# Patient Record
Sex: Male | Born: 1940 | Race: White | Hispanic: No | Smoking: Former smoker
Health system: Southern US, Community
[De-identification: ages and names within clinical notes are randomized; demographics above are authoritative.]

## PROBLEM LIST (undated history)

## (undated) DIAGNOSIS — G629 Polyneuropathy, unspecified: Secondary | ICD-10-CM

## (undated) DIAGNOSIS — E119 Type 2 diabetes mellitus without complications: Secondary | ICD-10-CM

## (undated) DIAGNOSIS — I739 Peripheral vascular disease, unspecified: Secondary | ICD-10-CM

## (undated) DIAGNOSIS — I441 Atrioventricular block, second degree: Secondary | ICD-10-CM

## (undated) DIAGNOSIS — E785 Hyperlipidemia, unspecified: Secondary | ICD-10-CM

## (undated) DIAGNOSIS — F329 Major depressive disorder, single episode, unspecified: Secondary | ICD-10-CM

## (undated) DIAGNOSIS — N400 Enlarged prostate without lower urinary tract symptoms: Secondary | ICD-10-CM

## (undated) DIAGNOSIS — N189 Chronic kidney disease, unspecified: Secondary | ICD-10-CM

## (undated) DIAGNOSIS — Z87440 Personal history of urinary (tract) infections: Secondary | ICD-10-CM

## (undated) DIAGNOSIS — R001 Bradycardia, unspecified: Secondary | ICD-10-CM

## (undated) DIAGNOSIS — M1990 Unspecified osteoarthritis, unspecified site: Secondary | ICD-10-CM

## (undated) DIAGNOSIS — N2581 Secondary hyperparathyroidism of renal origin: Secondary | ICD-10-CM

## (undated) DIAGNOSIS — E1121 Type 2 diabetes mellitus with diabetic nephropathy: Secondary | ICD-10-CM

## (undated) DIAGNOSIS — F32A Depression, unspecified: Secondary | ICD-10-CM

## (undated) DIAGNOSIS — M109 Gout, unspecified: Secondary | ICD-10-CM

## (undated) DIAGNOSIS — D631 Anemia in chronic kidney disease: Secondary | ICD-10-CM

## (undated) DIAGNOSIS — E079 Disorder of thyroid, unspecified: Secondary | ICD-10-CM

## (undated) DIAGNOSIS — E039 Hypothyroidism, unspecified: Secondary | ICD-10-CM

## (undated) HISTORY — DX: Atrioventricular block, second degree: I44.1

## (undated) HISTORY — DX: Unspecified osteoarthritis, unspecified site: M19.90

## (undated) HISTORY — DX: Secondary hyperparathyroidism of renal origin: N25.81

## (undated) HISTORY — DX: Chronic kidney disease, unspecified: N18.9

## (undated) HISTORY — PX: COLONOSCOPY: SHX174

## (undated) HISTORY — DX: Disorder of thyroid, unspecified: E07.9

## (undated) HISTORY — DX: Depression, unspecified: F32.A

## (undated) HISTORY — DX: Hyperlipidemia, unspecified: E78.5

## (undated) HISTORY — DX: Polyneuropathy, unspecified: G62.9

## (undated) HISTORY — DX: Bradycardia, unspecified: R00.1

## (undated) HISTORY — PX: EYE SURGERY: SHX253

## (undated) HISTORY — DX: Gout, unspecified: M10.9

## (undated) HISTORY — DX: Type 2 diabetes mellitus with diabetic nephropathy: E11.21

## (undated) HISTORY — PX: KNEE ARTHROSCOPY: SUR90

## (undated) HISTORY — DX: Type 2 diabetes mellitus without complications: E11.9

## (undated) HISTORY — DX: Hypothyroidism, unspecified: E03.9

## (undated) HISTORY — DX: Anemia in chronic kidney disease: D63.1

## (undated) HISTORY — DX: Peripheral vascular disease, unspecified: I73.9

## (undated) HISTORY — DX: Anemia in chronic kidney disease: N18.9

## (undated) HISTORY — DX: Personal history of urinary (tract) infections: Z87.440

---
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
## 2010-09-13 ENCOUNTER — Ambulatory Visit (HOSPITAL_COMMUNITY)
Admission: RE | Admit: 2010-09-13 | Discharge: 2010-09-13 | Payer: Self-pay | Source: Home / Self Care | Attending: Nephrology | Admitting: Nephrology

## 2011-02-09 ENCOUNTER — Ambulatory Visit: Payer: Self-pay | Admitting: Ophthalmology

## 2011-02-21 ENCOUNTER — Ambulatory Visit: Payer: Self-pay | Admitting: Ophthalmology

## 2012-05-15 DIAGNOSIS — N186 End stage renal disease: Secondary | ICD-10-CM | POA: Insufficient documentation

## 2012-05-15 DIAGNOSIS — N189 Chronic kidney disease, unspecified: Secondary | ICD-10-CM | POA: Insufficient documentation

## 2012-05-15 DIAGNOSIS — E039 Hypothyroidism, unspecified: Secondary | ICD-10-CM | POA: Insufficient documentation

## 2012-05-15 DIAGNOSIS — E875 Hyperkalemia: Secondary | ICD-10-CM | POA: Insufficient documentation

## 2012-05-15 DIAGNOSIS — H269 Unspecified cataract: Secondary | ICD-10-CM | POA: Insufficient documentation

## 2012-05-15 DIAGNOSIS — M109 Gout, unspecified: Secondary | ICD-10-CM | POA: Insufficient documentation

## 2012-05-15 DIAGNOSIS — G8929 Other chronic pain: Secondary | ICD-10-CM | POA: Insufficient documentation

## 2012-05-15 DIAGNOSIS — E119 Type 2 diabetes mellitus without complications: Secondary | ICD-10-CM | POA: Insufficient documentation

## 2012-05-15 DIAGNOSIS — E1122 Type 2 diabetes mellitus with diabetic chronic kidney disease: Secondary | ICD-10-CM | POA: Insufficient documentation

## 2012-05-15 HISTORY — DX: Unspecified cataract: H26.9

## 2012-07-11 ENCOUNTER — Other Ambulatory Visit: Payer: Self-pay | Admitting: Nephrology

## 2012-07-11 DIAGNOSIS — R748 Abnormal levels of other serum enzymes: Secondary | ICD-10-CM

## 2012-07-16 ENCOUNTER — Ambulatory Visit
Admission: RE | Admit: 2012-07-16 | Discharge: 2012-07-16 | Disposition: A | Discharge status: Home / Self Care | Payer: Medicare Other | Source: Ambulatory Visit | Attending: Nephrology | Admitting: Nephrology

## 2012-07-16 DIAGNOSIS — R748 Abnormal levels of other serum enzymes: Secondary | ICD-10-CM

## 2012-08-28 DIAGNOSIS — N529 Male erectile dysfunction, unspecified: Secondary | ICD-10-CM

## 2012-08-28 HISTORY — DX: Male erectile dysfunction, unspecified: N52.9

## 2012-10-17 DIAGNOSIS — E11319 Type 2 diabetes mellitus with unspecified diabetic retinopathy without macular edema: Secondary | ICD-10-CM | POA: Insufficient documentation

## 2012-10-29 DIAGNOSIS — M6208 Separation of muscle (nontraumatic), other site: Secondary | ICD-10-CM | POA: Insufficient documentation

## 2013-05-07 DIAGNOSIS — I10 Essential (primary) hypertension: Secondary | ICD-10-CM | POA: Insufficient documentation

## 2013-05-07 DIAGNOSIS — M1990 Unspecified osteoarthritis, unspecified site: Secondary | ICD-10-CM | POA: Insufficient documentation

## 2013-05-07 DIAGNOSIS — N186 End stage renal disease: Secondary | ICD-10-CM | POA: Insufficient documentation

## 2013-05-07 HISTORY — DX: Essential (primary) hypertension: I10

## 2013-09-17 DIAGNOSIS — F1190 Opioid use, unspecified, uncomplicated: Secondary | ICD-10-CM | POA: Insufficient documentation

## 2013-11-05 ENCOUNTER — Ambulatory Visit: Payer: Self-pay | Admitting: Podiatrist

## 2013-11-19 ENCOUNTER — Ambulatory Visit: Payer: Self-pay | Admitting: Podiatrist

## 2013-11-19 DIAGNOSIS — Z550 Illiteracy and low-level literacy: Secondary | ICD-10-CM | POA: Insufficient documentation

## 2013-11-19 HISTORY — DX: Illiteracy and low-level literacy: Z55.0

## 2013-11-26 ENCOUNTER — Ambulatory Visit (INDEPENDENT_AMBULATORY_CARE_PROVIDER_SITE_OTHER): Payer: Medicare Other | Admitting: Podiatrist

## 2013-11-26 ENCOUNTER — Encounter: Payer: Self-pay | Admitting: Podiatrist

## 2013-11-26 VITALS — BP 122/75 | HR 62 | Resp 16 | Ht 68.0 in | Wt 233.0 lb

## 2013-11-26 DIAGNOSIS — E1159 Type 2 diabetes mellitus with other circulatory complications: Secondary | ICD-10-CM

## 2013-11-26 DIAGNOSIS — M898X9 Other specified disorders of bone, unspecified site: Secondary | ICD-10-CM

## 2013-11-26 NOTE — Patient Instructions (Signed)
Diabetes and Foot Care Diabetes may cause you to have problems because of poor blood supply (circulation) to your feet and legs. This may cause the skin on your feet to become thinner, break easier, and heal more slowly. Your skin may become dry, and the skin may peel and crack. You may also have nerve damage in your legs and feet causing decreased feeling in them. You may not notice minor injuries to your feet that could lead to infections or more serious problems. Taking care of your feet is one of the most important things you can do for yourself.  HOME CARE INSTRUCTIONS  Wear shoes at all times, even in the house. Do not go barefoot. Bare feet are easily injured.  Check your feet daily for blisters, cuts, and redness. If you cannot see the bottom of your feet, use a mirror or ask someone for help.  Wash your feet with warm water (do not use hot water) and mild soap. Then pat your feet and the areas between your toes until they are completely dry. Do not soak your feet as this can dry your skin.  Apply a moisturizing lotion or petroleum jelly (that does not contain alcohol and is unscented) to the skin on your feet and to dry, brittle toenails. Do not apply lotion between your toes.  Trim your toenails straight across. Do not dig under them or around the cuticle. File the edges of your nails with an emery board or nail file.  Do not cut corns or calluses or try to remove them with medicine.  Wear clean socks or stockings every day. Make sure they are not too tight. Do not wear knee-high stockings since they may decrease blood flow to your legs.  Wear shoes that fit properly and have enough cushioning. To break in new shoes, wear them for just a few hours a day. This prevents you from injuring your feet. Always look in your shoes before you put them on to be sure there are no objects inside.  Do not cross your legs. This may decrease the blood flow to your feet.  If you find a minor scrape,  cut, or break in the skin on your feet, keep it and the skin around it clean and dry. These areas may be cleansed with mild soap and water. Do not cleanse the area with peroxide, alcohol, or iodine.  When you remove an adhesive bandage, be sure not to damage the skin around it.  If you have a wound, look at it several times a day to make sure it is healing.  Do not use heating pads or hot water bottles. They may burn your skin. If you have lost feeling in your feet or legs, you may not know it is happening until it is too late.  Make sure your health care provider performs a complete foot exam at least annually or more often if you have foot problems. Report any cuts, sores, or bruises to your health care provider immediately. SEEK MEDICAL CARE IF:   You have an injury that is not healing.  You have cuts or breaks in the skin.  You have an ingrown nail.  You notice redness on your legs or feet.  You feel burning or tingling in your legs or feet.  You have pain or cramps in your legs and feet.  Your legs or feet are numb.  Your feet always feel cold. SEEK IMMEDIATE MEDICAL CARE IF:   There is increasing redness,   swelling, or pain in or around a wound.  There is a red line that goes up your leg.  Pus is coming from a wound.  You develop a fever or as directed by your health care provider.  You notice a bad smell coming from an ulcer or wound. Document Released: 08/24/2000 Document Revised: 04/29/2013 Document Reviewed: 02/03/2013 Exit[employer] Patient Information 2014 [employer].  

## 2013-11-26 NOTE — Progress Notes (Signed)
   Subjective:    Patient ID: Frank [name], male    DOB: Dec 25, 1940, 73 y.o.   MRN: [id number]  HPI Comments: Diabetic foot care. Want to have the feet checked. My doctor suggested to get set up with a doctor.  Diabetic since 1990. Blood sugar this morning was 112.   Diabetes      Review of Systems  Endocrine:       Diabetic   Hematological: Bruises/bleeds easily.  All other systems reviewed and are negative.       Objective:   Physical Exam Vascular status intact at 2/4 pt bilateral, 1/4 dp bilateral.  Neurological sensation intact at 5/5 sites right and 4/5 sites left via SWMF 5.07 Hallux limitus noted bilateral.  Posterior calcaneal spurring is present bilateral with pump bumps/exostosis present bilateral.  Pre ulcerative callus submetatarsal 1 bilateral.  Left hallux nail has been permanently removed in the past.  All remaining  toenails are thick, discolored, dystrophic.  He trimmed them up last night and he has several areas where he got too close to the skin.  No signs of infection today but I do recommend them being trimmed in the future by a foot specialist.  Contracture of 2nd digit is noted bilateral Most recent ABI's by Dr. Lucky Cowboy 05/2012  1.36 left; 1.17 Right        Assessment & Plan:  Diabetes with early neuropathy, toenail deformity, hallux limitus, haglunds, pre ulcerative callus submet 1 left.  Recommended diabetic shoes with inserts to prevent ulceration.  Will return in 3 months for nail debridement and foot check

## 2015-02-10 DIAGNOSIS — M1A09X0 Idiopathic chronic gout, multiple sites, without tophus (tophi): Secondary | ICD-10-CM

## 2015-02-10 DIAGNOSIS — N5203 Combined arterial insufficiency and corporo-venous occlusive erectile dysfunction: Secondary | ICD-10-CM | POA: Insufficient documentation

## 2015-02-10 HISTORY — DX: Idiopathic chronic gout, multiple sites, without tophus (tophi): M1A.09X0

## 2015-08-01 DIAGNOSIS — E785 Hyperlipidemia, unspecified: Secondary | ICD-10-CM | POA: Insufficient documentation

## 2015-11-28 DIAGNOSIS — E1142 Type 2 diabetes mellitus with diabetic polyneuropathy: Secondary | ICD-10-CM | POA: Insufficient documentation

## 2015-11-28 DIAGNOSIS — Z01818 Encounter for other preprocedural examination: Secondary | ICD-10-CM | POA: Insufficient documentation

## 2016-07-04 DIAGNOSIS — M7989 Other specified soft tissue disorders: Secondary | ICD-10-CM | POA: Insufficient documentation

## 2016-08-13 DIAGNOSIS — M19071 Primary osteoarthritis, right ankle and foot: Secondary | ICD-10-CM | POA: Insufficient documentation

## 2016-10-18 DIAGNOSIS — L299 Pruritus, unspecified: Secondary | ICD-10-CM | POA: Insufficient documentation

## 2017-07-25 ENCOUNTER — Other Ambulatory Visit: Payer: Self-pay

## 2017-07-25 DIAGNOSIS — N185 Chronic kidney disease, stage 5: Secondary | ICD-10-CM

## 2017-07-25 DIAGNOSIS — Z01810 Encounter for preprocedural cardiovascular examination: Secondary | ICD-10-CM

## 2017-07-26 ENCOUNTER — Encounter: Payer: Self-pay | Admitting: Vascular Surgery

## 2017-09-11 ENCOUNTER — Other Ambulatory Visit (HOSPITAL_COMMUNITY): Payer: Medicare Other

## 2017-09-11 ENCOUNTER — Encounter: Payer: Medicare Other | Admitting: Vascular Surgery

## 2017-09-11 ENCOUNTER — Encounter (HOSPITAL_COMMUNITY): Payer: Medicare Other

## 2017-11-01 ENCOUNTER — Encounter: Payer: Self-pay | Admitting: Vascular Surgery

## 2017-11-01 ENCOUNTER — Other Ambulatory Visit: Payer: Self-pay | Admitting: *Deleted

## 2017-11-01 ENCOUNTER — Encounter: Payer: Self-pay | Admitting: *Deleted

## 2017-11-01 ENCOUNTER — Ambulatory Visit (HOSPITAL_COMMUNITY)
Admission: RE | Admit: 2017-11-01 | Discharge: 2017-11-01 | Disposition: A | Discharge status: Home / Self Care | Payer: Medicare Other | Source: Ambulatory Visit | Attending: Vascular Surgery | Admitting: Vascular Surgery

## 2017-11-01 ENCOUNTER — Ambulatory Visit (INDEPENDENT_AMBULATORY_CARE_PROVIDER_SITE_OTHER)
Admission: RE | Admit: 2017-11-01 | Discharge: 2017-11-01 | Disposition: A | Discharge status: Home / Self Care | Payer: Medicare Other | Source: Ambulatory Visit | Attending: Vascular Surgery | Admitting: Vascular Surgery

## 2017-11-01 ENCOUNTER — Ambulatory Visit (INDEPENDENT_AMBULATORY_CARE_PROVIDER_SITE_OTHER): Payer: Medicare Other | Admitting: Vascular Surgery

## 2017-11-01 VITALS — BP 145/71 | HR 74 | Resp 20 | Ht 68.0 in | Wt 192.4 lb

## 2017-11-01 DIAGNOSIS — I120 Hypertensive chronic kidney disease with stage 5 chronic kidney disease or end stage renal disease: Secondary | ICD-10-CM | POA: Insufficient documentation

## 2017-11-01 DIAGNOSIS — N185 Chronic kidney disease, stage 5: Secondary | ICD-10-CM | POA: Diagnosis not present

## 2017-11-01 DIAGNOSIS — Z01810 Encounter for preprocedural cardiovascular examination: Secondary | ICD-10-CM | POA: Diagnosis present

## 2017-11-01 DIAGNOSIS — N189 Chronic kidney disease, unspecified: Secondary | ICD-10-CM | POA: Diagnosis not present

## 2017-11-01 DIAGNOSIS — Z87891 Personal history of nicotine dependence: Secondary | ICD-10-CM | POA: Insufficient documentation

## 2017-11-01 NOTE — Progress Notes (Signed)
Patient ID: Frank [name], male   DOB: 1940/11/25, 77 y.o.   MRN: [id number]  Reason for Consult: New Patient (Initial Visit) (eval for AVF - ref by Dr. Justin Mend)   Referred by Glennie Hawk, MD  Subjective:     HPI:  Frank [name] is a 77 y.o. male presents for evaluation of dialysis access creation.  He is never been on dialysis before.  He does not have upper extremity surgery or pacemaker placement or central catheters.  He is right-handed.  He does not take blood thinners.  Past Medical History:  Diagnosis Date  . Anemia of chronic kidney failure   . Bradycardia   . Chronic kidney disease   . Diabetes mellitus without complication ([place])   . Diabetic nephropathy ([place])   . Gout   . Gout   . Hyperlipidemia   . Hypothyroidism   . Peripheral neuropathy   . Peripheral vascular disease ([place])   . Secondary hyperparathyroidism ([place])    Renal  . Thyroid disease    Family History  Problem Relation Age of Onset  . Diabetes Mother   . Kidney disease Mother   . Liver disease Mother   . Heart Problems Mother    Past Surgical History:  Procedure Laterality Date  . ARTHROPLASTY Left    Knee  . EYE SURGERY     cataract surgery    Short Social History:  Social History   Tobacco Use  . Smoking status: Former Smoker    Years: 30.00    Last attempt to quit: 1994    Years since quitting: 25.1  . Smokeless tobacco: Never Used  Substance Use Topics  . Alcohol use: No    Allergies  Allergen Reactions  . Other     Iv dyes     Current Outpatient Medications  Medication Sig Dispense Refill  . allopurinol (ZYLOPRIM) 100 MG tablet     . aspirin EC 81 MG tablet Take 81 mg daily by mouth.    Marland Kitchen atorvastatin (LIPITOR) 20 MG tablet Take 20 mg daily by mouth.    . cimetidine (TAGAMET) 200 MG tablet Take 200 mg 2 (two) times daily by mouth.    . furosemide (LASIX) 20 MG tablet     . gabapentin (NEURONTIN) 100 MG capsule Take 300 mg by mouth at bedtime as needed.     Marland Kitchen  HYDROcodone-acetaminophen (NORCO) 10-325 MG per tablet Take 0.5 tablets by mouth.     . levothyroxine (SYNTHROID, LEVOTHROID) 200 MCG tablet 224 mcg.     . multivitamin (RENA-VIT) TABS tablet Take 1 tablet daily by mouth.    . tamsulosin (FLOMAX) 0.4 MG CAPS capsule Take 0.4 mg by mouth.    Marland Kitchen atenolol (TENORMIN) 50 MG tablet     . colchicine 0.6 MG tablet Take 0.6 mg daily by mouth.    Marland Kitchen glimepiride (AMARYL) 4 MG tablet     . hydrOXYzine (ATARAX/VISTARIL) 25 MG tablet Take 25 mg 3 (three) times daily as needed by mouth.     No current facility-administered medications for this visit.     Review of Systems  Constitutional:  Constitutional negative. HENT: HENT negative.  Eyes: Eyes negative.  Respiratory: Respiratory negative.  Cardiovascular: Cardiovascular negative.  Skin: Skin negative.  Neurological: Neurological negative. Hematologic: Hematologic/lymphatic negative.  Psychiatric: Psychiatric negative.        Objective:  Objective   Vitals:   11/01/17 1004 11/01/17 1007  BP: (!) 150/79 (!) 145/71  Pulse: 74  Resp: 20   SpO2: 98%   Weight: 192 lb 6.4 oz (87.3 kg)   Height: 5\' 8"  (1.727 m)    Body mass index is 29.25 kg/m.  Physical Exam  Constitutional: He is oriented to person, place, and time. He appears well-developed.  HENT:  Head: Normocephalic.  Eyes: Pupils are equal, round, and reactive to light.  Neck: Normal range of motion. Neck supple.  Cardiovascular: Normal rate.  Pulses:      Carotid pulses are 2+ on the right side, and 2+ on the left side.      Radial pulses are 2+ on the right side, and 2+ on the left side.  Abdominal: Soft.  Musculoskeletal: Normal range of motion. He exhibits no edema.  Large appearing compressible cephalic vein in left forearm  Neurological: He is alert and oriented to person, place, and time.  Skin: Skin is warm and dry.  Psychiatric: He has a normal mood and affect. His behavior is normal. Thought content normal.     Data: I have interpreted his bilateral upper extremity arterial duplex which have triphasic waveforms bilaterally.  Brachial artery on the left is 0.5 cm and radial artery is 0.3 cm.  I have also interpreted his vein mapping which demonstrates suitable basilic vein on the left for fistula creation he would have a basilic or cephalic vein on the right.     Assessment/Plan:     77 year old male with chronic kidney disease here for evaluation placement dialysis access.  He does appear to have at least the basilic vein on the left which is his nondominant arm but also on physical exam appears to possibly have a forearm cephalic vein that could be used.  We will use ultrasound at the time of surgery to determine the best course of action but he does have very strong palpable brachial and radial pulses on the left arm.  He does demonstrate good understanding we will get him set up today.     [name] MD [facility]

## 2017-11-04 ENCOUNTER — Encounter: Payer: Self-pay | Admitting: Nephrology

## 2017-11-18 ENCOUNTER — Encounter (HOSPITAL_COMMUNITY): Payer: Self-pay | Admitting: *Deleted

## 2017-11-18 ENCOUNTER — Other Ambulatory Visit: Payer: Self-pay

## 2017-11-18 NOTE — Progress Notes (Signed)
Pre-op instructions give to patient, no further questions asked,  Instructed patient to check his sugar the morning of surgery and if less than 7- to treat with 1/2 cup of apple juice  Checks 1 daily at home fasting 100-115

## 2017-11-19 ENCOUNTER — Encounter (HOSPITAL_COMMUNITY)
Admission: RE | Disposition: A | Discharge status: Home / Self Care | Payer: Self-pay | Source: Ambulatory Visit | Attending: Vascular Surgery

## 2017-11-19 ENCOUNTER — Ambulatory Visit (HOSPITAL_COMMUNITY)
Admission: RE | Admit: 2017-11-19 | Discharge: 2017-11-19 | Disposition: A | Discharge status: Home / Self Care | Payer: Medicare Other | Source: Ambulatory Visit | Attending: Vascular Surgery | Admitting: Vascular Surgery

## 2017-11-19 ENCOUNTER — Other Ambulatory Visit: Payer: Self-pay

## 2017-11-19 ENCOUNTER — Ambulatory Visit (HOSPITAL_COMMUNITY): Payer: Medicare Other | Admitting: Anesthesiology

## 2017-11-19 ENCOUNTER — Encounter (HOSPITAL_COMMUNITY): Payer: Self-pay | Admitting: *Deleted

## 2017-11-19 DIAGNOSIS — I4510 Unspecified right bundle-branch block: Secondary | ICD-10-CM | POA: Diagnosis not present

## 2017-11-19 DIAGNOSIS — N185 Chronic kidney disease, stage 5: Secondary | ICD-10-CM

## 2017-11-19 DIAGNOSIS — E1122 Type 2 diabetes mellitus with diabetic chronic kidney disease: Secondary | ICD-10-CM | POA: Diagnosis not present

## 2017-11-19 DIAGNOSIS — Z7984 Long term (current) use of oral hypoglycemic drugs: Secondary | ICD-10-CM | POA: Diagnosis not present

## 2017-11-19 DIAGNOSIS — E785 Hyperlipidemia, unspecified: Secondary | ICD-10-CM | POA: Insufficient documentation

## 2017-11-19 DIAGNOSIS — E211 Secondary hyperparathyroidism, not elsewhere classified: Secondary | ICD-10-CM | POA: Insufficient documentation

## 2017-11-19 DIAGNOSIS — Z79899 Other long term (current) drug therapy: Secondary | ICD-10-CM | POA: Diagnosis not present

## 2017-11-19 DIAGNOSIS — M109 Gout, unspecified: Secondary | ICD-10-CM | POA: Diagnosis not present

## 2017-11-19 DIAGNOSIS — N189 Chronic kidney disease, unspecified: Secondary | ICD-10-CM | POA: Insufficient documentation

## 2017-11-19 DIAGNOSIS — E039 Hypothyroidism, unspecified: Secondary | ICD-10-CM | POA: Diagnosis not present

## 2017-11-19 DIAGNOSIS — E1140 Type 2 diabetes mellitus with diabetic neuropathy, unspecified: Secondary | ICD-10-CM | POA: Diagnosis not present

## 2017-11-19 DIAGNOSIS — E1151 Type 2 diabetes mellitus with diabetic peripheral angiopathy without gangrene: Secondary | ICD-10-CM | POA: Insufficient documentation

## 2017-11-19 DIAGNOSIS — I129 Hypertensive chronic kidney disease with stage 1 through stage 4 chronic kidney disease, or unspecified chronic kidney disease: Secondary | ICD-10-CM | POA: Diagnosis not present

## 2017-11-19 DIAGNOSIS — D631 Anemia in chronic kidney disease: Secondary | ICD-10-CM | POA: Diagnosis not present

## 2017-11-19 DIAGNOSIS — Z7982 Long term (current) use of aspirin: Secondary | ICD-10-CM | POA: Diagnosis not present

## 2017-11-19 DIAGNOSIS — Z87891 Personal history of nicotine dependence: Secondary | ICD-10-CM | POA: Insufficient documentation

## 2017-11-19 DIAGNOSIS — Z91041 Radiographic dye allergy status: Secondary | ICD-10-CM | POA: Insufficient documentation

## 2017-11-19 HISTORY — PX: AV FISTULA PLACEMENT: SHX1204

## 2017-11-19 LAB — POCT I-STAT 4, (NA,K, GLUC, HGB,HCT)
Glucose, Bld: 114 mg/dL — ABNORMAL HIGH (ref 65–99)
HCT: 32 % — ABNORMAL LOW (ref 39.0–52.0)
Hemoglobin: 10.9 g/dL — ABNORMAL LOW (ref 13.0–17.0)
Potassium: 3.5 mmol/L (ref 3.5–5.1)
Sodium: 142 mmol/L (ref 135–145)

## 2017-11-19 LAB — HEMOGLOBIN A1C
Hgb A1c MFr Bld: 6.1 % — ABNORMAL HIGH (ref 4.8–5.6)
Mean Plasma Glucose: 128.37 mg/dL

## 2017-11-19 SURGERY — ARTERIOVENOUS (AV) FISTULA CREATION
Anesthesia: Monitor Anesthesia Care | Site: Arm Lower | Laterality: Left

## 2017-11-19 MED ORDER — DEXAMETHASONE SODIUM PHOSPHATE 10 MG/ML IJ SOLN
INTRAMUSCULAR | Status: AC
  Filled 2017-11-19: qty 1

## 2017-11-19 MED ORDER — 0.9 % SODIUM CHLORIDE (POUR BTL) OPTIME
TOPICAL | Status: DC | PRN
  Administered 2017-11-19: 1000 mL

## 2017-11-19 MED ORDER — CHLORHEXIDINE GLUCONATE 4 % EX LIQD: 60.0000 mL | Freq: Once | CUTANEOUS | Status: DC

## 2017-11-19 MED ORDER — DEXAMETHASONE SODIUM PHOSPHATE 4 MG/ML IJ SOLN
INTRAMUSCULAR | Status: DC | PRN
  Administered 2017-11-19: 4 mg via INTRAVENOUS

## 2017-11-19 MED ORDER — TRAMADOL HCL 50 MG PO TABS: 50.0000 mg | ORAL_TABLET | Freq: Four times a day (QID) | ORAL | 0 refills | Status: DC | PRN

## 2017-11-19 MED ORDER — LIDOCAINE-EPINEPHRINE (PF) 1 %-1:200000 IJ SOLN
INTRAMUSCULAR | Status: DC | PRN
  Administered 2017-11-19: 30 mL

## 2017-11-19 MED ORDER — CEFAZOLIN SODIUM-DEXTROSE 2-4 GM/100ML-% IV SOLN
INTRAVENOUS | Status: AC
  Filled 2017-11-19: qty 100

## 2017-11-19 MED ORDER — CEFAZOLIN SODIUM-DEXTROSE 2-4 GM/100ML-% IV SOLN
2.0000 g | INTRAVENOUS | Status: AC
  Administered 2017-11-19: 2 g via INTRAVENOUS

## 2017-11-19 MED ORDER — PROPOFOL 10 MG/ML IV BOLUS
INTRAVENOUS | Status: AC
  Filled 2017-11-19: qty 20

## 2017-11-19 MED ORDER — PROPOFOL 500 MG/50ML IV EMUL
INTRAVENOUS | Status: DC | PRN
  Administered 2017-11-19: 75 ug/kg/min via INTRAVENOUS

## 2017-11-19 MED ORDER — ONDANSETRON HCL 4 MG/2ML IJ SOLN
INTRAMUSCULAR | Status: DC | PRN
  Administered 2017-11-19: 4 mg via INTRAVENOUS

## 2017-11-19 MED ORDER — ONDANSETRON HCL 4 MG/2ML IJ SOLN
INTRAMUSCULAR | Status: AC
  Filled 2017-11-19: qty 2

## 2017-11-19 MED ORDER — MIDAZOLAM HCL 5 MG/5ML IJ SOLN
INTRAMUSCULAR | Status: DC | PRN
  Administered 2017-11-19 (×2): 1 mg via INTRAVENOUS

## 2017-11-19 MED ORDER — MIDAZOLAM HCL 2 MG/2ML IJ SOLN
INTRAMUSCULAR | Status: AC
  Filled 2017-11-19: qty 2

## 2017-11-19 MED ORDER — FENTANYL CITRATE (PF) 250 MCG/5ML IJ SOLN
INTRAMUSCULAR | Status: AC
  Filled 2017-11-19: qty 5

## 2017-11-19 MED ORDER — FENTANYL CITRATE (PF) 100 MCG/2ML IJ SOLN
INTRAMUSCULAR | Status: DC | PRN
  Administered 2017-11-19: 50 ug via INTRAVENOUS

## 2017-11-19 MED ORDER — SODIUM CHLORIDE 0.9 % IV SOLN
INTRAVENOUS | Status: DC
  Administered 2017-11-19: 07:00:00 via INTRAVENOUS

## 2017-11-19 MED ORDER — HEPARIN SODIUM (PORCINE) 5000 UNIT/ML IJ SOLN
INTRAMUSCULAR | Status: DC | PRN
  Administered 2017-11-19: 07:00:00

## 2017-11-19 MED ORDER — LIDOCAINE-EPINEPHRINE (PF) 1 %-1:200000 IJ SOLN
INTRAMUSCULAR | Status: AC
  Filled 2017-11-19: qty 30

## 2017-11-19 SURGICAL SUPPLY — 29 items
ARMBAND PINK RESTRICT EXTREMIT (MISCELLANEOUS) ×2 IMPLANT
CANISTER SUCT 3000ML PPV (MISCELLANEOUS) ×2 IMPLANT
CLIP VESOCCLUDE MED 6/CT (CLIP) ×2 IMPLANT
CLIP VESOCCLUDE SM WIDE 6/CT (CLIP) ×2 IMPLANT
COVER PROBE W GEL 5X96 (DRAPES) ×2 IMPLANT
DERMABOND ADVANCED (GAUZE/BANDAGES/DRESSINGS) ×1
DERMABOND ADVANCED .7 DNX12 (GAUZE/BANDAGES/DRESSINGS) ×1 IMPLANT
ELECT REM PT RETURN 9FT ADLT (ELECTROSURGICAL) ×2
ELECTRODE REM PT RTRN 9FT ADLT (ELECTROSURGICAL) ×1 IMPLANT
GLOVE BIO SURGEON STRL SZ7 (GLOVE) ×4 IMPLANT
GLOVE BIO SURGEON STRL SZ7.5 (GLOVE) ×2 IMPLANT
GLOVE BIOGEL PI IND STRL 6.5 (GLOVE) ×1 IMPLANT
GLOVE BIOGEL PI INDICATOR 6.5 (GLOVE) ×1
GOWN STRL REUS W/ TWL LRG LVL3 (GOWN DISPOSABLE) IMPLANT
GOWN STRL REUS W/ TWL XL LVL3 (GOWN DISPOSABLE) ×4 IMPLANT
GOWN STRL REUS W/TWL LRG LVL3 (GOWN DISPOSABLE)
GOWN STRL REUS W/TWL XL LVL3 (GOWN DISPOSABLE) ×4
KIT BASIN OR (CUSTOM PROCEDURE TRAY) ×2 IMPLANT
KIT ROOM TURNOVER OR (KITS) ×2 IMPLANT
NS IRRIG 1000ML POUR BTL (IV SOLUTION) ×2 IMPLANT
PACK CV ACCESS (CUSTOM PROCEDURE TRAY) ×2 IMPLANT
PAD ARMBOARD 7.5X6 YLW CONV (MISCELLANEOUS) ×4 IMPLANT
SUT MNCRL AB 4-0 PS2 18 (SUTURE) ×2 IMPLANT
SUT PROLENE 6 0 BV (SUTURE) ×2 IMPLANT
SUT VIC AB 3-0 SH 27 (SUTURE) ×1
SUT VIC AB 3-0 SH 27X BRD (SUTURE) ×1 IMPLANT
TOWEL GREEN STERILE (TOWEL DISPOSABLE) ×2 IMPLANT
UNDERPAD 30X30 (UNDERPADS AND DIAPERS) ×2 IMPLANT
WATER STERILE IRR 1000ML POUR (IV SOLUTION) ×2 IMPLANT

## 2017-11-19 NOTE — Anesthesia Preprocedure Evaluation (Addendum)
Anesthesia Evaluation  Patient identified by MRN, date of birth, ID band Patient awake    Reviewed: Allergy & Precautions, NPO status   History of Anesthesia Complications Negative for: history of anesthetic complications  Airway Mallampati: II  TM Distance: >3 FB Neck ROM: Full    Dental  (+) Upper Dentures, Lower Dentures, Dental Advisory Given   Pulmonary former smoker,    breath sounds clear to auscultation       Cardiovascular + Peripheral Vascular Disease   Rhythm:Regular Rate:Normal     Neuro/Psych  Neuromuscular disease    GI/Hepatic negative GI ROS, Neg liver ROS,   Endo/Other  diabetesHypothyroidism   Renal/GU ESRFRenal disease     Musculoskeletal   Abdominal   Peds  Hematology   Anesthesia Other Findings   Reproductive/Obstetrics                           Anesthesia Physical Anesthesia Plan  ASA: III  Anesthesia Plan: MAC   Post-op Pain Management:    Induction:   PONV Risk Score and Plan: 2 and Treatment may vary due to age or medical condition, Dexamethasone and Ondansetron  Airway Management Planned: Simple Face Mask and Natural Airway  Additional Equipment:   Intra-op Plan:   Post-operative Plan:   Informed Consent: I have reviewed the patients History and Physical, chart, labs and discussed the procedure including the risks, benefits and alternatives for the proposed anesthesia with the patient or authorized representative who has indicated his/her understanding and acceptance.     Plan Discussed with: CRNA  Anesthesia Plan Comments:         Anesthesia Quick Evaluation

## 2017-11-19 NOTE — Anesthesia Procedure Notes (Signed)
Procedure Name: MAC Date/Time: 11/19/2017 7:40 AM Performed by: Jenne Campus, CRNA Pre-anesthesia Checklist: Patient identified, Emergency Drugs available, Suction available and Patient being monitored Oxygen Delivery Method: Simple face mask

## 2017-11-19 NOTE — Op Note (Signed)
    Patient name: Frank [name] MRN: [id number] DOB: 04/02/1941 Sex: male  11/19/2017 Pre-operative Diagnosis: chronic kidney disease Post-operative diagnosis:  Same Surgeon:  Eda Paschal. Donzetta Matters, MD Assistant: Arlee Muslim, PA Procedure Performed: Left radiocephalic avf  Indications: 77 year old male has chronic kidney disease now indicated for permanent access.  He has vein mapping demonstrating suitable vein on his entire right upper extremity and basilic vein on the left.  Physical exam demonstrates likely cephalic vein in the forearm for usable fistula creation.  Findings: The cephalic vein was 3 mm throughout the forearm although does dive deep into the antecubitum.  The radial artery was 3 mm external diameter.  Following fistula creation at the wrist there was a strong thrill palpable in the distal half of the forearm.   Procedure:  The patient was identified in the holding area and taken to the operating room where he was placed supine on the operating table and MAC anesthesia was induced.  He was sterilely prepped and draped in the left arm in the usual fashion given antibiotics and timeout was called.  We used ultrasound to evaluate his cephalic vein throughout his forearm which appeared adequate for fistula creation.  1% lidocaine with epinephrine was then instilled around the radial artery at the wrist as well as the cephalic vein and a longitudinal incision was made between both the vein and artery.  We first dissected out the vein and marked it for orientation.  We then turned our attention divided the deep fascia and placed a vessel loop around the artery.  The vein was then transected distally dilated up to 4 mm easily flushed with heparinized saline.  The artery was then clamped distally and proximally opened longitudinally and flushed with heparinized saline both directions.  The vein was then sewn end-to-side to the artery with 6-0 Prolene suture.  Prior to completing we allowed  flushing maneuvers in all directions.  Upon completion there was a thrill confirmed with Doppler throughout the vein we could traced up to the near the antecubitum with palpation and Doppler.  I mobilized the vein within the wound and did clip an extra branch.  The vein maintained strong thrill.  Wound was irrigated closed in 2 layers with Vicryl Monocryl.  Dermabond was placed level the skin.  Patient tolerated procedure well without immediate complication.  All counts were correct at completion.  Next  EBL 10 cc.   Ocean Schildt C. Donzetta Matters, MD [facility] [place] Office: [phone] Pager: [phone]

## 2017-11-19 NOTE — Transfer of Care (Signed)
Immediate Anesthesia Transfer of Care Note  Patient: Frank [name]  Procedure(s) Performed: ARTERIOVENOUS (AV) FISTULA CREATION (Left Arm Lower)  Patient Location: PACU  Anesthesia Type:MAC  Level of Consciousness: awake, oriented and patient cooperative  Airway & Oxygen Therapy: Patient Spontanous Breathing and Patient connected to nasal cannula oxygen  Post-op Assessment: Report given to RN and Post -op Vital signs reviewed and stable  Post vital signs: Reviewed  Last Vitals:  Vitals:   11/19/17 0626  BP: (!) 137/56  Pulse: 65  Resp: 20  Temp: 36.7 C  SpO2: 98%    Last Pain:  Vitals:   11/19/17 0626  TempSrc: Oral      Patients Stated Pain Goal: 2 (29/47/65 4650)  Complications: No apparent anesthesia complications

## 2017-11-19 NOTE — Anesthesia Postprocedure Evaluation (Signed)
Anesthesia Post Note  Patient: Frank [name]  Procedure(s) Performed: ARTERIOVENOUS (AV) FISTULA CREATION (Left Arm Lower)     Patient location during evaluation: PACU Anesthesia Type: MAC Level of consciousness: awake and alert Pain management: pain level controlled Vital Signs Assessment: post-procedure vital signs reviewed and stable Respiratory status: spontaneous breathing, nonlabored ventilation, respiratory function stable and patient connected to nasal cannula oxygen Cardiovascular status: stable and blood pressure returned to baseline Postop Assessment: no apparent nausea or vomiting Anesthetic complications: no    Last Vitals:  Vitals:   11/19/17 0915 11/19/17 0925  BP:  119/63  Pulse: (!) 59 61  Resp: 14 16  Temp:    SpO2: 97% 98%    Last Pain:  Vitals:   11/19/17 0925  TempSrc:   PainSc: 0-No pain                 Trystin Hargrove,JAMES TERRILL

## 2017-11-19 NOTE — Discharge Instructions (Signed)
° °  [facility] [place] ° °Discharge Instructions ° °AV Fistula or Graft Surgery for Dialysis Access ° °Please refer to the following instructions for your post-procedure care. Your surgeon or physician assistant will discuss any changes with you. ° °Activity ° °You may drive the day following your surgery, if you are comfortable and no longer taking prescription pain medication. Resume full activity as the soreness in your incision resolves. ° °Bathing/Showering ° °You may shower after you go home. Keep your incision dry for 48 hours. Do not soak in a bathtub, hot tub, or swim until the incision heals completely. You may not shower if you have a hemodialysis catheter. ° °Incision Care ° °Clean your incision with mild soap and water after 48 hours. Pat the area dry with a clean towel. You do not need a bandage unless otherwise instructed. Do not apply any ointments or creams to your incision. You may have skin glue on your incision. Do not peel it off. It will come off on its own in about one week. Your arm may swell a bit after surgery. To reduce swelling use pillows to elevate your arm so it is above your heart. Your doctor will tell you if you need to lightly wrap your arm with an ACE bandage. ° °Diet ° °Resume your normal diet. There are not special food restrictions following this procedure. In order to heal from your surgery, it is CRITICAL to get adequate nutrition. Your body requires vitamins, minerals, and protein. Vegetables are the best source of vitamins and minerals. Vegetables also provide the perfect balance of protein. Processed food has little nutritional value, so try to avoid this. ° °Medications ° °Resume taking all of your medications. If your incision is causing pain, you may take over-the counter pain relievers such as acetaminophen (Tylenol). If you were prescribed a stronger pain medication, please be aware these medications can cause nausea and constipation. Prevent  nausea by taking the medication with a snack or meal. Avoid constipation by drinking plenty of fluids and eating foods with high amount of fiber, such as fruits, vegetables, and grains. Do not take Tylenol if you are taking prescription pain medications. ° ° ° ° °Follow up °Your surgeon may want to see you in the office following your access surgery. If so, this will be arranged at the time of your surgery. ° °Please call us immediately for any of the following conditions: ° °Increased pain, redness, drainage (pus) from your incision site °Fever of 101 degrees or higher °Severe or worsening pain at your incision site °Hand pain or numbness. ° °Reduce your risk of vascular disease: ° °Stop smoking. If you would like help, call [facility] at 1-800-QUIT-NOW (1-800-784-8669) or [place] at 336-586-4000 ° °Manage your cholesterol °Maintain a desired weight °Control your diabetes °Keep your blood pressure down ° °Dialysis ° °It will take several weeks to several months for your new dialysis access to be ready for use. Your surgeon will determine when it is OK to use it. Your nephrologist will continue to direct your dialysis. You can continue to use your Permcath until your new access is ready for use. ° °If you have any questions, please call the office at [phone]. ° °

## 2017-11-19 NOTE — H&P (Signed)
   History and Physical Update  The patient was interviewed and re-examined.  The patient's previous History and Physical has been reviewed and is unchanged from recent office visit. Plan for left arm avf, will determine vein to be used with [place] in the OR.   Breon Diss C. Donzetta Matters, MD [facility] [place] Office: [phone] Pager: [phone]   11/19/2017, 7:26 AM

## 2017-11-20 ENCOUNTER — Telehealth: Payer: Self-pay | Admitting: Vascular Surgery

## 2017-11-20 ENCOUNTER — Encounter (HOSPITAL_COMMUNITY): Payer: Self-pay | Admitting: Vascular Surgery

## 2017-11-20 NOTE — Telephone Encounter (Signed)
Spoke to pt and confirmed appt. Mailed letter. °

## 2017-11-20 NOTE — Telephone Encounter (Signed)
-----   Message from Mena Goes, RN sent at 11/19/2017  8:44 AM EDT ----- Regarding: 4-6 weeks with duplex   ----- Message ----- From: Iline Oven Sent: 11/19/2017   8:30 AM To: [facility] Charge Pool  Can you schedule this pt for fistula duplex and to see Dr. Donzetta Matters in about 4-6 weeks.  PO L radiocephalic. Thanks, [facility]

## 2017-12-10 ENCOUNTER — Other Ambulatory Visit: Payer: Self-pay

## 2017-12-10 DIAGNOSIS — N185 Chronic kidney disease, stage 5: Secondary | ICD-10-CM

## 2017-12-18 ENCOUNTER — Ambulatory Visit (INDEPENDENT_AMBULATORY_CARE_PROVIDER_SITE_OTHER): Payer: Medicare Other | Admitting: Internal Medicine

## 2017-12-18 ENCOUNTER — Encounter: Payer: Self-pay | Admitting: Internal Medicine

## 2017-12-18 VITALS — BP 132/70 | HR 74 | Ht 68.0 in | Wt 193.0 lb

## 2017-12-18 DIAGNOSIS — R9431 Abnormal electrocardiogram [ECG] [EKG]: Secondary | ICD-10-CM | POA: Diagnosis not present

## 2017-12-18 DIAGNOSIS — I440 Atrioventricular block, first degree: Secondary | ICD-10-CM | POA: Diagnosis not present

## 2017-12-18 NOTE — Patient Instructions (Signed)

## 2017-12-18 NOTE — Progress Notes (Signed)
Electrophysiology Office Note   Date:  12/18/2017   ID:  Frank [name], DOB October 07, 1940, MRN [id number]  PCP:  Glennie Hawk, MD  Cardiologist:  none Primary Electrophysiologist: Thompson Grayer, MD    CC: AV block   History of Present Illness: Frank [name] is a 77 y.o. male who presents today for electrophysiology evaluation.   He is self referred for further evaluation of first degree AV block.  He has chronic renal disease and recently had fistula placed in anticipating of HD.  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 He is asymptomatic. Today, he denies symptoms of palpitations, chest pain, shortness of breath, orthopnea, PND, lower extremity edema, claudication, dizziness, presyncope, syncope, bleeding, or neurologic sequela. The patient is tolerating medications without difficulties and is otherwise without complaint today.    Past Medical History:  Diagnosis Date  . Anemia of chronic kidney failure   . Bradycardia   . Chronic kidney disease   . Diabetes mellitus without complication (HCC)    diet controlled   . Diabetic nephropathy ([place])   . Gout   . Gout   . Hyperlipidemia   . Hypothyroidism   . Peripheral neuropathy   . Peripheral vascular disease ([place])   . Secondary hyperparathyroidism ([place])    Renal  . Thyroid disease    Past Surgical History:  Procedure Laterality Date  . AV FISTULA PLACEMENT Left 11/19/2017   Procedure: ARTERIOVENOUS (AV) FISTULA CREATION;  Surgeon: [name], MD;  Location: [facility];  Service: Vascular;  Laterality: Left;  . COLONOSCOPY    . EYE SURGERY     cataract surgery  . KNEE ARTHROSCOPY Left    Knee     Current Outpatient Medications  Medication Sig Dispense Refill  . allopurinol (ZYLOPRIM) 100 MG tablet Take 100 mg by mouth daily.     Marland Kitchen aspirin EC 81 MG tablet Take 81 mg daily by mouth.    Marland Kitchen atorvastatin (LIPITOR) 20 MG tablet Take 20 mg daily by mouth.     . cimetidine (TAGAMET) 200 MG tablet Take 200 mg by mouth daily as needed (for pain).     . furosemide (LASIX) 20 MG tablet Take 20 mg by mouth daily.     Marland Kitchen gabapentin (NEURONTIN) 100 MG capsule Take 300 mg by mouth every evening.     Marland Kitchen HYDROcodone-acetaminophen (NORCO) 10-325 MG per tablet Take 0.5 tablets by mouth every 4 (four) hours as needed for moderate pain.     Marland Kitchen levothyroxine (SYNTHROID, LEVOTHROID) 112 MCG tablet Take 224 mcg by mouth daily before breakfast.     . multivitamin (RENA-VIT) TABS tablet Take 1 tablet daily by mouth.    . tamsulosin (FLOMAX) 0.4 MG CAPS capsule Take 0.4 mg by mouth daily.     . Zinc Oxide (BALMEX EX) Apply 1 application topically daily.     No current facility-administered medications for this visit.     Allergies:   Ace inhibitors; Angiotensin receptor blockers; Iodine-131; and Other   Social History:  The patient  reports that he quit smoking about 25 years ago. He quit after 30.00 years of use. He has never used smokeless tobacco. He reports that he does not drink alcohol or use drugs.   Family History:  The patient's  family history includes Diabetes in his mother; Heart Problems in his mother; Kidney disease in his mother; Liver disease in his mother.    ROS:  Please see the history of present illness.   All other systems are personally reviewed and negative.    PHYSICAL EXAM: VS:  BP 132/70   Pulse 74   Ht 5\' 8"  (1.727 m)   Wt 193 lb (87.5 kg)   BMI 29.35 kg/m  , BMI Body mass index is 29.35 kg/m. GEN: Well nourished, well developed, in no acute distress  HEENT: normal  Neck: no JVD, carotid bruits, or masses Cardiac: RRR; no murmurs, rubs, or gallops,no edema  Respiratory:  clear to auscultation bilaterally, normal work of breathing GI: soft, nontender, nondistended, + BS MS: no deformity or atrophy  Skin: warm and dry  Neuro:  Strength and sensation are intact Psych: euthymic mood, full affect  EKG:  EKG is ordered  today. The ekg ordered today is personally reviewed and shows sinus rhythm, PR 368 msec, septal infarct   Recent Labs: 11/19/2017: Hemoglobin 10.9; Potassium 3.5; Sodium 142  personally reviewed   Lipid Panel  No results found for: CHOL, TRIG, HDL, CHOLHDL, VLDL, LDLCALC, LDLDIRECT personally reviewed   Wt Readings from Last 3 Encounters:  12/18/17 193 lb (87.5 kg)  11/19/17 192 lb (87.1 kg)  11/01/17 192 lb 6.4 oz (87.3 kg)      Other studies personally reviewed: Additional studies/ records that were reviewed today include: ekgs from 2004- present in [place] are personally reviewed.  Echo, dobutamine echo 2017 are reviewed    ASSESSMENT AND PLAN:  1.  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 Asymptomatic No indication for pacing at this time. We discussed symptoms/ signs of AV block for which he should report immediately to the hospital (abrupt reduction in heart rate, dizziness, presyncope, syncope, etc). For now, watchful waiting is advised Avoid av nodal agents   Follow-up:  1 year  Current medicines are reviewed at length with the patient today.   The patient does not have concerns regarding his medicines.  The following changes were made today:  none   Signed, Thompson Grayer, MD  12/18/2017 9:59 AM     [facility] [address] [place] [address] [phone] (office) [phone] (fax)

## 2017-12-20 ENCOUNTER — Ambulatory Visit (INDEPENDENT_AMBULATORY_CARE_PROVIDER_SITE_OTHER): Payer: Medicare Other | Admitting: Vascular Surgery

## 2017-12-20 ENCOUNTER — Ambulatory Visit (HOSPITAL_COMMUNITY)
Admission: RE | Admit: 2017-12-20 | Discharge: 2017-12-20 | Disposition: A | Discharge status: Home / Self Care | Payer: Medicare Other | Source: Ambulatory Visit | Attending: Vascular Surgery | Admitting: Vascular Surgery

## 2017-12-20 ENCOUNTER — Other Ambulatory Visit: Payer: Self-pay

## 2017-12-20 ENCOUNTER — Encounter: Payer: Self-pay | Admitting: Vascular Surgery

## 2017-12-20 VITALS — BP 151/67 | HR 63 | Temp 97.0°F | Resp 16 | Ht 68.0 in | Wt 195.0 lb

## 2017-12-20 DIAGNOSIS — N185 Chronic kidney disease, stage 5: Secondary | ICD-10-CM | POA: Diagnosis present

## 2017-12-20 NOTE — Progress Notes (Signed)
Subjective:     Patient ID: Frank [name], male   DOB: 28-Jan-1941, 77 y.o.   MRN: [id number]  HPI 77 year old male follows up after left sided radiocephalic AV fistula creation.  He is doing very well aside from a little bit of numbness in his left thumb.  He has not used his hand much.  He has no wounds on his hand and his surgical wound has healed.   Review of Systems Numbness dorsum of left thumb    Objective:   Physical Exam Awake alert and oriented Left grip strength 5 out of 5 Strong thrill in AV fistula Palpable ulnar pulse on the left  I reviewed his dialysis access duplex today which demonstrates flow volumes greater than 600 the fistula is just at 0.5 cm throughout most the forearm.    Assessment/plan     77 year old male follows up status post left radiocephalic AV fistula creation.  At this time it is not mature enough to use yet but he is not on dialysis and hopefully with time he will continue to mature.  Flow volumes are very strong greater than 600 and suggest that this will be usable.  He can follow-up on a as needed basis.  Tykeria Wawrzyniak C. Donzetta Matters, MD [facility] [place] Office: [phone] Pager: [phone]

## 2018-01-10 ENCOUNTER — Ambulatory Visit: Payer: Medicare Other | Admitting: Vascular Surgery

## 2018-01-10 ENCOUNTER — Encounter (HOSPITAL_COMMUNITY): Payer: Medicare Other

## 2018-05-02 ENCOUNTER — Other Ambulatory Visit: Payer: Self-pay

## 2018-05-02 ENCOUNTER — Encounter: Payer: Self-pay | Admitting: Vascular Surgery

## 2018-05-02 ENCOUNTER — Ambulatory Visit (INDEPENDENT_AMBULATORY_CARE_PROVIDER_SITE_OTHER): Payer: Medicare Other | Admitting: Vascular Surgery

## 2018-05-02 ENCOUNTER — Other Ambulatory Visit: Payer: Self-pay | Admitting: Vascular Surgery

## 2018-05-02 VITALS — BP 139/69 | HR 76 | Resp 20 | Ht 68.0 in | Wt 192.6 lb

## 2018-05-02 DIAGNOSIS — N185 Chronic kidney disease, stage 5: Secondary | ICD-10-CM

## 2018-05-02 NOTE — Progress Notes (Signed)
Patient ID: Frank [name], male   DOB: [birth year], 77 y.o.   MRN: [id number]  Reason for Consult: Follow-up (eval weak thrill - Dr. Justin Mend - No HD)   Referred by Glennie Hawk, MD  Subjective:     HPI:  Frank [name] is a 77 y.o. male with chronic kidney disease whom I recently placed a left radiocephalic AV fistula in.  At last follow-up it had failed to fully mature but he was not requiring dialysis and we are letting further maturity occur.  He now sent back for evaluation as has a weak thrill.  Still has not started dialysis.  Does not take blood thinners.  Takes aspirin daily.  No pain in his hand or left upper extremity.  Past Medical History:  Diagnosis Date  . Anemia of chronic kidney failure   . Bradycardia   . Chronic kidney disease   . Diabetes mellitus without complication (HCC)    diet controlled   . Diabetic nephropathy ([place])   . Gout   . Gout   . Hyperlipidemia   . Hypothyroidism   . Peripheral neuropathy   . Peripheral vascular disease ([place])   . Secondary hyperparathyroidism ([place])    Renal  . Thyroid disease    Family History  Problem Relation Age of Onset  . Diabetes Mother   . Kidney disease Mother   . Liver disease Mother   . Heart Problems Mother    Past Surgical History:  Procedure Laterality Date  . AV FISTULA PLACEMENT Left 11/19/2017   Procedure: ARTERIOVENOUS (AV) FISTULA CREATION;  Surgeon: [name], MD;  Location: [facility];  Service: Vascular;  Laterality: Left;  . COLONOSCOPY    . EYE SURGERY     cataract surgery  . KNEE ARTHROSCOPY Left    Knee    Short Social History:  Social History   Tobacco Use  . Smoking status: Former Smoker    Years: 30.00    Last attempt to quit: 1994    Years since quitting: 25.6  . Smokeless tobacco: Never Used  Substance Use Topics  . Alcohol use: No    Allergies  Allergen Reactions  . Ace Inhibitors Other (See Comments)    Hyperkalemia on Ace  Other reaction(s): Other (See  Comments) Hyperkalemia on Ace Cr>1.9  . Angiotensin Receptor Blockers     Other reaction(s): Other (See Comments) Other reaction(s): Other (See Comments) Hyperkalemia Hyperkalemia   . Iodine-131 Other (See Comments)    Cant have due to renal failure  . Other Other (See Comments)    Iv dyes  Iv dyes     Current Outpatient Medications  Medication Sig Dispense Refill  . allopurinol (ZYLOPRIM) 100 MG tablet Take 100 mg by mouth daily.     Marland Kitchen aspirin EC 81 MG tablet Take 81 mg daily by mouth.    Marland Kitchen atorvastatin (LIPITOR) 20 MG tablet Take 20 mg daily by mouth.    . cimetidine (TAGAMET) 200 MG tablet Take 200 mg by mouth daily as needed (for pain).     . furosemide (LASIX) 20 MG tablet Take 20 mg by mouth daily.     Marland Kitchen gabapentin (NEURONTIN) 100 MG capsule Take 300 mg by mouth every evening.     Marland Kitchen HYDROcodone-acetaminophen (NORCO) 10-325 MG per tablet Take 0.5 tablets by mouth every 4 (four) hours as needed for moderate pain.     Marland Kitchen levothyroxine (SYNTHROID, LEVOTHROID) 112 MCG tablet Take 224 mcg by mouth daily before breakfast.     .  multivitamin (RENA-VIT) TABS tablet Take 1 tablet daily by mouth.    . tamsulosin (FLOMAX) 0.4 MG CAPS capsule Take 0.4 mg by mouth daily.     . Zinc Oxide (BALMEX EX) Apply 1 application topically daily.     No current facility-administered medications for this visit.     Review of Systems  Constitutional:  Constitutional negative. HENT: HENT negative.  Eyes: Eyes negative.  Respiratory: Respiratory negative.  Cardiovascular: Cardiovascular negative.  Skin: Skin negative.  Hematologic: Hematologic/lymphatic negative.        Objective:  Objective   Vitals:   05/02/18 1110  BP: 139/69  Pulse: 76  Resp: 20  SpO2: 98%  Weight: 192 lb 9.6 oz (87.4 kg)  Height: 5\' 8"  (1.727 m)   Body mass index is 29.28 kg/m.  Physical Exam  Constitutional: He is oriented to person, place, and time. He appears well-developed.  HENT:  Head: Normocephalic.    Eyes: Pupils are equal, round, and reactive to light.  Cardiovascular: Normal rate.  Pulses:      Radial pulses are 2+ on the right side, and 2+ on the left side.  Pulmonary/Chest: Effort normal.  Abdominal: Soft.  Musculoskeletal: Normal range of motion.  Left upper extremity with fistula that is pulsatile  Neurological: He is alert and oriented to person, place, and time.  Skin: Skin is warm and dry.  Psychiatric: He has a normal mood and affect. His behavior is normal. Judgment and thought content normal.    Data: I evaluated his left upper extremity with ultrasound.  The cephalic vein in the arm appears to become sclerotic towards the antecubitum.  Above the antecubitum there is a basilic vein that is quite large there is no identifiable cephalic vein.  The brachial artery appears to be free of disease.     Assessment/Plan:     77 year old male follows up after left radiocephalic AV fistula that has failed to mature.  By my ultrasound evaluation at bedside today.  It is quite sclerotic near the antecubitum.  I think his best option for permanent access will be to convert him to an upper arm basilic vein fistula which could either be done as a single or as 2 stages and I discussed with him moving forward and making a decision at the time of surgery whether or not it would be 1 or 2 surgeries moving forward.  He demonstrates good understanding we will get him set up today for at least a first stage basilic vein transposition fistula on the left.     [name] MD [facility]

## 2018-05-23 ENCOUNTER — Other Ambulatory Visit: Payer: Self-pay | Admitting: *Deleted

## 2018-06-03 ENCOUNTER — Encounter (HOSPITAL_COMMUNITY): Payer: Self-pay | Admitting: *Deleted

## 2018-06-03 NOTE — Progress Notes (Signed)
Mr Frank [name] denies chest pain or shortness of breath.  Patient has Type II diabetes, diet controlled. Mr Frank [name] reports that CBG runs 100-120.  I instructed patient to check CBG after awaking and every 2 hours until arrival  to the hospital.  I Instructed patient if CBG is less than 70 to drink  1/2 cup of a clear juice. Recheck CBG in 15 minutes then call pre- op desk at [phone] for further instructions.

## 2018-06-03 NOTE — Anesthesia Preprocedure Evaluation (Addendum)
Anesthesia Evaluation  Patient identified by MRN, date of birth, ID band Patient awake    Reviewed: Allergy & Precautions, NPO status , Patient's Chart, lab work & pertinent test results  History of Anesthesia Complications Negative for: history of anesthetic complications  Airway Mallampati: II  TM Distance: >3 FB Neck ROM: Full   Comment: Full beard Dental  (+) Edentulous Lower, Edentulous Upper, Dental Advisory Given   Pulmonary former smoker,    breath sounds clear to auscultation       Cardiovascular hypertension, + Peripheral Vascular Disease   Rhythm:Regular Rate:Normal     Neuro/Psych  Neuromuscular disease negative psych ROS   GI/Hepatic negative GI ROS, Neg liver ROS,   Endo/Other  diabetes, Type 2Hypothyroidism   Renal/GU CRFRenal disease  negative genitourinary   Musculoskeletal  (+) Arthritis ,  Gout    Abdominal   Peds  Hematology  (+) anemia ,   Anesthesia Other Findings   Reproductive/Obstetrics                           Anesthesia Physical Anesthesia Plan  ASA: IV  Anesthesia Plan: MAC   Post-op Pain Management:    Induction: Intravenous  PONV Risk Score and Plan: 1 and Propofol infusion and Treatment may vary due to age or medical condition  Airway Management Planned: Nasal Cannula and Natural Airway  Additional Equipment: None  Intra-op Plan:   Post-operative Plan:   Informed Consent: I have reviewed the patients History and Physical, chart, labs and discussed the procedure including the risks, benefits and alternatives for the proposed anesthesia with the patient or authorized representative who has indicated his/her understanding and acceptance.     Plan Discussed with: CRNA and Anesthesiologist  Anesthesia Plan Comments:        Anesthesia Quick Evaluation

## 2018-06-04 ENCOUNTER — Ambulatory Visit (HOSPITAL_COMMUNITY): Payer: Medicare Other | Admitting: Anesthesiology

## 2018-06-04 ENCOUNTER — Encounter (HOSPITAL_COMMUNITY): Payer: Self-pay

## 2018-06-04 ENCOUNTER — Telehealth: Payer: Self-pay | Admitting: Vascular Surgery

## 2018-06-04 ENCOUNTER — Encounter (HOSPITAL_COMMUNITY)
Admission: RE | Disposition: A | Discharge status: Home / Self Care | Payer: Self-pay | Source: Ambulatory Visit | Attending: Vascular Surgery

## 2018-06-04 ENCOUNTER — Ambulatory Visit (HOSPITAL_COMMUNITY)
Admission: RE | Admit: 2018-06-04 | Discharge: 2018-06-04 | Disposition: A | Discharge status: Home / Self Care | Payer: Medicare Other | Source: Ambulatory Visit | Attending: Vascular Surgery | Admitting: Vascular Surgery

## 2018-06-04 ENCOUNTER — Other Ambulatory Visit: Payer: Self-pay

## 2018-06-04 DIAGNOSIS — N189 Chronic kidney disease, unspecified: Secondary | ICD-10-CM | POA: Insufficient documentation

## 2018-06-04 DIAGNOSIS — E039 Hypothyroidism, unspecified: Secondary | ICD-10-CM | POA: Insufficient documentation

## 2018-06-04 DIAGNOSIS — M109 Gout, unspecified: Secondary | ICD-10-CM | POA: Diagnosis not present

## 2018-06-04 DIAGNOSIS — Z7982 Long term (current) use of aspirin: Secondary | ICD-10-CM | POA: Insufficient documentation

## 2018-06-04 DIAGNOSIS — M1990 Unspecified osteoarthritis, unspecified site: Secondary | ICD-10-CM | POA: Diagnosis not present

## 2018-06-04 DIAGNOSIS — N185 Chronic kidney disease, stage 5: Secondary | ICD-10-CM

## 2018-06-04 DIAGNOSIS — E1142 Type 2 diabetes mellitus with diabetic polyneuropathy: Secondary | ICD-10-CM | POA: Insufficient documentation

## 2018-06-04 DIAGNOSIS — E785 Hyperlipidemia, unspecified: Secondary | ICD-10-CM | POA: Diagnosis not present

## 2018-06-04 DIAGNOSIS — E11319 Type 2 diabetes mellitus with unspecified diabetic retinopathy without macular edema: Secondary | ICD-10-CM | POA: Diagnosis not present

## 2018-06-04 DIAGNOSIS — R600 Localized edema: Secondary | ICD-10-CM | POA: Insufficient documentation

## 2018-06-04 DIAGNOSIS — Z79899 Other long term (current) drug therapy: Secondary | ICD-10-CM | POA: Insufficient documentation

## 2018-06-04 DIAGNOSIS — N529 Male erectile dysfunction, unspecified: Secondary | ICD-10-CM | POA: Insufficient documentation

## 2018-06-04 DIAGNOSIS — I129 Hypertensive chronic kidney disease with stage 1 through stage 4 chronic kidney disease, or unspecified chronic kidney disease: Secondary | ICD-10-CM | POA: Insufficient documentation

## 2018-06-04 DIAGNOSIS — Z79891 Long term (current) use of opiate analgesic: Secondary | ICD-10-CM | POA: Diagnosis not present

## 2018-06-04 DIAGNOSIS — Z79890 Hormone replacement therapy (postmenopausal): Secondary | ICD-10-CM | POA: Insufficient documentation

## 2018-06-04 DIAGNOSIS — Z9889 Other specified postprocedural states: Secondary | ICD-10-CM | POA: Diagnosis not present

## 2018-06-04 HISTORY — DX: Polyneuropathy, unspecified: G62.9

## 2018-06-04 HISTORY — PX: BASCILIC VEIN TRANSPOSITION: SHX5742

## 2018-06-04 LAB — POCT I-STAT 4, (NA,K, GLUC, HGB,HCT)
Glucose, Bld: 94 mg/dL (ref 70–99)
HCT: 35 % — ABNORMAL LOW (ref 39.0–52.0)
Hemoglobin: 11.9 g/dL — ABNORMAL LOW (ref 13.0–17.0)
Potassium: 3.1 mmol/L — ABNORMAL LOW (ref 3.5–5.1)
Sodium: 138 mmol/L (ref 135–145)

## 2018-06-04 LAB — HEMOGLOBIN A1C
Hgb A1c MFr Bld: 5.9 % — ABNORMAL HIGH (ref 4.8–5.6)
Mean Plasma Glucose: 122.63 mg/dL

## 2018-06-04 LAB — GLUCOSE, CAPILLARY: Glucose-Capillary: 116 mg/dL — ABNORMAL HIGH (ref 70–99)

## 2018-06-04 SURGERY — TRANSPOSITION, VEIN, BASILIC
Anesthesia: Monitor Anesthesia Care | Site: Arm Upper | Laterality: Left

## 2018-06-04 MED ORDER — SODIUM CHLORIDE 0.9 % IV SOLN
INTRAVENOUS | Status: AC
  Filled 2018-06-04: qty 1.2

## 2018-06-04 MED ORDER — SODIUM CHLORIDE 0.9 % IV SOLN
INTRAVENOUS | Status: DC
  Administered 2018-06-04: 07:00:00 via INTRAVENOUS

## 2018-06-04 MED ORDER — SODIUM CHLORIDE 0.9 % IV SOLN
INTRAVENOUS | Status: DC | PRN
  Administered 2018-06-04: 07:00:00

## 2018-06-04 MED ORDER — HYDROCODONE-ACETAMINOPHEN 5-325 MG PO TABS: 1.0000 | ORAL_TABLET | Freq: Four times a day (QID) | ORAL | 0 refills | Status: DC | PRN

## 2018-06-04 MED ORDER — PROPOFOL 10 MG/ML IV BOLUS
INTRAVENOUS | Status: AC
  Filled 2018-06-04: qty 20

## 2018-06-04 MED ORDER — CHLORHEXIDINE GLUCONATE CLOTH 2 % EX PADS: 6.0000 | MEDICATED_PAD | Freq: Once | CUTANEOUS | Status: DC

## 2018-06-04 MED ORDER — LIDOCAINE 2% (20 MG/ML) 5 ML SYRINGE
INTRAMUSCULAR | Status: AC
  Filled 2018-06-04: qty 5

## 2018-06-04 MED ORDER — ONDANSETRON HCL 4 MG/2ML IJ SOLN
INTRAMUSCULAR | Status: AC
  Filled 2018-06-04: qty 2

## 2018-06-04 MED ORDER — SODIUM CHLORIDE 0.9 % IV SOLN: INTRAVENOUS | Status: DC

## 2018-06-04 MED ORDER — PHENYLEPHRINE 40 MCG/ML (10ML) SYRINGE FOR IV PUSH (FOR BLOOD PRESSURE SUPPORT)
PREFILLED_SYRINGE | INTRAVENOUS | Status: DC | PRN
  Administered 2018-06-04 (×4): 80 ug via INTRAVENOUS

## 2018-06-04 MED ORDER — CEFAZOLIN SODIUM-DEXTROSE 2-4 GM/100ML-% IV SOLN
2.0000 g | INTRAVENOUS | Status: AC
  Administered 2018-06-04: 2 g via INTRAVENOUS
  Filled 2018-06-04: qty 100

## 2018-06-04 MED ORDER — PROPOFOL 500 MG/50ML IV EMUL
INTRAVENOUS | Status: DC | PRN
  Administered 2018-06-04: 75 ug/kg/min via INTRAVENOUS

## 2018-06-04 MED ORDER — MIDAZOLAM HCL 5 MG/5ML IJ SOLN
INTRAMUSCULAR | Status: DC | PRN
  Administered 2018-06-04: 2 mg via INTRAVENOUS

## 2018-06-04 MED ORDER — LIDOCAINE-EPINEPHRINE (PF) 1 %-1:200000 IJ SOLN
INTRAMUSCULAR | Status: AC
  Filled 2018-06-04: qty 30

## 2018-06-04 MED ORDER — PHENYLEPHRINE 40 MCG/ML (10ML) SYRINGE FOR IV PUSH (FOR BLOOD PRESSURE SUPPORT)
PREFILLED_SYRINGE | INTRAVENOUS | Status: AC
  Filled 2018-06-04: qty 10

## 2018-06-04 MED ORDER — MIDAZOLAM HCL 2 MG/2ML IJ SOLN
INTRAMUSCULAR | Status: AC
  Filled 2018-06-04: qty 2

## 2018-06-04 MED ORDER — ONDANSETRON HCL 4 MG/2ML IJ SOLN
INTRAMUSCULAR | Status: DC | PRN
  Administered 2018-06-04: 4 mg via INTRAVENOUS

## 2018-06-04 MED ORDER — LIDOCAINE-EPINEPHRINE (PF) 1 %-1:200000 IJ SOLN
INTRAMUSCULAR | Status: DC | PRN
  Administered 2018-06-04 (×2): 30 mL

## 2018-06-04 MED ORDER — FENTANYL CITRATE (PF) 250 MCG/5ML IJ SOLN
INTRAMUSCULAR | Status: AC
  Filled 2018-06-04: qty 5

## 2018-06-04 MED ORDER — HEPARIN SODIUM (PORCINE) 1000 UNIT/ML IJ SOLN
INTRAMUSCULAR | Status: AC
  Filled 2018-06-04: qty 1

## 2018-06-04 MED ORDER — PROTAMINE SULFATE 10 MG/ML IV SOLN
INTRAVENOUS | Status: AC
  Filled 2018-06-04: qty 5

## 2018-06-04 MED ORDER — 0.9 % SODIUM CHLORIDE (POUR BTL) OPTIME
TOPICAL | Status: DC | PRN
  Administered 2018-06-04: 1000 mL

## 2018-06-04 MED ORDER — ARTIFICIAL TEARS OPHTHALMIC OINT
TOPICAL_OINTMENT | OPHTHALMIC | Status: AC
  Filled 2018-06-04: qty 3.5

## 2018-06-04 SURGICAL SUPPLY — 36 items
ARMBAND PINK RESTRICT EXTREMIT (MISCELLANEOUS) ×3 IMPLANT
CANISTER SUCT 3000ML PPV (MISCELLANEOUS) ×3 IMPLANT
CLIP VESOCCLUDE MED 24/CT (CLIP) ×3 IMPLANT
CLIP VESOCCLUDE SM WIDE 24/CT (CLIP) ×3 IMPLANT
COVER PROBE W GEL 5X96 (DRAPES) ×3 IMPLANT
DERMABOND ADVANCED (GAUZE/BANDAGES/DRESSINGS) ×2
DERMABOND ADVANCED .7 DNX12 (GAUZE/BANDAGES/DRESSINGS) ×1 IMPLANT
ELECT REM PT RETURN 9FT ADLT (ELECTROSURGICAL) ×3
ELECTRODE REM PT RTRN 9FT ADLT (ELECTROSURGICAL) ×1 IMPLANT
GLOVE BIO SURGEON STRL SZ7.5 (GLOVE) ×9 IMPLANT
GLOVE BIOGEL PI IND STRL 6.5 (GLOVE) ×1 IMPLANT
GLOVE BIOGEL PI IND STRL 7.0 (GLOVE) ×1 IMPLANT
GLOVE BIOGEL PI IND STRL 7.5 (GLOVE) ×1 IMPLANT
GLOVE BIOGEL PI IND STRL 8 (GLOVE) ×1 IMPLANT
GLOVE BIOGEL PI INDICATOR 6.5 (GLOVE) ×2
GLOVE BIOGEL PI INDICATOR 7.0 (GLOVE) ×2
GLOVE BIOGEL PI INDICATOR 7.5 (GLOVE) ×2
GLOVE BIOGEL PI INDICATOR 8 (GLOVE) ×2
GLOVE ECLIPSE 6.5 STRL STRAW (GLOVE) ×3 IMPLANT
GOWN STRL REUS W/ TWL LRG LVL3 (GOWN DISPOSABLE) ×2 IMPLANT
GOWN STRL REUS W/ TWL XL LVL3 (GOWN DISPOSABLE) ×2 IMPLANT
GOWN STRL REUS W/TWL LRG LVL3 (GOWN DISPOSABLE) ×4
GOWN STRL REUS W/TWL XL LVL3 (GOWN DISPOSABLE) ×4
KIT BASIN OR (CUSTOM PROCEDURE TRAY) ×3 IMPLANT
KIT TURNOVER KIT B (KITS) ×3 IMPLANT
NS IRRIG 1000ML POUR BTL (IV SOLUTION) ×3 IMPLANT
PACK CV ACCESS (CUSTOM PROCEDURE TRAY) ×3 IMPLANT
PAD ARMBOARD 7.5X6 YLW CONV (MISCELLANEOUS) ×6 IMPLANT
SUT MNCRL AB 4-0 PS2 18 (SUTURE) ×3 IMPLANT
SUT PROLENE 6 0 BV (SUTURE) ×3 IMPLANT
SUT SILK 2 0 SH (SUTURE) IMPLANT
SUT VIC AB 3-0 SH 27 (SUTURE) ×2
SUT VIC AB 3-0 SH 27X BRD (SUTURE) ×1 IMPLANT
TOWEL GREEN STERILE (TOWEL DISPOSABLE) ×3 IMPLANT
UNDERPAD 30X30 (UNDERPADS AND DIAPERS) ×3 IMPLANT
WATER STERILE IRR 1000ML POUR (IV SOLUTION) ×3 IMPLANT

## 2018-06-04 NOTE — Op Note (Signed)
    Patient name: Frank [name] MRN: [id number] DOB: 12/31/1940 Sex: male  06/04/2018 Pre-operative Diagnosis: ckd Post-operative diagnosis:  Same Surgeon:  Erlene Quan C. Donzetta Matters, MD Assistant: Arlee Muslim, PA Procedure Performed: Left single stage basilic vein transposed av fistula  Indications: 77 year old male with history of chronic kidney disease has had a failed left radiocephalic AV fistula.  He is now indicated for permanent access appears to have basilic vein for fistula creation on the left arm.  Findings: Basilic vein was large at least 5 mm in diameter and had somewhat matured.  At completion there was a strong thrill as well as a palpable radial pulse at the wrist.   Procedure:  The patient was identified in the holding area and taken to the operating room where is placed supine the operative table and MAC anesthesia was induced.  He was sterilely prepped and draped in the left upper extremity usual fashion he was given antibiotics and a timeout was called.  We began using ultrasound to identify the basilic vein.  I then anesthetized the entirety of the upper arm with 1% lidocaine with epinephrine.  A curvilinear incision was made just above the antecubitum which we dissected out the basilic vein divided branches between clips and ties.  One separate counterincision was made near the axilla.  We are able to mobilize the entire basilic vein throughout this course.  We then transected above the antecubitum tied off with 2-0 silk flushed with heparinized saline marked for orientation clamped.  I then dissected out the brachial artery placed a vessel loop around this.  The basilic vein was then tunneled maintaining orientation.  It flushed easily with heparinized saline.  The artery was clamped distally and proximally opened longitudinally flushed with heparinized saline both directions.  The vein was then sewn end-to-side with 6-0 Prolene suture.  Prior to completion anastomosis we allowed  flushing all directions.  Upon completion there was a palpable thrill in the runoff vein did have good orientation there is also palpable radial pulse the wrist.  Satisfied with this we obtained hemostasis irrigated all wounds closed in layers with Vicryl and Monocryl.  Dermabond was placed to level the skin.  He was allowed away from anesthesia having tolerated procedure well without immediate complication.  All counts were correct at completion.  EBL 50 cc.    Shawnie Nicole C. Donzetta Matters, MD [facility] [place] Office: [phone] Pager: [phone]

## 2018-06-04 NOTE — Anesthesia Postprocedure Evaluation (Signed)
Anesthesia Post Note  Patient: Frank [name]  Procedure(s) Performed: BASILIC VEIN TRANSPOSITION ARM (Left Arm Upper)     Patient location during evaluation: PACU Anesthesia Type: MAC Level of consciousness: awake and alert Pain management: pain level controlled Vital Signs Assessment: post-procedure vital signs reviewed and stable Respiratory status: spontaneous breathing, nonlabored ventilation and respiratory function stable Cardiovascular status: stable and blood pressure returned to baseline Anesthetic complications: no    Last Vitals:  Vitals:   06/04/18 1015 06/04/18 1024  BP: (!) 115/54 (!) 135/55  Pulse: 75 86  Resp: 16 18  Temp: (!) 36.4 C   SpO2: 99% 100%    Last Pain:  Vitals:   06/04/18 1024  TempSrc:   PainSc: 0-No pain                 Audry Pili

## 2018-06-04 NOTE — Telephone Encounter (Signed)
sch appt spk to pt daughter may have to resch due to transpo 07/18/18 12pm Dialysis duplex 1pm p/o PA

## 2018-06-04 NOTE — Transfer of Care (Signed)
Immediate Anesthesia Transfer of Care Note  Patient: Frank [name]  Procedure(s) Performed: BASILIC VEIN TRANSPOSITION ARM (Left Arm Upper)  Patient Location: PACU  Anesthesia Type:MAC  Level of Consciousness: awake, oriented and patient cooperative  Airway & Oxygen Therapy: Patient Spontanous Breathing and Patient connected to face mask oxygen  Post-op Assessment: Report given to RN and Post -op Vital signs reviewed and stable  Post vital signs: Reviewed  Last Vitals:  Vitals Value Taken Time  BP    Temp    Pulse    Resp    SpO2      Last Pain:  Vitals:   06/04/18 0631  TempSrc:   PainSc: 0-No pain      Patients Stated Pain Goal: 3 (49/96/92 4932)  Complications: No apparent anesthesia complications

## 2018-06-04 NOTE — Anesthesia Procedure Notes (Signed)
Procedure Name: MAC Date/Time: 06/04/2018 7:35 AM Performed by: Jenne Campus, CRNA Pre-anesthesia Checklist: Patient identified, Emergency Drugs available, Suction available and Patient being monitored Oxygen Delivery Method: Simple face mask

## 2018-06-04 NOTE — Discharge Instructions (Signed)
[facility]  Discharge Instructions  AV Fistula or Graft Surgery for Dialysis Access  Please refer to the following instructions for your post-procedure care. Your surgeon or physician assistant will discuss any changes with you.  Activity  You may drive the day following your surgery, if you are comfortable and no longer taking prescription pain medication. Resume full activity as the soreness in your incision resolves.  Bathing/Showering  You may shower after you go home. Keep your incision dry for 48 hours. Do not soak in a bathtub, hot tub, or swim until the incision heals completely. You may not shower if you have a hemodialysis catheter.  Incision Care  Clean your incision with mild soap and water after 48 hours. Pat the area dry with a clean towel. You do not need a bandage unless otherwise instructed. Do not apply any ointments or creams to your incision. You may have skin glue on your incision. Do not peel it off. It will come off on its own in about one week. Your arm may swell a bit after surgery. To reduce swelling use pillows to elevate your arm so it is above your heart. Your doctor will tell you if you need to lightly wrap your arm with an ACE bandage.  Diet  Resume your normal diet. There are not special food restrictions following this procedure. In order to heal from your surgery, it is CRITICAL to get adequate nutrition. Your body requires vitamins, minerals, and protein. Vegetables are the best source of vitamins and minerals. Vegetables also provide the perfect balance of protein. Processed food has little nutritional value, so try to avoid this.  Medications  Resume taking all of your medications. If your incision is causing pain, you may take over-the counter pain relievers such as acetaminophen (Tylenol). If you were prescribed a stronger pain medication, please be aware these medications can cause nausea and constipation. Prevent  nausea by taking the medication with a snack or meal. Avoid constipation by drinking plenty of fluids and eating foods with high amount of fiber, such as fruits, vegetables, and grains. Do not take Tylenol if you are taking prescription pain medications.     Follow up Your surgeon may want to see you in the office following your access surgery. If so, this will be arranged at the time of your surgery.  Please call us immediately for any of the following conditions:  Increased pain, redness, drainage (pus) from your incision site Fever of 101 degrees or higher Severe or worsening pain at your incision site Hand pain or numbness.  Reduce your risk of vascular disease:  Stop smoking. If you would like help, call [facility] at 1-800-QUIT-NOW 770-417-7138) or [place] at [place] your cholesterol Maintain a desired weight Control your diabetes Keep your blood pressure down  Dialysis  It will take several weeks to several months for your new dialysis access to be ready for use. Your surgeon will determine when it is OK to use it. Your nephrologist will continue to direct your dialysis. You can continue to use your Permcath until your new access is ready for use.  If you have any questions, please call the office at [phone].   Post Anesthesia Home Care Instructions  Activity: Get plenty of rest for the remainder of the day. A responsible individual must stay with you for 24 hours following the procedure.  For the next 24 hours, DO NOT: -Drive a car -Paediatric nurse -Drink alcoholic  beverages -Take any medication unless instructed by your physician -Make any legal decisions or sign important papers.  Meals: Start with liquid foods such as gelatin or soup. Progress to regular foods as tolerated. Avoid greasy, spicy, heavy foods. If nausea and/or vomiting occur, drink only clear liquids until the nausea and/or vomiting subsides. Call your physician if  vomiting continues.  Special Instructions/Symptoms: Your throat may feel dry or sore from the anesthesia or the breathing tube placed in your throat during surgery. If this causes discomfort, gargle with warm salt water. The discomfort should disappear within 24 hours.  If you had a scopolamine patch placed behind your ear for the management of post- operative nausea and/or vomiting:  1. The medication in the patch is effective for 72 hours, after which it should be removed.  Wrap patch in a tissue and discard in the trash. Wash hands thoroughly with soap and water. 2. You may remove the patch earlier than 72 hours if you experience unpleasant side effects which may include dry mouth, dizziness or visual disturbances. 3. Avoid touching the patch. Wash your hands with soap and water after contact with the patch.

## 2018-06-04 NOTE — H&P (Signed)
   History and Physical Update  The patient was interviewed and re-examined.  The patient's previous History and Physical has been reviewed and is unchanged from recent office visit. Plan for 1 or 2 stage left arm bvt.   Reynald Woods C. Donzetta Matters, MD [facility] Office: [phone] Pager: [phone]  06/04/2018, 7:25 AM

## 2018-06-05 ENCOUNTER — Encounter (HOSPITAL_COMMUNITY): Payer: Self-pay | Admitting: Vascular Surgery

## 2018-06-12 ENCOUNTER — Other Ambulatory Visit: Payer: Self-pay

## 2018-06-12 DIAGNOSIS — N185 Chronic kidney disease, stage 5: Secondary | ICD-10-CM

## 2018-06-13 NOTE — H&P (Signed)
Patient ID: Frank [name], male   DOB: [birth year], 77 y.o.   MRN: [id number]  Reason for Consult: Follow-up (eval weak thrill - Dr. Justin Mend - No HD)   Referred by Glennie Hawk, MD  Subjective:     HPI:  Frank [name] is a 77 y.o. male with chronic kidney disease whom I recently placed a left radiocephalic AV fistula in.  At last follow-up it had failed to fully mature but he was not requiring dialysis and we are letting further maturity occur.  He now sent back for evaluation as has a weak thrill.  Still has not started dialysis.  Does not take blood thinners.  Takes aspirin daily.  No pain in his hand or left upper extremity.      Past Medical History:  Diagnosis Date  . Anemia of chronic kidney failure   . Bradycardia   . Chronic kidney disease   . Diabetes mellitus without complication (HCC)    diet controlled   . Diabetic nephropathy ([place])   . Gout   . Gout   . Hyperlipidemia   . Hypothyroidism   . Peripheral neuropathy   . Peripheral vascular disease ([place])   . Secondary hyperparathyroidism ([place])    Renal  . Thyroid disease         Family History  Problem Relation Age of Onset  . Diabetes Mother   . Kidney disease Mother   . Liver disease Mother   . Heart Problems Mother         Past Surgical History:  Procedure Laterality Date  . AV FISTULA PLACEMENT Left 11/19/2017   Procedure: ARTERIOVENOUS (AV) FISTULA CREATION;  Surgeon: [name], MD;  Location: [facility];  Service: Vascular;  Laterality: Left;  . COLONOSCOPY    . EYE SURGERY     cataract surgery  . KNEE ARTHROSCOPY Left    Knee    Short Social History:  Social History        Tobacco Use  . Smoking status: Former Smoker    Years: 30.00    Last attempt to quit: 1994    Years since quitting: 25.6  . Smokeless tobacco: Never Used  Substance Use Topics  . Alcohol use: No         Allergies  Allergen Reactions  . Ace Inhibitors Other  (See Comments)    Hyperkalemia on Ace  Other reaction(s): Other (See Comments) Hyperkalemia on Ace Cr>1.9  . Angiotensin Receptor Blockers     Other reaction(s): Other (See Comments) Other reaction(s): Other (See Comments) Hyperkalemia Hyperkalemia   . Iodine-131 Other (See Comments)    Cant have due to renal failure  . Other Other (See Comments)    Iv dyes  Iv dyes           Current Outpatient Medications  Medication Sig Dispense Refill  . allopurinol (ZYLOPRIM) 100 MG tablet Take 100 mg by mouth daily.     Marland Kitchen aspirin EC 81 MG tablet Take 81 mg daily by mouth.    Marland Kitchen atorvastatin (LIPITOR) 20 MG tablet Take 20 mg daily by mouth.    . cimetidine (TAGAMET) 200 MG tablet Take 200 mg by mouth daily as needed (for pain).     . furosemide (LASIX) 20 MG tablet Take 20 mg by mouth daily.     Marland Kitchen gabapentin (NEURONTIN) 100 MG capsule Take 300 mg by mouth every evening.     Marland Kitchen HYDROcodone-acetaminophen (NORCO) 10-325 MG per tablet Take 0.5 tablets  by mouth every 4 (four) hours as needed for moderate pain.     Marland Kitchen levothyroxine (SYNTHROID, LEVOTHROID) 112 MCG tablet Take 224 mcg by mouth daily before breakfast.     . multivitamin (RENA-VIT) TABS tablet Take 1 tablet daily by mouth.    . tamsulosin (FLOMAX) 0.4 MG CAPS capsule Take 0.4 mg by mouth daily.     . Zinc Oxide (BALMEX EX) Apply 1 application topically daily.     No current facility-administered medications for this visit.     Review of Systems  Constitutional:  Constitutional negative. HENT: HENT negative.  Eyes: Eyes negative.  Respiratory: Respiratory negative.  Cardiovascular: Cardiovascular negative.  Skin: Skin negative.  Hematologic: Hematologic/lymphatic negative.        Objective:  Objective      Vitals:   05/02/18 1110  BP: 139/69  Pulse: 76  Resp: 20  SpO2: 98%  Weight: 192 lb 9.6 oz (87.4 kg)  Height: 5\' 8"  (1.727 m)   Body mass index is 29.28  kg/m.  Physical Exam  Constitutional: He is oriented to person, place, and time. He appears well-developed.  HENT:  Head: Normocephalic.  Eyes: Pupils are equal, round, and reactive to light.  Cardiovascular: Normal rate.  Pulses:      Radial pulses are 2+ on the right side, and 2+ on the left side.  Pulmonary/Chest: Effort normal.  Abdominal: Soft.  Musculoskeletal: Normal range of motion.  Left upper extremity with fistula that is pulsatile  Neurological: He is alert and oriented to person, place, and time.  Skin: Skin is warm and dry.  Psychiatric: He has a normal mood and affect. His behavior is normal. Judgment and thought content normal.    Data: I evaluated his left upper extremity with ultrasound.  The cephalic vein in the arm appears to become sclerotic towards the antecubitum.  Above the antecubitum there is a basilic vein that is quite large there is no identifiable cephalic vein.  The brachial artery appears to be free of disease.     Assessment/Plan:   77 year old male follows up after left radiocephalic AV fistula that has failed to mature.  By my ultrasound evaluation at bedside today.  It is quite sclerotic near the antecubitum.  I think his best option for permanent access will be to convert him to an upper arm basilic vein fistula which could either be done as a single or as 2 stages and I discussed with him moving forward and making a decision at the time of surgery whether or not it would be 1 or 2 surgeries moving forward.  He demonstrates good understanding we will get him set up today for at least a first stage basilic vein transposition fistula on the left.   [name] MD [facility]

## 2018-07-18 ENCOUNTER — Ambulatory Visit (HOSPITAL_COMMUNITY)
Admission: RE | Admit: 2018-07-18 | Discharge: 2018-07-18 | Disposition: A | Discharge status: Home / Self Care | Payer: Medicare Other | Source: Ambulatory Visit | Attending: Family Medicine | Admitting: Family Medicine

## 2018-07-18 ENCOUNTER — Ambulatory Visit (INDEPENDENT_AMBULATORY_CARE_PROVIDER_SITE_OTHER): Payer: Self-pay | Admitting: Physician Assistant

## 2018-07-18 VITALS — BP 149/68 | HR 71 | Temp 97.5°F | Resp 16 | Ht 68.0 in | Wt 200.0 lb

## 2018-07-18 DIAGNOSIS — N185 Chronic kidney disease, stage 5: Secondary | ICD-10-CM | POA: Insufficient documentation

## 2018-07-18 DIAGNOSIS — N184 Chronic kidney disease, stage 4 (severe): Secondary | ICD-10-CM

## 2018-07-18 NOTE — Progress Notes (Signed)
    Postoperative Access Visit   History of Present Illness   Frank [name] is a 77 y.o. year old male who presents for postoperative follow-up for: left single stage basilic vein transposition by Dr. Donzetta Matters (Date: 06/04/18).  The patient's wounds are  Healing well.  The patient denies steal symptoms.  The patient is able to complete their activities of daily living.  He is not yet on hemodialysis.  CKD is managed by Dr. Justin Mend.  Surgical history includes L radiocephalic fistula that never matured.   Physical Examination   Vitals:   07/18/18 1256  BP: (!) 149/68  Pulse: 71  Resp: 16  Temp: (!) 97.5 F (36.4 C)  TempSrc: Oral  SpO2: 99%  Weight: 200 lb (90.7 kg)  Height: 5\' 8"  (1.727 m)   Body mass index is 30.41 kg/m.  left arm Upper arm incision is wellhealed, AC fossa incision healing well; still some inflammation and possible fluid collection around [facility] fossa, no sign of infection, hand grip is 5/5, sensation in digits is intact, palpable thrill, bruit can be auscultated; palpable L radial pulse     Medical Decision Making   Frank [name] is a 77 y.o. year old male who presents s/p left single stage basilic vein transposition   The patient's access will be ready for use 08/13/18 .  Please notify [facility] if hemodialysis will need to be started prior to that date  Thank you for allowing [place] to participate in this patient's care.   Dagoberto Ligas PA-C [facility] Office: [phone]

## 2018-12-10 ENCOUNTER — Telehealth: Payer: Self-pay | Admitting: Internal Medicine

## 2018-12-10 NOTE — Telephone Encounter (Signed)
New Message    Pts daughter is calling about the upcoming appt that is scheduled   Please call

## 2018-12-16 NOTE — Telephone Encounter (Signed)
Pt will be contacted regarding visit.

## 2018-12-18 ENCOUNTER — Telehealth (INDEPENDENT_AMBULATORY_CARE_PROVIDER_SITE_OTHER): Payer: Medicare Other | Admitting: Internal Medicine

## 2018-12-18 ENCOUNTER — Encounter: Payer: Self-pay | Admitting: Internal Medicine

## 2018-12-18 ENCOUNTER — Other Ambulatory Visit: Payer: Self-pay

## 2018-12-18 DIAGNOSIS — I441 Atrioventricular block, second degree: Secondary | ICD-10-CM

## 2018-12-18 NOTE — Progress Notes (Signed)
Electrophysiology TeleHealth Note   Due to national recommendations of social distancing due to [place] 19, an audio telehealth visit is felt to be most appropriate for this patient at this time.  He does not have a smart phone, Internet, or ability to do a virtual visit. Verbal consent was obtained by me from the patient today.   Date:  12/18/2018   ID:  Frank [name], DOB 09-25-1940, MRN [id number]  Location: patient's home  Provider location: [facility]  Evaluation Performed: Follow-up visit  PCP:  Glennie Hawk, MD   Electrophysiologist:  Dr Rayann Heman  Chief Complaint:  First degree AV block  History of Present Illness:    Frank [name] is a 78 y.o. male who presents via audio  conferencing for a telehealth visit today.  Since last being seen in our clinic, the patient reports doing very well.  Today, he denies symptoms of palpitations, chest pain, shortness of breath,  lower extremity edema, dizziness, presyncope, or syncope.  The patient is otherwise without complaint today.  The patient denies symptoms of fevers, chills, cough, or new SOB worrisome for COVID 19.  Past Medical History:  Diagnosis Date  . Anemia of chronic kidney failure   . Bradycardia   . Chronic kidney disease    [facility]  . Diabetes mellitus without complication (HCC)    diet controlled   . Diabetic nephropathy ([place])   . Gout   . Gout   . Hyperlipidemia   . Hypothyroidism   . Neuropathy   . Peripheral neuropathy   . Peripheral vascular disease ([place])   . Secondary hyperparathyroidism ([place])    Renal  . Thyroid disease     Past Surgical History:  Procedure Laterality Date  . AV FISTULA PLACEMENT Left 11/19/2017   Procedure: ARTERIOVENOUS (AV) FISTULA CREATION;  Surgeon: [name], MD;  Location: [facility];  Service: Vascular;  Laterality: Left;  . BASCILIC VEIN TRANSPOSITION Left 06/04/2018   Procedure: BASILIC VEIN TRANSPOSITION ARM;  Surgeon: [name], MD;  Location: [facility];  Service: Vascular;  Laterality: Left;  . COLONOSCOPY    . EYE SURGERY     cataract surgery  . KNEE ARTHROSCOPY Left    Knee    Current Outpatient Medications  Medication Sig Dispense Refill  . allopurinol (ZYLOPRIM) 100 MG tablet Take 100 mg by mouth daily.     Marland Kitchen aspirin EC 81 MG tablet Take 81 mg daily by mouth.    Marland Kitchen atorvastatin (LIPITOR) 20 MG tablet Take 20 mg daily by mouth.    . calcitRIOL (ROCALTROL) 0.25 MCG capsule Take 0.25 mcg by mouth daily.    . furosemide (LASIX) 20 MG tablet Take 20 mg by mouth daily.     Marland Kitchen gabapentin (NEURONTIN) 100 MG capsule Take 300 mg by mouth every evening.     Marland Kitchen HYDROcodone-acetaminophen (NORCO) 5-325 MG tablet Take 1 tablet by mouth every 6 (six) hours as needed for moderate pain. 12 tablet 0  . levothyroxine (SYNTHROID, LEVOTHROID) 200 MCG tablet Take 175 mcg by mouth daily before breakfast.   3  . multivitamin (RENA-VIT) TABS tablet Take 1 tablet daily by mouth.    . tamsulosin (FLOMAX) 0.4 MG CAPS capsule Take 0.4 mg by mouth daily.     . Zinc Oxide (BALMEX EX) Apply 1 application topically daily as needed (irritation).      No current facility-administered medications for this visit.     Allergies:   Ace inhibitors;  Angiotensin receptor blockers; Iodine-131; and Other   Social History:  The patient  reports that he quit smoking about 26 years ago. He quit after 30.00 years of use. He has never used smokeless tobacco. He reports that he does not drink alcohol or use drugs.   Family History:  The patient's  family history includes Diabetes in his mother; Heart Problems in his mother; Kidney disease in his mother; Liver disease in his mother.   ROS:  Please see the history of present illness.   All other systems are personally reviewed and negative.    Exam:    Vital Signs:  There were no vitals taken for this visit.  Well wounds   Labs/Other Tests and Data Reviewed:    Recent Labs: 06/04/2018:  Hemoglobin 11.9; Potassium 3.1; Sodium 138   Wt Readings from Last 3 Encounters:  07/18/18 200 lb (90.7 kg)  06/04/18 191 lb (86.6 kg)  05/02/18 192 lb 9.6 oz (87.4 kg)    ekg 12/2017 is reviewed   ASSESSMENT & PLAN:    1.  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 COVID 19 screen The patient denies symptoms of COVID 19 at this time.  The importance of social distancing was discussed today.  Follow-up: 12 months with EP APP  Current medicines are reviewed at length with the patient today.   The patient does not have concerns regarding his medicines.  The following changes were made today:  none  Labs/ tests ordered today include:  No orders of the defined types were placed in this encounter.  Patient Risk:  after full review of this patients clinical status, I feel that they are at moderate risk at this time.  Today, I have spent 10 minutes with the patient with telehealth technology discussing AV block .    Army Fossa, MD  12/18/2018 11:02 AM     [facility] [address] [place]  [id number] [phone] (office) [phone] (fax)

## 2018-12-22 ENCOUNTER — Ambulatory Visit: Payer: Medicare Other | Admitting: Internal Medicine

## 2019-01-21 ENCOUNTER — Encounter (HOSPITAL_COMMUNITY): Payer: Self-pay | Admitting: Internal Medicine

## 2019-01-21 ENCOUNTER — Inpatient Hospital Stay (HOSPITAL_COMMUNITY)
Admission: EM | Admit: 2019-01-21 | Discharge: 2019-01-28 | DRG: 674 | Disposition: A | Discharge status: Home / Self Care | Payer: Medicare Other | Attending: Family Medicine | Admitting: Family Medicine

## 2019-01-21 ENCOUNTER — Emergency Department (HOSPITAL_COMMUNITY): Payer: Medicare Other

## 2019-01-21 ENCOUNTER — Other Ambulatory Visit: Payer: Self-pay

## 2019-01-21 DIAGNOSIS — R338 Other retention of urine: Secondary | ICD-10-CM | POA: Diagnosis present

## 2019-01-21 DIAGNOSIS — E039 Hypothyroidism, unspecified: Secondary | ICD-10-CM | POA: Diagnosis present

## 2019-01-21 DIAGNOSIS — R531 Weakness: Secondary | ICD-10-CM

## 2019-01-21 DIAGNOSIS — D631 Anemia in chronic kidney disease: Secondary | ICD-10-CM | POA: Diagnosis present

## 2019-01-21 DIAGNOSIS — E785 Hyperlipidemia, unspecified: Secondary | ICD-10-CM | POA: Diagnosis present

## 2019-01-21 DIAGNOSIS — R55 Syncope and collapse: Secondary | ICD-10-CM | POA: Diagnosis not present

## 2019-01-21 DIAGNOSIS — Z419 Encounter for procedure for purposes other than remedying health state, unspecified: Secondary | ICD-10-CM

## 2019-01-21 DIAGNOSIS — Z452 Encounter for adjustment and management of vascular access device: Secondary | ICD-10-CM

## 2019-01-21 DIAGNOSIS — R7989 Other specified abnormal findings of blood chemistry: Secondary | ICD-10-CM | POA: Diagnosis not present

## 2019-01-21 DIAGNOSIS — R1310 Dysphagia, unspecified: Secondary | ICD-10-CM | POA: Diagnosis present

## 2019-01-21 DIAGNOSIS — T82510A Breakdown (mechanical) of surgically created arteriovenous fistula, initial encounter: Secondary | ICD-10-CM | POA: Diagnosis not present

## 2019-01-21 DIAGNOSIS — G8929 Other chronic pain: Secondary | ICD-10-CM | POA: Diagnosis present

## 2019-01-21 DIAGNOSIS — Z833 Family history of diabetes mellitus: Secondary | ICD-10-CM

## 2019-01-21 DIAGNOSIS — E1151 Type 2 diabetes mellitus with diabetic peripheral angiopathy without gangrene: Secondary | ICD-10-CM | POA: Diagnosis present

## 2019-01-21 DIAGNOSIS — E1142 Type 2 diabetes mellitus with diabetic polyneuropathy: Secondary | ICD-10-CM | POA: Diagnosis present

## 2019-01-21 DIAGNOSIS — N179 Acute kidney failure, unspecified: Principal | ICD-10-CM | POA: Diagnosis present

## 2019-01-21 DIAGNOSIS — E860 Dehydration: Secondary | ICD-10-CM | POA: Diagnosis present

## 2019-01-21 DIAGNOSIS — Z20828 Contact with and (suspected) exposure to other viral communicable diseases: Secondary | ICD-10-CM | POA: Diagnosis present

## 2019-01-21 DIAGNOSIS — Y712 Prosthetic and other implants, materials and accessory cardiovascular devices associated with adverse incidents: Secondary | ICD-10-CM | POA: Diagnosis not present

## 2019-01-21 DIAGNOSIS — N186 End stage renal disease: Secondary | ICD-10-CM | POA: Diagnosis present

## 2019-01-21 DIAGNOSIS — E11649 Type 2 diabetes mellitus with hypoglycemia without coma: Secondary | ICD-10-CM | POA: Diagnosis not present

## 2019-01-21 DIAGNOSIS — Z91041 Radiographic dye allergy status: Secondary | ICD-10-CM

## 2019-01-21 DIAGNOSIS — Z91048 Other nonmedicinal substance allergy status: Secondary | ICD-10-CM

## 2019-01-21 DIAGNOSIS — Z09 Encounter for follow-up examination after completed treatment for conditions other than malignant neoplasm: Secondary | ICD-10-CM

## 2019-01-21 DIAGNOSIS — N2581 Secondary hyperparathyroidism of renal origin: Secondary | ICD-10-CM | POA: Diagnosis present

## 2019-01-21 DIAGNOSIS — E872 Acidosis: Secondary | ICD-10-CM | POA: Diagnosis present

## 2019-01-21 DIAGNOSIS — E1122 Type 2 diabetes mellitus with diabetic chronic kidney disease: Secondary | ICD-10-CM | POA: Diagnosis present

## 2019-01-21 DIAGNOSIS — Z79890 Hormone replacement therapy (postmenopausal): Secondary | ICD-10-CM

## 2019-01-21 DIAGNOSIS — E11319 Type 2 diabetes mellitus with unspecified diabetic retinopathy without macular edema: Secondary | ICD-10-CM | POA: Diagnosis present

## 2019-01-21 DIAGNOSIS — Z841 Family history of disorders of kidney and ureter: Secondary | ICD-10-CM

## 2019-01-21 DIAGNOSIS — Z87891 Personal history of nicotine dependence: Secondary | ICD-10-CM

## 2019-01-21 DIAGNOSIS — M109 Gout, unspecified: Secondary | ICD-10-CM | POA: Diagnosis present

## 2019-01-21 DIAGNOSIS — Z888 Allergy status to other drugs, medicaments and biological substances status: Secondary | ICD-10-CM

## 2019-01-21 DIAGNOSIS — Z7982 Long term (current) use of aspirin: Secondary | ICD-10-CM

## 2019-01-21 DIAGNOSIS — R339 Retention of urine, unspecified: Secondary | ICD-10-CM

## 2019-01-21 DIAGNOSIS — N401 Enlarged prostate with lower urinary tract symptoms: Secondary | ICD-10-CM | POA: Diagnosis present

## 2019-01-21 DIAGNOSIS — E876 Hypokalemia: Secondary | ICD-10-CM | POA: Diagnosis present

## 2019-01-21 DIAGNOSIS — R778 Other specified abnormalities of plasma proteins: Secondary | ICD-10-CM | POA: Diagnosis present

## 2019-01-21 LAB — CBC
HCT: 39.5 % (ref 39.0–52.0)
HCT: 35.7 % — ABNORMAL LOW (ref 39.0–52.0)
Hemoglobin: 13 g/dL (ref 13.0–17.0)
Hemoglobin: 12 g/dL — ABNORMAL LOW (ref 13.0–17.0)
MCH: 31 pg (ref 26.0–34.0)
MCH: 31.1 pg (ref 26.0–34.0)
MCHC: 32.9 g/dL (ref 30.0–36.0)
MCHC: 33.6 g/dL (ref 30.0–36.0)
MCV: 94.3 fL (ref 80.0–100.0)
MCV: 92.5 fL (ref 80.0–100.0)
Platelets: 198 10*3/uL (ref 150–400)
Platelets: 175 10*3/uL (ref 150–400)
RBC: 4.19 MIL/uL — ABNORMAL LOW (ref 4.22–5.81)
RBC: 3.86 MIL/uL — ABNORMAL LOW (ref 4.22–5.81)
RDW: 13.8 % (ref 11.5–15.5)
RDW: 13.7 % (ref 11.5–15.5)
WBC: 10 10*3/uL (ref 4.0–10.5)
WBC: 8.9 10*3/uL (ref 4.0–10.5)
nRBC: 0 % (ref 0.0–0.2)
nRBC: 0 % (ref 0.0–0.2)

## 2019-01-21 LAB — BASIC METABOLIC PANEL
Anion gap: 19 — ABNORMAL HIGH (ref 5–15)
BUN: 124 mg/dL — ABNORMAL HIGH (ref 8–23)
CO2: 18 mmol/L — ABNORMAL LOW (ref 22–32)
Calcium: 8.8 mg/dL — ABNORMAL LOW (ref 8.9–10.3)
Chloride: 100 mmol/L (ref 98–111)
Creatinine, Ser: 6.52 mg/dL — ABNORMAL HIGH (ref 0.61–1.24)
GFR calc Af Amer: 9 mL/min — ABNORMAL LOW (ref >60)
GFR calc non Af Amer: 7 mL/min — ABNORMAL LOW (ref >60)
Glucose, Bld: 155 mg/dL — ABNORMAL HIGH (ref 70–99)
Potassium: 3.2 mmol/L — ABNORMAL LOW (ref 3.5–5.1)
Sodium: 137 mmol/L (ref 135–145)

## 2019-01-21 LAB — URINALYSIS, ROUTINE W REFLEX MICROSCOPIC
Bacteria, UA: NONE SEEN
Bilirubin Urine: NEGATIVE
Glucose, UA: 50 mg/dL — AB
Ketones, ur: 5 mg/dL — AB
Leukocytes,Ua: NEGATIVE
Nitrite: NEGATIVE
Protein, ur: >=300 mg/dL — AB
Specific Gravity, Urine: 1.013 (ref 1.005–1.030)
pH: 5 (ref 5.0–8.0)

## 2019-01-21 LAB — GLUCOSE, CAPILLARY: Glucose-Capillary: 100 mg/dL — ABNORMAL HIGH (ref 70–99)

## 2019-01-21 LAB — CBG MONITORING, ED: Glucose-Capillary: 143 mg/dL — ABNORMAL HIGH (ref 70–99)

## 2019-01-21 LAB — TROPONIN I
Troponin I: 0.07 ng/mL (ref <0.03)
Troponin I: 0.08 ng/mL (ref <0.03)

## 2019-01-21 LAB — SARS CORONAVIRUS 2 BY RT PCR (HOSPITAL ORDER, PERFORMED IN ~~LOC~~ HOSPITAL LAB): SARS Coronavirus 2: NEGATIVE

## 2019-01-21 MED ORDER — ACETAMINOPHEN 650 MG RE SUPP: 650.0000 mg | Freq: Four times a day (QID) | RECTAL | Status: DC | PRN

## 2019-01-21 MED ORDER — SODIUM BICARBONATE 650 MG PO TABS
650.0000 mg | ORAL_TABLET | Freq: Two times a day (BID) | ORAL | Status: DC
  Administered 2019-01-21 – 2019-01-23 (×4): 650 mg via ORAL
  Filled 2019-01-21 (×5): qty 1

## 2019-01-21 MED ORDER — TAMSULOSIN HCL 0.4 MG PO CAPS
0.4000 mg | ORAL_CAPSULE | Freq: Every day | ORAL | Status: DC
  Administered 2019-01-22 – 2019-01-28 (×7): 0.4 mg via ORAL
  Filled 2019-01-21 (×7): qty 1

## 2019-01-21 MED ORDER — ALLOPURINOL 100 MG PO TABS
100.0000 mg | ORAL_TABLET | Freq: Every day | ORAL | Status: DC
  Administered 2019-01-22 – 2019-01-28 (×7): 100 mg via ORAL
  Filled 2019-01-21 (×7): qty 1

## 2019-01-21 MED ORDER — INSULIN ASPART 100 UNIT/ML ~~LOC~~ SOLN
0.0000 [IU] | Freq: Three times a day (TID) | SUBCUTANEOUS | Status: DC
  Administered 2019-01-22 (×2): 1 [IU] via SUBCUTANEOUS
  Administered 2019-01-22: 3 [IU] via SUBCUTANEOUS
  Administered 2019-01-23: 1 [IU] via SUBCUTANEOUS
  Administered 2019-01-24: 2 [IU] via SUBCUTANEOUS
  Administered 2019-01-24 – 2019-01-25 (×2): 5 [IU] via SUBCUTANEOUS
  Administered 2019-01-25: 1 [IU] via SUBCUTANEOUS
  Administered 2019-01-26: 2 [IU] via SUBCUTANEOUS
  Administered 2019-01-26: 1 [IU] via SUBCUTANEOUS
  Administered 2019-01-28: 3 [IU] via SUBCUTANEOUS

## 2019-01-21 MED ORDER — HEPARIN SODIUM (PORCINE) 5000 UNIT/ML IJ SOLN
5000.0000 [IU] | Freq: Three times a day (TID) | INTRAMUSCULAR | Status: DC
  Administered 2019-01-21 – 2019-01-28 (×15): 5000 [IU] via SUBCUTANEOUS
  Filled 2019-01-21 (×18): qty 1

## 2019-01-21 MED ORDER — LEVOTHYROXINE SODIUM 75 MCG PO TABS
150.0000 ug | ORAL_TABLET | Freq: Every day | ORAL | Status: DC
  Administered 2019-01-22 – 2019-01-28 (×7): 150 ug via ORAL
  Filled 2019-01-21 (×8): qty 2

## 2019-01-21 MED ORDER — GABAPENTIN 300 MG PO CAPS
300.0000 mg | ORAL_CAPSULE | Freq: Every day | ORAL | Status: DC
  Administered 2019-01-21 – 2019-01-28 (×6): 300 mg via ORAL
  Filled 2019-01-21 (×7): qty 1

## 2019-01-21 MED ORDER — CALCITRIOL 0.25 MCG PO CAPS
0.2500 ug | ORAL_CAPSULE | Freq: Every day | ORAL | Status: DC
  Administered 2019-01-22 – 2019-01-28 (×7): 0.25 ug via ORAL
  Filled 2019-01-21 (×7): qty 1

## 2019-01-21 MED ORDER — ACETAMINOPHEN 325 MG PO TABS
650.0000 mg | ORAL_TABLET | Freq: Four times a day (QID) | ORAL | Status: DC | PRN
  Administered 2019-01-24: 650 mg via ORAL

## 2019-01-21 MED ORDER — SODIUM CHLORIDE 0.9% FLUSH
3.0000 mL | Freq: Once | INTRAVENOUS | Status: AC
  Administered 2019-01-21: 3 mL via INTRAVENOUS

## 2019-01-21 MED ORDER — HYDROCODONE-ACETAMINOPHEN 5-325 MG PO TABS
1.0000 | ORAL_TABLET | Freq: Four times a day (QID) | ORAL | Status: DC | PRN
  Administered 2019-01-22 – 2019-01-28 (×16): 1 via ORAL
  Filled 2019-01-21 (×15): qty 1

## 2019-01-21 MED ORDER — FUROSEMIDE 20 MG PO TABS: 20.0000 mg | ORAL_TABLET | Freq: Every day | ORAL | Status: DC

## 2019-01-21 MED ORDER — RENA-VITE PO TABS
1.0000 | ORAL_TABLET | Freq: Every day | ORAL | Status: DC
  Administered 2019-01-22 – 2019-01-28 (×7): 1 via ORAL
  Filled 2019-01-21 (×6): qty 1

## 2019-01-21 MED ORDER — ASPIRIN EC 81 MG PO TBEC
81.0000 mg | DELAYED_RELEASE_TABLET | Freq: Every day | ORAL | Status: DC
  Administered 2019-01-22 – 2019-01-28 (×7): 81 mg via ORAL
  Filled 2019-01-21 (×7): qty 1

## 2019-01-21 MED ORDER — ATORVASTATIN CALCIUM 10 MG PO TABS
20.0000 mg | ORAL_TABLET | Freq: Every day | ORAL | Status: DC
  Administered 2019-01-22 – 2019-01-28 (×7): 20 mg via ORAL
  Filled 2019-01-21 (×7): qty 2

## 2019-01-21 NOTE — ED Notes (Signed)
EDP aware of elevated trop

## 2019-01-21 NOTE — ED Notes (Signed)
ED TO INPATIENT HANDOFF REPORT  ED Nurse Name and Phone #: Hannie [phone]  S Name/Age/Gender Frank [name] 78 y.o. male Room/Bed: 046C/046C  Code Status   Code Status: Not on file  Home/SNF/Other Home Patient oriented to: self, place, time and situation Is this baseline? Yes   Triage Complete: Triage complete  Chief Complaint need dialysis  Triage Note Pt in with c/o generalized weakness. Has hx of CKD, is possibly starting dialysis soon, L arm fistula still maturing. Crt elevated greater than 6 per Nephrologist today. Denies any sob or cp   Allergies Allergies  Allergen Reactions  . Ace Inhibitors Other (See Comments)    Hyperkalemia on ACE INHIBITORS Cr>1.9  . Adhesive [Tape] Other (See Comments)    Can tolerate ONLY Coban wrap or mild paper tape!!  . Angiotensin Receptor Blockers Other (See Comments)    Hyperkalemia   . Iodine-131 Other (See Comments)    Cannot have due to renal failure   . Ivp Dye [Iodinated Diagnostic Agents] Other (See Comments)    Cannot have due to renal failure  . Other Other (See Comments)    NO IV dyes due to renal failure     Level of Care/Admitting Diagnosis ED Disposition    ED Disposition Condition Comment   Admit  Hospital Area: [facility] [[id number]]  Level of Care: Telemetry Medical [104]  I expect the patient will be discharged within 24 hours: No (not a candidate for 5C-Observation unit)  Covid Evaluation: N/A  Diagnosis: Near syncope [010272]  Admitting Physician: Rise Patience [phone]  Attending Physician: Rise Patience [[id number]]  PT Class (Do Not Modify): Observation [104]  PT Acc Code (Do Not Modify): Observation [10022]       B Medical/Surgery History Past Medical History:  Diagnosis Date  . Anemia of chronic kidney failure   . Bradycardia   . Chronic kidney disease    [facility]  . Diabetes mellitus without complication (HCC)    diet controlled   . Diabetic nephropathy ([place])    . Gout   . Gout   . Hyperlipidemia   . Hypothyroidism   . Neuropathy   . Peripheral neuropathy   . Peripheral vascular disease ([place])   . Secondary hyperparathyroidism ([place])    Renal  . Thyroid disease    Past Surgical History:  Procedure Laterality Date  . AV FISTULA PLACEMENT Left 11/19/2017   Procedure: ARTERIOVENOUS (AV) FISTULA CREATION;  Surgeon: [name], MD;  Location: [facility];  Service: Vascular;  Laterality: Left;  . BASCILIC VEIN TRANSPOSITION Left 06/04/2018   Procedure: BASILIC VEIN TRANSPOSITION ARM;  Surgeon: [name], MD;  Location: [facility];  Service: Vascular;  Laterality: Left;  . COLONOSCOPY    . EYE SURGERY     cataract surgery  . KNEE ARTHROSCOPY Left    Knee     A IV Location/Drains/Wounds Patient Lines/Drains/Airways Status   Active Line/Drains/Airways    Name:   Placement date:   Placement time:   Site:   Days:   Peripheral IV 01/21/19 Right Forearm   01/21/19    1559    Forearm   less than 1   Fistula / Graft Left Upper arm Arteriovenous fistula   11/19/17    0817    Upper arm   428   Incision (Closed) 11/19/17 Arm Left   11/19/17    0722     428   Incision (Closed) 06/04/18 Arm Left   06/04/18  0753     231          Intake/Output Last 24 hours No intake or output data in the 24 hours ending 01/21/19 2024  Labs/Imaging Results for orders placed or performed during the hospital encounter of 01/21/19 (from the past 48 hour(s))  Basic metabolic panel     Status: Abnormal   Collection Time: 01/21/19  3:01 PM  Result Value Ref Range   Sodium 137 135 - 145 mmol/L   Potassium 3.2 (L) 3.5 - 5.1 mmol/L   Chloride 100 98 - 111 mmol/L   CO2 18 (L) 22 - 32 mmol/L   Glucose, Bld 155 (H) 70 - 99 mg/dL   BUN 124 (H) 8 - 23 mg/dL   Creatinine, Ser 6.52 (H) 0.61 - 1.24 mg/dL   Calcium 8.8 (L) 8.9 - 10.3 mg/dL   GFR calc non Af Amer 7 (L) >60 mL/min   GFR calc Af Amer 9 (L) >60 mL/min   Anion gap 19 (H) 5 - 15    Comment:  Performed at [facility], [address], [place], [place] [address]  CBC     Status: Abnormal   Collection Time: 01/21/19  3:01 PM  Result Value Ref Range   WBC 10.0 4.0 - 10.5 K/uL   RBC 4.19 (L) 4.22 - 5.81 MIL/uL   Hemoglobin 13.0 13.0 - 17.0 g/dL   HCT 39.5 39.0 - 52.0 %   MCV 94.3 80.0 - 100.0 fL   MCH 31.0 26.0 - 34.0 pg   MCHC 32.9 30.0 - 36.0 g/dL   RDW 13.8 11.5 - 15.5 %   Platelets 198 150 - 400 K/uL   nRBC 0.0 0.0 - 0.2 %    Comment: Performed at [facility], [place] [address] [place], [place] [address]  Troponin I - Once     Status: Abnormal   Collection Time: 01/21/19  3:01 PM  Result Value Ref Range   Troponin I 0.07 (HH) <0.03 ng/mL    Comment: CRITICAL RESULT CALLED TO, READ BACK BY AND VERIFIED WITH: H.Janvi Ammar RN [id number] 01/21/2019 MCCORMICK K Performed at [facility], [place] [address], [place], [place] [address]   CBG monitoring, ED     Status: Abnormal   Collection Time: 01/21/19  3:16 PM  Result Value Ref Range   Glucose-Capillary 143 (H) 70 - 99 mg/dL  Urinalysis, Routine w reflex microscopic     Status: Abnormal   Collection Time: 01/21/19  3:51 PM  Result Value Ref Range   Color, Urine YELLOW YELLOW   APPearance HAZY (A) CLEAR   Specific Gravity, Urine 1.013 1.005 - 1.030   pH 5.0 5.0 - 8.0   Glucose, UA 50 (A) NEGATIVE mg/dL   Hgb urine dipstick MODERATE (A) NEGATIVE   Bilirubin Urine NEGATIVE NEGATIVE   Ketones, ur 5 (A) NEGATIVE mg/dL   Protein, ur >=300 (A) NEGATIVE mg/dL   Nitrite NEGATIVE NEGATIVE   Leukocytes,Ua NEGATIVE NEGATIVE   RBC / HPF 0-5 0 - 5 RBC/hpf   WBC, UA 0-5 0 - 5 WBC/hpf   Bacteria, UA NONE SEEN NONE SEEN   Mucus PRESENT    Hyaline Casts, UA PRESENT     Comment: Performed at [facility], [address] [place],  [address]  SARS Coronavirus 2 [facility] order, Performed in [facility] hospital lab)     Status: None   Collection Time: 01/21/19  4:24 PM  Result Value Ref Range  SARS  Coronavirus 2 NEGATIVE NEGATIVE    Comment: (NOTE) If result is NEGATIVE SARS-CoV-2 target nucleic acids are NOT DETECTED. The SARS-CoV-2 RNA is generally detectable in upper and lower  respiratory specimens during the acute phase of infection. The lowest  concentration of SARS-CoV-2 viral copies this assay can detect is 250  copies / mL. A negative result does not preclude SARS-CoV-2 infection  and should not be used as the sole basis for treatment or other  patient management decisions.  A negative result may occur with  improper specimen collection / handling, submission of specimen other  than nasopharyngeal swab, presence of viral mutation(s) within the  areas targeted by this assay, and inadequate number of viral copies  (<250 copies / mL). A negative result must be combined with clinical  observations, patient history, and epidemiological information. If result is POSITIVE SARS-CoV-2 target nucleic acids are DETECTED. The SARS-CoV-2 RNA is generally detectable in upper and lower  respiratory specimens dur ing the acute phase of infection.  Positive  results are indicative of active infection with SARS-CoV-2.  Clinical  correlation with patient history and other diagnostic information is  necessary to determine patient infection status.  Positive results do  not rule out bacterial infection or co-infection with other viruses. If result is PRESUMPTIVE POSTIVE SARS-CoV-2 nucleic acids MAY BE PRESENT.   A presumptive positive result was obtained on the submitted specimen  and confirmed on repeat testing.  While 2019 novel coronavirus  (SARS-CoV-2) nucleic acids may be present in the submitted sample  additional confirmatory testing may be necessary for epidemiological  and / or clinical management purposes  to differentiate between  SARS-CoV-2 and other Sarbecovirus currently known to infect humans.  If clinically indicated additional testing with an alternate test  methodology  [phone]) is advised. The SARS-CoV-2 RNA is generally  detectable in upper and lower respiratory sp ecimens during the acute  phase of infection. The expected result is Negative. Fact Sheet for Patients:  StrictlyIdeas.no Fact Sheet for Healthcare Providers: BankingDealers.co.za This test is not yet approved or cleared by the [place] FDA and has been authorized for detection and/or diagnosis of SARS-CoV-2 by FDA under an Emergency Use Authorization (EUA).  This EUA will remain in effect (meaning this test can be used) for the duration of the COVID-19 declaration under Section 564(b)(1) of the Act, 21 U.S.C. section 360bbb-3(b)(1), unless the authorization is terminated or revoked sooner. Performed at [facility], [place] [address], [place], [place] [address]   Troponin I - ONCE - STAT     Status: Abnormal   Collection Time: 01/21/19  6:41 PM  Result Value Ref Range   Troponin I 0.08 (HH) <0.03 ng/mL    Comment: CRITICAL VALUE NOTED.  VALUE IS CONSISTENT WITH PREVIOUSLY REPORTED AND CALLED VALUE. Performed at [facility], [place] [address], [place], [place] [address]    Dg Chest Port 1 View  Result Date: 01/21/2019 CLINICAL DATA:  Generalized weakness EXAM: PORTABLE CHEST 1 VIEW COMPARISON:  None. FINDINGS: Elevated right diaphragm with mild right lower lobe atelectasis. Lungs otherwise clear. Negative for heart failure. IMPRESSION: Mild right lower lobe atelectasis. Electronically Signed   By: Franchot Gallo M.D.   On: 01/21/2019 17:36    Pending Labs Unresulted Labs (From admission, onward)    Start     Ordered   01/21/19 1717  Urine Culture  Add-on,   STAT     01/21/19 1716  Vitals/Pain Today's Vitals   01/21/19 1830 01/21/19 1833 01/21/19 1845 01/21/19 2015  BP: 132/72  135/81 (!) 148/85  Pulse: 77  84 88  Resp: 17  17 15   Temp:      TempSrc:      SpO2: 96%  97% 97%  PainSc:  0-No pain       Isolation Precautions No active isolations  Medications Medications  sodium chloride flush (NS) 0.9 % injection 3 mL (3 mLs Intravenous Given 01/21/19 1559)    Mobility walks     Focused Assessments .   R Recommendations: See Admitting Provider Note  Report given to:   Additional Notes:

## 2019-01-21 NOTE — ED Provider Notes (Signed)
[facility] EMERGENCY DEPARTMENT Provider Note   CSN: [id number] Arrival date & time: 01/21/19  1433    History   Chief Complaint Chief Complaint  Patient presents with  . Abnormal Lab  . Weakness    HPI Frank [name] is a 78 y.o. male with a PMH of CKD, diet controlled diabetes, HLD, and thyroid disease presenting due to an abnormal lab and generalized weakness onset 5 days ago. Daughter is a contributing historian. Daughter reports patient was evaluated by PCP for intermittent nausea and vomiting 5 days ago. Daughter states nausea and vomiting have resolved. Daughter states patient is being followed by [facility], but has not started dialysis yet. Patient has a left arm fistula. Daughter reports patient had a gait abnormality on Monday, but states symptoms have resolved. Patient reports he had an episode earlier this morning when he felt, "Like I was going to die." Patient describes event as feeling lightheaded and presyncopal. Patient denies nausea, vomiting, or abdominal pain today. Patient denies shortness of breath or chest pain. Patient denies vision changes, dizziness, headaches, numbness, or paresthesias. Patient denies difficulty ambulating today. Patient denies leg edema or pain. Patient denies dysuria, hematuria, or flank pain. Patient reports he is able to urinate. Patient denies fever, cough, congestion, sick exposures, or recent travel.      HPI  Past Medical History:  Diagnosis Date  . Anemia of chronic kidney failure   . Bradycardia   . Chronic kidney disease    [facility]  . Diabetes mellitus without complication (HCC)    diet controlled   . Diabetic nephropathy ([place])   . Gout   . Gout   . Hyperlipidemia   . Hypothyroidism   . Neuropathy   . Peripheral neuropathy   . Peripheral vascular disease ([place])   . Secondary hyperparathyroidism ([place])    Renal  . Thyroid disease     Patient Active Problem List   Diagnosis Date Noted  .  Near syncope 01/21/2019  . Itching 10/18/2016  . Osteoarthritis of midfoot, right 08/13/2016  . Foot swelling 07/04/2016  . DM type 2 with diabetic peripheral neuropathy ([place]) 11/28/2015  . Pre-transplant evaluation for kidney transplant 11/28/2015  . HLD (hyperlipidemia) 08/01/2015  . 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 Chronic idiopathic gout of multiple sites 02/10/2015  . Illiterate 11/19/2013  . Chronic, continuous use of opioids 09/17/2013  . CKD (chronic kidney disease), stage IV ([place]) 05/07/2013  . Essential hypertension 05/07/2013  . Osteoarthritis 05/07/2013  . Diastasis recti 10/29/2012  . 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 ED (erectile dysfunction) 08/28/2012  . Cataracts, bilateral 05/15/2012  . Chronic pain 05/15/2012  . CRF (chronic renal failure) 05/15/2012  . DM (diabetes mellitus) ([place]) 05/15/2012  . Gout of knee 05/15/2012  . Hyperkalemia 05/15/2012  . Hypothyroidism 05/15/2012    Past Surgical History:  Procedure Laterality Date  . AV FISTULA PLACEMENT Left 11/19/2017   Procedure: ARTERIOVENOUS (AV) FISTULA CREATION;  Surgeon: [name], MD;  Location: [facility];  Service: Vascular;  Laterality: Left;  . BASCILIC VEIN TRANSPOSITION Left 06/04/2018   Procedure: BASILIC VEIN TRANSPOSITION ARM;  Surgeon: [name], MD;  Location: [facility];  Service: Vascular;  Laterality: Left;  . COLONOSCOPY    . EYE SURGERY     cataract surgery  . KNEE ARTHROSCOPY Left    Knee        Home Medications  Prior to Admission medications   Medication Sig Start Date End Date Taking? Authorizing Provider  allopurinol (ZYLOPRIM) 100 MG tablet Take 100 mg by mouth daily.  09/12/13  Yes [provider]  aspirin EC 81 MG tablet Take 81 mg daily by mouth.   Yes [provider]  atorvastatin (LIPITOR) 20 MG tablet Take 20 mg daily by  mouth.   Yes [provider]  calcitRIOL (ROCALTROL) 0.25 MCG capsule Take 0.25 mcg by mouth daily.   Yes [provider]  furosemide (LASIX) 20 MG tablet Take 20 mg by mouth daily.  11/25/13  Yes [provider]  gabapentin (NEURONTIN) 100 MG capsule Take 300 mg by mouth daily at 3 pm.    Yes [provider]  HYDROcodone-acetaminophen (NORCO) 10-325 MG tablet Take 0.5 tablets by mouth See admin instructions. Take 0.5 tablet by mouth up to 6 times a day as needed for pain   Yes [provider]  levothyroxine (SYNTHROID) 150 MCG tablet Take 150 mcg by mouth daily before breakfast.   Yes [provider]  multivitamin (RENA-VIT) TABS tablet Take 1 tablet daily by mouth.   Yes [provider]  ondansetron (ZOFRAN) 4 MG tablet Take 4 mg by mouth every 8 (eight) hours as needed for nausea or vomiting.  01/19/19  Yes [provider]  sodium bicarbonate 650 MG tablet Take 650 mg by mouth 2 (two) times daily. 01/12/19  Yes [provider]  tamsulosin (FLOMAX) 0.4 MG CAPS capsule Take 0.4 mg by mouth daily.    Yes [provider]  Zinc Oxide (BALMEX EX) Apply 1 application topically daily as needed (to irritated areas of skin).    Yes [provider]  HYDROcodone-acetaminophen (NORCO) 5-325 MG tablet Take 1 tablet by mouth every 6 (six) hours as needed for moderate pain. 06/04/18   Dagoberto Ligas, PA-C  oxyCODONE (OXY IR/ROXICODONE) 5 MG immediate release tablet Take 5 mg by mouth every 4 (four) hours as needed (for pain).  12/30/18   [provider]    Family History Family History  Problem Relation Age of Onset  . Diabetes Mother   . Kidney disease Mother   . Liver disease Mother   . Heart Problems Mother     Social History Social History   Tobacco Use  . Smoking status: Former Smoker    Years: 30.00    Last attempt to quit: 1994    Years since quitting: 26.3  . Smokeless tobacco: Never  Used  Substance Use Topics  . Alcohol use: No  . Drug use: No     Allergies   Ace inhibitors; Adhesive [tape]; Angiotensin receptor blockers; Iodine-131; Ivp dye [iodinated diagnostic agents]; and Other   Review of Systems Review of Systems  Constitutional: Negative for activity change, appetite change, chills, diaphoresis, fatigue, fever and unexpected weight change.  HENT: Negative for congestion, rhinorrhea and sore throat.   Eyes: Negative for photophobia and visual disturbance.  Respiratory: Negative for cough, chest tightness and shortness of breath.   Cardiovascular: Negative for chest pain, palpitations and leg swelling.  Gastrointestinal: Positive for nausea and vomiting. Negative for abdominal pain, blood in stool and diarrhea.  Endocrine: Negative for cold intolerance and heat intolerance.  Genitourinary: Negative for dysuria, flank pain and frequency.  Musculoskeletal: Positive for gait problem. Negative for back pain, myalgias and neck pain.  Skin: Negative for wound.  Neurological: Positive for weakness and light-headedness. Negative for dizziness, tremors, seizures, syncope, facial asymmetry, speech difficulty,  numbness and headaches.       Pt reports a presyncopal episode.  Psychiatric/Behavioral: Negative for confusion, dysphoric mood and sleep disturbance. The patient is not nervous/anxious.      Physical Exam Updated Vital Signs BP (!) 148/85   Pulse 88   Temp 99 F (37.2 C) (Oral)   Resp 15   SpO2 97%   Physical Exam Vitals signs and nursing note reviewed.  Constitutional:      General: He is not in acute distress.    Appearance: He is well-developed. He is not diaphoretic.     Comments: Patient is sitting up in bed in no acute distress. Patient is cooperative and conversational throughout history and physical exam.   HENT:     Head: Normocephalic and atraumatic.     Nose: Nose normal. No congestion or rhinorrhea.     Mouth/Throat:     Mouth: Mucous  membranes are moist.     Pharynx: No oropharyngeal exudate or posterior oropharyngeal erythema.  Eyes:     Extraocular Movements: Extraocular movements intact.     Conjunctiva/sclera: Conjunctivae normal.     Pupils: Pupils are equal, round, and reactive to light.  Neck:     Musculoskeletal: Normal range of motion.  Cardiovascular:     Rate and Rhythm: Normal rate and regular rhythm.     Heart sounds: Normal heart sounds. No murmur. No friction rub. No gallop.   Pulmonary:     Effort: Pulmonary effort is normal. No respiratory distress.     Breath sounds: Normal breath sounds. No wheezing or rales.  Abdominal:     Palpations: Abdomen is soft.     Tenderness: There is no abdominal tenderness.  Musculoskeletal: Normal range of motion.     Right lower leg: No edema.     Left lower leg: No edema.  Skin:    General: Skin is warm.     Findings: No erythema or rash.     Comments: Fistula present in left arm. Pulse present without a thrill.  Neurological:     Mental Status: He is alert.    Mental Status:  Alert, oriented, thought content appropriate, able to give a coherent history. Speech fluent without evidence of aphasia. Able to follow 2 step commands without difficulty.  Cranial Nerves:  II:  Peripheral visual fields grossly normal, pupils equal, round, reactive to light III,IV, VI: ptosis not present, extra-ocular motions intact bilaterally  V,VII: smile symmetric, facial light touch sensation equal VIII: hearing grossly normal to voice  IX,X: symmetric elevation of soft palate, uvula elevates symmetrically  XI: bilateral shoulder shrug symmetric and strong XII: midline tongue extension without fassiculations Motor:  Normal tone. 5/5 in upper and lower extremities bilaterally including strong and equal grip strength and dorsiflexion/plantar flexion Sensory: Pinprick and light touch normal in all extremities.  Deep Tendon Reflexes: 2+ and symmetric in the biceps and patella  Cerebellar: normal finger-to-nose with bilateral upper extremities Gait: normal gait and balance. Patient denies any trouble ambulating. Negative pronator drift. Negative Romberg sign. CV: distal pulses palpable throughout   ED Treatments / Results  Labs (all labs ordered are listed, but only abnormal results are displayed) Labs Reviewed  BASIC METABOLIC PANEL - Abnormal; Notable for the following components:      Result Value   Potassium 3.2 (*)    CO2 18 (*)    Glucose, Bld 155 (*)    BUN 124 (*)    Creatinine, Ser 6.52 (*)    Calcium 8.8 (*)  GFR calc non Af Amer 7 (*)    GFR calc Af Amer 9 (*)    Anion gap 19 (*)    All other components within normal limits  CBC - Abnormal; Notable for the following components:   RBC 4.19 (*)    All other components within normal limits  URINALYSIS, ROUTINE W REFLEX MICROSCOPIC - Abnormal; Notable for the following components:   APPearance HAZY (*)    Glucose, UA 50 (*)    Hgb urine dipstick MODERATE (*)    Ketones, ur 5 (*)    Protein, ur >=300 (*)    All other components within normal limits  TROPONIN I - Abnormal; Notable for the following components:   Troponin I 0.07 (*)    All other components within normal limits  TROPONIN I - Abnormal; Notable for the following components:   Troponin I 0.08 (*)    All other components within normal limits  CBG MONITORING, ED - Abnormal; Notable for the following components:   Glucose-Capillary 143 (*)    All other components within normal limits  SARS CORONAVIRUS 2 (HOSPITAL ORDER, [facility])  URINE CULTURE    EKG EKG Interpretation  Date/Time:  Wednesday Jan 21 2019 18:31:47 EDT Ventricular Rate:  79 PR Interval:    QRS Duration: 112 QT Interval:  443 QTC Calculation: 508 R Axis:   -50 Text Interpretation:  probable sinus rhythm with 1st degree AV block Left anterior fascicular block Left ventricular hypertrophy Anterior Q waves, possibly due to LVH  Prolonged QT interval similar to prior EKGs Confirmed by Malvin Johns [phone]) on 01/21/2019 6:37:14 PM   Radiology Dg Chest Port 1 View  Result Date: 01/21/2019 CLINICAL DATA:  Generalized weakness EXAM: PORTABLE CHEST 1 VIEW COMPARISON:  None. FINDINGS: Elevated right diaphragm with mild right lower lobe atelectasis. Lungs otherwise clear. Negative for heart failure. IMPRESSION: Mild right lower lobe atelectasis. Electronically Signed   By: Franchot Gallo M.D.   On: 01/21/2019 17:36    Procedures Procedures (including critical care time)  Medications Ordered in ED Medications  sodium chloride flush (NS) 0.9 % injection 3 mL (3 mLs Intravenous Given 01/21/19 1559)     Initial Impression / Assessment and Plan / ED Course  I have reviewed the triage vital signs and the nursing notes.  Pertinent labs & imaging results that were available during my care of the patient were reviewed by me and considered in my medical decision making (see chart for details).  Clinical Course as of Jan 21 2024  Wed Jan 21, 2019  1612 Hypokalemia noted at 3.2.  Potassium(!): 3.2 [AH]  1612 Elevated creatinine elevated at 6.52. GFR is 7.  Creatinine(!): 6.52 [AH]  1637 WBCs are within normal limits.  WBC: 10.0 [AH]  1638 UA reveals moderate Hgb, glucose, protein, and ketones.   Urinalysis, Routine w reflex microscopic(!) [AH]  1639 Troponin elevated at 0.07.  Troponin I(!!): 0.07 [AH]  1739 Mild right lower lobe atelectasis.  DG Chest Port 1 View [AH]    Clinical Course User Index [AH] Arville Lime, [place]      Patient presents with generalized weakness and an abnormal lab value (elevated creatinine). Vitals and labs reviewed. Creatinine elevated at 6.52 and hypokalemia noted at 3.2. Consulted nephrology due to increase in creatinine. Nephrology does not recommend dialysis at this time. Nephrology recommends outpatient follow up with Dr. Justin Mend in 1 week. Troponin elevated at 0.07. Repeat troponin  elevated at 0.08. Troponin  may be elevated due to CKD, but patient had a concerning episode this morning. Will admit due to elevated troponin and presyncopal/generalized weakness episode earlier today. Patient will need serial troponin/EKGs. Consulted hospitalist. Hospitalist has agreed to admit patient.   Findings and plan of care discussed with supervising physician Dr. Tamera Punt who personally evaluated and examined this patient.  Final Clinical Impressions(s) / ED Diagnoses   Final diagnoses:  Generalized weakness  Elevated troponin  Elevated serum creatinine    ED Discharge Orders    None       Arville Lime, [place] 01/21/19 2027    Malvin Johns, MD 01/30/19 2021

## 2019-01-21 NOTE — ED Triage Notes (Signed)
Pt in with c/o generalized weakness. Has hx of CKD, is possibly starting dialysis soon, L arm fistula still maturing. Crt elevated greater than 6 per Nephrologist today. Denies any sob or cp

## 2019-01-21 NOTE — H&P (Addendum)
History and Physical    Frank [name] IRJ:[id number] DOB: 02/11/1941 DOA: 01/21/2019  PCP: Chester Holstein, MD  Patient coming from: Home.  Chief Complaint: Abnormal labs.  Dizziness.  HPI: Frank [name] is a 78 y.o. male with history of chronic kidney disease stage V with left arm AV fistula follows with Dr. Justin Mend nephrologist per patient's daughter who provided history to the ER physician has been having intermittent nausea vomiting last few days and had followed up with his primary care physician who drew labs and found his creatinine be significantly elevated.  The labs available in care everywhere shows creatinine of 4.8 in January 2020.  In addition patient has been having some near syncopal episodes since this morning.  His vomiting is stopped denies any diarrhea chest pain shortness of breath.  ED Course: In the ER patient's labs show creatinine of 6.5 troponin 0.07 which was repeated again and was 0.08.  Hemoglobin 13 platelets 198 COVID-19 test was negative.  UA shows proteins 5 ketones.  EKG shows normal sinus rhythm.  Chest x-ray unremarkable.  Given the symptom where patient was feeling dizzy with mildly elevated troponin and worsening creatinine patient has been admitted for further observation.  At the time of my exam patient denies any chest pain or shortness of breath.  ER physician I discussed with on-call nephrologist who at this time advised follow-up as outpatient with Dr. Justin Mend.  Review of Systems: As per HPI, rest all negative.   Past Medical History:  Diagnosis Date  . Anemia of chronic kidney failure   . Bradycardia   . Chronic kidney disease    [facility]  . Diabetes mellitus without complication (HCC)    diet controlled   . Diabetic nephropathy ([place])   . Gout   . Gout   . Hyperlipidemia   . Hypothyroidism   . Neuropathy   . Peripheral neuropathy   . Peripheral vascular disease ([place])   . Secondary hyperparathyroidism ([place])    Renal  . Thyroid  disease     Past Surgical History:  Procedure Laterality Date  . AV FISTULA PLACEMENT Left 11/19/2017   Procedure: ARTERIOVENOUS (AV) FISTULA CREATION;  Surgeon: [name], MD;  Location: [facility];  Service: Vascular;  Laterality: Left;  . BASCILIC VEIN TRANSPOSITION Left 06/04/2018   Procedure: BASILIC VEIN TRANSPOSITION ARM;  Surgeon: [name], MD;  Location: [facility];  Service: Vascular;  Laterality: Left;  . COLONOSCOPY    . EYE SURGERY     cataract surgery  . KNEE ARTHROSCOPY Left    Knee     reports that he quit smoking about 26 years ago. He quit after 30.00 years of use. He has never used smokeless tobacco. He reports that he does not drink alcohol or use drugs.  Allergies  Allergen Reactions  . Ace Inhibitors Other (See Comments)    Hyperkalemia on ACE INHIBITORS Cr>1.9  . Adhesive [Tape] Other (See Comments)    Can tolerate ONLY Coban wrap or mild paper tape!!  . Angiotensin Receptor Blockers Other (See Comments)    Hyperkalemia   . Iodine-131 Other (See Comments)    Cannot have due to renal failure   . Ivp Dye [Iodinated Diagnostic Agents] Other (See Comments)    Cannot have due to renal failure  . Other Other (See Comments)    NO IV dyes due to renal failure     Family History  Problem Relation Age of Onset  . Diabetes Mother   .  Kidney disease Mother   . Liver disease Mother   . Heart Problems Mother     Prior to Admission medications   Medication Sig Start Date End Date Taking? Authorizing Provider  allopurinol (ZYLOPRIM) 100 MG tablet Take 100 mg by mouth daily.  09/12/13  Yes [provider]  aspirin EC 81 MG tablet Take 81 mg daily by mouth.   Yes [provider]  atorvastatin (LIPITOR) 20 MG tablet Take 20 mg daily by mouth.   Yes [provider]  calcitRIOL (ROCALTROL) 0.25 MCG capsule Take 0.25 mcg by mouth daily.   Yes [provider]  furosemide (LASIX) 20 MG tablet Take 20 mg by mouth  daily.  11/25/13  Yes [provider]  gabapentin (NEURONTIN) 100 MG capsule Take 300 mg by mouth daily at 3 pm.    Yes [provider]  HYDROcodone-acetaminophen (NORCO) 10-325 MG tablet Take 0.5 tablets by mouth See admin instructions. Take 0.5 tablet by mouth up to 6 times a day as needed for pain   Yes [provider]  levothyroxine (SYNTHROID) 150 MCG tablet Take 150 mcg by mouth daily before breakfast.   Yes [provider]  multivitamin (RENA-VIT) TABS tablet Take 1 tablet daily by mouth.   Yes [provider]  ondansetron (ZOFRAN) 4 MG tablet Take 4 mg by mouth every 8 (eight) hours as needed for nausea or vomiting.  01/19/19  Yes [provider]  sodium bicarbonate 650 MG tablet Take 650 mg by mouth 2 (two) times daily. 01/12/19  Yes [provider]  tamsulosin (FLOMAX) 0.4 MG CAPS capsule Take 0.4 mg by mouth daily.    Yes [provider]  Zinc Oxide (BALMEX EX) Apply 1 application topically daily as needed (to irritated areas of skin).    Yes [provider]  HYDROcodone-acetaminophen (NORCO) 5-325 MG tablet Take 1 tablet by mouth every 6 (six) hours as needed for moderate pain. 06/04/18   Dagoberto Ligas, PA-C  oxyCODONE (OXY IR/ROXICODONE) 5 MG immediate release tablet Take 5 mg by mouth every 4 (four) hours as needed (for pain).  12/30/18   [provider]    Physical Exam: Vitals:   01/21/19 1830 01/21/19 1845 01/21/19 2015 01/21/19 2116  BP: 132/72 135/81 (!) 148/85 (!) 155/88  Pulse: 77 84 88 90  Resp: 17 17 15 16   Temp:    98.2 F (36.8 C)  TempSrc:    Oral  SpO2: 96% 97% 97% 100%      Constitutional: Moderately built and nourished. Vitals:   01/21/19 1830 01/21/19 1845 01/21/19 2015 01/21/19 2116  BP: 132/72 135/81 (!) 148/85 (!) 155/88  Pulse: 77 84 88 90  Resp: 17 17 15 16   Temp:    98.2 F (36.8 C)  TempSrc:    Oral  SpO2: 96% 97% 97% 100%   Eyes: Anicteric no pallor.  ENMT: No discharge from the ears eyes nose and mouth. Neck: No mass felt.  No neck rigidity. Respiratory: No rhonchi or crepitations. Cardiovascular: S1-S2 heard. Abdomen: Soft nontender bowel sounds present. Musculoskeletal: No edema.  No joint effusion. Skin: No rash. Neurologic: Alert awake oriented to his name and place moves all extremities 5 x 5. Psychiatric: Oriented to name and place.   Labs on Admission: I have personally reviewed following labs and imaging studies  CBC: Recent Labs  Lab 01/21/19 1501  WBC 10.0  HGB 13.0  HCT 39.5  MCV 94.3  PLT 706   Basic Metabolic Panel:  Recent Labs  Lab 01/21/19 1501  NA 137  K 3.2*  CL 100  CO2 18*  GLUCOSE 155*  BUN 124*  CREATININE 6.52*  CALCIUM 8.8*   GFR: CrCl cannot be calculated (Unknown ideal weight.). Liver Function Tests: No results for input(s): AST, ALT, ALKPHOS, BILITOT, PROT, ALBUMIN in the last 168 hours. No results for input(s): LIPASE, AMYLASE in the last 168 hours. No results for input(s): AMMONIA in the last 168 hours. Coagulation Profile: No results for input(s): INR, PROTIME in the last 168 hours. Cardiac Enzymes: Recent Labs  Lab 01/21/19 1501 01/21/19 1841  TROPONINI 0.07* 0.08*   BNP (last 3 results) No results for input(s): PROBNP in the last 8760 hours. HbA1C: No results for input(s): HGBA1C in the last 72 hours. CBG: Recent Labs  Lab 01/21/19 1516 01/21/19 2125  GLUCAP 143* 100*   Lipid Profile: No results for input(s): CHOL, HDL, LDLCALC, TRIG, CHOLHDL, LDLDIRECT in the last 72 hours. Thyroid Function Tests: No results for input(s): TSH, T4TOTAL, FREET4, T3FREE, THYROIDAB in the last 72 hours. Anemia Panel: No results for input(s): VITAMINB12, FOLATE, FERRITIN, TIBC, IRON, RETICCTPCT in the last 72 hours. Urine analysis:    Component Value Date/Time   COLORURINE YELLOW 01/21/2019 1551   APPEARANCEUR HAZY (A) 01/21/2019 1551   LABSPEC 1.013 01/21/2019 1551   PHURINE  5.0 01/21/2019 1551   GLUCOSEU 50 (A) 01/21/2019 1551   HGBUR MODERATE (A) 01/21/2019 1551   BILIRUBINUR NEGATIVE 01/21/2019 1551   KETONESUR 5 (A) 01/21/2019 1551   PROTEINUR >=300 (A) 01/21/2019 1551   NITRITE NEGATIVE 01/21/2019 1551   LEUKOCYTESUR NEGATIVE 01/21/2019 1551   Sepsis Labs: @LABRCNTIP (procalcitonin:4,lacticidven:4) ) Recent Results (from the past 240 hour(s))  SARS Coronavirus 2 [facility] order, Performed in [place] hospital lab)     Status: None   Collection Time: 01/21/19  4:24 PM  Result Value Ref Range Status   SARS Coronavirus 2 NEGATIVE NEGATIVE Final    Comment: (NOTE) If result is NEGATIVE SARS-CoV-2 target nucleic acids are NOT DETECTED. The SARS-CoV-2 RNA is generally detectable in upper and lower  respiratory specimens during the acute phase of infection. The lowest  concentration of SARS-CoV-2 viral copies this assay can detect is 250  copies / mL. A negative result does not preclude SARS-CoV-2 infection  and should not be used as the sole basis for treatment or other  patient management decisions.  A negative result may occur with  improper specimen collection / handling, submission of specimen other  than nasopharyngeal swab, presence of viral mutation(s) within the  areas targeted by this assay, and inadequate number of viral copies  (<250 copies / mL). A negative result must be combined with clinical  observations, patient history, and epidemiological information. If result is POSITIVE SARS-CoV-2 target nucleic acids are DETECTED. The SARS-CoV-2 RNA is generally detectable in upper and lower  respiratory specimens dur ing the acute phase of infection.  Positive  results are indicative of active infection with SARS-CoV-2.  Clinical  correlation with patient history and other diagnostic information is  necessary to determine patient infection status.  Positive results do  not rule out bacterial infection or co-infection with other viruses. If  result is PRESUMPTIVE POSTIVE SARS-CoV-2 nucleic acids MAY BE PRESENT.   A presumptive positive result was obtained on the submitted specimen  and confirmed on repeat testing.  While 2019 novel coronavirus  (SARS-CoV-2) nucleic acids may be present in the submitted sample  additional confirmatory testing may be necessary for epidemiological  and / or clinical management purposes  to differentiate between  SARS-CoV-2 and other Sarbecovirus currently known to infect humans.  If clinically indicated additional testing with an alternate test  methodology [phone]) is advised. The SARS-CoV-2 RNA is generally  detectable in upper and lower respiratory sp ecimens during the acute  phase of infection. The expected result is Negative. Fact Sheet for Patients:  StrictlyIdeas.no Fact Sheet for Healthcare Providers: BankingDealers.co.za This test is not yet approved or cleared by the [place] FDA and has been authorized for detection and/or diagnosis of SARS-CoV-2 by FDA under an Emergency Use Authorization (EUA).  This EUA will remain in effect (meaning this test can be used) for the duration of the COVID-19 declaration under Section 564(b)(1) of the Act, 21 U.S.C. section 360bbb-3(b)(1), unless the authorization is terminated or revoked sooner. Performed at [facility], [place] [address] [place], [place] [address]      Radiological Exams on Admission: Dg Chest Port 1 View  Result Date: 01/21/2019 CLINICAL DATA:  Generalized weakness EXAM: PORTABLE CHEST 1 VIEW COMPARISON:  None. FINDINGS: Elevated right diaphragm with mild right lower lobe atelectasis. Lungs otherwise clear. Negative for heart failure. IMPRESSION: Mild right lower lobe atelectasis. Electronically Signed   By: Franchot Gallo M.D.   On: 01/21/2019 17:36    EKG: Independently reviewed.  Normal sinus rhythm.  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 Near syncopal episode with elevated troponin -denies any chest pain.  Patient is on statins and aspirin.  We will cycle cardiac markers.  Check orthostatics in the morning.  Check 2D echo. 2. Chronic kidney disease stage V with progressive worsening creatinine.  ER physician has discussed with on-call nephrologist who advised follow-up with Dr. Justin Mend patient's nephrologist as outpatient in 1 week's time.  Follow metabolic panel and holding of patient's Lasix for now may discuss with nephrology again in the morning. 3. Nausea vomiting has improved. Abdomen appears benign. Has tolerated diet. 4. Hypothyroidism on Synthroid. 5. Diabetes mellitus type 2 on diet. 6. Chronic pain.   DVT prophylaxis: Heparin. Code Status: Full code. Family Communication: ER physician discussed with patient's daughter. Addendum discussed with patients daughter concern for patiens AV fistula and worsening renal function. Conveyed this to on coming hospitalist Dr. Barbaraann Rondo who will be discussing with Dr.Webb Nephrologist. Disposition Plan: Home. Consults called: ER physician discussed with nephrologist. Admission status: Observation.   Rise Patience MD [facility] Pager [phone].  If 7PM-7AM, please contact night-coverage [email or web address] Password [facility]  01/21/2019, 10:58 PM

## 2019-01-22 ENCOUNTER — Observation Stay (HOSPITAL_COMMUNITY): Payer: Medicare Other

## 2019-01-22 ENCOUNTER — Other Ambulatory Visit: Payer: Self-pay

## 2019-01-22 DIAGNOSIS — R55 Syncope and collapse: Secondary | ICD-10-CM

## 2019-01-22 DIAGNOSIS — E1142 Type 2 diabetes mellitus with diabetic polyneuropathy: Secondary | ICD-10-CM | POA: Diagnosis present

## 2019-01-22 DIAGNOSIS — G8929 Other chronic pain: Secondary | ICD-10-CM

## 2019-01-22 DIAGNOSIS — E785 Hyperlipidemia, unspecified: Secondary | ICD-10-CM | POA: Diagnosis present

## 2019-01-22 DIAGNOSIS — E876 Hypokalemia: Secondary | ICD-10-CM | POA: Diagnosis present

## 2019-01-22 DIAGNOSIS — N185 Chronic kidney disease, stage 5: Secondary | ICD-10-CM | POA: Diagnosis not present

## 2019-01-22 DIAGNOSIS — D631 Anemia in chronic kidney disease: Secondary | ICD-10-CM | POA: Diagnosis present

## 2019-01-22 DIAGNOSIS — E1151 Type 2 diabetes mellitus with diabetic peripheral angiopathy without gangrene: Secondary | ICD-10-CM | POA: Diagnosis present

## 2019-01-22 DIAGNOSIS — Z841 Family history of disorders of kidney and ureter: Secondary | ICD-10-CM | POA: Diagnosis not present

## 2019-01-22 DIAGNOSIS — Z20828 Contact with and (suspected) exposure to other viral communicable diseases: Secondary | ICD-10-CM | POA: Diagnosis present

## 2019-01-22 DIAGNOSIS — N184 Chronic kidney disease, stage 4 (severe): Secondary | ICD-10-CM | POA: Diagnosis not present

## 2019-01-22 DIAGNOSIS — Z87891 Personal history of nicotine dependence: Secondary | ICD-10-CM | POA: Diagnosis not present

## 2019-01-22 DIAGNOSIS — E039 Hypothyroidism, unspecified: Secondary | ICD-10-CM | POA: Diagnosis present

## 2019-01-22 DIAGNOSIS — Z79890 Hormone replacement therapy (postmenopausal): Secondary | ICD-10-CM | POA: Diagnosis not present

## 2019-01-22 DIAGNOSIS — Z7982 Long term (current) use of aspirin: Secondary | ICD-10-CM | POA: Diagnosis not present

## 2019-01-22 DIAGNOSIS — M109 Gout, unspecified: Secondary | ICD-10-CM | POA: Diagnosis present

## 2019-01-22 DIAGNOSIS — Y712 Prosthetic and other implants, materials and accessory cardiovascular devices associated with adverse incidents: Secondary | ICD-10-CM | POA: Diagnosis not present

## 2019-01-22 DIAGNOSIS — E1122 Type 2 diabetes mellitus with diabetic chronic kidney disease: Secondary | ICD-10-CM | POA: Diagnosis present

## 2019-01-22 DIAGNOSIS — E11319 Type 2 diabetes mellitus with unspecified diabetic retinopathy without macular edema: Secondary | ICD-10-CM | POA: Diagnosis present

## 2019-01-22 DIAGNOSIS — T82510A Breakdown (mechanical) of surgically created arteriovenous fistula, initial encounter: Secondary | ICD-10-CM | POA: Diagnosis not present

## 2019-01-22 DIAGNOSIS — N186 End stage renal disease: Secondary | ICD-10-CM | POA: Diagnosis present

## 2019-01-22 DIAGNOSIS — R7989 Other specified abnormal findings of blood chemistry: Secondary | ICD-10-CM | POA: Diagnosis not present

## 2019-01-22 DIAGNOSIS — Z888 Allergy status to other drugs, medicaments and biological substances status: Secondary | ICD-10-CM | POA: Diagnosis not present

## 2019-01-22 DIAGNOSIS — T82868A Thrombosis of vascular prosthetic devices, implants and grafts, initial encounter: Secondary | ICD-10-CM | POA: Diagnosis not present

## 2019-01-22 DIAGNOSIS — E872 Acidosis: Secondary | ICD-10-CM | POA: Diagnosis present

## 2019-01-22 DIAGNOSIS — Z91048 Other nonmedicinal substance allergy status: Secondary | ICD-10-CM | POA: Diagnosis not present

## 2019-01-22 DIAGNOSIS — Z833 Family history of diabetes mellitus: Secondary | ICD-10-CM | POA: Diagnosis not present

## 2019-01-22 DIAGNOSIS — Z91041 Radiographic dye allergy status: Secondary | ICD-10-CM | POA: Diagnosis not present

## 2019-01-22 DIAGNOSIS — E119 Type 2 diabetes mellitus without complications: Secondary | ICD-10-CM | POA: Diagnosis not present

## 2019-01-22 DIAGNOSIS — N2581 Secondary hyperparathyroidism of renal origin: Secondary | ICD-10-CM | POA: Diagnosis present

## 2019-01-22 DIAGNOSIS — N179 Acute kidney failure, unspecified: Secondary | ICD-10-CM | POA: Diagnosis present

## 2019-01-22 LAB — COMPREHENSIVE METABOLIC PANEL
ALT: 27 U/L (ref 0–44)
AST: 48 U/L — ABNORMAL HIGH (ref 15–41)
Albumin: 3 g/dL — ABNORMAL LOW (ref 3.5–5.0)
Alkaline Phosphatase: 68 U/L (ref 38–126)
Anion gap: 18 — ABNORMAL HIGH (ref 5–15)
BUN: 123 mg/dL — ABNORMAL HIGH (ref 8–23)
CO2: 23 mmol/L (ref 22–32)
Calcium: 8.6 mg/dL — ABNORMAL LOW (ref 8.9–10.3)
Chloride: 103 mmol/L (ref 98–111)
Creatinine, Ser: 6.49 mg/dL — ABNORMAL HIGH (ref 0.61–1.24)
GFR calc Af Amer: 9 mL/min — ABNORMAL LOW (ref >60)
GFR calc non Af Amer: 8 mL/min — ABNORMAL LOW (ref >60)
Glucose, Bld: 142 mg/dL — ABNORMAL HIGH (ref 70–99)
Potassium: 3 mmol/L — ABNORMAL LOW (ref 3.5–5.1)
Sodium: 144 mmol/L (ref 135–145)
Total Bilirubin: 0.5 mg/dL (ref 0.3–1.2)
Total Protein: 6.4 g/dL — ABNORMAL LOW (ref 6.5–8.1)

## 2019-01-22 LAB — TROPONIN I
Troponin I: 0.09 ng/mL (ref <0.03)
Troponin I: 0.1 ng/mL (ref <0.03)
Troponin I: 0.09 ng/mL (ref <0.03)

## 2019-01-22 LAB — GLUCOSE, CAPILLARY
Glucose-Capillary: 137 mg/dL — ABNORMAL HIGH (ref 70–99)
Glucose-Capillary: 228 mg/dL — ABNORMAL HIGH (ref 70–99)
Glucose-Capillary: 126 mg/dL — ABNORMAL HIGH (ref 70–99)

## 2019-01-22 LAB — URINE CULTURE: Culture: NO GROWTH

## 2019-01-22 LAB — CBC
HCT: 34.1 % — ABNORMAL LOW (ref 39.0–52.0)
Hemoglobin: 11.6 g/dL — ABNORMAL LOW (ref 13.0–17.0)
MCH: 31.2 pg (ref 26.0–34.0)
MCHC: 34 g/dL (ref 30.0–36.0)
MCV: 91.7 fL (ref 80.0–100.0)
Platelets: 169 10*3/uL (ref 150–400)
RBC: 3.72 MIL/uL — ABNORMAL LOW (ref 4.22–5.81)
RDW: 13.6 % (ref 11.5–15.5)
WBC: 7.8 10*3/uL (ref 4.0–10.5)
nRBC: 0 % (ref 0.0–0.2)

## 2019-01-22 LAB — MAGNESIUM: Magnesium: 1.9 mg/dL (ref 1.7–2.4)

## 2019-01-22 LAB — CREATININE, SERUM
Creatinine, Ser: 6.48 mg/dL — ABNORMAL HIGH (ref 0.61–1.24)
GFR calc Af Amer: 9 mL/min — ABNORMAL LOW (ref >60)
GFR calc non Af Amer: 8 mL/min — ABNORMAL LOW (ref >60)

## 2019-01-22 LAB — HEPATITIS B SURFACE ANTIGEN: Hepatitis B Surface Ag: NEGATIVE

## 2019-01-22 LAB — ECHOCARDIOGRAM COMPLETE
Height: 68 in
Weight: 2979.2 oz

## 2019-01-22 MED ORDER — POTASSIUM CHLORIDE CRYS ER 20 MEQ PO TBCR: 60.0000 meq | EXTENDED_RELEASE_TABLET | Freq: Once | ORAL | Status: DC

## 2019-01-22 MED ORDER — CHLORHEXIDINE GLUCONATE CLOTH 2 % EX PADS
6.0000 | MEDICATED_PAD | Freq: Every day | CUTANEOUS | Status: DC
  Administered 2019-01-22 – 2019-01-28 (×5): 6 via TOPICAL

## 2019-01-22 MED ORDER — POTASSIUM CHLORIDE CRYS ER 20 MEQ PO TBCR
40.0000 meq | EXTENDED_RELEASE_TABLET | Freq: Once | ORAL | Status: AC
  Administered 2019-01-22: 40 meq via ORAL
  Filled 2019-01-22: qty 2

## 2019-01-22 MED ORDER — PERFLUTREN LIPID MICROSPHERE
1.0000 mL | INTRAVENOUS | Status: AC | PRN
  Administered 2019-01-22: 5 mL via INTRAVENOUS
  Filled 2019-01-22: qty 10

## 2019-01-22 MED FILL — Perflutren Lipid Microsphere IV Susp 1.1 MG/ML: INTRAVENOUS | Qty: 10 | Status: AC

## 2019-01-22 NOTE — Care Management Obs Status (Signed)
[facility] NOTIFICATION   Patient Details  Name: Frank [name] MRN: [id number] Date of Birth: August 13, 1941   Medicare Observation Status Notification Given:  Yes(Letter explained, pt refused to sign letter)    Royston Bake, RN 01/22/2019, 9:14 AM

## 2019-01-22 NOTE — Consult Note (Signed)
[facility]  INPATIENT CONSULTATION  Reason for Consultation: CKD 5 Requesting Provider: Dr. Ree Kida  HPI: Frank [name] is an 78 y.o. male with CKD 5, gout, HL, hypothyroidism, DM, AoCKD who is seen for evaluation and management of CKD.     Patient has had recent h/o nausea, some emesis and d/w nephrologist Dr. Justin Mend.  Labs were checked and Cr ~6 so outpt dialysis plans were being made for [facility].  In the meantime he started to feel more poorly and had near syncope yesterday. Patient reported to PCPs office yesterday.  Labs checked showed Cr ~ 6 by report and he was alerted to go to the ED.  Cr here 6.5, BUN ~120.  CXR fine, COVID negative.  Trop 0.07. trended x 4, now 0.1.  HB 12.0. Admitted for CP r/o, syncope w/u.  Nephrology is consulted for AKI on CKD and to discuss possible need for dialysis.   He provides little history but his daughter [name], RN is available by phone and gives the detailed history.  Increasing forgetfulness lately (acute on chronic), nausea, some dysgeusia.   2nd stage [facility] AVF completed by Dr. Donzetta Matters 06/04/18.  She concerned for abnormal physical exam.   PMH: Past Medical History:  Diagnosis Date  . Anemia of chronic kidney failure   . Bradycardia   . Chronic kidney disease    [facility]  . Diabetes mellitus without complication (HCC)    diet controlled   . Diabetic nephropathy ([place])   . Gout   . Gout   . Hyperlipidemia   . Hypothyroidism   . Neuropathy   . Peripheral neuropathy   . Peripheral vascular disease ([place])   . Secondary hyperparathyroidism ([place])    Renal  . Thyroid disease    PSH: Past Surgical History:  Procedure Laterality Date  . AV FISTULA PLACEMENT Left 11/19/2017   Procedure: ARTERIOVENOUS (AV) FISTULA CREATION;  Surgeon: [name], MD;  Location: [place];  Service: Vascular;  Laterality: Left;  . BASCILIC VEIN TRANSPOSITION Left 06/04/2018   Procedure: BASILIC VEIN TRANSPOSITION ARM;  Surgeon:  [name], MD;  Location: [place];  Service: Vascular;  Laterality: Left;  . COLONOSCOPY    . EYE SURGERY     cataract surgery  . KNEE ARTHROSCOPY Left    Knee    Past Medical History:  Diagnosis Date  . Anemia of chronic kidney failure   . Bradycardia   . Chronic kidney disease    [facility]  . Diabetes mellitus without complication (HCC)    diet controlled   . Diabetic nephropathy ([place])   . Gout   . Gout   . Hyperlipidemia   . Hypothyroidism   . Neuropathy   . Peripheral neuropathy   . Peripheral vascular disease ([place])   . Secondary hyperparathyroidism ([place])    Renal  . Thyroid disease     Medications:  I have reviewed the patient's current medications.  Medications Prior to Admission  Medication Sig Dispense Refill  . allopurinol (ZYLOPRIM) 100 MG tablet Take 100 mg by mouth daily.     Marland Kitchen aspirin EC 81 MG tablet Take 81 mg daily by mouth.    Marland Kitchen atorvastatin (LIPITOR) 20 MG tablet Take 20 mg daily by mouth.    . calcitRIOL (ROCALTROL) 0.25 MCG capsule Take 0.25 mcg by mouth daily.    . furosemide (LASIX) 20 MG tablet Take 20 mg by mouth daily.     Marland Kitchen gabapentin (NEURONTIN) 100 MG capsule  Take 300 mg by mouth daily at 3 pm.     . HYDROcodone-acetaminophen (NORCO) 10-325 MG tablet Take 0.5 tablets by mouth See admin instructions. Take 0.5 tablet by mouth up to 6 times a day as needed for pain    . levothyroxine (SYNTHROID) 150 MCG tablet Take 150 mcg by mouth daily before breakfast.    . multivitamin (RENA-VIT) TABS tablet Take 1 tablet daily by mouth.    . ondansetron (ZOFRAN) 4 MG tablet Take 4 mg by mouth every 8 (eight) hours as needed for nausea or vomiting.     . sodium bicarbonate 650 MG tablet Take 650 mg by mouth 2 (two) times daily.    . tamsulosin (FLOMAX) 0.4 MG CAPS capsule Take 0.4 mg by mouth daily.     . Zinc Oxide (BALMEX EX) Apply 1 application topically daily as needed (to irritated areas of skin).     Marland Kitchen HYDROcodone-acetaminophen  (NORCO) 5-325 MG tablet Take 1 tablet by mouth every 6 (six) hours as needed for moderate pain. 12 tablet 0  . oxyCODONE (OXY IR/ROXICODONE) 5 MG immediate release tablet Take 5 mg by mouth every 4 (four) hours as needed (for pain).       ALLERGIES:   Allergies  Allergen Reactions  . Ace Inhibitors Other (See Comments)    Hyperkalemia on ACE INHIBITORS Cr>1.9  . Adhesive [Tape] Other (See Comments)    Can tolerate ONLY Coban wrap or mild paper tape!!  . Angiotensin Receptor Blockers Other (See Comments)    Hyperkalemia   . Iodine-131 Other (See Comments)    Cannot have due to renal failure   . Ivp Dye [Iodinated Diagnostic Agents] Other (See Comments)    Cannot have due to renal failure  . Other Other (See Comments)    NO IV dyes due to renal failure     FAM HX: Family History  Problem Relation Age of Onset  . Diabetes Mother   . Kidney disease Mother   . Liver disease Mother   . Heart Problems Mother     Social History:   reports that he quit smoking about 26 years ago. He quit after 30.00 years of use. He has never used smokeless tobacco. He reports that he does not drink alcohol or use drugs.  ROS: 12 system ROS per HPI Above  Blood pressure 123/72, pulse (!) 56, temperature 98.5 F (36.9 C), temperature source Oral, resp. rate 18, height 5\' 8"  (1.727 m), weight 84.5 kg, SpO2 97 %. PHYSICAL EXAM: Gen: eating a full lunch  Eyes:  anicteric ENT: MMM Neck: supple, no JVD CV:  RRR, no rub Abd: thin, soft, nontender GU: no foley Extr: no edema, LUE AVF with thrill and bruit but pulsatile still with elevation of arm Neuro: difficulty with conversation - relies on daughter for details Skin: no rashes   Results for orders placed or performed during the hospital encounter of 01/21/19 (from the past 48 hour(s))  Basic metabolic panel     Status: Abnormal   Collection Time: 01/21/19  3:01 PM  Result Value Ref Range   Sodium 137 135 - 145 mmol/L   Potassium 3.2 (L)  3.5 - 5.1 mmol/L   Chloride 100 98 - 111 mmol/L   CO2 18 (L) 22 - 32 mmol/L   Glucose, Bld 155 (H) 70 - 99 mg/dL   BUN 124 (H) 8 - 23 mg/dL   Creatinine, Ser 6.52 (H) 0.61 - 1.24 mg/dL   Calcium 8.8 (L) 8.9 -  10.3 mg/dL   GFR calc non Af Amer 7 (L) >60 mL/min   GFR calc Af Amer 9 (L) >60 mL/min   Anion gap 19 (H) 5 - 15    Comment: Performed at [facility] [address], [place], [place] [address]  CBC     Status: Abnormal   Collection Time: 01/21/19  3:01 PM  Result Value Ref Range   WBC 10.0 4.0 - 10.5 K/uL   RBC 4.19 (L) 4.22 - 5.81 MIL/uL   Hemoglobin 13.0 13.0 - 17.0 g/dL   HCT 39.5 39.0 - 52.0 %   MCV 94.3 80.0 - 100.0 fL   MCH 31.0 26.0 - 34.0 pg   MCHC 32.9 30.0 - 36.0 g/dL   RDW 13.8 11.5 - 15.5 %   Platelets 198 150 - 400 K/uL   nRBC 0.0 0.0 - 0.2 %    Comment: Performed at [facility], [place] [address], [place], [place] [address]  Troponin I - Once     Status: Abnormal   Collection Time: 01/21/19  3:01 PM  Result Value Ref Range   Troponin I 0.07 (HH) <0.03 ng/mL    Comment: CRITICAL RESULT CALLED TO, READ BACK BY AND VERIFIED WITH: H.VANKRETSCHMAR RN [id number] 01/21/2019 MCCORMICK K Performed at [facility], [place] [address], [place], [place] [address]   CBG monitoring, ED     Status: Abnormal   Collection Time: 01/21/19  3:16 PM  Result Value Ref Range   Glucose-Capillary 143 (H) 70 - 99 mg/dL  Urinalysis, Routine w reflex microscopic     Status: Abnormal   Collection Time: 01/21/19  3:51 PM  Result Value Ref Range   Color, Urine YELLOW YELLOW   APPearance HAZY (A) CLEAR   Specific Gravity, Urine 1.013 1.005 - 1.030   pH 5.0 5.0 - 8.0   Glucose, UA 50 (A) NEGATIVE mg/dL   Hgb urine dipstick MODERATE (A) NEGATIVE   Bilirubin Urine NEGATIVE NEGATIVE   Ketones, ur 5 (A) NEGATIVE mg/dL   Protein, ur >=300 (A) NEGATIVE mg/dL   Nitrite NEGATIVE NEGATIVE   Leukocytes,Ua NEGATIVE NEGATIVE   RBC / HPF 0-5 0 - 5 RBC/hpf   WBC, UA 0-5 0 - 5 WBC/hpf    Bacteria, UA NONE SEEN NONE SEEN   Mucus PRESENT    Hyaline Casts, UA PRESENT     Comment: Performed at [facility], [address], [place], [place] [address]  SARS Coronavirus 2 [facility] order, Performed in [facility] hospital lab)     Status: None   Collection Time: 01/21/19  4:24 PM  Result Value Ref Range   SARS Coronavirus 2 NEGATIVE NEGATIVE    Comment: (NOTE) If result is NEGATIVE SARS-CoV-2 target nucleic acids are NOT DETECTED. The SARS-CoV-2 RNA is generally detectable in upper and lower  respiratory specimens during the acute phase of infection. The lowest  concentration of SARS-CoV-2 viral copies this assay can detect is 250  copies / mL. A negative result does not preclude SARS-CoV-2 infection  and should not be used as the sole basis for treatment or other  patient management decisions.  A negative result may occur with  improper specimen collection / handling, submission of specimen other  than nasopharyngeal swab, presence of viral mutation(s) within the  areas targeted by this assay, and inadequate number of viral copies  (<250 copies / mL). A negative result must be combined with clinical  observations, patient history, and epidemiological information.  If result is POSITIVE SARS-CoV-2 target nucleic acids are DETECTED. The SARS-CoV-2 RNA is generally detectable in upper and lower  respiratory specimens dur ing the acute phase of infection.  Positive  results are indicative of active infection with SARS-CoV-2.  Clinical  correlation with patient history and other diagnostic information is  necessary to determine patient infection status.  Positive results do  not rule out bacterial infection or co-infection with other viruses. If result is PRESUMPTIVE POSTIVE SARS-CoV-2 nucleic acids MAY BE PRESENT.   A presumptive positive result was obtained on the submitted specimen  and confirmed on repeat testing.  While 2019 novel coronavirus  (SARS-CoV-2)  nucleic acids may be present in the submitted sample  additional confirmatory testing may be necessary for epidemiological  and / or clinical management purposes  to differentiate between  SARS-CoV-2 and other Sarbecovirus currently known to infect humans.  If clinically indicated additional testing with an alternate test  methodology [phone]) is advised. The SARS-CoV-2 RNA is generally  detectable in upper and lower respiratory sp ecimens during the acute  phase of infection. The expected result is Negative. Fact Sheet for Patients:  StrictlyIdeas.no Fact Sheet for Healthcare Providers: BankingDealers.co.za This test is not yet approved or cleared by the [place] FDA and has been authorized for detection and/or diagnosis of SARS-CoV-2 by FDA under an Emergency Use Authorization (EUA).  This EUA will remain in effect (meaning this test can be used) for the duration of the COVID-19 declaration under Section 564(b)(1) of the Act, 21 U.S.C. section 360bbb-3(b)(1), unless the authorization is terminated or revoked sooner. Performed at [facility], [place] [address] [place], [place] [address]   Troponin I - ONCE - STAT     Status: Abnormal   Collection Time: 01/21/19  6:41 PM  Result Value Ref Range   Troponin I 0.08 (HH) <0.03 ng/mL    Comment: CRITICAL VALUE NOTED.  VALUE IS CONSISTENT WITH PREVIOUSLY REPORTED AND CALLED VALUE. Performed at [facility], [place] [address], [place], [place] [address]   Glucose, capillary     Status: Abnormal   Collection Time: 01/21/19  9:25 PM  Result Value Ref Range   Glucose-Capillary 100 (H) 70 - 99 mg/dL   Comment 1 Notify RN    Comment 2 Document in Chart   CBC     Status: Abnormal   Collection Time: 01/21/19 11:04 PM  Result Value Ref Range   WBC 8.9 4.0 - 10.5 K/uL   RBC 3.86 (L) 4.22 - 5.81 MIL/uL   Hemoglobin 12.0 (L) 13.0 - 17.0 g/dL   HCT 35.7 (L) 39.0 - 52.0 %   MCV 92.5  80.0 - 100.0 fL   MCH 31.1 26.0 - 34.0 pg   MCHC 33.6 30.0 - 36.0 g/dL   RDW 13.7 11.5 - 15.5 %   Platelets 175 150 - 400 K/uL   nRBC 0.0 0.0 - 0.2 %    Comment: Performed at [facility], [place] [address], [place], [place] [address]  Creatinine, serum     Status: Abnormal   Collection Time: 01/21/19 11:04 PM  Result Value Ref Range   Creatinine, Ser 6.48 (H) 0.61 - 1.24 mg/dL   GFR calc non Af Amer 8 (L) >60 mL/min   GFR calc Af Amer 9 (L) >60 mL/min    Comment: Performed at [facility] [address], [place], [place] [address]  Troponin I - Now Then Q6H     Status:  Abnormal   Collection Time: 01/21/19 11:04 PM  Result Value Ref Range   Troponin I 0.09 (HH) <0.03 ng/mL    Comment: CRITICAL VALUE NOTED.  VALUE IS CONSISTENT WITH PREVIOUSLY REPORTED AND CALLED VALUE. Performed at [facility], [place] [address], [place], [place] [address]   CBC     Status: Abnormal   Collection Time: 01/22/19  5:41 AM  Result Value Ref Range   WBC 7.8 4.0 - 10.5 K/uL   RBC 3.72 (L) 4.22 - 5.81 MIL/uL   Hemoglobin 11.6 (L) 13.0 - 17.0 g/dL   HCT 34.1 (L) 39.0 - 52.0 %   MCV 91.7 80.0 - 100.0 fL   MCH 31.2 26.0 - 34.0 pg   MCHC 34.0 30.0 - 36.0 g/dL   RDW 13.6 11.5 - 15.5 %   Platelets 169 150 - 400 K/uL   nRBC 0.0 0.0 - 0.2 %    Comment: Performed at [facility], [place] [address] [place], [place] [address]  Comprehensive metabolic panel     Status: Abnormal   Collection Time: 01/22/19  5:41 AM  Result Value Ref Range   Sodium 144 135 - 145 mmol/L   Potassium 3.0 (L) 3.5 - 5.1 mmol/L   Chloride 103 98 - 111 mmol/L   CO2 23 22 - 32 mmol/L   Glucose, Bld 142 (H) 70 - 99 mg/dL   BUN 123 (H) 8 - 23 mg/dL   Creatinine, Ser 6.49 (H) 0.61 - 1.24 mg/dL   Calcium 8.6 (L) 8.9 - 10.3 mg/dL   Total Protein 6.4 (L) 6.5 - 8.1 g/dL   Albumin 3.0 (L) 3.5 - 5.0 g/dL   AST 48 (H) 15 - 41 U/L   ALT 27 0 - 44 U/L   Alkaline Phosphatase 68 38 - 126 U/L   Total Bilirubin 0.5 0.3 - 1.2  mg/dL   GFR calc non Af Amer 8 (L) >60 mL/min   GFR calc Af Amer 9 (L) >60 mL/min   Anion gap 18 (H) 5 - 15    Comment: Performed at [facility], [place] [address] [place], [place] [address]  Troponin I - Now Then Q6H     Status: Abnormal   Collection Time: 01/22/19  5:41 AM  Result Value Ref Range   Troponin I 0.10 (HH) <0.03 ng/mL    Comment: CRITICAL VALUE NOTED.  VALUE IS CONSISTENT WITH PREVIOUSLY REPORTED AND CALLED VALUE. Performed at [facility], [place] [address], [place], [place] [address]   Glucose, capillary     Status: Abnormal   Collection Time: 01/22/19  5:59 AM  Result Value Ref Range   Glucose-Capillary 137 (H) 70 - 99 mg/dL   Comment 1 Notify RN    Comment 2 Document in Chart     Dg Chest Port 1 View  Result Date: 01/21/2019 CLINICAL DATA:  Generalized weakness EXAM: PORTABLE CHEST 1 VIEW COMPARISON:  None. FINDINGS: Elevated right diaphragm with mild right lower lobe atelectasis. Lungs otherwise clear. Negative for heart failure. IMPRESSION: Mild right lower lobe atelectasis. Electronically Signed   By: Franchot Gallo M.D.   On: 01/21/2019 17:36    Assessment/Plan **CKD 5 now with uremic symptoms: initiate dialysis - 1st treatment today, 2nd tomorrow.  4K/2.5Ca, no heparin, no UF; BFR 250.  AVF with thrill and bruit but feels like outflow stenosis present.  Will see how it does with HD but I suspect it was need vascular evaluation.  CLIP underway, I  let inpt coordinator know he's here now.    **BMM: on calcitriol 0.25 daily.  Cont.  Ca ok. Check phos. Not on binder.   **metabolic acidosis: cont outpt dose na bicarb for now, but once dialysis full treatment will stop.   **Anemia of CKD: mild, no indications for therapy at this time, monitor.   **near syncope:  TTE pending, trop cycled ~negative, no CP. On tele. Appears well currently.   **DM: on insulin, per primary  **hypothyroidism: on home synthroid.   Justin Mend 01/22/2019, 11:24 AM

## 2019-01-22 NOTE — Progress Notes (Addendum)
Pt clotted 15 min after first HD rx initiated.  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   Pt made NPO p MN Will alert [facility] for possible fistulogram.    Madelon Lips MD [place] pgr [phone]

## 2019-01-22 NOTE — Progress Notes (Signed)
Renal Navigator received call from Dr. Evie Lacks to initiate OP HD referral and notes that a referral has been started from the office to [facility]. Renal Navigator checked on status of referral. Currently, OP HD center is only awaiting HepB labs to finish processing referral. MD notified as currently patient's HepB lab order status is marked "held." Renal Navigator will notify [facility] Admissions Coordinator once labs have resulted and follow up on referral until patient has been accepted and a seat schedule has been obtained.  Alphonzo Cruise Renal Navigator [phone]

## 2019-01-22 NOTE — Progress Notes (Signed)
  Echocardiogram 2D Echocardiogram with Definity has been performed.  Frank [name] 01/22/2019, 10:09 AM

## 2019-01-22 NOTE — Progress Notes (Addendum)
PROGRESS NOTE    Frank [name]  ZTI:[id number] DOB: September 02, 1941 DOA: 01/21/2019 PCP: Chester Holstein, MD   Brief Narrative:   Assessment & Plan   Near Syncope -Patient feels he was dehydrated-as he states he had vomiting prior to admission -Denies any current dizziness -COVID negative -Orthostatic vitals positive drop from supine to standing -Echocardiogram pending  -PT consulted no further therapy needed.  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 Justin Mend, nephrology -Per Care everywhere, in January creatinine was 4.82 -Creatinine currently 6.49 -Patient is able to produce urine but states it has painful at times and dark in color -had had forgetfulness as well as is her nausea and dysphagia -Nephrology consulted and appreciated. Discussed with Dr. Johnney Ou- patient will start dialysis today, have HD session on 5/15, and one on 5/16.  -patient does have a LUE AVF in place, but possibly not functional- vascular surgery consulted and appreciated  Hypokalemia -Replace and monitor   Hypothyroidism -Continue Synthroid  Diabetes mellitus, type II -Appears to be diet controlled as patient is not on any home medications -Last hemoglobin A1c was 5.9 on 06/04/2018  Chronic pain -Continue home regimen  BPH -Continue Flomax  DVT Prophylaxis  heparin  Code Status: Full  Family Communication: None at bedside. Daughter via phone.  Disposition Plan: Admitted. Pending further recommendations from nephrology. Suspect home upon discharge.   Consultants Nephrology Vascular surgery  Procedures  Echocardiogram  Antibiotics   Anti-infectives (From admission, onward)   None      Subjective:   Frank [name] seen and examined today.   Ready to go home. Has no complaints today. Denies chest pain, shortness of breath, abdominal pain, nausea, vomiting, diarrhea, dizziness, headache.   Objective:   Vitals:   01/21/19 2114 01/21/19 2116 01/22/19 0429 01/22/19 1206  BP: (!) 166/102 (!) 155/88 123/72 130/65  Pulse: 90 90 (!) 56 81  Resp: 16 16 18 18   Temp: 98.2 F (36.8 C) 98.2 F (36.8 C) 98.5 F (36.9 C) 98.4 F (36.9 C)  TempSrc: Oral Oral Oral Oral  SpO2: 100% 100% 97% 100%  Weight:  85.6 kg 84.5 kg   Height:  5\' 8"  (1.727 m)      Intake/Output Summary (Last 24 hours) at 01/22/2019 1223 Last data filed at 01/22/2019 8250 Gross per 24 hour  Intake 770 ml  Output 750 ml  Net 20 ml   Filed Weights   01/21/19 2116 01/22/19 0429  Weight: 85.6 kg 84.5 kg    Exam  General: Well developed, well nourished, NAD, appears stated age  HEENT: NCAT, mucous membranes moist.   Neck: Supple  Cardiovascular: S1 S2 auscultated, RRR  Respiratory: Clear to auscultation bilaterally   Abdomen: Soft, nontender, nondistended, + bowel sounds  Extremities: warm dry without cyanosis clubbing or edema. LUE AFV with thrill  Neuro: AAOx3, nonfocal  Psych: Normal affect and demeanor    Data Reviewed: I have personally reviewed following labs and imaging studies  CBC: Recent Labs  Lab 01/21/19 1501 01/21/19 2304 01/22/19 0541  WBC 10.0 8.9 7.8  HGB 13.0 12.0* 11.6*  HCT 39.5 35.7* 34.1*  MCV 94.3 92.5 91.7  PLT 198 175 539   Basic Metabolic Panel: Recent Labs  Lab 01/21/19 1501 01/21/19 2304 01/22/19 0541 01/22/19 1044  NA 137  --  144  --   K 3.2*  --  3.0*  --   CL 100  --  103  --   CO2 18*  --  23  --   GLUCOSE 155*  --  142*  --   BUN 124*  --  123*  --   CREATININE 6.52* 6.48* 6.49*  --   CALCIUM 8.8*  --  8.6*  --   MG  --   --   --  1.9   GFR: Estimated Creatinine Clearance: 10.1 mL/min (A) (by C-G formula based on SCr of 6.49 mg/dL (H)). Liver Function Tests: Recent Labs  Lab 01/22/19  0541  AST 48*  ALT 27  ALKPHOS 68  BILITOT 0.5  PROT 6.4*  ALBUMIN 3.0*   No results for input(s): LIPASE, AMYLASE in the last 168 hours. No results for input(s): AMMONIA in the last 168 hours. Coagulation Profile: No results for input(s): INR, PROTIME in the last 168 hours. Cardiac Enzymes: Recent Labs  Lab 01/21/19 1501 01/21/19 1841 01/21/19 2304 01/22/19 0541 01/22/19 1044  TROPONINI 0.07* 0.08* 0.09* 0.10* 0.09*   BNP (last 3 results) No results for input(s): PROBNP in the last 8760 hours. HbA1C: No results for input(s): HGBA1C in the last 72 hours. CBG: Recent Labs  Lab 01/21/19 1516 01/21/19 2125 01/22/19 0559 01/22/19 1202  GLUCAP 143* 100* 137* 228*   Lipid Profile: No results for input(s): CHOL, HDL, LDLCALC, TRIG, CHOLHDL, LDLDIRECT in the last 72 hours. Thyroid Function Tests: No results for input(s): TSH, T4TOTAL, FREET4, T3FREE, THYROIDAB in the last 72 hours. Anemia Panel: No results for input(s): VITAMINB12, FOLATE, FERRITIN, TIBC, IRON, RETICCTPCT in the last 72 hours. Urine analysis:    Component Value Date/Time   COLORURINE YELLOW 01/21/2019 1551   APPEARANCEUR HAZY (A) 01/21/2019 1551   LABSPEC 1.013 01/21/2019 1551   PHURINE 5.0 01/21/2019 1551   GLUCOSEU 50 (A) 01/21/2019 1551   HGBUR MODERATE (A) 01/21/2019 1551   BILIRUBINUR NEGATIVE 01/21/2019 1551   KETONESUR 5 (A) 01/21/2019 1551   PROTEINUR >=300 (A) 01/21/2019 1551   NITRITE NEGATIVE 01/21/2019 1551   LEUKOCYTESUR NEGATIVE 01/21/2019 1551   Sepsis Labs: @LABRCNTIP (procalcitonin:4,lacticidven:4)  ) Recent Results (from the past 240 hour(s))  SARS Coronavirus 2 [facility] order, Performed in [place] hospital lab)     Status: None   Collection Time: 01/21/19  4:24 PM  Result Value Ref Range Status   SARS Coronavirus 2 NEGATIVE NEGATIVE Final    Comment: (NOTE) If result is NEGATIVE SARS-CoV-2 target nucleic acids are NOT DETECTED. The SARS-CoV-2 RNA is generally  detectable in upper and lower  respiratory specimens during the acute phase of infection. The lowest  concentration of SARS-CoV-2 viral copies this assay can detect is 250  copies / mL. A negative result does not preclude SARS-CoV-2 infection  and should not be used as the sole basis for treatment or other  patient management decisions.  A negative result may occur with  improper specimen collection / handling, submission of specimen other  than nasopharyngeal swab, presence of viral mutation(s) within the  areas targeted by this assay, and inadequate number of viral copies  (<250 copies / mL). A negative result must be combined with clinical  observations, patient history, and epidemiological information. If result is POSITIVE SARS-CoV-2 target nucleic acids are DETECTED. The SARS-CoV-2 RNA is generally detectable in upper and lower  respiratory specimens dur ing the acute phase of infection.  Positive  results are indicative of active infection  with SARS-CoV-2.  Clinical  correlation with patient history and other diagnostic information is  necessary to determine patient infection status.  Positive results do  not rule out bacterial infection or co-infection with other viruses. If result is PRESUMPTIVE POSTIVE SARS-CoV-2 nucleic acids MAY BE PRESENT.   A presumptive positive result was obtained on the submitted specimen  and confirmed on repeat testing.  While 2019 novel coronavirus  (SARS-CoV-2) nucleic acids may be present in the submitted sample  additional confirmatory testing may be necessary for epidemiological  and / or clinical management purposes  to differentiate between  SARS-CoV-2 and other Sarbecovirus currently known to infect humans.  If clinically indicated additional testing with an alternate test  methodology [phone]) is advised. The SARS-CoV-2 RNA is generally  detectable in upper and lower respiratory sp ecimens during the acute  phase of infection. The  expected result is Negative. Fact Sheet for Patients:  StrictlyIdeas.no Fact Sheet for Healthcare Providers: BankingDealers.co.za This test is not yet approved or cleared by the [place] FDA and has been authorized for detection and/or diagnosis of SARS-CoV-2 by FDA under an Emergency Use Authorization (EUA).  This EUA will remain in effect (meaning this test can be used) for the duration of the COVID-19 declaration under Section 564(b)(1) of the Act, 21 U.S.C. section 360bbb-3(b)(1), unless the authorization is terminated or revoked sooner. Performed at [facility], [place] [address], [place], [place] [address]       Radiology Studies: Dg [facility] 1 View  Result Date: 01/21/2019 CLINICAL DATA:  Generalized weakness EXAM: PORTABLE CHEST 1 VIEW COMPARISON:  None. FINDINGS: Elevated right diaphragm with mild right lower lobe atelectasis. Lungs otherwise clear. Negative for heart failure. IMPRESSION: Mild right lower lobe atelectasis. Electronically Signed   By: Franchot Gallo M.D.   On: 01/21/2019 17:36     Scheduled Meds: . allopurinol  100 mg Oral Daily  . aspirin EC  81 mg Oral Daily  . atorvastatin  20 mg Oral Daily  . calcitRIOL  0.25 mcg Oral Daily  . Chlorhexidine Gluconate Cloth  6 each Topical Q0600  . gabapentin  300 mg Oral Q1500  . heparin  5,000 Units Subcutaneous Q8H  . insulin aspart  0-9 Units Subcutaneous TID WC  . levothyroxine  150 mcg Oral Q0600  . multivitamin  1 tablet Oral Daily  . sodium bicarbonate  650 mg Oral BID  . tamsulosin  0.4 mg Oral Daily   Continuous Infusions:   LOS: 0 days   Time Spent in minutes   45 minutes (greater than 50% of time spent with patient face to face, as well as reviewing old records, calling consults, and formulating a plan)   Kacper Cartlidge D.O. on 01/22/2019 at 12:23 PM  Between 7am to 7pm - Please see pager noted on [email or web address]  After 7pm go to [email or web address]   And look for the night coverage person covering for me after hours  [facility] Office  [phone]

## 2019-01-22 NOTE — Progress Notes (Signed)
Pt.'s arterial site clotted around 25mins into HD tx. Attempted to recannulate by another RN but was pulling clot. Nephrologist was notified and ordered to send the pt back to room. Pt is not in any acute distress.

## 2019-01-22 NOTE — Evaluation (Signed)
Physical Therapy Evaluation & Discharge Patient Details Name: Frank [name] MRN: [id number] DOB: Sep 08, 1941 Today's Date: 01/22/2019   History of Present Illness  Pt is a 78 y.o. male admitted 01/21/19 with nausea and near-syncopal episodes; found to have elevated creatinine. PMH includes CKD V, DM2, chronic pain, neuropathy, PVD, gout.    Clinical Impression  Patient evaluated by Physical Therapy with no further acute PT needs identified.PTA, pt indep and lives alone; supportive children nearby. Today, pt indep with mobility; asymptomatic throughout session (see orthostatic vitals below). Educ re: energy conservation and fall risk reduction.  All education has been completed and the patient has no further questions. Acute PT is signing off. Thank you for this referral.  Orthostatic BPs Supine 133/69  Sitting 118/64  Standing 104/56  Standing after 2 min 109/54  Post-ambulation 132/72     Follow Up Recommendations No PT follow up    Equipment Recommendations  None recommended by PT    Recommendations for Other Services       Precautions / Restrictions Precautions Precautions: None Restrictions Weight Bearing Restrictions: No      Mobility  Bed Mobility Overal bed mobility: Independent                Transfers Overall transfer level: Independent                  Ambulation/Gait Ambulation/Gait assistance: Independent Gait Distance (Feet): 100 Feet Assistive device: None Gait Pattern/deviations: Step-through pattern;Decreased stride length Gait velocity: Decreased Gait velocity interpretation: 1.31 - 2.62 ft/sec, indicative of limited community ambulator General Gait Details: Slow, guarded gait without DME; pt with good ability to self-monitor activity tolerance. SpO2 98% on RA  Stairs            Wheelchair Mobility    Modified Rankin (Stroke Patients Only)       Balance Overall balance assessment: No apparent balance deficits (not  formally assessed)                                           Pertinent Vitals/Pain Pain Assessment: No/denies pain    Home Living Family/patient expects to be discharged to:: Private residence Living Arrangements: Alone Available Help at Discharge: Family;Available PRN/intermittently Type of Home: Mobile home Home Access: Stairs to enter Entrance Stairs-Rails: Psychiatric nurse of Steps: 3 Home Layout: One level Home Equipment: Cane - single point Additional Comments: Multiple, supportive children nearby; one is RN at [employer]    Prior Function Level of Independence: Independent with assistive device(s)         Comments: Indep with ambulation; uses SPC "if I walk more than 5 car lengths"     Hand Dominance        Extremity/Trunk Assessment   Upper Extremity Assessment Upper Extremity Assessment: Overall WFL for tasks assessed    Lower Extremity Assessment Lower Extremity Assessment: Overall WFL for tasks assessed       Communication   Communication: No difficulties  Cognition Arousal/Alertness: Awake/alert Behavior During Therapy: WFL for tasks assessed/performed Overall Cognitive Status: No family/caregiver present to determine baseline cognitive functioning                                 General Comments: Very tangential with speech, poor attention, but able to be redirected. Otherwise WFL for simple tasks  General Comments      Exercises     Assessment/Plan    PT Assessment Patent does not need any further PT services  PT Problem List         PT Treatment Interventions      PT Goals (Current goals can be found in the Care Plan section)  Acute Rehab PT Goals PT Goal Formulation: All assessment and education complete, DC therapy    Frequency     Barriers to discharge        Co-evaluation               AM-PAC PT "6 Clicks" Mobility  Outcome Measure Help needed turning from your back  to your side while in a flat bed without using bedrails?: None Help needed moving from lying on your back to sitting on the side of a flat bed without using bedrails?: None Help needed moving to and from a bed to a chair (including a wheelchair)?: None Help needed standing up from a chair using your arms (e.g., wheelchair or bedside chair)?: None Help needed to walk in hospital room?: None Help needed climbing 3-5 steps with a railing? : None 6 Click Score: 24    End of Session   Activity Tolerance: Patient tolerated treatment well Patient left: in chair;with call bell/phone within reach Nurse Communication: Mobility status PT Visit Diagnosis: Other abnormalities of gait and mobility (R26.89)    Time: 0383-3383 PT Time Calculation (min) (ACUTE ONLY): 20 min   Charges:   PT Evaluation $PT Eval Moderate Complexity: [place], PT, DPT Acute Rehabilitation Services  Pager [phone] Office [place] 01/22/2019, 9:45 AM

## 2019-01-22 NOTE — TOC Initial Note (Signed)
Transition of Care [facility]) - Initial/Assessment Note    Patient Details  Name: Frank [name] MRN: [id number] Date of Birth: 29-Mar-1941  Transition of Care [facility]) CM/SW Contact:    Sherrilyn Rist Phone Number: [phone] 01/22/2019, 9:11 AM  Clinical Narrative:                 Patient lives at home alone, continues to drive to his apts in [place], supportive daughters that assist him as needed; has private insurance with Medicare / Medicaid with prescription drug coverage; pharmacy of choice is [facility]. DME - cane prn. No needs identified at this time. CM will continue to follow for progression of care.  Expected Discharge Plan: Home/Self Care Barriers to Discharge: No Barriers Identified   Patient Goals and CMS Choice Patient states their goals for this hospitalization and ongoing recovery are:: to go back home and stay there CMS Medicare.gov Compare Post Acute Care list provided to:: Patient Choice offered to / list presented to : NA  Expected Discharge Plan and Services Expected Discharge Plan: Home/Self Care In-house Referral: NA Discharge Planning Services: CM Consult Post Acute Care Choice: NA Living arrangements for the past 2 months: Single Family Home                 DME Arranged: N/A DME Agency: NA       HH Arranged: NA HH Agency: NA        Prior Living Arrangements/Services Living arrangements for the past 2 months: Single Family Home Lives with:: Self Patient language and need for interpreter reviewed:: Yes Do you feel safe going back to the place where you live?: Yes      Need for Family Participation in Patient Care: No (Comment) Care giver support system in place?: Yes (comment)(supportive daughters)   Criminal Activity/Legal Involvement Pertinent to Current Situation/Hospitalization: No - Comment as needed  Activities of Daily Living Home Assistive Devices/Equipment: Cane (specify quad or straight) ADL Screening (condition at  time of admission) Patient's cognitive ability adequate to safely complete daily activities?: Yes Is the patient deaf or have difficulty hearing?: No Does the patient have difficulty seeing, even when wearing glasses/contacts?: Yes Does the patient have difficulty concentrating, remembering, or making decisions?: No Patient able to express need for assistance with ADLs?: Yes Does the patient have difficulty dressing or bathing?: No Independently performs ADLs?: Yes (appropriate for developmental age) Does the patient have difficulty walking or climbing stairs?: No Weakness of Legs: None Weakness of Arms/Hands: None  Permission Sought/Granted Permission sought to share information with : Case Manager                Emotional Assessment Appearance:: Developmentally appropriate Attitude/Demeanor/Rapport: Gracious, Engaged Affect (typically observed): Accepting Orientation: : Oriented to Self, Oriented to  Time, Oriented to Place, Oriented to Situation Alcohol / Substance Use: Not Applicable Psych Involvement: No (comment)  Admission diagnosis:  Elevated serum creatinine [R79.89] Elevated troponin [R79.89] Generalized weakness [R53.1] Patient Active Problem List   Diagnosis Date Noted  . Near syncope 01/21/2019  . Elevated troponin 01/21/2019  . Itching 10/18/2016  . Osteoarthritis of midfoot, right 08/13/2016  . Foot swelling 07/04/2016  . DM type 2 with diabetic peripheral neuropathy ([place]) 11/28/2015  . Pre-transplant evaluation for kidney transplant 11/28/2015  . HLD (hyperlipidemia) 08/01/2015  . 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 Chronic idiopathic gout of multiple sites 02/10/2015  . Illiterate 11/19/2013  . Chronic, continuous use of opioids 09/17/2013  .  CKD (chronic kidney disease), stage IV ([place]) 05/07/2013  . Essential hypertension 05/07/2013  . Osteoarthritis 05/07/2013  . Diastasis recti 10/29/2012   . 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 ED (erectile dysfunction) 08/28/2012  . Cataracts, bilateral 05/15/2012  . Chronic pain 05/15/2012  . CRF (chronic renal failure) 05/15/2012  . DM (diabetes mellitus) ([place]) 05/15/2012  . Gout of knee 05/15/2012  . Hyperkalemia 05/15/2012  . Hypothyroidism 05/15/2012   PCP:  Chester Holstein, MD Pharmacy:   [facility] [address] [place]) [place] [address] Phone: [phone] Fax: [phone]  [id number] [place], [address] [place] [address] Phone: [phone] Fax: [phone]     Social Determinants of Health (SDOH) Interventions    Readmission Risk Interventions No flowsheet data found.

## 2019-01-23 ENCOUNTER — Encounter (HOSPITAL_COMMUNITY): Payer: Self-pay | Admitting: Certified Registered"

## 2019-01-23 ENCOUNTER — Inpatient Hospital Stay (HOSPITAL_COMMUNITY): Payer: Medicare Other | Admitting: Anesthesiology

## 2019-01-23 ENCOUNTER — Inpatient Hospital Stay (HOSPITAL_COMMUNITY): Payer: Medicare Other

## 2019-01-23 ENCOUNTER — Encounter (HOSPITAL_COMMUNITY)
Admission: EM | Disposition: A | Discharge status: Home / Self Care | Payer: Self-pay | Source: Home / Self Care | Attending: Internal Medicine

## 2019-01-23 DIAGNOSIS — N185 Chronic kidney disease, stage 5: Secondary | ICD-10-CM

## 2019-01-23 DIAGNOSIS — T82868A Thrombosis of vascular prosthetic devices, implants and grafts, initial encounter: Secondary | ICD-10-CM

## 2019-01-23 DIAGNOSIS — E119 Type 2 diabetes mellitus without complications: Secondary | ICD-10-CM

## 2019-01-23 HISTORY — PX: INSERTION OF DIALYSIS CATHETER: SHX1324

## 2019-01-23 LAB — CBC
HCT: 32.8 % — ABNORMAL LOW (ref 39.0–52.0)
Hemoglobin: 11 g/dL — ABNORMAL LOW (ref 13.0–17.0)
MCH: 31.3 pg (ref 26.0–34.0)
MCHC: 33.5 g/dL (ref 30.0–36.0)
MCV: 93.2 fL (ref 80.0–100.0)
Platelets: 168 10*3/uL (ref 150–400)
RBC: 3.52 MIL/uL — ABNORMAL LOW (ref 4.22–5.81)
RDW: 14 % (ref 11.5–15.5)
WBC: 9.7 10*3/uL (ref 4.0–10.5)
nRBC: 0 % (ref 0.0–0.2)

## 2019-01-23 LAB — GLUCOSE, CAPILLARY
Glucose-Capillary: 127 mg/dL — ABNORMAL HIGH (ref 70–99)
Glucose-Capillary: 140 mg/dL — ABNORMAL HIGH (ref 70–99)
Glucose-Capillary: 141 mg/dL — ABNORMAL HIGH (ref 70–99)
Glucose-Capillary: 145 mg/dL — ABNORMAL HIGH (ref 70–99)
Glucose-Capillary: 180 mg/dL — ABNORMAL HIGH (ref 70–99)
Glucose-Capillary: 282 mg/dL — ABNORMAL HIGH (ref 70–99)

## 2019-01-23 LAB — HEPATITIS B SURFACE ANTIBODY,QUALITATIVE: Hep B S Ab: NONREACTIVE

## 2019-01-23 LAB — HEPATITIS B CORE ANTIBODY, TOTAL: Hep B Core Total Ab: NEGATIVE

## 2019-01-23 LAB — RENAL FUNCTION PANEL
Albumin: 2.8 g/dL — ABNORMAL LOW (ref 3.5–5.0)
Anion gap: 13 (ref 5–15)
BUN: 114 mg/dL — ABNORMAL HIGH (ref 8–23)
CO2: 24 mmol/L (ref 22–32)
Calcium: 8 mg/dL — ABNORMAL LOW (ref 8.9–10.3)
Chloride: 104 mmol/L (ref 98–111)
Creatinine, Ser: 6.25 mg/dL — ABNORMAL HIGH (ref 0.61–1.24)
GFR calc Af Amer: 9 mL/min — ABNORMAL LOW (ref >60)
GFR calc non Af Amer: 8 mL/min — ABNORMAL LOW (ref >60)
Glucose, Bld: 192 mg/dL — ABNORMAL HIGH (ref 70–99)
Phosphorus: 5 mg/dL — ABNORMAL HIGH (ref 2.5–4.6)
Potassium: 3.1 mmol/L — ABNORMAL LOW (ref 3.5–5.1)
Sodium: 141 mmol/L (ref 135–145)

## 2019-01-23 SURGERY — INSERTION OF DIALYSIS CATHETER
Anesthesia: General

## 2019-01-23 MED ORDER — HEPARIN SODIUM (PORCINE) 1000 UNIT/ML DIALYSIS: 1000.0000 [IU] | INTRAMUSCULAR | Status: DC | PRN

## 2019-01-23 MED ORDER — HEPARIN SODIUM (PORCINE) 1000 UNIT/ML IJ SOLN
INTRAMUSCULAR | Status: DC | PRN
  Administered 2019-01-23: 3400 [IU]
  Administered 2019-01-23: 3400 [IU] via INTRAVENOUS
  Administered 2019-01-24: 3400 [IU]

## 2019-01-23 MED ORDER — SODIUM CHLORIDE 0.9 % IV SOLN
INTRAVENOUS | Status: AC
  Filled 2019-01-23: qty 1.2

## 2019-01-23 MED ORDER — SODIUM CHLORIDE 0.9 % IV SOLN
INTRAVENOUS | Status: DC
  Administered 2019-01-23: 12:00:00 via INTRAVENOUS

## 2019-01-23 MED ORDER — CEFAZOLIN SODIUM 1 G IJ SOLR
INTRAMUSCULAR | Status: AC
  Filled 2019-01-23: qty 20

## 2019-01-23 MED ORDER — CEFAZOLIN SODIUM-DEXTROSE 2-3 GM-%(50ML) IV SOLR
INTRAVENOUS | Status: DC | PRN
  Administered 2019-01-23: 2 g via INTRAVENOUS

## 2019-01-23 MED ORDER — FENTANYL CITRATE (PF) 250 MCG/5ML IJ SOLN
INTRAMUSCULAR | Status: AC
  Filled 2019-01-23: qty 5

## 2019-01-23 MED ORDER — HEPARIN SODIUM (PORCINE) 1000 UNIT/ML IJ SOLN
INTRAMUSCULAR | Status: AC
  Filled 2019-01-23: qty 1

## 2019-01-23 MED ORDER — ONDANSETRON HCL 4 MG/2ML IJ SOLN
INTRAMUSCULAR | Status: DC | PRN
  Administered 2019-01-23: 4 mg via INTRAVENOUS

## 2019-01-23 MED ORDER — 0.9 % SODIUM CHLORIDE (POUR BTL) OPTIME
TOPICAL | Status: DC | PRN
  Administered 2019-01-23: 14:00:00 1000 mL

## 2019-01-23 MED ORDER — FENTANYL CITRATE (PF) 100 MCG/2ML IJ SOLN: 25.0000 ug | INTRAMUSCULAR | Status: DC | PRN

## 2019-01-23 MED ORDER — SODIUM CHLORIDE 0.9 % IV SOLN: 100.0000 mL | INTRAVENOUS | Status: DC | PRN

## 2019-01-23 MED ORDER — ONDANSETRON HCL 4 MG/2ML IJ SOLN
INTRAMUSCULAR | Status: AC
  Filled 2019-01-23: qty 2

## 2019-01-23 MED ORDER — PHENYLEPHRINE 40 MCG/ML (10ML) SYRINGE FOR IV PUSH (FOR BLOOD PRESSURE SUPPORT)
PREFILLED_SYRINGE | INTRAVENOUS | Status: AC
  Filled 2019-01-23: qty 10

## 2019-01-23 MED ORDER — PHENYLEPHRINE 40 MCG/ML (10ML) SYRINGE FOR IV PUSH (FOR BLOOD PRESSURE SUPPORT)
PREFILLED_SYRINGE | INTRAVENOUS | Status: DC | PRN
  Administered 2019-01-23: 80 ug via INTRAVENOUS

## 2019-01-23 MED ORDER — HEPARIN SODIUM (PORCINE) 1000 UNIT/ML IJ SOLN
INTRAMUSCULAR | Status: AC
  Administered 2019-01-23: 3400 [IU]
  Filled 2019-01-23: qty 4

## 2019-01-23 MED ORDER — FENTANYL CITRATE (PF) 250 MCG/5ML IJ SOLN
INTRAMUSCULAR | Status: DC | PRN
  Administered 2019-01-23: 50 ug via INTRAVENOUS
  Administered 2019-01-23: 25 ug via INTRAVENOUS

## 2019-01-23 MED ORDER — PROMETHAZINE HCL 25 MG/ML IJ SOLN: 6.2500 mg | INTRAMUSCULAR | Status: DC | PRN

## 2019-01-23 MED ORDER — PROPOFOL 10 MG/ML IV BOLUS
INTRAVENOUS | Status: DC | PRN
  Administered 2019-01-23: 50 mg via INTRAVENOUS
  Administered 2019-01-23: 100 mg via INTRAVENOUS

## 2019-01-23 MED ORDER — SODIUM CHLORIDE 0.9 % IV SOLN
INTRAVENOUS | Status: DC | PRN
  Administered 2019-01-23: 14:00:00

## 2019-01-23 MED ORDER — LIDOCAINE 2% (20 MG/ML) 5 ML SYRINGE
INTRAMUSCULAR | Status: DC | PRN
  Administered 2019-01-23: 80 mg via INTRAVENOUS

## 2019-01-23 MED ORDER — LIDOCAINE HCL (PF) 1 % IJ SOLN
INTRAMUSCULAR | Status: AC
  Filled 2019-01-23: qty 30

## 2019-01-23 MED ORDER — LIDOCAINE 2% (20 MG/ML) 5 ML SYRINGE
INTRAMUSCULAR | Status: AC
  Filled 2019-01-23: qty 5

## 2019-01-23 MED ORDER — SODIUM CHLORIDE 0.9 % IV SOLN
INTRAVENOUS | Status: DC | PRN
  Administered 2019-01-23: 40 ug/min via INTRAVENOUS

## 2019-01-23 MED ORDER — PROPOFOL 10 MG/ML IV BOLUS
INTRAVENOUS | Status: AC
  Filled 2019-01-23: qty 20

## 2019-01-23 SURGICAL SUPPLY — 63 items
ARMBAND PINK RESTRICT EXTREMIT (MISCELLANEOUS) ×4 IMPLANT
BAG DECANTER FOR FLEXI CONT (MISCELLANEOUS) ×2 IMPLANT
BIOPATCH RED 1 DISK 7.0 (GAUZE/BANDAGES/DRESSINGS) ×2 IMPLANT
CANISTER SUCT 3000ML PPV (MISCELLANEOUS) ×2 IMPLANT
CANNULA VESSEL 3MM 2 BLNT TIP (CANNULA) IMPLANT
CATH EMB 4FR 80CM (CATHETERS) ×2 IMPLANT
CATH PALINDROME RT-P 15FX19CM (CATHETERS) IMPLANT
CATH PALINDROME RT-P 15FX23CM (CATHETERS) IMPLANT
CATH PALINDROME RT-P 15FX28CM (CATHETERS) IMPLANT
CATH PALINDROME RT-P 15FX55CM (CATHETERS) IMPLANT
CATH STRAIGHT 5FR 65CM (CATHETERS) IMPLANT
CHLORAPREP W/TINT 26ML (MISCELLANEOUS) ×2 IMPLANT
CLIP VESOCCLUDE MED 6/CT (CLIP) ×2 IMPLANT
CLIP VESOCCLUDE SM WIDE 6/CT (CLIP) ×2 IMPLANT
COVER PROBE W GEL 5X96 (DRAPES) IMPLANT
COVER SURGICAL LIGHT HANDLE (MISCELLANEOUS) ×2 IMPLANT
COVER WAND RF STERILE (DRAPES) ×2 IMPLANT
DECANTER SPIKE VIAL GLASS SM (MISCELLANEOUS) ×2 IMPLANT
DERMABOND ADVANCED (GAUZE/BANDAGES/DRESSINGS)
DERMABOND ADVANCED .7 DNX12 (GAUZE/BANDAGES/DRESSINGS) ×2 IMPLANT
DRAPE C-ARM 42X72 X-RAY (DRAPES) ×2 IMPLANT
DRAPE CHEST BREAST 15X10 FENES (DRAPES) ×2 IMPLANT
DRAPE X-RAY CASS 24X20 (DRAPES) IMPLANT
DRSG COVADERM 4X14 (GAUZE/BANDAGES/DRESSINGS) IMPLANT
ELECT REM PT RETURN 9FT ADLT (ELECTROSURGICAL)
ELECTRODE REM PT RTRN 9FT ADLT (ELECTROSURGICAL) ×2 IMPLANT
GAUZE 4X4 16PLY RFD (DISPOSABLE) ×2 IMPLANT
GLOVE BIO SURGEON STRL SZ7.5 (GLOVE) ×2 IMPLANT
GOWN STRL REUS W/ TWL LRG LVL3 (GOWN DISPOSABLE) ×6 IMPLANT
GOWN STRL REUS W/TWL LRG LVL3 (GOWN DISPOSABLE)
HEMOSTAT SPONGE AVITENE ULTRA (HEMOSTASIS) IMPLANT
KIT BASIN OR (CUSTOM PROCEDURE TRAY) ×2 IMPLANT
KIT TURNOVER KIT B (KITS) ×2 IMPLANT
LOOP VESSEL MINI RED (MISCELLANEOUS) IMPLANT
NDL 18GX1X1/2 (RX/OR ONLY) (NEEDLE) ×2 IMPLANT
NDL HYPO 25GX1X1/2 BEV (NEEDLE) ×2 IMPLANT
NEEDLE 18GX1X1/2 (RX/OR ONLY) (NEEDLE) IMPLANT
NEEDLE HYPO 25GX1X1/2 BEV (NEEDLE) IMPLANT
NS IRRIG 1000ML POUR BTL (IV SOLUTION) ×2 IMPLANT
PACK CV ACCESS (CUSTOM PROCEDURE TRAY) ×2 IMPLANT
PACK SURGICAL SETUP 50X90 (CUSTOM PROCEDURE TRAY) ×2 IMPLANT
PAD ARMBOARD 7.5X6 YLW CONV (MISCELLANEOUS) ×4 IMPLANT
PROTECTION STATION PRESSURIZED (MISCELLANEOUS)
SET COLLECT BLD 21X3/4 12 (NEEDLE) IMPLANT
SET MICROPUNCTURE 5F STIFF (MISCELLANEOUS) IMPLANT
STATION PROTECTION PRESSURIZED (MISCELLANEOUS) IMPLANT
STOPCOCK 4 WAY LG BORE MALE ST (IV SETS) IMPLANT
SUT ETHILON 3 0 PS 1 (SUTURE) ×2 IMPLANT
SUT PROLENE 6 0 CC (SUTURE) ×2 IMPLANT
SUT VIC AB 3-0 SH 27 (SUTURE)
SUT VIC AB 3-0 SH 27X BRD (SUTURE) ×2 IMPLANT
SUT VICRYL 4-0 PS2 18IN ABS (SUTURE) ×2 IMPLANT
SYR 10ML LL (SYRINGE) ×2 IMPLANT
SYR 20CC LL (SYRINGE) ×4 IMPLANT
SYR CONTROL 10ML LL (SYRINGE) ×2 IMPLANT
SYRINGE 6CC (MISCELLANEOUS) IMPLANT
TOWEL GREEN STERILE (TOWEL DISPOSABLE) ×4 IMPLANT
TOWEL GREEN STERILE FF (TOWEL DISPOSABLE) ×2 IMPLANT
TUBING CIL FLEX 10 FLL-RA (TUBING) IMPLANT
TUBING EXTENTION W/L.L. (IV SETS) IMPLANT
UNDERPAD 30X30 (UNDERPADS AND DIAPERS) ×2 IMPLANT
WATER STERILE IRR 1000ML POUR (IV SOLUTION) ×2 IMPLANT
WIRE AMPLATZ SS-J .035X180CM (WIRE) IMPLANT

## 2019-01-23 NOTE — Consult Note (Addendum)
Hospital Consult   Reason for Consult:  Malfunctioning L arm fistula Requesting Physician:  Dr. Johnney Ou MRN #:  [id number]  History of Present Illness: This is a 78 y.o. male with chronic kidney disease who presented to the emergency department with nausea/vomiting, shortness of breath.  He has been evaluated by nephrology who believes he is now end-stage renal disease and will require initiation of hemodialysis.  Surgical history is significant for left radiocephalic fistula that never fully matured.  He then underwent single-stage left basilic vein transposition by Dr. Donzetta Matters in September 2019.  This fistula was used of the for the first time last night for hemodialysis however after only a few minutes of running time fistula infiltrated.  This morning on exam left arm fistula appears to be occluded.  He was made n.p.o. today in anticipation of revascularization procedure.  He is not taking any blood thinners.  Past Medical History:  Diagnosis Date  . Anemia of chronic kidney failure   . Bradycardia   . Chronic kidney disease    [facility]  . Diabetes mellitus without complication (HCC)    diet controlled   . Diabetic nephropathy ([place])   . Gout   . Gout   . Hyperlipidemia   . Hypothyroidism   . Neuropathy   . Peripheral neuropathy   . Peripheral vascular disease ([place])   . Secondary hyperparathyroidism ([place])    Renal  . Thyroid disease     Past Surgical History:  Procedure Laterality Date  . AV FISTULA PLACEMENT Left 11/19/2017   Procedure: ARTERIOVENOUS (AV) FISTULA CREATION;  Surgeon: [name], MD;  Location: [facility];  Service: Vascular;  Laterality: Left;  . BASCILIC VEIN TRANSPOSITION Left 06/04/2018   Procedure: BASILIC VEIN TRANSPOSITION ARM;  Surgeon: [name], MD;  Location: [facility];  Service: Vascular;  Laterality: Left;  . COLONOSCOPY    . EYE SURGERY     cataract surgery  . KNEE ARTHROSCOPY Left    Knee    Allergies  Allergen  Reactions  . Ace Inhibitors Other (See Comments)    Hyperkalemia on ACE INHIBITORS Cr>1.9  . Adhesive [Tape] Other (See Comments)    Can tolerate ONLY Coban wrap or mild paper tape!!  . Angiotensin Receptor Blockers Other (See Comments)    Hyperkalemia   . Iodine-131 Other (See Comments)    Cannot have due to renal failure   . Ivp Dye [Iodinated Diagnostic Agents] Other (See Comments)    Cannot have due to renal failure  . Other Other (See Comments)    NO IV dyes due to renal failure     Prior to Admission medications   Medication Sig Start Date End Date Taking? Authorizing Provider  allopurinol (ZYLOPRIM) 100 MG tablet Take 100 mg by mouth daily.  09/12/13  Yes [provider]  aspirin EC 81 MG tablet Take 81 mg daily by mouth.   Yes [provider]  atorvastatin (LIPITOR) 20 MG tablet Take 20 mg daily by mouth.   Yes [provider]  calcitRIOL (ROCALTROL) 0.25 MCG capsule Take 0.25 mcg by mouth daily.   Yes [provider]  docusate sodium (COLACE) 100 MG capsule Take 200 mg by mouth 2 (two) times daily.   Yes [provider]  furosemide (LASIX) 20 MG tablet Take 20 mg by mouth daily.  11/25/13  Yes [provider]  gabapentin (NEURONTIN) 100 MG capsule Take 300 mg by mouth daily at 3 pm.    Yes [provider]  HYDROcodone-acetaminophen (NORCO) 10-325 MG tablet Take 0.5 tablets by mouth See admin instructions. Take 0.5 tablet by mouth up to 6 times a day as needed for pain   Yes [provider]  levothyroxine (SYNTHROID) 150 MCG tablet Take 150 mcg by mouth daily before breakfast.   Yes [provider]  multivitamin (RENA-VIT) TABS tablet Take 1 tablet daily by mouth.   Yes [provider]  ondansetron (ZOFRAN) 4 MG tablet Take 4 mg by mouth every 8 (eight) hours as needed for nausea or vomiting.  01/19/19  Yes [provider]  sodium bicarbonate 650 MG tablet Take 650 mg by mouth 2 (two)  times daily. 01/12/19  Yes [provider]  tamsulosin (FLOMAX) 0.4 MG CAPS capsule Take 0.4 mg by mouth daily.    Yes [provider]  Zinc Oxide (BALMEX EX) Apply 1 application topically daily as needed (to irritated areas of skin).    Yes [provider]  HYDROcodone-acetaminophen (NORCO) 5-325 MG tablet Take 1 tablet by mouth every 6 (six) hours as needed for moderate pain. 06/04/18   Dagoberto Ligas, PA-C  oxyCODONE (OXY IR/ROXICODONE) 5 MG immediate release tablet Take 5 mg by mouth every 4 (four) hours as needed (for pain).  12/30/18   [provider]    Social History   Socioeconomic History  . Marital status: Divorced    Spouse name: Not on file  . Number of children: Not on file  . Years of education: Not on file  . Highest education level: Not on file  Occupational History  . Not on file  Social Needs  . Financial resource strain: Not on file  . Food insecurity:    Worry: Not on file    Inability: Not on file  . Transportation needs:    Medical: Not on file    Non-medical: Not on file  Tobacco Use  . Smoking status: Former Smoker    Years: 30.00    Last attempt to quit: 1994    Years since quitting: 26.3  . Smokeless tobacco: Never Used  Substance and Sexual Activity  . Alcohol use: No  . Drug use: No  . Sexual activity: Not on file  Lifestyle  . Physical activity:    Days per week: Not on file    Minutes per session: Not on file  . Stress: Not on file  Relationships  . Social connections:    Talks on phone: Not on file    Gets together: Not on file    Attends religious service: Not on file    Active member of club or organization: Not on file    Attends meetings of clubs or organizations: Not on file    Relationship status: Not on file  . Intimate partner violence:    Fear of current or ex partner: Not on file    Emotionally abused: Not on file    Physically abused: Not on file    Forced sexual activity: Not on file   Other Topics Concern  . Not on file  Social History Narrative  . Not on file     Family History  Problem Relation Age of Onset  . Diabetes Mother   . Kidney disease Mother   . Liver disease Mother   . Heart Problems Mother     ROS: Otherwise negative unless mentioned in HPI  Physical Examination  Vitals:   01/23/19 0440 01/23/19 0939  BP: 124/76 136/71  Pulse: 66 70  Resp: (!) 24   Temp: 98 F (36.7 C) 98.8 F (37.1 C)  SpO2: 96% 98%   Body mass index is 28.72 kg/m.  General:  WDWN in NAD Gait: Not observed HENT: WNL, normocephalic Pulmonary: normal non-labored breathing Cardiac: regular Skin: without rashes Vascular Exam/Pulses: palpable and symmetrical radial pulses Extremities: without ischemic changes, without Gangrene , without cellulitis; without open wounds;  Musculoskeletal: no muscle wasting or atrophy  Neurologic: A&O X 3;  No focal weakness or paresthesias are detected; speech is fluent/normal Psychiatric:  The pt has Normal affect. Lymph:  Unremarkable  CBC    Component Value Date/Time   WBC 9.7 01/23/2019 0349   RBC 3.52 (L) 01/23/2019 0349   HGB 11.0 (L) 01/23/2019 0349   HCT 32.8 (L) 01/23/2019 0349   PLT 168 01/23/2019 0349   MCV 93.2 01/23/2019 0349   MCH 31.3 01/23/2019 0349   MCHC 33.5 01/23/2019 0349   RDW 14.0 01/23/2019 0349    BMET    Component Value Date/Time   NA 141 01/23/2019 0349   K 3.1 (L) 01/23/2019 0349   CL 104 01/23/2019 0349   CO2 24 01/23/2019 0349   GLUCOSE 192 (H) 01/23/2019 0349   BUN 114 (H) 01/23/2019 0349   CREATININE 6.25 (H) 01/23/2019 0349   CALCIUM 8.0 (L) 01/23/2019 0349   GFRNONAA 8 (L) 01/23/2019 0349   GFRAA 9 (L) 01/23/2019 0349    COAGS: No results found for: INR, PROTIME   ASSESSMENT/PLAN: This is a 78 y.o. male with work standing CKD now end-stage renal disease requiring initiation of hemodialysis  By physical exam left arm AV fistula appears to be occluded Plan will be for left arm  attempted thrombectomy and revision of AV fistula with possible tunneled dialysis catheter placement today 01/23/2019 Please keep patient n.p.o. On-call vascular surgeon Dr. Oneida Alar will evaluate the patient and provide further treatment plan.    Dagoberto Ligas PA-C [facility] [phone]   Left upper arm fistula palpable as a cord Occluded fistula after first time cannulation Will do thrombectomy and fistulogram today Will place catheter to rest fistula and also have in place in event of reocclusion  Ruta Hinds, MD [facility] [place]: [phone] Pager: [phone]

## 2019-01-23 NOTE — Progress Notes (Signed)
Pt initially was to have declot of fistula in addition to catheter.  Unfortunately, we now have another pt with a rupture AAA in the ER.  Spoke with daughter told her he now has access with catheter and we will reschedule fistula declot.  Ruta Hinds, MD [facility] [place] Office: [phone] Pager: [phone]

## 2019-01-23 NOTE — Progress Notes (Signed)
[facility] Progress Note   Subjective:   Last PM dialysis attempted and high venous pressure, aspirated clots then venous infiltrated.  He has no new complaints this AM except NPO.    Objective Vitals:   01/22/19 1500 01/23/19 0315 01/23/19 0440 01/23/19 0939  BP: 131/65  124/76 136/71  Pulse: 68  66 70  Resp: 19  (!) 24   Temp: 97.6 F (36.4 C)  98 F (36.7 C) 98.8 F (37.1 C)  TempSrc: Oral  Oral Oral  SpO2: 98%  96% 98%  Weight:  85.7 kg    Height:       Physical Exam General: alert in bed, no distress Heart: RRR, no rub Lungs:clear  Abdomen: soft Extremities: no edema Dialysis Access:  LUE AVF without thrill or bruit  Additional Objective Labs: Basic Metabolic Panel: Recent Labs  Lab 01/21/19 1501 01/21/19 2304 01/22/19 0541 01/23/19 0349  NA 137  --  144 141  K 3.2*  --  3.0* 3.1*  CL 100  --  103 104  CO2 18*  --  23 24  GLUCOSE 155*  --  142* 192*  BUN 124*  --  123* 114*  CREATININE 6.52* 6.48* 6.49* 6.25*  CALCIUM 8.8*  --  8.6* 8.0*  PHOS  --   --   --  5.0*   Liver Function Tests: Recent Labs  Lab 01/22/19 0541 01/23/19 0349  AST 48*  --   ALT 27  --   ALKPHOS 68  --   BILITOT 0.5  --   PROT 6.4*  --   ALBUMIN 3.0* 2.8*   No results for input(s): LIPASE, AMYLASE in the last 168 hours. CBC: Recent Labs  Lab 01/21/19 1501 01/21/19 2304 01/22/19 0541 01/23/19 0349  WBC 10.0 8.9 7.8 9.7  HGB 13.0 12.0* 11.6* 11.0*  HCT 39.5 35.7* 34.1* 32.8*  MCV 94.3 92.5 91.7 93.2  PLT 198 175 169 168   Blood Culture    Component Value Date/Time   SDES URINE, RANDOM 01/21/2019 1551   SPECREQUEST NONE 01/21/2019 1551   CULT  01/21/2019 1551    NO GROWTH Performed at [facility], [place] [address] [place], [place] [address]    REPTSTATUS 01/22/2019 FINAL 01/21/2019 1551    Cardiac Enzymes: Recent Labs  Lab 01/21/19 1501 01/21/19 1841 01/21/19 2304 01/22/19 0541 01/22/19 1044  TROPONINI 0.07* 0.08* 0.09* 0.10*  0.09*   CBG: Recent Labs  Lab 01/22/19 0559 01/22/19 1202 01/22/19 1618 01/23/19 0040 01/23/19 0602  GLUCAP 137* 228* 126* 180* 127*   Iron Studies: No results for input(s): IRON, TIBC, TRANSFERRIN, FERRITIN in the last 72 hours. @lablastinr3 @ Studies/Results: Dg Chest Port 1 View  Result Date: 01/21/2019 CLINICAL DATA:  Generalized weakness EXAM: PORTABLE CHEST 1 VIEW COMPARISON:  None. FINDINGS: Elevated right diaphragm with mild right lower lobe atelectasis. Lungs otherwise clear. Negative for heart failure. IMPRESSION: Mild right lower lobe atelectasis. Electronically Signed   By: Franchot Gallo M.D.   On: 01/21/2019 17:36   Medications:  . allopurinol  100 mg Oral Daily  . aspirin EC  81 mg Oral Daily  . atorvastatin  20 mg Oral Daily  . calcitRIOL  0.25 mcg Oral Daily  . Chlorhexidine Gluconate Cloth  6 each Topical Q0600  . gabapentin  300 mg Oral Q1500  . heparin  5,000 Units Subcutaneous Q8H  . insulin aspart  0-9 Units Subcutaneous TID WC  . levothyroxine  150 mcg Oral Q0600  . multivitamin  1 tablet Oral Daily  . sodium bicarbonate  650 mg Oral BID  . tamsulosin  0.4 mg Oral Daily    Assessment/Plan **CKD 5 now with uremic symptoms: initiating dialysis.  AVF malfunction yesterday, no dialysis possible.  VVS consulted - will eval today.  NPO for procedure if possible today. CLIP underway.  **BMM: on calcitriol 0.25 daily.  Cont.  Ca ok. Phos 5. Not on binder.   **metabolic acidosis: cont outpt dose na bicarb for now, but once dialysis full treatment will stop.   **Anemia of CKD: mild, no indications for therapy at this time, monitor.   **near syncope:  TTE pending, trop cycled ~negative, no CP. On tele. Appears well currently.   **DM: on insulin, per primary  **hypothyroidism: on home synthroid.   Jannifer Hick MD 01/23/2019, 9:55 AM  [facility] Pager: [phone]

## 2019-01-23 NOTE — Transfer of Care (Signed)
Immediate Anesthesia Transfer of Care Note  Patient: Frank [name]  Procedure(s) Performed: INSERTION OF DIALYSIS CATHETER (N/A )  Patient Location: PACU  Anesthesia Type:General  Level of Consciousness: awake, alert  and oriented  Airway & Oxygen Therapy: Patient Spontanous Breathing  Post-op Assessment: Report given to RN  Post vital signs: Reviewed and stable  Last Vitals:  Vitals Value Taken Time  BP 160/88 01/23/2019  2:30 PM  Temp    Pulse 79 01/23/2019  2:32 PM  Resp 17 01/23/2019  2:32 PM  SpO2 99 % 01/23/2019  2:32 PM  Vitals shown include unvalidated device data.  Last Pain:  Vitals:   01/23/19 1147  TempSrc: Oral  PainSc:          Complications: No apparent anesthesia complications

## 2019-01-23 NOTE — Anesthesia Preprocedure Evaluation (Addendum)
Anesthesia Evaluation  Patient identified by MRN, date of birth, ID band Patient awake    Reviewed: Allergy & Precautions, NPO status , Patient's Chart, lab work & pertinent test results  Airway Mallampati: II  TM Distance: >3 FB Neck ROM: Full    Dental  (+) Edentulous Lower, Edentulous Upper   Pulmonary former smoker,    Pulmonary exam normal breath sounds clear to auscultation       Cardiovascular hypertension, Pt. on medications + Peripheral Vascular Disease  Normal cardiovascular exam Rhythm:Regular Rate:Normal  Clotted left arm AV fistula Echo 01/22/2019 1. The left ventricle has normal systolic function with an ejection fraction of 60-65%. The cavity size was normal. There is moderate concentric left ventricular hypertrophy. Left ventricular diastolic Doppler parameters are consistent with impaired relaxation.  2. The right ventricle has normal systolic function. The cavity was normal. There is no increase in right ventricular wall thickness.  3. Left atrial size was mildly dilated.  4. The mitral valve is degenerative. Mild thickening of the mitral valve leaflet. There is mild mitral annular calcification present.  5. The aortic valve is tricuspid. Mild thickening of the aortic valve. Mild calcification of the aortic valve. Aortic valve regurgitation was not assessed by color flow Doppler.    Neuro/Psych Peripheral neuropathy  Neuromuscular disease negative psych ROS   GI/Hepatic Neg liver ROS, GERD  Medicated and Controlled,  Endo/Other  diabetes, Well Controlled, Type 2Hypothyroidism Hyperlipidemia Secondary hyperparathyroidism  Renal/GU Dialysis and ESRFRenal disease  negative genitourinary   Musculoskeletal  (+) Arthritis , Gout   Abdominal   Peds  Hematology  (+) anemia ,   Anesthesia Other Findings Day of surgery medications reviewed with the patient.  Reproductive/Obstetrics                             Anesthesia Physical Anesthesia Plan  ASA: IV  Anesthesia Plan: General   Post-op Pain Management:    Induction: Intravenous  PONV Risk Score and Plan: 3 and Treatment may vary due to age or medical condition and Ondansetron  Airway Management Planned: LMA  Additional Equipment:   Intra-op Plan:   Post-operative Plan: Extubation in OR  Informed Consent: I have reviewed the patients History and Physical, chart, labs and discussed the procedure including the risks, benefits and alternatives for the proposed anesthesia with the patient or authorized representative who has indicated his/her understanding and acceptance.     Dental advisory given  Plan Discussed with: CRNA and Surgeon  Anesthesia Plan Comments:        Anesthesia Quick Evaluation

## 2019-01-23 NOTE — Op Note (Signed)
Procedure: Ultrasound-guided insertion of Palindrome catheter  Preoperative diagnosis: End-stage renal disease  Postoperative diagnosis: Same  Anesthesia: Local with IV sedation  Operative findings: 23 cm Palindrome catheter right internal jugular vein  Operative details: After obtaining informed consent, the patient was taken to the operating room. The patient was placed in supine position on the operating room table. After adequate sedation the patient's entire neck and chest were prepped and draped in usual sterile fashion. The patient was placed in Trendelenburg position. Ultrasound was used to identify the patient's right internal jugular vein. This had normal compressibility and respiratory variation. Local anesthesia was infiltrated over the right jugular vein.  Using ultrasound guidance, the right internal jugular vein was successfully cannulated.  A 0.035 J-tipped guidewire was threaded into the right internal jugular vein and into the superior vena cava followed by the inferior vena cava under fluoroscopic guidance.   Next sequential 12 and 14 dilators were placed over the guidewire into the right atrium.  A 16 French dilator with a peel-away sheath was then placed over the guidewire into the right atrium.   The guidewire and dilator were removed. A 23 cm Palindrome catheter was then placed through the peel away sheath into the right atrium.  The catheter was then tunneled subcutaneously, cut to length, and the hub attached. The catheter was noted to flush and draw easily. The catheter was inspected under fluoroscopy and found with its tip to be in the right atrium without any kinks throughout its course. The catheter was sutured to the skin with nylon sutures. The neck insertion site was closed with Vicryl stitch. The catheter was then loaded with concentrated Heparin solution. A dry sterile dressing was applied.  The patient tolerated procedure well and there were no complications. Instrument  sponge and needle counts correct in the case. The patient was taken to the recovery room in stable condition. Chest x-ray will be obtained in the recovery room.  Ruta Hinds, MD [facility] [place] Office: [phone] Pager: [phone]

## 2019-01-23 NOTE — Progress Notes (Signed)
PROGRESS NOTE    Frank [name]  FYB:[id number] DOB: 1941/06/13 DOA: 01/21/2019 PCP: Chester Holstein, MD   Brief Narrative:  HPI On 01/21/2019 by Dr. Gean Birchwood Frank [name] is a 78 y.o. male with history of chronic kidney disease stage V with left arm AV fistula follows with Dr. Justin Mend nephrologist per patient's daughter who provided history to the ER physician has been having intermittent nausea vomiting last few days and had followed up with his primary care physician who drew labs and found his creatinine be significantly elevated.  The labs available in care everywhere shows creatinine of 4.8 in January 2020.  In addition patient has been having some near syncopal episodes since this morning.  His vomiting is stopped denies any diarrhea chest pain shortness of breath.  Interim history Admitted with near syncopal event.  Also found to have progressive kidney disease and started on dialysis.  However now having clots from fistula.  Vascular surgery consulted, pending fistulogram. Assessment & Plan   Near Syncope -Patient feels he was dehydrated-as he states he had vomiting prior to admission -Denies any current dizziness -COVID negative -Orthostatic vitals positive drop from supine to standing -Echocardiogram EF 60 to 65%.  LV diastolic parameters consistent with impaired relaxation. -PT consulted no further therapy needed.  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 Justin Mend, nephrology -Per Care everywhere, in January creatinine was 4.82 -Creatinine currently 6.49 -Patient is able to produce urine but states it has painful at times and dark in color -had had forgetfulness as well as is her nausea and dysphagia -Nephrology consulted and  appreciated. Discussed with Dr. Johnney Ou- patient will start dialysis today, have HD session on 5/15, and one on 5/16.  -patient does have a LUE AVF in place, but possibly not functional- vascular surgery consulted and appreciated  Hypokalemia -Replace and monitor BMP  Hypothyroidism -Continue Synthroid  Diabetes mellitus, type II -Appears to be diet controlled as patient is not on any home medications -Last hemoglobin A1c was 5.9 on 06/04/2018  Chronic pain -Continue home regimen  BPH -Continue Flomax  DVT Prophylaxis  heparin  Code Status: Full  Family Communication: None at bedside.  Discussed with daughter.  Disposition Plan: Admitted. Pending further recommendations from nephrology. Suspect home upon discharge.   Consultants Nephrology Vascular surgery  Procedures  Echocardiogram  Antibiotics   Anti-infectives (From admission, onward)   None      Subjective:   [name] seen and examined today.  Patient has no complaints this morning.  Denies current chest pain, shortness of breath, abdominal pain, nausea or vomiting, diarrhea or constipation, dizziness or headache.  Objective:   Vitals:   01/22/19 1420 01/22/19 1500 01/23/19 0315 01/23/19 0440  BP: 139/73 131/65  124/76  Pulse: 71 68  66  Resp: 19 19  (!) 24  Temp:  97.6 F (36.4 C)  98 F (36.7 C)  TempSrc:  Oral  Oral  SpO2:  98%  96%  Weight:   85.7 kg   Height:        Intake/Output Summary (Last 24 hours) at 01/23/2019 0933 Last data filed at 01/23/2019 0808 Gross per 24 hour  Intake 240 ml  Output 254 ml  Net -14 ml   Filed Weights   01/22/19 0429 01/22/19 1405 01/23/19  0315  Weight: 84.5 kg 85.7 kg 85.7 kg   Exam  General: Well developed, well nourished, NAD, appears stated age  2: NCAT, mucous membranes moist.   Cardiovascular: S1 S2 auscultated, RRR  Respiratory: Clear to auscultation bilaterally  Abdomen: Soft, nontender, nondistended, + bowel sounds   Extremities: warm dry without cyanosis clubbing or edema. LUE AVF  Neuro: AAOx3, nonfocal  Psych: Pleasant, appropriate mood and affect  Data Reviewed: I have personally reviewed following labs and imaging studies  CBC: Recent Labs  Lab 01/21/19 1501 01/21/19 2304 01/22/19 0541 01/23/19 0349  WBC 10.0 8.9 7.8 9.7  HGB 13.0 12.0* 11.6* 11.0*  HCT 39.5 35.7* 34.1* 32.8*  MCV 94.3 92.5 91.7 93.2  PLT 198 175 169 476   Basic Metabolic Panel: Recent Labs  Lab 01/21/19 1501 01/21/19 2304 01/22/19 0541 01/22/19 1044 01/23/19 0349  NA 137  --  144  --  141  K 3.2*  --  3.0*  --  3.1*  CL 100  --  103  --  104  CO2 18*  --  23  --  24  GLUCOSE 155*  --  142*  --  192*  BUN 124*  --  123*  --  114*  CREATININE 6.52* 6.48* 6.49*  --  6.25*  CALCIUM 8.8*  --  8.6*  --  8.0*  MG  --   --   --  1.9  --   PHOS  --   --   --   --  5.0*   GFR: Estimated Creatinine Clearance: 10.5 mL/min (A) (by C-G formula based on SCr of 6.25 mg/dL (H)). Liver Function Tests: Recent Labs  Lab 01/22/19 0541 01/23/19 0349  AST 48*  --   ALT 27  --   ALKPHOS 68  --   BILITOT 0.5  --   PROT 6.4*  --   ALBUMIN 3.0* 2.8*   No results for input(s): LIPASE, AMYLASE in the last 168 hours. No results for input(s): AMMONIA in the last 168 hours. Coagulation Profile: No results for input(s): INR, PROTIME in the last 168 hours. Cardiac Enzymes: Recent Labs  Lab 01/21/19 1501 01/21/19 1841 01/21/19 2304 01/22/19 0541 01/22/19 1044  TROPONINI 0.07* 0.08* 0.09* 0.10* 0.09*   BNP (last 3 results) No results for input(s): PROBNP in the last 8760 hours. HbA1C: No results for input(s): HGBA1C in the last 72 hours. CBG: Recent Labs  Lab 01/22/19 0559 01/22/19 1202 01/22/19 1618 01/23/19 0040 01/23/19 0602  GLUCAP 137* 228* 126* 180* 127*   Lipid Profile: No results for input(s): CHOL, HDL, LDLCALC, TRIG, CHOLHDL, LDLDIRECT in the last 72 hours. Thyroid Function Tests: No results for  input(s): TSH, T4TOTAL, FREET4, T3FREE, THYROIDAB in the last 72 hours. Anemia Panel: No results for input(s): VITAMINB12, FOLATE, FERRITIN, TIBC, IRON, RETICCTPCT in the last 72 hours. Urine analysis:    Component Value Date/Time   COLORURINE YELLOW 01/21/2019 1551   APPEARANCEUR HAZY (A) 01/21/2019 1551   LABSPEC 1.013 01/21/2019 1551   PHURINE 5.0 01/21/2019 1551   GLUCOSEU 50 (A) 01/21/2019 1551   HGBUR MODERATE (A) 01/21/2019 1551   BILIRUBINUR NEGATIVE 01/21/2019 1551   KETONESUR 5 (A) 01/21/2019 1551   PROTEINUR >=300 (A) 01/21/2019 1551   NITRITE NEGATIVE 01/21/2019 1551   LEUKOCYTESUR NEGATIVE 01/21/2019 1551   Sepsis Labs: @LABRCNTIP (procalcitonin:4,lacticidven:4)  ) Recent Results (from the past 240 hour(s))  Urine Culture     Status: None   Collection Time: 01/21/19  3:51 PM  Result Value Ref Range Status   Specimen Description URINE, RANDOM  Final   Special Requests NONE  Final   Culture   Final    NO GROWTH Performed at [facility], [address] [place], [place] [address]    Report Status 01/22/2019 FINAL  Final  SARS Coronavirus 2 [facility] order, Performed in [place] [facility] lab)     Status: None   Collection Time: 01/21/19  4:24 PM  Result Value Ref Range Status   SARS Coronavirus 2 NEGATIVE NEGATIVE Final    Comment: (NOTE) If result is NEGATIVE SARS-CoV-2 target nucleic acids are NOT DETECTED. The SARS-CoV-2 RNA is generally detectable in upper and lower  respiratory specimens during the acute phase of infection. The lowest  concentration of SARS-CoV-2 viral copies this assay can detect is 250  copies / mL. A negative result does not preclude SARS-CoV-2 infection  and should not be used as the sole basis for treatment or other  patient management decisions.  A negative result may occur with  improper specimen collection / handling, submission of specimen other  than nasopharyngeal swab, presence of viral mutation(s) within the  areas  targeted by this assay, and inadequate number of viral copies  (<250 copies / mL). A negative result must be combined with clinical  observations, patient history, and epidemiological information. If result is POSITIVE SARS-CoV-2 target nucleic acids are DETECTED. The SARS-CoV-2 RNA is generally detectable in upper and lower  respiratory specimens dur ing the acute phase of infection.  Positive  results are indicative of active infection with SARS-CoV-2.  Clinical  correlation with patient history and other diagnostic information is  necessary to determine patient infection status.  Positive results do  not rule out bacterial infection or co-infection with other viruses. If result is PRESUMPTIVE POSTIVE SARS-CoV-2 nucleic acids MAY BE PRESENT.   A presumptive positive result was obtained on the submitted specimen  and confirmed on repeat testing.  While 2019 novel coronavirus  (SARS-CoV-2) nucleic acids may be present in the submitted sample  additional confirmatory testing may be necessary for epidemiological  and / or clinical management purposes  to differentiate between  SARS-CoV-2 and other Sarbecovirus currently known to infect humans.  If clinically indicated additional testing with an alternate test  methodology [phone]) is advised. The SARS-CoV-2 RNA is generally  detectable in upper and lower respiratory sp ecimens during the acute  phase of infection. The expected result is Negative. Fact Sheet for Patients:  StrictlyIdeas.no Fact Sheet for Healthcare Providers: BankingDealers.co.za This test is not yet approved or cleared by the [place] FDA and has been authorized for detection and/or diagnosis of SARS-CoV-2 by FDA under an Emergency Use Authorization (EUA).  This EUA will remain in effect (meaning this test can be used) for the duration of the COVID-19 declaration under Section 564(b)(1) of the Act, 21 U.S.C. section  360bbb-3(b)(1), unless the authorization is terminated or revoked sooner. Performed at [facility], [place] [address] [place], [place] [address]       Radiology Studies: [facility] 1 View  Result Date: 01/21/2019 CLINICAL DATA:  Generalized weakness EXAM: PORTABLE CHEST 1 VIEW COMPARISON:  None. FINDINGS: Elevated right diaphragm with mild right lower lobe atelectasis. Lungs otherwise clear. Negative for heart failure. IMPRESSION: Mild right lower lobe atelectasis. Electronically Signed   By: Franchot Gallo M.D.   On: 01/21/2019 17:36     Scheduled Meds: . allopurinol  100 mg Oral Daily  .  aspirin EC  81 mg Oral Daily  . atorvastatin  20 mg Oral Daily  . calcitRIOL  0.25 mcg Oral Daily  . Chlorhexidine Gluconate Cloth  6 each Topical Q0600  . gabapentin  300 mg Oral Q1500  . heparin  5,000 Units Subcutaneous Q8H  . insulin aspart  0-9 Units Subcutaneous TID WC  . levothyroxine  150 mcg Oral Q0600  . multivitamin  1 tablet Oral Daily  . sodium bicarbonate  650 mg Oral BID  . tamsulosin  0.4 mg Oral Daily   Continuous Infusions:   LOS: 1 day   Time Spent in minutes   30 minutes   Lugenia Assefa D.O. on 01/23/2019 at 9:33 AM  Between 7am to 7pm - Please see pager noted on [email or web address]  After 7pm go to [email or web address]  And look for the night coverage person covering for me after hours  [facility] Office  [phone]

## 2019-01-23 NOTE — Anesthesia Procedure Notes (Signed)
Procedure Name: LMA Insertion Date/Time: 01/23/2019 1:18 PM Performed by: Barrington Ellison, CRNA Pre-anesthesia Checklist: Patient identified, Emergency Drugs available, Suction available and Patient being monitored Patient Re-evaluated:Patient Re-evaluated prior to induction Oxygen Delivery Method: Circle System Utilized Preoxygenation: Pre-oxygenation with 100% oxygen Induction Type: IV induction Ventilation: Mask ventilation without difficulty LMA: LMA inserted LMA Size: 5.0 Number of attempts: 1 Airway Equipment and Method: Bite block Placement Confirmation: positive ETCO2 Tube secured with: Tape Dental Injury: Teeth and Oropharynx as per pre-operative assessment

## 2019-01-23 NOTE — Progress Notes (Signed)
Patient has been accepted at [facility] on a TTS schedule with a seat time of 10:15am. Dr. Kruska/Nephrology notified. Patient needs to arrive at 9:55am. He needs to report to the clinic at 9:30am on the first day of treatment to sign paperwork.  Renal Navigator was unable to reach patient in his room, but was transferred to his daughter, [name]/RN who is working today, by Tour manager. [name] is listed as ER contact in patient's chart and patient's RN states that [name] will be taking him to OP HD.  [name] states that her father drives, but that she and her siblings will transport him until he adjusts to treatment and evaluates if he feels comfortable driving himself. She states they toured the center in the fall and met with the Education officer, museum who informed them that she will apply for transportation for patient if ever needed. Schedule given to patient's daughter and she stated understanding. She was extremely appreciative. Renal Navigator will follow closely and notify OP HD clinic of patient's discharge plan/start date.   [facility] [address] [place], [place]  Frank [name] Renal Navigator [phone]

## 2019-01-24 LAB — PARATHYROID HORMONE, INTACT (NO CA): PTH: 184 pg/mL — ABNORMAL HIGH (ref 15–65)

## 2019-01-24 LAB — RENAL FUNCTION PANEL
Albumin: 2.8 g/dL — ABNORMAL LOW (ref 3.5–5.0)
Anion gap: 13 (ref 5–15)
BUN: 79 mg/dL — ABNORMAL HIGH (ref 8–23)
CO2: 24 mmol/L (ref 22–32)
Calcium: 8 mg/dL — ABNORMAL LOW (ref 8.9–10.3)
Chloride: 106 mmol/L (ref 98–111)
Creatinine, Ser: 4.58 mg/dL — ABNORMAL HIGH (ref 0.61–1.24)
GFR calc Af Amer: 13 mL/min — ABNORMAL LOW (ref >60)
GFR calc non Af Amer: 11 mL/min — ABNORMAL LOW (ref >60)
Glucose, Bld: 182 mg/dL — ABNORMAL HIGH (ref 70–99)
Phosphorus: 4.1 mg/dL (ref 2.5–4.6)
Potassium: 3.6 mmol/L (ref 3.5–5.1)
Sodium: 143 mmol/L (ref 135–145)

## 2019-01-24 LAB — CBC
HCT: 32.8 % — ABNORMAL LOW (ref 39.0–52.0)
Hemoglobin: 10.9 g/dL — ABNORMAL LOW (ref 13.0–17.0)
MCH: 31.2 pg (ref 26.0–34.0)
MCHC: 33.2 g/dL (ref 30.0–36.0)
MCV: 94 fL (ref 80.0–100.0)
Platelets: 144 10*3/uL — ABNORMAL LOW (ref 150–400)
RBC: 3.49 MIL/uL — ABNORMAL LOW (ref 4.22–5.81)
RDW: 14.2 % (ref 11.5–15.5)
WBC: 10.2 10*3/uL (ref 4.0–10.5)
nRBC: 0 % (ref 0.0–0.2)

## 2019-01-24 LAB — GLUCOSE, CAPILLARY
Glucose-Capillary: 116 mg/dL — ABNORMAL HIGH (ref 70–99)
Glucose-Capillary: 255 mg/dL — ABNORMAL HIGH (ref 70–99)
Glucose-Capillary: 177 mg/dL — ABNORMAL HIGH (ref 70–99)
Glucose-Capillary: 189 mg/dL — ABNORMAL HIGH (ref 70–99)

## 2019-01-24 LAB — MRSA PCR SCREENING: MRSA by PCR: NEGATIVE

## 2019-01-24 MED ORDER — BISACODYL 10 MG RE SUPP
10.0000 mg | Freq: Once | RECTAL | Status: AC
  Administered 2019-01-25: 10 mg via RECTAL
  Filled 2019-01-24: qty 1

## 2019-01-24 MED ORDER — CHLORHEXIDINE GLUCONATE CLOTH 2 % EX PADS: 6.0000 | MEDICATED_PAD | Freq: Every day | CUTANEOUS | Status: DC

## 2019-01-24 MED ORDER — ACETAMINOPHEN 325 MG PO TABS
ORAL_TABLET | ORAL | Status: AC
  Filled 2019-01-24: qty 2

## 2019-01-24 MED ORDER — HYDROCODONE-ACETAMINOPHEN 5-325 MG PO TABS
ORAL_TABLET | ORAL | Status: AC
  Filled 2019-01-24: qty 1

## 2019-01-24 MED ORDER — HEPARIN SODIUM (PORCINE) 1000 UNIT/ML IJ SOLN
INTRAMUSCULAR | Status: AC
  Administered 2019-01-24: 3400 [IU]
  Filled 2019-01-24: qty 4

## 2019-01-24 NOTE — Progress Notes (Signed)
PROGRESS NOTE    KELLIS MCADAM  RCB:[id number] DOB: [birth year] DOA: 01/21/2019 PCP: Chester Holstein, MD   Brief Narrative:  HPI On 01/21/2019 by Dr. Gean Birchwood DELONTAE LAMM is a 78 y.o. male with history of chronic kidney disease stage V with left arm AV fistula follows with Dr. Justin Mend nephrologist per patient's daughter who provided history to the ER physician has been having intermittent nausea vomiting last few days and had followed up with his primary care physician who drew labs and found his creatinine be significantly elevated.  The labs available in care everywhere shows creatinine of 4.8 in January 2020.  In addition patient has been having some near syncopal episodes since this morning.  His vomiting is stopped denies any diarrhea chest pain shortness of breath.  Interim history Admitted with near syncopal event.  Also found to have progressive kidney disease and started on dialysis.  However now having clots from fistula.  Vascular surgery consulted, pending fistulogram. Assessment & Plan   Near Syncope -Patient feels he was dehydrated-as he states he had vomiting prior to admission -Denies any current dizziness -COVID negative -Orthostatic vitals positive drop from supine to standing -Echocardiogram EF 60 to 65%.  LV diastolic parameters consistent with impaired relaxation. -PT consulted no further therapy needed.  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 Justin Mend, nephrology -Per Care everywhere, in January creatinine was 4.82 -Creatinine peaked to 6.49 -Patient is able to produce urine but states it has painful at times and dark in color -had had forgetfulness as well as is her nausea and dysphagia -Nephrology consulted and  appreciated. Discussed with Dr. Johnney Ou- patient will start dialysis today, have HD session on 5/15, and one on 5/16.  -patient does have a LUE AVF in place, but possibly not functional- vascular surgery consulted and appreciated -Was scheduled for fistula declot on 01/23/2019 however this did get pushed back.  Patient did receive ultrasound-guided insertion of palindrome catheter -He was successfully dialyzed on 01/23/2019 -Creatinine currently 4.58 -Of note, patient does have a HD slot at [facility] on TTS at 10:15 AM with report to clinic at 9:30 AM on the first day  Hypokalemia -Resolved, continue to monitor BMP  Hypothyroidism -Continue Synthroid  Diabetes mellitus, type II -Appears to be diet controlled as patient is not on any home medications -Last hemoglobin A1c was 5.9 on 06/04/2018  Chronic pain -Continue home regimen  BPH -Continue Flomax  DVT Prophylaxis  heparin  Code Status: Full  Family Communication: None at bedside.    Disposition Plan: Admitted. Pending further recommendations from nephrology and vascular surgery.  Suspect home upon discharge.   Consultants Nephrology Vascular surgery  Procedures  Echocardiogram  Antibiotics   Anti-infectives (From admission, onward)   None      Subjective:   [name] seen and examined today.  Patient states he felt better after dialysis yesterday evening and has not felt this good in many years.  Would like to have some ice water this morning.  Denies current chest pain, shortness of breath, abdominal pain, nausea or vomiting, diarrhea or constipation, dizziness or headache.  Objective:   Vitals:   01/23/19 2153 01/23/19 2200 01/23/19 2213 01/24/19 0620  BP: (!) 116/57 (!) 126/52 134/60 123/68  Pulse: 91 94 88 67  Resp: 18  16 20   Temp: 98.2 F (36.8 C)  98.7 F (37.1 C) 97.8 F (36.6 C)  TempSrc: Oral  Oral Oral  SpO2: 99%  100% 98%  Weight:    77.8 kg  Height:         Intake/Output Summary (Last 24 hours) at 01/24/2019 0819 Last data filed at 01/24/2019 0500 Gross per 24 hour  Intake 730 ml  Output 626 ml  Net 104 ml   Filed Weights   01/23/19 0315 01/23/19 25-Apr-1941 01/24/19 0620  Weight: 85.7 kg 85.7 kg 77.8 kg   Exam  General: Well developed, well nourished, NAD, appears stated age  78: NCAT, mucous membranes moist.   Neck: Supple, catheter in place- R  Cardiovascular: S1 S2 auscultated, RRR  Respiratory: Clear to auscultation bilaterally   Abdomen: Soft, nontender, nondistended, + bowel sounds  Extremities: warm dry without cyanosis clubbing or edema.  LUE aVF  Neuro: AAOx3, nonfocal  Psych: Appropriate mood and affect  Data Reviewed: I have personally reviewed following labs and imaging studies  CBC: Recent Labs  Lab 01/21/19 1501 01/21/19 2304 01/22/19 0541 01/23/19 0349 01/24/19 0401  WBC 10.0 8.9 7.8 9.7 10.2  HGB 13.0 12.0* 11.6* 11.0* 10.9*  HCT 39.5 35.7* 34.1* 32.8* 32.8*  MCV 94.3 92.5 91.7 93.2 94.0  PLT 198 175 169 168 676*   Basic Metabolic Panel: Recent Labs  Lab 01/21/19 1501 01/21/19 2304 01/22/19 0541 01/22/19 1044 01/23/19 0349 01/24/19 0401  NA 137  --  144  --  141 143  K 3.2*  --  3.0*  --  3.1* 3.6  CL 100  --  103  --  104 106  CO2 18*  --  23  --  24 24  GLUCOSE 155*  --  142*  --  192* 182*  BUN 124*  --  123*  --  114* 79*  CREATININE 6.52* 6.48* 6.49*  --  6.25* 4.58*  CALCIUM 8.8*  --  8.6*  --  8.0* 8.0*  MG  --   --   --  1.9  --   --   PHOS  --   --   --   --  5.0* 4.1   GFR: Estimated Creatinine Clearance: 13.1 mL/min (A) (by C-G formula based on SCr of 4.58 mg/dL (H)). Liver Function Tests: Recent Labs  Lab 01/22/19 0541 01/23/19 0349 01/24/19 0401  AST 48*  --   --   ALT 27  --   --   ALKPHOS 68  --   --   BILITOT 0.5  --   --   PROT 6.4*  --   --   ALBUMIN 3.0* 2.8* 2.8*   No results for input(s): LIPASE, AMYLASE in the last 168 hours. No results for input(s):  AMMONIA in the last 168 hours. Coagulation Profile: No results for input(s): INR, PROTIME in the last 168 hours. Cardiac Enzymes: Recent Labs  Lab 01/21/19 1501 01/21/19 1841 01/21/19 2304 01/22/19 0541 01/22/19 1044  TROPONINI 0.07* 0.08* 0.09* 0.10* 0.09*   BNP (last 3 results) No results for input(s): PROBNP in the last 8760 hours. HbA1C: No results for input(s): HGBA1C in the last 72 hours. CBG: Recent Labs  Lab 01/23/19 1148 01/23/19 1431 01/23/19 1625 01/23/19 2226 01/24/19 0620  GLUCAP 141* 145* 140* 282* 116*   Lipid Profile: No results for input(s): CHOL, HDL, LDLCALC, TRIG, CHOLHDL, LDLDIRECT in the last 72 hours. Thyroid Function Tests: No results for input(s): TSH, T4TOTAL,  FREET4, T3FREE, THYROIDAB in the last 72 hours. Anemia Panel: No results for input(s): VITAMINB12, FOLATE, FERRITIN, TIBC, IRON, RETICCTPCT in the last 72 hours. Urine analysis:    Component Value Date/Time   COLORURINE YELLOW 01/21/2019 1551   APPEARANCEUR HAZY (A) 01/21/2019 1551   LABSPEC 1.013 01/21/2019 1551   PHURINE 5.0 01/21/2019 1551   GLUCOSEU 50 (A) 01/21/2019 1551   HGBUR MODERATE (A) 01/21/2019 1551   BILIRUBINUR NEGATIVE 01/21/2019 1551   KETONESUR 5 (A) 01/21/2019 1551   PROTEINUR >=300 (A) 01/21/2019 1551   NITRITE NEGATIVE 01/21/2019 1551   LEUKOCYTESUR NEGATIVE 01/21/2019 1551   Sepsis Labs: @LABRCNTIP (procalcitonin:4,lacticidven:4)  ) Recent Results (from the past 240 hour(s))  Urine Culture     Status: None   Collection Time: 01/21/19  3:51 PM  Result Value Ref Range Status   Specimen Description URINE, RANDOM  Final   Special Requests NONE  Final   Culture   Final    NO GROWTH Performed at [facility], [place] [address] [place], [place] [address]    Report Status 01/22/2019 FINAL  Final  SARS Coronavirus 2 [facility] order, Performed in [place] hospital lab)     Status: None   Collection Time: 01/21/19  4:24 PM  Result Value Ref Range Status    SARS Coronavirus 2 NEGATIVE NEGATIVE Final    Comment: (NOTE) If result is NEGATIVE SARS-CoV-2 target nucleic acids are NOT DETECTED. The SARS-CoV-2 RNA is generally detectable in upper and lower  respiratory specimens during the acute phase of infection. The lowest  concentration of SARS-CoV-2 viral copies this assay can detect is 250  copies / mL. A negative result does not preclude SARS-CoV-2 infection  and should not be used as the sole basis for treatment or other  patient management decisions.  A negative result may occur with  improper specimen collection / handling, submission of specimen other  than nasopharyngeal swab, presence of viral mutation(s) within the  areas targeted by this assay, and inadequate number of viral copies  (<250 copies / mL). A negative result must be combined with clinical  observations, patient history, and epidemiological information. If result is POSITIVE SARS-CoV-2 target nucleic acids are DETECTED. The SARS-CoV-2 RNA is generally detectable in upper and lower  respiratory specimens dur ing the acute phase of infection.  Positive  results are indicative of active infection with SARS-CoV-2.  Clinical  correlation with patient history and other diagnostic information is  necessary to determine patient infection status.  Positive results do  not rule out bacterial infection or co-infection with other viruses. If result is PRESUMPTIVE POSTIVE SARS-CoV-2 nucleic acids MAY BE PRESENT.   A presumptive positive result was obtained on the submitted specimen  and confirmed on repeat testing.  While 2019 novel coronavirus  (SARS-CoV-2) nucleic acids may be present in the submitted sample  additional confirmatory testing may be necessary for epidemiological  and / or clinical management purposes  to differentiate between  SARS-CoV-2 and other Sarbecovirus currently known to infect humans.  If clinically indicated additional testing with an alternate test   methodology [id number]) is advised. The SARS-CoV-2 RNA is generally  detectable in upper and lower respiratory sp ecimens during the acute  phase of infection. The expected result is Negative. Fact Sheet for Patients:  StrictlyIdeas.no Fact Sheet for Healthcare Providers: BankingDealers.co.za This test is not yet approved or cleared by the [place] FDA and has been authorized for detection and/or diagnosis of SARS-CoV-2 by FDA under an Emergency  Use Authorization (EUA).  This EUA will remain in effect (meaning this test can be used) for the duration of the COVID-19 declaration under Section 564(b)(1) of the Act, 21 U.S.C. section 360bbb-3(b)(1), unless the authorization is terminated or revoked sooner. Performed at [facility], [place] [address] [place], [place] [address]       Radiology Studies: Dg Chest 1 View  Result Date: 01/23/2019 CLINICAL DATA:  New right-sided dialysis catheter. EXAM: CHEST  1 VIEW COMPARISON:  Chest x-ray dated Jan 21, 2019. FINDINGS: Interval placement of a tunneled right internal jugular dialysis catheter with the tip in the proximal right atrium. The heart size and mediastinal contours are within normal limits. Normal pulmonary vascularity. Low lung volumes. No focal consolidation, pleural effusion, or pneumothorax. No acute osseous abnormality. IMPRESSION: 1. New tunneled right internal jugular dialysis catheter without complicating feature. Electronically Signed   By: Titus Dubin M.D.   On: 01/23/2019 15:25   Dg Fluoro Guide Cv Line-no Report  Result Date: 01/23/2019 Fluoroscopy was utilized by the requesting physician.  No radiographic interpretation.     Scheduled Meds: . allopurinol  100 mg Oral Daily  . aspirin EC  81 mg Oral Daily  . atorvastatin  20 mg Oral Daily  . calcitRIOL  0.25 mcg Oral Daily  . Chlorhexidine Gluconate Cloth  6 each Topical Q0600  . gabapentin  300 mg Oral Q1500   . heparin  5,000 Units Subcutaneous Q8H  . insulin aspart  0-9 Units Subcutaneous TID WC  . levothyroxine  150 mcg Oral Q0600  . multivitamin  1 tablet Oral Daily  . sodium bicarbonate  650 mg Oral BID  . tamsulosin  0.4 mg Oral Daily   Continuous Infusions:   LOS: 2 days   Time Spent in minutes   30 minutes   Kae Lauman D.O. on 01/24/2019 at 8:19 AM  Between 7am to 7pm - Please see pager noted on [email or web address]  After 7pm go to [email or web address]  And look for the night coverage person covering for me after hours  [facility] Office  [phone]

## 2019-01-24 NOTE — Progress Notes (Signed)
[facility] Progress Note   Subjective:   AVF clotted, had TDC placed 5/15 for access and underwent successful HD.  Plan for declot of AVF soon.  Feeling well, no new complaints.    Objective Vitals:   01/23/19 2153 01/23/19 2200 01/23/19 2213 01/24/19 0620  BP: (!) 116/57 (!) 126/52 134/60 123/68  Pulse: 91 94 88 67  Resp: 18  16 20   Temp: 98.2 F (36.8 C)  98.7 F (37.1 C) 97.8 F (36.6 C)  TempSrc: Oral  Oral Oral  SpO2: 99%  100% 98%  Weight:    77.8 kg  Height:       Physical Exam General: alert in bed, no distress Heart: RRR, no rub Lungs:clear  Abdomen: soft Extremities: no edema Dialysis Access:  LUE AVF without thrill or bruit, RIJ TDC c/d/i  Additional Objective Labs: Basic Metabolic Panel: Recent Labs  Lab 01/22/19 0541 01/23/19 0349 01/24/19 0401  NA 144 141 143  K 3.0* 3.1* 3.6  CL 103 104 106  CO2 23 24 24   GLUCOSE 142* 192* 182*  BUN 123* 114* 79*  CREATININE 6.49* 6.25* 4.58*  CALCIUM 8.6* 8.0* 8.0*  PHOS  --  5.0* 4.1   Liver Function Tests: Recent Labs  Lab 01/22/19 0541 01/23/19 0349 01/24/19 0401  AST 48*  --   --   ALT 27  --   --   ALKPHOS 68  --   --   BILITOT 0.5  --   --   PROT 6.4*  --   --   ALBUMIN 3.0* 2.8* 2.8*   No results for input(s): LIPASE, AMYLASE in the last 168 hours. CBC: Recent Labs  Lab 01/21/19 1501 01/21/19 2304 01/22/19 0541 01/23/19 0349 01/24/19 0401  WBC 10.0 8.9 7.8 9.7 10.2  HGB 13.0 12.0* 11.6* 11.0* 10.9*  HCT 39.5 35.7* 34.1* 32.8* 32.8*  MCV 94.3 92.5 91.7 93.2 94.0  PLT 198 175 169 168 144*   Blood Culture    Component Value Date/Time   SDES URINE, RANDOM 01/21/2019 1551   SPECREQUEST NONE 01/21/2019 1551   CULT  01/21/2019 1551    NO GROWTH Performed at [facility], [place] [address], [place], [place] [address]    REPTSTATUS 01/22/2019 FINAL 01/21/2019 1551    Cardiac Enzymes: Recent Labs  Lab 01/21/19 1501 01/21/19 1841 01/21/19 2304 01/22/19 0541  01/22/19 1044  TROPONINI 0.07* 0.08* 0.09* 0.10* 0.09*   CBG: Recent Labs  Lab 01/23/19 1148 01/23/19 1431 01/23/19 1625 01/23/19 2226 01/24/19 0620  GLUCAP 141* 145* 140* 282* 116*   Iron Studies: No results for input(s): IRON, TIBC, TRANSFERRIN, FERRITIN in the last 72 hours. @lablastinr3 @ Studies/Results: Dg Chest 1 View  Result Date: 01/23/2019 CLINICAL DATA:  New right-sided dialysis catheter. EXAM: CHEST  1 VIEW COMPARISON:  Chest x-ray dated Jan 21, 2019. FINDINGS: Interval placement of a tunneled right internal jugular dialysis catheter with the tip in the proximal right atrium. The heart size and mediastinal contours are within normal limits. Normal pulmonary vascularity. Low lung volumes. No focal consolidation, pleural effusion, or pneumothorax. No acute osseous abnormality. IMPRESSION: 1. New tunneled right internal jugular dialysis catheter without complicating feature. Electronically Signed   By: Titus Dubin M.D.   On: 01/23/2019 15:25   Dg Fluoro Guide Cv Line-no Report  Result Date: 01/23/2019 Fluoroscopy was utilized by the requesting physician.  No radiographic interpretation.   Medications:  . allopurinol  100 mg Oral Daily  . aspirin EC  81  mg Oral Daily  . atorvastatin  20 mg Oral Daily  . calcitRIOL  0.25 mcg Oral Daily  . Chlorhexidine Gluconate Cloth  6 each Topical Q0600  . gabapentin  300 mg Oral Q1500  . heparin  5,000 Units Subcutaneous Q8H  . insulin aspart  0-9 Units Subcutaneous TID WC  . levothyroxine  150 mcg Oral Q0600  . multivitamin  1 tablet Oral Daily  . sodium bicarbonate  650 mg Oral BID  . tamsulosin  0.4 mg Oral Daily    Assessment/Plan **CKD 5 now with uremic symptoms: initiating dialysis - 1st tx yesterday, 2nd today.  AVF clotted and will have declot prior to discharge; [facility] placed yesterday for access. CLIP TTS [place], 1st tx 01/27/19.  Discharge after declot.  Lab holiday tomorrow from my perspective.  **BMM: on  calcitriol 0.25 daily.  Cont.  Ca ok. Phos 5. Not on binder.   **metabolic acidosis: stop po bicarb now that dialyzing  **Anemia of CKD: mild, no indications for therapy at this time, monitor.   **near syncope:  TTE pending, trop cycled ~negative, no CP. On tele. Appears well currently.   **DM: on insulin, per primary  **hypothyroidism: on home synthroid.   Jannifer Hick MD 01/24/2019, 8:19 AM  [facility] Pager: [phone]

## 2019-01-24 NOTE — Progress Notes (Signed)
Will attempt thrombectomy of AV fistula on Tuesday Please make sure he has HD on Monday to avoid schedule conflict  Ruta Hinds, MD [facility]: [phone] Pager: [phone]

## 2019-01-24 NOTE — Progress Notes (Signed)
Spoke with Dr Oneida Alar from Vascular surgery, concerning patient been NPO for possible fistulagram, according to him, patient is not having surgery today.   RN will feed patient breakfast.

## 2019-01-24 NOTE — Anesthesia Postprocedure Evaluation (Signed)
Anesthesia Post Note  Patient: Frank [name]  Procedure(s) Performed: INSERTION OF DIALYSIS CATHETER (N/A )     Patient location during evaluation: PACU Anesthesia Type: General Level of consciousness: awake and alert Pain management: pain level controlled Vital Signs Assessment: post-procedure vital signs reviewed and stable Respiratory status: spontaneous breathing, nonlabored ventilation, respiratory function stable and patient connected to nasal cannula oxygen Cardiovascular status: blood pressure returned to baseline and stable Postop Assessment: no apparent nausea or vomiting Anesthetic complications: no    Last Vitals:  Vitals:   01/24/19 1559 01/24/19 2116  BP: 121/73 (!) 153/74  Pulse: 77 78  Resp: 16 16  Temp: 36.8 C 36.9 C  SpO2: 98% 100%    Last Pain:  Vitals:   01/24/19 2116  TempSrc: Oral  PainSc:                  [name],Eldrige COKER

## 2019-01-24 NOTE — Progress Notes (Signed)
Patient complains of difficulty/painful urination. Bladder scan reveals 770ml. MD paged.

## 2019-01-25 ENCOUNTER — Encounter (HOSPITAL_COMMUNITY): Payer: Self-pay | Admitting: Vascular Surgery

## 2019-01-25 LAB — GLUCOSE, CAPILLARY
Glucose-Capillary: 139 mg/dL — ABNORMAL HIGH (ref 70–99)
Glucose-Capillary: 282 mg/dL — ABNORMAL HIGH (ref 70–99)
Glucose-Capillary: 118 mg/dL — ABNORMAL HIGH (ref 70–99)
Glucose-Capillary: 184 mg/dL — ABNORMAL HIGH (ref 70–99)

## 2019-01-25 LAB — CBC
HCT: 34.5 % — ABNORMAL LOW (ref 39.0–52.0)
Hemoglobin: 11.2 g/dL — ABNORMAL LOW (ref 13.0–17.0)
MCH: 31.1 pg (ref 26.0–34.0)
MCHC: 32.5 g/dL (ref 30.0–36.0)
MCV: 95.8 fL (ref 80.0–100.0)
Platelets: 136 10*3/uL — ABNORMAL LOW (ref 150–400)
RBC: 3.6 MIL/uL — ABNORMAL LOW (ref 4.22–5.81)
RDW: 14.4 % (ref 11.5–15.5)
WBC: 10.9 10*3/uL — ABNORMAL HIGH (ref 4.0–10.5)
nRBC: 0 % (ref 0.0–0.2)

## 2019-01-25 LAB — MAGNESIUM: Magnesium: 1.8 mg/dL (ref 1.7–2.4)

## 2019-01-25 LAB — RENAL FUNCTION PANEL
Albumin: 2.9 g/dL — ABNORMAL LOW (ref 3.5–5.0)
Anion gap: 13 (ref 5–15)
BUN: 48 mg/dL — ABNORMAL HIGH (ref 8–23)
CO2: 26 mmol/L (ref 22–32)
Calcium: 8.4 mg/dL — ABNORMAL LOW (ref 8.9–10.3)
Chloride: 102 mmol/L (ref 98–111)
Creatinine, Ser: 3.94 mg/dL — ABNORMAL HIGH (ref 0.61–1.24)
GFR calc Af Amer: 16 mL/min — ABNORMAL LOW (ref >60)
GFR calc non Af Amer: 14 mL/min — ABNORMAL LOW (ref >60)
Glucose, Bld: 141 mg/dL — ABNORMAL HIGH (ref 70–99)
Phosphorus: 3.9 mg/dL (ref 2.5–4.6)
Potassium: 3.5 mmol/L (ref 3.5–5.1)
Sodium: 141 mmol/L (ref 135–145)

## 2019-01-25 MED ORDER — POTASSIUM CHLORIDE CRYS ER 20 MEQ PO TBCR
40.0000 meq | EXTENDED_RELEASE_TABLET | Freq: Once | ORAL | Status: AC
  Administered 2019-01-25: 40 meq via ORAL
  Filled 2019-01-25: qty 2

## 2019-01-25 NOTE — Progress Notes (Signed)
In and out cath 745ml of urine. Post cath 37cc. Patient expressed relief.

## 2019-01-25 NOTE — Progress Notes (Signed)
PROGRESS NOTE    Frank [name]  LNL:[id number] DOB: Nov 09, 1940 DOA: 01/21/2019 PCP: Chester Holstein, MD   Brief Narrative:  HPI On 01/21/2019 by Dr. Gean Birchwood Frank [name] is a 78 y.o. male with history of chronic kidney disease stage V with left arm AV fistula follows with Dr. Justin Mend nephrologist per patient's daughter who provided history to the ER physician has been having intermittent nausea vomiting last few days and had followed up with his primary care physician who drew labs and found his creatinine be significantly elevated.  The labs available in care everywhere shows creatinine of 4.8 in January 2020.  In addition patient has been having some near syncopal episodes since this morning.  His vomiting is stopped denies any diarrhea chest pain shortness of breath.  Interim history Admitted with near syncopal event.  Also found to have progressive kidney disease and started on dialysis.  However now having clots from fistula.  Vascular surgery consulted, pending fistulogram. Assessment & Plan   Near Syncope -Patient feels he was dehydrated-as he states he had vomiting prior to admission -Denies any current dizziness -COVID negative -Orthostatic vitals positive drop from supine to standing -Echocardiogram EF 60 to 65%.  LV diastolic parameters consistent with impaired relaxation. -PT consulted no further therapy needed.  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 Justin Mend, nephrology -Per Care everywhere, in January creatinine was 4.82 -Creatinine peaked to 6.49 -Patient is able to produce urine but states it has painful at times and dark in color -had had forgetfulness as well as is her nausea and dysphagia -Nephrology consulted and  appreciated. Discussed with Dr. Johnney Ou- patient will start dialysis today, have HD session on 5/15, and one on 5/16.  -patient does have a LUE AVF in place, but possibly not functional- vascular surgery consulted and appreciated -Was scheduled for fistula declot on 01/23/2019 however this did get pushed back.  Patient did receive ultrasound-guided insertion of palindrome catheter. Planning for thrombectomy of AVF on 01/27/2019 -dialysis started -Creatinine currently 3.94 -Of note, patient does have a HD slot at [facility] on TTS at 10:15 AM with report to clinic at 9:30 AM on the first day  Hypokalemia -Resolved, continue to monitor BMP  Hypothyroidism -Continue Synthroid  Diabetes mellitus, type II -Appears to be diet controlled as patient is not on any home medications -Last hemoglobin A1c was 5.9 on 06/04/2018  Chronic pain -Continue home regimen  BPH/ urinary retention -Continue Flomax -patient required in/out catheter yesterday evening- will continue to monitor   DVT Prophylaxis  heparin  Code Status: Full  Family Communication: None at bedside.    Disposition Plan: Admitted. Pending further recommendations from nephrology and vascular surgery.  Suspect home upon discharge.   Consultants Nephrology Vascular surgery  Procedures  Echocardiogram  Antibiotics   Anti-infectives (From admission, onward)   None      Subjective:   Frank [name] seen and examined today.  States he is feeling better today.  Was able to sleep well yesterday.  Denies current chest pain, shortness of breath, abdominal pain, nausea or vomiting, diarrhea or constipation, dizziness or headache.  Patient did state he felt a lot of relief yesterday evening after having the urine catheter placed.  Objective:   Vitals:   01/24/19 1456 01/24/19 1559  01/24/19 2116 01/25/19 0620  BP: (!) 133/59 121/73 (!) 153/74 107/68  Pulse: 78 77 78 73  Resp: 20 16 16 18   Temp: 97.9 F (36.6  C) 98.3 F (36.8 C) 98.5 F (36.9 C) 98 F (36.7 C)  TempSrc: Oral Oral Oral Oral  SpO2: 96% 98% 100% 97%  Weight: 85.7 kg   84.2 kg  Height:        Intake/Output Summary (Last 24 hours) at 01/25/2019 0825 Last data filed at 01/25/2019 2778 Gross per 24 hour  Intake 720 ml  Output 925 ml  Net -205 ml   Filed Weights   01/24/19 1216 01/24/19 1456 01/25/19 0620  Weight: 86.2 kg 85.7 kg 84.2 kg   Exam  General: Well developed, well nourished, NAD, appears stated age  HEENT: NCAT, mucous membranes moist.   Neck: Supple, right catheter in place  Cardiovascular: S1 S2 auscultated, RRR  Respiratory: Clear to auscultation bilaterally with equal chest rise  Abdomen: Soft, nontender, nondistended, + bowel sounds  Extremities: warm dry without cyanosis clubbing or edema.  LUE AVF  Neuro: AAOx3, nonfocal  Psych: Pleasant, appropriate mood and affect  Data Reviewed: I have personally reviewed following labs and imaging studies  CBC: Recent Labs  Lab 01/21/19 2304 01/22/19 0541 01/23/19 0349 01/24/19 0401 01/25/19 0525  WBC 8.9 7.8 9.7 10.2 10.9*  HGB 12.0* 11.6* 11.0* 10.9* 11.2*  HCT 35.7* 34.1* 32.8* 32.8* 34.5*  MCV 92.5 91.7 93.2 94.0 95.8  PLT 175 169 168 144* 242*   Basic Metabolic Panel: Recent Labs  Lab 01/21/19 1501 01/21/19 2304 01/22/19 0541 01/22/19 1044 01/23/19 0349 01/24/19 0401 01/25/19 0525  NA 137  --  144  --  141 143 141  K 3.2*  --  3.0*  --  3.1* 3.6 3.5  CL 100  --  103  --  104 106 102  CO2 18*  --  23  --  24 24 26   GLUCOSE 155*  --  142*  --  192* 182* 141*  BUN 124*  --  123*  --  114* 79* 48*  CREATININE 6.52* 6.48* 6.49*  --  6.25* 4.58* 3.94*  CALCIUM 8.8*  --  8.6*  --  8.0* 8.0* 8.4*  MG  --   --   --  1.9  --   --   --   PHOS  --   --   --   --  5.0* 4.1 3.9   GFR: Estimated Creatinine Clearance: 16.6 mL/min (A) (by C-G formula based on SCr of 3.94 mg/dL (H)). Liver Function Tests: Recent Labs  Lab 01/22/19 0541  01/23/19 0349 01/24/19 0401 01/25/19 0525  AST 48*  --   --   --   ALT 27  --   --   --   ALKPHOS 68  --   --   --   BILITOT 0.5  --   --   --   PROT 6.4*  --   --   --   ALBUMIN 3.0* 2.8* 2.8* 2.9*   No results for input(s): LIPASE, AMYLASE in the last 168 hours. No results for input(s): AMMONIA in the last 168 hours. Coagulation Profile: No results for input(s): INR, PROTIME in the last 168 hours. Cardiac Enzymes: Recent Labs  Lab 01/21/19 1501 01/21/19 1841 01/21/19 2304 01/22/19 0541 01/22/19 1044  TROPONINI 0.07* 0.08* 0.09* 0.10* 0.09*   BNP (last 3 results) No results for input(s): PROBNP in the last 8760 hours. HbA1C: No  results for input(s): HGBA1C in the last 72 hours. CBG: Recent Labs  Lab 01/24/19 0620 01/24/19 1109 01/24/19 1601 01/24/19 2117 01/25/19 0621  GLUCAP 116* 255* 177* 189* 139*   Lipid Profile: No results for input(s): CHOL, HDL, LDLCALC, TRIG, CHOLHDL, LDLDIRECT in the last 72 hours. Thyroid Function Tests: No results for input(s): TSH, T4TOTAL, FREET4, T3FREE, THYROIDAB in the last 72 hours. Anemia Panel: No results for input(s): VITAMINB12, FOLATE, FERRITIN, TIBC, IRON, RETICCTPCT in the last 72 hours. Urine analysis:    Component Value Date/Time   COLORURINE YELLOW 01/21/2019 1551   APPEARANCEUR HAZY (A) 01/21/2019 1551   LABSPEC 1.013 01/21/2019 1551   PHURINE 5.0 01/21/2019 1551   GLUCOSEU 50 (A) 01/21/2019 1551   HGBUR MODERATE (A) 01/21/2019 1551   BILIRUBINUR NEGATIVE 01/21/2019 1551   KETONESUR 5 (A) 01/21/2019 1551   PROTEINUR >=300 (A) 01/21/2019 1551   NITRITE NEGATIVE 01/21/2019 1551   LEUKOCYTESUR NEGATIVE 01/21/2019 1551   Sepsis Labs: @LABRCNTIP (procalcitonin:4,lacticidven:4)  ) Recent Results (from the past 240 hour(s))  Urine Culture     Status: None   Collection Time: 01/21/19  3:51 PM  Result Value Ref Range Status   Specimen Description URINE, RANDOM  Final   Special Requests NONE  Final   Culture    Final    NO GROWTH Performed at [facility], [place] [address], [place], [place] [address]    Report Status 01/22/2019 FINAL  Final  SARS Coronavirus 2 [facility] order, Performed in [place] hospital lab)     Status: None   Collection Time: 01/21/19  4:24 PM  Result Value Ref Range Status   SARS Coronavirus 2 NEGATIVE NEGATIVE Final    Comment: (NOTE) If result is NEGATIVE SARS-CoV-2 target nucleic acids are NOT DETECTED. The SARS-CoV-2 RNA is generally detectable in upper and lower  respiratory specimens during the acute phase of infection. The lowest  concentration of SARS-CoV-2 viral copies this assay can detect is 250  copies / mL. A negative result does not preclude SARS-CoV-2 infection  and should not be used as the sole basis for treatment or other  patient management decisions.  A negative result may occur with  improper specimen collection / handling, submission of specimen other  than nasopharyngeal swab, presence of viral mutation(s) within the  areas targeted by this assay, and inadequate number of viral copies  (<250 copies / mL). A negative result must be combined with clinical  observations, patient history, and epidemiological information. If result is POSITIVE SARS-CoV-2 target nucleic acids are DETECTED. The SARS-CoV-2 RNA is generally detectable in upper and lower  respiratory specimens dur ing the acute phase of infection.  Positive  results are indicative of active infection with SARS-CoV-2.  Clinical  correlation with patient history and other diagnostic information is  necessary to determine patient infection status.  Positive results do  not rule out bacterial infection or co-infection with other viruses. If result is PRESUMPTIVE POSTIVE SARS-CoV-2 nucleic acids MAY BE PRESENT.   A presumptive positive result was obtained on the submitted specimen  and confirmed on repeat testing.  While 2019 novel coronavirus  (SARS-CoV-2) nucleic acids may be  present in the submitted sample  additional confirmatory testing may be necessary for epidemiological  and / or clinical management purposes  to differentiate between  SARS-CoV-2 and other Sarbecovirus currently known to infect humans.  If clinically indicated additional testing with an alternate test  methodology [phone]) is advised. The SARS-CoV-2 RNA is generally  detectable  in upper and lower respiratory sp ecimens during the acute  phase of infection. The expected result is Negative. Fact Sheet for Patients:  StrictlyIdeas.no Fact Sheet for Healthcare Providers: BankingDealers.co.za This test is not yet approved or cleared by the [place] FDA and has been authorized for detection and/or diagnosis of SARS-CoV-2 by FDA under an Emergency Use Authorization (EUA).  This EUA will remain in effect (meaning this test can be used) for the duration of the COVID-19 declaration under Section 564(b)(1) of the Act, 21 U.S.C. section 360bbb-3(b)(1), unless the authorization is terminated or revoked sooner. Performed at [facility], [place] [address], [place], [place] [address]   MRSA PCR Screening     Status: None   Collection Time: 01/24/19 10:10 PM  Result Value Ref Range Status   MRSA by PCR NEGATIVE NEGATIVE Final    Comment:        The GeneXpert MRSA Assay (FDA approved for NASAL specimens only), is one component of a comprehensive MRSA colonization surveillance program. It is not intended to diagnose MRSA infection nor to guide or monitor treatment for MRSA infections. Performed at [facility], [place] [address], [place], [place] [address]       Radiology Studies: Dg Chest 1 View  Result Date: 01/23/2019 CLINICAL DATA:  New right-sided dialysis catheter. EXAM: CHEST  1 VIEW COMPARISON:  Chest x-ray dated Jan 21, 2019. FINDINGS: Interval placement of a tunneled right internal jugular dialysis catheter with the tip in  the proximal right atrium. The heart size and mediastinal contours are within normal limits. Normal pulmonary vascularity. Low lung volumes. No focal consolidation, pleural effusion, or pneumothorax. No acute osseous abnormality. IMPRESSION: 1. New tunneled right internal jugular dialysis catheter without complicating feature. Electronically Signed   By: Titus Dubin M.D.   On: 01/23/2019 15:25   Dg Fluoro Guide Cv Line-no Report  Result Date: 01/23/2019 Fluoroscopy was utilized by the requesting physician.  No radiographic interpretation.     Scheduled Meds: . allopurinol  100 mg Oral Daily  . aspirin EC  81 mg Oral Daily  . atorvastatin  20 mg Oral Daily  . calcitRIOL  0.25 mcg Oral Daily  . Chlorhexidine Gluconate Cloth  6 each Topical Q0600  . gabapentin  300 mg Oral Q1500  . heparin  5,000 Units Subcutaneous Q8H  . insulin aspart  0-9 Units Subcutaneous TID WC  . levothyroxine  150 mcg Oral Q0600  . multivitamin  1 tablet Oral Daily  . tamsulosin  0.4 mg Oral Daily   Continuous Infusions:   LOS: 3 days   Time Spent in minutes   30 minutes   Ardean Melroy D.O. on 01/25/2019 at 8:25 AM  Between 7am to 7pm - Please see pager noted on [email or web address]  After 7pm go to [email or web address]  And look for the night coverage person covering for me after hours  [facility] Office  [phone]

## 2019-01-25 NOTE — Progress Notes (Signed)
Around 1030 this am patient had 40 sec of Bijemini , pt. Asymptomatic, MD notified see epic for new order. Will continue to monitor the patient.

## 2019-01-25 NOTE — Progress Notes (Signed)
[facility] Progress Note   Subjective:   AVF clotted, had TDC placed 5/15 for access and underwent successful HD x 2.  Had HA during tx yesterday which he attributes to cold air from vent above bed (he seems quite sensitive to cold temp; always asking for room temp to be turned up - says always like that).  Plan for declot of AVF Tues.  Feeling well, no new complaints.    Objective Vitals:   01/24/19 1456 01/24/19 1559 01/24/19 2116 01/25/19 0620  BP: (!) 133/59 121/73 (!) 153/74 107/68  Pulse: 78 77 78 73  Resp: 20 16 16 18   Temp: 97.9 F (36.6 C) 98.3 F (36.8 C) 98.5 F (36.9 C) 98 F (36.7 C)  TempSrc: Oral Oral Oral Oral  SpO2: 96% 98% 100% 97%  Weight: 85.7 kg   84.2 kg  Height:       Physical Exam General: alert in bed, no distress Heart: RRR, no rub Lungs:clear  Abdomen: soft Extremities: no edema Dialysis Access:  LUE AVF without thrill or bruit, RIJ TDC c/d/i  Additional Objective Labs: Basic Metabolic Panel: Recent Labs  Lab 01/23/19 0349 01/24/19 0401 01/25/19 0525  NA 141 143 141  K 3.1* 3.6 3.5  CL 104 106 102  CO2 24 24 26   GLUCOSE 192* 182* 141*  BUN 114* 79* 48*  CREATININE 6.25* 4.58* 3.94*  CALCIUM 8.0* 8.0* 8.4*  PHOS 5.0* 4.1 3.9   Liver Function Tests: Recent Labs  Lab 01/22/19 0541 01/23/19 0349 01/24/19 0401 01/25/19 0525  AST 48*  --   --   --   ALT 27  --   --   --   ALKPHOS 68  --   --   --   BILITOT 0.5  --   --   --   PROT 6.4*  --   --   --   ALBUMIN 3.0* 2.8* 2.8* 2.9*   No results for input(s): LIPASE, AMYLASE in the last 168 hours. CBC: Recent Labs  Lab 01/21/19 2304 01/22/19 0541 01/23/19 0349 01/24/19 0401 01/25/19 0525  WBC 8.9 7.8 9.7 10.2 10.9*  HGB 12.0* 11.6* 11.0* 10.9* 11.2*  HCT 35.7* 34.1* 32.8* 32.8* 34.5*  MCV 92.5 91.7 93.2 94.0 95.8  PLT 175 169 168 144* 136*   Blood Culture    Component Value Date/Time   SDES URINE, RANDOM 01/21/2019 1551   SPECREQUEST NONE 01/21/2019  1551   CULT  01/21/2019 1551    NO GROWTH Performed at [facility], [place] [address] [place], [place] [address]    REPTSTATUS 01/22/2019 FINAL 01/21/2019 1551    Cardiac Enzymes: Recent Labs  Lab 01/21/19 1501 01/21/19 1841 01/21/19 2304 01/22/19 0541 01/22/19 1044  TROPONINI 0.07* 0.08* 0.09* 0.10* 0.09*   CBG: Recent Labs  Lab 01/24/19 0620 01/24/19 1109 01/24/19 1601 01/24/19 2117 01/25/19 0621  GLUCAP 116* 255* 177* 189* 139*   Iron Studies: No results for input(s): IRON, TIBC, TRANSFERRIN, FERRITIN in the last 72 hours. @lablastinr3 @ Studies/Results: Dg Chest 1 View  Result Date: 01/23/2019 CLINICAL DATA:  New right-sided dialysis catheter. EXAM: CHEST  1 VIEW COMPARISON:  Chest x-ray dated Jan 21, 2019. FINDINGS: Interval placement of a tunneled right internal jugular dialysis catheter with the tip in the proximal right atrium. The heart size and mediastinal contours are within normal limits. Normal pulmonary vascularity. Low lung volumes. No focal consolidation, pleural effusion, or pneumothorax. No acute osseous abnormality. IMPRESSION: 1. New tunneled right internal jugular  dialysis catheter without complicating feature. Electronically Signed   By: Titus Dubin M.D.   On: 01/23/2019 15:25   Dg Fluoro Guide Cv Line-no Report  Result Date: 01/23/2019 Fluoroscopy was utilized by the requesting physician.  No radiographic interpretation.   Medications:  . allopurinol  100 mg Oral Daily  . aspirin EC  81 mg Oral Daily  . atorvastatin  20 mg Oral Daily  . calcitRIOL  0.25 mcg Oral Daily  . Chlorhexidine Gluconate Cloth  6 each Topical Q0600  . gabapentin  300 mg Oral Q1500  . heparin  5,000 Units Subcutaneous Q8H  . insulin aspart  0-9 Units Subcutaneous TID WC  . levothyroxine  150 mcg Oral Q0600  . multivitamin  1 tablet Oral Daily  . tamsulosin  0.4 mg Oral Daily    Assessment/Plan **CKD 5 now with uremic symptoms: initiating dialysis - 1st tx  5/15, 2nd 5/16, next tomorrow 5/18.  AVF clotted and will have declot prior to discharge - scheduled 5/19; TDC placed for access. [place], 1st tx 01/27/19 but he will still be hospitalized at that point, will need to push back.    **BMM: on calcitriol 0.25 daily.  Cont.  Ca ok. Phos 5. Not on binder.   **metabolic acidosis: stopped po bicarb now that dialyzing  **Anemia of CKD: mild, no indications for therapy at this time, monitor.   **near syncope:  TTE 60-65%, mild DD;  trop cycled ~negative, no CP. On tele. Appears well currently.   **DM: on insulin, per primary  **hypothyroidism: on home synthroid.   Frank Hick MD 01/25/2019, 8:56 AM  [facility] Pager: [phone]

## 2019-01-26 ENCOUNTER — Inpatient Hospital Stay (HOSPITAL_COMMUNITY): Payer: Medicare Other

## 2019-01-26 LAB — RENAL FUNCTION PANEL
Albumin: 2.9 g/dL — ABNORMAL LOW (ref 3.5–5.0)
Anion gap: 10 (ref 5–15)
BUN: 62 mg/dL — ABNORMAL HIGH (ref 8–23)
CO2: 24 mmol/L (ref 22–32)
Calcium: 8.3 mg/dL — ABNORMAL LOW (ref 8.9–10.3)
Chloride: 106 mmol/L (ref 98–111)
Creatinine, Ser: 5.07 mg/dL — ABNORMAL HIGH (ref 0.61–1.24)
GFR calc Af Amer: 12 mL/min — ABNORMAL LOW (ref >60)
GFR calc non Af Amer: 10 mL/min — ABNORMAL LOW (ref >60)
Glucose, Bld: 216 mg/dL — ABNORMAL HIGH (ref 70–99)
Phosphorus: 3.4 mg/dL (ref 2.5–4.6)
Potassium: 3.9 mmol/L (ref 3.5–5.1)
Sodium: 140 mmol/L (ref 135–145)

## 2019-01-26 LAB — CBC
HCT: 34.5 % — ABNORMAL LOW (ref 39.0–52.0)
Hemoglobin: 11 g/dL — ABNORMAL LOW (ref 13.0–17.0)
MCH: 30.8 pg (ref 26.0–34.0)
MCHC: 31.9 g/dL (ref 30.0–36.0)
MCV: 96.6 fL (ref 80.0–100.0)
Platelets: 144 10*3/uL — ABNORMAL LOW (ref 150–400)
RBC: 3.57 MIL/uL — ABNORMAL LOW (ref 4.22–5.81)
RDW: 14.6 % (ref 11.5–15.5)
WBC: 10.6 10*3/uL — ABNORMAL HIGH (ref 4.0–10.5)
nRBC: 0 % (ref 0.0–0.2)

## 2019-01-26 LAB — GLUCOSE, CAPILLARY
Glucose-Capillary: 247 mg/dL — ABNORMAL HIGH (ref 70–99)
Glucose-Capillary: 141 mg/dL — ABNORMAL HIGH (ref 70–99)
Glucose-Capillary: 195 mg/dL — ABNORMAL HIGH (ref 70–99)
Glucose-Capillary: 135 mg/dL — ABNORMAL HIGH (ref 70–99)

## 2019-01-26 MED ORDER — SODIUM CHLORIDE 0.9 % IV SOLN: 100.0000 mL | INTRAVENOUS | Status: DC | PRN

## 2019-01-26 MED ORDER — CEFAZOLIN SODIUM-DEXTROSE 2-4 GM/100ML-% IV SOLN
2.0000 g | INTRAVENOUS | Status: AC
  Administered 2019-01-27: 2 g via INTRAVENOUS

## 2019-01-26 MED ORDER — LIDOCAINE-PRILOCAINE 2.5-2.5 % EX CREA: 1.0000 "application " | TOPICAL_CREAM | CUTANEOUS | Status: DC | PRN

## 2019-01-26 MED ORDER — PENTAFLUOROPROP-TETRAFLUOROETH EX AERO: 1.0000 "application " | INHALATION_SPRAY | CUTANEOUS | Status: DC | PRN

## 2019-01-26 MED ORDER — HEPARIN SODIUM (PORCINE) 1000 UNIT/ML DIALYSIS
1000.0000 [IU] | INTRAMUSCULAR | Status: DC | PRN
  Administered 2019-01-26: 3400 [IU] via INTRAVENOUS_CENTRAL
  Filled 2019-01-26: qty 1

## 2019-01-26 MED ORDER — CEFAZOLIN SODIUM-DEXTROSE 1-4 GM/50ML-% IV SOLN: 1.0000 g | INTRAVENOUS | Status: DC

## 2019-01-26 MED ORDER — ALTEPLASE 2 MG IJ SOLR: 2.0000 mg | Freq: Once | INTRAMUSCULAR | Status: DC | PRN

## 2019-01-26 MED ORDER — LIDOCAINE HCL (PF) 1 % IJ SOLN: 5.0000 mL | INTRAMUSCULAR | Status: DC | PRN

## 2019-01-26 MED ORDER — HEPARIN SODIUM (PORCINE) 1000 UNIT/ML IJ SOLN
INTRAMUSCULAR | Status: AC
  Filled 2019-01-26: qty 4

## 2019-01-26 NOTE — Progress Notes (Signed)
In and out cath done 99 ml in the bladder, pt. Denies pain or discomfort. Will continue to monitor the patient.

## 2019-01-26 NOTE — Progress Notes (Signed)
   Plan for left arm fistula thrombectomy with Dr. Oneida Alar tomorrow in OR with possible venogram. NPO past midnight.   Darby Shadwick C. Donzetta Matters, MD [facility] Office: [phone] Pager: [phone]

## 2019-01-26 NOTE — Progress Notes (Addendum)
PROGRESS NOTE    Frank [name]  QXI:[id number] DOB: 14-Mar-1941 DOA: 01/21/2019 PCP: Chester Holstein, MD   Brief Narrative:  HPI On 01/21/2019 by Dr. Gean Birchwood Frank [name] is a 78 y.o. male with history of chronic kidney disease stage V with left arm AV fistula follows with Dr. Justin Mend nephrologist per patient's daughter who provided history to the ER physician has been having intermittent nausea vomiting last few days and had followed up with his primary care physician who drew labs and found his creatinine be significantly elevated.  The labs available in care everywhere shows creatinine of 4.8 in January 2020.  In addition patient has been having some near syncopal episodes since this morning.  His vomiting is stopped denies any diarrhea chest pain shortness of breath.  Interim history Admitted with near syncopal event.  Also found to have progressive kidney disease and started on dialysis.  However now having clots from fistula.  Vascular surgery consulted, pending fistulogram, likely on 01/27/2019. Assessment & Plan   Near Syncope -Patient feels he was dehydrated-as he states he had vomiting prior to admission -Denies any current dizziness -COVID negative -Orthostatic vitals positive drop from supine to standing -Echocardiogram EF 60 to 65%.  LV diastolic parameters consistent with impaired relaxation. -PT consulted no further therapy needed.  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 Justin Mend, nephrology -Per Care everywhere, in January creatinine was 4.82 -Creatinine peaked to 6.49 -Patient is able to produce urine but states it has painful at times and dark in color -had had forgetfulness as well as is her nausea and dysphagia  -Nephrology consulted and appreciated. Discussed with Dr. Johnney Ou- patient will start dialysis today, have HD session on 5/15, and one on 5/16.  -patient does have a LUE AVF in place, but possibly not functional- vascular surgery consulted and appreciated -Was scheduled for fistula declot on 01/23/2019 however this did get pushed back.  Patient did receive ultrasound-guided insertion of palindrome catheter. Planning for thrombectomy of AVF on 01/27/2019 -dialysis started -Creatinine currently 5.07- patient currently in dialysis  -Of note, patient does have a HD slot at [facility] on TTS at 10:15 AM with report to clinic at 9:30 AM on the first day  Hypokalemia -Resolved, continue to monitor BMP  Hypothyroidism -Continue Synthroid  Diabetes mellitus, type II -Appears to be diet controlled as patient is not on any home medications -Last hemoglobin A1c was 5.9 on 06/04/2018  Chronic pain -Continue home regimen  BPH/ urinary retention -Continue Flomax -patient continues to required in/out catheter in the evening -may need foley catheter placed with urology follow up as an outpatient  -will obtain renal US  DVT Prophylaxis  heparin  Code Status: Full  Family Communication: None at bedside.  Daughter via phone.  Disposition Plan: Admitted. Pending further recommendations from nephrology and vascular surgery.  Suspect home upon discharge.   Consultants Nephrology Vascular surgery  Procedures  Echocardiogram  Antibiotics   Anti-infectives (From admission, onward)   Start     Dose/Rate Route Frequency Ordered Stop   01/27/19 1400  ceFAZolin (ANCEF) IVPB 1 g/50 mL premix    Note to Pharmacy:  Send with pt to OR   1 g 100 mL/hr over 30 Minutes Intravenous On call 01/26/19 0754 01/28/19 1400  Subjective:   Frank [name] seen and examined today, in hemodialysis.  Patient states he is feeling better.  States he had to have a Foley catheter inserted last  night due to inability to urinate.  He states he does not have this problem at home because he drinks lots of spring water.  He currently denies chest pain, shortness of breath, abdominal pain, nausea or vomiting, dizziness or headache.  Objective:   Vitals:   01/26/19 0720 01/26/19 0732 01/26/19 0800 01/26/19 0830  BP: 130/66 136/74 94/61 116/66  Pulse: 84 84 87 88  Resp: 19     Temp: 98.3 F (36.8 C)     TempSrc: Oral     SpO2: 95%     Weight: 87.8 kg     Height:        Intake/Output Summary (Last 24 hours) at 01/26/2019 0853 Last data filed at 01/26/2019 0500 Gross per 24 hour  Intake 1080 ml  Output 815 ml  Net 265 ml   Filed Weights   01/24/19 1456 01/25/19 0620 01/26/19 0720  Weight: 85.7 kg 84.2 kg 87.8 kg   Exam  General: Well developed, well nourished, NAD, appears stated age  HEENT: NCAT, mucous membranes moist.   Cardiovascular: S1 S2 auscultated, RRR  Respiratory: Clear to auscultation bilaterally with equal chest rise  Abdomen: Soft, nontender, nondistended, + bowel sounds  Extremities: warm dry without cyanosis clubbing or edema. L AVF  Neuro: AAOx3, nonfocal  Psych: pleasant, appropriate mood and affect   Data Reviewed: I have personally reviewed following labs and imaging studies  CBC: Recent Labs  Lab 01/22/19 0541 01/23/19 0349 01/24/19 0401 01/25/19 0525 01/26/19 0549  WBC 7.8 9.7 10.2 10.9* 10.6*  HGB 11.6* 11.0* 10.9* 11.2* 11.0*  HCT 34.1* 32.8* 32.8* 34.5* 34.5*  MCV 91.7 93.2 94.0 95.8 96.6  PLT 169 168 144* 136* 213*   Basic Metabolic Panel: Recent Labs  Lab 01/22/19 0541 01/22/19 1044 01/23/19 0349 01/24/19 0401 01/25/19 0525 01/26/19 0549  NA 144  --  141 143 141 140  K 3.0*  --  3.1* 3.6 3.5 3.9  CL 103  --  104 106 102 106  CO2 23  --  24 24 26 24   GLUCOSE 142*  --  192* 182* 141* 216*  BUN 123*  --  114* 79* 48* 62*  CREATININE 6.49*  --  6.25* 4.58* 3.94* 5.07*  CALCIUM 8.6*  --  8.0* 8.0* 8.4* 8.3*  MG  --   1.9  --   --  1.8  --   PHOS  --   --  5.0* 4.1 3.9 3.4   GFR: Estimated Creatinine Clearance: 13.2 mL/min (A) (by C-G formula based on SCr of 5.07 mg/dL (H)). Liver Function Tests: Recent Labs  Lab 01/22/19 0541 01/23/19 0349 01/24/19 0401 01/25/19 0525 01/26/19 0549  AST 48*  --   --   --   --   ALT 27  --   --   --   --   ALKPHOS 68  --   --   --   --   BILITOT 0.5  --   --   --   --   PROT 6.4*  --   --   --   --   ALBUMIN 3.0* 2.8* 2.8* 2.9* 2.9*   No results for input(s): LIPASE, AMYLASE in the last 168 hours. No results for input(s): AMMONIA in the last 168 hours. Coagulation Profile: No results for input(s): INR, PROTIME  in the last 168 hours. Cardiac Enzymes: Recent Labs  Lab 01/21/19 1501 01/21/19 1841 01/21/19 2304 01/22/19 0541 01/22/19 1044  TROPONINI 0.07* 0.08* 0.09* 0.10* 0.09*   BNP (last 3 results) No results for input(s): PROBNP in the last 8760 hours. HbA1C: No results for input(s): HGBA1C in the last 72 hours. CBG: Recent Labs  Lab 01/25/19 0621 01/25/19 1133 01/25/19 1603 01/25/19 2118 01/26/19 0619  GLUCAP 139* 282* 118* 184* 247*   Lipid Profile: No results for input(s): CHOL, HDL, LDLCALC, TRIG, CHOLHDL, LDLDIRECT in the last 72 hours. Thyroid Function Tests: No results for input(s): TSH, T4TOTAL, FREET4, T3FREE, THYROIDAB in the last 72 hours. Anemia Panel: No results for input(s): VITAMINB12, FOLATE, FERRITIN, TIBC, IRON, RETICCTPCT in the last 72 hours. Urine analysis:    Component Value Date/Time   COLORURINE YELLOW 01/21/2019 1551   APPEARANCEUR HAZY (A) 01/21/2019 1551   LABSPEC 1.013 01/21/2019 1551   PHURINE 5.0 01/21/2019 1551   GLUCOSEU 50 (A) 01/21/2019 1551   HGBUR MODERATE (A) 01/21/2019 1551   BILIRUBINUR NEGATIVE 01/21/2019 1551   KETONESUR 5 (A) 01/21/2019 1551   PROTEINUR >=300 (A) 01/21/2019 1551   NITRITE NEGATIVE 01/21/2019 1551   LEUKOCYTESUR NEGATIVE 01/21/2019 1551   Sepsis Labs:  @[email or web address] (procalcitonin:4,lacticidven:4)  ) Recent Results (from the past 240 hour(s))  Urine Culture     Status: None   Collection Time: 01/21/19  3:51 PM  Result Value Ref Range Status   Specimen Description URINE, RANDOM  Final   Special Requests NONE  Final   Culture   Final    NO GROWTH Performed at [facility], [place] [address], [place], [place] [address]    Report Status 01/22/2019 FINAL  Final  SARS Coronavirus 2 [facility] order, Performed in [place] hospital lab)     Status: None   Collection Time: 01/21/19  4:24 PM  Result Value Ref Range Status   SARS Coronavirus 2 NEGATIVE NEGATIVE Final    Comment: (NOTE) If result is NEGATIVE SARS-CoV-2 target nucleic acids are NOT DETECTED. The SARS-CoV-2 RNA is generally detectable in upper and lower  respiratory specimens during the acute phase of infection. The lowest  concentration of SARS-CoV-2 viral copies this assay can detect is 250  copies / mL. A negative result does not preclude SARS-CoV-2 infection  and should not be used as the sole basis for treatment or other  patient management decisions.  A negative result may occur with  improper specimen collection / handling, submission of specimen other  than nasopharyngeal swab, presence of viral mutation(s) within the  areas targeted by this assay, and inadequate number of viral copies  (<250 copies / mL). A negative result must be combined with clinical  observations, patient history, and epidemiological information. If result is POSITIVE SARS-CoV-2 target nucleic acids are DETECTED. The SARS-CoV-2 RNA is generally detectable in upper and lower  respiratory specimens dur ing the acute phase of infection.  Positive  results are indicative of active infection with SARS-CoV-2.  Clinical  correlation with patient history and other diagnostic information is  necessary to determine patient infection status.  Positive results do  not rule out bacterial infection or  co-infection with other viruses. If result is PRESUMPTIVE POSTIVE SARS-CoV-2 nucleic acids MAY BE PRESENT.   A presumptive positive result was obtained on the submitted specimen  and confirmed on repeat testing.  While 2019 novel coronavirus  (SARS-CoV-2) nucleic acids may be present in the submitted sample  additional confirmatory testing may be  necessary for epidemiological  and / or clinical management purposes  to differentiate between  SARS-CoV-2 and other Sarbecovirus currently known to infect humans.  If clinically indicated additional testing with an alternate test  methodology [phone]) is advised. The SARS-CoV-2 RNA is generally  detectable in upper and lower respiratory sp ecimens during the acute  phase of infection. The expected result is Negative. Fact Sheet for Patients:  StrictlyIdeas.no Fact Sheet for Healthcare Providers: BankingDealers.co.za This test is not yet approved or cleared by the [place] FDA and has been authorized for detection and/or diagnosis of SARS-CoV-2 by FDA under an Emergency Use Authorization (EUA).  This EUA will remain in effect (meaning this test can be used) for the duration of the COVID-19 declaration under Section 564(b)(1) of the Act, 21 U.S.C. section 360bbb-3(b)(1), unless the authorization is terminated or revoked sooner. Performed at [facility], [place] [address], [place], [place] [address]   MRSA PCR Screening     Status: None   Collection Time: 01/24/19 10:10 PM  Result Value Ref Range Status   MRSA by PCR NEGATIVE NEGATIVE Final    Comment:        The GeneXpert MRSA Assay (FDA approved for NASAL specimens only), is one component of a comprehensive MRSA colonization surveillance program. It is not intended to diagnose MRSA infection nor to guide or monitor treatment for MRSA infections. Performed at [facility], [place] [address], [place], [place] [address]        Radiology Studies: No results found.   Scheduled Meds: . allopurinol  100 mg Oral Daily  . aspirin EC  81 mg Oral Daily  . atorvastatin  20 mg Oral Daily  . calcitRIOL  0.25 mcg Oral Daily  . Chlorhexidine Gluconate Cloth  6 each Topical Q0600  . gabapentin  300 mg Oral Q1500  . heparin  5,000 Units Subcutaneous Q8H  . insulin aspart  0-9 Units Subcutaneous TID WC  . levothyroxine  150 mcg Oral Q0600  . multivitamin  1 tablet Oral Daily  . tamsulosin  0.4 mg Oral Daily   Continuous Infusions: . sodium chloride    . sodium chloride    . [START ON 01/27/2019]  ceFAZolin (ANCEF) IV       LOS: 4 days   Time Spent in minutes   30 minutes   Kenadi Miltner D.O. on 01/26/2019 at 8:53 AM  Between 7am to 7pm - Please see pager noted on [email or web address]  After 7pm go to [email or web address]  And look for the night coverage person covering for me after hours  [facility] Office  [phone]

## 2019-01-26 NOTE — Progress Notes (Addendum)
   Subjective: HD#4   Overnight: Complained of dysuria   Today, Frank [name] was examined in the dialysis unit and reports that he is doing well.  He denies shortness of breath, chest pain, abdominal pain, nausea or vomiting.  He was tolerating dialysis well without any difficulties.  Objective:  Vital signs in last 24 hours: Vitals:   01/26/19 0620 01/26/19 0720 01/26/19 0732 01/26/19 0800  BP: 116/85 130/66 136/74 94/61  Pulse: 96 84 84 87  Resp: 16 19    Temp: 98.3 F (36.8 C) 98.3 F (36.8 C)    TempSrc: Oral Oral    SpO2: 98% 95%    Weight:  87.8 kg    Height:       Const: In NAD, lying in bed comfortably Resp:CTABL, no wheezes, crackles, rhonchi  CV:RRR, no MGR Abd: Soft, NTTP Ext:No edema Skin: AV Fistula at the LUE, Right IJ catheter in place    Assessment/Plan:  Principal Problem:   Near syncope Active Problems:   Chronic pain   CKD (chronic kidney disease), stage IV (HCC)   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 On arrival, he was noted to have worsening renal function and has been initiated on hemodialysis.  1. 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 Labs consistent with Uremia (BUN/Cr 114/6.25). Initiated on HD  First - 5/15, Second - 5/16. He is scheduled to undergo HD today and will undergo declotting as he was noted to have a clotted AVF.   Plan: - Hemodialysis today  - Continue to follow BMP - AVF declotting   2. Metabolic Bone Disease: Ca corrected for albumin 9.2, Phosphorus within normal limit   Plan:  - Continue Calcitriol - Phosphorus within normal limit and will not initiate Sevelamer at least for now  3. Metabolic Acidosis: Resolved  Plan: - Continue to monitor    4. Anemia of CKD: Remains asymptomatic, H/H stable   Plan: - Continue to monitor. No indication of Epo presently   5. Type 2 DM: CBG this am 240s. No evidence of hypoglycemia or overt hyperglycemia since admission  Plan:  -Continue insulin   6. Hypothyroidism:  Plan: -Continue synthroid   7.  Benign prostatic hyperplasia; urinary retention  Plan: - Will likely benefit from Foley catheter placement and follow-up with urology -Continue Flomax  FEN: Replace electrolytes as needed  VTE ppx: SQ heparin  CODE STATUS: FULL   Jean Rosenthal, MD 01/26/2019, 8:27 AM Pager: [phone] IMTS PGY-1

## 2019-01-26 NOTE — Progress Notes (Signed)
Patient C/O painful/inability to urinate. Bladder scan reveal 657ml of urine in bladder. On call MD paged with information.

## 2019-01-27 ENCOUNTER — Inpatient Hospital Stay (HOSPITAL_COMMUNITY): Payer: Medicare Other

## 2019-01-27 ENCOUNTER — Inpatient Hospital Stay (HOSPITAL_COMMUNITY): Payer: Medicare Other | Admitting: Certified Registered Nurse Anesthetist

## 2019-01-27 ENCOUNTER — Encounter (HOSPITAL_COMMUNITY): Payer: Self-pay | Admitting: Orthopedic Surgery

## 2019-01-27 ENCOUNTER — Encounter (HOSPITAL_COMMUNITY)
Admission: EM | Disposition: A | Discharge status: Home / Self Care | Payer: Self-pay | Source: Home / Self Care | Attending: Internal Medicine

## 2019-01-27 ENCOUNTER — Other Ambulatory Visit: Payer: Self-pay

## 2019-01-27 DIAGNOSIS — T82868A Thrombosis of vascular prosthetic devices, implants and grafts, initial encounter: Secondary | ICD-10-CM

## 2019-01-27 DIAGNOSIS — E119 Type 2 diabetes mellitus without complications: Secondary | ICD-10-CM

## 2019-01-27 DIAGNOSIS — N185 Chronic kidney disease, stage 5: Secondary | ICD-10-CM

## 2019-01-27 HISTORY — PX: VENOGRAM: SHX5497

## 2019-01-27 HISTORY — PX: THROMBECTOMY W/ EMBOLECTOMY: SHX2507

## 2019-01-27 LAB — PROTIME-INR
INR: 1.1 (ref 0.8–1.2)
Prothrombin Time: 13.6 seconds (ref 11.4–15.2)

## 2019-01-27 LAB — BASIC METABOLIC PANEL
Anion gap: 10 (ref 5–15)
BUN: 54 mg/dL — ABNORMAL HIGH (ref 8–23)
CO2: 26 mmol/L (ref 22–32)
Calcium: 8.5 mg/dL — ABNORMAL LOW (ref 8.9–10.3)
Chloride: 105 mmol/L (ref 98–111)
Creatinine, Ser: 4.44 mg/dL — ABNORMAL HIGH (ref 0.61–1.24)
GFR calc Af Amer: 14 mL/min — ABNORMAL LOW (ref >60)
GFR calc non Af Amer: 12 mL/min — ABNORMAL LOW (ref >60)
Glucose, Bld: 147 mg/dL — ABNORMAL HIGH (ref 70–99)
Potassium: 4 mmol/L (ref 3.5–5.1)
Sodium: 141 mmol/L (ref 135–145)

## 2019-01-27 LAB — GLUCOSE, CAPILLARY
Glucose-Capillary: 113 mg/dL — ABNORMAL HIGH (ref 70–99)
Glucose-Capillary: 145 mg/dL — ABNORMAL HIGH (ref 70–99)
Glucose-Capillary: 151 mg/dL — ABNORMAL HIGH (ref 70–99)
Glucose-Capillary: 162 mg/dL — ABNORMAL HIGH (ref 70–99)

## 2019-01-27 LAB — CBC
HCT: 32.3 % — ABNORMAL LOW (ref 39.0–52.0)
Hemoglobin: 10.6 g/dL — ABNORMAL LOW (ref 13.0–17.0)
MCH: 31.7 pg (ref 26.0–34.0)
MCHC: 32.8 g/dL (ref 30.0–36.0)
MCV: 96.7 fL (ref 80.0–100.0)
Platelets: 125 10*3/uL — ABNORMAL LOW (ref 150–400)
RBC: 3.34 MIL/uL — ABNORMAL LOW (ref 4.22–5.81)
RDW: 14.9 % (ref 11.5–15.5)
WBC: 9.5 10*3/uL (ref 4.0–10.5)
nRBC: 0 % (ref 0.0–0.2)

## 2019-01-27 LAB — SURGICAL PCR SCREEN
MRSA, PCR: NEGATIVE
Staphylococcus aureus: NEGATIVE

## 2019-01-27 SURGERY — THROMBECTOMY ARTERIOVENOUS FISTULA
Anesthesia: General | Site: Arm Upper | Laterality: Left

## 2019-01-27 MED ORDER — SODIUM CHLORIDE 0.9 % IV SOLN
INTRAVENOUS | Status: DC | PRN
  Administered 2019-01-27: 500 mL

## 2019-01-27 MED ORDER — HEPARIN SODIUM (PORCINE) 1000 UNIT/ML IJ SOLN
INTRAMUSCULAR | Status: AC
  Filled 2019-01-27: qty 1

## 2019-01-27 MED ORDER — LIDOCAINE HCL 1 % IJ SOLN
INTRAMUSCULAR | Status: AC
  Filled 2019-01-27: qty 20

## 2019-01-27 MED ORDER — FENTANYL CITRATE (PF) 250 MCG/5ML IJ SOLN
INTRAMUSCULAR | Status: DC | PRN
  Administered 2019-01-27: 50 ug via INTRAVENOUS
  Administered 2019-01-27: 100 ug via INTRAVENOUS

## 2019-01-27 MED ORDER — ONDANSETRON HCL 4 MG/2ML IJ SOLN
INTRAMUSCULAR | Status: AC
  Filled 2019-01-27: qty 2

## 2019-01-27 MED ORDER — SODIUM CHLORIDE 0.9 % IV SOLN
INTRAVENOUS | Status: DC
  Administered 2019-01-27 (×2): via INTRAVENOUS

## 2019-01-27 MED ORDER — ROCURONIUM BROMIDE 10 MG/ML (PF) SYRINGE
PREFILLED_SYRINGE | INTRAVENOUS | Status: AC
  Filled 2019-01-27: qty 10

## 2019-01-27 MED ORDER — LIDOCAINE 2% (20 MG/ML) 5 ML SYRINGE
INTRAMUSCULAR | Status: DC | PRN
  Administered 2019-01-27: 60 mg via INTRAVENOUS
  Administered 2019-01-27: 40 mg via INTRAVENOUS

## 2019-01-27 MED ORDER — FENTANYL CITRATE (PF) 100 MCG/2ML IJ SOLN
INTRAMUSCULAR | Status: AC
  Filled 2019-01-27: qty 2

## 2019-01-27 MED ORDER — SUGAMMADEX SODIUM 200 MG/2ML IV SOLN
INTRAVENOUS | Status: DC | PRN
  Administered 2019-01-27: 200 mg via INTRAVENOUS

## 2019-01-27 MED ORDER — DEXAMETHASONE SODIUM PHOSPHATE 10 MG/ML IJ SOLN
INTRAMUSCULAR | Status: AC
  Filled 2019-01-27: qty 1

## 2019-01-27 MED ORDER — ROCURONIUM BROMIDE 50 MG/5ML IV SOSY
PREFILLED_SYRINGE | INTRAVENOUS | Status: DC | PRN
  Administered 2019-01-27: 50 mg via INTRAVENOUS

## 2019-01-27 MED ORDER — SODIUM CHLORIDE 0.9 % IV SOLN
INTRAVENOUS | Status: DC | PRN
  Administered 2019-01-27: 50 ug/min via INTRAVENOUS

## 2019-01-27 MED ORDER — PROPOFOL 10 MG/ML IV BOLUS
INTRAVENOUS | Status: DC | PRN
  Administered 2019-01-27: 130 mg via INTRAVENOUS

## 2019-01-27 MED ORDER — HEPARIN SODIUM (PORCINE) 1000 UNIT/ML IJ SOLN
INTRAMUSCULAR | Status: DC | PRN
  Administered 2019-01-27: 7000 [IU] via INTRAVENOUS
  Administered 2019-01-27 (×2): 3000 [IU] via INTRAVENOUS

## 2019-01-27 MED ORDER — CHLORHEXIDINE GLUCONATE CLOTH 2 % EX PADS
6.0000 | MEDICATED_PAD | Freq: Every day | CUTANEOUS | Status: DC
  Administered 2019-01-27 – 2019-01-28 (×2): 6 via TOPICAL

## 2019-01-27 MED ORDER — LIDOCAINE HCL (PF) 1 % IJ SOLN: INTRAMUSCULAR | Status: DC | PRN

## 2019-01-27 MED ORDER — SODIUM CHLORIDE (PF) 0.9 % IJ SOLN: INTRAVENOUS | Status: DC | PRN

## 2019-01-27 MED ORDER — LIDOCAINE 2% (20 MG/ML) 5 ML SYRINGE
INTRAMUSCULAR | Status: AC
  Filled 2019-01-27: qty 5

## 2019-01-27 MED ORDER — MUPIROCIN 2 % EX OINT
1.0000 "application " | TOPICAL_OINTMENT | Freq: Two times a day (BID) | CUTANEOUS | Status: DC
  Administered 2019-01-27 – 2019-01-28 (×3): 1 via NASAL
  Filled 2019-01-27: qty 22

## 2019-01-27 MED ORDER — SUCCINYLCHOLINE CHLORIDE 200 MG/10ML IV SOSY
PREFILLED_SYRINGE | INTRAVENOUS | Status: DC | PRN
  Administered 2019-01-27: 100 mg via INTRAVENOUS

## 2019-01-27 MED ORDER — FENTANYL CITRATE (PF) 100 MCG/2ML IJ SOLN
25.0000 ug | INTRAMUSCULAR | Status: DC | PRN
  Administered 2019-01-27: 25 ug via INTRAVENOUS

## 2019-01-27 MED ORDER — SUCCINYLCHOLINE CHLORIDE 200 MG/10ML IV SOSY
PREFILLED_SYRINGE | INTRAVENOUS | Status: AC
  Filled 2019-01-27: qty 10

## 2019-01-27 MED ORDER — SODIUM CHLORIDE 0.9 % IV SOLN
INTRAVENOUS | Status: AC
  Filled 2019-01-27: qty 1.2

## 2019-01-27 MED ORDER — DEXAMETHASONE SODIUM PHOSPHATE 10 MG/ML IJ SOLN
INTRAMUSCULAR | Status: DC | PRN
  Administered 2019-01-27: 5 mg via INTRAVENOUS

## 2019-01-27 MED ORDER — METOPROLOL TARTRATE 5 MG/5ML IV SOLN
5.0000 mg | INTRAVENOUS | Status: DC | PRN
  Administered 2019-01-27: 5 mg via INTRAVENOUS
  Filled 2019-01-27: qty 5

## 2019-01-27 MED ORDER — PROTAMINE SULFATE 10 MG/ML IV SOLN
INTRAVENOUS | Status: DC | PRN
  Administered 2019-01-27 (×2): 50 mg via INTRAVENOUS

## 2019-01-27 MED ORDER — IODIXANOL 320 MG/ML IV SOLN
INTRAVENOUS | Status: DC | PRN
  Administered 2019-01-27: 17:00:00 40 mL via INTRAVENOUS

## 2019-01-27 MED ORDER — ONDANSETRON HCL 4 MG/2ML IJ SOLN
INTRAMUSCULAR | Status: DC | PRN
  Administered 2019-01-27: 4 mg via INTRAVENOUS

## 2019-01-27 MED ORDER — FENTANYL CITRATE (PF) 250 MCG/5ML IJ SOLN
INTRAMUSCULAR | Status: AC
  Filled 2019-01-27: qty 5

## 2019-01-27 MED ORDER — EPHEDRINE SULFATE 50 MG/ML IJ SOLN
INTRAMUSCULAR | Status: DC | PRN
  Administered 2019-01-27 (×2): 5 mg via INTRAVENOUS

## 2019-01-27 MED ORDER — PHENYLEPHRINE HCL (PRESSORS) 10 MG/ML IV SOLN
INTRAVENOUS | Status: DC | PRN
  Administered 2019-01-27 (×2): 80 ug via INTRAVENOUS

## 2019-01-27 MED ORDER — 0.9 % SODIUM CHLORIDE (POUR BTL) OPTIME
TOPICAL | Status: DC | PRN
  Administered 2019-01-27: 14:00:00 1000 mL

## 2019-01-27 SURGICAL SUPPLY — 58 items
ARMBAND PINK RESTRICT EXTREMIT (MISCELLANEOUS) ×3 IMPLANT
BAG BANDED W/RUBBER/TAPE 36X54 (MISCELLANEOUS) ×1 IMPLANT
BAG SNAP BAND KOVER 36X36 (MISCELLANEOUS) ×1 IMPLANT
CANISTER SUCT 3000ML PPV (MISCELLANEOUS) ×2 IMPLANT
CANNULA VESSEL 3MM 2 BLNT TIP (CANNULA) ×1 IMPLANT
CATH EMB 4FR 80CM (CATHETERS) ×3 IMPLANT
CHLORAPREP W/TINT 26ML (MISCELLANEOUS) ×1 IMPLANT
CLIP VESOCCLUDE MED 6/CT (CLIP) ×2 IMPLANT
CLIP VESOCCLUDE SM WIDE 6/CT (CLIP) ×2 IMPLANT
COVER BACK TABLE 60X90IN (DRAPES) ×1 IMPLANT
COVER DOME SNAP 22 D (MISCELLANEOUS) ×1 IMPLANT
COVER PROBE W GEL 5X96 (DRAPES) ×2 IMPLANT
COVER WAND RF STERILE (DRAPES) ×2 IMPLANT
DECANTER SPIKE VIAL GLASS SM (MISCELLANEOUS) ×2 IMPLANT
DERMABOND ADVANCED (GAUZE/BANDAGES/DRESSINGS) ×1
DERMABOND ADVANCED .7 DNX12 (GAUZE/BANDAGES/DRESSINGS) ×1 IMPLANT
DRAPE C-ARM 42X72 X-RAY (DRAPES) ×1 IMPLANT
DRAPE FEMORAL ANGIO 80X135IN (DRAPES) ×1 IMPLANT
DRAPE X-RAY CASS 24X20 (DRAPES) IMPLANT
ELECT REM PT RETURN 9FT ADLT (ELECTROSURGICAL) ×2
ELECTRODE REM PT RTRN 9FT ADLT (ELECTROSURGICAL) ×1 IMPLANT
GLOVE BIO SURGEON STRL SZ7.5 (GLOVE) ×2 IMPLANT
GLOVE BIOGEL PI IND STRL 7.0 (GLOVE) IMPLANT
GLOVE BIOGEL PI IND STRL 7.5 (GLOVE) IMPLANT
GLOVE BIOGEL PI INDICATOR 7.0 (GLOVE) ×2
GLOVE BIOGEL PI INDICATOR 7.5 (GLOVE) ×1
GLOVE ECLIPSE 7.0 STRL STRAW (GLOVE) ×1 IMPLANT
GOWN STRL REUS W/ TWL LRG LVL3 (GOWN DISPOSABLE) ×3 IMPLANT
GOWN STRL REUS W/TWL LRG LVL3 (GOWN DISPOSABLE) ×3
GRAFT GORETEX STRT 4-7X45 (Vascular Products) ×1 IMPLANT
HEMOSTAT SPONGE AVITENE ULTRA (HEMOSTASIS) IMPLANT
KIT BASIN OR (CUSTOM PROCEDURE TRAY) ×2 IMPLANT
KIT HEART LEFT (KITS) IMPLANT
KIT TURNOVER KIT B (KITS) ×2 IMPLANT
LOOP VESSEL MINI RED (MISCELLANEOUS) IMPLANT
NS IRRIG 1000ML POUR BTL (IV SOLUTION) ×2 IMPLANT
PACK CV ACCESS (CUSTOM PROCEDURE TRAY) ×2 IMPLANT
PAD ARMBOARD 7.5X6 YLW CONV (MISCELLANEOUS) ×4 IMPLANT
PROTECTION STATION PRESSURIZED (MISCELLANEOUS)
SET COLLECT BLD 21X3/4 12 (NEEDLE) IMPLANT
SET MICROPUNCTURE 5F STIFF (MISCELLANEOUS) IMPLANT
SHEATH PINNACLE 5F 10CM (SHEATH) ×1 IMPLANT
STATION PROTECTION PRESSURIZED (MISCELLANEOUS) ×1 IMPLANT
STOPCOCK 4 WAY LG BORE MALE ST (IV SETS) ×1 IMPLANT
STOPCOCK MORSE 400PSI 3WAY (MISCELLANEOUS) ×2 IMPLANT
SUT PROLENE 6 0 CC (SUTURE) ×4 IMPLANT
SUT VIC AB 3-0 SH 27 (SUTURE) ×2
SUT VIC AB 3-0 SH 27X BRD (SUTURE) ×1 IMPLANT
SUT VICRYL 4-0 PS2 18IN ABS (SUTURE) ×2 IMPLANT
SYR 10ML LL (SYRINGE) ×6 IMPLANT
SYRINGE 20CC LL (MISCELLANEOUS) ×1 IMPLANT
TOWEL GREEN STERILE (TOWEL DISPOSABLE) ×2 IMPLANT
TRAY FOLEY MTR SLVR 16FR STAT (SET/KITS/TRAYS/PACK) IMPLANT
TUBING CIL FLEX 10 FLL-RA (TUBING) ×2 IMPLANT
TUBING EXTENTION W/L.L. (IV SETS) IMPLANT
UNDERPAD 30X30 (UNDERPADS AND DIAPERS) ×2 IMPLANT
WATER STERILE IRR 1000ML POUR (IV SOLUTION) ×2 IMPLANT
WIRE BENTSON .035X145CM (WIRE) ×1 IMPLANT

## 2019-01-27 NOTE — Progress Notes (Signed)
[facility] Progress Note   Subjective:     Unfortunately was unable to get dialysis yesterday as IJ catheter did not support dialysis.   Today Frank [name] was pleasant as usual. He states he is doing well and was worried about his blood sugar however was reassured that it was unremarkable. He reports he usually experiences hypoglycemic episodes when his CBG<100. He was however asymptomatic.   Objective Vitals:   01/26/19 1204 01/26/19 1953 01/27/19 0250 01/27/19 0451  BP: 126/72 130/70  (!) 120/51  Pulse: 94 77  70  Resp: 15 16  (!) 24  Temp: 98.6 F (37 C) 98.6 F (37 C)  98 F (36.7 C)  TempSrc: Oral Oral  Oral  SpO2: 98% 98%  98%  Weight:   85.8 kg   Height:       Physical Exam General: In NAD, lying in bed comfortably  Heart: RRR, no MGR Lungs: CTABL, no wheezes, crackles, rhonchi  Abdomen: Soft, non distended, NTTP Extremities: No edema Dialysis Access:  Right IJ catheter   Additional Objective Labs: Basic Metabolic Panel: Recent Labs  Lab 01/24/19 0401 01/25/19 0525 01/26/19 0549 01/27/19 0521  NA 143 141 140 141  K 3.6 3.5 3.9 4.0  CL 106 102 106 105  CO2 24 26 24 26   GLUCOSE 182* 141* 216* 147*  BUN 79* 48* 62* 54*  CREATININE 4.58* 3.94* 5.07* 4.44*  CALCIUM 8.0* 8.4* 8.3* 8.5*  PHOS 4.1 3.9 3.4  --    Liver Function Tests: Recent Labs  Lab 01/22/19 0541  01/24/19 0401 01/25/19 0525 01/26/19 0549  AST 48*  --   --   --   --   ALT 27  --   --   --   --   ALKPHOS 68  --   --   --   --   BILITOT 0.5  --   --   --   --   PROT 6.4*  --   --   --   --   ALBUMIN 3.0*   < > 2.8* 2.9* 2.9*   < > = values in this interval not displayed.   No results for input(s): LIPASE, AMYLASE in the last 168 hours. CBC: Recent Labs  Lab 01/23/19 0349 01/24/19 0401 01/25/19 0525 01/26/19 0549 01/27/19 0521  WBC 9.7 10.2 10.9* 10.6* 9.5  HGB 11.0* 10.9* 11.2* 11.0* 10.6*  HCT 32.8* 32.8* 34.5* 34.5* 32.3*  MCV 93.2 94.0 95.8 96.6 96.7   PLT 168 144* 136* 144* 125*   Blood Culture    Component Value Date/Time   SDES URINE, RANDOM 01/21/2019 1551   SPECREQUEST NONE 01/21/2019 1551   CULT  01/21/2019 1551    NO GROWTH Performed at [facility], [place] [address] [place], [place] [address]    REPTSTATUS 01/22/2019 FINAL 01/21/2019 1551    Cardiac Enzymes: Recent Labs  Lab 01/21/19 1501 01/21/19 1841 01/21/19 2304 01/22/19 0541 01/22/19 1044  TROPONINI 0.07* 0.08* 0.09* 0.10* 0.09*   CBG: Recent Labs  Lab 01/26/19 0619 01/26/19 1201 01/26/19 1654 01/26/19 2109 01/27/19 0550  GLUCAP 247* 141* 195* 135* 113*    Medications: .  ceFAZolin (ANCEF) IV     . allopurinol  100 mg Oral Daily  . aspirin EC  81 mg Oral Daily  . atorvastatin  20 mg Oral Daily  . calcitRIOL  0.25 mcg Oral Daily  . Chlorhexidine Gluconate Cloth  6 each Topical Q0600  . gabapentin  300 mg  Oral Q1500  . heparin  5,000 Units Subcutaneous Q8H  . insulin aspart  0-9 Units Subcutaneous TID WC  . levothyroxine  150 mcg Oral Q0600  . multivitamin  1 tablet Oral Daily  . mupirocin ointment  1 application Nasal BID  . tamsulosin  0.4 mg Oral Daily    Assessment/Plan:   Frank [name] is a 78 year old gentleman with CKD5, T2DM, BPH and hypothyroidism who presented with nausea, non-bilous non bloody emesis and near syncope. On arrival, he was noted to have worsening renal function and has been initiated on hemodialysis.  1. 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 Labs consistent with Uremia (BUN/Cr 114/6.25). Initiated on HD  First (5/15, Second - 5/16). Unfortunately was not able to undergo dialysis yesterday due to IJ catheter not supporting session. His renal function is stable without signs of acidosis. UOP is stable at 815cc in past 24 hours.  - Continue to follow BMP - Left arm fistula thrombectomy today  2. Metabolic Bone Disease: Ca corrected for albumin 9.2, Phosphorus  within normal limit  - Continue Calcitriol  3. Metabolic Acidosis: Resolved - Continue to monitor   4. Anemia of CKD: Remains asymptomatic, H/H stable  - Continue to monitor. No indication of Epo presently   5. Type 2 DM: CBG this am 110s. No evidence of hypoglycemia or overt hyperglycemia since admission -Continue insulin   6. Hypothyroidism: -Continue synthroid   7.  Benign prostatic hyperplasia; urinary retention - Will likely benefit from Foley catheter placement and follow-up with urology - Continue Flomax  FEN: Replace electrolytes as needed  VTE ppx: SQ heparin  CODE STATUS: FULL

## 2019-01-27 NOTE — Progress Notes (Signed)
PROGRESS NOTE    Frank [name]  BMW:[id number] DOB: 10/07/1940 DOA: 01/21/2019 PCP: Chester Holstein, MD   Brief Narrative:  HPI On 01/21/2019 by Dr. Gean Birchwood Frank [name] is a 78 y.o. male with history of chronic kidney disease stage V with left arm AV fistula follows with Dr. Justin Mend nephrologist per patient's daughter who provided history to the ER physician has been having intermittent nausea vomiting last few days and had followed up with his primary care physician who drew labs and found his creatinine be significantly elevated.  The labs available in care everywhere shows creatinine of 4.8 in January 2020.  In addition patient has been having some near syncopal episodes since this morning.  His vomiting is stopped denies any diarrhea chest pain shortness of breath.  Interim history Admitted with near syncopal event.  Also found to have progressive kidney disease and started on dialysis.  However now having clots from fistula.  Vascular surgery consulted, pending fistulogram, likely on 01/27/2019. Assessment & Plan   Near Syncope -Patient feels he was dehydrated-as he states he had vomiting prior to admission -Denies any current dizziness -COVID negative -Orthostatic vitals positive drop from supine to standing -Echocardiogram EF 60 to 65%.  LV diastolic parameters consistent with impaired relaxation. -PT consulted no further therapy needed.  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 Justin Mend, nephrology -Per Care everywhere, in January creatinine was 4.82 -Creatinine peaked to 6.49 -Patient is able to produce urine but states it has painful at times and dark in color -had had forgetfulness as well as is her nausea and dysphagia  -Nephrology consulted and appreciated. Discussed with Dr. Johnney Ou- patient will start dialysis today, have HD session on 5/15, and one on 5/16.  -patient does have a LUE AVF in place, but possibly not functional- vascular surgery consulted and appreciated -Was scheduled for fistula declot on 01/23/2019 however this did get pushed back.  Patient did receive ultrasound-guided insertion of palindrome catheter. Planning for thrombectomy of AVF today 01/27/2019 -dialysis started -Creatinine currently 4.44 -Of note, patient does have a HD slot at [facility] on TTS at 10:15 AM with report to clinic at 9:30 AM on the first day  Hypokalemia -Resolved, continue to monitor BMP  Hypothyroidism -Continue Synthroid  Diabetes mellitus, type II -Appears to be diet controlled as patient is not on any home medications -Last hemoglobin A1c was 5.9 on 06/04/2018  Chronic pain -Continue home regimen  BPH/ urinary retention -Continue Flomax -patient continues to required in/out catheter in the evening -may need foley catheter placed with urology follow up as an outpatient  -obtained renal US: Normal appearance of the kidneys, negative urinary bladder. -Discussed with urology via phone, Dr. Tresa Moore, recommended placing foley catheter with outpatient follow up  DVT Prophylaxis  heparin  Code Status: Full  Family Communication: None at bedside.  Discussed with daughter  Disposition Plan: Admitted. Pending further recommendations from nephrology and vascular surgery.  Suspect home upon discharge.   Consultants Nephrology Vascular surgery  Procedures  Echocardiogram Renal US Ultrasound-guided insertion of palindrome catheter  Antibiotics   Anti-infectives (From admission, onward)   Start     Dose/Rate Route Frequency Ordered Stop   01/27/19 1400  ceFAZolin (ANCEF) IVPB 1 g/50 mL premix  Status:  Discontinued  Note to Pharmacy:  Send with pt to OR   1 g 100 mL/hr over 30 Minutes  Intravenous On call 01/26/19 0754 01/26/19 1640   01/27/19 1400  ceFAZolin (ANCEF) IVPB 2g/100 mL premix    Note to Pharmacy:  Send with pt to OR   2 g 200 mL/hr over 30 Minutes Intravenous On call 01/26/19 1640 01/28/19 1400      Subjective:   Frank [name] seen and examined today.  He only complains of dry mouth this morning and would like to have some ice chips.  Awaiting surgery.  Denies current chest pain, shortness of breath, abdominal pain, nausea or vomiting, diarrhea or constipation, dizziness or headache.   Objective:   Vitals:   01/26/19 1204 01/26/19 1953 01/27/19 0250 01/27/19 0451  BP: 126/72 130/70  (!) 120/51  Pulse: 94 77  70  Resp: 15 16  (!) 24  Temp: 98.6 F (37 C) 98.6 F (37 C)  98 F (36.7 C)  TempSrc: Oral Oral  Oral  SpO2: 98% 98%  98%  Weight:   85.8 kg   Height:        Intake/Output Summary (Last 24 hours) at 01/27/2019 0905 Last data filed at 01/27/2019 0235 Gross per 24 hour  Intake 480 ml  Output 815 ml  Net -335 ml   Filed Weights   01/26/19 0720 01/26/19 1032 01/27/19 0250  Weight: 87.8 kg 85.3 kg 85.8 kg   Exam  General: Well developed, well nourished, NAD, appears stated age  HEENT: NCAT, mucous membranes moist.   Neck: Supple, Right catheter in place  Cardiovascular: S1 S2 auscultated, RRR, no murmur  Respiratory: Clear to auscultation bilaterally with equal chest rise  Abdomen: Soft, nontender, nondistended, + bowel sounds  Extremities: warm dry without cyanosis clubbing or edema. L AVF   Neuro: AAOx3, nonfocal  Psych: Appropriate mood and affect, pleasant   Data Reviewed: I have personally reviewed following labs and imaging studies  CBC: Recent Labs  Lab 01/23/19 0349 01/24/19 0401 01/25/19 0525 01/26/19 0549 01/27/19 0521  WBC 9.7 10.2 10.9* 10.6* 9.5  HGB 11.0* 10.9* 11.2* 11.0* 10.6*  HCT 32.8* 32.8* 34.5* 34.5* 32.3*  MCV 93.2 94.0 95.8 96.6 96.7  PLT 168 144* 136* 144* 921*   Basic Metabolic Panel:  Recent Labs  Lab 01/22/19 1044 01/23/19 0349 01/24/19 0401 01/25/19 0525 01/26/19 0549 01/27/19 0521  NA  --  141 143 141 140 141  K  --  3.1* 3.6 3.5 3.9 4.0  CL  --  104 106 102 106 105  CO2  --  24 24 26 24 26   GLUCOSE  --  192* 182* 141* 216* 147*  BUN  --  114* 79* 48* 62* 54*  CREATININE  --  6.25* 4.58* 3.94* 5.07* 4.44*  CALCIUM  --  8.0* 8.0* 8.4* 8.3* 8.5*  MG 1.9  --   --  1.8  --   --   PHOS  --  5.0* 4.1 3.9 3.4  --    GFR: Estimated Creatinine Clearance: 14.9 mL/min (A) (by C-G formula based on SCr of 4.44 mg/dL (H)). Liver Function Tests: Recent Labs  Lab 01/22/19 0541 01/23/19 0349 01/24/19 0401 01/25/19 0525 01/26/19 0549  AST 48*  --   --   --   --   ALT 27  --   --   --   --   ALKPHOS 68  --   --   --   --   BILITOT  0.5  --   --   --   --   PROT 6.4*  --   --   --   --   ALBUMIN 3.0* 2.8* 2.8* 2.9* 2.9*   No results for input(s): LIPASE, AMYLASE in the last 168 hours. No results for input(s): AMMONIA in the last 168 hours. Coagulation Profile: Recent Labs  Lab 01/27/19 0521  INR 1.1   Cardiac Enzymes: Recent Labs  Lab 01/21/19 1501 01/21/19 1841 01/21/19 2304 01/22/19 0541 01/22/19 1044  TROPONINI 0.07* 0.08* 0.09* 0.10* 0.09*   BNP (last 3 results) No results for input(s): PROBNP in the last 8760 hours. HbA1C: No results for input(s): HGBA1C in the last 72 hours. CBG: Recent Labs  Lab 01/26/19 0619 01/26/19 1201 01/26/19 1654 01/26/19 2109 01/27/19 0550  GLUCAP 247* 141* 195* 135* 113*   Lipid Profile: No results for input(s): CHOL, HDL, LDLCALC, TRIG, CHOLHDL, LDLDIRECT in the last 72 hours. Thyroid Function Tests: No results for input(s): TSH, T4TOTAL, FREET4, T3FREE, THYROIDAB in the last 72 hours. Anemia Panel: No results for input(s): VITAMINB12, FOLATE, FERRITIN, TIBC, IRON, RETICCTPCT in the last 72 hours. Urine analysis:    Component Value Date/Time   COLORURINE YELLOW 01/21/2019 1551   APPEARANCEUR HAZY (A)  01/21/2019 1551   LABSPEC 1.013 01/21/2019 1551   PHURINE 5.0 01/21/2019 1551   GLUCOSEU 50 (A) 01/21/2019 1551   HGBUR MODERATE (A) 01/21/2019 1551   BILIRUBINUR NEGATIVE 01/21/2019 1551   KETONESUR 5 (A) 01/21/2019 1551   PROTEINUR >=300 (A) 01/21/2019 1551   NITRITE NEGATIVE 01/21/2019 1551   LEUKOCYTESUR NEGATIVE 01/21/2019 1551   Sepsis Labs: @LABRCNTIP (procalcitonin:4,lacticidven:4)  ) Recent Results (from the past 240 hour(s))  Urine Culture     Status: None   Collection Time: 01/21/19  3:51 PM  Result Value Ref Range Status   Specimen Description URINE, RANDOM  Final   Special Requests NONE  Final   Culture   Final    NO GROWTH Performed at [facility], [place] [address] [place], [place] [address]    Report Status 01/22/2019 FINAL  Final  SARS Coronavirus 2 [facility] order, Performed in [place] hospital lab)     Status: None   Collection Time: 01/21/19  4:24 PM  Result Value Ref Range Status   SARS Coronavirus 2 NEGATIVE NEGATIVE Final    Comment: (NOTE) If result is NEGATIVE SARS-CoV-2 target nucleic acids are NOT DETECTED. The SARS-CoV-2 RNA is generally detectable in upper and lower  respiratory specimens during the acute phase of infection. The lowest  concentration of SARS-CoV-2 viral copies this assay can detect is 250  copies / mL. A negative result does not preclude SARS-CoV-2 infection  and should not be used as the sole basis for treatment or other  patient management decisions.  A negative result may occur with  improper specimen collection / handling, submission of specimen other  than nasopharyngeal swab, presence of viral mutation(s) within the  areas targeted by this assay, and inadequate number of viral copies  (<250 copies / mL). A negative result must be combined with clinical  observations, patient history, and epidemiological information. If result is POSITIVE SARS-CoV-2 target nucleic acids are DETECTED. The SARS-CoV-2 RNA is  generally detectable in upper and lower  respiratory specimens dur ing the acute phase of infection.  Positive  results are indicative of active infection with SARS-CoV-2.  Clinical  correlation with patient history and other diagnostic information is  necessary to determine patient infection status.  Positive results do  not rule out bacterial infection or co-infection with other viruses. If result is PRESUMPTIVE POSTIVE SARS-CoV-2 nucleic acids MAY BE PRESENT.   A presumptive positive result was obtained on the submitted specimen  and confirmed on repeat testing.  While 2019 novel coronavirus  (SARS-CoV-2) nucleic acids may be present in the submitted sample  additional confirmatory testing may be necessary for epidemiological  and / or clinical management purposes  to differentiate between  SARS-CoV-2 and other Sarbecovirus currently known to infect humans.  If clinically indicated additional testing with an alternate test  methodology [phone]) is advised. The SARS-CoV-2 RNA is generally  detectable in upper and lower respiratory sp ecimens during the acute  phase of infection. The expected result is Negative. Fact Sheet for Patients:  StrictlyIdeas.no Fact Sheet for Healthcare Providers: BankingDealers.co.za This test is not yet approved or cleared by the [place] FDA and has been authorized for detection and/or diagnosis of SARS-CoV-2 by FDA under an Emergency Use Authorization (EUA).  This EUA will remain in effect (meaning this test can be used) for the duration of the COVID-19 declaration under Section 564(b)(1) of the Act, 21 U.S.C. section 360bbb-3(b)(1), unless the authorization is terminated or revoked sooner. Performed at [facility], [place] [address], [place], [place] [address]   MRSA PCR Screening     Status: None   Collection Time: 01/24/19 10:10 PM  Result Value Ref Range Status   MRSA by PCR NEGATIVE  NEGATIVE Final    Comment:        The GeneXpert MRSA Assay (FDA approved for NASAL specimens only), is one component of a comprehensive MRSA colonization surveillance program. It is not intended to diagnose MRSA infection nor to guide or monitor treatment for MRSA infections. Performed at [facility], [place] [address], [place], [place] [address]   Surgical PCR screen     Status: None   Collection Time: 01/27/19  5:10 AM  Result Value Ref Range Status   MRSA, PCR NEGATIVE NEGATIVE Final   Staphylococcus aureus NEGATIVE NEGATIVE Final    Comment: (NOTE) The Xpert SA Assay (FDA approved for NASAL specimens in patients 17 years of age and older), is one component of a comprehensive surveillance program. It is not intended to diagnose infection nor to guide or monitor treatment. Performed at [facility], [place] [address] [place],  [address]       Radiology Studies: US Renal  Result Date: 01/26/2019 CLINICAL DATA:  78 year old male with urinary retention. EXAM: RENAL / URINARY TRACT ULTRASOUND COMPLETE COMPARISON:  Abdomen MRI 09/13/2010. FINDINGS: Right Kidney: Renal measurements: 8.3 x 5.3 x 4.2 centimeters = volume: 97 mL. Normal cortical echogenicity. No hydronephrosis or right renal mass. Left Kidney: Renal measurements: 9.4 x 4.9 x 4.4 centimeters = volume: 105 mL. Normal cortical echogenicity. No left hydronephrosis or renal mass. Bladder: Appears normal for degree of bladder distention. Prostate hypertrophy, measuring Other findings: 43 mm diameter. IMPRESSION: 1. Normal for age ultrasound appearance of the kidneys. 2. Negative urinary bladder.  Prostate hypertrophy. Electronically Signed   By: Genevie Ann M.D.   On: 01/26/2019 22:43     Scheduled Meds: . allopurinol  100 mg Oral Daily  . aspirin EC  81 mg Oral Daily  . atorvastatin  20 mg Oral Daily  . calcitRIOL  0.25 mcg Oral Daily  . Chlorhexidine Gluconate Cloth  6 each Topical Q0600  . gabapentin  300  mg Oral Q1500  .  heparin  5,000 Units Subcutaneous Q8H  . insulin aspart  0-9 Units Subcutaneous TID WC  . levothyroxine  150 mcg Oral Q0600  . multivitamin  1 tablet Oral Daily  . mupirocin ointment  1 application Nasal BID  . tamsulosin  0.4 mg Oral Daily   Continuous Infusions: .  ceFAZolin (ANCEF) IV       LOS: 5 days   Time Spent in minutes   30 minutes   Euva Rundell D.O. on 01/27/2019 at 9:05 AM  Between 7am to 7pm - Please see pager noted on [email or web address]  After 7pm go to [email or web address]  And look for the night coverage person covering for me after hours  [facility] Office  [phone]

## 2019-01-27 NOTE — Anesthesia Preprocedure Evaluation (Addendum)
Anesthesia Evaluation  Patient identified by MRN, date of birth, ID band Patient awake    Reviewed: Allergy & Precautions, NPO status , Patient's Chart, lab work & pertinent test results  Airway Mallampati: I  TM Distance: >3 FB Neck ROM: Full    Dental  (+) Edentulous Upper, Edentulous Lower   Pulmonary former smoker,     + decreased breath sounds      Cardiovascular hypertension, Pt. on medications + Peripheral Vascular Disease   Rhythm:Regular Rate:Normal     Neuro/Psych  Neuromuscular disease negative psych ROS   GI/Hepatic negative GI ROS, Neg liver ROS,   Endo/Other  diabetesHypothyroidism   Renal/GU ESRF and DialysisRenal disease     Musculoskeletal  (+) Arthritis , Osteoarthritis,    Abdominal Normal abdominal exam  (+)   Peds  Hematology negative hematology ROS (+)   Anesthesia Other Findings   Reproductive/Obstetrics                            Lab Results  Component Value Date   WBC 9.5 01/27/2019   HGB 10.6 (L) 01/27/2019   HCT 32.3 (L) 01/27/2019   MCV 96.7 01/27/2019   PLT 125 (L) 01/27/2019   Lab Results  Component Value Date   CREATININE 4.44 (H) 01/27/2019   BUN 54 (H) 01/27/2019   NA 141 01/27/2019   K 4.0 01/27/2019   CL 105 01/27/2019   CO2 26 01/27/2019   Lab Results  Component Value Date   INR 1.1 01/27/2019   COVID-19 Labs  No results for input(s): DDIMER, FERRITIN, LDH, CRP in the last 72 hours.  Lab Results  Component Value Date   [place] NEGATIVE 01/21/2019   Echo: 1. The left ventricle has normal systolic function with an ejection fraction of 60-65%. The cavity size was normal. There is moderate concentric left ventricular hypertrophy. Left ventricular diastolic Doppler parameters are consistent with impaired  relaxation.  2. The right ventricle has normal systolic function. The cavity was normal. There is no increase in right ventricular  wall thickness.  3. Left atrial size was mildly dilated.  4. The mitral valve is degenerative. Mild thickening of the mitral valve leaflet. There is mild mitral annular calcification present.  5. The aortic valve is tricuspid. Mild thickening of the aortic valve. Mild calcification of the aortic valve. Aortic valve regurgitation was not assessed by color flow Doppler.   Anesthesia Physical Anesthesia Plan  ASA: III  Anesthesia Plan: General   Post-op Pain Management:    Induction: Intravenous  PONV Risk Score and Plan: 3 and Ondansetron, Dexamethasone and Treatment may vary due to age or medical condition  Airway Management Planned: Oral ETT  Additional Equipment: None  Intra-op Plan:   Post-operative Plan: Extubation in OR  Informed Consent: I have reviewed the patients History and Physical, chart, labs and discussed the procedure including the risks, benefits and alternatives for the proposed anesthesia with the patient or authorized representative who has indicated his/her understanding and acceptance.     Dental advisory given  Plan Discussed with: CRNA  Anesthesia Plan Comments:        Anesthesia Quick Evaluation

## 2019-01-27 NOTE — Progress Notes (Signed)
Pt HR sustaining in the 130s. MD notified. Will continue to monitor.

## 2019-01-27 NOTE — Anesthesia Procedure Notes (Signed)
Procedure Name: Intubation Date/Time: 01/27/2019 2:55 PM Performed by: Kathryne Hitch, CRNA Pre-anesthesia Checklist: Patient identified, Emergency Drugs available, Suction available, Timeout performed and Patient being monitored Patient Re-evaluated:Patient Re-evaluated prior to induction Oxygen Delivery Method: Circle system utilized Preoxygenation: Pre-oxygenation with 100% oxygen Induction Type: IV induction, Rapid sequence and Cricoid Pressure applied Ventilation: Mask ventilation without difficulty Laryngoscope Size: Miller and 2 Grade View: Grade I Tube type: Oral Tube size: 7.5 mm Number of attempts: 1 Airway Equipment and Method: Patient positioned with wedge pillow and Stylet Placement Confirmation: ETT inserted through vocal cords under direct vision,  positive ETCO2 and breath sounds checked- equal and bilateral Secured at: 23 cm Tube secured with: Tape Dental Injury: Teeth and Oropharynx as per pre-operative assessment

## 2019-01-27 NOTE — Care Management Important Message (Signed)
Important Message  Patient Details  Name: Frank [name] MRN: [id number] Date of Birth: December 22, 1940   Medicare Important Message Given:  Yes    Orbie Pyo 01/27/2019, 2:35 PM

## 2019-01-27 NOTE — Transfer of Care (Signed)
Immediate Anesthesia Transfer of Care Note  Patient: Frank [name]  Procedure(s) Performed: THROMBECTOMY Left arm ARTERIOVENOUS FISTULA  and Left arm Arteriovenous gortex graft (Left Arm Upper) Left arm FISTULOGRAM/ (Left Arm Upper)  Patient Location: PACU  Anesthesia Type:General  Level of Consciousness: confused  Airway & Oxygen Therapy: Patient Spontanous Breathing  Post-op Assessment: Report given to RN and Post -op Vital signs reviewed and stable  Post vital signs: Reviewed and stable  Last Vitals:  Vitals Value Taken Time  BP    Temp    Pulse 94 01/27/2019  5:13 PM  Resp 18 01/27/2019  5:13 PM  SpO2 96 % 01/27/2019  5:13 PM  Vitals shown include unvalidated device data.  Last Pain:  Vitals:   01/27/19 1204  TempSrc: Oral  PainSc:       Patients Stated Pain Goal: 0 (29/84/73 0856)  Complications: No apparent anesthesia complications

## 2019-01-27 NOTE — Op Note (Signed)
Procedure: Thrombectomy left upper arm AV fistula, left arm AV fistulogram, left upper arm graft 4-7  Preoperative diagnosis: Occlusion left upper arm AV fistula  Postoperative diagnosis: Same  Anesthesia: General  Assistant: Arlee Muslim, PA-C  Operative findings: Diffuse narrowing left upper arm AV fistula unsalvageable  4 to 7 mm PTFE end to side axillary vein  Operative details: After obtaining informed consent, the patient was taken the operating.  The patient was placed in supine position operating table.  After induction general anesthesia and endotracheal elevation, the patient's entire left upper extremities prepped and draped in usual sterile fashion.  An oblique incision was made in the left antecubital area over a pre-existing basilic vein fistula.  The incision was carried down through the subcutaneous tissues down the level of the AV fistula.  The vein part of the fistula was dissected free circumferentially and a vessel loop placed around this.  Then proceeded to dissect out the brachial artery above and below the level of the anastomosis and Vesseloops were placed around this.  The patient was then given 7000 units of intravenous heparin.  A transverse venotomy was made in the AV fistula just after the anastomosis.  There was thrombus within the fistula.  I was able to pass a 3 dilator easily through the arterial anastomosis and there was no obvious suggestion of arterial narrowing.  At this point a #4 Fogarty catheter was passed distally up through the fistula multiple passes were made with minimal retrieval of thrombotic material.  There seem to be narrowing on passing the Fogarty about mid segment of the fistula.  At this point the venotomy was closed with a running 6-0 Prolene suture.  A micropuncture needle was then used to catheterize the AV fistula and the micropuncture wire advanced into the fistula.  The micropuncture sheath was then placed over this.  With the arterial  proximally for inflow occlusion contrast angiogram was performed.  Initially there was no visualization of flow of contrast into the chest.  Therefore we moved to the C arm down to the antecubital area and on injecting contrast there was extravasation and minimal filling of the vein suggestive of multiple narrowings of the vein and an unsalvageable fistula.  At this point the micropuncture needle was removed and the hole closed with a single 6-0 Prolene suture.  The patient was given an additional 3000 units of heparin followed by an additional 7000 units of heparin.  The old fistula was transected just after its origin and a 4 to 7 mm tapered PTFE graft follow-up in the operative field.  A longitudinal opening was made in the left axilla carried on through subcutaneous tissues down level axillary vein.  The graft was then tunneled in an arcing configuration over the biceps muscle the graft was spatulated and sewn endograft to the prior cuff of the vein from the fistula using a running 6-0 Prolene suture.  Just prior to completion anastomosis it was for blood backbled and thoroughly flushed.  Anastomosis was secured Vesseloops released there is good pulsatile flow in the graft immediately.  One additional repair stitch was placed.  Graft was pulled taut the length in the upper arm.  The vein was controlled proximally and distally with fine bulldog clamps.  Longitudinal opening was made in the vein and a graft cut the length beveled and sewn end of graft to side of vein using a running 6-0 Prolene suture.  Just prior to completion anastomosis it was for blood backbled and thoroughly  flushed.  Anastomosis was secured clamps were released there was a palpable thrill above and at the proximal aspect of the graft immediately.  Hemostasis was obtained with addition of 100 mg of protamine.  Direct pressure was also used.  The subcutaneous tissues of both incisions was closed with a running 3-0 Vicryl suture.  The skin of  both incisions was closed with a 4-0 Vicryl subcuticular stitch.  Dermabond was applied to both incisions.  Patient tolerated the procedure well and there were no complications.  The instrument sponge and needle count was correct the end of the case.  The patient was taken the recovery room in stable condition.  Ruta Hinds, MD [facility] Office: [phone] Pager: [phone]

## 2019-01-27 NOTE — Plan of Care (Signed)
Progressing toward goal

## 2019-01-28 ENCOUNTER — Encounter (HOSPITAL_COMMUNITY): Payer: Self-pay | Admitting: Vascular Surgery

## 2019-01-28 LAB — HEMOGLOBIN A1C
Hgb A1c MFr Bld: 6.6 % — ABNORMAL HIGH (ref 4.8–5.6)
Mean Plasma Glucose: 142.72 mg/dL

## 2019-01-28 LAB — RENAL FUNCTION PANEL
Albumin: 2.6 g/dL — ABNORMAL LOW (ref 3.5–5.0)
Anion gap: 13 (ref 5–15)
BUN: 61 mg/dL — ABNORMAL HIGH (ref 8–23)
CO2: 21 mmol/L — ABNORMAL LOW (ref 22–32)
Calcium: 8.5 mg/dL — ABNORMAL LOW (ref 8.9–10.3)
Chloride: 107 mmol/L (ref 98–111)
Creatinine, Ser: 4.57 mg/dL — ABNORMAL HIGH (ref 0.61–1.24)
GFR calc Af Amer: 13 mL/min — ABNORMAL LOW (ref >60)
GFR calc non Af Amer: 12 mL/min — ABNORMAL LOW (ref >60)
Glucose, Bld: 247 mg/dL — ABNORMAL HIGH (ref 70–99)
Phosphorus: 5.2 mg/dL — ABNORMAL HIGH (ref 2.5–4.6)
Potassium: 4.9 mmol/L (ref 3.5–5.1)
Sodium: 141 mmol/L (ref 135–145)

## 2019-01-28 LAB — CBC
HCT: 32.6 % — ABNORMAL LOW (ref 39.0–52.0)
Hemoglobin: 10.4 g/dL — ABNORMAL LOW (ref 13.0–17.0)
MCH: 31.3 pg (ref 26.0–34.0)
MCHC: 31.9 g/dL (ref 30.0–36.0)
MCV: 98.2 fL (ref 80.0–100.0)
Platelets: 113 10*3/uL — ABNORMAL LOW (ref 150–400)
RBC: 3.32 MIL/uL — ABNORMAL LOW (ref 4.22–5.81)
RDW: 15 % (ref 11.5–15.5)
WBC: 10.6 10*3/uL — ABNORMAL HIGH (ref 4.0–10.5)
nRBC: 0 % (ref 0.0–0.2)

## 2019-01-28 LAB — GLUCOSE, CAPILLARY
Glucose-Capillary: 205 mg/dL — ABNORMAL HIGH (ref 70–99)
Glucose-Capillary: 115 mg/dL — ABNORMAL HIGH (ref 70–99)

## 2019-01-28 MED ORDER — HEPARIN SODIUM (PORCINE) 1000 UNIT/ML DIALYSIS
1000.0000 [IU] | INTRAMUSCULAR | Status: DC | PRN
  Administered 2019-01-28: 3400 [IU] via INTRAVENOUS_CENTRAL
  Filled 2019-01-28: qty 1

## 2019-01-28 MED ORDER — LIDOCAINE HCL (PF) 1 % IJ SOLN: 5.0000 mL | INTRAMUSCULAR | Status: DC | PRN

## 2019-01-28 MED ORDER — HEPARIN SODIUM (PORCINE) 5000 UNIT/ML IJ SOLN: 5000.0000 [IU] | Freq: Three times a day (TID) | INTRAMUSCULAR | Status: DC

## 2019-01-28 MED ORDER — SODIUM BICARBONATE 650 MG PO TABS
650.0000 mg | ORAL_TABLET | Freq: Two times a day (BID) | ORAL | Status: DC
  Administered 2019-01-28: 650 mg via ORAL
  Filled 2019-01-28: qty 1

## 2019-01-28 MED ORDER — TAMSULOSIN HCL 0.4 MG PO CAPS: 0.8000 mg | ORAL_CAPSULE | Freq: Every day | ORAL | 0 refills | Status: AC

## 2019-01-28 MED ORDER — HEPARIN SODIUM (PORCINE) 1000 UNIT/ML IJ SOLN
INTRAMUSCULAR | Status: AC
  Filled 2019-01-28: qty 4

## 2019-01-28 MED ORDER — LIDOCAINE-PRILOCAINE 2.5-2.5 % EX CREA: 1.0000 "application " | TOPICAL_CREAM | CUTANEOUS | Status: DC | PRN

## 2019-01-28 MED ORDER — HYDROCODONE-ACETAMINOPHEN 5-325 MG PO TABS
1.0000 | ORAL_TABLET | ORAL | Status: DC | PRN
  Administered 2019-01-28: 1 via ORAL
  Filled 2019-01-28: qty 1

## 2019-01-28 MED ORDER — SODIUM CHLORIDE 0.9 % IV SOLN: 100.0000 mL | INTRAVENOUS | Status: DC | PRN

## 2019-01-28 MED ORDER — CALCITRIOL 0.5 MCG PO CAPS: 0.5000 ug | ORAL_CAPSULE | ORAL | Status: DC

## 2019-01-28 MED ORDER — PENTAFLUOROPROP-TETRAFLUOROETH EX AERO: 1.0000 "application " | INHALATION_SPRAY | CUTANEOUS | Status: DC | PRN

## 2019-01-28 MED ORDER — ALTEPLASE 2 MG IJ SOLR: 2.0000 mg | Freq: Once | INTRAMUSCULAR | Status: DC | PRN

## 2019-01-28 NOTE — Progress Notes (Signed)
Bladder scan 63 ml

## 2019-01-28 NOTE — Plan of Care (Signed)

## 2019-01-28 NOTE — Progress Notes (Signed)
Renal Navigator received call from Dr. Webb/Nephrologist who states patient is ready for discharge today from a renal standpoint, and does not need to have inpt HD prior to discharge if he can start in the OP HD clinic tomorrow.  Renal Navigator spoke with Attending MD/Dr. Florene Glen, who states he will speak with Nephrology and Vascular Surgery, and anticipates that patient will be ready for d/c today. Renal Navigator spoke with patient's daughter/[name] who has concerns about her father being discharged too soon and wants to be a part of the decision making process regarding her father's discharge. She states she has already informed her father's RN of this today. Renal Navigator informed her that if her father is not ready for discharge today in order to start in the OP HD clinic tomorrow, starting in the OP HD clinic on Saturday is not a problem, but it is asked, if this will be the case, that they report to the clinic on the Friday prior in order to complete paperwork. Patient's daughter stated understanding. Renal Navigator will follow for final decision regarding whether or not discharge will take place today and will notify OP HD clinic/[facility] to expect patient as a new start tomorrow if he is discharged today.  Alphonzo Cruise, [place] Renal Navigator [phone]

## 2019-01-28 NOTE — Anesthesia Postprocedure Evaluation (Signed)
Anesthesia Post Note  Patient: Frank [name]  Procedure(s) Performed: THROMBECTOMY Left arm ARTERIOVENOUS FISTULA  and Left arm Arteriovenous gortex graft (Left Arm Upper) Left arm FISTULOGRAM/ (Left Arm Upper)     Patient location during evaluation: Other Anesthesia Type: General Level of consciousness: awake and alert Pain management: pain level controlled Vital Signs Assessment: post-procedure vital signs reviewed and stable Respiratory status: spontaneous breathing, nonlabored ventilation, respiratory function stable and patient connected to nasal cannula oxygen Cardiovascular status: blood pressure returned to baseline and stable Postop Assessment: no apparent nausea or vomiting Anesthetic complications: no    Last Vitals:  Vitals:   01/28/19 0421 01/28/19 0424  BP: (!) 122/56   Pulse: 69 73  Resp: (!) 24 20  Temp: 36.5 C   SpO2: 98%                Effie Berkshire

## 2019-01-28 NOTE — Discharge Instructions (Signed)
Eating Plan for Dialysis  Dialysis is a treatment that cleans your blood. It is used when your kidneys are damaged. When you need dialysis, you should watch what you eat. This is because some nutrients can build up in your blood between treatments and make you sick.  Your doctor or diet specialist (dietitian) will:   Tell you what nutrients you should include or avoid.   Tell you how much of these nutrients you should get each day.   Help you plan meals.   Tell you how much to drink each day.  What are tips for following this plan?  Reading food labels   Check food labels for:  ? Potassium. This is found in milk, fruits, and vegetables.  ? Phosphorus. This is found in milk, cheese, beans, nuts, and carbonated beverages.  ? Salt (sodium). This is in processed meats, cured meats, ready-made frozen meals, canned vegetables, and salty snack foods.   Try to find foods that are low in potassium, phosphorus, and sodium.   Look for foods that are labeled "sodium free," "reduced sodium," or "low sodium."  Shopping   Do not buy whole-grain and high-fiber foods.   Do not buy or use salt substitutes.   Do not buy processed foods.  Cooking   Drain all fluid from cooked vegetables and canned fruits before you eat them.   Before you cook potatoes, cut them into small pieces. Then boil them in unsalted water.   Try using herbs and spices that do not contain sodium to add flavor.  Meal planning  Most people on dialysis should try to eat:   6-11 servings of grains each day. One serving is equal to 1 slice of bread or  cup of cooked rice or pasta.   2-3 servings of low-potassium vegetables each day. One serving is equal to  cup.   2-3 servings of low-potassium fruits each day. One serving is equal to  cup.   Protein, such as meat, poultry, fish, and eggs. Talk with your doctor or dietitian about the right amount and type of protein to eat.    cup of dairy each day.  General information   Follow your doctor's  instructions about how much to drink. You may be told to:  ? Write down what you drink.  ? Write down the foods you eat that are made mostly from water, such as gelatin and soups.  ? Drink from small cups.   Take vitamin and mineral supplements only as told by your doctor.   Take over-the-counter and prescription medicines only as told by your doctor.  What foods can I eat?         Fruits  Apples. Fresh or frozen berries. Fresh or canned pears, peaches, and pineapple. Grapes. Plums.  Vegetables  Fresh or frozen broccoli, carrots, and green beans. Cabbage. Cauliflower. Celery. Cucumbers. Eggplant. Radishes. Zucchini.  Grains  White bread. White rice. Cooked cereal. Unsalted popcorn. Tortillas. Pasta.  Meats and other proteins  Fresh or frozen beef, pork, chicken, and fish. Eggs.  Dairy  Cream cheese. Heavy cream. Ricotta cheese.  Beverages  Apple cider. Cranberry juice. Grape juice. Lemonade. Black coffee. Rice milk (that is not enriched or fortified).  Seasonings and condiments  Herbs. Spices. Jam and jelly. Honey.  Sweets and desserts  Sherbet. Cakes. Cookies.  Fats and oils  Olive oil, canola oil, and safflower oil.  Other foods  Non-dairy creamer. Non-dairy whipped topping. Homemade broth without salt.  The items listed above   may not be a complete list of foods and beverages you can eat. Contact your dietitian for more options.  What foods should I avoid?  Fruits  Star fruit. Bananas. Oranges. Kiwi. Nectarines. Prunes. Melon. Dried fruit. Avocado.  Vegetables  Potatoes. Beets. Tomatoes. Winter squash and pumpkin. Asparagus. Spinach. Parsnips.  Grains  Whole-grain bread. Whole-grain pasta. High-fiber cereal.  Meats and other proteins  Canned, smoked, and cured meats. Packaged lunch meat. Sardines. Nuts and seeds. Peanut butter. Beans and legumes.  Dairy  Milk. Buttermilk. Yogurt. Cheese and cottage cheese. Processed cheese spreads.  Beverages  Orange juice. Prune juice. Carbonated soft drinks.  Seasonings and  condiments  Salt. Salt substitutes. Soy sauce.  Sweets and desserts  Ice cream. Chocolate. Candied nuts.  Fats and oils  Butter. Margarine.  Other foods  Ready-made frozen meals. Canned soups.  The items listed above may not be a complete list of foods and beverages you should avoid. Contact your dietitian for more information.  Summary   If you are having dialysis, it is important to watch what you eat. Certain nutrients and wastes can build up in your blood and cause you to get sick.   Your dietitian will help you make an eating plan that meets your needs.   Avoid foods that are high in potassium, salt (sodium), and phosphorus. Restrict fluids as told by your doctor or dietitian.  This information is not intended to replace advice given to you by your health care provider. Make sure you discuss any questions you have with your health care provider.  Document Released: 02/26/2012 Document Revised: 11/13/2017 Document Reviewed: 08/28/2017  Elsevier Interactive Patient Education  2019 [employer]

## 2019-01-28 NOTE — Progress Notes (Signed)
Renal Navigator received call from Attending/Dr. Florene Glen, who states patient will be discharged today. Renal Navigator called K. Stovall/Renal PA to inform and notified OP HD clinic/[facility] to expect patient to start in the clinic tomorrow for first treatment.  Renal Navigator spoke with patient's daughter/[name] to inform. She states she still has not spoken with medical team regarding discharge. Renal Navigator advised she call patient's RN to discuss. She knows to arrive at the OP HD clinic at 9:55am for paperwork completion prior to first treatment.   Frank [name], [place] Renal Navigator [phone]

## 2019-01-28 NOTE — Progress Notes (Signed)
[facility] Progress Note   Subjective:     Overnight: Brief tachycardic episode to 130s, asymptomatic. Underwent Left AV fistula thrombectomy   Today, Frank [name] was examined at bedside and was in happy spirits.  He is very pleasant and his only complaint today is left upper extremity pain following fistula thrombectomy yesterday.  He denies swelling at the area.  Objective Vitals:   01/27/19 2209 01/28/19 0306 01/28/19 0421 01/28/19 0424  BP:   (!) 122/56   Pulse: 79  69 73  Resp:   (!) 24 20  Temp:   97.7 F (36.5 C)   TempSrc:   Oral   SpO2:   98%   Weight:  86.2 kg    Height:       Physical Exam General: In NAD, lying in bed comfortably  Heart: RRR, no MGR Lungs: CTABL, no wheezes, crackles, rhonchi  Abdomen: Soft, non distended, NTTP Extremities: No edema Dialysis Access:  Right IJ catheter, Left AV Fistula with audible thrill on auscultation.  Additional Objective Labs: Basic Metabolic Panel: Recent Labs  Lab 01/25/19 0525 01/26/19 0549 01/27/19 0521 01/28/19 0437  NA 141 140 141 141  K 3.5 3.9 4.0 4.9  CL 102 106 105 107  CO2 26 24 26  21*  GLUCOSE 141* 216* 147* 247*  BUN 48* 62* 54* 61*  CREATININE 3.94* 5.07* 4.44* 4.57*  CALCIUM 8.4* 8.3* 8.5* 8.5*  PHOS 3.9 3.4  --  5.2*   Liver Function Tests: Recent Labs  Lab 01/22/19 0541  01/25/19 0525 01/26/19 0549 01/28/19 0437  AST 48*  --   --   --   --   ALT 27  --   --   --   --   ALKPHOS 68  --   --   --   --   BILITOT 0.5  --   --   --   --   PROT 6.4*  --   --   --   --   ALBUMIN 3.0*   < > 2.9* 2.9* 2.6*   < > = values in this interval not displayed.   No results for input(s): LIPASE, AMYLASE in the last 168 hours. CBC: Recent Labs  Lab 01/24/19 0401 01/25/19 0525 01/26/19 0549 01/27/19 0521 01/28/19 0437  WBC 10.2 10.9* 10.6* 9.5 10.6*  HGB 10.9* 11.2* 11.0* 10.6* 10.4*  HCT 32.8* 34.5* 34.5* 32.3* 32.6*  MCV 94.0 95.8 96.6 96.7 98.2  PLT 144* 136* 144* 125* 113*    Blood Culture    Component Value Date/Time   SDES URINE, RANDOM 01/21/2019 1551   SPECREQUEST NONE 01/21/2019 1551   CULT  01/21/2019 1551    NO GROWTH Performed at [facility], [place] [address], [place], [place] [address]    REPTSTATUS 01/22/2019 FINAL 01/21/2019 1551    Cardiac Enzymes: Recent Labs  Lab 01/21/19 1501 01/21/19 1841 01/21/19 2304 01/22/19 0541 01/22/19 1044  TROPONINI 0.07* 0.08* 0.09* 0.10* 0.09*   CBG: Recent Labs  Lab 01/27/19 0550 01/27/19 1159 01/27/19 1713 01/27/19 2048 01/28/19 0541  GLUCAP 113* 145* 151* 162* 205*    Studies/Results: US Renal  Result Date: 01/26/2019 CLINICAL DATA:  78 year old male with urinary retention. EXAM: RENAL / URINARY TRACT ULTRASOUND COMPLETE COMPARISON:  Abdomen MRI 09/13/2010. FINDINGS: Right Kidney: Renal measurements: 8.3 x 5.3 x 4.2 centimeters = volume: 97 mL. Normal cortical echogenicity. No hydronephrosis or right renal mass. Left Kidney: Renal measurements: 9.4 x 4.9 x 4.4 centimeters = volume:  105 mL. Normal cortical echogenicity. No left hydronephrosis or renal mass. Bladder: Appears normal for degree of bladder distention. Prostate hypertrophy, measuring Other findings: 43 mm diameter. IMPRESSION: 1. Normal for age ultrasound appearance of the kidneys. 2. Negative urinary bladder.  Prostate hypertrophy. Electronically Signed   By: Genevie Ann M.D.   On: 01/26/2019 22:43   Dg Ang/ext/uni/or Left  Result Date: 01/27/2019 CLINICAL DATA:  Intraoperative venogram. EXAM: LEFT ANG/EXT/UNI/ OR CONTRAST:  See operative report FLUOROSCOPY TIME:  Fluoroscopy Time:  13 seconds Number of Acquired Spot Images: 1 COMPARISON:  None. FINDINGS: Single angiographic image was obtained from the left upper extremity. Filling of multiple small veins in the left upper arm. There is a large focus of contrast that could represent a thrombosed vein or extravasated contrast. IMPRESSION: Intraoperative venogram.  See operative report.  Electronically Signed   By: Markus Daft M.D.   On: 01/27/2019 16:26   Medications: . sodium chloride Stopped (01/27/19 1710)   . allopurinol  100 mg Oral Daily  . aspirin EC  81 mg Oral Daily  . atorvastatin  20 mg Oral Daily  . calcitRIOL  0.25 mcg Oral Daily  . Chlorhexidine Gluconate Cloth  6 each Topical Q0600  . Chlorhexidine Gluconate Cloth  6 each Topical Q0600  . gabapentin  300 mg Oral Q1500  . heparin  5,000 Units Subcutaneous Q8H  . insulin aspart  0-9 Units Subcutaneous TID WC  . levothyroxine  150 mcg Oral Q0600  . multivitamin  1 tablet Oral Daily  . mupirocin ointment  1 application Nasal BID  . tamsulosin  0.4 mg Oral Daily   Dialysis sessions:  1- 5/15 2- 5/16 3- 5/18  Assessment/Plan:  Frank [name] is a 78 year old gentleman with CKD5, T2DM, BPH and hypothyroidism who presented with nausea, non-bilous non bloody emesis and near syncope. On arrival, he was noted to have worsening renal function and has been initiated on hemodialysis.  1. Chronic Kidney Disease Stage V: He underwent Left AV Fistula thrombectomy on 01/27/2019. He tolerated procedure well with only minimal blood loss. UOP over 24 hours has been around 200cc. Currently without uremic symptoms. BUN/Cr stable. - Continue to monitor  - Likely discharge today [place]  2. Metabolic Bone Disease:  - Continue Calcitriol  3. Metabolic Acidosis: Mild with Bicarb of 21. Asymptomatic  - Continue to monitor  -Resume sodium bicarb 650 mg BID   4. Anemia of CKD: Remains asymptomatic, H/H stable  - Continue to monitor. No indication of Epo presently   5. Type 2 DM: No evidence of hypoglycemia or overt hyperglycemia since admission -Continue insulin   6. Hypothyroidism: -Continue synthroid  7. Benign prostatic hyperplasia;urinary retention - Will likely benefit from Foley catheter placement and follow-up with urology - Continue Flomax

## 2019-01-28 NOTE — Discharge Summary (Signed)
Physician Discharge Summary  Frank [name] BOF:[id number] DOB: [birth year] DOA: 01/21/2019  PCP: Frank Holstein, MD  Admit date: 01/21/2019 Discharge date: 01/28/2019  Time spent: 40 minutes  Recommendations for Outpatient Follow-up:  1. Follow up outpatient CBC/CMP 2. Follow up with outpatient nephrologist and outpatient dialysis as scheduled, T/TH/Sat 3. Follow up removal of tunneled dialysis catheter when able to use graft 4. Pt discharged with foley catheter due to recurrent urinary retention, ensure follow up with urology as outpatient 5. 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)   Hypothyroidism   Elevated troponin   Discharge Condition: stable  Diet recommendation: renal   Filed Weights   01/28/19 0306 01/28/19 1120 01/28/19 1527  Weight: 86.2 kg 90.6 kg 89.3 kg    History of present illness:  HPI On 01/21/2019 by Frank [name] 78 y.o.malewithhistory of chronic kidney disease stage V with left arm AV fistula follows with Frank [name] nephrologist per patient's daughter who provided history to the ER physician has been having intermittent nausea vomiting last few days and had followed up with his primary care physician who drew labs and found his creatinine be significantly elevated. The labs available in care everywhere shows creatinine of 4.8 in January 2020. In addition patient has been having some near syncopal episodes since this morning. His vomiting is stopped denies any diarrhea chest pain shortness of breath.  Interim history Admitted with near syncopal event.  Also found to have progressive kidney disease and started on  dialysis.  His Left AV fistula was occluded and he was seen by vascular surgery with trhomectomy of L upper arm AV fistula, left arm AV fistulogram, and left upper arm graft 4-7.  He was discharged 5/20 with plans to start dialysis outpatient.  He had foley catheter placed due to recurrent retention.   Hospital Course:  Acute kidney injury on chronic kidney disease, stage V-possibly progressive with uremia -Follows with Frank [name], nephrology -Per Care everywhere, in January creatinine was 4.82 -Creatinine peaked to 6.49 -Patient is able to produce urine but states it has painful at times and dark in color -had had forgetfulness as well as is her nausea and dysphagia -Nephrology consulted and appreciated. Pt started on dialysis during this hospitalization.  -patient does have Frank [name] LUE AVF in place, but possibly not functional- vascular surgery consulted for occlusion of L upper arm AV fistula with thrombectomy of L upper arm AV fistula, left arm AV fistulogram, and L upper arm graft.   - Will need TDC removal once graft can be accessed without problems (after 4 weeks) - S/p US guided insertion of palindrome catheter. -Of note, patient does have Frank [name] HD slot at [facility] on TTS at 10:15 AM with report to clinic at 9:30 AM on the first day  NearSyncope -Patient feels he was dehydrated-as he states he had vomiting prior to admission -Denies any current dizziness -COVID negative -Orthostatic vitals positive drop from supine to standing -Echocardiogram EF 60 to 65%.  LV diastolic parameters consistent with impaired relaxation. -PT consulted no further therapy needed. - Pt notes he feels improved after he started dialysis  Elevated troponin -Suspect  secondary to worsening renal function -Does not appear to be consistent with ACS, patient also not having any chest pain -Echocardiogram as above  Hypokalemia -Resolved, continue to monitor BMP  Hypothyroidism -Continue  Synthroid  Diabetes mellitus, type II -Appears to be diet controlled as patient is not on any home medications -Last hemoglobin A1c was 6.6 at discharge.  Continue to follow outpatient  Chronic pain -Continue home regimen  BPH/ urinary retention -Continue Flomax -> increase dose -patient continues to required in/out catheter in the evening -Pt with minimal post void this AM, but this afternoon >200 in setting of attempting to urinate.  Due to continued symptoms and retention, foley catheter was placed, will need outpatient follow up with urology  Sinus Tachycardia: This seems to have occurred last night and this afternoon after dialysis.  Improved earlier during day today.  EKG with HR in 90's.  Seems to have been worse when pt standing, trying to urinate, possible worsened with this effort + discomfort.  Of note, EKG notable for first degree heart block, possible second degree type 1 which he follows with Frank [name] for.  Follow outpatient.    Procedures: Echo IMPRESSIONS    1. The left ventricle has normal systolic function with an ejection fraction of 60-65%. The cavity size was normal. There is moderate concentric left ventricular hypertrophy. Left ventricular diastolic Doppler parameters are consistent with impaired  relaxation.  2. The right ventricle has normal systolic function. The cavity was normal. There is no increase in right ventricular wall thickness.  3. Left atrial size was mildly dilated.  4. The mitral valve is degenerative. Mild thickening of the mitral valve leaflet. There is mild mitral annular calcification present.  5. The aortic valve is tricuspid. Mild thickening of the aortic valve. Mild calcification of the aortic valve. Aortic valve regurgitation was not assessed by color flow Doppler.   US guided insertion of palindrome catheter on 5/15  Consultations:  Nephrology  Vascular surgery  Discharge Exam: Vitals:   01/28/19 1527 01/28/19 1551  BP:  (!) 153/60 139/83  Pulse: (!) 58 95  Resp:  16  Temp: 98.6 F (37 C) 98.2 F (36.8 C)  SpO2: 95% 99%   Feels better than presentation. Asking about pain meds. Discussed with daughter over phone.  General: No acute distress. Cardiovascular: Heart sounds show Daimien Patmon regular rate, and rhythm.  Lungs: Clear to auscultation bilaterally  Abdomen: Soft, nontender, nondistended  Neurological: Alert and oriented 3. Moves all extremities 4. Cranial nerves II through XII grossly intact. Skin: Warm and dry. No rashes or lesions. Extremities: No clubbing or cyanosis. No edema.  Psychiatric: Mood and affect are normal. Insight and judgment are appropriate.  Discharge Instructions   Discharge Instructions    Call MD for:  difficulty breathing, headache or visual disturbances   Complete by:  As directed    Call MD for:  extreme fatigue   Complete by:  As directed    Call MD for:  hives   Complete by:  As directed    Call MD for:  persistant dizziness or light-headedness   Complete by:  As directed    Call MD for:  persistant nausea and vomiting   Complete by:  As directed    Call MD for:  redness, tenderness, or signs of infection (pain, swelling, redness, odor or green/yellow discharge around incision site)   Complete by:  As directed    Call MD for:  severe uncontrolled pain   Complete by:  As directed    Call MD for:  temperature >100.4   Complete by:  As directed    Diet - low sodium heart healthy   Complete by:  As directed    Discharge instructions   Complete by:  As directed    You were seen for near syncope (fainting) and the need to start dialysis.  They placed Angellica Maddison tunneled dialysis catheter and you had Cecia Egge left AV graft placed.  The graft can be used in 4 weeks.  You'll keep the tunneled dialysis catheter until the graft can be used without issue.  Please follow outpatient with renal for dialysis as scheduled (Tuesday, Thursday, and Saturday).  We've increased your flomax to  0.8 mg daily.  You had some issues with urinary retention while you were here, we placed Chau Savell foley due to recurrent urinary retention prior to discharge.  Please follow up with urology as an outpatient.  Your platelets were slightly low, please follow up this as an outpatient.  Return for new, recurrent, or worsening symptoms.  Please ask your PCP to request records from this hospitalization so they know what was done and what the next steps will be.   Increase activity slowly   Complete by:  As directed      Allergies as of 01/28/2019      Reactions   Ace Inhibitors Other (See Comments)   Hyperkalemia on ACE INHIBITORS Cr>1.9   Angiotensin Receptor Blockers Other (See Comments)   Hyperkalemia Elevates Cr > 1.9   Ivp Dye [iodinated Diagnostic Agents] Other (See Comments)   Cannot have due to renal failure   Adhesive [tape] Other (See Comments)   UNDESCRIBED REACTION Can tolerate ONLY Coban wrap or mild paper tape!!      Medication List    STOP taking these medications   furosemide 20 MG tablet Commonly known as:  LASIX     TAKE these medications   allopurinol 100 MG tablet Commonly known as:  ZYLOPRIM Take 100 mg by mouth daily.   aspirin EC 81 MG tablet Take 81 mg daily by mouth.   atorvastatin 20 MG tablet Commonly known as:  LIPITOR Take 20 mg daily by mouth.   BALMEX EX Apply 1 application topically daily as needed (to irritated areas of skin).   calcitRIOL 0.25 MCG capsule Commonly known as:  ROCALTROL Take 0.25 mcg by mouth daily.   docusate sodium 100 MG capsule Commonly known as:  COLACE Take 200 mg by mouth 2 (two) times daily.   gabapentin 100 MG capsule Commonly known as:  NEURONTIN Take 300 mg by mouth daily at 3 pm.   HYDROcodone-acetaminophen 10-325 MG tablet Commonly known as:  NORCO Take 0.5 tablets by mouth See admin instructions. Take 0.5 tablet by mouth up to 6 times Mena Lienau day as needed for pain   HYDROcodone-acetaminophen 5-325 MG  tablet Commonly known as:  Norco Take 1 tablet by mouth every 6 (six) hours as needed for moderate pain.   levothyroxine 150 MCG tablet Commonly known as:  SYNTHROID Take 150 mcg by mouth daily before breakfast.   multivitamin Tabs tablet Take 1 tablet daily by mouth.   ondansetron 4 MG tablet Commonly known as:  ZOFRAN Take 4 mg by mouth every 8 (eight) hours as needed for nausea or vomiting.   oxyCODONE 5 MG immediate release tablet Commonly known as:  Oxy IR/ROXICODONE Take 5 mg by mouth every 4 (four) hours as needed (for pain).   sodium bicarbonate 650 MG tablet  Take 650 mg by mouth 2 (two) times daily.   tamsulosin 0.4 MG Caps capsule Commonly known as:  FLOMAX Take 2 capsules (0.8 mg total) by mouth daily for 30 days. What changed:  how much to take      Allergies  Allergen Reactions  . Ace Inhibitors Other (See Comments)    Hyperkalemia on ACE INHIBITORS Cr>1.9  . Angiotensin Receptor Blockers Other (See Comments)    Hyperkalemia Elevates Cr > 1.9   . Ivp Dye [Iodinated Diagnostic Agents] Other (See Comments)    Cannot have due to renal failure  . Adhesive [Tape] Other (See Comments)    UNDESCRIBED REACTION Can tolerate ONLY Coban wrap or mild paper tape!!   Follow-up Information    [name], MD Follow up in 2 week(s).   Specialties:  Vascular Surgery, Cardiology Why:  office will call Contact information: [place] [address] [phone]        Frank Holstein, MD Follow up.   Specialty:  Family Medicine       Edrick Oh, MD Follow up.   Specialty:  Nephrology Contact information: [place] [address] [phone]        Frank Holstein, MD.   Specialty:  Family Medicine Why:  office will call/closed        [facility] Follow up.   Why:  Call for follow up for urinary retention Contact information: [place] [phone]            The results of significant diagnostics from this hospitalization (including imaging, microbiology, ancillary and laboratory) are listed below for reference.    Significant Diagnostic Studies: Dg Chest 1 View  Result Date: 01/23/2019 CLINICAL DATA:  New right-sided dialysis catheter. EXAM: CHEST  1 VIEW COMPARISON:  Chest x-ray dated Jan 21, 2019. FINDINGS: Interval placement of Demetra Moya tunneled right internal jugular dialysis catheter with the tip in the proximal right atrium. The heart size and mediastinal contours are within normal limits. Normal pulmonary vascularity. Low lung volumes. No focal consolidation, pleural effusion, or pneumothorax. No acute osseous abnormality. IMPRESSION: 1. New tunneled right internal jugular dialysis catheter without complicating feature. Electronically Signed   By: Titus Dubin M.D.   On: 01/23/2019 15:25   US Renal  Result Date: 01/26/2019 CLINICAL DATA:  78 year old male with urinary retention. EXAM: RENAL / URINARY TRACT ULTRASOUND COMPLETE COMPARISON:  Abdomen MRI 09/13/2010. FINDINGS: Right Kidney: Renal measurements: 8.3 x 5.3 x 4.2 centimeters = volume: 97 mL. Normal cortical echogenicity. No hydronephrosis or right renal mass. Left Kidney: Renal measurements: 9.4 x 4.9 x 4.4 centimeters = volume: 105 mL. Normal cortical echogenicity. No left hydronephrosis or renal mass. Bladder: Appears normal for degree of bladder distention. Prostate hypertrophy, measuring Other findings: 43 mm diameter. IMPRESSION: 1. Normal for age ultrasound appearance of the kidneys. 2. Negative urinary bladder.  Prostate hypertrophy. Electronically Signed   By: Genevie Ann M.D.   On: 01/26/2019 22:43   Dg Chest Port 1 View  Result Date: 01/21/2019 CLINICAL DATA:  Generalized weakness EXAM: PORTABLE CHEST 1 VIEW COMPARISON:  None. FINDINGS: Elevated right diaphragm with mild right lower lobe atelectasis. Lungs otherwise clear. Negative for heart failure. IMPRESSION: Mild right lower  lobe atelectasis. Electronically Signed   By: Franchot Gallo M.D.   On: 01/21/2019 17:36   Dg Ang/ext/uni/or Left  Result Date: 01/27/2019 CLINICAL DATA:  Intraoperative venogram. EXAM: LEFT ANG/EXT/UNI/ OR CONTRAST:  See operative report FLUOROSCOPY TIME:  Fluoroscopy Time:  13 seconds Number of Acquired Spot Images: 1 COMPARISON:  None. FINDINGS: Single angiographic image was obtained from the left upper extremity. Filling of multiple small veins in the left upper arm. There is Katilin Raynes large focus of contrast that could represent Jiraiya Mcewan thrombosed vein or extravasated contrast. IMPRESSION: Intraoperative venogram.  See operative report. Electronically Signed   By: Markus Daft M.D.   On: 01/27/2019 16:26   Dg Fluoro Guide Cv Line-no Report  Result Date: 01/23/2019 Fluoroscopy was utilized by the requesting physician.  No radiographic interpretation.    Microbiology: Recent Results (from the past 240 hour(s))  Urine Culture     Status: None   Collection Time: 01/21/19  3:51 PM  Result Value Ref Range Status   Specimen Description URINE, RANDOM  Final   Special Requests NONE  Final   Culture   Final    NO GROWTH Performed at [facility], [address], [place], [place] [address]    Report Status 01/22/2019 FINAL  Final  SARS Coronavirus 2 [facility] order, Performed in [place] hospital lab)     Status: None   Collection Time: 01/21/19  4:24 PM  Result Value Ref Range Status   SARS Coronavirus 2 NEGATIVE NEGATIVE Final    Comment: (NOTE) If result is NEGATIVE SARS-CoV-2 target nucleic acids are NOT DETECTED. The SARS-CoV-2 RNA is generally detectable in upper and lower  respiratory specimens during the acute phase of infection. The lowest  concentration of SARS-CoV-2 viral copies this assay can detect is 250  copies / mL. Kaylon Hitz negative result does not preclude SARS-CoV-2 infection  and should not be used as the sole basis for treatment or other  patient management decisions.  Deangelo Berns negative  result may occur with  improper specimen collection / handling, submission of specimen other  than nasopharyngeal swab, presence of viral mutation(s) within the  areas targeted by this assay, and inadequate number of viral copies  (<250 copies / mL). Keigo Whalley negative result must be combined with clinical  observations, patient history, and epidemiological information. If result is POSITIVE SARS-CoV-2 target nucleic acids are DETECTED. The SARS-CoV-2 RNA is generally detectable in upper and lower  respiratory specimens dur ing the acute phase of infection.  Positive  results are indicative of active infection with SARS-CoV-2.  Clinical  correlation with patient history and other diagnostic information is  necessary to determine patient infection status.  Positive results do  not rule out bacterial infection or co-infection with other viruses. If result is PRESUMPTIVE POSTIVE SARS-CoV-2 nucleic acids MAY BE PRESENT.   Leesa Leifheit presumptive positive result was obtained on the submitted specimen  and confirmed on repeat testing.  While 2019 novel coronavirus  (SARS-CoV-2) nucleic acids may be present in the submitted sample  additional confirmatory testing may be necessary for epidemiological  and / or clinical management purposes  to differentiate between  SARS-CoV-2 and other Sarbecovirus currently known to infect humans.  If clinically indicated additional testing with an alternate test  methodology [id number]) is advised. The SARS-CoV-2 RNA is generally  detectable in upper and lower respiratory sp ecimens during the acute  phase of infection. The expected result is Negative. Fact Sheet for Patients:  StrictlyIdeas.no Fact Sheet for Healthcare Providers: BankingDealers.co.za This test is not yet approved or cleared by the [place] FDA and has been authorized for detection and/or diagnosis of SARS-CoV-2 by FDA under an Emergency Use Authorization  (EUA).  This EUA  will remain in effect (meaning this test can be used) for the duration of the COVID-19 declaration under Section 564(b)(1) of the Act, 21 U.S.C. section 360bbb-3(b)(1), unless the authorization is terminated or revoked sooner. Performed at [facility], [place] [address], [place], [place] [address]   MRSA PCR Screening     Status: None   Collection Time: 01/24/19 10:10 PM  Result Value Ref Range Status   MRSA by PCR NEGATIVE NEGATIVE Final    Comment:        The GeneXpert MRSA Assay (FDA approved for NASAL specimens only), is one component of Shakeel Disney comprehensive MRSA colonization surveillance program. It is not intended to diagnose MRSA infection nor to guide or monitor treatment for MRSA infections. Performed at [facility], [place] [address] [place],  [address]   Surgical PCR screen     Status: None   Collection Time: 01/27/19  5:10 AM  Result Value Ref Range Status   MRSA, PCR NEGATIVE NEGATIVE Final   Staphylococcus aureus NEGATIVE NEGATIVE Final    Comment: (NOTE) The Xpert SA Assay (FDA approved for NASAL specimens in patients 40 years of age and older), is one component of Darnel Mchan comprehensive surveillance program. It is not intended to diagnose infection nor to guide or monitor treatment. Performed at [facility], [place] [address] [place],  [address]      Labs: Basic Metabolic Panel: Recent Labs  Lab 01/22/19 1044 01/23/19 0349 01/24/19 0401 01/25/19 0525 01/26/19 0549 01/27/19 0521 01/28/19 0437  NA  --  141 143 141 140 141 141  K  --  3.1* 3.6 3.5 3.9 4.0 4.9  CL  --  104 106 102 106 105 107  CO2  --  24 24 26 24 26  21*  GLUCOSE  --  192* 182* 141* 216* 147* 247*  BUN  --  114* 79* 48* 62* 54* 61*  CREATININE  --  6.25* 4.58* 3.94* 5.07* 4.44* 4.57*  CALCIUM  --  8.0* 8.0* 8.4* 8.3* 8.5* 8.5*  MG 1.9  --   --  1.8  --   --   --   PHOS  --  5.0* 4.1 3.9 3.4  --  5.2*   Liver Function Tests: Recent Labs  Lab  01/22/19 0541 01/23/19 0349 01/24/19 0401 01/25/19 0525 01/26/19 0549 01/28/19 0437  AST 48*  --   --   --   --   --   ALT 27  --   --   --   --   --   ALKPHOS 68  --   --   --   --   --   BILITOT 0.5  --   --   --   --   --   PROT 6.4*  --   --   --   --   --   ALBUMIN 3.0* 2.8* 2.8* 2.9* 2.9* 2.6*   No results for input(s): LIPASE, AMYLASE in the last 168 hours. No results for input(s): AMMONIA in the last 168 hours. CBC: Recent Labs  Lab 01/24/19 0401 01/25/19 0525 01/26/19 0549 01/27/19 0521 01/28/19 0437  WBC 10.2 10.9* 10.6* 9.5 10.6*  HGB 10.9* 11.2* 11.0* 10.6* 10.4*  HCT 32.8* 34.5* 34.5* 32.3* 32.6*  MCV 94.0 95.8 96.6 96.7 98.2  PLT 144* 136* 144* 125* 113*   Cardiac Enzymes: Recent Labs  Lab 01/21/19 1841 01/21/19 2304 01/22/19 0541 01/22/19 1044  TROPONINI 0.08* 0.09* 0.10* 0.09*  BNP: BNP (last 3 results) No results for input(s): BNP in the last 8760 hours.  ProBNP (last 3 results) No results for input(s): PROBNP in the last 8760 hours.  CBG: Recent Labs  Lab 01/27/19 1159 01/27/19 1713 01/27/19 2048 01/28/19 0541 01/28/19 1552  GLUCAP 145* 151* 162* 205* 115*       Signed:  Fayrene Helper MD.  [facility] 01/28/2019, 5:48 PM

## 2019-01-28 NOTE — Progress Notes (Signed)
[facility]  Subjective  - Soreness in the left UE.   Objective (!) 122/56 73 97.7 F (36.5 C) (Oral) 20 98%  Intake/Output Summary (Last 24 hours) at 01/28/2019 0754 Last data filed at 01/28/2019 0636 Gross per 24 hour  Intake 1700 ml  Output 200 ml  Net 1500 ml    Left UE palpable radial pulse, ecchymosis and tenderness over graft.  Palpable thrill in left AV graft   Assessment/Planning: POD # 1 placement of left UE AV graft 01/23/2019 right TDC placed.  The av graft may be used in 4 weeks.  Once graft is accessed without problems he can be scheduled for [facility] removal. F/U PRN with DR. Fields.  Roxy Horseman 01/28/2019 7:54 AM --  Laboratory Lab Results: Recent Labs    01/27/19 0521 01/28/19 0437  WBC 9.5 10.6*  HGB 10.6* 10.4*  HCT 32.3* 32.6*  PLT 125* 113*   BMET Recent Labs    01/27/19 0521 01/28/19 0437  NA 141 141  K 4.0 4.9  CL 105 107  CO2 26 21*  GLUCOSE 147* 247*  BUN 54* 61*  CREATININE 4.44* 4.57*  CALCIUM 8.5* 8.5*    COAG Lab Results  Component Value Date   INR 1.1 01/27/2019   No results found for: PTT

## 2019-02-23 ENCOUNTER — Other Ambulatory Visit: Payer: Self-pay

## 2019-02-23 ENCOUNTER — Ambulatory Visit (INDEPENDENT_AMBULATORY_CARE_PROVIDER_SITE_OTHER): Payer: Self-pay | Admitting: Physician Assistant

## 2019-02-23 VITALS — BP 147/49 | HR 82 | Temp 97.8°F | Resp 14 | Ht 67.0 in | Wt 198.2 lb

## 2019-02-23 DIAGNOSIS — N186 End stage renal disease: Secondary | ICD-10-CM

## 2019-02-23 DIAGNOSIS — Z992 Dependence on renal dialysis: Secondary | ICD-10-CM

## 2019-02-23 NOTE — Progress Notes (Signed)
POST OPERATIVE OFFICE NOTE    CC:  F/u for surgery  HPI:  This is a 78 y.o. male who is s/p attempted Thrombectomy left upper arm AV fistula, left arm AV fistulogram, left upper arm graft 4-7.  He has been on HD via [facility] placement right side by Dr. Oneida Alar on 01/23/2019.  He is here for f/u visit to examine the graft for function.  He denise pain, weakness and numbness in the left UE.  Allergies  Allergen Reactions  . Ace Inhibitors Other (See Comments)    Hyperkalemia on ACE INHIBITORS Cr>1.9  . Angiotensin Receptor Blockers Other (See Comments)    Hyperkalemia Elevates Cr > 1.9   . Ivp Dye [Iodinated Diagnostic Agents] Other (See Comments)    Cannot have due to renal failure  . Adhesive [Tape] Other (See Comments)    UNDESCRIBED REACTION Can tolerate ONLY Coban wrap or mild paper tape!!    Current Outpatient Medications  Medication Sig Dispense Refill  . allopurinol (ZYLOPRIM) 100 MG tablet Take 100 mg by mouth daily.     Marland Kitchen aspirin EC 81 MG tablet Take 81 mg daily by mouth.    Marland Kitchen atorvastatin (LIPITOR) 20 MG tablet Take 20 mg daily by mouth.    . calcitRIOL (ROCALTROL) 0.25 MCG capsule Take 0.25 mcg by mouth daily.    Marland Kitchen docusate sodium (COLACE) 100 MG capsule Take 200 mg by mouth 2 (two) times daily.    Marland Kitchen gabapentin (NEURONTIN) 100 MG capsule Take 300 mg by mouth daily at 3 pm.     . HYDROcodone-acetaminophen (NORCO) 10-325 MG tablet Take 0.5 tablets by mouth See admin instructions. Take 0.5 tablet by mouth up to 6 times a day as needed for pain    . HYDROcodone-acetaminophen (NORCO) 5-325 MG tablet Take 1 tablet by mouth every 6 (six) hours as needed for moderate pain. 12 tablet 0  . levothyroxine (SYNTHROID) 150 MCG tablet Take 150 mcg by mouth daily before breakfast.    . multivitamin (RENA-VIT) TABS tablet Take 1 tablet daily by mouth.    . ondansetron (ZOFRAN) 4 MG tablet Take 4 mg by mouth every 8 (eight) hours as needed for nausea or vomiting.     Marland Kitchen oxyCODONE (OXY  IR/ROXICODONE) 5 MG immediate release tablet Take 5 mg by mouth every 4 (four) hours as needed (for pain).     . sodium bicarbonate 650 MG tablet Take 650 mg by mouth 2 (two) times daily.    . tamsulosin (FLOMAX) 0.4 MG CAPS capsule Take 2 capsules (0.8 mg total) by mouth daily for 30 days. 60 capsule 0  . Zinc Oxide (BALMEX EX) Apply 1 application topically daily as needed (to irritated areas of skin).      No current facility-administered medications for this visit.      ROS:  See HPI  Physical Exam:  Left UE with palpable thrill in the graft, hand with palpable radial pulse, grip 5/5 and sensation grossly intact and equal B UE.    Assessment/Plan:  This is a 78 y.o. male who is s/p: Left UE AV graft  The incisions are well healed and the graft has a palpable thrill. I spoke on the phone with his daughter [name] and relayed that the graft is patent and doing well without problems. The graft may be used as of 03/03/2019.  Once it is successful he will be scheduled for [facility] removal.  F/U PRN   Roxy Horseman  PA-C [facility] [phone]  Clinic MD:  Trula Slade

## 2019-03-16 ENCOUNTER — Telehealth (INDEPENDENT_AMBULATORY_CARE_PROVIDER_SITE_OTHER): Payer: Self-pay

## 2019-03-16 NOTE — Telephone Encounter (Signed)
A fax was received from [facility] for the patient to have a permcath removed. Patient has been scheduled with Dr. Lucky Cowboy for 03/23/2019 with a 1:00 pm arrival time and will do Covid-19 testing on Friday 03/20/2019 between 12:30-2:30pm. This information will be faxed back to [facility] attn: [place].

## 2019-03-18 ENCOUNTER — Other Ambulatory Visit (INDEPENDENT_AMBULATORY_CARE_PROVIDER_SITE_OTHER): Payer: Self-pay | Admitting: Nurse Practitioner

## 2019-03-18 NOTE — Telephone Encounter (Signed)
I spoke with the patient's daughter and gave her the information regarding the patient's permcath removal on 03/23/2019 with arrival time of 1:00 pm and his Covid test on Friday 03/20/2019 between 12:30-2:30 pm.

## 2019-03-19 ENCOUNTER — Other Ambulatory Visit: Admission: RE | Admit: 2019-03-19 | Payer: Medicare Other | Source: Ambulatory Visit

## 2019-03-20 ENCOUNTER — Other Ambulatory Visit
Admission: RE | Admit: 2019-03-20 | Discharge: 2019-03-20 | Disposition: A | Discharge status: Home / Self Care | Payer: Medicare Other | Source: Ambulatory Visit | Attending: Vascular Surgery | Admitting: Vascular Surgery

## 2019-03-20 ENCOUNTER — Other Ambulatory Visit: Payer: Self-pay

## 2019-03-20 DIAGNOSIS — Z01812 Encounter for preprocedural laboratory examination: Secondary | ICD-10-CM | POA: Diagnosis present

## 2019-03-20 DIAGNOSIS — Z1159 Encounter for screening for other viral diseases: Secondary | ICD-10-CM | POA: Insufficient documentation

## 2019-03-21 LAB — SARS CORONAVIRUS 2 (TAT 6-24 HRS): SARS Coronavirus 2: NEGATIVE

## 2019-03-22 MED ORDER — CEFAZOLIN SODIUM-DEXTROSE 1-4 GM/50ML-% IV SOLN: 1.0000 g | Freq: Once | INTRAVENOUS | Status: DC

## 2019-03-23 ENCOUNTER — Telehealth (INDEPENDENT_AMBULATORY_CARE_PROVIDER_SITE_OTHER): Payer: Self-pay

## 2019-03-23 ENCOUNTER — Encounter
Admission: RE | Disposition: A | Discharge status: Home / Self Care | Payer: Self-pay | Source: Home / Self Care | Attending: Vascular Surgery

## 2019-03-23 ENCOUNTER — Ambulatory Visit
Admission: RE | Admit: 2019-03-23 | Discharge: 2019-03-23 | Disposition: A | Discharge status: Home / Self Care | Payer: Medicare Other | Attending: Vascular Surgery | Admitting: Vascular Surgery

## 2019-03-23 DIAGNOSIS — Z992 Dependence on renal dialysis: Secondary | ICD-10-CM

## 2019-03-23 DIAGNOSIS — E039 Hypothyroidism, unspecified: Secondary | ICD-10-CM | POA: Insufficient documentation

## 2019-03-23 DIAGNOSIS — I120 Hypertensive chronic kidney disease with stage 5 chronic kidney disease or end stage renal disease: Secondary | ICD-10-CM | POA: Insufficient documentation

## 2019-03-23 DIAGNOSIS — E1140 Type 2 diabetes mellitus with diabetic neuropathy, unspecified: Secondary | ICD-10-CM | POA: Diagnosis not present

## 2019-03-23 DIAGNOSIS — E1122 Type 2 diabetes mellitus with diabetic chronic kidney disease: Secondary | ICD-10-CM | POA: Insufficient documentation

## 2019-03-23 DIAGNOSIS — Z87891 Personal history of nicotine dependence: Secondary | ICD-10-CM | POA: Insufficient documentation

## 2019-03-23 DIAGNOSIS — E1151 Type 2 diabetes mellitus with diabetic peripheral angiopathy without gangrene: Secondary | ICD-10-CM | POA: Insufficient documentation

## 2019-03-23 DIAGNOSIS — M109 Gout, unspecified: Secondary | ICD-10-CM | POA: Diagnosis not present

## 2019-03-23 DIAGNOSIS — E785 Hyperlipidemia, unspecified: Secondary | ICD-10-CM | POA: Diagnosis not present

## 2019-03-23 DIAGNOSIS — N2581 Secondary hyperparathyroidism of renal origin: Secondary | ICD-10-CM | POA: Diagnosis not present

## 2019-03-23 DIAGNOSIS — N186 End stage renal disease: Secondary | ICD-10-CM

## 2019-03-23 DIAGNOSIS — Z452 Encounter for adjustment and management of vascular access device: Secondary | ICD-10-CM

## 2019-03-23 HISTORY — PX: DIALYSIS/PERMA CATHETER REMOVAL: CATH118289

## 2019-03-23 SURGERY — DIALYSIS/PERMA CATHETER REMOVAL
Anesthesia: LOCAL

## 2019-03-23 MED ORDER — ONDANSETRON HCL 4 MG/2ML IJ SOLN: 4.0000 mg | Freq: Four times a day (QID) | INTRAMUSCULAR | Status: DC | PRN

## 2019-03-23 MED ORDER — LIDOCAINE HCL (PF) 1 % IJ SOLN
INTRAMUSCULAR | Status: DC | PRN
  Administered 2019-03-23: 30 mL via INTRADERMAL

## 2019-03-23 MED ORDER — SODIUM CHLORIDE 0.9 % IV SOLN: INTRAVENOUS | Status: DC

## 2019-03-23 MED ORDER — HYDROMORPHONE HCL 1 MG/ML IJ SOLN: 1.0000 mg | Freq: Once | INTRAMUSCULAR | Status: DC | PRN

## 2019-03-23 SURGICAL SUPPLY — 3 items
FORCEPS HALSTEAD CVD 5IN STRL (INSTRUMENTS) ×2 IMPLANT
TOWEL OR 17X26 4PK STRL BLUE (TOWEL DISPOSABLE) ×2 IMPLANT
TRAY LACERAT/PLASTIC (MISCELLANEOUS) ×2 IMPLANT

## 2019-03-23 NOTE — Telephone Encounter (Signed)
Spoke with the patient's daughter about bringing the patient in sooner to do his procedure. The daughter stated she would have to go get him and that he is dealing with cognitive issues so it may be a little while.

## 2019-03-23 NOTE — H&P (Signed)
[facility] Admission History & Physical  MRN : [id number]  Frank [name] is a 77 y.o. (07/27/1941) male who presents with chief complaint of No chief complaint on file. Marland Kitchen  History of Present Illness: I am asked to evaluate the patient by the dialysis center. The patient was sent here because they have a nonfunctioning tunneled catheter and a functioning left arm AVG.  The patient reports they're not been any problems with any of their dialysis runs. They are reporting good flows with good parameters at dialysis.  Patient denies pain or tenderness overlying the access.  There is no pain with dialysis.  The patient denies hand pain or finger pain consistent with steal syndrome.  No fevers or chills while on dialysis.   Current Facility-Administered Medications  Medication Dose Route Frequency Provider Last Rate Last Dose  . 0.9 %  sodium chloride infusion   Intravenous Continuous Eulogio Ditch E, NP      . ceFAZolin (ANCEF) IVPB 1 g/50 mL premix  1 g Intravenous Once Eulogio Ditch E, NP      . HYDROmorphone (DILAUDID) injection 1 mg  1 mg Intravenous Once PRN Kris Hartmann, NP      . ondansetron (ZOFRAN) injection 4 mg  4 mg Intravenous Q6H PRN Kris Hartmann, NP        Past Medical History:  Diagnosis Date  . Anemia of chronic kidney failure   . Bradycardia   . Chronic kidney disease    [facility]  . Diabetes mellitus without complication (HCC)    diet controlled   . Diabetic nephropathy ([place])   . Gout   . Gout   . Hyperlipidemia   . Hypothyroidism   . Neuropathy   . Peripheral neuropathy   . Peripheral vascular disease ([place])   . Secondary hyperparathyroidism ([place])    Renal  . Thyroid disease     Past Surgical History:  Procedure Laterality Date  . AV FISTULA PLACEMENT Left 11/19/2017   Procedure: ARTERIOVENOUS (AV) FISTULA CREATION;  Surgeon: [name], MD;  Location: [place];  Service: Vascular;  Laterality: Left;  .  BASCILIC VEIN TRANSPOSITION Left 06/04/2018   Procedure: BASILIC VEIN TRANSPOSITION ARM;  Surgeon: [name], MD;  Location: [place];  Service: Vascular;  Laterality: Left;  . COLONOSCOPY    . EYE SURGERY     cataract surgery  . INSERTION OF DIALYSIS CATHETER N/A 01/23/2019   Procedure: INSERTION OF DIALYSIS CATHETER;  Surgeon: [name], MD;  Location: [facility] OR;  Service: Vascular;  Laterality: N/A;  INSERTION OF DIALYSIS CATHETER  . KNEE ARTHROSCOPY Left    Knee  . THROMBECTOMY W/ EMBOLECTOMY Left 01/27/2019   Procedure: THROMBECTOMY Left arm ARTERIOVENOUS FISTULA  and Left arm Arteriovenous gortex graft;  Surgeon: [name], MD;  Location: [place];  Service: Vascular;  Laterality: Left;  Marland Kitchen VENOGRAM Left 01/27/2019   Procedure: Left arm FISTULOGRAM/;  Surgeon: [name], MD;  [facility] OR;  Service: Vascular;  Laterality: Left;    Social History Social History   Tobacco Use  . Smoking status: Former Smoker    Years: 30.00    Quit date: 1994    Years since quitting: 26.5  . Smokeless tobacco: Never Used  Substance Use Topics  . Alcohol use: No  . Drug use: No    Family History Family History  Problem Relation Age of Onset  . Diabetes Mother   . Kidney disease Mother   .  Liver disease Mother   . Heart Problems Mother     No family history of bleeding or clotting disorders, autoimmune disease or porphyria  Allergies  Allergen Reactions  . Ace Inhibitors Other (See Comments)    Hyperkalemia on ACE INHIBITORS Cr>1.9  . Angiotensin Receptor Blockers Other (See Comments)    Hyperkalemia Elevates Cr > 1.9   . Ivp Dye [Iodinated Diagnostic Agents] Other (See Comments)    Cannot have due to renal failure  . Adhesive [Tape] Other (See Comments)    UNDESCRIBED REACTION Can tolerate ONLY Coban wrap or mild paper tape!!     REVIEW OF SYSTEMS (Negative unless checked)  Constitutional: [] Weight loss  [] Fever  [] Chills Cardiac: [] Chest pain    [] Chest pressure   [] Palpitations   [] Shortness of breath when laying flat   [] Shortness of breath at rest   [x] Shortness of breath with exertion. Vascular:  [] Pain in legs with walking   [] Pain in legs at rest   [] Pain in legs when laying flat   [] Claudication   [] Pain in feet when walking  [] Pain in feet at rest  [] Pain in feet when laying flat   [] History of DVT   [] Phlebitis   [] Swelling in legs   [] Varicose veins   [] Non-healing ulcers Pulmonary:   [] Uses home oxygen   [] Productive cough   [] Hemoptysis   [] Wheeze  [] COPD   [] Asthma Neurologic:  [] Dizziness  [] Blackouts   [] Seizures   [] History of stroke   [] History of TIA  [] Aphasia   [] Temporary blindness   [] Dysphagia   [] Weakness or numbness in arms   [] Weakness or numbness in legs Musculoskeletal:  [x] Arthritis   [] Joint swelling   [] Joint pain   [] Low back pain Hematologic:  [] Easy bruising  [] Easy bleeding   [] Hypercoagulable state   [x] Anemic  [] Hepatitis Gastrointestinal:  [] Blood in stool   [] Vomiting blood  [] Gastroesophageal reflux/heartburn   [] Difficulty swallowing. Genitourinary:  [x] Chronic kidney disease   [] Difficult urination  [] Frequent urination  [] Burning with urination   [] Blood in urine Skin:  [] Rashes   [] Ulcers   [] Wounds Psychological:  [] History of anxiety   []  History of major depression.  Physical Examination  There were no vitals filed for this visit. There is no height or weight on file to calculate BMI. Gen: WD/WN, NAD Head: [place]/AT, No temporalis wasting.  Ear/Nose/Throat: Hearing grossly intact, nares w/o erythema or drainage, oropharynx w/o Erythema/Exudate,  Eyes: Conjunctiva clear, sclera non-icteric Neck: Trachea midline.  No JVD.  Pulmonary:  Good air movement, respirations not labored, no use of accessory muscles.  Cardiac: RRR, normal S1, S2. Vascular: good thrill left arm AVG Vessel Right Left  Radial Palpable Palpable    Musculoskeletal: M/S 5/5 throughout.  Extremities without ischemic changes.   No deformity or atrophy.  Neurologic: Sensation grossly intact in extremities.  Symmetrical.  Speech is fluent. Motor exam as listed above. Psychiatric: Judgment intact, Mood & affect appropriate for pt's clinical situation. Dermatologic: No rashes or ulcers noted.  No cellulitis or open wounds.    CBC Lab Results  Component Value Date   WBC 10.6 (H) 01/28/2019   HGB 10.4 (L) 01/28/2019   HCT 32.6 (L) 01/28/2019   MCV 98.2 01/28/2019   PLT 113 (L) 01/28/2019    BMET    Component Value Date/Time   NA 141 01/28/2019 0437   K 4.9 01/28/2019 0437   CL 107 01/28/2019 0437   CO2 21 (L) 01/28/2019 0437   GLUCOSE 247 (H) 01/28/2019 7741  BUN 61 (H) 01/28/2019 0437   CREATININE 4.57 (H) 01/28/2019 0437   CALCIUM 8.5 (L) 01/28/2019 0437   GFRNONAA 12 (L) 01/28/2019 0437   GFRAA 13 (L) 01/28/2019 0437   CrCl cannot be calculated (Patient's most recent lab result is older than the maximum 21 days allowed.).  COAG Lab Results  Component Value Date   INR 1.1 01/27/2019    Radiology No results found.  Assessment/Plan 1.  Complication dialysis device with non-functional Permcath:  Patient's Tunneled catheter is not being used. The patient has an extremity access that is functioning well. Therefore, the patient will undergo removal of the tunneled catheter under local anesthesia.  The risks and benefits were described to the patient.  All questions were answered.  The patient agrees to proceed with angiography and intervention. Potassium will be drawn to ensure that it is an appropriate level prior to performing intervention. 2.  End-stage renal disease requiring hemodialysis:  Patient will continue dialysis therapy without further interruption 3.  Hypertension:  Patient will continue medical management; nephrology is following no changes in oral medications. 4. Diabetes mellitus:  Glucose will be monitored and oral medications been held this morning once the patient has undergone the  patient's procedure po intake will be reinitiated and again Accu-Cheks will be used to assess the blood glucose level and treat as needed. The patient will be restarted on the patient's usual hypoglycemic regime     Leotis Pain, MD  03/23/2019 11:49 AM

## 2019-03-23 NOTE — Discharge Instructions (Signed)
Tunneled Catheter Removal, Care After °Refer to this sheet in the next few weeks. These instructions provide you with information about caring for yourself after your procedure. Your health care provider may also give you more specific instructions. Your treatment has been planned according to current medical practices, but problems sometimes occur. Call your health care provider if you have any problems or questions after your procedure. °What can I expect after the procedure? °After the procedure, it is common to have: °· Some mild redness, swelling, and pain around your catheter site. ° ° °Follow these instructions at home: °Incision care  °· Check your removal site  every day for signs of infection. Check for: °¨ More redness, swelling, or pain. °¨ More fluid or blood. °¨ Warmth. °¨ Pus or a bad smell. °· Follow instructions from your health care provider about how to take care of your removal site. Make sure you: °¨ Wash your hands with soap and water before you change your bandages (dressings). If soap and water are not available, use hand sanitizer. °Activity  °· Return to your normal activities as told by your health care provider. Ask your health care provider what activities are safe for you. °· Do not lift anything that is heavier than 10 lb (4.5 kg) for 3 weeks or as long as told by your health care provider. ° °Contact a health care provider if: °· You have more fluid or blood coming from your removal site °· You have more redness, swelling, or pain at your incisions or around the area where your catheter was removed °· Your removal site feel warm to the touch. °· You feel unusually weak. °· You feel nauseous.. °· Get help right away if °· You have swelling in your arm, shoulder, neck, or face. °· You develop chest pain. °· You have difficulty breathing. °· You feel dizzy or light-headed. °· You have pus or a bad smell coming from your removal site °· You have a fever. °· You develop bleeding from your  removal site, and your bleeding does not stop. °This information is not intended to replace advice given to you by your health care provider. Make sure you discuss any questions you have with your health care provider. °Document Released: 08/13/2012 Document Revised: 04/29/2016 Document Reviewed: 05/23/2015 °Elsevier Interactive Patient Education © 2017 [employer] ° °

## 2019-03-23 NOTE — Op Note (Signed)
Operative Note     Preoperative diagnosis:   1. ESRD with functional permanent access  Postoperative diagnosis:  1. ESRD with functional permanent access  Procedure:  Removal of right jugular Permcath  Surgeon:  Leotis Pain, MD  Anesthesia:  Local  EBL:  Minimal  Indication for the Procedure:  The patient has a functional permanent dialysis access and no longer needs their permcath.  This can be removed.  Risks and benefits are discussed and informed consent is obtained.  Description of the Procedure:  The patient's right neck, chest and existing catheter were sterilely prepped and draped. The area around the catheter was anesthetized copiously with 1% lidocaine. The catheter was dissected out with curved hemostats until the cuff was freed from the surrounding fibrous sheath. The fiber sheath was transected, and the catheter was then removed in its entirety using gentle traction. Pressure was held and sterile dressings were placed. The patient tolerated the procedure well and was taken to the recovery room in stable condition.     Leotis Pain  03/23/2019, 11:58 AM This note was created with Dragon Medical transcription system. Any errors in dictation are purely unintentional.

## 2019-03-24 ENCOUNTER — Encounter: Payer: Self-pay | Admitting: Vascular Surgery

## 2019-05-08 ENCOUNTER — Other Ambulatory Visit: Payer: Self-pay

## 2019-05-08 ENCOUNTER — Encounter: Payer: Self-pay | Admitting: Internal Medicine

## 2019-05-08 ENCOUNTER — Ambulatory Visit (INDEPENDENT_AMBULATORY_CARE_PROVIDER_SITE_OTHER): Payer: Medicare Other | Admitting: Internal Medicine

## 2019-05-08 DIAGNOSIS — G4700 Insomnia, unspecified: Secondary | ICD-10-CM

## 2019-05-08 DIAGNOSIS — I2510 Atherosclerotic heart disease of native coronary artery without angina pectoris: Secondary | ICD-10-CM | POA: Diagnosis not present

## 2019-05-08 DIAGNOSIS — I7090 Unspecified atherosclerosis: Secondary | ICD-10-CM

## 2019-05-08 DIAGNOSIS — N3000 Acute cystitis without hematuria: Secondary | ICD-10-CM

## 2019-05-08 DIAGNOSIS — E559 Vitamin D deficiency, unspecified: Secondary | ICD-10-CM

## 2019-05-08 DIAGNOSIS — N185 Chronic kidney disease, stage 5: Secondary | ICD-10-CM

## 2019-05-08 DIAGNOSIS — N189 Chronic kidney disease, unspecified: Secondary | ICD-10-CM | POA: Insufficient documentation

## 2019-05-08 DIAGNOSIS — N400 Enlarged prostate without lower urinary tract symptoms: Secondary | ICD-10-CM | POA: Diagnosis not present

## 2019-05-08 DIAGNOSIS — E039 Hypothyroidism, unspecified: Secondary | ICD-10-CM

## 2019-05-08 DIAGNOSIS — D631 Anemia in chronic kidney disease: Secondary | ICD-10-CM | POA: Insufficient documentation

## 2019-05-08 DIAGNOSIS — M1990 Unspecified osteoarthritis, unspecified site: Secondary | ICD-10-CM

## 2019-05-08 DIAGNOSIS — M109 Gout, unspecified: Secondary | ICD-10-CM | POA: Insufficient documentation

## 2019-05-08 DIAGNOSIS — N2581 Secondary hyperparathyroidism of renal origin: Secondary | ICD-10-CM

## 2019-05-08 DIAGNOSIS — G8929 Other chronic pain: Secondary | ICD-10-CM

## 2019-05-08 DIAGNOSIS — N186 End stage renal disease: Secondary | ICD-10-CM

## 2019-05-08 DIAGNOSIS — E11319 Type 2 diabetes mellitus with unspecified diabetic retinopathy without macular edema: Secondary | ICD-10-CM

## 2019-05-08 DIAGNOSIS — D649 Anemia, unspecified: Secondary | ICD-10-CM

## 2019-05-08 DIAGNOSIS — E1142 Type 2 diabetes mellitus with diabetic polyneuropathy: Secondary | ICD-10-CM

## 2019-05-08 DIAGNOSIS — Z125 Encounter for screening for malignant neoplasm of prostate: Secondary | ICD-10-CM

## 2019-05-08 DIAGNOSIS — Z992 Dependence on renal dialysis: Secondary | ICD-10-CM

## 2019-05-08 DIAGNOSIS — E785 Hyperlipidemia, unspecified: Secondary | ICD-10-CM | POA: Insufficient documentation

## 2019-05-08 HISTORY — DX: Insomnia, unspecified: G47.00

## 2019-05-08 MED ORDER — GABAPENTIN 300 MG PO CAPS: 300.0000 mg | ORAL_CAPSULE | Freq: Every day | ORAL | 3 refills | Status: DC

## 2019-05-08 NOTE — Patient Instructions (Signed)
Please make sure you get the high dose flu shot in the future you may come to our clinic to get this if you would like or the pharmacy

## 2019-05-08 NOTE — Progress Notes (Signed)
Virtual Visit via Video Note  I connected with Frank [name]  on 05/08/19 at 10:30 AM EDT by a video enabled telemedicine application and verified that I am speaking with the correct person using two identifiers.  Location patient: home Location provider:work or home office Persons participating in the virtual visit: patient, provider, pts daughter Frank [name]   I discussed the limitations of evaluation and management by telemedicine and the availability of in person appointments. The patient expressed understanding and agreed to proceed.   HPI: 1. 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 Kelli [name] doing well  2. Hospitalized 01/2019 for elevated troponin and syncope no further episodes since  3. DM 2 last A1C per daughter 6.0-6.1 and pt and daughter wants labs. cbg running 100 in the am and 130s-140s to at times 150s-200s he was on Glyburide in the past 4. H/o HTN BP 116/58 and range 1 teens to 143/50s-73  5. Chronic pain especially knee pain used to be on chronic narcotics which he still has some but his daughter wishes to avoid narcotics for now though he still has some Rx left  6. H/o depression and insomnia they want to consider options to treat insomnia today instead of taking Tylenol PM    ROS: See pertinent positives and negatives per HPI. General: wt stable HEENT: +photophobia worse when watching color TV better with watching black and white TV per pt  CV: no chest pain  Lungs: no sob  GI: no abdominal pain  GU: no current issues  MSK: chronic knee pain  Neuro: no h/a  Psych:+Insomnia  Skin: no issues   Past Medical History:  Diagnosis Date  . Anemia of chronic kidney failure   . Arthritis   . Bradycardia   . Chronic kidney disease    [facility]  . Depression   . Diabetes mellitus without complication (HCC)    diet controlled   . Diabetic nephropathy ([place])   . Gout   . Gout   . History of UTI    s/p foley in hospital 01/2019   . Hyperlipidemia   . Hypothyroidism   .  Neuropathy   . Peripheral neuropathy   . Peripheral vascular disease ([place])   . Secondary hyperparathyroidism ([place])    Renal  . Thyroid disease     Past Surgical History:  Procedure Laterality Date  . AV FISTULA PLACEMENT Left 11/19/2017   Procedure: ARTERIOVENOUS (AV) FISTULA CREATION;  Surgeon: [name], MD;  Location: [place];  Service: Vascular;  Laterality: Left;  . BASCILIC VEIN TRANSPOSITION Left 06/04/2018   Procedure: BASILIC VEIN TRANSPOSITION ARM;  Surgeon: [name], MD;  Location: [place];  Service: Vascular;  Laterality: Left;  . COLONOSCOPY    . DIALYSIS/PERMA CATHETER REMOVAL N/A 03/23/2019   Procedure: DIALYSIS/PERMA CATHETER REMOVAL;  Surgeon: [name], MD;  Location: [facility];  Service: Cardiovascular;  Laterality: N/A;  . EYE SURGERY     cataract surgery b/l; photophobia since   . INSERTION OF DIALYSIS CATHETER N/A 01/23/2019   Procedure: INSERTION OF DIALYSIS CATHETER;  Surgeon: [name], MD;  Location: [facility];  Service: Vascular;  Laterality: N/A;  INSERTION OF DIALYSIS CATHETER  . KNEE ARTHROSCOPY Left    Knee  . THROMBECTOMY W/ EMBOLECTOMY Left 01/27/2019   Procedure: THROMBECTOMY Left arm ARTERIOVENOUS FISTULA  and Left arm Arteriovenous gortex graft;  Surgeon: [name], MD;  Location: [place];  Service: Vascular;  Laterality: Left;  Marland Kitchen VENOGRAM Left 01/27/2019  Procedure: Left arm FISTULOGRAM/;  Surgeon: [name], MD;  Location[facility];  Service: Vascular;  Laterality: Left;    Family History  Problem Relation Age of Onset  . Diabetes Mother   . Kidney disease Mother   . Liver disease Mother   . Heart Problems Mother     SOCIAL HX:  4 kids (3 daughters and 1 son) Daughter Frank [name]   Current Outpatient Medications:  .  allopurinol (ZYLOPRIM) 100 MG tablet, Take 100 mg by mouth daily. , Disp: , Rfl:  .  aspirin EC 81 MG tablet, Take 81 mg daily by mouth., Disp: , Rfl:  .  atorvastatin  (LIPITOR) 20 MG tablet, Take 20 mg by mouth daily at 6 PM. , Disp: , Rfl:  .  AURYXIA 1 GM 210 MG(Fe) tablet, Take 210 mg by mouth 3 (three) times daily with meals. , Disp: , Rfl:  .  docusate sodium (COLACE) 100 MG capsule, Take 200 mg by mouth daily as needed. , Disp: , Rfl:  .  gabapentin (NEURONTIN) 300 MG capsule, Take 1 capsule (300 mg total) by mouth daily. At 3 PM, Disp: 90 capsule, Rfl: 3 .  levothyroxine (SYNTHROID) 150 MCG tablet, Take 150 mcg by mouth daily before breakfast., Disp: , Rfl:  .  multivitamin (RENA-VIT) TABS tablet, Take 1 tablet daily by mouth., Disp: , Rfl:  .  tamsulosin (FLOMAX) 0.4 MG CAPS capsule, Take 0.8 mg by mouth daily after supper., Disp: , Rfl:  .  lidocaine-prilocaine (EMLA) cream, APPLY SMALL AMOUNT TO ACCESS SITE (AVF) 1 TO 2 HOURS BEFORE DIALYSIS. COVER WITH OCCLUSIVE DRESSING (SARAN WRAP), Disp: , Rfl:  .  ondansetron (ZOFRAN) 4 MG tablet, Take 4 mg by mouth every 8 (eight) hours as needed for nausea or vomiting. , Disp: , Rfl:  .  Zinc Oxide (BALMEX EX), Apply 1 application topically daily as needed (to irritated areas of skin). , Disp: , Rfl:   EXAM:  VITALS per patient if applicable:  GENERAL: alert, oriented, appears well and in no acute distress  HEENT: atraumatic, conjunttiva clear, no obvious abnormalities on inspection of external nose and ears  NECK: normal movements of the head and neck  LUNGS: on inspection no signs of respiratory distress, breathing rate appears normal, no obvious gross SOB, gasping or wheezing  CV: no obvious cyanosis  MS: moves all visible extremities without noticeable abnormality  PSYCH/NEURO: pleasant and cooperative, no obvious depression or anxiety, speech and thought processing grossly intact Stuttering   ASSESSMENT AND PLAN:  Discussed the following assessment and plan:  Insomnia -avoid Tylenol pm  -disc remeron, trazadone, stress relax brand L theanine or Tranquil sleep they will try supplement 1st  if this doesn't help consider trazadone prn instead of remeron  Atherosclerosis Coronary artery disease -incidentally found  -check lipid  -consider cardiology in future with h/o syncope and elevated troponin  -echo utd   Benign prostatic hyperplasia  - Plan: PSA -flomax taking 0.4-0.8 mg qhs    chronic pain (I.e knees, etc- Plan: gabapentin (NEURONTIN) 300 MG capsule qd instead of 100 mg x 3 capsules    Hypothyroidism, unspecified type - Plan: TSH, T4, free -cont levothyroxine 150 mcg qd   Gout of knee - Plan: Uric acid Options limited due to esrd on hd  DM type 2 with diabetic peripheral neuropathy, retinopathy, and nephropathy b/l - Plan: Hemoglobin A1c -sch fasting labs [facility] will go Monday -will need to do eye and and foot exam in the  future  -goes to [facility]   ESRD (end stage renal disease) on dialysis with anemia of chronic disease, hyperphos and secondary hyperparathyroidism [facility] TThSat - Plan:  -f/u renal Dr. Posey Pronto and Dr. Justin Mend  -cont meds   Anemia, likely of chronic disease- Plan: CBC with Differential/Platelet, Iron, TIBC and Ferritin Panel  Acute cystitis without hematuria - Plan: Urinalysis, Routine w reflex microscopic, Urine Culture  Vitamin D deficiency - Plan: Vitamin D (25 hydroxy)  Hyperlipidemia, unspecified hyperlipidemia type -check fasting lipid   HM Consider flu vaccine at our clinic of HD high dose  prevnar utd  pna 23 had 08/06/16  Tdap utd 06/30/18   Confirm if had shingrix 2/2 only showing 1 dose   Consider hepatitis B vaccine in the future not immune as of 01/22/19   HIV negative 11/28/15 negative  Hep BSAg negative 11/28/15 Hep C negative 11/28/15  Check PSA with [facility] labs and fasting labs  Skin check at f/u   Colonoscopy 06/25/17 2 sessile polyps, hemorrhoids (adenomatous polyp) likely repeat in 5 years had at [facility]  -per daughter recent fobt negative x 3   Former smoker quantify in future   [facility] eye exam  10/07/17 +retinopathy b/l no tx needed  -now pending eye exam Dr. Mordecai Rasmussen upcoming [facility]   Echo 01/10/16 EF 55-60% mild concentric LVH Gynecomastia mammo 11/23/13 negative   Of note hospitalized 5/13/-01/28/19 for near syncope/elevated troponin, chronic pain, ckd 4, DM 2, hypothyroidism   Previous PCP Dr. Quita Skye Goldstein/Dr. Armen Pickup [facility]   I discussed the assessment and treatment plan with the patient. The patient was provided an opportunity to ask questions and all were answered. The patient agreed with the plan and demonstrated an understanding of the instructions.   The patient was advised to call back or seek an in-person evaluation if the symptoms worsen or if the condition fails to improve as anticipated.  Time spent 45 minutes  Delorise Jackson, MD

## 2019-05-11 ENCOUNTER — Other Ambulatory Visit: Payer: Self-pay | Admitting: Internal Medicine

## 2019-05-12 ENCOUNTER — Telehealth: Payer: Self-pay | Admitting: Internal Medicine

## 2019-05-12 ENCOUNTER — Other Ambulatory Visit: Payer: Self-pay | Admitting: Internal Medicine

## 2019-05-12 DIAGNOSIS — E039 Hypothyroidism, unspecified: Secondary | ICD-10-CM

## 2019-05-12 LAB — LIPID PANEL
Chol/HDL Ratio: 2.4 ratio (ref 0.0–5.0)
Cholesterol, Total: 149 mg/dL (ref 100–199)
HDL: 62 mg/dL (ref >39)
LDL Chol Calc (NIH): 72 mg/dL (ref 0–99)
Triglycerides: 79 mg/dL (ref 0–149)
VLDL Cholesterol Cal: 15 mg/dL (ref 5–40)

## 2019-05-12 MED ORDER — LEVOTHYROXINE SODIUM 175 MCG PO TABS: 175.0000 ug | ORAL_TABLET | Freq: Every day | ORAL | 3 refills | Status: DC

## 2019-05-12 NOTE — Telephone Encounter (Signed)
Daughter Frank [name] called back, requesting work in for 1 month follow up appt with flu shot. Please advise

## 2019-05-12 NOTE — Telephone Encounter (Signed)
I called pt to schedule appt/ Daughter states she will call back to schedule appt.   Call pt to schedule f/u in 1 month with flu shot possibly as well  -in office appt

## 2019-05-13 ENCOUNTER — Telehealth: Payer: Self-pay | Admitting: Internal Medicine

## 2019-05-13 ENCOUNTER — Other Ambulatory Visit: Payer: Self-pay | Admitting: Internal Medicine

## 2019-05-13 DIAGNOSIS — N3001 Acute cystitis with hematuria: Secondary | ICD-10-CM

## 2019-05-13 MED ORDER — LEVOFLOXACIN 250 MG PO TABS: ORAL_TABLET | ORAL | 0 refills | Status: DC

## 2019-05-13 NOTE — Telephone Encounter (Signed)
Ok to work in   [employer] called back, requesting work in for 1 month follow up appt with flu shot. Please advise

## 2019-05-14 NOTE — Progress Notes (Signed)
Only one shingrix is showing

## 2019-05-15 ENCOUNTER — Other Ambulatory Visit: Payer: Self-pay | Admitting: Internal Medicine

## 2019-05-15 DIAGNOSIS — R7989 Other specified abnormal findings of blood chemistry: Secondary | ICD-10-CM

## 2019-05-15 DIAGNOSIS — N390 Urinary tract infection, site not specified: Secondary | ICD-10-CM

## 2019-05-15 LAB — URINALYSIS, ROUTINE W REFLEX MICROSCOPIC
Bilirubin, UA: NEGATIVE
Glucose, UA: NEGATIVE
Ketones, UA: NEGATIVE
Nitrite, UA: NEGATIVE
Specific Gravity, UA: 1.011 (ref 1.005–1.030)
Urobilinogen, Ur: 0.2 mg/dL (ref 0.2–1.0)
pH, UA: 5.5 (ref 5.0–7.5)

## 2019-05-15 LAB — HEMOGLOBIN A1C
Est. average glucose Bld gHb Est-mCnc: 143 mg/dL
Hgb A1c MFr Bld: 6.6 % — ABNORMAL HIGH (ref 4.8–5.6)

## 2019-05-15 LAB — CBC WITH DIFFERENTIAL/PLATELET
Basophils Absolute: 0 10*3/uL (ref 0.0–0.2)
Basos: 0 %
EOS (ABSOLUTE): 0.3 10*3/uL (ref 0.0–0.4)
Eos: 3 %
Hematocrit: 37.5 % (ref 37.5–51.0)
Hemoglobin: 12.5 g/dL — ABNORMAL LOW (ref 13.0–17.7)
Immature Grans (Abs): 0 10*3/uL (ref 0.0–0.1)
Immature Granulocytes: 0 %
Lymphocytes Absolute: 1.6 10*3/uL (ref 0.7–3.1)
Lymphs: 16 %
MCH: 30.5 pg (ref 26.6–33.0)
MCHC: 33.3 g/dL (ref 31.5–35.7)
MCV: 92 fL (ref 79–97)
Monocytes Absolute: 0.8 10*3/uL (ref 0.1–0.9)
Monocytes: 8 %
Neutrophils Absolute: 7.3 10*3/uL — ABNORMAL HIGH (ref 1.4–7.0)
Neutrophils: 73 %
Platelets: 169 10*3/uL (ref 150–450)
RBC: 4.1 x10E6/uL — ABNORMAL LOW (ref 4.14–5.80)
RDW: 14.1 % (ref 11.6–15.4)
WBC: 10 10*3/uL (ref 3.4–10.8)

## 2019-05-15 LAB — COMPREHENSIVE METABOLIC PANEL
ALT: 88 IU/L — ABNORMAL HIGH (ref 0–44)
AST: 63 IU/L — ABNORMAL HIGH (ref 0–40)
Albumin/Globulin Ratio: 1.3 (ref 1.2–2.2)
Albumin: 4 g/dL (ref 3.7–4.7)
Alkaline Phosphatase: 168 IU/L — ABNORMAL HIGH (ref 39–117)
BUN/Creatinine Ratio: 13 (ref 10–24)
BUN: 79 mg/dL (ref 8–27)
Bilirubin Total: 0.3 mg/dL (ref 0.0–1.2)
CO2: 22 mmol/L (ref 20–29)
Calcium: 9.4 mg/dL (ref 8.6–10.2)
Chloride: 84 mmol/L — ABNORMAL LOW (ref 96–106)
Creatinine, Ser: 6.26 mg/dL — ABNORMAL HIGH (ref 0.76–1.27)
GFR calc Af Amer: 9 mL/min/{1.73_m2} — ABNORMAL LOW (ref >59)
GFR calc non Af Amer: 8 mL/min/{1.73_m2} — ABNORMAL LOW (ref >59)
Globulin, Total: 3.2 g/dL (ref 1.5–4.5)
Glucose: 126 mg/dL — ABNORMAL HIGH (ref 65–99)
Potassium: 4.3 mmol/L (ref 3.5–5.2)
Sodium: 129 mmol/L — ABNORMAL LOW (ref 134–144)
Total Protein: 7.2 g/dL (ref 6.0–8.5)

## 2019-05-15 LAB — URINE CULTURE

## 2019-05-15 LAB — MICROALBUMIN / CREATININE URINE RATIO
Creatinine, Urine: 47.6 mg/dL
Microalb/Creat Ratio: 1075 mg/g creat — ABNORMAL HIGH (ref 0–29)
Microalbumin, Urine: 511.7 ug/mL

## 2019-05-15 LAB — MAGNESIUM: Magnesium: 2.3 mg/dL (ref 1.6–2.3)

## 2019-05-15 LAB — IRON,TIBC AND FERRITIN PANEL
Ferritin: 652 ng/mL — ABNORMAL HIGH (ref 30–400)
Iron Saturation: 37 % (ref 15–55)
Iron: 102 ug/dL (ref 38–169)
Total Iron Binding Capacity: 279 ug/dL (ref 250–450)
UIBC: 177 ug/dL (ref 111–343)

## 2019-05-15 LAB — MICROSCOPIC EXAMINATION
Casts: NONE SEEN /lpf
Epithelial Cells (non renal): NONE SEEN /hpf (ref 0–10)
WBC, UA: >30 /hpf — AB (ref 0–5)

## 2019-05-15 LAB — T4, FREE: Free T4: 1.15 ng/dL (ref 0.82–1.77)

## 2019-05-15 LAB — URIC ACID: Uric Acid: 5.2 mg/dL (ref 3.7–8.6)

## 2019-05-15 LAB — PSA: Prostate Specific Ag, Serum: 0.7 ng/mL (ref 0.0–4.0)

## 2019-05-15 LAB — PHOSPHORUS: Phosphorus: 8.8 mg/dL — ABNORMAL HIGH (ref 2.8–4.1)

## 2019-05-15 LAB — TSH: TSH: 4.98 u[IU]/mL — ABNORMAL HIGH (ref 0.450–4.500)

## 2019-05-15 LAB — VITAMIN D 25 HYDROXY (VIT D DEFICIENCY, FRACTURES): Vit D, 25-Hydroxy: 11.1 ng/mL — ABNORMAL LOW (ref 30.0–100.0)

## 2019-05-26 ENCOUNTER — Telehealth: Payer: Self-pay

## 2019-05-26 ENCOUNTER — Ambulatory Visit: Payer: Medicare Other

## 2019-05-26 NOTE — Telephone Encounter (Signed)
Reschedule please.

## 2019-05-26 NOTE — Telephone Encounter (Signed)
Copied from [place] [phone]. Topic: General - Other >> May 26, 2019 11:15 AM Antonieta Iba C wrote: Reason for CRM: imaging called in to make provider aware that pt did not show up for his imaging ([place]) apt this morning.

## 2019-06-01 ENCOUNTER — Ambulatory Visit
Admission: RE | Admit: 2019-06-01 | Discharge: 2019-06-01 | Disposition: A | Discharge status: Home / Self Care | Payer: Medicare Other | Source: Ambulatory Visit | Attending: Internal Medicine | Admitting: Internal Medicine

## 2019-06-01 ENCOUNTER — Other Ambulatory Visit: Payer: Self-pay

## 2019-06-01 DIAGNOSIS — R945 Abnormal results of liver function studies: Secondary | ICD-10-CM | POA: Insufficient documentation

## 2019-06-01 DIAGNOSIS — R7989 Other specified abnormal findings of blood chemistry: Secondary | ICD-10-CM

## 2019-06-03 ENCOUNTER — Telehealth: Payer: Self-pay

## 2019-06-03 ENCOUNTER — Other Ambulatory Visit: Payer: Self-pay | Admitting: Internal Medicine

## 2019-06-03 DIAGNOSIS — M109 Gout, unspecified: Secondary | ICD-10-CM

## 2019-06-03 MED ORDER — ALLOPURINOL 100 MG PO TABS: 100.0000 mg | ORAL_TABLET | Freq: Every day | ORAL | 3 refills | Status: AC

## 2019-06-03 NOTE — Telephone Encounter (Signed)
Copied from [place] [phone]. Topic: Appointment Scheduling - Scheduling Inquiry for Clinic >> Jun 03, 2019 11:17 AM Virl Axe D wrote: Reason for CRM: Pt's daughter stated that pt is supposed to have a 4 week follow up with Dr. Aundra Dubin from his initial appt on 05/08/19. Pt has dialysis on the day his followup was scheduled 06/09/19. Dr. Aundra Dubin has no openings available until December. They would like to know if there is any way for pt to be worked in on another day. Daughter mentioned waiting on a CB from [place] but nothing is documented regarding that. Please advise. [phone]

## 2019-06-03 NOTE — Telephone Encounter (Signed)
Spoke to daughter and rescheduled appointment

## 2019-06-05 LAB — HM DIABETES EYE EXAM

## 2019-06-08 LAB — HM DIABETES EYE EXAM

## 2019-06-09 ENCOUNTER — Ambulatory Visit: Payer: Medicare Other | Admitting: Internal Medicine

## 2019-06-09 ENCOUNTER — Other Ambulatory Visit: Payer: Self-pay

## 2019-06-10 ENCOUNTER — Encounter: Payer: Self-pay | Admitting: Internal Medicine

## 2019-06-10 ENCOUNTER — Ambulatory Visit (INDEPENDENT_AMBULATORY_CARE_PROVIDER_SITE_OTHER): Payer: Medicare Other

## 2019-06-10 ENCOUNTER — Other Ambulatory Visit: Payer: Self-pay

## 2019-06-10 ENCOUNTER — Ambulatory Visit (INDEPENDENT_AMBULATORY_CARE_PROVIDER_SITE_OTHER): Payer: Medicare Other | Admitting: Internal Medicine

## 2019-06-10 VITALS — BP 118/50 | HR 74 | Temp 97.8°F | Ht 67.0 in | Wt 199.6 lb

## 2019-06-10 DIAGNOSIS — M542 Cervicalgia: Secondary | ICD-10-CM | POA: Diagnosis not present

## 2019-06-10 DIAGNOSIS — E11319 Type 2 diabetes mellitus with unspecified diabetic retinopathy without macular edema: Secondary | ICD-10-CM

## 2019-06-10 DIAGNOSIS — R748 Abnormal levels of other serum enzymes: Secondary | ICD-10-CM | POA: Insufficient documentation

## 2019-06-10 DIAGNOSIS — Z23 Encounter for immunization: Secondary | ICD-10-CM | POA: Diagnosis not present

## 2019-06-10 DIAGNOSIS — N186 End stage renal disease: Secondary | ICD-10-CM

## 2019-06-10 DIAGNOSIS — M549 Dorsalgia, unspecified: Secondary | ICD-10-CM

## 2019-06-10 DIAGNOSIS — M545 Low back pain, unspecified: Secondary | ICD-10-CM

## 2019-06-10 DIAGNOSIS — N3000 Acute cystitis without hematuria: Secondary | ICD-10-CM | POA: Diagnosis not present

## 2019-06-10 DIAGNOSIS — Z992 Dependence on renal dialysis: Secondary | ICD-10-CM

## 2019-06-10 DIAGNOSIS — L219 Seborrheic dermatitis, unspecified: Secondary | ICD-10-CM

## 2019-06-10 DIAGNOSIS — E871 Hypo-osmolality and hyponatremia: Secondary | ICD-10-CM

## 2019-06-10 DIAGNOSIS — E119 Type 2 diabetes mellitus without complications: Secondary | ICD-10-CM

## 2019-06-10 DIAGNOSIS — H35371 Puckering of macula, right eye: Secondary | ICD-10-CM

## 2019-06-10 DIAGNOSIS — I10 Essential (primary) hypertension: Secondary | ICD-10-CM

## 2019-06-10 DIAGNOSIS — H6123 Impacted cerumen, bilateral: Secondary | ICD-10-CM

## 2019-06-10 DIAGNOSIS — E039 Hypothyroidism, unspecified: Secondary | ICD-10-CM

## 2019-06-10 DIAGNOSIS — N2581 Secondary hyperparathyroidism of renal origin: Secondary | ICD-10-CM

## 2019-06-10 DIAGNOSIS — R634 Abnormal weight loss: Secondary | ICD-10-CM

## 2019-06-10 DIAGNOSIS — N400 Enlarged prostate without lower urinary tract symptoms: Secondary | ICD-10-CM

## 2019-06-10 HISTORY — DX: Abnormal levels of other serum enzymes: R74.8

## 2019-06-10 HISTORY — DX: Puckering of macula, right eye: H35.371

## 2019-06-10 LAB — PHOSPHORUS: Phosphorus: 5.4 mg/dL — ABNORMAL HIGH (ref 2.3–4.6)

## 2019-06-10 LAB — COMPREHENSIVE METABOLIC PANEL
ALT: 67 U/L — ABNORMAL HIGH (ref 0–53)
AST: 47 U/L — ABNORMAL HIGH (ref 0–37)
Albumin: 4.3 g/dL (ref 3.5–5.2)
Alkaline Phosphatase: 169 U/L — ABNORMAL HIGH (ref 39–117)
BUN: 56 mg/dL — ABNORMAL HIGH (ref 6–23)
CO2: 33 mEq/L — ABNORMAL HIGH (ref 19–32)
Calcium: 10.2 mg/dL (ref 8.4–10.5)
Chloride: 88 mEq/L — ABNORMAL LOW (ref 96–112)
Creatinine, Ser: 5.7 mg/dL (ref 0.40–1.50)
GFR: 9.71 mL/min — CL (ref >60.00)
Glucose, Bld: 245 mg/dL — ABNORMAL HIGH (ref 70–99)
Potassium: 4 mEq/L (ref 3.5–5.1)
Sodium: 132 mEq/L — ABNORMAL LOW (ref 135–145)
Total Bilirubin: 0.4 mg/dL (ref 0.2–1.2)
Total Protein: 8.3 g/dL (ref 6.0–8.3)

## 2019-06-10 LAB — GAMMA GT: GGT: 40 U/L (ref 7–51)

## 2019-06-10 MED ORDER — KETOCONAZOLE 2 % EX SHAM: 1.0000 "application " | MEDICATED_SHAMPOO | CUTANEOUS | 11 refills | Status: DC

## 2019-06-10 NOTE — Progress Notes (Addendum)
Chief Complaint  Patient presents with  . Follow-up   F/u with daughter [name] today  1. 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 Posey Pronto to f/u daughter reports they are possibly going to change phos binder but have not yet  Vitamin D also 11.1 will CC renal to coordinate care   2. Recurrent UTI E coli completed levaquin not heart from Dr. Karsten Ro urology referral  3. Elevated liver function tests [place] negative except medical renal disease daughter agreeable to CT ab/pelvis  4. DM 2 A1C 6.6 off meds saw [facility] 06/05/19  5. Chronic neck, mid and low back pain was on narcotics long term which family weaned off and wanted him to not be on it all 4 kids do not want him to be on narcotics    Review of Systems  Constitutional: Negative for weight loss.  HENT: Negative for hearing loss.        +ear wax   Eyes: Negative for blurred vision.  Respiratory: Negative for shortness of breath.   Cardiovascular: Negative for chest pain.  Gastrointestinal: Negative for abdominal pain.  Musculoskeletal: Positive for back pain, joint pain and neck pain.  Skin: Negative for rash.  Neurological: Negative for headaches.  Psychiatric/Behavioral: Positive for memory loss. Negative for depression.   Past Medical History:  Diagnosis Date  . Anemia of chronic kidney failure   . Arthritis   . Bradycardia   . Chronic kidney disease    [facility]  . Depression   . Diabetes mellitus without complication (HCC)    diet controlled   . Diabetic nephropathy ([place])   . Gout   . Gout   . History of UTI    s/p foley in hospital 01/2019   . Hyperlipidemia   . Hypothyroidism   . Neuropathy   . Peripheral neuropathy   . Peripheral vascular disease ([place])   . Secondary hyperparathyroidism ([place])    Renal  . Thyroid disease    Past Surgical History:  Procedure Laterality Date  . AV FISTULA PLACEMENT Left 11/19/2017   Procedure: ARTERIOVENOUS (AV) FISTULA CREATION;  Surgeon: [name], MD;   Location: [facility];  Service: Vascular;  Laterality: Left;  . BASCILIC VEIN TRANSPOSITION Left 06/04/2018   Procedure: BASILIC VEIN TRANSPOSITION ARM;  Surgeon: [name], MD;  Location: [facility];  Service: Vascular;  Laterality: Left;  . COLONOSCOPY    . DIALYSIS/PERMA CATHETER REMOVAL N/A 03/23/2019   Procedure: DIALYSIS/PERMA CATHETER REMOVAL;  Surgeon: [name], MD;  Location: [facility];  Service: Cardiovascular;  Laterality: N/A;  . EYE SURGERY     cataract surgery b/l; photophobia since   . INSERTION OF DIALYSIS CATHETER N/A 01/23/2019   Procedure: INSERTION OF DIALYSIS CATHETER;  Surgeon: [name], MD;  Location[facility];  Service: Vascular;  Laterality: N/A;  INSERTION OF DIALYSIS CATHETER  . KNEE ARTHROSCOPY Left    Knee  . THROMBECTOMY W/ EMBOLECTOMY Left 01/27/2019   Procedure: THROMBECTOMY Left arm ARTERIOVENOUS FISTULA  and Left arm Arteriovenous gortex graft;  Surgeon: [name], MD;  Location: [facility];  Service: Vascular;  Laterality: Left;  Marland Kitchen VENOGRAM Left 01/27/2019   Procedure: Left arm FISTULOGRAM/;  Surgeon: [name], MD;  Location: [facility];  Service: Vascular;  Laterality: Left;   Family History  Problem Relation Age of Onset  . Diabetes Mother   . Kidney disease Mother   . Liver disease Mother   . Heart Problems Mother  Social History   Socioeconomic History  . Marital status: Divorced    Spouse name: Not on file  . Number of children: Not on file  . Years of education: Not on file  . Highest education level: Not on file  Occupational History  . Not on file  Social Needs  . Financial resource strain: Not on file  . Food insecurity    Worry: Not on file    Inability: Not on file  . Transportation needs    Medical: Not on file    Non-medical: Not on file  Tobacco Use  . Smoking status: Former Smoker    Years: 30.00    Quit date: 1994    Years since quitting: 26.7  . Smokeless tobacco: Never Used  Substance  and Sexual Activity  . Alcohol use: No  . Drug use: No  . Sexual activity: Not Currently  Lifestyle  . Physical activity    Days per week: Not on file    Minutes per session: Not on file  . Stress: Not on file  Relationships  . Social Herbalist on phone: Not on file    Gets together: Not on file    Attends religious service: Not on file    Active member of club or organization: Not on file    Attends meetings of clubs or organizations: Not on file    Relationship status: Not on file  . Intimate partner violence    Fear of current or ex partner: Not on file    Emotionally abused: Not on file    Physically abused: Not on file    Forced sexual activity: Not on file  Other Topics Concern  . Not on file  Social History Narrative   4 kids (3 daughters and 1 son)   Daughter [name] DPR   Current Meds  Medication Sig  . allopurinol (ZYLOPRIM) 100 MG tablet Take 1 tablet (100 mg total) by mouth daily.  Marland Kitchen aspirin EC 81 MG tablet Take 81 mg daily by mouth.  Marland Kitchen atorvastatin (LIPITOR) 20 MG tablet Take 20 mg by mouth daily at 6 PM.   . AURYXIA 1 GM 210 MG(Fe) tablet Take 210 mg by mouth 3 (three) times daily with meals.   . docusate sodium (COLACE) 100 MG capsule Take 200 mg by mouth daily as needed.   . gabapentin (NEURONTIN) 300 MG capsule Take 1 capsule (300 mg total) by mouth daily. At 3 PM  . levothyroxine (SYNTHROID) 175 MCG tablet Take 1 tablet (175 mcg total) by mouth daily before breakfast. 30 minutes before breakfast (Patient taking differently: Take 150 mcg by mouth daily before breakfast. 30 minutes before breakfast)  . lidocaine-prilocaine (EMLA) cream APPLY SMALL AMOUNT TO ACCESS SITE (AVF) 1 TO 2 HOURS BEFORE DIALYSIS. COVER WITH OCCLUSIVE DRESSING (SARAN WRAP)  . multivitamin (RENA-VIT) TABS tablet Take 1 tablet daily by mouth.  . ondansetron (ZOFRAN) 4 MG tablet Take 4 mg by mouth every 8 (eight) hours as needed for nausea or vomiting.   . tamsulosin (FLOMAX) 0.4  MG CAPS capsule Take 0.8 mg by mouth daily after supper.  . Zinc Oxide (BALMEX EX) Apply 1 application topically daily as needed (to irritated areas of skin).    Allergies  Allergen Reactions  . Ace Inhibitors Other (See Comments)    Hyperkalemia on ACE INHIBITORS Cr>1.9  . Angiotensin Receptor Blockers Other (See Comments)    Hyperkalemia Elevates Cr > 1.9   . Ivp Dye [Iodinated  Diagnostic Agents] Other (See Comments)    Cannot have due to renal failure  . Adhesive [Tape] Other (See Comments)    UNDESCRIBED REACTION Can tolerate ONLY Coban wrap or mild paper tape!!   Recent Results (from the past 2160 hour(s))  SARS Coronavirus 2 (Performed in [facility] hospital lab)     Status: None   Collection Time: 03/20/19  1:40 PM   Specimen: Nasal Swab  Result Value Ref Range   SARS Coronavirus 2 NEGATIVE NEGATIVE    Comment: (NOTE) SARS-CoV-2 target nucleic acids are NOT DETECTED. The SARS-CoV-2 RNA is generally detectable in upper and lower respiratory specimens during the acute phase of infection. Negative results do not preclude SARS-CoV-2 infection, do not rule out co-infections with other pathogens, and should not be used as the sole basis for treatment or other patient management decisions. Negative results must be combined with clinical observations, patient history, and epidemiological information. The expected result is Negative. Fact Sheet for Patients: SugarRoll.be Fact Sheet for Healthcare Providers: https://www.woods-mathews.com/ This test is not yet approved or cleared by the [place] FDA and  has been authorized for detection and/or diagnosis of SARS-CoV-2 by FDA under an Emergency Use Authorization (EUA). This EUA will remain  in effect (meaning this test can be used) for the duration of the COVID-19 declaration under Section 56 4(b)(1) of the Act, 21 U.S.C. section 360bbb-3(b)(1), unless the authorization is  terminated or revoked sooner. Performed at [facility], [place] [address] [place], [place] [address]   Iron, TIBC and Ferritin Panel     Status: Abnormal   Collection Time: 05/11/19 10:37 AM  Result Value Ref Range   Total Iron Binding Capacity 279 250 - 450 ug/dL   UIBC 177 111 - 343 ug/dL   Iron 102 38 - 169 ug/dL   Iron Saturation 37 15 - 55 %   Ferritin 652 (H) 30 - 400 ng/mL  CBC with Differential/Platelet     Status: Abnormal   Collection Time: 05/11/19 10:37 AM  Result Value Ref Range   WBC 10.0 3.4 - 10.8 x10E3/uL   RBC 4.10 (L) 4.14 - 5.80 x10E6/uL   Hemoglobin 12.5 (L) 13.0 - 17.7 g/dL   Hematocrit 37.5 37.5 - 51.0 %   MCV 92 79 - 97 fL   MCH 30.5 26.6 - 33.0 pg   MCHC 33.3 31.5 - 35.7 g/dL   RDW 14.1 11.6 - 15.4 %   Platelets 169 150 - 450 x10E3/uL   Neutrophils 73 Not Estab. %   Lymphs 16 Not Estab. %   Monocytes 8 Not Estab. %   Eos 3 Not Estab. %   Basos 0 Not Estab. %   Neutrophils Absolute 7.3 (H) 1.4 - 7.0 x10E3/uL   Lymphocytes Absolute 1.6 0.7 - 3.1 x10E3/uL   Monocytes Absolute 0.8 0.1 - 0.9 x10E3/uL   EOS (ABSOLUTE) 0.3 0.0 - 0.4 x10E3/uL   Basophils Absolute 0.0 0.0 - 0.2 x10E3/uL   Immature Granulocytes 0 Not Estab. %   Immature Grans (Abs) 0.0 0.0 - 0.1 x10E3/uL  Comprehensive metabolic panel     Status: Abnormal   Collection Time: 05/11/19 10:37 AM  Result Value Ref Range   Glucose 126 (H) 65 - 99 mg/dL   BUN 79 (HH) 8 - 27 mg/dL   Creatinine, Ser 6.26 (H) 0.76 - 1.27 mg/dL   GFR calc non Af Amer 8 (L) >59 mL/min/1.73   GFR calc Af Amer 9 (L) >59 mL/min/1.73   BUN/Creatinine Ratio  13 10 - 24   Sodium 129 (L) 134 - 144 mmol/L   Potassium 4.3 3.5 - 5.2 mmol/L   Chloride 84 (L) 96 - 106 mmol/L   CO2 22 20 - 29 mmol/L   Calcium 9.4 8.6 - 10.2 mg/dL   Total Protein 7.2 6.0 - 8.5 g/dL   Albumin 4.0 3.7 - 4.7 g/dL   Globulin, Total 3.2 1.5 - 4.5 g/dL   Albumin/Globulin Ratio 1.3 1.2 - 2.2   Bilirubin Total 0.3 0.0 - 1.2 mg/dL   Alkaline  Phosphatase 168 (H) 39 - 117 IU/L   AST 63 (H) 0 - 40 IU/L   ALT 88 (H) 0 - 44 IU/L  Urinalysis, Routine w reflex microscopic     Status: Abnormal   Collection Time: 05/11/19 10:37 AM  Result Value Ref Range   Specific Gravity, UA 1.011 1.005 - 1.030   pH, UA 5.5 5.0 - 7.5   Color, UA Yellow Yellow   Appearance Ur Turbid (A) Clear   Leukocytes,UA 3+ (A) Negative   Protein,UA 2+ (A) Negative/Trace   Glucose, UA Negative Negative   Ketones, UA Negative Negative   RBC, UA 1+ (A) Negative   Bilirubin, UA Negative Negative   Urobilinogen, Ur 0.2 0.2 - 1.0 mg/dL   Nitrite, UA Negative Negative   Microscopic Examination See below:     Comment: Microscopic was indicated and was performed.  Microscopic Examination     Status: Abnormal   Collection Time: 05/11/19 10:37 AM  Result Value Ref Range   WBC, UA >30 (A) 0 - 5 /hpf    Comment: Clumps of leukocytes present.   RBC 3-10 (A) 0 - 2 /hpf   Epithelial Cells (non renal) None seen 0 - 10 /hpf   Casts None seen None seen /lpf   Crystals Present (A) N/A   Crystal Type Amorphous Sediment N/A   Mucus, UA Present Not Estab.   Bacteria, UA Many (A) None seen/Few  Lipid panel     Status: None   Collection Time: 05/11/19 10:37 AM  Result Value Ref Range   Cholesterol, Total 149 100 - 199 mg/dL   Triglycerides 79 0 - 149 mg/dL   HDL 62 >39 mg/dL   VLDL Cholesterol Cal 15 5 - 40 mg/dL   LDL Chol Calc (NIH) 72 0 - 99 mg/dL   Lipid Comment: CANCELED     Comment: Test not performed  Result canceled by the ancillary.    Chol/HDL Ratio 2.4 0.0 - 5.0 ratio    Comment:                                   T. Chol/HDL Ratio                                             Men  Women                               1/2 Avg.Risk  3.4    3.3                                   Avg.Risk  5.0    4.4  2X Avg.Risk  9.6    7.1                                3X Avg.Risk 23.4   11.0   Microalbumin / creatinine urine ratio     Status:  Abnormal   Collection Time: 05/11/19 10:37 AM  Result Value Ref Range   Creatinine, Urine 47.6 Not Estab. mg/dL   Microalbumin, Urine 511.7 Not Estab. ug/mL    Comment: Results confirmed on dilution.    Microalb/Creat Ratio 1,075 (H) 0 - 29 mg/g creat    Comment:                        Normal:                0 -  29                        Moderately increased: 30 - 300                        Severely increased:       >300               **Please note reference interval change**   Hemoglobin A1c     Status: Abnormal   Collection Time: 05/11/19 10:37 AM  Result Value Ref Range   Hgb A1c MFr Bld 6.6 (H) 4.8 - 5.6 %    Comment:          Prediabetes: 5.7 - 6.4          Diabetes: >6.4          Glycemic control for adults with diabetes: <7.0    Est. average glucose Bld gHb Est-mCnc 143 mg/dL  T4, free     Status: None   Collection Time: 05/11/19 10:37 AM  Result Value Ref Range   Free T4 1.15 0.82 - 1.77 ng/dL  TSH     Status: Abnormal   Collection Time: 05/11/19 10:37 AM  Result Value Ref Range   TSH 4.980 (H) 0.450 - 4.500 uIU/mL  PSA     Status: None   Collection Time: 05/11/19 10:37 AM  Result Value Ref Range   Prostate Specific Ag, Serum 0.7 0.0 - 4.0 ng/mL    Comment: Roche ECLIA methodology. According to the American Urological Association, Serum PSA should decrease and remain at undetectable levels after radical prostatectomy. The AUA defines biochemical recurrence as an initial PSA value 0.2 ng/mL or greater followed by a subsequent confirmatory PSA value 0.2 ng/mL or greater. Values obtained with different assay methods or kits cannot be used interchangeably. Results cannot be interpreted as absolute evidence of the presence or absence of malignant disease.   VITAMIN D 25 Hydroxy (Vit-D Deficiency, Fractures)     Status: Abnormal   Collection Time: 05/11/19 10:37 AM  Result Value Ref Range   Vit D, 25-Hydroxy 11.1 (L) 30.0 - 100.0 ng/mL    Comment: Vitamin D  deficiency has been defined by the [place] practice guideline as a level of serum 25-OH vitamin D less than 20 ng/mL (1,2). The Endocrine Society went on to further define vitamin D insufficiency as a level between 21 and 29 ng/mL (2). 1. IOM (Institute of Medicine). 2010. Dietary reference    intakes for calcium and D. [place]:  The    [employer]. 2. Holick MF, Binkley [place], Bischoff-Ferrari HA, et al.    Evaluation, treatment, and prevention of vitamin D    deficiency: an Endocrine Society clinical practice    guideline. JCEM. 2011 Jul; 96(7):1911-30.   Uric acid     Status: None   Collection Time: 05/11/19 10:37 AM  Result Value Ref Range   Uric Acid 5.2 3.7 - 8.6 mg/dL    Comment:            Therapeutic target for gout patients: <6.0  Phosphorus     Status: Abnormal   Collection Time: 05/11/19 10:37 AM  Result Value Ref Range   Phosphorus 8.8 (H) 2.8 - 4.1 mg/dL    Comment: **Verified by repeat analysis**  Magnesium     Status: None   Collection Time: 05/11/19 10:37 AM  Result Value Ref Range   Magnesium 2.3 1.6 - 2.3 mg/dL  Urine Culture     Status: Abnormal   Collection Time: 05/11/19 10:37 AM   UC  Result Value Ref Range   Urine Culture, Routine Final report (A)    Organism ID, Bacteria Escherichia coli (A)     Comment: Greater than 100,000 colony forming units per mL Cefazolin <=4 ug/mL Cefazolin with an MIC <=16 predicts susceptibility to the oral agents cefaclor, cefdinir, cefpodoxime, cefprozil, cefuroxime, cephalexin, and loracarbef when used for therapy of uncomplicated urinary tract infections due to E. coli, Klebsiella pneumoniae, and Proteus mirabilis.    ORGANISM ID, BACTERIA Proteus mirabilis (A)     Comment: Greater than 100,000 colony forming units per mL Cefazolin <=4 ug/mL Cefazolin with an MIC <=16 predicts susceptibility to the oral agents cefaclor, cefdinir, cefpodoxime, cefprozil,  cefuroxime, cephalexin, and loracarbef when used for therapy of uncomplicated urinary tract infections due to E. coli, Klebsiella pneumoniae, and Proteus mirabilis.    Antimicrobial Susceptibility Comment     Comment:       ** S = Susceptible; I = Intermediate; R = Resistant **                    P = Positive; N = Negative             MICS are expressed in micrograms per mL    Antibiotic                 RSLT#1    RSLT#2    RSLT#3    RSLT#4 Amoxicillin/Clavulanic Acid    S         S Ampicillin                     S         S Cefepime                       S         S Ceftriaxone                    S         S Cefuroxime                     S         S Ciprofloxacin                  S         S Ertapenem  S         S Gentamicin                     S         S Imipenem                       S Levofloxacin                   S         S Meropenem                      S         S Nitrofurantoin                 S         R Piperacillin/Tazobactam        S         S Tetracycline                   S         R Tobramycin                     S         S Trimethoprim/Sulfa             S         S    Objective  Body mass index is 31.26 kg/m. Wt Readings from Last 3 Encounters:  06/10/19 199 lb 9.6 oz (90.5 kg)  03/23/19 197 lb (89.4 kg)  02/23/19 198 lb 3.2 oz (89.9 kg)   Temp Readings from Last 3 Encounters:  06/10/19 97.8 F (36.6 C) (Oral)  03/23/19 98.1 F (36.7 C) (Oral)  02/23/19 97.8 F (36.6 C) (Oral)   BP Readings from Last 3 Encounters:  06/10/19 (!) 118/50  03/23/19 (!) 157/61  02/23/19 (!) 147/49   Pulse Readings from Last 3 Encounters:  06/10/19 74  03/23/19 (!) 44  02/23/19 82    Physical Exam Vitals signs and nursing note reviewed.  Constitutional:      Appearance: Normal appearance. He is well-developed and well-groomed. He is obese.  HENT:     Head: Normocephalic and atraumatic.     Comments: +mask on   Eyes:      Conjunctiva/sclera: Conjunctivae normal.     Pupils: Pupils are equal, round, and reactive to light.  Cardiovascular:     Rate and Rhythm: Normal rate and regular rhythm.     Heart sounds: Normal heart sounds. No murmur.  Pulmonary:     Effort: Pulmonary effort is normal.     Breath sounds: Normal breath sounds.  Abdominal:     Tenderness: There is no abdominal tenderness.  Skin:    General: Skin is warm and dry.          Comments: AK to left ear helix and left hand    Neurological:     General: No focal deficit present.     Mental Status: He is alert and oriented to person, place, and time. Mental status is at baseline.     Gait: Gait normal.  Psychiatric:        Attention and Perception: Attention and perception normal.        Mood and Affect: Mood and affect normal.        Speech: Speech normal.  Behavior: Behavior normal. Behavior is cooperative.        Thought Content: Thought content normal.        Cognition and Memory: Cognition and memory normal.        Judgment: Judgment normal.     Assessment  Plan  Acute cystitis without hematuria - Plan: Urinalysis, Routine w reflex microscopic, Urine Culture Repeat urine recently complete levaquin  Will f/u with urology not heard from appt Dr. Karsten Ro  Serum phosphate elevated - Plan: Phosphorus CC renal for input recheck today   ESRD (end stage renal disease) ([place]) - Plan: Phosphorus See above   Elevated liver enzymes - Plan: Comprehensive metabolic panel, Gamma GT -consider CT ab/pelvis r/o maligancy [place] negative   Seborrheic dermatitis - Plan: ketoconazole (NIZORAL) 2 % shampoo  Bilateral impacted cerumen - Plan: Ambulatory referral to ENT Dr. Tami Ribas   Cervicalgia - Plan: DG Cervical Spine Complete Mid back pain - Plan: DG Thoracic Spine 2 View Low back pain, unspecified back pain laterality, unspecified chronicity, unspecified whether sciatica present - Plan: DG Lumbar Spine Complete Family does not want  narcotics disc tramadol but was on HC x years and family does not want this  Consider PM&R in future chronic pain   Epiretinal membrane (ERM) of right eye-f/u AE  Secondary hyperparathyroidism ([place])  Essential hypertension-BP controlled today 118/50   ESRD (end stage renal disease) on dialysis ([place]) TThSSAt  -f/u renal Dr. 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 Wallace Going A1C 6.6 05/11/2019  - records [facility] 06/05/19  Results for MARISSA, GROSZ (MRN [id number]) as of 06/10/2019 12:56  Ref. Range 05/11/2019 10:37  Microalbumin, Urine Latest Ref Range: Not Estab. ug/mL 511.7  MICROALB/CREAT RATIO Latest Ref Range: 0 - 29 mg/g creat 1,075 (H)  Urobilinogen, Ur Latest Ref Range: 0.2 - 1.0 mg/dL 0.2  Creatinine, Urine Latest Ref Range: Not Estab. mg/dL 47.6  -on statin  -monitor cbgs will not add medication to control for now  -do foot exam at f/u   Hypothyroidism  On 175 mcg levo  Recheck TSH at f/u   HM Flu shot given today  prevnar utd  pna 23 had 08/06/16  Tdap utd 06/30/18   Confirm if had shingrix 2/2 only showing 1 dose did not have  Given Rx shingrix today   Consider hepatitis B vaccine in the future not immune as of 01/22/19   HIV negative 11/28/15 negative  Hep BSAg negative 11/28/15 Hep C negative 11/28/15  Check PSA 0.7 normal 05/11/2019 with h/o BPH noted imaging CT ab/pelvis 11/28/15  Skin check with AK left ear and hand consider derm in future as of 06/10/2019   Colonoscopy 06/25/17 2 sessile polyps, hemorrhoids (adenomatous polyp) likely repeat in 5 years had at [facility]  -per daughter recent fobt negative x 3   Former smoker quit in 1994 teenager 17 x 34 years  2-3 ppd  [facility] exam 10/07/17 +retinopathy b/l no tx needed  -AE eye exam 06/05/2019 Dr. Mordecai Rasmussen upcoming [facility]   ENT Dr. Tami Ribas   Echo 01/10/16 EF 55-60% mild concentric LVH Gynecomastia mammo 11/23/13 negative   Of  note hospitalized 5/13/-01/28/19 for near syncope/elevated troponin, chronic pain, ckd   Previous PCP Dr. Quita Skye Goldstein/Dr. Armen Pickup [facility]   [facility] Dr. Karsten Ro seen 09/23/19 BPH with LUTS incomplete bladder emptying ws on flomax 0.8 mg qhs ? If this medication was d/c'ed urology noted bladder emtying adequately  Provider: Dr. Olivia Mackie [facility]-Internal Medicine

## 2019-06-10 NOTE — Patient Instructions (Signed)
Referring to Ear Nose Throat Dr. Tami Ribas  Will discuss with Dr. Posey Pronto about CT of your abdomen pelvis with dye   Take care

## 2019-06-10 NOTE — Progress Notes (Signed)
He has been scheduled and on waiting list. They have probably spoke to his daughter with the appointment. It is Wed. 12/16 at 930

## 2019-06-12 ENCOUNTER — Other Ambulatory Visit: Payer: Self-pay | Admitting: Internal Medicine

## 2019-06-12 DIAGNOSIS — R0989 Other specified symptoms and signs involving the circulatory and respiratory systems: Secondary | ICD-10-CM

## 2019-06-12 DIAGNOSIS — R55 Syncope and collapse: Secondary | ICD-10-CM

## 2019-06-12 DIAGNOSIS — R748 Abnormal levels of other serum enzymes: Secondary | ICD-10-CM

## 2019-06-12 DIAGNOSIS — F17211 Nicotine dependence, cigarettes, in remission: Secondary | ICD-10-CM

## 2019-06-12 DIAGNOSIS — Z720 Tobacco use: Secondary | ICD-10-CM

## 2019-06-12 DIAGNOSIS — E871 Hypo-osmolality and hyponatremia: Secondary | ICD-10-CM

## 2019-06-12 LAB — URINE CULTURE
MICRO NUMBER:: 939394
SPECIMEN QUALITY:: ADEQUATE

## 2019-06-12 LAB — URINALYSIS, ROUTINE W REFLEX MICROSCOPIC
Bilirubin Urine: NEGATIVE
Ketones, ur: NEGATIVE
Nitrite: NEGATIVE
Specific Gravity, Urine: 1.018 (ref 1.001–1.03)
Squamous Epithelial / HPF: NONE SEEN /HPF (ref <=5)
pH: <=5 (ref 5.0–8.0)

## 2019-06-19 NOTE — Addendum Note (Signed)
Addended by: Orland Mustard on: 06/19/2019 12:58 PM   Modules accepted: Orders

## 2019-06-22 ENCOUNTER — Telehealth: Payer: Self-pay

## 2019-06-22 NOTE — Telephone Encounter (Signed)
-----   Message from Delorise Jackson, MD sent at 06/19/2019 12:58 PM EDT ----- Lenna Sciara please sch CT ab/pelvis with contrast (renal ok) same day CT chest Fransisco Beau call daughter advised kidney MD ok with dye

## 2019-06-22 NOTE — Telephone Encounter (Signed)
Patient daughter has been notified

## 2019-06-22 NOTE — Telephone Encounter (Signed)
Left message for patient to return call back. PEC may give information.  

## 2019-06-25 ENCOUNTER — Telehealth: Payer: Self-pay

## 2019-06-25 NOTE — Telephone Encounter (Signed)
I spoke with pts kidney doctor who is ok with contrast  Call radiology   [place]

## 2019-06-25 NOTE — Telephone Encounter (Signed)
Angel w/ CT called requesting that orders be changed to CT abdominal/pelvis w/o IV contrast.  She said she sent Dr. Aundra Dubin a message in [place].

## 2019-06-26 ENCOUNTER — Encounter (INDEPENDENT_AMBULATORY_CARE_PROVIDER_SITE_OTHER): Payer: Self-pay

## 2019-06-26 ENCOUNTER — Other Ambulatory Visit: Payer: Medicare Other

## 2019-06-26 ENCOUNTER — Other Ambulatory Visit: Payer: Self-pay

## 2019-06-26 ENCOUNTER — Ambulatory Visit
Admission: RE | Admit: 2019-06-26 | Discharge: 2019-06-26 | Disposition: A | Discharge status: Home / Self Care | Payer: Medicare Other | Source: Ambulatory Visit | Attending: Internal Medicine | Admitting: Internal Medicine

## 2019-06-26 DIAGNOSIS — R634 Abnormal weight loss: Secondary | ICD-10-CM | POA: Diagnosis present

## 2019-06-26 DIAGNOSIS — J439 Emphysema, unspecified: Secondary | ICD-10-CM | POA: Insufficient documentation

## 2019-06-26 DIAGNOSIS — R918 Other nonspecific abnormal finding of lung field: Secondary | ICD-10-CM | POA: Diagnosis not present

## 2019-06-26 DIAGNOSIS — F17211 Nicotine dependence, cigarettes, in remission: Secondary | ICD-10-CM | POA: Diagnosis present

## 2019-06-26 DIAGNOSIS — E871 Hypo-osmolality and hyponatremia: Secondary | ICD-10-CM | POA: Insufficient documentation

## 2019-06-26 DIAGNOSIS — R748 Abnormal levels of other serum enzymes: Secondary | ICD-10-CM | POA: Insufficient documentation

## 2019-06-26 DIAGNOSIS — I700 Atherosclerosis of aorta: Secondary | ICD-10-CM | POA: Diagnosis not present

## 2019-06-26 DIAGNOSIS — Z720 Tobacco use: Secondary | ICD-10-CM

## 2019-06-26 MED ORDER — IOHEXOL 300 MG/ML  SOLN
100.0000 mL | Freq: Once | INTRAMUSCULAR | Status: AC | PRN
  Administered 2019-06-26: 100 mL via INTRAVENOUS

## 2019-06-26 NOTE — Telephone Encounter (Signed)
What is the number to radiology?

## 2019-06-29 NOTE — Telephone Encounter (Signed)
[phone] option 3, option 2

## 2019-06-29 NOTE — Telephone Encounter (Signed)
CT has already been completed.

## 2019-07-06 ENCOUNTER — Other Ambulatory Visit: Payer: Self-pay

## 2019-07-06 ENCOUNTER — Other Ambulatory Visit (INDEPENDENT_AMBULATORY_CARE_PROVIDER_SITE_OTHER): Payer: Self-pay | Admitting: Vascular Surgery

## 2019-07-06 ENCOUNTER — Telehealth (INDEPENDENT_AMBULATORY_CARE_PROVIDER_SITE_OTHER): Payer: Self-pay

## 2019-07-06 ENCOUNTER — Encounter
Admission: RE | Disposition: A | Discharge status: Home / Self Care | Payer: Self-pay | Source: Ambulatory Visit | Attending: Vascular Surgery

## 2019-07-06 ENCOUNTER — Ambulatory Visit
Admission: RE | Admit: 2019-07-06 | Discharge: 2019-07-06 | Disposition: A | Discharge status: Home / Self Care | Payer: Medicare Other | Source: Ambulatory Visit | Attending: Vascular Surgery | Admitting: Vascular Surgery

## 2019-07-06 ENCOUNTER — Other Ambulatory Visit
Admission: RE | Admit: 2019-07-06 | Discharge: 2019-07-06 | Disposition: A | Discharge status: Home / Self Care | Payer: Medicare Other | Source: Ambulatory Visit | Attending: Vascular Surgery | Admitting: Vascular Surgery

## 2019-07-06 ENCOUNTER — Other Ambulatory Visit (INDEPENDENT_AMBULATORY_CARE_PROVIDER_SITE_OTHER): Payer: Self-pay | Admitting: Nurse Practitioner

## 2019-07-06 DIAGNOSIS — Y832 Surgical operation with anastomosis, bypass or graft as the cause of abnormal reaction of the patient, or of later complication, without mention of misadventure at the time of the procedure: Secondary | ICD-10-CM | POA: Insufficient documentation

## 2019-07-06 DIAGNOSIS — Z87891 Personal history of nicotine dependence: Secondary | ICD-10-CM | POA: Diagnosis not present

## 2019-07-06 DIAGNOSIS — N2581 Secondary hyperparathyroidism of renal origin: Secondary | ICD-10-CM | POA: Insufficient documentation

## 2019-07-06 DIAGNOSIS — Z888 Allergy status to other drugs, medicaments and biological substances status: Secondary | ICD-10-CM | POA: Diagnosis not present

## 2019-07-06 DIAGNOSIS — E11649 Type 2 diabetes mellitus with hypoglycemia without coma: Secondary | ICD-10-CM | POA: Insufficient documentation

## 2019-07-06 DIAGNOSIS — Z8249 Family history of ischemic heart disease and other diseases of the circulatory system: Secondary | ICD-10-CM | POA: Insufficient documentation

## 2019-07-06 DIAGNOSIS — Z841 Family history of disorders of kidney and ureter: Secondary | ICD-10-CM | POA: Insufficient documentation

## 2019-07-06 DIAGNOSIS — D631 Anemia in chronic kidney disease: Secondary | ICD-10-CM | POA: Insufficient documentation

## 2019-07-06 DIAGNOSIS — I2510 Atherosclerotic heart disease of native coronary artery without angina pectoris: Secondary | ICD-10-CM | POA: Diagnosis not present

## 2019-07-06 DIAGNOSIS — T82868A Thrombosis of vascular prosthetic devices, implants and grafts, initial encounter: Secondary | ICD-10-CM | POA: Diagnosis present

## 2019-07-06 DIAGNOSIS — Z95828 Presence of other vascular implants and grafts: Secondary | ICD-10-CM | POA: Diagnosis not present

## 2019-07-06 DIAGNOSIS — Z20828 Contact with and (suspected) exposure to other viral communicable diseases: Secondary | ICD-10-CM | POA: Diagnosis not present

## 2019-07-06 DIAGNOSIS — M1990 Unspecified osteoarthritis, unspecified site: Secondary | ICD-10-CM | POA: Diagnosis not present

## 2019-07-06 DIAGNOSIS — Z91041 Radiographic dye allergy status: Secondary | ICD-10-CM | POA: Insufficient documentation

## 2019-07-06 DIAGNOSIS — E1151 Type 2 diabetes mellitus with diabetic peripheral angiopathy without gangrene: Secondary | ICD-10-CM | POA: Insufficient documentation

## 2019-07-06 DIAGNOSIS — E785 Hyperlipidemia, unspecified: Secondary | ICD-10-CM | POA: Insufficient documentation

## 2019-07-06 DIAGNOSIS — E1121 Type 2 diabetes mellitus with diabetic nephropathy: Secondary | ICD-10-CM | POA: Insufficient documentation

## 2019-07-06 DIAGNOSIS — E1122 Type 2 diabetes mellitus with diabetic chronic kidney disease: Secondary | ICD-10-CM | POA: Diagnosis not present

## 2019-07-06 DIAGNOSIS — N186 End stage renal disease: Secondary | ICD-10-CM

## 2019-07-06 DIAGNOSIS — Z992 Dependence on renal dialysis: Secondary | ICD-10-CM

## 2019-07-06 DIAGNOSIS — I120 Hypertensive chronic kidney disease with stage 5 chronic kidney disease or end stage renal disease: Secondary | ICD-10-CM | POA: Diagnosis not present

## 2019-07-06 DIAGNOSIS — T82898A Other specified complication of vascular prosthetic devices, implants and grafts, initial encounter: Secondary | ICD-10-CM

## 2019-07-06 HISTORY — PX: PERIPHERAL VASCULAR THROMBECTOMY: CATH118306

## 2019-07-06 LAB — GLUCOSE, CAPILLARY
Glucose-Capillary: 109 mg/dL — ABNORMAL HIGH (ref 70–99)
Glucose-Capillary: 113 mg/dL — ABNORMAL HIGH (ref 70–99)

## 2019-07-06 LAB — SARS CORONAVIRUS 2 BY RT PCR (HOSPITAL ORDER, PERFORMED IN ~~LOC~~ HOSPITAL LAB): SARS Coronavirus 2: NEGATIVE

## 2019-07-06 LAB — POTASSIUM (ARMC VASCULAR LAB ONLY): Potassium (ARMC vascular lab): 4.9 (ref 3.5–5.1)

## 2019-07-06 SURGERY — PERIPHERAL VASCULAR THROMBECTOMY
Anesthesia: Moderate Sedation | Laterality: Left

## 2019-07-06 MED ORDER — HYDROMORPHONE HCL 1 MG/ML IJ SOLN: 1.0000 mg | Freq: Once | INTRAMUSCULAR | Status: DC | PRN

## 2019-07-06 MED ORDER — ONDANSETRON HCL 4 MG/2ML IJ SOLN: 4.0000 mg | Freq: Four times a day (QID) | INTRAMUSCULAR | Status: DC | PRN

## 2019-07-06 MED ORDER — CEFAZOLIN SODIUM-DEXTROSE 1-4 GM/50ML-% IV SOLN
INTRAVENOUS | Status: AC
  Administered 2019-07-06: 1 g via INTRAVENOUS
  Filled 2019-07-06: qty 50

## 2019-07-06 MED ORDER — FENTANYL CITRATE (PF) 100 MCG/2ML IJ SOLN
INTRAMUSCULAR | Status: AC
  Filled 2019-07-06: qty 2

## 2019-07-06 MED ORDER — FENTANYL CITRATE (PF) 100 MCG/2ML IJ SOLN
INTRAMUSCULAR | Status: DC | PRN
  Administered 2019-07-06: 50 ug via INTRAVENOUS

## 2019-07-06 MED ORDER — DIPHENHYDRAMINE HCL 50 MG/ML IJ SOLN: 50.0000 mg | Freq: Once | INTRAMUSCULAR | Status: DC | PRN

## 2019-07-06 MED ORDER — CEFAZOLIN SODIUM-DEXTROSE 1-4 GM/50ML-% IV SOLN
1.0000 g | Freq: Once | INTRAVENOUS | Status: AC
  Administered 2019-07-06: 16:00:00 1 g via INTRAVENOUS

## 2019-07-06 MED ORDER — MIDAZOLAM HCL 2 MG/2ML IJ SOLN
INTRAMUSCULAR | Status: DC | PRN
  Administered 2019-07-06: 2 mg via INTRAVENOUS

## 2019-07-06 MED ORDER — ALTEPLASE 2 MG IJ SOLR
INTRAMUSCULAR | Status: DC | PRN
  Administered 2019-07-06: 4 mg

## 2019-07-06 MED ORDER — MIDAZOLAM HCL 2 MG/ML PO SYRP: 8.0000 mg | ORAL_SOLUTION | Freq: Once | ORAL | Status: DC | PRN

## 2019-07-06 MED ORDER — MIDAZOLAM HCL 5 MG/5ML IJ SOLN
INTRAMUSCULAR | Status: AC
  Filled 2019-07-06: qty 5

## 2019-07-06 MED ORDER — HEPARIN SODIUM (PORCINE) 1000 UNIT/ML IJ SOLN
INTRAMUSCULAR | Status: DC | PRN
  Administered 2019-07-06: 4000 [IU] via INTRAVENOUS

## 2019-07-06 MED ORDER — METHYLPREDNISOLONE SODIUM SUCC 125 MG IJ SOLR: 125.0000 mg | Freq: Once | INTRAMUSCULAR | Status: DC | PRN

## 2019-07-06 MED ORDER — SODIUM CHLORIDE 0.9 % IV SOLN
INTRAVENOUS | Status: DC
  Administered 2019-07-06: 15:00:00 via INTRAVENOUS

## 2019-07-06 MED ORDER — IODIXANOL 320 MG/ML IV SOLN
INTRAVENOUS | Status: DC | PRN
  Administered 2019-07-06: 40 mL via INTRA_ARTERIAL

## 2019-07-06 MED ORDER — HEPARIN SODIUM (PORCINE) 1000 UNIT/ML IJ SOLN
INTRAMUSCULAR | Status: AC
  Filled 2019-07-06: qty 1

## 2019-07-06 MED ORDER — FAMOTIDINE 20 MG PO TABS: 40.0000 mg | ORAL_TABLET | Freq: Once | ORAL | Status: DC | PRN

## 2019-07-06 SURGICAL SUPPLY — 12 items
BALLN DORADO 8X100X80 (BALLOONS) ×2
BALLOON DORADO 8X100X80 (BALLOONS) ×1 IMPLANT
CANISTER PENUMBRA ENGINE (MISCELLANEOUS) ×2 IMPLANT
CANNULA 5F STIFF (CANNULA) ×2 IMPLANT
CATH EMBOLECTOMY 5FR (BALLOONS) ×2 IMPLANT
CATH INDIGO D 50CM (CATHETERS) ×2 IMPLANT
DEVICE PRESTO INFLATION (MISCELLANEOUS) ×2 IMPLANT
PACK ANGIOGRAPHY (CUSTOM PROCEDURE TRAY) ×2 IMPLANT
SHEATH BRITE TIP 6FRX5.5 (SHEATH) ×4 IMPLANT
SHEATH BRITE TIP 8FR 5.5 (SHEATH) ×4 IMPLANT
SUT MNCRL AB 4-0 PS2 18 (SUTURE) ×2 IMPLANT
WIRE MAGIC TOR.035 180C (WIRE) ×4 IMPLANT

## 2019-07-06 NOTE — Progress Notes (Signed)
Dr. Lucky Cowboy at [facility] speaking with pt. & his daughter. Both verbalize understanding of conversaion. Return appt. Made. +thrill, + bruit now to left upper arm fistula site.

## 2019-07-06 NOTE — Progress Notes (Signed)
Dr. Lucky Cowboy speaking with pt. And his daughter pre-procedure.

## 2019-07-06 NOTE — Telephone Encounter (Signed)
Misty from [facility] faxed and called requesting a patient be seen today for a fistula declot. Patient has been scheduled with Dr. Lucky Cowboy for 07/06/2019 with a 3:00 pm arrival time to the MM. Patient will do his Covid testing 10/26/200 at 10:00 am for MAB. Misty was called and given the pre-procedure instructions for the patient and stated she will call him and give him the information.

## 2019-07-06 NOTE — H&P (Signed)
[name] Admission History & Physical  MRN : [id number]  Frank [name] is a 78 y.o. ([birth year]) male who presents with chief complaint of "non-functioning dialysis access".   History of Present Illness:  I am asked to evaluate the patient by his dialysis center. The patient was sent here because they were unable to cannulate his left upper extremity dialysis access this morning. Furthermore the Center states there is no thrill or bruit. The patient states this is the first dialysis run to be missed. This problem is acute in onset and has been present for approximately one day. The patient is unaware of any other change.  Patient denies pain or tenderness overlying the access.  There is no pain with dialysis.  The patient denies hand pain or finger pain consistent with steal syndrome.   The patient is a patient at [facility] in [place].  Patient presents for a left upper extremity dialysis access declot with possible permcath insertion.   Current Facility-Administered Medications  Medication Dose Route Frequency Provider Last Rate Last Dose  . ceFAZolin (ANCEF) 1-4 GM/50ML-% IVPB           . 0.9 %  sodium chloride infusion   Intravenous Continuous Kris Hartmann, NP      . ceFAZolin (ANCEF) IVPB 1 g/50 mL premix  1 g Intravenous Once Kris Hartmann, NP      . diphenhydrAMINE (BENADRYL) injection 50 mg  50 mg Intravenous Once PRN Kris Hartmann, NP      . famotidine (PEPCID) tablet 40 mg  40 mg Oral Once PRN Kris Hartmann, NP      . HYDROmorphone (DILAUDID) injection 1 mg  1 mg Intravenous Once PRN Eulogio Ditch E, NP      . methylPREDNISolone sodium succinate (SOLU-MEDROL) 125 mg/2 mL injection 125 mg  125 mg Intravenous Once PRN Eulogio Ditch E, NP      . midazolam (VERSED) 2 MG/ML syrup 8 mg  8 mg Oral Once PRN Kris Hartmann, NP      . ondansetron (ZOFRAN) injection 4 mg  4 mg Intravenous Q6H PRN Kris Hartmann, NP       Past  Medical History:  Diagnosis Date  . Anemia of chronic kidney failure   . Arthritis   . Bradycardia   . Chronic kidney disease    [facility]  . Depression   . Diabetes mellitus without complication (HCC)    diet controlled   . Diabetic nephropathy ([place])   . Gout   . Gout   . History of UTI    s/p foley in hospital 01/2019   . Hyperlipidemia   . Hypothyroidism   . Neuropathy   . Peripheral neuropathy   . Peripheral vascular disease ([place])   . Secondary hyperparathyroidism ([place])    Renal  . Thyroid disease    Past Surgical History:  Procedure Laterality Date  . AV FISTULA PLACEMENT Left 11/19/2017   Procedure: ARTERIOVENOUS (AV) FISTULA CREATION;  Surgeon: [name], MD;  Location: [place];  Service: Vascular;  Laterality: Left;  . BASCILIC VEIN TRANSPOSITION Left 06/04/2018   Procedure: BASILIC VEIN TRANSPOSITION ARM;  Surgeon: [name], MD;  Location: [place];  Service: Vascular;  Laterality: Left;  . COLONOSCOPY    . DIALYSIS/PERMA CATHETER REMOVAL N/A 03/23/2019   Procedure: DIALYSIS/PERMA CATHETER REMOVAL;  Surgeon: [name], MD;  Location: [facility];  Service: Cardiovascular;  Laterality: N/A;  .  EYE SURGERY     cataract surgery b/l; photophobia since   . INSERTION OF DIALYSIS CATHETER N/A 01/23/2019   Procedure: INSERTION OF DIALYSIS CATHETER;  Surgeon: [name], MD;  Location: [facility];  Service: Vascular;  Laterality: N/A;  INSERTION OF DIALYSIS CATHETER  . KNEE ARTHROSCOPY Left    Knee  . THROMBECTOMY W/ EMBOLECTOMY Left 01/27/2019   Procedure: THROMBECTOMY Left arm ARTERIOVENOUS FISTULA  and Left arm Arteriovenous gortex graft;  Surgeon: [name], MD;  Location: [place];  Service: Vascular;  Laterality: Left;  Marland Kitchen VENOGRAM Left 01/27/2019   Procedure: Left arm FISTULOGRAM/;  Surgeon: [name], MD;  Location: [facility] OR;  Service: Vascular;  Laterality: Left;   Social History Social History   Tobacco Use  .  Smoking status: Former Smoker    Years: 30.00    Quit date: 1994    Years since quitting: 26.8  . Smokeless tobacco: Never Used  Substance Use Topics  . Alcohol use: No  . Drug use: No   Family History Family History  Problem Relation Age of Onset  . Diabetes Mother   . Kidney disease Mother   . Liver disease Mother   . Heart Problems Mother   No family history of bleeding or clotting disorders, autoimmune disease or porphyria.  Allergies  Allergen Reactions  . Ace Inhibitors Other (See Comments)    Hyperkalemia on ACE INHIBITORS Cr>1.9  . Angiotensin Receptor Blockers Other (See Comments)    Hyperkalemia Elevates Cr > 1.9   . Ivp Dye [Iodinated Diagnostic Agents] Other (See Comments)    Cannot have due to renal failure  . Adhesive [Tape] Other (See Comments)    UNDESCRIBED REACTION Can tolerate ONLY Coban wrap or mild paper tape!!   REVIEW OF SYSTEMS (Negative unless checked)  Constitutional: [] Weight loss  [] Fever  [] Chills Cardiac: [] Chest pain   [] Chest pressure   [] Palpitations   [] Shortness of breath when laying flat   [] Shortness of breath at rest   [x] Shortness of breath with exertion. Vascular:  [] Pain in legs with walking   [] Pain in legs at rest   [] Pain in legs when laying flat   [] Claudication   [] Pain in feet when walking  [] Pain in feet at rest  [] Pain in feet when laying flat   [] History of DVT   [] Phlebitis   [x] Swelling in legs   [] Varicose veins   [] Non-healing ulcers Pulmonary:   [] Uses home oxygen   [] Productive cough   [] Hemoptysis   [] Wheeze  [] COPD   [] Asthma Neurologic:  [] Dizziness  [] Blackouts   [] Seizures   [] History of stroke   [] History of TIA  [] Aphasia   [] Temporary blindness   [] Dysphagia   [] Weakness or numbness in arms   [] Weakness or numbness in legs Musculoskeletal:  [] Arthritis   [] Joint swelling   [] Joint pain   [] Low back pain Hematologic:  [] Easy bruising  [] Easy bleeding   [] Hypercoagulable state   [x] Anemic   [] Hepatitis Gastrointestinal:  [] Blood in stool   [] Vomiting blood  [] Gastroesophageal reflux/heartburn   [] Difficulty swallowing. Genitourinary:  [x] Chronic kidney disease   [] Difficult urination  [] Frequent urination  [] Burning with urination   [] Blood in urine Skin:  [] Rashes   [] Ulcers   [] Wounds Psychological:  [] History of anxiety   []  History of major depression.  Physical Examination  There were no vitals filed for this visit. There is no height or weight on file to calculate BMI. Gen: WD/WN, NAD Head: [place]/AT, No temporalis wasting.  Prominent temp pulse not noted. Ear/Nose/Throat: Hearing grossly intact, nares w/o erythema or drainage, oropharynx w/o Erythema/Exudate,  Eyes: Conjunctiva clear, sclera non-icteric Neck: Trachea midline.  No JVD.  Pulmonary:  Good air movement, respirations not labored, no use of accessory muscles.  Cardiac: RRR, normal S1, S2. Vascular:  Vessel Right Left  Radial Palpable Palpable  Ulnar Not Palpable Not Palpable  Brachial Palpable Palpable  Carotid Palpable, without bruit Palpable, without bruit   Left Upper Extremity:   Dialysis Access: No bruit or thrill. Hand warm.   No vascular compromise to the extremity at this time.   Gastrointestinal: soft, non-tender/non-distended. No guarding/reflex.  Musculoskeletal: M/S 5/5 throughout.  Extremities without ischemic changes.  No deformity or atrophy.  Neurologic: Sensation grossly intact in extremities.  Symmetrical.  Speech is fluent. Motor exam as listed above. Psychiatric: Judgment intact, Mood & affect appropriate for pt's clinical situation. Dermatologic: No rashes or ulcers noted.  No cellulitis or open wounds. Lymph : No Cervical, Axillary, or Inguinal lymphadenopathy.  CBC Lab Results  Component Value Date   WBC 10.0 05/11/2019   HGB 12.5 (L) 05/11/2019   HCT 37.5 05/11/2019   MCV 92 05/11/2019   PLT 169 05/11/2019   BMET    Component Value Date/Time   NA 132 (L) 06/10/2019 1208    NA 129 (L) 05/11/2019 1037   K 4.0 06/10/2019 1208   CL 88 (L) 06/10/2019 1208   CO2 33 (H) 06/10/2019 1208   GLUCOSE 245 (H) 06/10/2019 1208   BUN 56 (H) 06/10/2019 1208   BUN 79 (HH) 05/11/2019 1037   CREATININE 5.70 (HH) 06/10/2019 1208   CALCIUM 10.2 06/10/2019 1208   GFRNONAA 8 (L) 05/11/2019 1037   GFRAA 9 (L) 05/11/2019 1037   CrCl cannot be calculated (Patient's most recent lab result is older than the maximum 21 days allowed.).  COAG Lab Results  Component Value Date   INR 1.1 01/27/2019   Radiology Dg Cervical Spine Complete  Result Date: 06/10/2019 CLINICAL DATA:  Neck pain EXAM: CERVICAL SPINE - COMPLETE 4+ VIEW COMPARISON:  None. FINDINGS: Seven cervical segments are well visualized. Vertebral body height is well maintained. No acute fracture or acute facet abnormality is noted. Mild facet hypertrophic changes and osteophytic changes are seen. No significant neural foraminal changes noted. The odontoid is within normal limits. Visualized lung apices and soft tissues are unremarkable. IMPRESSION: Multilevel degenerative change without acute abnormality. Electronically Signed   By: Inez Catalina M.D.   On: 06/10/2019 17:02   Dg Thoracic Spine 2 View  Result Date: 06/10/2019 CLINICAL DATA:  Back pain, initial encounter EXAM: THORACIC SPINE 2 VIEWS COMPARISON:  None. FINDINGS: Multilevel osteophytic changes are noted. No compression deformities are seen. No paraspinal mass is noted. IMPRESSION: Degenerative change without acute abnormality. Electronically Signed   By: Inez Catalina M.D.   On: 06/10/2019 17:03   Dg Lumbar Spine Complete  Result Date: 06/10/2019 CLINICAL DATA:  Low back pain, initial encounter EXAM: LUMBAR SPINE - COMPLETE 4+ VIEW COMPARISON:  None. FINDINGS: Five lumbar type vertebral bodies are well visualized. Vertebral body height is well maintained. Mild facet hypertrophic changes and osteophytic changes are seen. No anterolisthesis is noted. Disc space  narrowing at L4-5 and L5-S1 is seen. IMPRESSION: Multilevel degenerative change without acute abnormality. Electronically Signed   By: Inez Catalina M.D.   On: 06/10/2019 17:04   Ct Chest Wo Contrast  Result Date: 06/26/2019 CLINICAL DATA:  Unintended weight loss. EXAM: CT CHEST, ABDOMEN, AND PELVIS  WITH CONTRAST TECHNIQUE: Multidetector CT imaging of the chest, abdomen and pelvis was performed following the standard protocol during bolus administration of intravenous contrast. CONTRAST:  18mL OMNIPAQUE IOHEXOL 300 MG/ML  SOLN COMPARISON:  None. FINDINGS: CT CHEST FINDINGS Cardiovascular: The heart size is normal. No substantial pericardial effusion. Coronary artery calcification is evident. Atherosclerotic calcification is noted in the wall of the thoracic aorta. Ascending thoracic aorta measures 4.2 cm diameter. Mediastinum/Nodes: No mediastinal lymphadenopathy. There is no axillary lymphadenopathy. No evidence for gross hilar lymphadenopathy although assessment is limited by the lack of intravenous contrast on today's study. The esophagus has normal imaging features. Lungs/Pleura: Centrilobular emphsyema noted. Bilateral tiny pulmonary nodules are identified, measuring up to about 3 mm and most of which appear calcified (representative example central left upper lobe on 56/3). No overtly suspicious nodule or mass. There is some atelectasis in the right base with asymmetric elevation right hemidiaphragm. No pleural effusion. Musculoskeletal: No worrisome lytic or sclerotic osseous abnormality. Mild bilateral gynecomastia noted. CT ABDOMEN PELVIS FINDINGS Hepatobiliary: No suspicious focal abnormality within the liver parenchyma. There is no evidence for gallstones, gallbladder wall thickening, or pericholecystic fluid. No intrahepatic or extrahepatic biliary dilation. Pancreas: No focal mass lesion. No dilatation of the main duct. No intraparenchymal cyst. No peripancreatic edema. Spleen: No splenomegaly. No  focal mass lesion. Adrenals/Urinary Tract: No adrenal nodule or mass. Cortical scarring noted in the kidneys bilaterally. No worrisome Renal mass lesion. No hydronephrosis. No evidence for hydroureter. Bladder is incompletely distended which likely accentuates the circumferential bladder wall thickening. Soft tissue projecting in the lumen of the posterior bladder base likely reflects prostate hypertrophy. Stomach/Bowel: Stomach is unremarkable. No gastric wall thickening. No evidence of outlet obstruction. Duodenum is normally positioned as is the ligament of Treitz. Small duodenal diverticulum noted. No small bowel wall thickening. No small bowel dilatation. The terminal ileum is normal. The appendix is normal. No gross colonic mass. No colonic wall thickening. Vascular/Lymphatic: There is abdominal aortic atherosclerosis without aneurysm. 11 mm short axis hepato duodenal ligament lymph node on 28/2 is upper normal to mildly increased. No retroperitoneal lymphadenopathy. No pelvic sidewall lymphadenopathy. Reproductive: Prostate gland is enlarged. Other: No intraperitoneal free fluid. Musculoskeletal: No worrisome lytic or sclerotic osseous abnormality. IMPRESSION: 1. No acute findings in the chest, abdomen, or pelvis. Specifically, no findings to explain the patient's history of weight loss 2. Tiny bilateral pulmonary nodules, many of which are calcified. No follow-up needed if patient is low-risk (and has no known or suspected primary neoplasm). Non-contrast chest CT can be considered in 12 months if patient is high-risk. This recommendation follows the consensus statement: Guidelines for Management of Incidental Pulmonary Nodules Detected on CT Images: From the Fleischner Society 2017; Radiology 2017; 284:228-243. 3. Prostatomegaly. Mild circumferential wall thickening is noted in the under distended bladder and a component of bladder outlet obstruction not excluded. Soft tissue projects into the lumen of the  inferior posterior bladder likely related to prostate hypertrophy but urothelial lesion not excluded. 4. Aortic Atherosclerosis (ICD10-I70.0) and Emphysema (ICD10-J43.9). Electronically Signed   By: Misty Stanley M.D.   On: 06/26/2019 13:27   Ct Abdomen Pelvis W Contrast  Result Date: 06/26/2019 CLINICAL DATA:  Unintended weight loss. EXAM: CT CHEST, ABDOMEN, AND PELVIS WITH CONTRAST TECHNIQUE: Multidetector CT imaging of the chest, abdomen and pelvis was performed following the standard protocol during bolus administration of intravenous contrast. CONTRAST:  131mL OMNIPAQUE IOHEXOL 300 MG/ML  SOLN COMPARISON:  None. FINDINGS: CT CHEST FINDINGS Cardiovascular: The heart size is  normal. No substantial pericardial effusion. Coronary artery calcification is evident. Atherosclerotic calcification is noted in the wall of the thoracic aorta. Ascending thoracic aorta measures 4.2 cm diameter. Mediastinum/Nodes: No mediastinal lymphadenopathy. There is no axillary lymphadenopathy. No evidence for gross hilar lymphadenopathy although assessment is limited by the lack of intravenous contrast on today's study. The esophagus has normal imaging features. Lungs/Pleura: Centrilobular emphsyema noted. Bilateral tiny pulmonary nodules are identified, measuring up to about 3 mm and most of which appear calcified (representative example central left upper lobe on 56/3). No overtly suspicious nodule or mass. There is some atelectasis in the right base with asymmetric elevation right hemidiaphragm. No pleural effusion. Musculoskeletal: No worrisome lytic or sclerotic osseous abnormality. Mild bilateral gynecomastia noted. CT ABDOMEN PELVIS FINDINGS Hepatobiliary: No suspicious focal abnormality within the liver parenchyma. There is no evidence for gallstones, gallbladder wall thickening, or pericholecystic fluid. No intrahepatic or extrahepatic biliary dilation. Pancreas: No focal mass lesion. No dilatation of the main duct. No  intraparenchymal cyst. No peripancreatic edema. Spleen: No splenomegaly. No focal mass lesion. Adrenals/Urinary Tract: No adrenal nodule or mass. Cortical scarring noted in the kidneys bilaterally. No worrisome Renal mass lesion. No hydronephrosis. No evidence for hydroureter. Bladder is incompletely distended which likely accentuates the circumferential bladder wall thickening. Soft tissue projecting in the lumen of the posterior bladder base likely reflects prostate hypertrophy. Stomach/Bowel: Stomach is unremarkable. No gastric wall thickening. No evidence of outlet obstruction. Duodenum is normally positioned as is the ligament of Treitz. Small duodenal diverticulum noted. No small bowel wall thickening. No small bowel dilatation. The terminal ileum is normal. The appendix is normal. No gross colonic mass. No colonic wall thickening. Vascular/Lymphatic: There is abdominal aortic atherosclerosis without aneurysm. 11 mm short axis hepato duodenal ligament lymph node on 28/2 is upper normal to mildly increased. No retroperitoneal lymphadenopathy. No pelvic sidewall lymphadenopathy. Reproductive: Prostate gland is enlarged. Other: No intraperitoneal free fluid. Musculoskeletal: No worrisome lytic or sclerotic osseous abnormality. IMPRESSION: 1. No acute findings in the chest, abdomen, or pelvis. Specifically, no findings to explain the patient's history of weight loss 2. Tiny bilateral pulmonary nodules, many of which are calcified. No follow-up needed if patient is low-risk (and has no known or suspected primary neoplasm). Non-contrast chest CT can be considered in 12 months if patient is high-risk. This recommendation follows the consensus statement: Guidelines for Management of Incidental Pulmonary Nodules Detected on CT Images: From the Fleischner Society 2017; Radiology 2017; 284:228-243. 3. Prostatomegaly. Mild circumferential wall thickening is noted in the under distended bladder and a component of bladder  outlet obstruction not excluded. Soft tissue projects into the lumen of the inferior posterior bladder likely related to prostate hypertrophy but urothelial lesion not excluded. 4. Aortic Atherosclerosis (ICD10-I70.0) and Emphysema (ICD10-J43.9). Electronically Signed   By: Misty Stanley M.D.   On: 06/26/2019 13:27   Assessment/Plan Frank [name] is a 78 y.o. (1940/12/19) male who presents with chief complaint of "non-functioning dialysis access".   1.  Complication dialysis device with thrombosis AV access:  Patient's left dialysis access is thrombosed. The patient will undergo thrombectomy using interventional techniques.  The risks and benefits were described to the patient.  All questions were answered.  The patient agrees to proceed with angiography and intervention. Potassium will be drawn to ensure that it is an appropriate level prior to performing thrombectomy. 2.  End-stage renal disease requiring hemodialysis:  Patient will continue dialysis therapy without further interruption if a successful thrombectomy is not achieved then catheter  will be placed. Dialysis has already been arranged since the patient missed their previous session 3.  Hypertension:  Patient will continue medical management; nephrology is following no changes in oral medications. 4. Diabetes mellitus:  Glucose will be monitored and oral medications been held this morning once the patient has undergone the patient's procedure po intake will be reinitiated and again Accu-Cheks will be used to assess the blood glucose level and treat as needed. The patient will be restarted on the patient's usual hypoglycemic regime 5.  Coronary artery disease:  EKG will be monitored. Nitrates will be used if needed. The patient's oral cardiac medications will be continued.  Discussed with Dr. Mayme Genta, PA-C  07/06/2019 2:37 PM

## 2019-07-06 NOTE — Telephone Encounter (Signed)
Hi [place], we don't have the rapid COVID tests available, so if Mr. Swearinger needs his procedure this afternoon, he needs to be treated as though he is COVID-positive. The earliest his results would be back if he gets swabbed today would be early tomorrow morning. please call the patient to let them know how to proceed. We have not swabbed him. Lura Em  RN [id number] Josephina Shih, RN  Ok thanks, I will let Mickel Baas know Eulogio Ditch, NP  I will call Misty at [facility] and let her know the patient cannot have a test until tomorrow. Me do you want [place] to go ahead and swab him if you're going to reschedule him til tomorrow? Josephina Shih, RN Yes that will work Me ok will do, thanks! Josephina Shih, RN Spoke with [facility] and she has the information about his procedure tomorrow. Me Lissa (our Freight forwarder) approved him to get the rapid test today, so his procedure can go as planned this afternoon.will you please call the patient's daughter to let them know? there was definitely some back and forth so i don't want them to miss the procedure today Josephina Shih, RN Spoke with the daughter and gave her the info Me

## 2019-07-06 NOTE — Op Note (Signed)
[facility]    OPERATIVE NOTE   PROCEDURE: 1.  Left brachial artery to axillary vein arteriovenous graft cannulation under ultrasound guidance in both a retrograde and then antegrade fashion crossing 2.  Left arm shuntogram and central venogram 3.  Catheter directed thrombolysis with 4 mg of TPA  4.  Mechanical thrombectomy to the left brachial artery to axillary vein AV graft with the penumbra CAT D device 5.  Fogarty embolectomy for residual arterial plug 6.  Percutaneous transluminal angioplasty of venous anastomosis and distal graft with 8 mm diameter high-pressure angioplasty balloon  PRE-OPERATIVE DIAGNOSIS: 1. ESRD 2.  Thrombosed left brachial artery to axillary vein arteriovenous graft  POST-OPERATIVE DIAGNOSIS: same as above   SURGEON: Leotis Pain, MD  ANESTHESIA: local with Moderate Conscious Sedation for approximately 25 minutes using 2 mg of Versed and 50 mcg of Fentanyl  ESTIMATED BLOOD LOSS: 100 cc  FINDING(S): 1. Clotted graft with a venous anastomotic stenosis  SPECIMEN(S):  None  CONTRAST: 40 cc  FLUORO TIME: 2.4 minutes  INDICATIONS: Patient is a 78 y.o.male who presents with a thrombosed left brachial artery to axillary vein arteriovenous graft.  The patient is scheduled for an attempted declot and shuntogram.  The patient is aware the risks include but are not limited to: bleeding, infection, thrombosis of the cannulated access, and possible anaphylactic reaction to the contrast.  The patient is aware of the risks of the procedure and elects to proceed forward.  DESCRIPTION: After full informed written consent was obtained, the patient was brought back to the angiography suite and placed supine upon the angiography table.  The patient was connected to monitoring equipment. Moderate conscious sedation was administered with a face to face encounter with the patient throughout the procedure with my supervision of the RN administering  medicines and monitoring the patient's vital signs, pulse oximetry, telemetry and mental status throughout from the start of the procedure until the patient was taken to the recovery room. The left arm was prepped and draped in the standard fashion for a percutaneous access intervention.  Under ultrasound guidance, the left brachial artery axillary vein arteriovenous graft was cannulated with a micropuncture needle under direct ultrasound guidance due to the pulseless nature of the graft in both an antegrade and a retrograde fashion crossing, and permanent images were performed.  The microwire was advanced and the needle was exchanged for the a microsheath.  I then upsized to a 6 Fr Sheath and imaging was performed.  Hand injections were completed to image the access including the central venous system. This demonstrated no flow within the AV graft.  Based on the images, this patient will need extensive treatment to salvage the graft. I then gave the patient 4000 units of intravenous heparin.  I then placed a Magic torque wire into the brachial artery from the retrograde sheath and into the axillary vein from the antegrade sheath. 4 mg of TPA were deployed through the sheaths. This was allowed to dwell.  An arterial plug was also seen at the arterial anastomosis and the initial portion of the graft. An attempt to clear the arterial plug was done with 2 passes of the Fogarty embolectomy balloon. This resulted in resolution of the arterial plug, and clearance of the arterial side of the graft. The arterial outflow was seen to be intact as well on these images. The retrograde sheath was removed. I then turned my attention to the thrombus in the distal graft and the axillary vein.  I upsized to an 8 French sheath with the antegrade sheath.  Mechanical thrombectomy was performed with the penumbra CAT D device pulling out large amounts of thrombus from the mid to distal graft and into the axillary vein. This resulted in  marked improvement although there remained a 70 to 80% stenosis at the venous anastomosis as well as a 70 to 75% stenosis in the mid to distal graft.  I then elected to treat this with an 8 mm diameter by 10 cm length high-pressure angioplasty balloon inflated to 20 atm for 1 minute.  Completion imaging showed less than 10% residual stenosis in the graft and at the venous anastomosis with no significant residual thrombus.  The central venous circulation was widely patent.    Based on the completion imaging, no further intervention is necessary.  The wire and balloon were removed from the sheath.  A 4-0 Monocryl purse-string suture was sewn around the sheath.  The sheath was removed while tying down the suture.  A sterile bandage was applied to the puncture site.  COMPLICATIONS: None  CONDITION: Stable     07/06/2019 4:22 PM   This note was created with Dragon Medical transcription system. Any errors in dictation are purely unintentional. 

## 2019-07-07 ENCOUNTER — Encounter: Payer: Self-pay | Admitting: Vascular Surgery

## 2019-07-13 ENCOUNTER — Telehealth: Payer: Self-pay | Admitting: Internal Medicine

## 2019-07-13 NOTE — Telephone Encounter (Signed)
Pt c/o BP issue: STAT if pt c/o blurred vision, one-sided weakness or slurred speech  1. What are your last 5 BP readings? no  2. Are you having any other symptoms (ex. Dizziness, headache, blurred vision, passed out)? Dizziness and light headedness and seems like the lights are going out. States he has not passed out.  3. What is your BP issue? Patients blood pressure continues to drop during his dialysis treatments. Was advised to schedule an appt with his cardiologist. Appt has been schedule with Donnajean Lopes on 07/17/19

## 2019-07-13 NOTE — Telephone Encounter (Signed)
Patient has been getting lightheaded and dizzy during dialysis. He does not have these symptoms at any other time. His BP and HR have also been dropping during dialysis. HR goes down to 40's and 50's. Systolic BP goes down to [id number]. Patient regularly takes his own BP/HR. HR stays in 60s-70s and BP remains normal with occasional drops in systolic to low [id number]. Patient denies SOB, chest pain, palpitations, or passing out. Pt was advised by the dialysis clinic to follow-up with cardiology. Pt has an appointment with Tommye Standard, PA-C on 11/06, will forward to make her aware.

## 2019-07-16 NOTE — Progress Notes (Deleted)
Cardiology Office Note Date:  07/16/2019  Patient ID:  Frank [name] 04-25-1941, MRN [id number] PCP:  McLean-Scocuzza, Nino Glow, MD  Electrophysiologist:  Dr. Rayann Heman  ***refresh   Chief Complaint: *** BP concerns  History of Present Illness: Frank [name] is a 78 y.o. 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 Joylene Grapes, last seen by him via tele-health visit in April.  Was doing well, no changes were made, no symptoms of bradycardia, no indication for pacing  More recently he underwent thrombolysis/TPA, mechanical thrombectomy, embolectomy, PTA of his left brachial artery to axillary vein arteriovenous graft on 07/06/2019 with Dr. Lucky Cowboy.  *** brady symptoms? *** BP? *** meds *** avoid nodal blocking drugs *** labs, lipids...   Past Medical History:  Diagnosis Date  . Anemia of chronic kidney failure   . Arthritis   . Bradycardia   . Chronic kidney disease    [facility]  . Depression   . Diabetes mellitus without complication (HCC)    diet controlled   . Diabetic nephropathy ([place])   . Gout   . Gout   . History of UTI    s/p foley in hospital 01/2019   . Hyperlipidemia   . Hypothyroidism   . Neuropathy   . Peripheral neuropathy   . Peripheral vascular disease ([place])   . Secondary hyperparathyroidism ([place])    Renal  . Thyroid disease     Past Surgical History:  Procedure Laterality Date  . AV FISTULA PLACEMENT Left 11/19/2017   Procedure: ARTERIOVENOUS (AV) FISTULA CREATION;  Surgeon: [name], MD;  Location: [facility];  Service: Vascular;  Laterality: Left;  . BASCILIC VEIN TRANSPOSITION Left 06/04/2018   Procedure: BASILIC VEIN TRANSPOSITION ARM;  Surgeon: [name], MD;  Location: [facility];  Service: Vascular;  Laterality: Left;  . COLONOSCOPY    . DIALYSIS/PERMA CATHETER REMOVAL N/A 03/23/2019   Procedure: DIALYSIS/PERMA CATHETER REMOVAL;  Surgeon: [name], MD;  Location: [facility];  Service: Cardiovascular;  Laterality: N/A;  . EYE SURGERY     cataract surgery b/l; photophobia since   . INSERTION OF DIALYSIS CATHETER N/A 01/23/2019   Procedure: INSERTION OF DIALYSIS CATHETER;  Surgeon: [name], MD;  [facility];  Service: Vascular;  Laterality: N/A;  INSERTION OF DIALYSIS CATHETER  . KNEE ARTHROSCOPY Left    Knee  . PERIPHERAL VASCULAR THROMBECTOMY Left 07/06/2019   Procedure: PERIPHERAL VASCULAR THROMBECTOMY;  Surgeon: [name], MD;  Location: [facility];  Service: Cardiovascular;  Laterality: Left;  . THROMBECTOMY W/ EMBOLECTOMY Left 01/27/2019   Procedure: THROMBECTOMY Left arm ARTERIOVENOUS FISTULA  and Left arm Arteriovenous gortex graft;  Surgeon: [name], MD;  Location: [facility];  Service: Vascular;  Laterality: Left;  Marland Kitchen VENOGRAM Left 01/27/2019   Procedure: Left arm FISTULOGRAM/;  Surgeon: [name], MD;  Location: [facility];  Service: Vascular;  Laterality: Left;    Current Outpatient Medications  Medication Sig Dispense Refill  . allopurinol (ZYLOPRIM) 100 MG tablet Take 1 tablet (100 mg total) by mouth daily. 90 tablet 3  . aspirin EC 81 MG tablet Take 81 mg daily by mouth.    Marland Kitchen atorvastatin (LIPITOR) 20 MG  tablet Take 20 mg by mouth daily at 6 PM.     . AURYXIA 1 GM 210 MG(Fe) tablet Take 210 mg by mouth 3 (three) times daily with meals.     . docusate sodium (COLACE) 100 MG capsule Take 200 mg by mouth daily as needed.     . gabapentin (NEURONTIN) 300 MG capsule Take 1 capsule (300 mg total) by mouth daily. At 3 PM 90 capsule 3  . ketoconazole (NIZORAL) 2 % shampoo Apply 1 application topically 2 (two) times a week. 120 mL 11  . levothyroxine (SYNTHROID) 175 MCG tablet  Take 1 tablet (175 mcg total) by mouth daily before breakfast. 30 minutes before breakfast (Patient taking differently: Take 150 mcg by mouth daily before breakfast. 30 minutes before breakfast) 90 tablet 3  . lidocaine-prilocaine (EMLA) cream APPLY SMALL AMOUNT TO ACCESS SITE (AVF) 1 TO 2 HOURS BEFORE DIALYSIS. COVER WITH OCCLUSIVE DRESSING (SARAN WRAP)    . multivitamin (RENA-VIT) TABS tablet Take 1 tablet daily by mouth.    . ondansetron (ZOFRAN) 4 MG tablet Take 4 mg by mouth every 8 (eight) hours as needed for nausea or vomiting.     . sevelamer carbonate (RENVELA) 800 MG tablet Take 2,400 mg by mouth 3 (three) times daily with meals. And with any snacks    . tamsulosin (FLOMAX) 0.4 MG CAPS capsule Take 0.8 mg by mouth daily after supper.    . Zinc Oxide (BALMEX EX) Apply 1 application topically daily as needed (to irritated areas of skin).      No current facility-administered medications for this visit.     Allergies:   Ace inhibitors, Angiotensin receptor blockers, Ivp dye [iodinated diagnostic agents], and Adhesive [tape]   Social History:  The patient  reports that he quit smoking about 26 years ago. He quit after 30.00 years of use. He has never used smokeless tobacco. He reports that he does not drink alcohol or use drugs.   Family History:  The patient's family history includes Diabetes in his mother; Heart Problems in his mother; Kidney disease in his mother; Liver disease in his mother.  ROS:  Please see the history of present illness.   All other systems are reviewed and otherwise negative.   PHYSICAL EXAM: *** VS:  There were no vitals taken for this visit. BMI: There is no height or weight on file to calculate BMI. Well nourished, well developed, in no acute distress  HEENT: normocephalic, atraumatic  Neck: no JVD, carotid bruits or masses Cardiac:  *** RRR; no significant murmurs, no rubs, or gallops Lungs:  *** CTA b/l, no wheezing, rhonchi or rales  Abd: soft,  nontender MS: no deformity or *** atrophy Ext: *** no edema  Skin: warm and dry, no rash Neuro:  No gross deficits appreciated Psych: euthymic mood, full affect   EKG:  Done today and reviewed by myself shows ***   01/22/2019; TTE IMPRESSIONS  1. The left ventricle has normal systolic function with an ejection fraction of 60-65%. The cavity size was normal. There is moderate concentric left ventricular hypertrophy. Left ventricular diastolic Doppler parameters are consistent with impaired  relaxation.  2. The right ventricle has normal systolic function. The cavity was normal. There is no increase in right ventricular wall thickness.  3. Left atrial size was mildly dilated.  4. The mitral valve is degenerative. Mild thickening of the mitral valve leaflet. There is mild mitral annular calcification present.  5. The aortic valve is tricuspid. Mild thickening  of the aortic valve. Mild calcification of the aortic valve. Aortic valve regurgitation was not assessed by color flow Doppler.  Recent Labs: 05/11/2019: Hemoglobin 12.5; Magnesium 2.3; Platelets 169; TSH 4.980 06/10/2019: ALT 67; BUN 56; Creatinine, Ser 5.70; Potassium 4.0; Sodium 132  05/11/2019: Chol/HDL Ratio 2.4; Cholesterol, Total 149; HDL 62; LDL Chol Calc (NIH) 72; Triglycerides 79   CrCl cannot be calculated (Patient's most recent lab result is older than the maximum 21 days allowed.).   Wt Readings from Last 3 Encounters:  07/06/19 200 lb 9.9 oz (91 kg)  06/10/19 199 lb 9.6 oz (90.5 kg)  03/23/19 197 lb (89.4 kg)     Other studies reviewed: Additional studies/records reviewed today include: summarized above  ASSESSMENT AND PLAN:  1. Conduction system disease     1st degree AVBlock, wenckebach     ***  2. BP    ***     Disposition: F/u with ***  Current medicines are reviewed at length with the patient today.  The patient did not have any concerns regarding medicines.***  Signed, Tommye Standard, PA-C  07/16/2019 6:28 AM     [facility] [address] [place] [address] [phone] (office)  [phone] (fax)

## 2019-07-17 ENCOUNTER — Ambulatory Visit: Payer: Medicare Other | Admitting: Physician Assistant

## 2019-07-23 NOTE — Progress Notes (Signed)
PCP:  McLean-Scocuzza, Nino Glow, MD Primary Cardiologist: No primary care provider on file. Electrophysiologist: Dr. Elfredia Nevins is a 78 y.o. male with past medical history of 1st degree AV block and ESRD on HD who presents today for visit due to reported hypotension on HD days. They are seen for Dr. Rayann Heman.   Since last being seen in our clinic, the patient reports doing well overall.  He does dialysis on Tue/Thurs/Saturday for the past 6 months. He has been having issues of low blood pressure, at times as low as the 70s, on HD days, and at times on his off days.  He is not on any antihypertensives or diuretics. His HD is frequently paused due to lightheadedness. His HR is usually in the 70-80s at these times.  There have been reports of HRs in the 40-50s, but there are not correlated with his episode of lightheadedness, and have been measured with a pulse oximeter at home (known PVCs likely confounding, Daughter is an Therapist, sports) and are typically asymptomatic.   The patient feels that he is tolerating medications without difficulties and is otherwise without complaint today.   Past Medical History:  Diagnosis Date  . Anemia of chronic kidney failure   . Arthritis   . Bradycardia   . Chronic kidney disease    [facility]  . Depression   . Diabetes mellitus without complication (HCC)    diet controlled   . Diabetic nephropathy ([place])   . Gout   . Gout   . History of UTI    s/p foley in hospital 01/2019   . Hyperlipidemia   . Hypothyroidism   . Neuropathy   . Peripheral neuropathy   . Peripheral vascular disease ([place])   . Secondary hyperparathyroidism ([place])    Renal  . Thyroid disease    Past Surgical History:  Procedure Laterality Date  . AV FISTULA PLACEMENT Left 11/19/2017   Procedure: ARTERIOVENOUS (AV) FISTULA CREATION;  Surgeon: [name], MD;  Location: [facility];  Service: Vascular;  Laterality: Left;  . BASCILIC VEIN TRANSPOSITION Left 06/04/2018   Procedure: BASILIC VEIN TRANSPOSITION ARM;  Surgeon: [name], MD;  Location: [facility];  Service: Vascular;  Laterality: Left;  . COLONOSCOPY    . DIALYSIS/PERMA CATHETER REMOVAL N/A 03/23/2019   Procedure: DIALYSIS/PERMA CATHETER REMOVAL;  Surgeon: [name], MD;  Location: [facility];  Service: Cardiovascular;  Laterality: N/A;  . EYE SURGERY     cataract surgery b/l; photophobia since   . INSERTION OF DIALYSIS CATHETER N/A 01/23/2019   Procedure: INSERTION OF DIALYSIS CATHETER;  Surgeon: [name], MD;  [facility];  Service: Vascular;  Laterality: N/A;  INSERTION OF DIALYSIS CATHETER  . KNEE ARTHROSCOPY Left    Knee  . PERIPHERAL VASCULAR THROMBECTOMY Left 07/06/2019   Procedure: PERIPHERAL VASCULAR THROMBECTOMY;  Surgeon: [name], MD;  Location: [facility];  Service: Cardiovascular;  Laterality: Left;  . THROMBECTOMY W/ EMBOLECTOMY Left 01/27/2019   Procedure: THROMBECTOMY Left arm ARTERIOVENOUS FISTULA  and Left arm Arteriovenous gortex graft;  Surgeon: [name], MD;  Location: [facility];  Service: Vascular;  Laterality: Left;  Marland Kitchen VENOGRAM Left 01/27/2019   Procedure: Left arm FISTULOGRAM/;  Surgeon: [name], MD;  Location[facility];  Service: Vascular;  Laterality: Left;    Current Outpatient Medications  Medication Sig Dispense Refill  . allopurinol (ZYLOPRIM) 100 MG tablet Take 1 tablet (100 mg total) by mouth  daily. 90 tablet 3  . aspirin EC 81 MG tablet Take 81 mg daily by mouth.    Marland Kitchen atorvastatin (LIPITOR) 20 MG tablet Take 20 mg by mouth daily at 6 PM.     . docusate sodium (COLACE) 100 MG capsule Take 200 mg by mouth daily as needed.     . gabapentin (NEURONTIN) 300 MG capsule Take 1 capsule (300 mg total) by mouth daily. At 3 PM 90 capsule 3  . ketoconazole (NIZORAL) 2 % shampoo Apply 1 application topically 2 (two) times a week. (Patient taking differently: Apply 1 application topically as needed. ) 120 mL 11  .  levothyroxine (SYNTHROID) 175 MCG tablet Take 1 tablet (175 mcg total) by mouth daily before breakfast. 30 minutes before breakfast 90 tablet 3  . lidocaine-prilocaine (EMLA) cream APPLY SMALL AMOUNT TO ACCESS SITE (AVF) 1 TO 2 HOURS BEFORE DIALYSIS. COVER WITH OCCLUSIVE DRESSING (SARAN WRAP)    . multivitamin (RENA-VIT) TABS tablet Take 1 tablet daily by mouth.    . ondansetron (ZOFRAN) 4 MG tablet Take 4 mg by mouth every 8 (eight) hours as needed for nausea or vomiting.     . sevelamer carbonate (RENVELA) 2.4 g PACK Take by mouth daily. And with any snacks     . tamsulosin (FLOMAX) 0.4 MG CAPS capsule Take 0.4 mg by mouth daily after supper.     . Zinc Oxide (BALMEX EX) Apply 1 application topically daily as needed (to irritated areas of skin).      No current facility-administered medications for this visit.     Allergies  Allergen Reactions  . Ace Inhibitors Other (See Comments)    Hyperkalemia on ACE INHIBITORS Cr>1.9  . Angiotensin Receptor Blockers Other (See Comments)    Hyperkalemia Elevates Cr > 1.9   . Ivp Dye [Iodinated Diagnostic Agents] Other (See Comments)    Cannot have due to renal failure  . Adhesive [Tape] Other (See Comments)    UNDESCRIBED REACTION Can tolerate ONLY Coban wrap or mild paper tape!!    Social History   Socioeconomic History  . Marital status: Divorced    Spouse name: Not on file  . Number of children: 4  . Years of education: Not on file  . Highest education level: 8th grade  Occupational History  . Not on file  Social Needs  . Financial resource strain: Not hard at all  . Food insecurity    Worry: Never true    Inability: Never true  . Transportation needs    Medical: No    Non-medical: No  Tobacco Use  . Smoking status: Former Smoker    Years: 30.00    Quit date: 1994    Years since quitting: 26.8  . Smokeless tobacco: Never Used  Substance and Sexual Activity  . Alcohol use: No  . Drug use: No  . Sexual activity: Not  Currently  Lifestyle  . Physical activity    Days per week: Not on file    Minutes per session: Not on file  . Stress: Only a little  Relationships  . Social connections    Talks on phone: More than three times a week    Gets together: Not on file    Attends religious service: Not on file    Active member of club or organization: Not on file    Attends meetings of clubs or organizations: Not on file    Relationship status: Divorced  . Intimate partner violence    Fear of  current or ex partner: No    Emotionally abused: No    Physically abused: No    Forced sexual activity: No  Other Topics Concern  . Not on file  Social History Narrative   4 kids (3 daughters and 1 son)   Daughter [name] DPR   Review of Systems: General: No chills, fever, night sweats or weight changes  Cardiovascular:  No chest pain, dyspnea on exertion, edema, orthopnea, palpitations, paroxysmal nocturnal dyspnea Dermatological: No rash, lesions or masses Respiratory: No cough, dyspnea Urologic: No hematuria, dysuria Abdominal: No nausea, vomiting, diarrhea, bright red blood per rectum, melena, or hematemesis Neurologic: No visual changes, weakness, changes in mental status All other systems reviewed and are otherwise negative except as noted above.  Physical Exam: Vitals:   07/24/19 0854  BP: (!) 90/44  Pulse: 77  Weight: 203 lb (92.1 kg)  Height: 5\' 8"  (1.727 m)    GEN- The patient is well appearing, alert and oriented x 3 today.   HEENT: normocephalic, atraumatic; sclera clear, conjunctiva pink; hearing intact; oropharynx clear; neck supple, no JVP Lymph- no cervical lymphadenopathy Lungs- Clear to ausculation bilaterally, normal work of breathing.  No wheezes, rales, rhonchi Heart- Regular rate and rhythm, no murmurs, rubs or gallops, PMI not laterally displaced GI- soft, non-tender, non-distended, bowel sounds present, no hepatosplenomegaly Extremities- no clubbing, cyanosis, or edema;  DP/PT/radial pulses 2+ bilaterally MS- no significant deformity or atrophy Skin- warm and dry, no rash or lesion Psych- euthymic mood, full affect Neuro- strength and sensation are intact  EKG is ordered. Personal review of EKG from today shows NSR with 1st degree block, 77 bpm, PR interval 356 ms.  Assessment and Plan:  1. 1st degree AV block Asymptomatic He has history of intermittent 2nd degree block in the setting of having his fistula placed. It is possible that midodrine may worsen his bradycardia due to vagal tone.    We discussed MDT MicraAV leadless pacemaker at length today, including, risks, benefits, procedure, and wound care, restrictions post op.  He and his daughter would prefer to hold off for now, and see how he tolerates midodrine as below.  They are aware this may cause bradycardia and no to call back with any concerns, questions, or worsening bradycardia.   2. Hypotension during HD Pt has HD on T/Th/Saturdays He is not on any hypertensive medications to adjust or hold. Discussed with Dr. Rayann Heman. We will start with lowest dose of midodrine, which is 2.5 mg 30 minutes prior to HD initiation. Can consider dose adjustment as tolerated.   I will see them back in 6-8 weeks to see how he is tolerating HD, and further discuss leadless PPM implantation.   Shirley Friar, PA-C  07/24/19 9:53 AM

## 2019-07-24 ENCOUNTER — Other Ambulatory Visit: Payer: Self-pay

## 2019-07-24 ENCOUNTER — Ambulatory Visit (INDEPENDENT_AMBULATORY_CARE_PROVIDER_SITE_OTHER): Payer: Medicare Other | Admitting: Student

## 2019-07-24 VITALS — BP 90/44 | HR 77 | Ht 68.0 in | Wt 203.0 lb

## 2019-07-24 DIAGNOSIS — I440 Atrioventricular block, first degree: Secondary | ICD-10-CM

## 2019-07-24 DIAGNOSIS — I959 Hypotension, unspecified: Secondary | ICD-10-CM

## 2019-07-24 DIAGNOSIS — I441 Atrioventricular block, second degree: Secondary | ICD-10-CM

## 2019-07-24 DIAGNOSIS — I2510 Atherosclerotic heart disease of native coronary artery without angina pectoris: Secondary | ICD-10-CM

## 2019-07-24 MED ORDER — MIDODRINE HCL 2.5 MG PO TABS: 2.5000 mg | ORAL_TABLET | ORAL | 1 refills | Status: DC

## 2019-07-24 NOTE — Patient Instructions (Signed)
Medication Instructions:   START TAKING: MIDODRINE 2.5 MG 30 MINUTES PRIOR TO DIALYSIS  *If you need a refill on your cardiac medications before your next appointment, please call your pharmacy*  Lab Work: NONE ORDERED  TODAY   If you have labs (blood work) drawn today and your tests are completely normal, you will receive your results only by: Marland Kitchen MyChart Message (if you have MyChart) OR . A paper copy in the mail If you have any lab test that is abnormal or we need to change your treatment, we will call you to review the results.  Testing/Procedures: NONE ORDERED  TODAY   Follow-Up: At [facility], you and your health needs are our priority.  As part of our continuing mission to provide you with exceptional heart care, we have created designated Provider Care Teams.  These Care Teams include your primary Cardiologist (physician) and Advanced Practice Providers (APPs -  Physician Assistants and Nurse Practitioners) who all work together to provide you with the care you need, when you need it.  Your next appointment:  6-8 weeks with  Joesph July  PA-C

## 2019-08-12 ENCOUNTER — Encounter (INDEPENDENT_AMBULATORY_CARE_PROVIDER_SITE_OTHER): Payer: Medicare Other

## 2019-08-12 ENCOUNTER — Ambulatory Visit (INDEPENDENT_AMBULATORY_CARE_PROVIDER_SITE_OTHER): Payer: Medicare Other | Admitting: Nurse Practitioner

## 2019-08-12 ENCOUNTER — Telehealth: Payer: Self-pay | Admitting: Student

## 2019-08-12 NOTE — Telephone Encounter (Signed)
New Message  Pt's daughter is calling to set up appt in January. January's schedule isn't showing up as being set up  Please call to discuss

## 2019-08-18 ENCOUNTER — Telehealth: Payer: Self-pay | Admitting: *Deleted

## 2019-08-18 NOTE — Telephone Encounter (Signed)
Copied from [place] [id number]. Topic: Appointment Scheduling - Scheduling Inquiry for Clinic >> Aug 18, 2019 10:45 AM Erick Blinks wrote: Reason for CRM: Pt's daughter called to reschedule (She has shingles, and is unable to assist him) Pt reports that he feels fine. Pt's daughter wants to know if PCP is okay with their reschedule to the next available 10/28/2019. Please advise  Best contact: [phone]

## 2019-08-18 NOTE — Telephone Encounter (Signed)
Ok yes to reschedule

## 2019-08-19 ENCOUNTER — Ambulatory Visit: Payer: Medicare Other | Admitting: Internal Medicine

## 2019-08-25 ENCOUNTER — Other Ambulatory Visit (INDEPENDENT_AMBULATORY_CARE_PROVIDER_SITE_OTHER): Payer: Self-pay | Admitting: Vascular Surgery

## 2019-08-25 DIAGNOSIS — N186 End stage renal disease: Secondary | ICD-10-CM

## 2019-08-25 DIAGNOSIS — Z9862 Peripheral vascular angioplasty status: Secondary | ICD-10-CM

## 2019-08-26 ENCOUNTER — Ambulatory Visit (INDEPENDENT_AMBULATORY_CARE_PROVIDER_SITE_OTHER): Payer: Medicare Other | Admitting: Nurse Practitioner

## 2019-08-26 ENCOUNTER — Other Ambulatory Visit: Payer: Self-pay

## 2019-08-26 ENCOUNTER — Ambulatory Visit (INDEPENDENT_AMBULATORY_CARE_PROVIDER_SITE_OTHER): Payer: Medicare Other

## 2019-08-26 ENCOUNTER — Encounter (INDEPENDENT_AMBULATORY_CARE_PROVIDER_SITE_OTHER): Payer: Self-pay | Admitting: Nurse Practitioner

## 2019-08-26 VITALS — BP 127/66 | HR 98 | Resp 16 | Wt 208.6 lb

## 2019-08-26 DIAGNOSIS — N186 End stage renal disease: Secondary | ICD-10-CM | POA: Diagnosis not present

## 2019-08-26 DIAGNOSIS — Z9862 Peripheral vascular angioplasty status: Secondary | ICD-10-CM

## 2019-08-26 DIAGNOSIS — I10 Essential (primary) hypertension: Secondary | ICD-10-CM

## 2019-08-26 DIAGNOSIS — Z992 Dependence on renal dialysis: Secondary | ICD-10-CM | POA: Diagnosis not present

## 2019-08-26 DIAGNOSIS — E119 Type 2 diabetes mellitus without complications: Secondary | ICD-10-CM

## 2019-09-02 ENCOUNTER — Encounter (INDEPENDENT_AMBULATORY_CARE_PROVIDER_SITE_OTHER): Payer: Self-pay | Admitting: Nurse Practitioner

## 2019-09-02 NOTE — Progress Notes (Signed)
SUBJECTIVE:  Patient ID: Frank [name], male    DOB: 1941-09-01, 78 y.o.   MRN: [id number] Chief Complaint  Patient presents with  . Follow-up    [facility] 4week ultrasound follow up    HPI  Frank [name] is a 78 y.o. male The patient returns to the office for followup status post intervention of the dialysis access left brachial axillary graft. Following the intervention the access function has significantly improved, with better flow rates and improved KT/V. The patient has not been experiencing increased bleeding times following decannulation and the patient denies increased recirculation. The patient denies an increase in arm swelling. At the present time the patient denies hand pain.  The patient denies amaurosis fugax or recent TIA symptoms. There are no recent neurological changes noted. The patient denies claudication symptoms or rest pain symptoms. The patient denies history of DVT, PE or superficial thrombophlebitis. The patient denies recent episodes of angina or shortness of breath.   The patient has a flow volume of 1830 with no evidence of stenosis.       Past Medical History:  Diagnosis Date  . Anemia of chronic kidney failure   . Arthritis   . Bradycardia   . Chronic kidney disease    [facility]  . Depression   . Diabetes mellitus without complication (HCC)    diet controlled   . Diabetic nephropathy ([place])   . Gout   . Gout   . History of UTI    s/p foley in hospital 01/2019   . Hyperlipidemia   . Hypothyroidism   . Neuropathy   . Peripheral neuropathy   . Peripheral vascular disease ([place])   . Secondary hyperparathyroidism ([place])    Renal  . Thyroid disease     Past Surgical History:  Procedure Laterality Date  . AV FISTULA PLACEMENT Left 11/19/2017   Procedure: ARTERIOVENOUS (AV) FISTULA CREATION;  Surgeon: [name], MD;  Location: [place];  Service: Vascular;  Laterality: Left;  . BASCILIC VEIN TRANSPOSITION Left 06/04/2018   Procedure: BASILIC VEIN TRANSPOSITION ARM;  Surgeon: [name], MD;  Location: [place];  Service: Vascular;  Laterality: Left;  . COLONOSCOPY    . DIALYSIS/PERMA CATHETER REMOVAL N/A 03/23/2019   Procedure: DIALYSIS/PERMA CATHETER REMOVAL;  Surgeon: [name], MD;  Location: [place] [facility];  Service: Cardiovascular;  Laterality: N/A;  . EYE SURGERY     cataract surgery b/l; photophobia since   . INSERTION OF DIALYSIS CATHETER N/A 01/23/2019   Procedure: INSERTION OF DIALYSIS CATHETER;  Surgeon: [name], MD;  Location: [facility];  Service: Vascular;  Laterality: N/A;  INSERTION OF DIALYSIS CATHETER  . KNEE ARTHROSCOPY Left    Knee  . PERIPHERAL VASCULAR THROMBECTOMY Left 07/06/2019   Procedure: PERIPHERAL VASCULAR THROMBECTOMY;  Surgeon: [name], MD;  Location: [place] [facility];  Service: Cardiovascular;  Laterality: Left;  . THROMBECTOMY W/ EMBOLECTOMY Left 01/27/2019   Procedure: THROMBECTOMY Left arm ARTERIOVENOUS FISTULA  and Left arm Arteriovenous gortex graft;  Surgeon: [name], MD;  Location: [place];  Service: Vascular;  Laterality: Left;  Marland Kitchen VENOGRAM Left 01/27/2019   Procedure: Left arm FISTULOGRAM/;  Surgeon: [name], MD;  Location: [facility] OR;  Service: Vascular;  Laterality: Left;    Social History   Socioeconomic History  . Marital status: Divorced    Spouse name: Not on file  . Number of children: 4  . Years of education: Not on file  .  Highest education level: 8th grade  Occupational History  . Not on file  Tobacco Use  . Smoking status: Former Smoker    Years: 30.00    Quit date: 1994    Years since quitting: 26.9  . Smokeless tobacco: Never Used  Substance and Sexual Activity  . Alcohol use: No  . Drug use: No  . Sexual activity: Not Currently  Other Topics Concern  . Not on file  Social History Narrative   4 kids (3 daughters and 1 son)   Daughter [name] DPR   Social Determinants of Health   Financial  Resource Strain: Low Risk   . Difficulty of Paying Living Expenses: Not hard at all  Food Insecurity: No Food Insecurity  . Worried About Charity fundraiser in the Last Year: Never true  . Ran Out of Food in the Last Year: Never true  Transportation Needs: No Transportation Needs  . Lack of Transportation (Medical): No  . Lack of Transportation (Non-Medical): No  Physical Activity:   . Days of Exercise per Week: Not on file  . Minutes of Exercise per Session: Not on file  Stress: No Stress Concern Present  . Feeling of Stress : Only a little  Social Connections: Unknown  . Frequency of Communication with Friends and Family: More than three times a week  . Frequency of Social Gatherings with Friends and Family: Not on file  . Attends Religious Services: Not on file  . Active Member of Clubs or Organizations: Not on file  . Attends Archivist Meetings: Not on file  . Marital Status: Divorced  Human resources officer Violence: Not At Risk  . Fear of Current or Ex-Partner: No  . Emotionally Abused: No  . Physically Abused: No  . Sexually Abused: No    Family History  Problem Relation Age of Onset  . Diabetes Mother   . Kidney disease Mother   . Liver disease Mother   . Heart Problems Mother     Allergies  Allergen Reactions  . Ace Inhibitors Other (See Comments)    Hyperkalemia on ACE INHIBITORS Cr>1.9  . Angiotensin Receptor Blockers Other (See Comments)    Hyperkalemia Elevates Cr > 1.9   . Ivp Dye [Iodinated Diagnostic Agents] Other (See Comments)    Cannot have due to renal failure  . Adhesive [Tape] Other (See Comments)    UNDESCRIBED REACTION Can tolerate ONLY Coban wrap or mild paper tape!!     Review of Systems   Review of Systems: Negative Unless Checked Constitutional: [] Weight loss  [] Fever  [] Chills Cardiac: [] Chest pain   []  Atrial Fibrillation  [] Palpitations   [] Shortness of breath when laying flat   [] Shortness of breath with exertion.  [] Shortness of breath at rest Vascular:  [] Pain in legs with walking   [] Pain in legs with standing [] Pain in legs when laying flat   [] Claudication    [] Pain in feet when laying flat    [] History of DVT   [] Phlebitis   [] Swelling in legs   [] Varicose veins   [] Non-healing ulcers Pulmonary:   [] Uses home oxygen   [] Productive cough   [] Hemoptysis   [] Wheeze  [] COPD   [] Asthma Neurologic:  [] Dizziness   [] Seizures  [] Blackouts [] History of stroke   [] History of TIA  [] Aphasia   [] Temporary Blindness   [] Weakness or numbness in arm   [] Weakness or numbness in leg Musculoskeletal:   [] Joint swelling   [] Joint pain   [] Low back pain  []   History of Knee Replacement [x] Arthritis [] back Surgeries  []  Spinal Stenosis    Hematologic:  [] Easy bruising  [] Easy bleeding   [] Hypercoagulable state   [] Anemic Gastrointestinal:  [] Diarrhea   [] Vomiting  [] Gastroesophageal reflux/heartburn   [] Difficulty swallowing. [] Abdominal pain Genitourinary:  [x] Chronic kidney disease   [] Difficult urination  [] Anuric   [] Blood in urine [] Frequent urination  [] Burning with urination   [] Hematuria Skin:  [] Rashes   [] Ulcers [] Wounds Psychological:  [] History of anxiety   []  History of major depression  [x]  Memory Difficulties      OBJECTIVE:   Physical Exam  BP 127/66 (BP Location: Right Arm)   Pulse 98   Resp 16   Wt 208 lb 9.6 oz (94.6 kg)   BMI 31.72 kg/m   Gen: WD/WN, NAD Head: [place]/AT, No temporalis wasting.  Ear/Nose/Throat: Hearing grossly intact, nares w/o erythema or drainage Eyes: PER, EOMI, sclera nonicteric.  Neck: Supple, no masses.  No JVD.  Pulmonary:  Good air movement, no use of accessory muscles.  Cardiac: RRR Vascular: good thrill and bruit  Vessel Right Left  Radial Palpable Palpable   Gastrointestinal: soft, non-distended. No guarding/no peritoneal signs.  Musculoskeletal: M/S 5/5 throughout.  No deformity or atrophy.  Neurologic: Pain and light touch intact in extremities.  Symmetrical.   Speech is fluent. Motor exam as listed above. Psychiatric: Judgment intact, Mood & affect appropriate for pt's clinical situation. Dermatologic: No Venous rashes. No Ulcers Noted.  No changes consistent with cellulitis. Lymph : No Cervical lymphadenopathy, no lichenification or skin changes of chronic lymphedema.       ASSESSMENT AND PLAN:  1. ESRD (end stage renal disease) on dialysis [facility]) Recommend:  The patient is doing well and currently has adequate dialysis access. The patient's dialysis center is not reporting any access issues. Flow pattern is stable when compared to the prior ultrasound.  The patient should have a duplex ultrasound of the dialysis access in 6 months. The patient will follow-up with me in the office after each ultrasound     2. Essential hypertension Continue antihypertensive medications as already ordered, these medications have been reviewed and there are no changes at this time.   3. Type 2 diabetes mellitus without complication, without long-term current use of insulin (HCC) Continue hypoglycemic medications as already ordered, these medications have been reviewed and there are no changes at this time.  Hgb A1C to be monitored as already arranged by primary service    Current Outpatient Medications on File Prior to Visit  Medication Sig Dispense Refill  . allopurinol (ZYLOPRIM) 100 MG tablet Take 1 tablet (100 mg total) by mouth daily. 90 tablet 3  . aspirin EC 81 MG tablet Take 81 mg daily by mouth.    Marland Kitchen atorvastatin (LIPITOR) 20 MG tablet Take 20 mg by mouth daily at 6 PM.     . docusate sodium (COLACE) 100 MG capsule Take 200 mg by mouth daily as needed.     . gabapentin (NEURONTIN) 300 MG capsule Take 1 capsule (300 mg total) by mouth daily. At 3 PM 90 capsule 3  . ketoconazole (NIZORAL) 2 % shampoo Apply 1 application topically 2 (two) times a week. (Patient taking differently: Apply 1 application topically as needed. ) 120 mL 11  .  levothyroxine (SYNTHROID) 175 MCG tablet Take 1 tablet (175 mcg total) by mouth daily before breakfast. 30 minutes before breakfast 90 tablet 3  . lidocaine-prilocaine (EMLA) cream APPLY SMALL AMOUNT TO ACCESS SITE (AVF) 1 TO 2 HOURS  BEFORE DIALYSIS. COVER WITH OCCLUSIVE DRESSING (SARAN WRAP)    . midodrine (PROAMATINE) 2.5 MG tablet Take 1 tablet (2.5 mg total) by mouth 30 (thirty) minutes before procedure for 1 dose. Dialysis (Patient taking differently: Take 2.5 mg by mouth 30 (thirty) minutes before procedure. Dialysis) 30 tablet 1  . multivitamin (RENA-VIT) TABS tablet Take 1 tablet daily by mouth.    . ondansetron (ZOFRAN) 4 MG tablet Take 4 mg by mouth every 8 (eight) hours as needed for nausea or vomiting.     . sevelamer carbonate (RENVELA) 2.4 g PACK Take by mouth daily. And with any snacks     . tamsulosin (FLOMAX) 0.4 MG CAPS capsule Take 0.4 mg by mouth daily after supper.     . Zinc Oxide (BALMEX EX) Apply 1 application topically daily as needed (to irritated areas of skin).      No current facility-administered medications on file prior to visit.    There are no Patient Instructions on file for this visit. No follow-ups on file.   Kris Hartmann, NP  This note was completed with Sales executive.  Any errors are purely unintentional.

## 2019-09-07 ENCOUNTER — Encounter: Payer: Self-pay | Admitting: *Deleted

## 2019-09-11 DIAGNOSIS — I639 Cerebral infarction, unspecified: Secondary | ICD-10-CM

## 2019-09-11 HISTORY — DX: Cerebral infarction, unspecified: I63.9

## 2019-09-15 NOTE — Progress Notes (Signed)
Cardiology Office Note Date:  09/16/2019  Patient ID:  Frank [name], Frank [name] Dec 19, 1940, MRN [id number] PCP:  McLean-Scocuzza, Nino Glow, MD  Electrophysiologist:  Dr. Rayann Heman Nephrologist: Dr. Posey Pronto   This visit type was conducted due to national recommendations for restrictions regarding the COVID-19 Pandemic (e.g. social distancing) in an effort to limit this patient's exposure and mitigate transmission in our community.  Due to her co-morbid illnesses, this patient is at least at moderate risk for complications without adequate follow up.  This format is felt to be most appropriate for this patient at this time.  The patient did not have access to video technology/had technical difficulties with video requiring transitioning to audio format only (telephone).  All issues noted in this document were discussed and addressed.  No physical exam could be performed with this format.  Please refer to the patient's chart for her  consent to telehealth for [facility].   Patient Location: Home Provider Location: Office   Chief Complaint: planned follow up  History of Present Illness: Frank [name] is a 79 y.o. 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 Rayann Heman.  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  More recently saw A. [place], [place] Nov 2020, for hypotension with HD and some non-HD days as well, and reprts of bradycardia I the 40's50's.  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  Discussed midodrine may exacerbate bradycardia.  The patient and his daughter (an Therapist, sports), wanted to try the midodrine with plans to have follow up for  titration if needed, and/or discuss PPM further.    The visit is held via speaker phone with the patient who identifies himself by name and birthdate and his daughter Frank [name] (a Nature conservation officer). The visit transitioned to tele-health visit 2/2 unfortunate COVID exposure at [facility] last week, his COVID test result done is pending, he has no symptoms of COVID.   Since starting the ProAmatine, his dose was adjusted up to 5mg  (1/2 hour prior to HD on (HD days only) by Dr. Posey Pronto and has been on this dose since 07/28/2019. The patient does not think that there has been a marked improvement, that most sessions they still have to slow or stop his HD for a little bit prior to finishing his session.  When his BP gets to about 100/40's he feels very lightheaded feels terrible.  They have not reported, he has not noted any BP as low as they were before, nothing <90SBP  His daughter reports vitals given to her by HD nurse from the last few sessions mentioning they told her his HR's generally 70's-80's during the sessions, once was 54 at the start. Pre HD, 122/59, 121/57, today 121/54 Post HD 104/57, 103/45, today 100/56 Off HD days at home 121/45, 120/50, 119/45, once 95/51 (no syncope, but felt weak) In review of his HR on his home monitor, none < 60, most 70's   He denies any CP, palpitations, no SOB.   Past Medical History:  Diagnosis Date  . Anemia of chronic kidney failure   . Arthritis   . Bradycardia   . Cataracts,  bilateral 05/15/2012  . Chronic idiopathic gout of multiple sites 02/10/2015  . Chronic kidney disease    [facility]  . Depression   . Diabetes mellitus without complication (HCC)    diet controlled   . Diabetic nephropathy ([place])   . ED (erectile dysfunction) 08/28/2012  . Elevated liver enzymes 06/10/2019  . Epiretinal membrane (ERM) of right eye 06/10/2019   Right eye saw AE 06/05/2019    . Essential hypertension 05/07/2013   Last Assessment & Plan:  Formatting of this  note might be different from the original. HTN PLAN   BP goal is <140/90 BP Readings from Last 3 Encounters:  08/27/16 140/79  08/13/16 141/80  08/06/16 144/86   Current Anti-Hyptensive Medications: None  Today's Recommendations: Continue lifestyle modifications. Defer starting medication to PCP.  Recommend continued regular exercise as tolerated. Recomm  . History of UTI    s/p foley in hospital 01/2019   . Hyperlipidemia   . Hypothyroidism   . Illiterate 11/19/2013   Last Assessment & Plan:  Assisted patient with Handicapped Placard application today.    . Insomnia 05/08/2019  . Neuropathy   . Peripheral neuropathy   . Peripheral vascular disease ([place])   . Secondary hyperparathyroidism ([place])    Renal  . Thyroid disease     Past Surgical History:  Procedure Laterality Date  . AV FISTULA PLACEMENT Left 11/19/2017   Procedure: ARTERIOVENOUS (AV) FISTULA CREATION;  Surgeon: [name], MD;  Location: [facility];  Service: Vascular;  Laterality: Left;  . BASCILIC VEIN TRANSPOSITION Left 06/04/2018   Procedure: BASILIC VEIN TRANSPOSITION ARM;  Surgeon: [name], MD;  Location: [facility];  Service: Vascular;  Laterality: Left;  . COLONOSCOPY    . DIALYSIS/PERMA CATHETER REMOVAL N/A 03/23/2019   Procedure: DIALYSIS/PERMA CATHETER REMOVAL;  Surgeon: [name], MD;  Location: [facility];  Service: Cardiovascular;  Laterality: N/A;  . EYE SURGERY     cataract surgery b/l; photophobia since   . INSERTION OF DIALYSIS CATHETER N/A 01/23/2019   Procedure: INSERTION OF DIALYSIS CATHETER;  Surgeon: [name], MD;  Location[facility];  Service: Vascular;  Laterality: N/A;  INSERTION OF DIALYSIS CATHETER  . KNEE ARTHROSCOPY Left    Knee  . PERIPHERAL VASCULAR THROMBECTOMY Left 07/06/2019   Procedure: PERIPHERAL VASCULAR THROMBECTOMY;  Surgeon: [name], MD;  Location: [facility];  Service: Cardiovascular;  Laterality: Left;  . THROMBECTOMY W/  EMBOLECTOMY Left 01/27/2019   Procedure: THROMBECTOMY Left arm ARTERIOVENOUS FISTULA  and Left arm Arteriovenous gortex graft;  Surgeon: [name], MD;  Location: [facility];  Service: Vascular;  Laterality: Left;  Marland Kitchen VENOGRAM Left 01/27/2019   Procedure: Left arm FISTULOGRAM/;  Surgeon: [name], MD;  Location: [facility];  Service: Vascular;  Laterality: Left;    Current Outpatient Medications  Medication Sig Dispense Refill  . levothyroxine (SYNTHROID) 150 MCG tablet Take 150 mcg by mouth daily before breakfast.    . midodrine (PROAMATINE) 5 MG tablet Take 5 mg by mouth 3 (three) times a week. Tues Thurs and Sat 3 minutes before dialysis    . allopurinol (ZYLOPRIM) 100 MG tablet Take 1 tablet (100 mg total) by mouth daily. 90 tablet 3  . aspirin EC 81 MG tablet Take 81 mg daily by mouth.    Marland Kitchen atorvastatin (LIPITOR) 20 MG tablet Take 20 mg by mouth daily at 6 PM.     . docusate sodium (COLACE) 100 MG capsule Take  200 mg by mouth daily as needed.     . gabapentin (NEURONTIN) 300 MG capsule Take 1 capsule (300 mg total) by mouth daily. At 3 PM 90 capsule 3  . ketoconazole (NIZORAL) 2 % shampoo Apply 1 application topically 2 (two) times a week. (Patient taking differently: Apply 1 application topically as needed. ) 120 mL 11  . lidocaine-prilocaine (EMLA) cream APPLY SMALL AMOUNT TO ACCESS SITE (AVF) 1 TO 2 HOURS BEFORE DIALYSIS. COVER WITH OCCLUSIVE DRESSING (SARAN WRAP)    . multivitamin (RENA-VIT) TABS tablet Take 1 tablet daily by mouth.    . ondansetron (ZOFRAN) 4 MG tablet Take 4 mg by mouth every 8 (eight) hours as needed for nausea or vomiting.     . sevelamer carbonate (RENVELA) 2.4 g PACK Take by mouth 3 (three) times daily with meals. And with any snacks     . tamsulosin (FLOMAX) 0.4 MG CAPS capsule Take 0.4 mg by mouth daily after supper.     . Zinc Oxide (BALMEX EX) Apply 1 application topically daily as needed (to irritated areas of skin).      No current  facility-administered medications for this visit.    Allergies:   Ace inhibitors, Angiotensin receptor blockers, Ivp dye [iodinated diagnostic agents], and Adhesive [tape]   Social History:  The patient  reports that he quit smoking about 27 years ago. He quit after 30.00 years of use. He has never used smokeless tobacco. He reports that he does not drink alcohol or use drugs.   Family History:  The patient's family history includes Diabetes in his mother; Heart Problems in his mother; Kidney disease in his mother; Liver disease in his mother.  ROS:  Please see the history of present illness.    All other systems are reviewed and otherwise negative.   PHYSICAL EXAM:  VS:  BP (!) 110/50   Pulse 79   Ht 5\' 7"  (1.702 m)   Wt 208 lb (94.3 kg)   BMI 32.58 kg/m  BMI: Body mass index is 32.58 kg/m. The patient does not sound SOB, speaks at a normal pace in full sentences.  EKG:  Done 07/24/2019 is reviewed SR 77bpm, marked 1st degree AVblock, 382ms, narrow QRS   01/22/2019: TTE IMPRESSIONS 1. The left ventricle has normal systolic function with an ejection fraction of 60-65%. The cavity size was normal. There is moderate concentric left ventricular hypertrophy. Left ventricular diastolic Doppler parameters are consistent with impaired  relaxation.  2. The right ventricle has normal systolic function. The cavity was normal. There is no increase in right ventricular wall thickness.  3. Left atrial size was mildly dilated.  4. The mitral valve is degenerative. Mild thickening of the mitral valve leaflet. There is mild mitral annular calcification present.  5. The aortic valve is tricuspid. Mild thickening of the aortic valve. Mild calcification of the aortic valve. Aortic valve regurgitation was not assessed by color flow Doppler.    Recent Labs: 05/11/2019: Hemoglobin 12.5; Magnesium 2.3; Platelets 169; TSH 4.980 06/10/2019: ALT 67; BUN 56; Creatinine, Ser 5.70; Potassium 4.0; Sodium 132    05/11/2019: Chol/HDL Ratio 2.4; Cholesterol, Total 149; HDL 62; LDL Chol Calc (NIH) 72; Triglycerides 79   CrCl cannot be calculated (Patient's most recent lab result is older than the maximum 21 days allowed.).   Wt Readings from Last 3 Encounters:  09/16/19 208 lb (94.3 kg)  08/26/19 208 lb 9.6 oz (94.6 kg)  07/24/19 203 lb (92.1 kg)     Other  studies reviewed: Additional studies/records reviewed today include: summarized above  ASSESSMENT AND PLAN:  1. 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 Hypotension since on HD     He continues to report that they need to slow/stop his HD to allow his BP to recover to finish, though the numbers reported do seem to have had some improvement.      I do not expect any hypertension at HD and will increase his ProAmatine to 10mg  PO 1/2 prior to dialysis sessions      He does not use otherwise.  He has felt pretty fatigued since the start of HD back in May 2020, no syncope.  We discussed COVID precautions.  Time:   Today, I have spent 25 minutes with the patient with telehealth technology discussing the above problems.         Disposition: We can plan another tele-health visit in 6-8 weeks.  I will reach out to he dialysis center to see if they can send a Vitals record to [place] and get a better idea of how his BP is doing during his session  (called today, unable to speak to anyone, will follow up)  Current medicines are reviewed at length with the patient today.  The patient did not have any concerns regarding medicines.     Venetia Night, PA-C 09/16/2019 5:45 PM     [address]  [place] [address] [phone] (office)  [phone] (fax)

## 2019-09-16 ENCOUNTER — Telehealth (INDEPENDENT_AMBULATORY_CARE_PROVIDER_SITE_OTHER): Payer: Medicare Other | Admitting: Physician Assistant

## 2019-09-16 ENCOUNTER — Other Ambulatory Visit: Payer: Self-pay

## 2019-09-16 VITALS — BP 110/50 | HR 79 | Ht 67.0 in | Wt 208.0 lb

## 2019-09-16 DIAGNOSIS — I441 Atrioventricular block, second degree: Secondary | ICD-10-CM

## 2019-09-16 DIAGNOSIS — I440 Atrioventricular block, first degree: Secondary | ICD-10-CM | POA: Diagnosis not present

## 2019-09-16 DIAGNOSIS — I953 Hypotension of hemodialysis: Secondary | ICD-10-CM | POA: Diagnosis not present

## 2019-09-16 MED ORDER — MIDODRINE HCL 10 MG PO TABS: 10.0000 mg | ORAL_TABLET | ORAL | 0 refills | Status: DC

## 2019-09-16 NOTE — Patient Instructions (Signed)
Medication Instructions:   START TAKING MIODIDRINE  TO 10 MG 1/2 HOUR PRIOR TO DIALYSIS   *If you need a refill on your cardiac medications before your next appointment, please call your pharmacy*  Lab Work: NONE ORDERED  TODAY  If you have labs (blood work) drawn today and your tests are completely normal, you will receive your results only by: Marland Kitchen MyChart Message (if you have MyChart) OR . A paper copy in the mail If you have any lab test that is abnormal or we need to change your treatment, we will call you to review the results.  Testing/Procedures: NONE ORDERED  TODAY   Follow-Up: At [facility], you and your health needs are our priority.  As part of our continuing mission to provide you with exceptional heart care, we have created designated Provider Care Teams.  These Care Teams include your primary Cardiologist (physician) and Advanced Practice Providers (APPs -  Physician Assistants and Nurse Practitioners) who all work together to provide you with the care you need, when you need it.  Your next appointment:   6-8 week(s)  The format for your next appointment:   Virtual Visit   Provider:  You may see Tommye Standard, PA-C   Legrand Como "[place]da [place], [place]  Other Instructions

## 2019-10-07 ENCOUNTER — Inpatient Hospital Stay (HOSPITAL_COMMUNITY)
Admission: EM | Admit: 2019-10-07 | Discharge: 2019-10-24 | DRG: 034 | Disposition: A | Discharge status: Skilled Nursing Facility | Payer: Medicare Other | Attending: Internal Medicine | Admitting: Internal Medicine

## 2019-10-07 ENCOUNTER — Inpatient Hospital Stay (HOSPITAL_COMMUNITY): Payer: Medicare Other

## 2019-10-07 ENCOUNTER — Other Ambulatory Visit: Payer: Self-pay

## 2019-10-07 ENCOUNTER — Emergency Department (HOSPITAL_COMMUNITY): Payer: Medicare Other

## 2019-10-07 DIAGNOSIS — I63131 Cerebral infarction due to embolism of right carotid artery: Principal | ICD-10-CM

## 2019-10-07 DIAGNOSIS — I959 Hypotension, unspecified: Secondary | ICD-10-CM

## 2019-10-07 DIAGNOSIS — R531 Weakness: Secondary | ICD-10-CM | POA: Diagnosis not present

## 2019-10-07 DIAGNOSIS — Z888 Allergy status to other drugs, medicaments and biological substances status: Secondary | ICD-10-CM

## 2019-10-07 DIAGNOSIS — I440 Atrioventricular block, first degree: Secondary | ICD-10-CM | POA: Diagnosis present

## 2019-10-07 DIAGNOSIS — T82898A Other specified complication of vascular prosthetic devices, implants and grafts, initial encounter: Secondary | ICD-10-CM

## 2019-10-07 DIAGNOSIS — Z992 Dependence on renal dialysis: Secondary | ICD-10-CM

## 2019-10-07 DIAGNOSIS — I2510 Atherosclerotic heart disease of native coronary artery without angina pectoris: Secondary | ICD-10-CM | POA: Diagnosis present

## 2019-10-07 DIAGNOSIS — T82868A Thrombosis of vascular prosthetic devices, implants and grafts, initial encounter: Secondary | ICD-10-CM | POA: Diagnosis present

## 2019-10-07 DIAGNOSIS — R579 Shock, unspecified: Secondary | ICD-10-CM | POA: Diagnosis not present

## 2019-10-07 DIAGNOSIS — I6389 Other cerebral infarction: Secondary | ICD-10-CM

## 2019-10-07 DIAGNOSIS — Z91041 Radiographic dye allergy status: Secondary | ICD-10-CM

## 2019-10-07 DIAGNOSIS — Y712 Prosthetic and other implants, materials and accessory cardiovascular devices associated with adverse incidents: Secondary | ICD-10-CM | POA: Diagnosis present

## 2019-10-07 DIAGNOSIS — Z8249 Family history of ischemic heart disease and other diseases of the circulatory system: Secondary | ICD-10-CM

## 2019-10-07 DIAGNOSIS — Z751 Person awaiting admission to adequate facility elsewhere: Secondary | ICD-10-CM

## 2019-10-07 DIAGNOSIS — R4781 Slurred speech: Secondary | ICD-10-CM

## 2019-10-07 DIAGNOSIS — Z841 Family history of disorders of kidney and ureter: Secondary | ICD-10-CM

## 2019-10-07 DIAGNOSIS — R001 Bradycardia, unspecified: Secondary | ICD-10-CM | POA: Diagnosis not present

## 2019-10-07 DIAGNOSIS — D631 Anemia in chronic kidney disease: Secondary | ICD-10-CM | POA: Diagnosis present

## 2019-10-07 DIAGNOSIS — I499 Cardiac arrhythmia, unspecified: Secondary | ICD-10-CM | POA: Diagnosis not present

## 2019-10-07 DIAGNOSIS — Y832 Surgical operation with anastomosis, bypass or graft as the cause of abnormal reaction of the patient, or of later complication, without mention of misadventure at the time of the procedure: Secondary | ICD-10-CM | POA: Diagnosis present

## 2019-10-07 DIAGNOSIS — I472 Ventricular tachycardia: Secondary | ICD-10-CM | POA: Diagnosis not present

## 2019-10-07 DIAGNOSIS — Z833 Family history of diabetes mellitus: Secondary | ICD-10-CM

## 2019-10-07 DIAGNOSIS — G8929 Other chronic pain: Secondary | ICD-10-CM | POA: Diagnosis present

## 2019-10-07 DIAGNOSIS — N342 Other urethritis: Secondary | ICD-10-CM | POA: Diagnosis not present

## 2019-10-07 DIAGNOSIS — G459 Transient cerebral ischemic attack, unspecified: Secondary | ICD-10-CM

## 2019-10-07 DIAGNOSIS — G9341 Metabolic encephalopathy: Secondary | ICD-10-CM | POA: Diagnosis not present

## 2019-10-07 DIAGNOSIS — F05 Delirium due to known physiological condition: Secondary | ICD-10-CM | POA: Diagnosis not present

## 2019-10-07 DIAGNOSIS — R0602 Shortness of breath: Secondary | ICD-10-CM

## 2019-10-07 DIAGNOSIS — E1122 Type 2 diabetes mellitus with diabetic chronic kidney disease: Secondary | ICD-10-CM | POA: Diagnosis present

## 2019-10-07 DIAGNOSIS — R29810 Facial weakness: Secondary | ICD-10-CM | POA: Diagnosis present

## 2019-10-07 DIAGNOSIS — M545 Low back pain: Secondary | ICD-10-CM | POA: Diagnosis present

## 2019-10-07 DIAGNOSIS — G8194 Hemiplegia, unspecified affecting left nondominant side: Secondary | ICD-10-CM | POA: Diagnosis present

## 2019-10-07 DIAGNOSIS — E876 Hypokalemia: Secondary | ICD-10-CM | POA: Diagnosis not present

## 2019-10-07 DIAGNOSIS — E785 Hyperlipidemia, unspecified: Secondary | ICD-10-CM | POA: Diagnosis present

## 2019-10-07 DIAGNOSIS — Z9181 History of falling: Secondary | ICD-10-CM

## 2019-10-07 DIAGNOSIS — Z87891 Personal history of nicotine dependence: Secondary | ICD-10-CM

## 2019-10-07 DIAGNOSIS — M1A09X0 Idiopathic chronic gout, multiple sites, without tophus (tophi): Secondary | ICD-10-CM | POA: Diagnosis present

## 2019-10-07 DIAGNOSIS — I639 Cerebral infarction, unspecified: Secondary | ICD-10-CM

## 2019-10-07 DIAGNOSIS — I10 Essential (primary) hypertension: Secondary | ICD-10-CM | POA: Diagnosis present

## 2019-10-07 DIAGNOSIS — Z9841 Cataract extraction status, right eye: Secondary | ICD-10-CM

## 2019-10-07 DIAGNOSIS — Z6832 Body mass index (BMI) 32.0-32.9, adult: Secondary | ICD-10-CM

## 2019-10-07 DIAGNOSIS — E039 Hypothyroidism, unspecified: Secondary | ICD-10-CM | POA: Diagnosis present

## 2019-10-07 DIAGNOSIS — E1142 Type 2 diabetes mellitus with diabetic polyneuropathy: Secondary | ICD-10-CM | POA: Diagnosis present

## 2019-10-07 DIAGNOSIS — D72829 Elevated white blood cell count, unspecified: Secondary | ICD-10-CM

## 2019-10-07 DIAGNOSIS — E1151 Type 2 diabetes mellitus with diabetic peripheral angiopathy without gangrene: Secondary | ICD-10-CM | POA: Diagnosis present

## 2019-10-07 DIAGNOSIS — N186 End stage renal disease: Secondary | ICD-10-CM

## 2019-10-07 DIAGNOSIS — I6340 Cerebral infarction due to embolism of unspecified cerebral artery: Secondary | ICD-10-CM | POA: Insufficient documentation

## 2019-10-07 DIAGNOSIS — K5900 Constipation, unspecified: Secondary | ICD-10-CM | POA: Diagnosis not present

## 2019-10-07 DIAGNOSIS — R471 Dysarthria and anarthria: Secondary | ICD-10-CM | POA: Diagnosis present

## 2019-10-07 DIAGNOSIS — Z79899 Other long term (current) drug therapy: Secondary | ICD-10-CM

## 2019-10-07 DIAGNOSIS — G4700 Insomnia, unspecified: Secondary | ICD-10-CM | POA: Diagnosis present

## 2019-10-07 DIAGNOSIS — I441 Atrioventricular block, second degree: Secondary | ICD-10-CM | POA: Diagnosis present

## 2019-10-07 DIAGNOSIS — I9589 Other hypotension: Secondary | ICD-10-CM | POA: Diagnosis present

## 2019-10-07 DIAGNOSIS — I950 Idiopathic hypotension: Secondary | ICD-10-CM | POA: Diagnosis not present

## 2019-10-07 DIAGNOSIS — J189 Pneumonia, unspecified organism: Secondary | ICD-10-CM | POA: Diagnosis not present

## 2019-10-07 DIAGNOSIS — Z7982 Long term (current) use of aspirin: Secondary | ICD-10-CM

## 2019-10-07 DIAGNOSIS — I498 Other specified cardiac arrhythmias: Secondary | ICD-10-CM | POA: Diagnosis not present

## 2019-10-07 DIAGNOSIS — I739 Peripheral vascular disease, unspecified: Secondary | ICD-10-CM | POA: Diagnosis not present

## 2019-10-07 DIAGNOSIS — N2581 Secondary hyperparathyroidism of renal origin: Secondary | ICD-10-CM | POA: Diagnosis present

## 2019-10-07 DIAGNOSIS — R1310 Dysphagia, unspecified: Secondary | ICD-10-CM | POA: Diagnosis present

## 2019-10-07 DIAGNOSIS — E669 Obesity, unspecified: Secondary | ICD-10-CM | POA: Diagnosis present

## 2019-10-07 DIAGNOSIS — D7589 Other specified diseases of blood and blood-forming organs: Secondary | ICD-10-CM | POA: Diagnosis not present

## 2019-10-07 DIAGNOSIS — I6521 Occlusion and stenosis of right carotid artery: Secondary | ICD-10-CM

## 2019-10-07 DIAGNOSIS — M19071 Primary osteoarthritis, right ankle and foot: Secondary | ICD-10-CM | POA: Diagnosis present

## 2019-10-07 DIAGNOSIS — Z8379 Family history of other diseases of the digestive system: Secondary | ICD-10-CM

## 2019-10-07 DIAGNOSIS — Z91048 Other nonmedicinal substance allergy status: Secondary | ICD-10-CM

## 2019-10-07 DIAGNOSIS — R29704 NIHSS score 4: Secondary | ICD-10-CM | POA: Diagnosis present

## 2019-10-07 DIAGNOSIS — Z87440 Personal history of urinary (tract) infections: Secondary | ICD-10-CM

## 2019-10-07 DIAGNOSIS — Z79890 Hormone replacement therapy (postmenopausal): Secondary | ICD-10-CM

## 2019-10-07 DIAGNOSIS — I444 Left anterior fascicular block: Secondary | ICD-10-CM | POA: Diagnosis present

## 2019-10-07 DIAGNOSIS — R338 Other retention of urine: Secondary | ICD-10-CM | POA: Diagnosis present

## 2019-10-07 DIAGNOSIS — Z20822 Contact with and (suspected) exposure to covid-19: Secondary | ICD-10-CM | POA: Diagnosis present

## 2019-10-07 DIAGNOSIS — N401 Enlarged prostate with lower urinary tract symptoms: Secondary | ICD-10-CM | POA: Diagnosis present

## 2019-10-07 DIAGNOSIS — Z9842 Cataract extraction status, left eye: Secondary | ICD-10-CM

## 2019-10-07 LAB — DIFFERENTIAL
Abs Immature Granulocytes: 0.05 10*3/uL (ref 0.00–0.07)
Basophils Absolute: 0 10*3/uL (ref 0.0–0.1)
Basophils Relative: 0 %
Eosinophils Absolute: 0 10*3/uL (ref 0.0–0.5)
Eosinophils Relative: 0 %
Immature Granulocytes: 1 %
Lymphocytes Relative: 12 %
Lymphs Abs: 1.3 10*3/uL (ref 0.7–4.0)
Monocytes Absolute: 0.8 10*3/uL (ref 0.1–1.0)
Monocytes Relative: 7 %
Neutro Abs: 8.6 10*3/uL — ABNORMAL HIGH (ref 1.7–7.7)
Neutrophils Relative %: 80 %

## 2019-10-07 LAB — COMPREHENSIVE METABOLIC PANEL
ALT: 24 U/L (ref 0–44)
AST: 40 U/L (ref 15–41)
Albumin: 3.5 g/dL (ref 3.5–5.0)
Alkaline Phosphatase: 99 U/L (ref 38–126)
Anion gap: 18 — ABNORMAL HIGH (ref 5–15)
BUN: 54 mg/dL — ABNORMAL HIGH (ref 8–23)
CO2: 27 mmol/L (ref 22–32)
Calcium: 9.5 mg/dL (ref 8.9–10.3)
Chloride: 92 mmol/L — ABNORMAL LOW (ref 98–111)
Creatinine, Ser: 7.02 mg/dL — ABNORMAL HIGH (ref 0.61–1.24)
GFR calc Af Amer: 8 mL/min — ABNORMAL LOW (ref >60)
GFR calc non Af Amer: 7 mL/min — ABNORMAL LOW (ref >60)
Glucose, Bld: 163 mg/dL — ABNORMAL HIGH (ref 70–99)
Potassium: 4.6 mmol/L (ref 3.5–5.1)
Sodium: 137 mmol/L (ref 135–145)
Total Bilirubin: 0.4 mg/dL (ref 0.3–1.2)
Total Protein: 7.3 g/dL (ref 6.5–8.1)

## 2019-10-07 LAB — AMMONIA: Ammonia: 20 umol/L (ref 9–35)

## 2019-10-07 LAB — CBC
HCT: 42.5 % (ref 39.0–52.0)
Hemoglobin: 13.4 g/dL (ref 13.0–17.0)
MCH: 32.4 pg (ref 26.0–34.0)
MCHC: 31.5 g/dL (ref 30.0–36.0)
MCV: 102.7 fL — ABNORMAL HIGH (ref 80.0–100.0)
Platelets: 209 10*3/uL (ref 150–400)
RBC: 4.14 MIL/uL — ABNORMAL LOW (ref 4.22–5.81)
RDW: 13 % (ref 11.5–15.5)
WBC: 10.8 10*3/uL — ABNORMAL HIGH (ref 4.0–10.5)
nRBC: 0 % (ref 0.0–0.2)

## 2019-10-07 LAB — I-STAT CHEM 8, ED
BUN: 49 mg/dL — ABNORMAL HIGH (ref 8–23)
Calcium, Ion: 1.02 mmol/L — ABNORMAL LOW (ref 1.15–1.40)
Chloride: 94 mmol/L — ABNORMAL LOW (ref 98–111)
Creatinine, Ser: 7.1 mg/dL — ABNORMAL HIGH (ref 0.61–1.24)
Glucose, Bld: 158 mg/dL — ABNORMAL HIGH (ref 70–99)
HCT: 41 % (ref 39.0–52.0)
Hemoglobin: 13.9 g/dL (ref 13.0–17.0)
Potassium: 4.4 mmol/L (ref 3.5–5.1)
Sodium: 134 mmol/L — ABNORMAL LOW (ref 135–145)
TCO2: 31 mmol/L (ref 22–32)

## 2019-10-07 LAB — URINALYSIS, ROUTINE W REFLEX MICROSCOPIC
Bilirubin Urine: NEGATIVE
Glucose, UA: 50 mg/dL — AB
Ketones, ur: NEGATIVE mg/dL
Nitrite: NEGATIVE
Protein, ur: 100 mg/dL — AB
Specific Gravity, Urine: 1.015 (ref 1.005–1.030)
pH: 5 (ref 5.0–8.0)

## 2019-10-07 LAB — RAPID URINE DRUG SCREEN, HOSP PERFORMED
Amphetamines: NOT DETECTED
Barbiturates: NOT DETECTED
Benzodiazepines: NOT DETECTED
Cocaine: NOT DETECTED
Opiates: NOT DETECTED
Tetrahydrocannabinol: NOT DETECTED

## 2019-10-07 LAB — LACTIC ACID, PLASMA
Lactic Acid, Venous: 1.6 mmol/L (ref 0.5–1.9)
Lactic Acid, Venous: 1.5 mmol/L (ref 0.5–1.9)

## 2019-10-07 LAB — GLUCOSE, CAPILLARY
Glucose-Capillary: 92 mg/dL (ref 70–99)
Glucose-Capillary: 150 mg/dL — ABNORMAL HIGH (ref 70–99)
Glucose-Capillary: 137 mg/dL — ABNORMAL HIGH (ref 70–99)

## 2019-10-07 LAB — ECHOCARDIOGRAM COMPLETE
Height: 67 in
Weight: 3269.86 oz

## 2019-10-07 LAB — MRSA PCR SCREENING: MRSA by PCR: NEGATIVE

## 2019-10-07 LAB — RESPIRATORY PANEL BY RT PCR (FLU A&B, COVID)
Influenza A by PCR: NEGATIVE
Influenza B by PCR: NEGATIVE
SARS Coronavirus 2 by RT PCR: NEGATIVE

## 2019-10-07 LAB — ETHANOL: Alcohol, Ethyl (B): <10 mg/dL (ref <10)

## 2019-10-07 LAB — APTT: aPTT: 30 seconds (ref 24–36)

## 2019-10-07 LAB — PROTIME-INR
INR: 1 (ref 0.8–1.2)
Prothrombin Time: 13.4 seconds (ref 11.4–15.2)

## 2019-10-07 LAB — CBG MONITORING, ED: Glucose-Capillary: 154 mg/dL — ABNORMAL HIGH (ref 70–99)

## 2019-10-07 LAB — TSH: TSH: 1.01 u[IU]/mL (ref 0.350–4.500)

## 2019-10-07 MED ORDER — IOHEXOL 350 MG/ML SOLN
100.0000 mL | Freq: Once | INTRAVENOUS | Status: AC | PRN
  Administered 2019-10-07: 100 mL via INTRAVENOUS

## 2019-10-07 MED ORDER — SODIUM CHLORIDE 0.9 % IV BOLUS
500.0000 mL | Freq: Once | INTRAVENOUS | Status: AC
  Administered 2019-10-07: 500 mL via INTRAVENOUS

## 2019-10-07 MED ORDER — CHLORHEXIDINE GLUCONATE CLOTH 2 % EX PADS
6.0000 | MEDICATED_PAD | Freq: Every day | CUTANEOUS | Status: DC
  Administered 2019-10-08 – 2019-10-09 (×2): 6 via TOPICAL

## 2019-10-07 MED ORDER — ACETAMINOPHEN 650 MG RE SUPP: 650.0000 mg | RECTAL | Status: DC | PRN

## 2019-10-07 MED ORDER — LEVOTHYROXINE SODIUM 100 MCG/5ML IV SOLN
75.0000 ug | Freq: Every day | INTRAVENOUS | Status: DC
  Filled 2019-10-07 (×4): qty 5

## 2019-10-07 MED ORDER — SODIUM CHLORIDE 0.9 % IV SOLN: INTRAVENOUS | Status: DC

## 2019-10-07 MED ORDER — SODIUM CHLORIDE 0.9 % IV SOLN
250.0000 mL | INTRAVENOUS | Status: DC
  Administered 2019-10-07 – 2019-10-14 (×2): 250 mL via INTRAVENOUS

## 2019-10-07 MED ORDER — HEPARIN SODIUM (PORCINE) 5000 UNIT/ML IJ SOLN
5000.0000 [IU] | Freq: Three times a day (TID) | INTRAMUSCULAR | Status: DC
  Administered 2019-10-07 – 2019-10-08 (×3): 5000 [IU] via SUBCUTANEOUS
  Filled 2019-10-07 (×3): qty 1

## 2019-10-07 MED ORDER — CHLORHEXIDINE GLUCONATE CLOTH 2 % EX PADS
6.0000 | MEDICATED_PAD | Freq: Every day | CUTANEOUS | Status: DC
  Administered 2019-10-07 – 2019-10-09 (×3): 6 via TOPICAL

## 2019-10-07 MED ORDER — PHENYLEPHRINE HCL-NACL 10-0.9 MG/250ML-% IV SOLN
25.0000 ug/min | INTRAVENOUS | Status: DC
  Administered 2019-10-07: 110 ug/min via INTRAVENOUS
  Administered 2019-10-07: 100 ug/min via INTRAVENOUS
  Administered 2019-10-07: 50 ug/min via INTRAVENOUS
  Administered 2019-10-07 (×2): 150 ug/min via INTRAVENOUS
  Administered 2019-10-07: 100 ug/min via INTRAVENOUS
  Administered 2019-10-07: 150 ug/min via INTRAVENOUS
  Administered 2019-10-07 (×2): 130 ug/min via INTRAVENOUS
  Administered 2019-10-08: 100 ug/min via INTRAVENOUS
  Administered 2019-10-08: 50 ug/min via INTRAVENOUS
  Administered 2019-10-08: 110 ug/min via INTRAVENOUS
  Administered 2019-10-08: 25 ug/min via INTRAVENOUS
  Administered 2019-10-08: 110 ug/min via INTRAVENOUS
  Administered 2019-10-08: 100 ug/min via INTRAVENOUS
  Administered 2019-10-08: 110 ug/min via INTRAVENOUS
  Administered 2019-10-09: 30 ug/min via INTRAVENOUS
  Administered 2019-10-09: 20 ug/min via INTRAVENOUS
  Administered 2019-10-10: 25 ug/min via INTRAVENOUS
  Filled 2019-10-07: qty 500
  Filled 2019-10-07: qty 250
  Filled 2019-10-07: qty 500
  Filled 2019-10-07: qty 1000
  Filled 2019-10-07: qty 250
  Filled 2019-10-07: qty 500
  Filled 2019-10-07: qty 250
  Filled 2019-10-07 (×2): qty 750
  Filled 2019-10-07 (×4): qty 250

## 2019-10-07 MED ORDER — ACETAMINOPHEN 160 MG/5ML PO SOLN
650.0000 mg | ORAL | Status: DC | PRN
  Filled 2019-10-07: qty 20.3

## 2019-10-07 MED ORDER — ACETAMINOPHEN 325 MG PO TABS
650.0000 mg | ORAL_TABLET | ORAL | Status: DC | PRN
  Administered 2019-10-09 – 2019-10-23 (×15): 650 mg via ORAL
  Filled 2019-10-07 (×13): qty 2

## 2019-10-07 NOTE — ED Triage Notes (Signed)
Pt from home with daughter LSN yesterday at 1500. Daughter saw pt this morning slumped over in a chair to the right side. Pt has been increasingly weak over the past week. Pt arrives to treatment room, 3 assist to get pt on to stretcher from wheelchair. EDP at bedside, CODE STROKE called by Dr Wyvonnia Dusky due to left sided facial droop, slurred speech and left leg weakness. Pt taken to CT by this RN

## 2019-10-07 NOTE — Consult Note (Signed)
Neurology Consultation  Reason for Consult: Code stroke for facial droop Referring Physician: Dr. Wyvonnia Dusky  CC: Facial droop, altered mental status  History is obtained from: Chart, patient's daughter over the phone  HPI: Frank [name] is a 79 y.o. male past medical history of end-stage renal disease on dialysis, diabetes and diabetic nephropathy, hypertension, hyperlipidemia, hypothyroidism, peripheral neuropathy, peripheral vascular disease, brought in by family for concerns of neurological change. The daughter says that he was last seen normal somewhere around 3 PM on 10/06/2019 at dialysis by the facility staff and other people in the dialysis center.  She reports this morning when family checked on him he was slumped over towards his right on his chair and had difficulty walking.  She reports that he has been having difficulty with his legs-weakness and increasing difficulty walking for the past 1 week but since they found him slumped over, they were unsure whether he is having an acute event or what else to do and decided to drive him to the hospital. He was evaluated by the ED provider-had a left facial droop and stuttering speech and a code stroke was initiated for this. I spoke with the daughter in detail.  She reports that he has been getting gradually weak over the past few months but more so in the last 1 week where he has been requiring more assistance walking with a cane, his walking has become slower although he is still able to drive himself to dialysis. She denies any sick contacts.  Denies any shortness of breath nausea vomiting. She reports when they found him this morning, their oral thermometer read 102 Fahrenheit but he did not feel warm or hot.  He has not been complaining of fevers.  On my examination, NIH stroke scale 4-detailed exam documented below.  LKW: 1500 hrs. on 10/06/2019 tpa given?: no, outside the window Premorbid modified Rankin scale (mRS): 2  ROS: ROS was  performed and is negative except as noted in the HPI.    Past Medical History:  Diagnosis Date  . Anemia of chronic kidney failure   . Arthritis   . Bradycardia   . Cataracts, bilateral 05/15/2012  . Chronic idiopathic gout of multiple sites 02/10/2015  . Chronic kidney disease    [facility]  . Depression   . Diabetes mellitus without complication (HCC)    diet controlled   . Diabetic nephropathy ([place])   . ED (erectile dysfunction) 08/28/2012  . Elevated liver enzymes 06/10/2019  . Epiretinal membrane (ERM) of right eye 06/10/2019   Right eye saw AE 06/05/2019    . Essential hypertension 05/07/2013   Last Assessment & Plan:  Formatting of this note might be different from the original. HTN PLAN   BP goal is <140/90 BP Readings from Last 3 Encounters:  08/27/16 140/79  08/13/16 141/80  08/06/16 144/86   Current Anti-Hyptensive Medications: None  Today's Recommendations: Continue lifestyle modifications. Defer starting medication to PCP.  Recommend continued regular exercise as tolerated. Recomm  . History of UTI    s/p foley in hospital 01/2019   . Hyperlipidemia   . Hypothyroidism   . Illiterate 11/19/2013   Last Assessment & Plan:  Assisted patient with Handicapped Placard application today.    . Insomnia 05/08/2019  . Neuropathy   . Peripheral neuropathy   . Peripheral vascular disease ([place])   . Secondary hyperparathyroidism ([place])    Renal  . Thyroid disease    Family History  Problem Relation Age of Onset  .  Diabetes Mother   . Kidney disease Mother   . Liver disease Mother   . Heart Problems Mother    Social History:   reports that he quit smoking about 27 years ago. He quit after 30.00 years of use. He has never used smokeless tobacco. He reports that he does not drink alcohol or use drugs.  Medications No current facility-administered medications for this encounter.  Current Outpatient Medications:  .  allopurinol (ZYLOPRIM) 100 MG tablet, Take 1 tablet (100 mg  total) by mouth daily., Disp: 90 tablet, Rfl: 3 .  aspirin EC 81 MG tablet, Take 81 mg daily by mouth., Disp: , Rfl:  .  atorvastatin (LIPITOR) 20 MG tablet, Take 20 mg by mouth daily at 6 PM. , Disp: , Rfl:  .  docusate sodium (COLACE) 100 MG capsule, Take 200 mg by mouth daily as needed. , Disp: , Rfl:  .  gabapentin (NEURONTIN) 300 MG capsule, Take 1 capsule (300 mg total) by mouth daily. At 3 PM, Disp: 90 capsule, Rfl: 3 .  ketoconazole (NIZORAL) 2 % shampoo, Apply 1 application topically 2 (two) times a week. (Patient taking differently: Apply 1 application topically as needed. ), Disp: 120 mL, Rfl: 11 .  levothyroxine (SYNTHROID) 150 MCG tablet, Take 150 mcg by mouth daily before breakfast., Disp: , Rfl:  .  lidocaine-prilocaine (EMLA) cream, APPLY SMALL AMOUNT TO ACCESS SITE (AVF) 1 TO 2 HOURS BEFORE DIALYSIS. COVER WITH OCCLUSIVE DRESSING (SARAN WRAP), Disp: , Rfl:  .  midodrine (PROAMATINE) 10 MG tablet, Take 1 tablet (10 mg total) by mouth 3 (three) times a week. Take within 1/2 hour prior to dialysis start, Disp: 45 tablet, Rfl: 0 .  multivitamin (RENA-VIT) TABS tablet, Take 1 tablet daily by mouth., Disp: , Rfl:  .  ondansetron (ZOFRAN) 4 MG tablet, Take 4 mg by mouth every 8 (eight) hours as needed for nausea or vomiting. , Disp: , Rfl:  .  sevelamer carbonate (RENVELA) 2.4 g PACK, Take by mouth 3 (three) times daily with meals. And with any snacks , Disp: , Rfl:  .  tamsulosin (FLOMAX) 0.4 MG CAPS capsule, Take 0.4 mg by mouth daily after supper. , Disp: , Rfl:  .  Zinc Oxide (BALMEX EX), Apply 1 application topically daily as needed (to irritated areas of skin). , Disp: , Rfl:   Exam: Current vital signs: BP (!) 100/58   Pulse 87   Temp (!) 97.2 F (36.2 C) (Oral)   Resp (!) 23   SpO2 98%  Vital signs in last 24 hours: Temp:  [97.2 F (36.2 C)] 97.2 F (36.2 C) (01/27 0851) Pulse Rate:  [83-87] 87 (01/27 1004) Resp:  [22-23] 23 (01/27 1004) BP: (92-100)/(46-59) 100/58  (01/27 1004) SpO2:  [92 %-98 %] 98 % (01/27 1004) General: Awake alert in no distress HEENT: Normocephalic atraumatic Lungs: Clear to auscultation Cardiovascular: Regular rate rhythm Abdomen: Nondistended nontender soft Extremities warm well perfused with the right medial malleolus in a bandage Neurological exam Awake alert oriented x3 Speech-has a stutter. No evidence of aphasia Able to follow all commands consistently-single step and multistep. Cranial nerves: Pupils equal round react to light, extraocular movements intact, visual fields are full to confrontation, facial sensation is intact, left lower face appears to not lift up when smiling and also appears droopy at rest.  He is able to close his eyes tightly and has no upper facial weakness on either side.  Auditory acuity intact, tongue and palate midline. Motor exam:  Both upper extremities are antigravity without drift.  Both his lower extremities are antigravity but have a mild drift with the drift being slightly worse on the left leg compared to the right. Sensory exam: Intact to light touch Coordination: No dysmetria Gait testing deferred at this time NIH stroke scale-4  Labs I have reviewed labs in epic and the results pertinent to this consultation are:  CBC    Component Value Date/Time   WBC 10.8 (H) 10/07/2019 0858   RBC 4.14 (L) 10/07/2019 0858   HGB 13.9 10/07/2019 0902   HGB 12.5 (L) 05/11/2019 1037   HCT 41.0 10/07/2019 0902   HCT 37.5 05/11/2019 1037   PLT 209 10/07/2019 0858   PLT 169 05/11/2019 1037   MCV 102.7 (H) 10/07/2019 0858   MCV 92 05/11/2019 1037   MCH 32.4 10/07/2019 0858   MCHC 31.5 10/07/2019 0858   RDW 13.0 10/07/2019 0858   RDW 14.1 05/11/2019 1037   LYMPHSABS 1.3 10/07/2019 0858   LYMPHSABS 1.6 05/11/2019 1037   MONOABS 0.8 10/07/2019 0858   EOSABS 0.0 10/07/2019 0858   EOSABS 0.3 05/11/2019 1037   BASOSABS 0.0 10/07/2019 0858   BASOSABS 0.0 05/11/2019 1037    CMP     Component  Value Date/Time   NA 134 (L) 10/07/2019 0902   NA 129 (L) 05/11/2019 1037   K 4.4 10/07/2019 0902   CL 94 (L) 10/07/2019 0902   CO2 27 10/07/2019 0858   GLUCOSE 158 (H) 10/07/2019 0902   BUN 49 (H) 10/07/2019 0902   BUN 79 (HH) 05/11/2019 1037   CREATININE 7.10 (H) 10/07/2019 0902   CALCIUM 9.5 10/07/2019 0858   PROT 7.3 10/07/2019 0858   PROT 7.2 05/11/2019 1037   ALBUMIN 3.5 10/07/2019 0858   ALBUMIN 4.0 05/11/2019 1037   AST 40 10/07/2019 0858   ALT 24 10/07/2019 0858   ALKPHOS 99 10/07/2019 0858   BILITOT 0.4 10/07/2019 0858   BILITOT 0.3 05/11/2019 1037   GFRNONAA 7 (L) 10/07/2019 0858   GFRAA 8 (L) 10/07/2019 0858   Imaging I have reviewed the images obtained:  CT-scan of the brain-no acute changes CT angiogram head and neck-no emergent large vessel occlusion.  There is plaque at the proximal right ICA that is causing about 75% stenosis. CT perfusion study does not show any perfusion deficit.  Assessment:  79 year old man with above past medical history brought in for concerns for stroke due to gradually progressive weakness and on objective exam left facial droop and possible bilateral lower extremity weakness left worse than right. He is outside the window for IV TPA. On my examination he did have some dysarthria-more of stuttering, drift in both his lower extremities and a left facial droop, for which he got an NIH stroke scale of 4. His CTA head and neck shows a 75% stenosis of his right ICA-given the left-sided symptoms, he could have had a hypotensive episode or an episode of hypoperfusion causing these symptoms or frankly could have stroked out territory in the right MCA supplied brain. Due to ongoing nature of symptoms, I think he would benefit from an admission and further work-up  Impression: #Left-sided weakness-evaluate for stroke/TIA secondary to possibly symptomatic right carotid stenosis #Evaluate for progressive generalized  weakness  Recommendations: Left-sided weakness-evaluate for stroke/TIA secondary to possibly symptomatic right carotid stenosis -Admit for observation -MRI brain without contrast -2D echo -A1c -Lipid panel -Telemetry -Frequent neurochecks -Aspirin 325+ Plavix 75 daily -PT -OT -Speech therapy -Allow for permissive hypertension-keep pressures  SBP>140 and treat with antihypertensives only if systolics over [id number]. -Treatment of the carotid stenosis-timing will depend on MRI findings.  #Evaluate for progressive generalized weakness -Check B12, vitamin D and TSH levels -Check UA chest x-ray  Relayed my plan to Dr. Wyvonnia Dusky  Stroke team will follow the patient -- Amie Portland, MD [facility] Pager: [phone] If 7pm to 7am, please call on call as listed on AMION.

## 2019-10-07 NOTE — Code Documentation (Signed)
79 yo male coming from home where he lives by himself. Pt was LKW at 1500 yesterday when he received dialysis. This morning, pt was seen by daughter and noted to be slumping in his chair and unable to walk well. Daughter reports a fall last week with decline in walking since then. Pt was driven to the emergency department and Code Stroke activated by EDP. Stroke Team met patient in CT. Initial NIHSS 4 due to bilateral leg weakness, left facial droop, and slurred speech. Noted to be VAN -. CT/CTA/CTP completed. No LVO noted on imaging. Pt brought back to ED 37 and placed on the cardiac monitor. Not a tPA candidate due to being outside the window. Not an IR candidate. Code Stroke cancelled. Handoff given to [employer], Therapist, sports.

## 2019-10-07 NOTE — Consult Note (Addendum)
[facility] Renal Consultation Note    Indication for Consultation:  Management of ESRD/hemodialysis; anemia, hypertension/volume and secondary hyperparathyroidism  [id number], Nino Glow, MD  HPI: Frank [name] is a 79 y.o. male. 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  Past medical history significant for HTN, DMT2, gout, hypothyroidism, HLD, and cataracts. Of note patient is compliant with prescribed dialysis regimen.    Patient seen and examined at bedside.  Reports slurred speech, weakness and facial droop starting last night.  Woke up and had trouble lifting his sheets and opening his phone due to weakness.  Notes his lower extremities were jerking/"kicking out".  When he attempted to drink water is poured all over his beard.  Noticed his stutter had worsened and his speech was slurred.  Reports nausea, vomiting and diarrhea as well.  Had an episode of diarrhea last weak but it had improved until yesterday.  Reports a few drops in blood pressure during dialysis yesterday but overall it went well.  Today he is feeling better, symptoms have improved.  Speech is back to baseline and legs no longer "kicking out.".  Denies CP, SOB, weakness, dizziness, abdominal pain, n/v/d and fatigue.    Pertinent findings since admission include negative flu and COVID, CXR with no acute abnormalities, CT head showing atrophy and no acute abnormality, CT angio head shows no large vessel occulusion or core infarction and CT neck shows 75% stenosis of proximal R ICA.  Patient has been admitted for further evaluation and treatment.     Past Medical History:  Diagnosis Date  . Anemia of chronic kidney failure   . Arthritis   . Bradycardia   . Cataracts, bilateral 05/15/2012  . Chronic idiopathic gout of multiple sites 02/10/2015  . Chronic kidney disease    [facility]  . Depression   . Diabetes mellitus without complication (HCC)    diet controlled    . Diabetic nephropathy ([place])   . ED (erectile dysfunction) 08/28/2012  . Elevated liver enzymes 06/10/2019  . Epiretinal membrane (ERM) of right eye 06/10/2019   Right eye saw AE 06/05/2019    . Essential hypertension 05/07/2013   Last Assessment & Plan:  Formatting of this note might be different from the original. HTN PLAN   BP goal is <140/90 BP Readings from Last 3 Encounters:  08/27/16 140/79  08/13/16 141/80  08/06/16 144/86   Current Anti-Hyptensive Medications: None  Today's Recommendations: Continue lifestyle modifications. Defer starting medication to PCP.  Recommend continued regular exercise as tolerated. Recomm  . History of UTI    s/p foley in hospital 01/2019   . Hyperlipidemia   . Hypothyroidism   . Illiterate 11/19/2013   Last Assessment & Plan:  Assisted patient with Handicapped Placard application today.    . Insomnia 05/08/2019  . Neuropathy   . Peripheral neuropathy   . Peripheral vascular disease ([place])   . Secondary hyperparathyroidism ([place])    Renal  . Thyroid disease    Past Surgical History:  Procedure Laterality Date  . AV FISTULA PLACEMENT Left 11/19/2017   Procedure: ARTERIOVENOUS (AV) FISTULA CREATION;  Surgeon: [name], MD;  Location: [facility];  Service: Vascular;  Laterality: Left;  . BASCILIC VEIN TRANSPOSITION Left 06/04/2018   Procedure: BASILIC VEIN TRANSPOSITION ARM;  Surgeon: [name], MD;  Location: [place];  Service: Vascular;  Laterality: Left;  . COLONOSCOPY    . DIALYSIS/PERMA CATHETER REMOVAL N/A 03/23/2019   Procedure: DIALYSIS/PERMA  CATHETER REMOVAL;  Surgeon: [name], MD;  Location: [facility];  Service: Cardiovascular;  Laterality: N/A;  . EYE SURGERY     cataract surgery b/l; photophobia since   . INSERTION OF DIALYSIS CATHETER N/A 01/23/2019   Procedure: INSERTION OF DIALYSIS CATHETER;  Surgeon: [name], MD;  Location: [facility];  Service: Vascular;  Laterality: N/A;  INSERTION OF DIALYSIS  CATHETER  . KNEE ARTHROSCOPY Left    Knee  . PERIPHERAL VASCULAR THROMBECTOMY Left 07/06/2019   Procedure: PERIPHERAL VASCULAR THROMBECTOMY;  Surgeon: [name], MD;  Location: [place] [facility];  Service: Cardiovascular;  Laterality: Left;  . THROMBECTOMY W/ EMBOLECTOMY Left 01/27/2019   Procedure: THROMBECTOMY Left arm ARTERIOVENOUS FISTULA  and Left arm Arteriovenous gortex graft;  Surgeon: [name], MD;  Location: [facility];  Service: Vascular;  Laterality: Left;  Marland Kitchen VENOGRAM Left 01/27/2019   Procedure: Left arm FISTULOGRAM/;  Surgeon: [name], MD;  Location: [facility] OR;  Service: Vascular;  Laterality: Left;   Family History  Problem Relation Age of Onset  . Diabetes Mother   . Kidney disease Mother   . Liver disease Mother   . Heart Problems Mother    Social History:  reports that he quit smoking about 27 years ago. He quit after 30.00 years of use. He has never used smokeless tobacco. He reports that he does not drink alcohol or use drugs. Allergies  Allergen Reactions  . Ace Inhibitors Other (See Comments)    Hyperkalemia on ACE INHIBITORS Cr>1.9  . Angiotensin Receptor Blockers Other (See Comments)    Hyperkalemia Elevates Cr > 1.9   . Ivp Dye [Iodinated Diagnostic Agents] Other (See Comments)    Cannot have due to renal failure  . Adhesive [Tape] Other (See Comments)    UNDESCRIBED REACTION Can tolerate ONLY Coban wrap or mild paper tape!!   Prior to Admission medications   Medication Sig Start Date End Date Taking? Authorizing Provider  allopurinol (ZYLOPRIM) 100 MG tablet Take 1 tablet (100 mg total) by mouth daily. 06/03/19   McLean-Scocuzza, Nino Glow, MD  aspirin EC 81 MG tablet Take 81 mg daily by mouth.    [provider]  atorvastatin (LIPITOR) 20 MG tablet Take 20 mg by mouth daily at 6 PM.     [provider]  docusate sodium (COLACE) 100 MG capsule Take 200 mg by mouth daily as needed.     [provider]  gabapentin  (NEURONTIN) 300 MG capsule Take 1 capsule (300 mg total) by mouth daily. At 3 PM 05/08/19   McLean-Scocuzza, Nino Glow, MD  ketoconazole (NIZORAL) 2 % shampoo Apply 1 application topically 2 (two) times a week. Patient taking differently: Apply 1 application topically as needed.  06/11/19   McLean-Scocuzza, Nino Glow, MD  levothyroxine (SYNTHROID) 150 MCG tablet Take 150 mcg by mouth daily before breakfast.    [provider]  lidocaine-prilocaine (EMLA) cream APPLY SMALL AMOUNT TO ACCESS SITE (AVF) 1 TO 2 HOURS BEFORE DIALYSIS. COVER WITH OCCLUSIVE DRESSING (SARAN WRAP) 04/08/19   [provider]  midodrine (PROAMATINE) 10 MG tablet Take 1 tablet (10 mg total) by mouth 3 (three) times a week. Take within 1/2 hour prior to dialysis start 09/16/19   Baldwin Jamaica, PA-C  multivitamin (RENA-VIT) TABS tablet Take 1 tablet daily by mouth.    [provider]  ondansetron (ZOFRAN) 4 MG tablet Take 4 mg by mouth every 8 (eight) hours as needed for  nausea or vomiting.  01/19/19   [provider]  sevelamer carbonate (RENVELA) 2.4 g PACK Take by mouth 3 (three) times daily with meals. And with any snacks     [provider]  tamsulosin (FLOMAX) 0.4 MG CAPS capsule Take 0.4 mg by mouth daily after supper.     [provider]  Zinc Oxide (BALMEX EX) Apply 1 application topically daily as needed (to irritated areas of skin).     [provider]   Current Facility-Administered Medications  Medication Dose Route Frequency Provider Last Rate Last Admin  . 0.9 %  sodium chloride infusion  250 mL Intravenous Continuous Rancour, Stephen, MD 10 mL/hr at 10/07/19 1134 250 mL at 10/07/19 1134  . 0.9 %  sodium chloride infusion   Intravenous Continuous Chesley Mires, MD      . acetaminophen (TYLENOL) tablet 650 mg  650 mg Oral Q4H PRN Chesley Mires, MD       Or  . acetaminophen (TYLENOL) 160 MG/5ML solution 650 mg  650 mg Per Tube Q4H PRN Chesley Mires, MD       Or   . acetaminophen (TYLENOL) suppository 650 mg  650 mg Rectal Q4H PRN Chesley Mires, MD      . Chlorhexidine Gluconate Cloth 2 % PADS 6 each  6 each Topical Daily Chesley Mires, MD   6 each at 10/07/19 1242  . heparin injection 5,000 Units  5,000 Units Subcutaneous Q8H Chesley Mires, MD   5,000 Units at 10/07/19 1339  . levothyroxine (SYNTHROID, LEVOTHROID) injection 75 mcg  75 mcg Intravenous Daily Chesley Mires, MD      . phenylephrine (NEOSYNEPHRINE) 10-0.9 MG/250ML-% infusion  25-200 mcg/min Intravenous Titrated Rancour, Annie Main, MD 180 mL/hr at 10/07/19 1458 120 mcg/min at 10/07/19 1458   Labs: Basic Metabolic Panel: Recent Labs  Lab 10/07/19 0858 10/07/19 0902  NA 137 134*  K 4.6 4.4  CL 92* 94*  CO2 27  --   GLUCOSE 163* 158*  BUN 54* 49*  CREATININE 7.02* 7.10*  CALCIUM 9.5  --    Liver Function Tests: Recent Labs  Lab 10/07/19 0858  AST 40  ALT 24  ALKPHOS 99  BILITOT 0.4  PROT 7.3  ALBUMIN 3.5   No results for input(s): LIPASE, AMYLASE in the last 168 hours. Recent Labs  Lab 10/07/19 1003  AMMONIA 20   CBC: Recent Labs  Lab 10/07/19 0858 10/07/19 0902  WBC 10.8*  --   NEUTROABS 8.6*  --   HGB 13.4 13.9  HCT 42.5 41.0  MCV 102.7*  --   PLT 209  --    CBG: Recent Labs  Lab 10/07/19 0848  GLUCAP 154*   Studies/Results: CT ANGIO HEAD W OR WO CONTRAST  Result Date: 10/07/2019 CLINICAL DATA:  Code stroke, follow-up EXAM: CT ANGIOGRAPHY HEAD AND NECK CT PERFUSION BRAIN TECHNIQUE: Multidetector CT imaging of the head and neck was performed using the standard protocol during bolus administration of intravenous contrast. Multiplanar CT image reconstructions and MIPs were obtained to evaluate the vascular anatomy. Carotid stenosis measurements (when applicable) are obtained utilizing NASCET criteria, using the distal internal carotid diameter as the denominator. Multiphase CT imaging of the brain was performed following IV bolus contrast injection. Subsequent  parametric perfusion maps were calculated using RAPID software. CONTRAST:  152mL OMNIPAQUE IOHEXOL 350 MG/ML SOLN COMPARISON:  None. FINDINGS: CTA NECK FINDINGS Aortic arch: Great vessel origins are patent. There is right origin of the left vertebral artery from the arch, with  calcified plaque noted at the origin. Right carotid system: Patent. There is calcified and noncalcified plaque along the proximal ICA causing 75% stenosis. Left carotid system: Patent. There is calcified plaque at the ICA origin causing minimal stenosis. Vertebral arteries: Patent. Calcified plaque at both vertebral artery origins without evidence of flow limitation. Right vertebral artery is dominant. Skeleton: Degenerative changes. Other neck: No mass or adenopathy. Upper chest: No apical lung mass. Review of the MIP images confirms the above findings CTA HEAD FINDINGS Anterior circulation: Intracranial internal carotid arteries are patent with calcified plaque causing mild stenosis. Anterior and middle cerebral arteries are patent. Posterior circulation: Intracranial vertebral arteries are patent with calcified plaque causing moderate stenosis on the left. Basilar artery is patent. Posterior cerebral arteries are patent with bilateral posterior communicating arteries present. Venous sinuses: As permitted by contrast timing, patent. Review of the MIP images confirms the above findings CT Brain Perfusion Findings: CBF (<30%) Volume: 62mL Perfusion (Tmax>6.0s) volume: 65mL Mismatch Volume: 52mL Infarction Location: None IMPRESSION: No large vessel occlusion. No evidence of core infarction or territory at risk by perfusion imaging. Plaque at the proximal right ICA causes 75% stenosis. There is minimal stenosis of the left ICA origin. Electronically Signed   By: Macy Mis M.D.   On: 10/07/2019 09:41   CT ANGIO NECK W OR WO CONTRAST  Result Date: 10/07/2019 CLINICAL DATA:  Code stroke, follow-up EXAM: CT ANGIOGRAPHY HEAD AND NECK CT  PERFUSION BRAIN TECHNIQUE: Multidetector CT imaging of the head and neck was performed using the standard protocol during bolus administration of intravenous contrast. Multiplanar CT image reconstructions and MIPs were obtained to evaluate the vascular anatomy. Carotid stenosis measurements (when applicable) are obtained utilizing NASCET criteria, using the distal internal carotid diameter as the denominator. Multiphase CT imaging of the brain was performed following IV bolus contrast injection. Subsequent parametric perfusion maps were calculated using RAPID software. CONTRAST:  11mL OMNIPAQUE IOHEXOL 350 MG/ML SOLN COMPARISON:  None. FINDINGS: CTA NECK FINDINGS Aortic arch: Great vessel origins are patent. There is right origin of the left vertebral artery from the arch, with calcified plaque noted at the origin. Right carotid system: Patent. There is calcified and noncalcified plaque along the proximal ICA causing 75% stenosis. Left carotid system: Patent. There is calcified plaque at the ICA origin causing minimal stenosis. Vertebral arteries: Patent. Calcified plaque at both vertebral artery origins without evidence of flow limitation. Right vertebral artery is dominant. Skeleton: Degenerative changes. Other neck: No mass or adenopathy. Upper chest: No apical lung mass. Review of the MIP images confirms the above findings CTA HEAD FINDINGS Anterior circulation: Intracranial internal carotid arteries are patent with calcified plaque causing mild stenosis. Anterior and middle cerebral arteries are patent. Posterior circulation: Intracranial vertebral arteries are patent with calcified plaque causing moderate stenosis on the left. Basilar artery is patent. Posterior cerebral arteries are patent with bilateral posterior communicating arteries present. Venous sinuses: As permitted by contrast timing, patent. Review of the MIP images confirms the above findings CT Brain Perfusion Findings: CBF (<30%) Volume: 32mL  Perfusion (Tmax>6.0s) volume: 58mL Mismatch Volume: 47mL Infarction Location: None IMPRESSION: No large vessel occlusion. No evidence of core infarction or territory at risk by perfusion imaging. Plaque at the proximal right ICA causes 75% stenosis. There is minimal stenosis of the left ICA origin. Electronically Signed   By: Macy Mis M.D.   On: 10/07/2019 09:41   DG Chest Portable 1 View  Result Date: 10/07/2019 CLINICAL DATA:  Pt from home with  daughter LSN yesterday at 1500. Daughter saw pt this morning slumped over in a chair to the right side. Pt has been increasingly weak over the past week. EXAM: PORTABLE CHEST 1 VIEW COMPARISON:  01/23/2019 FINDINGS: There are low lung volumes. Mild linear opacity noted at the right lung base consistent with atelectasis. Lungs otherwise clear. No pleural effusion or pneumothorax. Cardiac silhouette is normal in size. No mediastinal or hilar masses. Skeletal structures are grossly intact. IMPRESSION: No acute cardiopulmonary disease. Electronically Signed   By: Lajean Manes M.D.   On: 10/07/2019 09:57   CT HEAD CODE STROKE WO CONTRAST  Result Date: 10/07/2019 CLINICAL DATA:  Code stroke. Focal neuro deficit, greater than 6 hours, stroke suspected, aphasia and facial droop. EXAM: CT HEAD WITHOUT CONTRAST TECHNIQUE: Contiguous axial images were obtained from the base of the skull through the vertex without intravenous contrast. COMPARISON:  No pertinent prior studies available for comparison. FINDINGS: Brain: No evidence of acute intracranial hemorrhage. No demarcated cortical infarction. No evidence of intracranial mass. No midline shift or extra-axial fluid collection. Mild generalized parenchymal atrophy. Vascular: No hyperdense vessel.  Atherosclerotic calcifications. Skull: Normal. Negative for fracture or focal lesion. Sinuses/Orbits: Visualized orbits demonstrate no acute abnormality. No significant paranasal sinus disease or mastoid effusion at the imaged  levels. These results were communicated to Dr. Rory Percy at [place] 1/27/2021by text page via the [facility] messaging system. IMPRESSION: No CT evidence of acute intracranial abnormality. Mild generalized parenchymal atrophy. Electronically Signed   By: Kellie Simmering DO   On: 10/07/2019 09:16   CT CEREBRAL PERFUSION W CM (CHARM STUDY)  Result Date: 10/07/2019 CLINICAL DATA:  Code stroke, follow-up EXAM: CT ANGIOGRAPHY HEAD AND NECK CT PERFUSION BRAIN TECHNIQUE: Multidetector CT imaging of the head and neck was performed using the standard protocol during bolus administration of intravenous contrast. Multiplanar CT image reconstructions and MIPs were obtained to evaluate the vascular anatomy. Carotid stenosis measurements (when applicable) are obtained utilizing NASCET criteria, using the distal internal carotid diameter as the denominator. Multiphase CT imaging of the brain was performed following IV bolus contrast injection. Subsequent parametric perfusion maps were calculated using RAPID software. CONTRAST:  112mL OMNIPAQUE IOHEXOL 350 MG/ML SOLN COMPARISON:  None. FINDINGS: CTA NECK FINDINGS Aortic arch: Great vessel origins are patent. There is right origin of the left vertebral artery from the arch, with calcified plaque noted at the origin. Right carotid system: Patent. There is calcified and noncalcified plaque along the proximal ICA causing 75% stenosis. Left carotid system: Patent. There is calcified plaque at the ICA origin causing minimal stenosis. Vertebral arteries: Patent. Calcified plaque at both vertebral artery origins without evidence of flow limitation. Right vertebral artery is dominant. Skeleton: Degenerative changes. Other neck: No mass or adenopathy. Upper chest: No apical lung mass. Review of the MIP images confirms the above findings CTA HEAD FINDINGS Anterior circulation: Intracranial internal carotid arteries are patent with calcified plaque causing mild stenosis. Anterior and middle cerebral  arteries are patent. Posterior circulation: Intracranial vertebral arteries are patent with calcified plaque causing moderate stenosis on the left. Basilar artery is patent. Posterior cerebral arteries are patent with bilateral posterior communicating arteries present. Venous sinuses: As permitted by contrast timing, patent. Review of the MIP images confirms the above findings CT Brain Perfusion Findings: CBF (<30%) Volume: 61mL Perfusion (Tmax>6.0s) volume: 74mL Mismatch Volume: 22mL Infarction Location: None IMPRESSION: No large vessel occlusion. No evidence of core infarction or territory at risk by perfusion imaging. Plaque at the proximal right ICA  causes 75% stenosis. There is minimal stenosis of the left ICA origin. Electronically Signed   By: Macy Mis M.D.   On: 10/07/2019 09:41    ROS: All others negative except those listed in HPI.  Physical Exam: Vitals:   10/07/19 1330 10/07/19 1345 10/07/19 1400 10/07/19 1415  BP: 107/67 125/76 122/67 118/71  Pulse: 81 82 (!) 43 79  Resp: 19 20 18 18   Temp:      TempSrc:      SpO2: 95% 98% 97% 92%  Weight:      Height:         General: WDWN, NAD, chronically ill appearing male Head: NCAT sclera not icteric  Neck: Supple. No lymphadenopathy Lungs: mostly CTA bilaterally, scattered rhonchi. No wheezes or rales. Breathing is unlabored. Heart: RRR. No murmur, rubs or gallops.  Abdomen: soft, nontender, +BS, no guarding, no rebound tenderness Lower extremities:no edema, +chronic ischemic changes b/l Neuro: AAOx3. Moves all extremities spontaneously. Psych:  Responds to questions appropriately with a normal affect. Dialysis Access: LU AVG faint bruit  Dialysis Orders:  TTS - [facility]  4hrs, BFR 400, DFR 800,  EDW 92kg, 2K/ 2.25Ca  Access: LU AVG  Heparin 5000 Calcitriol 0.5 mcg PO qHD   Assessment/Plan: 1.  Lt sided weakness w/slurred speech/facial droop - Concern for stroke, Rt ICA stenosis noted on CT angio neck.  MRI brain,  ECHO, lipid panel and A1C ordered. Per neuro 2.  ESRD -  On HD TTS, orders written for HD tomorrow per regular schedule. K 4.4.  3.  Hypotension/volume  - Blood pressure soft, on midodrine as OP. Does not appear volume overloaded.  Close to dry if weights correct.  Minimal UF with HD tomorrow.  4.  Anemia of CKD - Hgb 13.9.  No indication for ESA at this time.  5.  Secondary Hyperparathyroidism -  Ca and phos at goal.  Continue binders and VDRA.  6.  Nutrition - Renal diet w/fluid restrictions once advanced. Vit 7. DM - per primary 8. Hypothyroidism 9. Dysphagia - speech to assess  Jen Mow, PA-C [facility] Pager: [phone] 10/07/2019, 3:06 PM

## 2019-10-07 NOTE — H&P (Signed)
NAME:  Frank [name], MRN:  [id number], DOB:  1941/04/12, LOS: 0 ADMISSION DATE:  10/07/2019, CONSULTATION DATE:  10/07/19 REFERRING MD:  Dr. Rory Percy, Neuro, CHIEF COMPLAINT:  Facial droop   Brief History   79 yo male former smoker presented to ER with weakness, facial droop and slurred speech with concern for CVA.  Seen by neurology.  Outside time window for thrombolytic therapy.  Noted to have low blood pressure and started on phenylephrine.  PCCM asked to admit to ICU.  Past Medical History  ESRD, DM, HTN, HLD, Hypothyroidism, Neuropathy, PAD, 2nd hyperparathyroidism, ED, Depression, Gout  Significant Hospital Events   1/27 Admit  Consults:  Neuro Renal  Procedures:    Significant Diagnostic Tests:  CT head 1/27 >> atrophy CT angio head/neck 1/27 >> 75% stenosis proximal Rt ICA  Micro Data:  SARS CoV2 PCR 1/27 >> negative Influenza PCR 1/27 >> A and B negative  Antimicrobials:    Interim history/subjective:    Objective   Blood pressure (!) 91/55, pulse 85, temperature 98.4 F (36.9 C), temperature source Rectal, resp. rate 18, SpO2 92 %.        Intake/Output Summary (Last 24 hours) at 10/07/2019 1146 Last data filed at 10/07/2019 1047 Gross per 24 hour  Intake 500 ml  Output --  Net 500 ml   There were no vitals filed for this visit.  Examination:  General - somnolent Eyes - pupils reactive ENT - no sinus tenderness, no stridor, garbled speech Cardiac - regular rate/rhythm, no murmur Chest - equal breath sounds b/l, no wheezing or rales Abdomen - soft, non tender, + bowel sounds Extremities - decreased muscle bulk, AV graft Lt upper arm Skin - venous stasis changes Neuro - moves extremities   Resolved Hospital Problem list     Assessment & Plan:   Lt sided weakness with slurred speech and facial droop. 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 - restart midodrine when able to take pills - phenylephrine with goal SBP > 140  DM nephropathy with ESRD. - consult renal for HD needs  DM poorly controlled. - SSI  DM neuropathy. - hold outpt neurontin  Hx of hypothyroidism. - continue synthroid - check TSH  Hx of gout. - resume allopurinol when able to swallow pills  Dysphagia.  - speech to assess  Best practice:  Diet: NPO DVT prophylaxis: SQ heparin GI prophylaxis: not indicated Mobility: bed rest Code Status: full code Disposition: ICU  Labs   CBC: Recent Labs  Lab 10/07/19 0858 10/07/19 0902  WBC 10.8*  --   NEUTROABS 8.6*  --   HGB 13.4 13.9  HCT 42.5 41.0  MCV 102.7*  --   PLT 209  --     Basic Metabolic Panel: Recent Labs  Lab 10/07/19 0858 10/07/19 0902  NA 137 134*  K 4.6 4.4  CL 92* 94*  CO2 27  --   GLUCOSE 163* 158*  BUN 54* 49*  CREATININE 7.02* 7.10*  CALCIUM 9.5  --    GFR: CrCl cannot be calculated (Unknown ideal weight.). Recent Labs  Lab 10/07/19 0858 10/07/19 1003  WBC 10.8*  --   LATICACIDVEN  --  1.6    Liver Function Tests: Recent Labs  Lab 10/07/19 0858  AST 40  ALT  24  ALKPHOS 99  BILITOT 0.4  PROT 7.3  ALBUMIN 3.5   No results for input(s): LIPASE, AMYLASE in the last 168 hours. Recent Labs  Lab 10/07/19 1003  AMMONIA 20    ABG    Component Value Date/Time   TCO2 31 10/07/2019 0902     Coagulation Profile: Recent Labs  Lab 10/07/19 0858  INR 1.0    Cardiac Enzymes: No results for input(s): CKTOTAL, CKMB, CKMBINDEX, TROPONINI in the last 168 hours.  HbA1C: Hgb A1c MFr Bld  Date/Time Value Ref Range Status  05/11/2019 10:37 AM 6.6 (H) 4.8 - 5.6 % Final    Comment:             Prediabetes: 5.7 - 6.4          Diabetes: >6.4          Glycemic control for adults with diabetes: <7.0   01/28/2019 01:30 PM 6.6 (H) 4.8 - 5.6 % Final     Comment:    (NOTE) Pre diabetes:          5.7%-6.4% Diabetes:              >6.4% Glycemic control for   <7.0% adults with diabetes     CBG: Recent Labs  Lab 10/07/19 0848  GLUCAP 154*    Review of Systems:   Unable to obtain.  Past Medical History  He,  has a past medical history of Anemia of chronic kidney failure, Arthritis, Bradycardia, Cataracts, bilateral (05/15/2012), Chronic idiopathic gout of multiple sites (02/10/2015), Chronic kidney disease, Depression, Diabetes mellitus without complication ([place]), Diabetic nephropathy ([place]), ED (erectile dysfunction) (08/28/2012), Elevated liver enzymes (06/10/2019), Epiretinal membrane (ERM) of right eye (06/10/2019), Essential hypertension (05/07/2013), History of UTI, Hyperlipidemia, Hypothyroidism, Illiterate (11/19/2013), Insomnia (05/08/2019), Neuropathy, Peripheral neuropathy, Peripheral vascular disease ([place]), Secondary hyperparathyroidism ([place]), and Thyroid disease.   Surgical History    Past Surgical History:  Procedure Laterality Date  . AV FISTULA PLACEMENT Left 11/19/2017   Procedure: ARTERIOVENOUS (AV) FISTULA CREATION;  Surgeon: [name], MD;  Location: [facility];  Service: Vascular;  Laterality: Left;  . BASCILIC VEIN TRANSPOSITION Left 06/04/2018   Procedure: BASILIC VEIN TRANSPOSITION ARM;  Surgeon: [name], MD;  Location: [facility];  Service: Vascular;  Laterality: Left;  . COLONOSCOPY    . DIALYSIS/PERMA CATHETER REMOVAL N/A 03/23/2019   Procedure: DIALYSIS/PERMA CATHETER REMOVAL;  Surgeon: [name], MD;  Location: [facility];  Service: Cardiovascular;  Laterality: N/A;  . EYE SURGERY     cataract surgery b/l; photophobia since   . INSERTION OF DIALYSIS CATHETER N/A 01/23/2019   Procedure: INSERTION OF DIALYSIS CATHETER;  Surgeon: [name], MD;  Location: [facility];  Service: Vascular;  Laterality: N/A;  INSERTION OF DIALYSIS CATHETER  . KNEE ARTHROSCOPY Left    Knee  . PERIPHERAL  VASCULAR THROMBECTOMY Left 07/06/2019   Procedure: PERIPHERAL VASCULAR THROMBECTOMY;  Surgeon: [name], MD;  Location: [facility];  Service: Cardiovascular;  Laterality: Left;  . THROMBECTOMY W/ EMBOLECTOMY Left 01/27/2019   Procedure: THROMBECTOMY Left arm ARTERIOVENOUS FISTULA  and Left arm Arteriovenous gortex graft;  Surgeon: [name], MD;  Location: [place];  Service: Vascular;  Laterality: Left;  Marland Kitchen VENOGRAM Left 01/27/2019   Procedure: Left arm FISTULOGRAM/;  Surgeon: [name], MD;  Location[facility];  Service: Vascular;  Laterality: Left;     Social History   reports that he quit smoking about 27 years  ago. He quit after 30.00 years of use. He has never used smokeless tobacco. He reports that he does not drink alcohol or use drugs.   Family History   His family history includes Diabetes in his mother; Heart Problems in his mother; Kidney disease in his mother; Liver disease in his mother.   Allergies Allergies  Allergen Reactions  . Ace Inhibitors Other (See Comments)    Hyperkalemia on ACE INHIBITORS Cr>1.9  . Angiotensin Receptor Blockers Other (See Comments)    Hyperkalemia Elevates Cr > 1.9   . Ivp Dye [Iodinated Diagnostic Agents] Other (See Comments)    Cannot have due to renal failure  . Adhesive [Tape] Other (See Comments)    UNDESCRIBED REACTION Can tolerate ONLY Coban wrap or mild paper tape!!     Home Medications  Prior to Admission medications   Medication Sig Start Date End Date Taking? Authorizing Provider  allopurinol (ZYLOPRIM) 100 MG tablet Take 1 tablet (100 mg total) by mouth daily. 06/03/19   McLean-Scocuzza, Nino Glow, MD  aspirin EC 81 MG tablet Take 81 mg daily by mouth.    [provider]  atorvastatin (LIPITOR) 20 MG tablet Take 20 mg by mouth daily at 6 PM.     [provider]  docusate sodium (COLACE) 100 MG capsule Take 200 mg by mouth daily as needed.     [provider]  gabapentin  (NEURONTIN) 300 MG capsule Take 1 capsule (300 mg total) by mouth daily. At 3 PM 05/08/19   McLean-Scocuzza, Nino Glow, MD  ketoconazole (NIZORAL) 2 % shampoo Apply 1 application topically 2 (two) times a week. Patient taking differently: Apply 1 application topically as needed.  06/11/19   McLean-Scocuzza, Nino Glow, MD  levothyroxine (SYNTHROID) 150 MCG tablet Take 150 mcg by mouth daily before breakfast.    [provider]  lidocaine-prilocaine (EMLA) cream APPLY SMALL AMOUNT TO ACCESS SITE (AVF) 1 TO 2 HOURS BEFORE DIALYSIS. COVER WITH OCCLUSIVE DRESSING (SARAN WRAP) 04/08/19   [provider]  midodrine (PROAMATINE) 10 MG tablet Take 1 tablet (10 mg total) by mouth 3 (three) times a week. Take within 1/2 hour prior to dialysis start 09/16/19   Baldwin Jamaica, PA-C  multivitamin (RENA-VIT) TABS tablet Take 1 tablet daily by mouth.    [provider]  ondansetron (ZOFRAN) 4 MG tablet Take 4 mg by mouth every 8 (eight) hours as needed for nausea or vomiting.  01/19/19   [provider]  sevelamer carbonate (RENVELA) 2.4 g PACK Take by mouth 3 (three) times daily with meals. And with any snacks     [provider]  tamsulosin (FLOMAX) 0.4 MG CAPS capsule Take 0.4 mg by mouth daily after supper.     [provider]  Zinc Oxide (BALMEX EX) Apply 1 application topically daily as needed (to irritated areas of skin).     [provider]    CC time 32 minutes  Chesley Mires, MD [place] 10/07/2019, 12:00 PM

## 2019-10-07 NOTE — ED Provider Notes (Addendum)
[facility] EMERGENCY DEPARTMENT Provider Note   CSN: [id number] Arrival date & time: 10/07/19  [id number]     History Chief Complaint  Patient presents with  . Code Stroke    Frank [name] is a 79 y.o. male.  Patient presents from home with weakness that he states is all over but seems to be worse than the left side of his body.  His last normal was approximately 3 PM yesterday after dialysis.  He is on dialysis, Tuesday, Thursday, Saturday as well as has diabetes.  He reports she is last seen normal was about 3 PM after dialysis yesterday.  Today his family found that he was too weak to walk and he was having trouble trying to use his phone and trouble drinking.  He states when he tries to drink everything slides out of his mouth.  He has some stuttering speech at baseline which appears to be worse today.  He denies any pain of his chronic back pain.  No cough or fever.  No chest pain or shortness of breath.  No abdominal pain, nausea or vomiting. Code stroke was activated on initial assessment.  Patient's daughter reported to Dr. Malen Gauze of neurology the patient has had increased difficulty walking and generalized weakness for the past week or more.  Today she noticed that he was slumped to the right side had a more pronounced facial droop on the left.  Daughter also did report patient had a fever of 102 this morning but he is afebrile here.  Patient states he had several episodes of vomiting last night.  His only pain complaint is low back pain which is a chronic issue for him and he states there is no change from his baseline.  The history is provided by the patient. The history is limited by the condition of the patient.       Past Medical History:  Diagnosis Date  . Anemia of chronic kidney failure   . Arthritis   . Bradycardia   . Cataracts, bilateral 05/15/2012  . Chronic idiopathic gout of multiple sites 02/10/2015  . Chronic kidney disease    [facility]  .  Depression   . Diabetes mellitus without complication (HCC)    diet controlled   . Diabetic nephropathy ([place])   . ED (erectile dysfunction) 08/28/2012  . Elevated liver enzymes 06/10/2019  . Epiretinal membrane (ERM) of right eye 06/10/2019   Right eye saw AE 06/05/2019    . Essential hypertension 05/07/2013   Last Assessment & Plan:  Formatting of this note might be different from the original. HTN PLAN   BP goal is <140/90 BP Readings from Last 3 Encounters:  08/27/16 140/79  08/13/16 141/80  08/06/16 144/86   Current Anti-Hyptensive Medications: None  Today's Recommendations: Continue lifestyle modifications. Defer starting medication to PCP.  Recommend continued regular exercise as tolerated. Recomm  . History of UTI    s/p foley in hospital 01/2019   . Hyperlipidemia   . Hypothyroidism   . Illiterate 11/19/2013   Last Assessment & Plan:  Assisted patient with Handicapped Placard application today.    . Insomnia 05/08/2019  . Neuropathy   . Peripheral neuropathy   . Peripheral vascular disease ([place])   . Secondary hyperparathyroidism ([place])    Renal  . Thyroid disease     Patient Active Problem List   Diagnosis Date Noted  . Epiretinal membrane (ERM) of right eye 06/10/2019  . Elevated liver enzymes 06/10/2019  . Benign  prostatic hyperplasia 05/08/2019  . Insomnia 05/08/2019  . Coronary artery disease involving native coronary artery of native heart without angina pectoris 05/08/2019  . Atherosclerosis 05/08/2019  . Hyperphosphatemia 05/08/2019  . Secondary hyperparathyroidism ([place])   . Hyperlipidemia   . Gout   . Anemia of chronic kidney failure   . Near syncope 01/21/2019  . Elevated troponin 01/21/2019  . Osteoarthritis of midfoot, right 08/13/2016  . Foot swelling 07/04/2016  . DM type 2 with diabetic peripheral neuropathy ([place]) 11/28/2015  . Pre-transplant evaluation for kidney transplant 11/28/2015  . HLD (hyperlipidemia) 08/01/2015  . Combined arterial insufficiency and  corporo-venous occlusive erectile dysfunction 02/10/2015  . Chronic idiopathic gout of multiple sites 02/10/2015  . Illiterate 11/19/2013  . ESRD (end stage renal disease) on dialysis ([place]) 05/07/2013  . Essential hypertension 05/07/2013  . Osteoarthritis 05/07/2013  . Diastasis recti 10/29/2012  . 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 ED (erectile dysfunction) 08/28/2012  . Cataracts, bilateral 05/15/2012  . Chronic pain 05/15/2012  . DM (diabetes mellitus) ([place]) 05/15/2012  . Gout of knee 05/15/2012  . Hyperkalemia 05/15/2012  . Hypothyroidism 05/15/2012    Past Surgical History:  Procedure Laterality Date  . AV FISTULA PLACEMENT Left 11/19/2017   Procedure: ARTERIOVENOUS (AV) FISTULA CREATION;  Surgeon: [name], MD;  Location: [place];  Service: Vascular;  Laterality: Left;  . BASCILIC VEIN TRANSPOSITION Left 06/04/2018   Procedure: BASILIC VEIN TRANSPOSITION ARM;  Surgeon: [name], MD;  Location: [place];  Service: Vascular;  Laterality: Left;  . COLONOSCOPY    . DIALYSIS/PERMA CATHETER REMOVAL N/A 03/23/2019   Procedure: DIALYSIS/PERMA CATHETER REMOVAL;  Surgeon: [name], MD;  Location: [place] [facility];  Service: Cardiovascular;  Laterality: N/A;  . EYE SURGERY     cataract surgery b/l; photophobia since   . INSERTION OF DIALYSIS CATHETER N/A 01/23/2019   Procedure: INSERTION OF DIALYSIS CATHETER;  Surgeon: [name], MD;  [facility];  Service: Vascular;  Laterality: N/A;  INSERTION OF DIALYSIS CATHETER  . KNEE ARTHROSCOPY Left    Knee  . PERIPHERAL VASCULAR THROMBECTOMY Left 07/06/2019   Procedure: PERIPHERAL VASCULAR THROMBECTOMY;  Surgeon: [name], MD;  Location: [place] [facility];  Service: Cardiovascular;  Laterality: Left;  . THROMBECTOMY W/ EMBOLECTOMY Left 01/27/2019   Procedure: THROMBECTOMY Left arm ARTERIOVENOUS FISTULA  and Left arm Arteriovenous gortex graft;   Surgeon: [name], MD;  Location: [place];  Service: Vascular;  Laterality: Left;  Marland Kitchen VENOGRAM Left 01/27/2019   Procedure: Left arm FISTULOGRAM/;  Surgeon: [name], MD;  Location: [facility] OR;  Service: Vascular;  Laterality: Left;       Family History  Problem Relation Age of Onset  . Diabetes Mother   . Kidney disease Mother   . Liver disease Mother   . Heart Problems Mother     Social History   Tobacco Use  . Smoking status: Former Smoker    Years: 30.00    Quit date: 1994    Years since quitting: 27.0  . Smokeless tobacco: Never Used  Substance Use Topics  . Alcohol use: No  . Drug use: No    Home Medications Prior to Admission medications   Medication Sig Start Date End Date Taking? Authorizing Provider  allopurinol (ZYLOPRIM) 100 MG tablet Take 1 tablet (100 mg total) by mouth daily. 06/03/19   McLean-Scocuzza, Nino Glow, MD  aspirin EC 81 MG tablet Take  81 mg daily by mouth.    [provider]  atorvastatin (LIPITOR) 20 MG tablet Take 20 mg by mouth daily at 6 PM.     [provider]  docusate sodium (COLACE) 100 MG capsule Take 200 mg by mouth daily as needed.     [provider]  gabapentin (NEURONTIN) 300 MG capsule Take 1 capsule (300 mg total) by mouth daily. At 3 PM 05/08/19   McLean-Scocuzza, Nino Glow, MD  ketoconazole (NIZORAL) 2 % shampoo Apply 1 application topically 2 (two) times a week. Patient taking differently: Apply 1 application topically as needed.  06/11/19   McLean-Scocuzza, Nino Glow, MD  levothyroxine (SYNTHROID) 150 MCG tablet Take 150 mcg by mouth daily before breakfast.    [provider]  lidocaine-prilocaine (EMLA) cream APPLY SMALL AMOUNT TO ACCESS SITE (AVF) 1 TO 2 HOURS BEFORE DIALYSIS. COVER WITH OCCLUSIVE DRESSING (SARAN WRAP) 04/08/19   [provider]  midodrine (PROAMATINE) 10 MG tablet Take 1 tablet (10 mg total) by mouth 3 (three) times a week. Take within 1/2 hour prior to dialysis start  09/16/19   Baldwin Jamaica, PA-C  multivitamin (RENA-VIT) TABS tablet Take 1 tablet daily by mouth.    [provider]  ondansetron (ZOFRAN) 4 MG tablet Take 4 mg by mouth every 8 (eight) hours as needed for nausea or vomiting.  01/19/19   [provider]  sevelamer carbonate (RENVELA) 2.4 g PACK Take by mouth 3 (three) times daily with meals. And with any snacks     [provider]  tamsulosin (FLOMAX) 0.4 MG CAPS capsule Take 0.4 mg by mouth daily after supper.     [provider]  Zinc Oxide (BALMEX EX) Apply 1 application topically daily as needed (to irritated areas of skin).     [provider]    Allergies    Ace inhibitors, Angiotensin receptor blockers, Ivp dye [iodinated diagnostic agents], and Adhesive [tape]  Review of Systems   Review of Systems  Unable to perform ROS: Acuity of condition   . Physical Exam Updated Vital Signs BP (!) 96/59 (BP Location: Right Arm)   Pulse 83   Temp (!) 97.2 F (36.2 C) (Oral)   Resp (!) 22   SpO2 96%   Physical Exam Vitals and nursing note reviewed.  Constitutional:      General: He is not in acute distress.    Appearance: He is well-developed. He is ill-appearing.     Comments: Chronically ill-appearing  HENT:     Head: Normocephalic and atraumatic.     Mouth/Throat:     Pharynx: No oropharyngeal exudate.  Eyes:     Conjunctiva/sclera: Conjunctivae normal.     Pupils: Pupils are equal, round, and reactive to light.  Neck:     Comments: No meningismus. Cardiovascular:     Rate and Rhythm: Normal rate and regular rhythm.     Heart sounds: Normal heart sounds. No murmur.  Pulmonary:     Effort: Pulmonary effort is normal. No respiratory distress.     Breath sounds: Normal breath sounds.  Abdominal:     Palpations: Abdomen is soft.     Tenderness: There is no abdominal tenderness. There is no guarding or rebound.  Musculoskeletal:        General: No tenderness. Normal range of  motion.     Cervical back: Normal range of motion and neck supple.     Comments: AVF LUE with intact thrill and bruit  Skin:  General: Skin is warm.  Neurological:     Mental Status: He is alert and oriented to person, place, and time.     Cranial Nerves: Cranial nerve deficit present.     Motor: Weakness present. No abnormal muscle tone.     Coordination: Coordination abnormal.     Comments: Patient is alert and oriented x3.  He is some stuttering speech with questionable dysarthria.  There is a left-sided facial droop and asymmetric smile.  Tongue is midline.  Able to hold arms off the bed bilaterally with some pronator drift on the left and decreased strength of the left grip strength.  Difficulty holding left leg off the bed.  Able to wiggle toes bilaterally.  Ankle flexion and extension intact bilaterally but weaker on the left.  Intact PT pulses with Doppler bilaterally  Psychiatric:        Behavior: Behavior normal.     ED Results / Procedures / Treatments   Labs (all labs ordered are listed, but only abnormal results are displayed) Labs Reviewed  CBG MONITORING, ED - Abnormal; Notable for the following components:      Result Value   Glucose-Capillary 154 (*)    All other components within normal limits  I-STAT CHEM 8, ED - Abnormal; Notable for the following components:   Sodium 134 (*)    Chloride 94 (*)    BUN 49 (*)    Creatinine, Ser 7.10 (*)    Glucose, Bld 158 (*)    Calcium, Ion 1.02 (*)    All other components within normal limits  RESPIRATORY PANEL BY RT PCR (FLU A&B, COVID)  ETHANOL  PROTIME-INR  APTT  CBC  DIFFERENTIAL  COMPREHENSIVE METABOLIC PANEL  RAPID URINE DRUG SCREEN, HOSP PERFORMED  URINALYSIS, ROUTINE W REFLEX MICROSCOPIC    EKG EKG Interpretation  Date/Time:  Wednesday October 07 2019 08:55:43 EST Ventricular Rate:  83 PR Interval:    QRS Duration: 101 QT Interval:  371 QTC Calculation: 436 R Axis:   -58 Text  Interpretation: Sinus or ectopic atrial rhythm Prolonged PR interval Left anterior fascicular block Anteroseptal infarct, old No significant change was found Confirmed by Ezequiel Essex [phone]) on 10/07/2019 9:05:08 AM   Radiology CT HEAD CODE STROKE WO CONTRAST  Result Date: 10/07/2019 CLINICAL DATA:  Code stroke. Focal neuro deficit, greater than 6 hours, stroke suspected, aphasia and facial droop. EXAM: CT HEAD WITHOUT CONTRAST TECHNIQUE: Contiguous axial images were obtained from the base of the skull through the vertex without intravenous contrast. COMPARISON:  No pertinent prior studies available for comparison. FINDINGS: Brain: No evidence of acute intracranial hemorrhage. No demarcated cortical infarction. No evidence of intracranial mass. No midline shift or extra-axial fluid collection. Mild generalized parenchymal atrophy. Vascular: No hyperdense vessel.  Atherosclerotic calcifications. Skull: Normal. Negative for fracture or focal lesion. Sinuses/Orbits: Visualized orbits demonstrate no acute abnormality. No significant paranasal sinus disease or mastoid effusion at the imaged levels. These results were communicated to Dr. Rory Percy at [place] 1/27/2021by text page via the [facility] messaging system. IMPRESSION: No CT evidence of acute intracranial abnormality. Mild generalized parenchymal atrophy. Electronically Signed   By: Kellie Simmering DO   On: 10/07/2019 09:16    Procedures .Critical Care Performed by: Ezequiel Essex, MD Authorized by: Ezequiel Essex, MD   Critical care provider statement:    Critical care time (minutes):  45   Critical care was necessary to treat or prevent imminent or life-threatening deterioration of the following conditions:  CNS failure or compromise  Critical care was time spent personally by me on the following activities:  Discussions with consultants, evaluation of patient's response to treatment, examination of patient, ordering and performing treatments  and interventions, ordering and review of laboratory studies, ordering and review of radiographic studies, pulse oximetry, re-evaluation of patient's condition, obtaining history from patient or surrogate and review of old charts   (including critical care time)  Medications Ordered in ED Medications - No data to display  ED Course  I have reviewed the triage vital signs and the nursing notes.  Pertinent labs & imaging results that were available during my care of the patient were reviewed by me and considered in my medical decision making (see chart for details).    MDM Rules/Calculators/A&P                     Patient with history of ESRD on dialysis, diabetes, first-degree AV block, presenting with weakness and inability to walk.  Last normal was approximately 3 PM yesterday.  He has a facial droop on exam with some left-sided weakness.  Code stroke was activated on initial assessment. Seen with Dr. Rory Percy and neurology team at bedside. CT head negative for hemorrhage.   CTA negative for large vessel occlusion.  Patient not TPA candidate due to delay in presentation.  No evidence of large vessel occlusion. Neurology recommends medical admission for toxic metabolic work-up and rule out stroke.  Patient complains of back pain which he states is chronic.  Did have a fall last week.  He is mildly hypotensive in the ED and did have dialysis yesterday.  His daughter states he has chronic hypotension and takes midodrine on dialysis days. CT abdomen pelvis in October 2020 did not show any AAA.  D/w Dr. Rory Percy of neurology.  He would like patient's blood pressure to be greater than [id number] systolic due to his neurological symptoms and carotid stenosis.  Patient has chronically low blood pressure and takes midodrine on dialysis days.  He is given some fluid as well as we will start on peripheral phenylephrine.  Discussed with hospitalist Dr. Tamala Julian.  He states with patient being on phenylephrine he will  need to go to the ICU. D/w NP Minor of ICU team. They will evaluate.  Frank [name] was evaluated in Emergency Department on 10/07/2019 for the symptoms described in the history of present illness. He was evaluated in the context of the global COVID-19 pandemic, which necessitated consideration that the patient might be at risk for infection with the SARS-CoV-2 virus that causes COVID-19. Institutional protocols and algorithms that pertain to the evaluation of patients at risk for COVID-19 are in a state of rapid change based on information released by regulatory bodies including the CDC and federal and state organizations. These policies and algorithms were followed during the patient's care in the ED.  Final Clinical Impression(s) / ED Diagnoses Final diagnoses:  Left-sided weakness  ESRD (end stage renal disease) [employer] [facility])    Rx / DC Orders ED Discharge Orders    None       Morganne Haile, Annie Main, MD 10/07/19 1900    Ezequiel Essex, MD 10/07/19 1901

## 2019-10-07 NOTE — Progress Notes (Signed)
  Echocardiogram 2D Echocardiogram has been performed.  Matilde Bash 10/07/2019, 1:40 PM

## 2019-10-07 NOTE — Evaluation (Signed)
Speech Language Pathology Evaluation Patient Details Name: Frank [name] MRN: [id number] DOB: [birth year] Today's Date: 10/07/2019 Time: [id number] SLP Time Calculation (min) (ACUTE ONLY): 30 min  Problem List:  Patient Active Problem List   Diagnosis Date Noted  . Slurred speech 10/07/2019  . Epiretinal membrane (ERM) of right eye 06/10/2019  . Elevated liver enzymes 06/10/2019  . Benign prostatic hyperplasia 05/08/2019  . Insomnia 05/08/2019  . Coronary artery disease involving native coronary artery of native heart without angina pectoris 05/08/2019  . Atherosclerosis 05/08/2019  . Hyperphosphatemia 05/08/2019  . Secondary hyperparathyroidism ([place])   . Hyperlipidemia   . Gout   . Anemia of chronic kidney failure   . Near syncope 01/21/2019  . Elevated troponin 01/21/2019  . Osteoarthritis of midfoot, right 08/13/2016  . Foot swelling 07/04/2016  . DM type 2 with diabetic peripheral neuropathy ([place]) 11/28/2015  . Pre-transplant evaluation for kidney transplant 11/28/2015  . HLD (hyperlipidemia) 08/01/2015  . 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 Chronic idiopathic gout of multiple sites 02/10/2015  . Illiterate 11/19/2013  . ESRD (end stage renal disease) on dialysis ([place]) 05/07/2013  . Essential hypertension 05/07/2013  . Osteoarthritis 05/07/2013  . Diastasis recti 10/29/2012  . 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 ED (erectile dysfunction) 08/28/2012  . Cataracts, bilateral 05/15/2012  . Chronic pain 05/15/2012  . DM (diabetes mellitus) ([place]) 05/15/2012  . Gout of knee 05/15/2012  . Hyperkalemia 05/15/2012  . Hypothyroidism 05/15/2012   Past Medical History:  Past Medical History:  Diagnosis Date  . Anemia of chronic kidney failure   . Arthritis   . Bradycardia   . Cataracts, bilateral 05/15/2012  . Chronic idiopathic gout of multiple sites 02/10/2015  .  Chronic kidney disease     Kidney  . Depression   . Diabetes mellitus without complication (HCC)    diet controlled   . Diabetic nephropathy ([place])   . ED (erectile dysfunction) 08/28/2012  . Elevated liver enzymes 06/10/2019  . Epiretinal membrane (ERM) of right eye 06/10/2019   Right eye saw AE 06/05/2019    . Essential hypertension 05/07/2013   Last Assessment & Plan:  Formatting of this note might be different from the original. HTN PLAN   BP goal is <140/90 BP Readings from Last 3 Encounters:  08/27/16 140/79  08/13/16 141/80  08/06/16 144/86   Current Anti-Hyptensive Medications: None  Today's Recommendations: Continue lifestyle modifications. Defer starting medication to PCP.  Recommend continued regular exercise as tolerated. Recomm  . History of UTI    s/p foley in hospital 01/2019   . Hyperlipidemia   . Hypothyroidism   . Illiterate 11/19/2013   Last Assessment & Plan:  Assisted patient with Handicapped Placard application today.    . Insomnia 05/08/2019  . Neuropathy   . Peripheral neuropathy   . Peripheral vascular disease ([place])   . Secondary hyperparathyroidism ([place])    Renal  . Thyroid disease    Past Surgical History:  Past Surgical History:  Procedure Laterality Date  . AV FISTULA PLACEMENT Left 11/19/2017   Procedure: ARTERIOVENOUS (AV) FISTULA CREATION;  Surgeon: [name], MD;  Location: [place];  Service: Vascular;  Laterality: Left;  . BASCILIC VEIN TRANSPOSITION Left 06/04/2018   Procedure: BASILIC VEIN TRANSPOSITION ARM;  Surgeon: [name], MD;  Location: [place];  Service: Vascular;  Laterality: Left;  . COLONOSCOPY    . DIALYSIS/PERMA CATHETER REMOVAL N/A 03/23/2019  Procedure: DIALYSIS/PERMA CATHETER REMOVAL;  Surgeon: [name], MD;  Location: [place] [facility];  Service: Cardiovascular;  Laterality: N/A;  . EYE SURGERY     cataract surgery b/l; photophobia since   . INSERTION OF DIALYSIS CATHETER N/A 01/23/2019    Procedure: INSERTION OF DIALYSIS CATHETER;  Surgeon: [name], MD;  [facility];  Service: Vascular;  Laterality: N/A;  INSERTION OF DIALYSIS CATHETER  . KNEE ARTHROSCOPY Left    Knee  . PERIPHERAL VASCULAR THROMBECTOMY Left 07/06/2019   Procedure: PERIPHERAL VASCULAR THROMBECTOMY;  Surgeon: [name], MD;  Location: [place] [facility];  Service: Cardiovascular;  Laterality: Left;  . THROMBECTOMY W/ EMBOLECTOMY Left 01/27/2019   Procedure: THROMBECTOMY Left arm ARTERIOVENOUS FISTULA  and Left arm Arteriovenous gortex graft;  Surgeon: [name], MD;  Location: [place];  Service: Vascular;  Laterality: Left;  Marland Kitchen VENOGRAM Left 01/27/2019   Procedure: Left arm FISTULOGRAM/;  Surgeon: [name], MD;  Location: [facility] OR;  Service: Vascular;  Laterality: Left;   HPI:  79yo male admitted 10/07/19 with weakness, facial droop, and dysarthria. PMH: ESRD, DM, HTN, HLD, neuropathy, PAD, 2nd hyperparathyroidism, ED, depression, gout.Marland Kitchen HeadCT = atrophy   Assessment / Plan / Recommendation Clinical Impression  The [place] Mental Status (SLUMS) Examination was administered. Pt has a 7th grade education, and lives alone. He has 4 adult children who share in caring for him. His son manages pt's finances. One daughter manages his groceries, and another manages his medications. Pt scored 17/30 on SLUMS, raising concern for neurocognitive decline. Points were lost on mental math, thought organization, delayed recall, digit reversal, clock drawing, and auditory attention and recall. Pt's daughter was outside his room following this evaluation, and was receptive to education regarding his test score. Acute ST services are not recommended at this time, however, follow up at next venue may be helpful given level of independence prior to admit. MRI is pending. Head CT reveals atrophy. This in addition to limited education may mean pt is at or close to baseline level of function.    SLP  Assessment  SLP Recommendation/Assessment: All further Speech Language Pathology needs can be addressed in the next venue of care  SLP Visit Diagnosis: Cognitive communication deficit (R41.841)    Follow Up Recommendations  24 hour supervision/assistance       SLP Evaluation Cognition  Arousal/Alertness: Awake/alert Orientation Level: Oriented X4 Attention: Focused;Sustained Focused Attention: Appears intact Sustained Attention: Impaired Sustained Attention Impairment: Verbal basic Memory: Impaired Memory Impairment: Storage deficit;Retrieval deficit;Decreased recall of new information;Decreased short term memory Comments: 17/30 SLUMS       Comprehension  Auditory Comprehension Overall Auditory Comprehension: Appears within functional limits for tasks assessed    Expression Expression Primary Mode of Expression: Verbal Verbal Expression Overall Verbal Expression: Appears within functional limits for tasks assessed   Oral / Motor  Oral Motor/Sensory Function Overall Oral Motor/Sensory Function: Within functional limits Motor Speech Overall Motor Speech: Appears within functional limits for tasks assessed   GO                   Dalicia Kisner B. Quentin Ore, [facility], [place] Speech Language Pathologist Office: [phone] Pager: [phone]  Shonna Chock 10/07/2019, 4:23 PM

## 2019-10-08 ENCOUNTER — Inpatient Hospital Stay (HOSPITAL_COMMUNITY): Payer: Medicare Other

## 2019-10-08 ENCOUNTER — Encounter (HOSPITAL_COMMUNITY): Payer: Self-pay | Admitting: Pulmonary Disease

## 2019-10-08 DIAGNOSIS — I739 Peripheral vascular disease, unspecified: Secondary | ICD-10-CM

## 2019-10-08 DIAGNOSIS — I950 Idiopathic hypotension: Secondary | ICD-10-CM

## 2019-10-08 DIAGNOSIS — R579 Shock, unspecified: Secondary | ICD-10-CM

## 2019-10-08 DIAGNOSIS — I6340 Cerebral infarction due to embolism of unspecified cerebral artery: Secondary | ICD-10-CM | POA: Insufficient documentation

## 2019-10-08 HISTORY — PX: IR FLUORO GUIDE CV LINE RIGHT: IMG2283

## 2019-10-08 HISTORY — PX: IR US GUIDE VASC ACCESS RIGHT: IMG2390

## 2019-10-08 LAB — CBC
HCT: 37.3 % — ABNORMAL LOW (ref 39.0–52.0)
Hemoglobin: 12.1 g/dL — ABNORMAL LOW (ref 13.0–17.0)
MCH: 33 pg (ref 26.0–34.0)
MCHC: 32.4 g/dL (ref 30.0–36.0)
MCV: 101.6 fL — ABNORMAL HIGH (ref 80.0–100.0)
Platelets: 204 10*3/uL (ref 150–400)
RBC: 3.67 MIL/uL — ABNORMAL LOW (ref 4.22–5.81)
RDW: 13.1 % (ref 11.5–15.5)
WBC: 9.2 10*3/uL (ref 4.0–10.5)
nRBC: 0 % (ref 0.0–0.2)

## 2019-10-08 LAB — RENAL FUNCTION PANEL
Albumin: 2.6 g/dL — ABNORMAL LOW (ref 3.5–5.0)
Anion gap: 13 (ref 5–15)
BUN: 67 mg/dL — ABNORMAL HIGH (ref 8–23)
CO2: 24 mmol/L (ref 22–32)
Calcium: 8.6 mg/dL — ABNORMAL LOW (ref 8.9–10.3)
Chloride: 102 mmol/L (ref 98–111)
Creatinine, Ser: 8.24 mg/dL — ABNORMAL HIGH (ref 0.61–1.24)
GFR calc Af Amer: 7 mL/min — ABNORMAL LOW (ref >60)
GFR calc non Af Amer: 6 mL/min — ABNORMAL LOW (ref >60)
Glucose, Bld: 161 mg/dL — ABNORMAL HIGH (ref 70–99)
Phosphorus: 8.5 mg/dL — ABNORMAL HIGH (ref 2.5–4.6)
Potassium: 4.4 mmol/L (ref 3.5–5.1)
Sodium: 139 mmol/L (ref 135–145)

## 2019-10-08 LAB — URINE CULTURE: Culture: NO GROWTH

## 2019-10-08 LAB — LIPID PANEL
Cholesterol: 92 mg/dL (ref 0–200)
HDL: 34 mg/dL — ABNORMAL LOW (ref >40)
LDL Cholesterol: 35 mg/dL (ref 0–99)
Total CHOL/HDL Ratio: 2.7 RATIO
Triglycerides: 117 mg/dL (ref <150)
VLDL: 23 mg/dL (ref 0–40)

## 2019-10-08 LAB — GLUCOSE, CAPILLARY
Glucose-Capillary: 152 mg/dL — ABNORMAL HIGH (ref 70–99)
Glucose-Capillary: 140 mg/dL — ABNORMAL HIGH (ref 70–99)
Glucose-Capillary: 174 mg/dL — ABNORMAL HIGH (ref 70–99)

## 2019-10-08 LAB — HEMOGLOBIN A1C
Hgb A1c MFr Bld: 6.7 % — ABNORMAL HIGH (ref 4.8–5.6)
Mean Plasma Glucose: 145.59 mg/dL

## 2019-10-08 MED ORDER — ASPIRIN EC 325 MG PO TBEC
325.0000 mg | DELAYED_RELEASE_TABLET | Freq: Every day | ORAL | Status: DC
  Administered 2019-10-08 – 2019-10-09 (×2): 325 mg via ORAL
  Filled 2019-10-08 (×2): qty 1

## 2019-10-08 MED ORDER — LEVOTHYROXINE SODIUM 75 MCG PO TABS
175.0000 ug | ORAL_TABLET | Freq: Every day | ORAL | Status: DC
  Administered 2019-10-08 – 2019-10-24 (×13): 175 ug via ORAL
  Filled 2019-10-08 (×14): qty 1

## 2019-10-08 MED ORDER — LIDOCAINE HCL 1 % IJ SOLN
INTRAMUSCULAR | Status: AC
  Filled 2019-10-08: qty 20

## 2019-10-08 MED ORDER — HEPARIN SODIUM (PORCINE) 1000 UNIT/ML IJ SOLN
INTRAMUSCULAR | Status: AC
  Filled 2019-10-08: qty 1

## 2019-10-08 MED ORDER — CLOPIDOGREL BISULFATE 75 MG PO TABS
75.0000 mg | ORAL_TABLET | Freq: Every day | ORAL | Status: DC
  Administered 2019-10-08 – 2019-10-09 (×2): 75 mg via ORAL
  Filled 2019-10-08 (×2): qty 1

## 2019-10-08 MED ORDER — PREDNISONE 20 MG PO TABS: 50.0000 mg | ORAL_TABLET | Freq: Once | ORAL | Status: DC

## 2019-10-08 MED ORDER — HEPARIN SODIUM (PORCINE) 5000 UNIT/ML IJ SOLN
5000.0000 [IU] | Freq: Three times a day (TID) | INTRAMUSCULAR | Status: AC
  Administered 2019-10-10 – 2019-10-13 (×10): 5000 [IU] via SUBCUTANEOUS
  Filled 2019-10-08 (×11): qty 1

## 2019-10-08 MED ORDER — ATORVASTATIN CALCIUM 10 MG PO TABS
20.0000 mg | ORAL_TABLET | Freq: Every day | ORAL | Status: DC
  Administered 2019-10-08 – 2019-10-24 (×17): 20 mg via ORAL
  Filled 2019-10-08 (×18): qty 2

## 2019-10-08 MED ORDER — LIDOCAINE HCL 1 % IJ SOLN
INTRAMUSCULAR | Status: DC | PRN
  Administered 2019-10-08: 8 mL

## 2019-10-08 MED ORDER — HEPARIN SODIUM (PORCINE) 1000 UNIT/ML IJ SOLN
INTRAMUSCULAR | Status: DC | PRN
  Administered 2019-10-08: 2600 [IU] via INTRAVENOUS

## 2019-10-08 MED ORDER — DIPHENHYDRAMINE HCL 25 MG PO CAPS: 50.0000 mg | ORAL_CAPSULE | Freq: Once | ORAL | Status: DC

## 2019-10-08 MED ORDER — MIDODRINE HCL 5 MG PO TABS
10.0000 mg | ORAL_TABLET | Freq: Three times a day (TID) | ORAL | Status: DC
  Administered 2019-10-08 – 2019-10-18 (×24): 10 mg via ORAL
  Filled 2019-10-08 (×25): qty 2

## 2019-10-08 NOTE — Progress Notes (Cosign Needed Addendum)
[facility] Progress Note   Subjective:   Patient seen and examined at bedside.  No acute events overnight.  Complains of feeling itchy and needing a bath.  No other complaints.  No bruit or thrill noted on PE of LU AVG.  Patient reports occasionally they have to pull out clots prior to running him at OP dialysis.    Objective Vitals:   10/08/19 0930 10/08/19 0945 10/08/19 1000 10/08/19 1015  BP: (!) 104/48 (!) 148/71 (!) 132/45 (!) 140/50  Pulse: (!) 59 63 63 70  Resp: 19 (!) 22 20 (!) 32  Temp:      TempSrc:      SpO2: 95% 96% 99% 98%  Weight:      Height:       Physical Exam General:NAD, chronically ill appearing male, laying in bed Heart:RRR Lungs:CTAB Abdomen:soft, NTND Extremities:no LE edema, +chronic skin changes Dialysis Access: LU AVG - no bruit or thrill   Filed Weights   10/07/19 1230  Weight: 92.7 kg    Intake/Output Summary (Last 24 hours) at 10/08/2019 1026 Last data filed at 10/08/2019 1000 Gross per 24 hour  Intake 4851.37 ml  Output 0 ml  Net 4851.37 ml    Additional Objective Labs: Basic Metabolic Panel: Recent Labs  Lab 10/07/19 0858 10/07/19 0902 10/08/19 0316  NA 137 134* 139  K 4.6 4.4 4.4  CL 92* 94* 102  CO2 27  --  24  GLUCOSE 163* 158* 161*  BUN 54* 49* 67*  CREATININE 7.02* 7.10* 8.24*  CALCIUM 9.5  --  8.6*  PHOS  --   --  8.5*   Liver Function Tests: Recent Labs  Lab 10/07/19 0858 10/08/19 0316  AST 40  --   ALT 24  --   ALKPHOS 99  --   BILITOT 0.4  --   PROT 7.3  --   ALBUMIN 3.5 2.6*   CBC: Recent Labs  Lab 10/07/19 0858 10/07/19 0902 10/08/19 0316  WBC 10.8*  --  9.2  NEUTROABS 8.6*  --   --   HGB 13.4 13.9 12.1*  HCT 42.5 41.0 37.3*  MCV 102.7*  --  101.6*  PLT 209  --  204   CBG: Recent Labs  Lab 10/07/19 1558 10/07/19 2042 10/07/19 2317 10/08/19 0318 10/08/19 0747  GLUCAP 92 150* 137* 152* 140*    Lab Results  Component Value Date   INR 1.0 10/07/2019   INR 1.1 01/27/2019    Studies/Results: CT ANGIO HEAD W OR WO CONTRAST  Result Date: 10/07/2019 CLINICAL DATA:  Code stroke, follow-up EXAM: CT ANGIOGRAPHY HEAD AND NECK CT PERFUSION BRAIN TECHNIQUE: Multidetector CT imaging of the head and neck was performed using the standard protocol during bolus administration of intravenous contrast. Multiplanar CT image reconstructions and MIPs were obtained to evaluate the vascular anatomy. Carotid stenosis measurements (when applicable) are obtained utilizing NASCET criteria, using the distal internal carotid diameter as the denominator. Multiphase CT imaging of the brain was performed following IV bolus contrast injection. Subsequent parametric perfusion maps were calculated using RAPID software. CONTRAST:  166mL OMNIPAQUE IOHEXOL 350 MG/ML SOLN COMPARISON:  None. FINDINGS: CTA NECK FINDINGS Aortic arch: Great vessel origins are patent. There is right origin of the left vertebral artery from the arch, with calcified plaque noted at the origin. Right carotid system: Patent. There is calcified and noncalcified plaque along the proximal ICA causing 75% stenosis. Left carotid system: Patent. There is calcified plaque at the ICA origin causing minimal  stenosis. Vertebral arteries: Patent. Calcified plaque at both vertebral artery origins without evidence of flow limitation. Right vertebral artery is dominant. Skeleton: Degenerative changes. Other neck: No mass or adenopathy. Upper chest: No apical lung mass. Review of the MIP images confirms the above findings CTA HEAD FINDINGS Anterior circulation: Intracranial internal carotid arteries are patent with calcified plaque causing mild stenosis. Anterior and middle cerebral arteries are patent. Posterior circulation: Intracranial vertebral arteries are patent with calcified plaque causing moderate stenosis on the left. Basilar artery is patent. Posterior cerebral arteries are patent with bilateral posterior communicating arteries present. Venous  sinuses: As permitted by contrast timing, patent. Review of the MIP images confirms the above findings CT Brain Perfusion Findings: CBF (<30%) Volume: 25mL Perfusion (Tmax>6.0s) volume: 13mL Mismatch Volume: 36mL Infarction Location: None IMPRESSION: No large vessel occlusion. No evidence of core infarction or territory at risk by perfusion imaging. Plaque at the proximal right ICA causes 75% stenosis. There is minimal stenosis of the left ICA origin. Electronically Signed   By: Macy Mis M.D.   On: 10/07/2019 09:41   CT ANGIO NECK W OR WO CONTRAST  Result Date: 10/07/2019 CLINICAL DATA:  Code stroke, follow-up EXAM: CT ANGIOGRAPHY HEAD AND NECK CT PERFUSION BRAIN TECHNIQUE: Multidetector CT imaging of the head and neck was performed using the standard protocol during bolus administration of intravenous contrast. Multiplanar CT image reconstructions and MIPs were obtained to evaluate the vascular anatomy. Carotid stenosis measurements (when applicable) are obtained utilizing NASCET criteria, using the distal internal carotid diameter as the denominator. Multiphase CT imaging of the brain was performed following IV bolus contrast injection. Subsequent parametric perfusion maps were calculated using RAPID software. CONTRAST:  119mL OMNIPAQUE IOHEXOL 350 MG/ML SOLN COMPARISON:  None. FINDINGS: CTA NECK FINDINGS Aortic arch: Great vessel origins are patent. There is right origin of the left vertebral artery from the arch, with calcified plaque noted at the origin. Right carotid system: Patent. There is calcified and noncalcified plaque along the proximal ICA causing 75% stenosis. Left carotid system: Patent. There is calcified plaque at the ICA origin causing minimal stenosis. Vertebral arteries: Patent. Calcified plaque at both vertebral artery origins without evidence of flow limitation. Right vertebral artery is dominant. Skeleton: Degenerative changes. Other neck: No mass or adenopathy. Upper chest: No apical  lung mass. Review of the MIP images confirms the above findings CTA HEAD FINDINGS Anterior circulation: Intracranial internal carotid arteries are patent with calcified plaque causing mild stenosis. Anterior and middle cerebral arteries are patent. Posterior circulation: Intracranial vertebral arteries are patent with calcified plaque causing moderate stenosis on the left. Basilar artery is patent. Posterior cerebral arteries are patent with bilateral posterior communicating arteries present. Venous sinuses: As permitted by contrast timing, patent. Review of the MIP images confirms the above findings CT Brain Perfusion Findings: CBF (<30%) Volume: 45mL Perfusion (Tmax>6.0s) volume: 46mL Mismatch Volume: 86mL Infarction Location: None IMPRESSION: No large vessel occlusion. No evidence of core infarction or territory at risk by perfusion imaging. Plaque at the proximal right ICA causes 75% stenosis. There is minimal stenosis of the left ICA origin. Electronically Signed   By: Macy Mis M.D.   On: 10/07/2019 09:41   MR BRAIN WO CONTRAST  Result Date: 10/07/2019 CLINICAL DATA:  Initial evaluation for acute weakness, facial droop, slurred speech. Concern for stroke. EXAM: MRI HEAD WITHOUT CONTRAST TECHNIQUE: Multiplanar, multiecho pulse sequences of the brain and surrounding structures were obtained without intravenous contrast. COMPARISON:  Prior CTs from earlier the same day.  FINDINGS: Brain: Moderately advanced age-related cerebral atrophy. Patchy T2/FLAIR hyperintensity within the periventricular white matter most consistent with chronic small vessel ischemic disease, mild in nature. Multifocal areas of restricted diffusion seen involving the periventricular/periatrial white matter of both cerebral hemispheres, consistent with acute ischemic small vessel type infarcts. Overall, infarct burden slightly worse on the right as compared to the left. For reference purposes, largest area of ischemia measures  approximately 12 mm at the posterior right frontal periventricular white matter. No associated hemorrhage or mass effect. No acute cortically based infarct. Gray-white matter differentiation otherwise maintained. No areas of remote cortical infarction. No acute intracranial hemorrhage. Single punctate chronic microhemorrhage noted at the right external capsule, likely related to small vessel disease. No other evidence for chronic intracranial hemorrhage. No mass lesion, midline shift or mass effect. No hydrocephalus. No extra-axial fluid collection. Incidental note made of a partially empty sella. Midline structures intact. Vascular: Major intracranial vascular flow voids are maintained. Skull and upper cervical spine: Craniocervical junction within normal limits. Bone marrow signal intensity normal. No scalp soft tissue abnormality. Sinuses/Orbits: Patient status post bilateral ocular lens replacement. Mild scattered mucosal thickening noted within the ethmoidal air cells. Paranasal sinuses are otherwise clear. No mastoid effusion. Inner ear structures grossly normal. Other: None. IMPRESSION: 1. 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 No associated hemorrhage or mass effect. 2. Underlying moderately advanced age-related cerebral atrophy with mild chronic small vessel ischemic disease. Electronically Signed   By: Jeannine Boga M.D.   On: 10/07/2019 20:34   DG Chest Portable 1 View  Result Date: 10/07/2019 CLINICAL DATA:  Pt from home with daughter LSN yesterday at 1500. Daughter saw pt this morning slumped over in a chair to the right side. Pt has been increasingly weak over the past week. EXAM: PORTABLE CHEST 1 VIEW COMPARISON:  01/23/2019 FINDINGS: There are low lung volumes. Mild linear opacity noted at the right lung base consistent with atelectasis. Lungs otherwise clear. No  pleural effusion or pneumothorax. Cardiac silhouette is normal in size. No mediastinal or hilar masses. Skeletal structures are grossly intact. IMPRESSION: No acute cardiopulmonary disease. Electronically Signed   By: Lajean Manes M.D.   On: 10/07/2019 09:57   ECHOCARDIOGRAM COMPLETE  Result Date: 10/07/2019   ECHOCARDIOGRAM REPORT   Patient Name:   Rhyan KAITO DIMLER Date of Exam: 10/07/2019 Medical Rec #:  [id number]       Height:       67.0 in Accession #:    [id number]      Weight:       204.4 lb Date of Birth:  01-10-1941       BSA:          2.04 m Patient Age:    29 years        BP:           129/96 mmHg Patient Gender: M               HR:           77 bpm. Exam Location:  Inpatient Procedure: 2D Echo, Cardiac Doppler and Color Doppler Indications:    CVA  History:        Patient has prior history of Echocardiogram examinations, most                 recent 01/22/2019. Stroke and PAD; Risk Factors:Hypertension,  Current Smoker and Diabetes. ESRD.  Sonographer:    Dustin Flock Referring Phys: [place]  1. Left ventricular ejection fraction, by visual estimation, is 60 to 65%. The left ventricle has normal function. There is moderately increased left ventricular hypertrophy.  2. Left ventricular diastolic parameters are indeterminate.  3. The left ventricle has no regional wall motion abnormalities.  4. Global right ventricle has mildly reduced systolic function.The right ventricular size is normal. No increase in right ventricular wall thickness.  5. Left atrial size was normal.  6. Right atrial size was normal.  7. The mitral valve is normal in structure. Trivial mitral valve regurgitation.  8. The tricuspid valve is normal in structure.  9. The tricuspid valve is normal in structure. Tricuspid valve regurgitation is trivial. 10. The aortic valve is tricuspid. Aortic valve regurgitation is not visualized. Mild to moderate aortic valve sclerosis/calcification without any  evidence of aortic stenosis. 11. The pulmonic valve was grossly normal. Pulmonic valve regurgitation is not visualized. 12. Moderately elevated pulmonary artery systolic pressure. 13. No intracardiac source of embolism detected on this transthoracic study. A transesophageal echocardiogram is recommended to exclude cardiac source of embolism if clinically indicated. 14. The atrial septum is grossly normal. FINDINGS  Left Ventricle: Left ventricular ejection fraction, by visual estimation, is 60 to 65%. The left ventricle has normal function. The left ventricle has no regional wall motion abnormalities. There is moderately increased left ventricular hypertrophy. Concentric left ventricular hypertrophy. Left ventricular diastolic parameters are indeterminate. Right Ventricle: The right ventricular size is normal. No increase in right ventricular wall thickness. Global RV systolic function is has mildly reduced systolic function. The tricuspid regurgitant velocity is 3.49 m/s, and with an assumed right atrial pressure of 8 mmHg, the estimated right ventricular systolic pressure is moderately elevated at 56.7 mmHg. Left Atrium: Left atrial size was normal in size. Right Atrium: Right atrial size was normal in size Pericardium: There is no evidence of pericardial effusion. Mitral Valve: The mitral valve is normal in structure. There is mild thickening of the mitral valve leaflet(s). There is mild calcification of the mitral valve leaflet(s). Trivial mitral valve regurgitation. Tricuspid Valve: The tricuspid valve is normal in structure. Tricuspid valve regurgitation is trivial. Aortic Valve: The aortic valve is tricuspid. . There is mild thickening and moderate calcification of the aortic valve. Aortic valve regurgitation is not visualized. Mild to moderate aortic valve sclerosis/calcification is present, without any evidence of aortic stenosis. There is mild thickening of the aortic valve. There is moderate calcification  of the aortic valve. Pulmonic Valve: The pulmonic valve was grossly normal. Pulmonic valve regurgitation is not visualized. Pulmonic regurgitation is not visualized. Aorta: The aortic root and ascending aorta are structurally normal, with no evidence of dilitation. Pulmonary Artery: The pulmonary artery is not well seen. Venous: The inferior vena cava was not well visualized. IAS/Shunts: The atrial septum is grossly normal. Additional Comments: No intracardiac source of embolism detected on this transthoracic study. A transesophageal echocardiogram is recommended to exclude cardiac source of embolism if clinically indicated.  LEFT VENTRICLE PLAX 2D LVIDd:         3.93 cm  Diastology LVIDs:         2.59 cm  LV e' lateral:   18.40 cm/s LV PW:         1.51 cm  LV E/e' lateral: 3.5 LV IVS:        1.69 cm  LV e' medial:    14.50 cm/s LVOT  diam:     2.50 cm  LV E/e' medial:  4.4 LV SV:         43 ml LV SV Index:   20.12 LVOT Area:     4.91 cm  RIGHT VENTRICLE RV Basal diam:  3.00 cm RV S prime:     9.90 cm/s TAPSE (M-mode): 1.1 cm LEFT ATRIUM             Index       RIGHT ATRIUM           Index LA diam:        4.30 cm 2.11 cm/m  RA Area:     18.70 cm LA Vol (A2C):   37.0 ml 18.13 ml/m RA Volume:   46.30 ml  22.69 ml/m LA Vol (A4C):   52.0 ml 25.48 ml/m LA Biplane Vol: 44.3 ml 21.71 ml/m  AORTIC VALVE LVOT Vmax:   84.90 cm/s LVOT Vmean:  57.100 cm/s LVOT VTI:    0.158 m  AORTA Ao Root diam: 3.50 cm MITRAL VALVE                        TRICUSPID VALVE MV Area (PHT): 3.54 cm             TR Peak grad:   48.7 mmHg MV PHT:        62.06 msec           TR Vmax:        349.00 cm/s MV Decel Time: 214 msec MV E velocity: 63.70 cm/s 103 cm/s  SHUNTS MV A velocity: 90.00 cm/s 70.3 cm/s Systemic VTI:  0.16 m MV E/A ratio:  0.71       1.5       Systemic Diam: 2.50 cm  Buford Dresser MD Electronically signed by Buford Dresser MD Signature Date/Time: 10/07/2019/3:31:07 PM    Final    CT HEAD CODE STROKE WO  CONTRAST  Result Date: 10/07/2019 CLINICAL DATA:  Code stroke. Focal neuro deficit, greater than 6 hours, stroke suspected, aphasia and facial droop. EXAM: CT HEAD WITHOUT CONTRAST TECHNIQUE: Contiguous axial images were obtained from the base of the skull through the vertex without intravenous contrast. COMPARISON:  No pertinent prior studies available for comparison. FINDINGS: Brain: No evidence of acute intracranial hemorrhage. No demarcated cortical infarction. No evidence of intracranial mass. No midline shift or extra-axial fluid collection. Mild generalized parenchymal atrophy. Vascular: No hyperdense vessel.  Atherosclerotic calcifications. Skull: Normal. Negative for fracture or focal lesion. Sinuses/Orbits: Visualized orbits demonstrate no acute abnormality. No significant paranasal sinus disease or mastoid effusion at the imaged levels. These results were communicated to Dr. Rory Percy at [place] 1/27/2021by text page via the [facility] messaging system. IMPRESSION: No CT evidence of acute intracranial abnormality. Mild generalized parenchymal atrophy. Electronically Signed   By: Kellie Simmering DO   On: 10/07/2019 09:16   CT CEREBRAL PERFUSION W CM (CHARM STUDY)  Result Date: 10/07/2019 CLINICAL DATA:  Code stroke, follow-up EXAM: CT ANGIOGRAPHY HEAD AND NECK CT PERFUSION BRAIN TECHNIQUE: Multidetector CT imaging of the head and neck was performed using the standard protocol during bolus administration of intravenous contrast. Multiplanar CT image reconstructions and MIPs were obtained to evaluate the vascular anatomy. Carotid stenosis measurements (when applicable) are obtained utilizing NASCET criteria, using the distal internal carotid diameter as the denominator. Multiphase CT imaging of the brain was performed following IV bolus contrast injection. Subsequent parametric perfusion maps were calculated using RAPID  software. CONTRAST:  168mL OMNIPAQUE IOHEXOL 350 MG/ML SOLN COMPARISON:  None. FINDINGS: CTA  NECK FINDINGS Aortic arch: Great vessel origins are patent. There is right origin of the left vertebral artery from the arch, with calcified plaque noted at the origin. Right carotid system: Patent. There is calcified and noncalcified plaque along the proximal ICA causing 75% stenosis. Left carotid system: Patent. There is calcified plaque at the ICA origin causing minimal stenosis. Vertebral arteries: Patent. Calcified plaque at both vertebral artery origins without evidence of flow limitation. Right vertebral artery is dominant. Skeleton: Degenerative changes. Other neck: No mass or adenopathy. Upper chest: No apical lung mass. Review of the MIP images confirms the above findings CTA HEAD FINDINGS Anterior circulation: Intracranial internal carotid arteries are patent with calcified plaque causing mild stenosis. Anterior and middle cerebral arteries are patent. Posterior circulation: Intracranial vertebral arteries are patent with calcified plaque causing moderate stenosis on the left. Basilar artery is patent. Posterior cerebral arteries are patent with bilateral posterior communicating arteries present. Venous sinuses: As permitted by contrast timing, patent. Review of the MIP images confirms the above findings CT Brain Perfusion Findings: CBF (<30%) Volume: 42mL Perfusion (Tmax>6.0s) volume: 57mL Mismatch Volume: 63mL Infarction Location: None IMPRESSION: No large vessel occlusion. No evidence of core infarction or territory at risk by perfusion imaging. Plaque at the proximal right ICA causes 75% stenosis. There is minimal stenosis of the left ICA origin. Electronically Signed   By: Macy Mis M.D.   On: 10/07/2019 09:41    Medications: . sodium chloride 250 mL (10/07/19 1134)  . sodium chloride    . phenylephrine (NEO-SYNEPHRINE) Adult infusion 100 mcg/min (10/08/19 1000)   . aspirin EC  325 mg Oral Daily  . atorvastatin  20 mg Oral q1800  . Chlorhexidine Gluconate Cloth  6 each Topical Daily  .  Chlorhexidine Gluconate Cloth  6 each Topical Q0600  . clopidogrel  75 mg Oral Daily  . heparin  5,000 Units Subcutaneous Q8H  . levothyroxine  175 mcg Oral QAC breakfast    Dialysis Orders: TTS -  [facility]  4hrs, BFR 400, DFR 800,  EDW 92kg, 2K/ 2.25Ca  Access: LU AVG  Heparin 5000 Calcitriol 0.5 mcg PO qHD   Assessment/Plan:    1. Clotted dialysis access - AVG appears clotted.  IR consulted for thrombectomy.  Appreciate IR assistance. Requesting temp cath insertion.   2. Lt sided weakness w/slurred speech/facial droop - Concern for stroke, Rt ICA stenosis noted on CT angio neck.  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 Per neuro watershed infarcts related to carotid stenosis.  3.  ESRD -  On HD TTS. K 4.4. Plan for HD today after temp cath placement. 4.  Hypotension/volume  - Blood pressure soft, on midodrine as OP. Currently on phenylephrine. Does not appear volume overloaded.  Close to dry if weights correct.  Minimal UF with HD. 5.  Anemia of CKD - Hgb 12.1.  No indication for ESA at this time.  6.  Secondary Hyperparathyroidism -  Ca and phos at goal.  Continue binders and VDRA.  7.  Nutrition - Renal diet w/fluid restrictions once advanced. Vit 8. DM - per primary 9. Hypothyroidism 10. Dysphagia - Speech assess  Jen Mow, PA-C [facility] Pager: [phone] 10/08/2019,10:26 AM  LOS: 1 day

## 2019-10-08 NOTE — Procedures (Signed)
Interventional Radiology Procedure:   Indications: ESRD with clotted left arm access.  Need hemodialysis prior to declot procedure.  Procedure: Non-tunneled HD catheter placement  Findings: Right jugular 16 cm catheter, tip at SVC/RA junction  Complications: None     EBL: less than 10 ml  Plan: Catheter is ready to use.     Sayla Golonka R. Anselm Pancoast, MD  Pager: [phone]

## 2019-10-08 NOTE — Progress Notes (Addendum)
[facility] Progress Note  Subjective: in good spirits  Vitals:   10/08/19 1130 10/08/19 1145 10/08/19 1200 10/08/19 1215  BP: 120/67 (!) 128/56 133/65   Pulse: 70 66 72 68  Resp: (!) 22 20 14  (!) 23  Temp:      TempSrc:      SpO2: 97% 95% (!) 89% 93%  Weight:      Height:        Exam: : WDWN, NAD, chronically ill appearing male Head: NCAT sclera not icteric  Neck: Supple. No lymphadenopathy Lungs: mostly CTA bilaterally, scattered rhonchi. No wheezes or rales. Breathing is unlabored. Heart: RRR. No murmur, rubs or gallops.  Abdomen: soft, nontender, +BS, no guarding, no rebound tenderness Lower extremities:no edema, +chronic ischemic changes b/l Neuro: AAOx3. Moves all extremities spontaneously. Psych:  Responds to questions appropriately with a normal affect. Dialysis Access: LU AVG no bruit today  Dialysis Orders:  TTS - [facility]  4hrs, BFR 400, DFR 800,  EDW 92kg, 2K/ 2.25Ca  Access: LU AVG  Heparin 5000 Calcitriol 0.5 mcg PO qHD   Assessment/Plan: 1.  Lt sided weakness w/slurred speech/facial droop - Concern for stroke, Rt ICA stenosis noted on CT angio neck.  MRI brain, ECHO, lipid panel and A1C ordered. Per neuro 2.  ESRD - HD TTS. HD today in ICU, consulted IR for prob temp cath today and then AVG declot whenever can be attempted. Appreciate assistance.  3.  Hypotension/volume  - Blood pressure soft, on midodrine as OP. Does not appear volume overloaded.  At dry wt. No UF w/ HD today.  4.  Anemia of CKD - Hgb 13.9.  No indication for ESA at this time.  5.  Secondary Hyperparathyroidism -  Ca and phos at goal.  Continue binders and VDRA.  6.  Nutrition - Renal diet w/fluid restrictions once advanced. Vit 7. DM - per primary 8. Hypothyroidism 9. Dysphagia - speech to assess    Frank [name] 10/08/2019, 12:21 PM  Inpatient medications: . aspirin EC  325 mg Oral Daily  . atorvastatin  20 mg Oral q1800  . Chlorhexidine Gluconate Cloth   6 each Topical Daily  . Chlorhexidine Gluconate Cloth  6 each Topical Q0600  . clopidogrel  75 mg Oral Daily  . heparin  5,000 Units Subcutaneous Q8H  . levothyroxine  175 mcg Oral QAC breakfast  . midodrine  10 mg Oral TID WC   . sodium chloride 250 mL (10/07/19 1134)  . sodium chloride    . phenylephrine (NEO-SYNEPHRINE) Adult infusion 50 mcg/min (10/08/19 1211)   acetaminophen **OR** acetaminophen (TYLENOL) oral liquid 160 mg/5 mL **OR** acetaminophen Recent Labs  Lab 10/07/19 0858 10/07/19 0858 10/07/19 0902 10/08/19 0316  NA 137   < > 134* 139  K 4.6   < > 4.4 4.4  CL 92*   < > 94* 102  CO2 27  --   --  24  GLUCOSE 163*   < > 158* 161*  BUN 54*   < > 49* 67*  CREATININE 7.02*   < > 7.10* 8.24*  CALCIUM 9.5  --   --  8.6*  PHOS  --   --   --  8.5*   < > = values in this interval not displayed.

## 2019-10-08 NOTE — H&P (Signed)
NAME:  Frank [name], MRN:  [id number], DOB:  [birth year], LOS: 1 ADMISSION DATE:  10/07/2019, CONSULTATION DATE:  10/07/19 REFERRING MD:  Dr. Rory Percy, Neuro, CHIEF COMPLAINT:  Facial droop   Brief History   79 yo male former smoker presented to ER with weakness, facial droop and slurred speech with concern for CVA.  Seen by neurology.  Outside time window for thrombolytic therapy.  Noted to have low blood pressure and started on phenylephrine.  PCCM asked to admit to ICU.  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 On neo 110mcg. SBP 140 on neo. 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 Has bialteral watershed infarcts. On TTS HD normally  Baseline: uses cane and drives. Per Renal - no thrill, no bruit on his graft.   Objective   Blood pressure (!) 132/45, pulse 63, temperature (!) 97.4 F (36.3 C), temperature source Oral, resp. rate 20, height 5\' 7"  (1.702 m), weight 92.7 kg, SpO2 99 %.        Intake/Output Summary (Last 24 hours) at 10/08/2019 1012 Last data filed at 10/08/2019 1000 Gross per 24 hour  Intake 4851.37 ml  Output 0 ml  Net 4851.37 ml   Filed Weights   10/07/19 1230  Weight: 92.7 kg    Examination: General Appearance:  Looks stable. Lying on 1 LNC. Comfortable Head:  Normocephalic, without obvious abnormality, atraumatic Eyes:  PERRL - yes, conjunctiva/corneas - muddy     Ears:  Normal external ear canals, both ears Nose:  G tube -  but 1L [place] Throat:  ETT TUBE - no , OG tube - no Neck:  Supple,  No enlargement/tenderness/nodules Lungs: Clear to auscultation bilaterally,  Heart:  S1 and S2 normal, no murmur, CVP - x.  Pressors - neo Abdomen:  Soft, no masses, no organomegaly Genitalia / Rectal:  Not done Extremities:  Extremities- intact Skin:  ntact in exposed areas . Sacral area - not examined Neurologic:  Sedation - none -> RASS - +_1 . Moves all 4s - yes. CAM-ICU - neg . Orientation - x3+      Resolved Hospital Problem list     Assessment & Plan:   Lt sided weakness with slurred speech and facial droop. - found to have Rt ICA stenosis  10/08/2019 - seems fairly neuro intact on exam. 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 - 10/08/2019 - renal concrned about non-functioning graft  Plan  - per renal; might end up needing CRRT   DM poorly controlled. - SSI  DM neuropathy. - hold outpt neurontin  Hx  of hypothyroidism. - continue synthroid - check TSH  Hx of gout. - resume allopurinol when able to swallow pills  Dysphagia.  - speech to assess  Best practice:  Diet: NPO DVT prophylaxis: SQ heparin GI prophylaxis: not indicated Mobility: bed rest Code Status: full code Disposition: ICU Updates: patient and d/w DR Erlinda Hong at bedside      [facility]   The patient Frank [name] is critically ill with multiple organ systems failure and requires high complexity decision making for assessment and support, frequent evaluation and titration of therapies, application of advanced monitoring technologies and extensive  interpretation of multiple databases.   Critical Care Time devoted to patient care services described in this note is  31  Minutes. This time reflects time of care of this signee Dr Brand Males. This critical care time does not reflect procedure time, or teaching time or supervisory time of PA/NP/Med student/Med Resident etc but could involve care discussion time     Dr. Brand Males, M.D., [facility].C.P Pulmonary and Critical Care Medicine Staff Physician [facility] Pager: [phone], If no answer or between  15:00h - 7:00h: call [phone]  10/08/2019 10:12 AM    LABS    PULMONARY Recent Labs  Lab 10/07/19 0902  TCO2 31    CBC Recent Labs  Lab 10/07/19 0858 10/07/19 0902 10/08/19 0316  HGB 13.4 13.9 12.1*  HCT 42.5 41.0 37.3*  WBC 10.8*  --  9.2  PLT 209  --  204    COAGULATION Recent Labs  Lab 10/07/19 0858  INR 1.0    CARDIAC  No results for input(s): TROPONINI in the last 168 hours. No results for input(s): PROBNP in the last 168 hours.   CHEMISTRY Recent Labs  Lab 10/07/19 0858 10/07/19 0858 10/07/19 0902 10/08/19 0316  NA 137  --  134* 139  K 4.6   < > 4.4 4.4  CL 92*  --  94* 102  CO2 27  --   --  24  GLUCOSE 163*  --  158* 161*  BUN 54*  --  49* 67*  CREATININE 7.02*  --  7.10* 8.24*  CALCIUM 9.5  --   --  8.6*  PHOS  --   --   --  8.5*   < > = values in this interval not displayed.   Estimated Creatinine Clearance: 8.1 mL/min (A) (by C-G formula based on SCr of 8.24 mg/dL (H)).   LIVER Recent Labs  Lab 10/07/19 0858 10/08/19 0316  AST 40  --   ALT 24  --   ALKPHOS 99  --   BILITOT 0.4  --   PROT 7.3  --   ALBUMIN 3.5 2.6*  INR 1.0  --      INFECTIOUS Recent Labs  Lab 10/07/19 1003 10/07/19 1249  LATICACIDVEN 1.6 1.5     ENDOCRINE CBG (last 3)  Recent Labs    10/07/19 2317 10/08/19 0318 10/08/19 0747  GLUCAP 137* 152* 140*         IMAGING x48h  -  image(s) personally visualized  -   highlighted in bold CT ANGIO HEAD W OR WO CONTRAST  Result Date: 10/07/2019 CLINICAL DATA:  Code stroke, follow-up EXAM: CT ANGIOGRAPHY HEAD AND NECK CT PERFUSION BRAIN TECHNIQUE: Multidetector CT imaging of the head and neck was performed using the standard protocol during bolus administration of intravenous contrast. Multiplanar CT image reconstructions and MIPs  were obtained to evaluate the vascular anatomy. Carotid stenosis measurements (when applicable) are obtained utilizing NASCET criteria, using the distal internal carotid diameter as the denominator. Multiphase CT imaging of the brain was performed following IV bolus contrast injection. Subsequent parametric perfusion maps were calculated using RAPID software. CONTRAST:  131mL OMNIPAQUE IOHEXOL 350 MG/ML SOLN COMPARISON:  None. FINDINGS: CTA NECK FINDINGS Aortic arch: Great vessel origins are patent. There is right origin of the left vertebral artery from the arch, with calcified plaque noted at the origin. Right carotid system: Patent. There is calcified and noncalcified plaque along the proximal ICA causing 75% stenosis. Left carotid system: Patent. There is calcified plaque at the ICA origin causing minimal stenosis. Vertebral arteries: Patent. Calcified plaque at both vertebral artery origins without evidence of flow limitation. Right vertebral artery is dominant. Skeleton: Degenerative changes. Other neck: No mass or adenopathy. Upper chest: No apical lung mass. Review of the MIP images confirms the above findings CTA HEAD FINDINGS Anterior circulation: Intracranial internal carotid arteries are patent with calcified plaque causing mild stenosis. Anterior and middle cerebral arteries are patent. Posterior circulation: Intracranial vertebral arteries are patent with calcified plaque causing moderate stenosis on the left. Basilar artery is patent. Posterior cerebral arteries are patent with bilateral posterior  communicating arteries present. Venous sinuses: As permitted by contrast timing, patent. Review of the MIP images confirms the above findings CT Brain Perfusion Findings: CBF (<30%) Volume: 66mL Perfusion (Tmax>6.0s) volume: 69mL Mismatch Volume: 53mL Infarction Location: None IMPRESSION: No large vessel occlusion. No evidence of core infarction or territory at risk by perfusion imaging. Plaque at the proximal right ICA causes 75% stenosis. There is minimal stenosis of the left ICA origin. Electronically Signed   By: Macy Mis M.D.   On: 10/07/2019 09:41   CT ANGIO NECK W OR WO CONTRAST  Result Date: 10/07/2019 CLINICAL DATA:  Code stroke, follow-up EXAM: CT ANGIOGRAPHY HEAD AND NECK CT PERFUSION BRAIN TECHNIQUE: Multidetector CT imaging of the head and neck was performed using the standard protocol during bolus administration of intravenous contrast. Multiplanar CT image reconstructions and MIPs were obtained to evaluate the vascular anatomy. Carotid stenosis measurements (when applicable) are obtained utilizing NASCET criteria, using the distal internal carotid diameter as the denominator. Multiphase CT imaging of the brain was performed following IV bolus contrast injection. Subsequent parametric perfusion maps were calculated using RAPID software. CONTRAST:  157mL OMNIPAQUE IOHEXOL 350 MG/ML SOLN COMPARISON:  None. FINDINGS: CTA NECK FINDINGS Aortic arch: Great vessel origins are patent. There is right origin of the left vertebral artery from the arch, with calcified plaque noted at the origin. Right carotid system: Patent. There is calcified and noncalcified plaque along the proximal ICA causing 75% stenosis. Left carotid system: Patent. There is calcified plaque at the ICA origin causing minimal stenosis. Vertebral arteries: Patent. Calcified plaque at both vertebral artery origins without evidence of flow limitation. Right vertebral artery is dominant. Skeleton: Degenerative changes. Other neck: No mass  or adenopathy. Upper chest: No apical lung mass. Review of the MIP images confirms the above findings CTA HEAD FINDINGS Anterior circulation: Intracranial internal carotid arteries are patent with calcified plaque causing mild stenosis. Anterior and middle cerebral arteries are patent. Posterior circulation: Intracranial vertebral arteries are patent with calcified plaque causing moderate stenosis on the left. Basilar artery is patent. Posterior cerebral arteries are patent with bilateral posterior communicating arteries present. Venous sinuses: As permitted by contrast timing, patent. Review of the MIP images confirms the above findings CT Brain  Perfusion Findings: CBF (<30%) Volume: 59mL Perfusion (Tmax>6.0s) volume: 61mL Mismatch Volume: 25mL Infarction Location: None IMPRESSION: No large vessel occlusion. No evidence of core infarction or territory at risk by perfusion imaging. Plaque at the proximal right ICA causes 75% stenosis. There is minimal stenosis of the left ICA origin. Electronically Signed   By: Macy Mis M.D.   On: 10/07/2019 09:41   MR BRAIN WO CONTRAST  Result Date: 10/07/2019 CLINICAL DATA:  Initial evaluation for acute weakness, facial droop, slurred speech. Concern for stroke. EXAM: MRI HEAD WITHOUT CONTRAST TECHNIQUE: Multiplanar, multiecho pulse sequences of the brain and surrounding structures were obtained without intravenous contrast. COMPARISON:  Prior CTs from earlier the same day. FINDINGS: Brain: Moderately advanced age-related cerebral atrophy. Patchy T2/FLAIR hyperintensity within the periventricular white matter most consistent with chronic small vessel ischemic disease, mild in nature. Multifocal areas of restricted diffusion seen involving the periventricular/periatrial white matter of both cerebral hemispheres, consistent with acute ischemic small vessel type infarcts. Overall, infarct burden slightly worse on the right as compared to the left. For reference purposes,  largest area of ischemia measures approximately 12 mm at the posterior right frontal periventricular white matter. No associated hemorrhage or mass effect. No acute cortically based infarct. Gray-white matter differentiation otherwise maintained. No areas of remote cortical infarction. No acute intracranial hemorrhage. Single punctate chronic microhemorrhage noted at the right external capsule, likely related to small vessel disease. No other evidence for chronic intracranial hemorrhage. No mass lesion, midline shift or mass effect. No hydrocephalus. No extra-axial fluid collection. Incidental note made of a partially empty sella. Midline structures intact. Vascular: Major intracranial vascular flow voids are maintained. Skull and upper cervical spine: Craniocervical junction within normal limits. Bone marrow signal intensity normal. No scalp soft tissue abnormality. Sinuses/Orbits: Patient status post bilateral ocular lens replacement. Mild scattered mucosal thickening noted within the ethmoidal air cells. Paranasal sinuses are otherwise clear. No mastoid effusion. Inner ear structures grossly normal. Other: None. IMPRESSION: 1. 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 No associated hemorrhage or mass effect. 2. Underlying moderately advanced age-related cerebral atrophy with mild chronic small vessel ischemic disease. Electronically Signed   By: Jeannine Boga M.D.   On: 10/07/2019 20:34   DG Chest Portable 1 View  Result Date: 10/07/2019 CLINICAL DATA:  Pt from home with daughter LSN yesterday at 1500. Daughter saw pt this morning slumped over in a chair to the right side. Pt has been increasingly weak over the past week. EXAM: PORTABLE CHEST 1 VIEW COMPARISON:  01/23/2019 FINDINGS: There are low lung volumes. Mild linear opacity noted at the right lung base consistent with  atelectasis. Lungs otherwise clear. No pleural effusion or pneumothorax. Cardiac silhouette is normal in size. No mediastinal or hilar masses. Skeletal structures are grossly intact. IMPRESSION: No acute cardiopulmonary disease. Electronically Signed   By: Lajean Manes M.D.   On: 10/07/2019 09:57   ECHOCARDIOGRAM COMPLETE  Result Date: 10/07/2019   ECHOCARDIOGRAM REPORT   Patient Name:   WOODARD DANZA Date of Exam: 10/07/2019 Medical Rec #:  [id number]       Height:       67.0 in Accession #:    [id number]      Weight:       204.4 lb Date of Birth:  Aug 14, 1941       BSA:          2.04 m Patient Age:  78 years        BP:           129/96 mmHg Patient Gender: M               HR:           77 bpm. Exam Location:  Inpatient Procedure: 2D Echo, Cardiac Doppler and Color Doppler Indications:    CVA  History:        Patient has prior history of Echocardiogram examinations, most                 recent 01/22/2019. Stroke and PAD; Risk Factors:Hypertension,                 Current Smoker and Diabetes. ESRD.  Sonographer:    Dustin Flock Referring Phys: [place]  1. Left ventricular ejection fraction, by visual estimation, is 60 to 65%. The left ventricle has normal function. There is moderately increased left ventricular hypertrophy.  2. Left ventricular diastolic parameters are indeterminate.  3. The left ventricle has no regional wall motion abnormalities.  4. Global right ventricle has mildly reduced systolic function.The right ventricular size is normal. No increase in right ventricular wall thickness.  5. Left atrial size was normal.  6. Right atrial size was normal.  7. The mitral valve is normal in structure. Trivial mitral valve regurgitation.  8. The tricuspid valve is normal in structure.  9. The tricuspid valve is normal in structure. Tricuspid valve regurgitation is trivial. 10. The aortic valve is tricuspid. Aortic valve regurgitation is not visualized. Mild to moderate aortic valve  sclerosis/calcification without any evidence of aortic stenosis. 11. The pulmonic valve was grossly normal. Pulmonic valve regurgitation is not visualized. 12. Moderately elevated pulmonary artery systolic pressure. 13. No intracardiac source of embolism detected on this transthoracic study. A transesophageal echocardiogram is recommended to exclude cardiac source of embolism if clinically indicated. 14. The atrial septum is grossly normal. FINDINGS  Left Ventricle: Left ventricular ejection fraction, by visual estimation, is 60 to 65%. The left ventricle has normal function. The left ventricle has no regional wall motion abnormalities. There is moderately increased left ventricular hypertrophy. Concentric left ventricular hypertrophy. Left ventricular diastolic parameters are indeterminate. Right Ventricle: The right ventricular size is normal. No increase in right ventricular wall thickness. Global RV systolic function is has mildly reduced systolic function. The tricuspid regurgitant velocity is 3.49 m/s, and with an assumed right atrial pressure of 8 mmHg, the estimated right ventricular systolic pressure is moderately elevated at 56.7 mmHg. Left Atrium: Left atrial size was normal in size. Right Atrium: Right atrial size was normal in size Pericardium: There is no evidence of pericardial effusion. Mitral Valve: The mitral valve is normal in structure. There is mild thickening of the mitral valve leaflet(s). There is mild calcification of the mitral valve leaflet(s). Trivial mitral valve regurgitation. Tricuspid Valve: The tricuspid valve is normal in structure. Tricuspid valve regurgitation is trivial. Aortic Valve: The aortic valve is tricuspid. . There is mild thickening and moderate calcification of the aortic valve. Aortic valve regurgitation is not visualized. Mild to moderate aortic valve sclerosis/calcification is present, without any evidence of aortic stenosis. There is mild thickening of the aortic  valve. There is moderate calcification of the aortic valve. Pulmonic Valve: The pulmonic valve was grossly normal. Pulmonic valve regurgitation is not visualized. Pulmonic regurgitation is not visualized. Aorta: The aortic root and ascending aorta are structurally normal, with no evidence of dilitation. Pulmonary  Artery: The pulmonary artery is not well seen. Venous: The inferior vena cava was not well visualized. IAS/Shunts: The atrial septum is grossly normal. Additional Comments: No intracardiac source of embolism detected on this transthoracic study. A transesophageal echocardiogram is recommended to exclude cardiac source of embolism if clinically indicated.  LEFT VENTRICLE PLAX 2D LVIDd:         3.93 cm  Diastology LVIDs:         2.59 cm  LV e' lateral:   18.40 cm/s LV PW:         1.51 cm  LV E/e' lateral: 3.5 LV IVS:        1.69 cm  LV e' medial:    14.50 cm/s LVOT diam:     2.50 cm  LV E/e' medial:  4.4 LV SV:         43 ml LV SV Index:   20.12 LVOT Area:     4.91 cm  RIGHT VENTRICLE RV Basal diam:  3.00 cm RV S prime:     9.90 cm/s TAPSE (M-mode): 1.1 cm LEFT ATRIUM             Index       RIGHT ATRIUM           Index LA diam:        4.30 cm 2.11 cm/m  RA Area:     18.70 cm LA Vol (A2C):   37.0 ml 18.13 ml/m RA Volume:   46.30 ml  22.69 ml/m LA Vol (A4C):   52.0 ml 25.48 ml/m LA Biplane Vol: 44.3 ml 21.71 ml/m  AORTIC VALVE LVOT Vmax:   84.90 cm/s LVOT Vmean:  57.100 cm/s LVOT VTI:    0.158 m  AORTA Ao Root diam: 3.50 cm MITRAL VALVE                        TRICUSPID VALVE MV Area (PHT): 3.54 cm             TR Peak grad:   48.7 mmHg MV PHT:        62.06 msec           TR Vmax:        349.00 cm/s MV Decel Time: 214 msec MV E velocity: 63.70 cm/s 103 cm/s  SHUNTS MV A velocity: 90.00 cm/s 70.3 cm/s Systemic VTI:  0.16 m MV E/A ratio:  0.71       1.5       Systemic Diam: 2.50 cm  Buford Dresser MD Electronically signed by Buford Dresser MD Signature Date/Time: 10/07/2019/3:31:07 PM    Final     CT HEAD CODE STROKE WO CONTRAST  Result Date: 10/07/2019 CLINICAL DATA:  Code stroke. Focal neuro deficit, greater than 6 hours, stroke suspected, aphasia and facial droop. EXAM: CT HEAD WITHOUT CONTRAST TECHNIQUE: Contiguous axial images were obtained from the base of the skull through the vertex without intravenous contrast. COMPARISON:  No pertinent prior studies available for comparison. FINDINGS: Brain: No evidence of acute intracranial hemorrhage. No demarcated cortical infarction. No evidence of intracranial mass. No midline shift or extra-axial fluid collection. Mild generalized parenchymal atrophy. Vascular: No hyperdense vessel.  Atherosclerotic calcifications. Skull: Normal. Negative for fracture or focal lesion. Sinuses/Orbits: Visualized orbits demonstrate no acute abnormality. No significant paranasal sinus disease or mastoid effusion at the imaged levels. These results were communicated to Dr. Rory Percy at [place] 1/27/2021by text page via the [facility] messaging system. IMPRESSION: No CT  evidence of acute intracranial abnormality. Mild generalized parenchymal atrophy. Electronically Signed   By: Kellie Simmering DO   On: 10/07/2019 09:16   CT CEREBRAL PERFUSION W CM (CHARM STUDY)  Result Date: 10/07/2019 CLINICAL DATA:  Code stroke, follow-up EXAM: CT ANGIOGRAPHY HEAD AND NECK CT PERFUSION BRAIN TECHNIQUE: Multidetector CT imaging of the head and neck was performed using the standard protocol during bolus administration of intravenous contrast. Multiplanar CT image reconstructions and MIPs were obtained to evaluate the vascular anatomy. Carotid stenosis measurements (when applicable) are obtained utilizing NASCET criteria, using the distal internal carotid diameter as the denominator. Multiphase CT imaging of the brain was performed following IV bolus contrast injection. Subsequent parametric perfusion maps were calculated using RAPID software. CONTRAST:  148mL OMNIPAQUE IOHEXOL 350 MG/ML SOLN  COMPARISON:  None. FINDINGS: CTA NECK FINDINGS Aortic arch: Great vessel origins are patent. There is right origin of the left vertebral artery from the arch, with calcified plaque noted at the origin. Right carotid system: Patent. There is calcified and noncalcified plaque along the proximal ICA causing 75% stenosis. Left carotid system: Patent. There is calcified plaque at the ICA origin causing minimal stenosis. Vertebral arteries: Patent. Calcified plaque at both vertebral artery origins without evidence of flow limitation. Right vertebral artery is dominant. Skeleton: Degenerative changes. Other neck: No mass or adenopathy. Upper chest: No apical lung mass. Review of the MIP images confirms the above findings CTA HEAD FINDINGS Anterior circulation: Intracranial internal carotid arteries are patent with calcified plaque causing mild stenosis. Anterior and middle cerebral arteries are patent. Posterior circulation: Intracranial vertebral arteries are patent with calcified plaque causing moderate stenosis on the left. Basilar artery is patent. Posterior cerebral arteries are patent with bilateral posterior communicating arteries present. Venous sinuses: As permitted by contrast timing, patent. Review of the MIP images confirms the above findings CT Brain Perfusion Findings: CBF (<30%) Volume: 85mL Perfusion (Tmax>6.0s) volume: 21mL Mismatch Volume: 65mL Infarction Location: None IMPRESSION: No large vessel occlusion. No evidence of core infarction or territory at risk by perfusion imaging. Plaque at the proximal right ICA causes 75% stenosis. There is minimal stenosis of the left ICA origin. Electronically Signed   By: Macy Mis M.D.   On: 10/07/2019 09:41

## 2019-10-08 NOTE — Progress Notes (Signed)
   Dr Lavera Guise has asked Dr Estanislado Pandy to see this pt  MR: IMPRESSION: 1. 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 No associated hemorrhage or mass effect. 2. Underlying moderately advanced age-related cerebral atrophy with mild chronic small vessel ischemic disease.   Consideration for management and treatment  Pt with ESRD Dialysis graft is clotted Scheduled for temporary dialysis catheter today Dialysis today per Dr Jonnie Finner  Then scheduled for declot of graft in am Pt had eaten breakfast this morning Scheduled for declot in IR 1/29 afternoon  With these things in mind-- Dr Estanislado Pandy would need for dialysis to move forward for optimal procedural safety for this pt  Plan for probable cerebral arteriogram with possible R carotid artery intervention early next week  Daughter Tammie updated and agreeable

## 2019-10-08 NOTE — Progress Notes (Signed)
STROKE TEAM PROGRESS NOTE   INTERVAL HISTORY Pt lying in bed, awake alert and following commands. BP still soft, on Neo. Will have HD today in ICU. Still has mild left leg weakness. MRI showed bilateral WM strokes more watershed distribution. CTA head and neck found to have right ICA 75% stenosis as well as mild left ICA bulb and siphon athero. Discussed with Dr. Estanislado Pandy and will consider right ICA stenting.   Vitals:   10/08/19 0815 10/08/19 0830 10/08/19 0845 10/08/19 0900  BP:  (!) 119/53 (!) 105/46 (!) 103/49  Pulse: (!) 57 (!) 59 60 61  Resp: 19 (!) 24 20 20   Temp:      TempSrc:      SpO2: 97% 97% (!) 88% (!) 87%  Weight:      Height:        CBC:  Recent Labs  Lab 10/07/19 0858 10/07/19 0858 10/07/19 0902 10/08/19 0316  WBC 10.8*  --   --  9.2  NEUTROABS 8.6*  --   --   --   HGB 13.4   < > 13.9 12.1*  HCT 42.5   < > 41.0 37.3*  MCV 102.7*  --   --  101.6*  PLT 209  --   --  204   < > = values in this interval not displayed.    Basic Metabolic Panel:  Recent Labs  Lab 10/07/19 0858 10/07/19 0858 10/07/19 0902 10/08/19 0316  NA 137   < > 134* 139  K 4.6   < > 4.4 4.4  CL 92*   < > 94* 102  CO2 27  --   --  24  GLUCOSE 163*   < > 158* 161*  BUN 54*   < > 49* 67*  CREATININE 7.02*   < > 7.10* 8.24*  CALCIUM 9.5  --   --  8.6*  PHOS  --   --   --  8.5*   < > = values in this interval not displayed.   Lipid Panel:     Component Value Date/Time   CHOL 92 10/08/2019 0316   CHOL 149 05/11/2019 1037   TRIG 117 10/08/2019 0316   HDL 34 (L) 10/08/2019 0316   HDL 62 05/11/2019 1037   CHOLHDL 2.7 10/08/2019 0316   VLDL 23 10/08/2019 0316   LDLCALC 35 10/08/2019 0316   LDLCALC 72 05/11/2019 1037   HgbA1c:  Lab Results  Component Value Date   HGBA1C 6.7 (H) 10/08/2019   Urine Drug Screen:     Component Value Date/Time   LABOPIA NONE DETECTED 10/07/2019 1025   COCAINSCRNUR NONE DETECTED 10/07/2019 1025   LABBENZ NONE DETECTED 10/07/2019 1025   AMPHETMU  NONE DETECTED 10/07/2019 1025   THCU NONE DETECTED 10/07/2019 1025   LABBARB NONE DETECTED 10/07/2019 1025    Alcohol Level     Component Value Date/Time   ETH <10 10/07/2019 0858    IMAGING past 48 hours CT ANGIO HEAD W OR WO CONTRAST  Result Date: 10/07/2019 CLINICAL DATA:  Code stroke, follow-up EXAM: CT ANGIOGRAPHY HEAD AND NECK CT PERFUSION BRAIN TECHNIQUE: Multidetector CT imaging of the head and neck was performed using the standard protocol during bolus administration of intravenous contrast. Multiplanar CT image reconstructions and MIPs were obtained to evaluate the vascular anatomy. Carotid stenosis measurements (when applicable) are obtained utilizing NASCET criteria, using the distal internal carotid diameter as the denominator. Multiphase CT imaging of the brain was performed following IV bolus contrast injection.  Subsequent parametric perfusion maps were calculated using RAPID software. CONTRAST:  129mL OMNIPAQUE IOHEXOL 350 MG/ML SOLN COMPARISON:  None. FINDINGS: CTA NECK FINDINGS Aortic arch: Great vessel origins are patent. There is right origin of the left vertebral artery from the arch, with calcified plaque noted at the origin. Right carotid system: Patent. There is calcified and noncalcified plaque along the proximal ICA causing 75% stenosis. Left carotid system: Patent. There is calcified plaque at the ICA origin causing minimal stenosis. Vertebral arteries: Patent. Calcified plaque at both vertebral artery origins without evidence of flow limitation. Right vertebral artery is dominant. Skeleton: Degenerative changes. Other neck: No mass or adenopathy. Upper chest: No apical lung mass. Review of the MIP images confirms the above findings CTA HEAD FINDINGS Anterior circulation: Intracranial internal carotid arteries are patent with calcified plaque causing mild stenosis. Anterior and middle cerebral arteries are patent. Posterior circulation: Intracranial vertebral arteries are  patent with calcified plaque causing moderate stenosis on the left. Basilar artery is patent. Posterior cerebral arteries are patent with bilateral posterior communicating arteries present. Venous sinuses: As permitted by contrast timing, patent. Review of the MIP images confirms the above findings CT Brain Perfusion Findings: CBF (<30%) Volume: 4mL Perfusion (Tmax>6.0s) volume: 71mL Mismatch Volume: 79mL Infarction Location: None IMPRESSION: No large vessel occlusion. No evidence of core infarction or territory at risk by perfusion imaging. Plaque at the proximal right ICA causes 75% stenosis. There is minimal stenosis of the left ICA origin. Electronically Signed   By: Macy Mis M.D.   On: 10/07/2019 09:41   CT ANGIO NECK W OR WO CONTRAST  Result Date: 10/07/2019 CLINICAL DATA:  Code stroke, follow-up EXAM: CT ANGIOGRAPHY HEAD AND NECK CT PERFUSION BRAIN TECHNIQUE: Multidetector CT imaging of the head and neck was performed using the standard protocol during bolus administration of intravenous contrast. Multiplanar CT image reconstructions and MIPs were obtained to evaluate the vascular anatomy. Carotid stenosis measurements (when applicable) are obtained utilizing NASCET criteria, using the distal internal carotid diameter as the denominator. Multiphase CT imaging of the brain was performed following IV bolus contrast injection. Subsequent parametric perfusion maps were calculated using RAPID software. CONTRAST:  158mL OMNIPAQUE IOHEXOL 350 MG/ML SOLN COMPARISON:  None. FINDINGS: CTA NECK FINDINGS Aortic arch: Great vessel origins are patent. There is right origin of the left vertebral artery from the arch, with calcified plaque noted at the origin. Right carotid system: Patent. There is calcified and noncalcified plaque along the proximal ICA causing 75% stenosis. Left carotid system: Patent. There is calcified plaque at the ICA origin causing minimal stenosis. Vertebral arteries: Patent. Calcified plaque  at both vertebral artery origins without evidence of flow limitation. Right vertebral artery is dominant. Skeleton: Degenerative changes. Other neck: No mass or adenopathy. Upper chest: No apical lung mass. Review of the MIP images confirms the above findings CTA HEAD FINDINGS Anterior circulation: Intracranial internal carotid arteries are patent with calcified plaque causing mild stenosis. Anterior and middle cerebral arteries are patent. Posterior circulation: Intracranial vertebral arteries are patent with calcified plaque causing moderate stenosis on the left. Basilar artery is patent. Posterior cerebral arteries are patent with bilateral posterior communicating arteries present. Venous sinuses: As permitted by contrast timing, patent. Review of the MIP images confirms the above findings CT Brain Perfusion Findings: CBF (<30%) Volume: 46mL Perfusion (Tmax>6.0s) volume: 41mL Mismatch Volume: 19mL Infarction Location: None IMPRESSION: No large vessel occlusion. No evidence of core infarction or territory at risk by perfusion imaging. Plaque at the proximal right ICA  causes 75% stenosis. There is minimal stenosis of the left ICA origin. Electronically Signed   By: Macy Mis M.D.   On: 10/07/2019 09:41   MR BRAIN WO CONTRAST  Result Date: 10/07/2019 CLINICAL DATA:  Initial evaluation for acute weakness, facial droop, slurred speech. Concern for stroke. EXAM: MRI HEAD WITHOUT CONTRAST TECHNIQUE: Multiplanar, multiecho pulse sequences of the brain and surrounding structures were obtained without intravenous contrast. COMPARISON:  Prior CTs from earlier the same day. FINDINGS: Brain: Moderately advanced age-related cerebral atrophy. Patchy T2/FLAIR hyperintensity within the periventricular white matter most consistent with chronic small vessel ischemic disease, mild in nature. Multifocal areas of restricted diffusion seen involving the periventricular/periatrial white matter of both cerebral hemispheres,  consistent with acute ischemic small vessel type infarcts. Overall, infarct burden slightly worse on the right as compared to the left. For reference purposes, largest area of ischemia measures approximately 12 mm at the posterior right frontal periventricular white matter. No associated hemorrhage or mass effect. No acute cortically based infarct. Gray-white matter differentiation otherwise maintained. No areas of remote cortical infarction. No acute intracranial hemorrhage. Single punctate chronic microhemorrhage noted at the right external capsule, likely related to small vessel disease. No other evidence for chronic intracranial hemorrhage. No mass lesion, midline shift or mass effect. No hydrocephalus. No extra-axial fluid collection. Incidental note made of a partially empty sella. Midline structures intact. Vascular: Major intracranial vascular flow voids are maintained. Skull and upper cervical spine: Craniocervical junction within normal limits. Bone marrow signal intensity normal. No scalp soft tissue abnormality. Sinuses/Orbits: Patient status post bilateral ocular lens replacement. Mild scattered mucosal thickening noted within the ethmoidal air cells. Paranasal sinuses are otherwise clear. No mastoid effusion. Inner ear structures grossly normal. Other: None. IMPRESSION: 1. 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 No associated hemorrhage or mass effect. 2. Underlying moderately advanced age-related cerebral atrophy with mild chronic small vessel ischemic disease. Electronically Signed   By: Jeannine Boga M.D.   On: 10/07/2019 20:34   DG Chest Portable 1 View  Result Date: 10/07/2019 CLINICAL DATA:  Pt from home with daughter LSN yesterday at 1500. Daughter saw pt this morning slumped over in a chair to the right side. Pt has been increasingly weak over the past week. EXAM:  PORTABLE CHEST 1 VIEW COMPARISON:  01/23/2019 FINDINGS: There are low lung volumes. Mild linear opacity noted at the right lung base consistent with atelectasis. Lungs otherwise clear. No pleural effusion or pneumothorax. Cardiac silhouette is normal in size. No mediastinal or hilar masses. Skeletal structures are grossly intact. IMPRESSION: No acute cardiopulmonary disease. Electronically Signed   By: Lajean Manes M.D.   On: 10/07/2019 09:57   ECHOCARDIOGRAM COMPLETE  Result Date: 10/07/2019   ECHOCARDIOGRAM REPORT   Patient Name:   CALLEN RAWLING Date of Exam: 10/07/2019 Medical Rec #:  [id number]       Height:       67.0 in Accession #:    [id number]      Weight:       204.4 lb Date of Birth:  Apr 13, 1941       BSA:          2.04 m Patient Age:    73 years        BP:           129/96 mmHg Patient Gender: M  HR:           77 bpm. Exam Location:  Inpatient Procedure: 2D Echo, Cardiac Doppler and Color Doppler Indications:    CVA  History:        Patient has prior history of Echocardiogram examinations, most                 recent 01/22/2019. Stroke and PAD; Risk Factors:Hypertension,                 Current Smoker and Diabetes. ESRD.  Sonographer:    Dustin Flock Referring Phys: [place]  1. Left ventricular ejection fraction, by visual estimation, is 60 to 65%. The left ventricle has normal function. There is moderately increased left ventricular hypertrophy.  2. Left ventricular diastolic parameters are indeterminate.  3. The left ventricle has no regional wall motion abnormalities.  4. Global right ventricle has mildly reduced systolic function.The right ventricular size is normal. No increase in right ventricular wall thickness.  5. Left atrial size was normal.  6. Right atrial size was normal.  7. The mitral valve is normal in structure. Trivial mitral valve regurgitation.  8. The tricuspid valve is normal in structure.  9. The tricuspid valve is normal in structure.  Tricuspid valve regurgitation is trivial. 10. The aortic valve is tricuspid. Aortic valve regurgitation is not visualized. Mild to moderate aortic valve sclerosis/calcification without any evidence of aortic stenosis. 11. The pulmonic valve was grossly normal. Pulmonic valve regurgitation is not visualized. 12. Moderately elevated pulmonary artery systolic pressure. 13. No intracardiac source of embolism detected on this transthoracic study. A transesophageal echocardiogram is recommended to exclude cardiac source of embolism if clinically indicated. 14. The atrial septum is grossly normal. FINDINGS  Left Ventricle: Left ventricular ejection fraction, by visual estimation, is 60 to 65%. The left ventricle has normal function. The left ventricle has no regional wall motion abnormalities. There is moderately increased left ventricular hypertrophy. Concentric left ventricular hypertrophy. Left ventricular diastolic parameters are indeterminate. Right Ventricle: The right ventricular size is normal. No increase in right ventricular wall thickness. Global RV systolic function is has mildly reduced systolic function. The tricuspid regurgitant velocity is 3.49 m/s, and with an assumed right atrial pressure of 8 mmHg, the estimated right ventricular systolic pressure is moderately elevated at 56.7 mmHg. Left Atrium: Left atrial size was normal in size. Right Atrium: Right atrial size was normal in size Pericardium: There is no evidence of pericardial effusion. Mitral Valve: The mitral valve is normal in structure. There is mild thickening of the mitral valve leaflet(s). There is mild calcification of the mitral valve leaflet(s). Trivial mitral valve regurgitation. Tricuspid Valve: The tricuspid valve is normal in structure. Tricuspid valve regurgitation is trivial. Aortic Valve: The aortic valve is tricuspid. . There is mild thickening and moderate calcification of the aortic valve. Aortic valve regurgitation is not  visualized. Mild to moderate aortic valve sclerosis/calcification is present, without any evidence of aortic stenosis. There is mild thickening of the aortic valve. There is moderate calcification of the aortic valve. Pulmonic Valve: The pulmonic valve was grossly normal. Pulmonic valve regurgitation is not visualized. Pulmonic regurgitation is not visualized. Aorta: The aortic root and ascending aorta are structurally normal, with no evidence of dilitation. Pulmonary Artery: The pulmonary artery is not well seen. Venous: The inferior vena cava was not well visualized. IAS/Shunts: The atrial septum is grossly normal. Additional Comments: No intracardiac source of embolism detected on this transthoracic study. A transesophageal echocardiogram  is recommended to exclude cardiac source of embolism if clinically indicated.  LEFT VENTRICLE PLAX 2D LVIDd:         3.93 cm  Diastology LVIDs:         2.59 cm  LV e' lateral:   18.40 cm/s LV PW:         1.51 cm  LV E/e' lateral: 3.5 LV IVS:        1.69 cm  LV e' medial:    14.50 cm/s LVOT diam:     2.50 cm  LV E/e' medial:  4.4 LV SV:         43 ml LV SV Index:   20.12 LVOT Area:     4.91 cm  RIGHT VENTRICLE RV Basal diam:  3.00 cm RV S prime:     9.90 cm/s TAPSE (M-mode): 1.1 cm LEFT ATRIUM             Index       RIGHT ATRIUM           Index LA diam:        4.30 cm 2.11 cm/m  RA Area:     18.70 cm LA Vol (A2C):   37.0 ml 18.13 ml/m RA Volume:   46.30 ml  22.69 ml/m LA Vol (A4C):   52.0 ml 25.48 ml/m LA Biplane Vol: 44.3 ml 21.71 ml/m  AORTIC VALVE LVOT Vmax:   84.90 cm/s LVOT Vmean:  57.100 cm/s LVOT VTI:    0.158 m  AORTA Ao Root diam: 3.50 cm MITRAL VALVE                        TRICUSPID VALVE MV Area (PHT): 3.54 cm             TR Peak grad:   48.7 mmHg MV PHT:        62.06 msec           TR Vmax:        349.00 cm/s MV Decel Time: 214 msec MV E velocity: 63.70 cm/s 103 cm/s  SHUNTS MV A velocity: 90.00 cm/s 70.3 cm/s Systemic VTI:  0.16 m MV E/A ratio:  0.71        1.5       Systemic Diam: 2.50 cm  Buford Dresser MD Electronically signed by Buford Dresser MD Signature Date/Time: 10/07/2019/3:31:07 PM    Final    CT HEAD CODE STROKE WO CONTRAST  Result Date: 10/07/2019 CLINICAL DATA:  Code stroke. Focal neuro deficit, greater than 6 hours, stroke suspected, aphasia and facial droop. EXAM: CT HEAD WITHOUT CONTRAST TECHNIQUE: Contiguous axial images were obtained from the base of the skull through the vertex without intravenous contrast. COMPARISON:  No pertinent prior studies available for comparison. FINDINGS: Brain: No evidence of acute intracranial hemorrhage. No demarcated cortical infarction. No evidence of intracranial mass. No midline shift or extra-axial fluid collection. Mild generalized parenchymal atrophy. Vascular: No hyperdense vessel.  Atherosclerotic calcifications. Skull: Normal. Negative for fracture or focal lesion. Sinuses/Orbits: Visualized orbits demonstrate no acute abnormality. No significant paranasal sinus disease or mastoid effusion at the imaged levels. These results were communicated to Dr. Rory Percy at [facility] 1/27/2021by text page via the [facility] messaging system. IMPRESSION: No CT evidence of acute intracranial abnormality. Mild generalized parenchymal atrophy. Electronically Signed   By: Kellie Simmering DO   On: 10/07/2019 09:16   CT CEREBRAL PERFUSION W CM (CHARM STUDY)  Result Date: 10/07/2019 CLINICAL DATA:  Code stroke, follow-up EXAM: CT ANGIOGRAPHY HEAD AND NECK CT PERFUSION BRAIN TECHNIQUE: Multidetector CT imaging of the head and neck was performed using the standard protocol during bolus administration of intravenous contrast. Multiplanar CT image reconstructions and MIPs were obtained to evaluate the vascular anatomy. Carotid stenosis measurements (when applicable) are obtained utilizing NASCET criteria, using the distal internal carotid diameter as the denominator. Multiphase CT imaging of the brain was performed following  IV bolus contrast injection. Subsequent parametric perfusion maps were calculated using RAPID software. CONTRAST:  144mL OMNIPAQUE IOHEXOL 350 MG/ML SOLN COMPARISON:  None. FINDINGS: CTA NECK FINDINGS Aortic arch: Great vessel origins are patent. There is right origin of the left vertebral artery from the arch, with calcified plaque noted at the origin. Right carotid system: Patent. There is calcified and noncalcified plaque along the proximal ICA causing 75% stenosis. Left carotid system: Patent. There is calcified plaque at the ICA origin causing minimal stenosis. Vertebral arteries: Patent. Calcified plaque at both vertebral artery origins without evidence of flow limitation. Right vertebral artery is dominant. Skeleton: Degenerative changes. Other neck: No mass or adenopathy. Upper chest: No apical lung mass. Review of the MIP images confirms the above findings CTA HEAD FINDINGS Anterior circulation: Intracranial internal carotid arteries are patent with calcified plaque causing mild stenosis. Anterior and middle cerebral arteries are patent. Posterior circulation: Intracranial vertebral arteries are patent with calcified plaque causing moderate stenosis on the left. Basilar artery is patent. Posterior cerebral arteries are patent with bilateral posterior communicating arteries present. Venous sinuses: As permitted by contrast timing, patent. Review of the MIP images confirms the above findings CT Brain Perfusion Findings: CBF (<30%) Volume: 66mL Perfusion (Tmax>6.0s) volume: 70mL Mismatch Volume: 74mL Infarction Location: None IMPRESSION: No large vessel occlusion. No evidence of core infarction or territory at risk by perfusion imaging. Plaque at the proximal right ICA causes 75% stenosis. There is minimal stenosis of the left ICA origin. Electronically Signed   By: Macy Mis M.D.   On: 10/07/2019 09:41    PHYSICAL EXAM  Temp:  [97.4 F (36.3 C)-98.1 F (36.7 C)] 98.1 F (36.7 C) (01/28 1200) Pulse  Rate:  [25-87] 68 (01/28 1215) Resp:  [0-32] 23 (01/28 1215) BP: (73-157)/(44-110) 133/65 (01/28 1200) SpO2:  [79 %-100 %] 93 % (01/28 1215)  General - Well nourished, well developed, in no apparent distress.  Ophthalmologic - fundi not visualized due to noncooperation.  Cardiovascular - Regular rhythm and rate.  Mental Status -  Level of arousal and orientation to time, place, and person were intact. Language including expression, naming, repetition, comprehension was assessed and found intact. Fund of Knowledge was assessed and was intact.  Cranial Nerves II - XII - II - Visual field intact OU. III, IV, VI - Extraocular movements intact. V - Facial sensation intact bilaterally. VII - Facial movement intact bilaterally. VIII - Hearing & vestibular intact bilaterally. X - Palate elevates symmetrically. XI - Chin turning & shoulder shrug intact bilaterally. XII - Tongue protrusion intact.  Motor Strength - The patient's strength was symmetrical BUEs 4/5, RLE proximal 4/5, LLE proximal 3-/5, distally 4/4 bilaterally. Pronator drift was absent.  Bulk was normal and fasciculations were absent.   Motor Tone - Muscle tone was assessed at the neck and appendages and was normal.  Reflexes - The patient's reflexes were symmetrical in all extremities and he had no pathological reflexes.  Sensory - Light touch, temperature/pinprick were assessed and were symmetrical.    Coordination - The patient had normal  movements in the hands with no ataxia or dysmetria.  Tremor was absent.  Gait and Station - deferred.   ASSESSMENT/PLAN [name] is a 79 y.o. male with history of ESRD on HD, DB, DB nephropathy, HTN, HLD, hypothyroidism, PN, PVD presenting with progressive weakness, L facial droop, dysarthria and possible L>R LE weakness. Found to have R ICA 75% stenosis.  Stroke: Multiple B scattered WM infarcts, R>L, watershed territories, secondary to hypoperfusion in the setting of  chronic hypotension and right ICA high grade stenosis  Code Stroke CT head No acute abnormality. Atrophy.   CTA head & neck no LVO. R ICA 75% stenosis. Minimal L ICA stenosis   CT perfusion no core infarct   MRI  Multifocal B small vessel infarcts. Small vessel disease. Atrophy.   2D Echo EF 60-65%. No source of embolus   LDL 35  HgbA1c 6.7  Heparin subq for DVT prophylaxis  aspirin 81 mg daily prior to admission, now on aspirin 325 mg daily and clopidogrel 75 mg daily.   Therapy recommendations:  pending   Disposition:  pending   R Carotid stenosis   CTA neck R ICA 75% stenosis. Minimal L ICA stenosis   At least partially responsible for current watershed infarcts  Discussed with Dr. Estanislado Pandy  Consider right ICA stenting when appropriate  Chronic Hypotension . 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 Likely the cause of current bilateral watershed territory infarcts . On neo drip . Add midodrine 10 mg 3 times daily . SBP goal 120 - 140 . Long-term BP goal normotensive . Avoid low BP  Hyperlipidemia  Home meds:  lipitor 20, resumed in hospital  LDL 35, goal < 70  Will not treat with intensive statin given low LDL on lipitor 20  Continue statin at discharge  Diabetes type II Controlled Diabetic Neuropathy  HgbA1c 6.7, goal < 7.0  SSI  CBG monitoring   ESRD on hemodialysis  TTS schedule  Renal on board  Place temporary catheter for HD  Declot left AV fistula in a.m.  Other Stroke Risk Factors  Advanced age  Former Cigarette smoker, quit 27 yrs ago  Obesity, Body mass index is 32.01 kg/m., recommend weight loss, diet and exercise as appropriate   PVD  Other Active Problems  Anemia of chronic kidney dz  Secondary hyperparathryoidism   Hypothyroidism  Hx gout  Hospital day # 1  This patient is critically ill due to stroke, right ICA stenosis, hypotension, ESRD on dialysis and at significant risk of neurological worsening,  death form recurrent stroke, hemorrhagic conversion, seizure, shock, fluid overload. This patient's care requires constant monitoring of vital signs, hemodynamics, respiratory and cardiac monitoring, review of multiple databases, neurological assessment, discussion with family, other specialists and medical decision making of high complexity. I spent 40 minutes of neurocritical care time in the care of this patient.  I have discussed with Dr. Estanislado Pandy and Dr. Chase Caller.  Rosalin Hawking, MD PhD Stroke Neurology 10/08/2019 7:21 PM   To contact Stroke Continuity provider, please refer to [email or web address] After hours, contact General Neurology

## 2019-10-08 NOTE — Consult Note (Addendum)
Chief Complaint: Patient was seen in consultation today for temporary catheter placement today and left arm declot tomorrow Chief Complaint  Patient presents with  . Code Stroke   at the request of Dr Mickel Crow   Supervising Physician: Markus Daft  Patient Status: [facility] - In-pt  History of Present Illness: Frank [name] is a 79 y.o. male   ESRD Left arm dialysis graft Last use Tuesday as OP Completed dialysis New CVA just last pm---outside of window for thrombolysis Now on Plavix and ASA Using phenylephrine for low BP  MR: IMPRESSION: 1. 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 No associated hemorrhage or mass effect. 2. Underlying moderately advanced age-related cerebral atrophy with mild chronic small vessel ischemic disease.  Also noted--Left arm dialysis graft is clotted Dr Jonnie Finner has seen and examined pt Requesting left arm dialysis graft thrombolysis Has eaten BF today Will plan for declot tomorrow in IR Nephrology asking for temp cath today-- will need dialysis today  I have spoken to his daughter Frank [name]-- she is aware and agreeable She is an Therapist, sports   Past Medical History:  Diagnosis Date  . Anemia of chronic [name] failure   . Arthritis   . Bradycardia   . Cataracts, bilateral 05/15/2012  . Chronic idiopathic gout of multiple sites 02/10/2015  . Chronic [name] disease    Frank [name]  . Depression   . Diabetes mellitus without complication (HCC)    diet controlled   . Diabetic nephropathy ([place])   . ED (erectile dysfunction) 08/28/2012  . Elevated liver enzymes 06/10/2019  . Epiretinal membrane (ERM) of right eye 06/10/2019   Right eye saw AE 06/05/2019    . Essential hypertension 05/07/2013   Last Assessment & Plan:  Formatting of this note might be different from the original. HTN PLAN   BP goal is <140/90 BP Readings from Last 3  Encounters:  08/27/16 140/79  08/13/16 141/80  08/06/16 144/86   Current Anti-Hyptensive Medications: None  Today's Recommendations: Continue lifestyle modifications. Defer starting medication to PCP.  Recommend continued regular exercise as tolerated. Recomm  . History of UTI    s/p foley in hospital 01/2019   . Hyperlipidemia   . Hypothyroidism   . Illiterate 11/19/2013   Last Assessment & Plan:  Assisted patient with Handicapped Placard application today.    . Insomnia 05/08/2019  . Neuropathy   . Peripheral neuropathy   . Peripheral vascular disease ([place])   . Secondary hyperparathyroidism ([place])    Renal  . Thyroid disease     Past Surgical History:  Procedure Laterality Date  . AV FISTULA PLACEMENT Left 11/19/2017   Procedure: ARTERIOVENOUS (AV) FISTULA CREATION;  Surgeon: [name], MD;  Location: [place];  Service: Vascular;  Laterality: Left;  . BASCILIC VEIN TRANSPOSITION Left 06/04/2018   Procedure: BASILIC VEIN TRANSPOSITION ARM;  Surgeon: [name], MD;  Location: [place];  Service: Vascular;  Laterality: Left;  . COLONOSCOPY    . DIALYSIS/PERMA CATHETER REMOVAL N/A 03/23/2019   Procedure: DIALYSIS/PERMA CATHETER REMOVAL;  Surgeon: [name], MD;  Location: [place] [facility];  Service: Cardiovascular;  Laterality: N/A;  . EYE SURGERY     cataract surgery b/l; photophobia since   . INSERTION OF DIALYSIS CATHETER N/A 01/23/2019   Procedure: INSERTION OF DIALYSIS CATHETER;  Surgeon: [name], MD;  Location: [facility];  Service: Vascular;  Laterality: N/A;  INSERTION OF  DIALYSIS CATHETER  . KNEE ARTHROSCOPY Left    Knee  . PERIPHERAL VASCULAR THROMBECTOMY Left 07/06/2019   Procedure: PERIPHERAL VASCULAR THROMBECTOMY;  Surgeon: [name], MD;  Location: [place] [facility];  Service: Cardiovascular;  Laterality: Left;  . THROMBECTOMY W/ EMBOLECTOMY Left 01/27/2019   Procedure: THROMBECTOMY Left arm ARTERIOVENOUS FISTULA  and Left arm  Arteriovenous gortex graft;  Surgeon: [name], MD;  Location: [place];  Service: Vascular;  Laterality: Left;  Marland Kitchen VENOGRAM Left 01/27/2019   Procedure: Left arm FISTULOGRAM/;  Surgeon: [name], MD;  Location: [place];  Service: Vascular;  Laterality: Left;    Allergies: Ace inhibitors, Angiotensin receptor blockers, Ivp dye [iodinated diagnostic agents], and Adhesive [tape]  Medications: Prior to Admission medications   Medication Sig Start Date End Date Taking? Authorizing Provider  allopurinol (ZYLOPRIM) 100 MG tablet Take 1 tablet (100 mg total) by mouth daily. 06/03/19   McLean-Scocuzza, Nino Glow, MD  aspirin EC 81 MG tablet Take 81 mg daily by mouth.    [provider]  atorvastatin (LIPITOR) 20 MG tablet Take 20 mg by mouth daily at 6 PM.     [provider]  docusate sodium (COLACE) 100 MG capsule Take 200 mg by mouth daily as needed.     [provider]  gabapentin (NEURONTIN) 300 MG capsule Take 1 capsule (300 mg total) by mouth daily. At 3 PM 05/08/19   McLean-Scocuzza, Nino Glow, MD  ketoconazole (NIZORAL) 2 % shampoo Apply 1 application topically 2 (two) times a week. Patient taking differently: Apply 1 application topically as needed.  06/11/19   McLean-Scocuzza, Nino Glow, MD  levothyroxine (SYNTHROID) 150 MCG tablet Take 150 mcg by mouth daily before breakfast.    [provider]  lidocaine-prilocaine (EMLA) cream APPLY SMALL AMOUNT TO ACCESS SITE (AVF) 1 TO 2 HOURS BEFORE DIALYSIS. COVER WITH OCCLUSIVE DRESSING (SARAN WRAP) 04/08/19   [provider]  midodrine (PROAMATINE) 10 MG tablet Take 1 tablet (10 mg total) by mouth 3 (three) times a week. Take within 1/2 hour prior to dialysis start 09/16/19   Baldwin Jamaica, PA-C  multivitamin (RENA-VIT) TABS tablet Take 1 tablet daily by mouth.    [provider]  ondansetron (ZOFRAN) 4 MG tablet Take 4 mg by mouth every 8 (eight) hours as needed for nausea or vomiting.   01/19/19   [provider]  sevelamer carbonate (RENVELA) 2.4 g PACK Take by mouth 3 (three) times daily with meals. And with any snacks     [provider]  tamsulosin (FLOMAX) 0.4 MG CAPS capsule Take 0.4 mg by mouth daily after supper.     [provider]  Zinc Oxide (BALMEX EX) Apply 1 application topically daily as needed (to irritated areas of skin).     [provider]     Family History  Problem Relation Age of Onset  . Diabetes Mother   . [name] disease Mother   . Liver disease Mother   . Heart Problems Mother     Social History   Socioeconomic History  . Marital status: Divorced    Spouse name: Not on file  . Number of children: 4  . Years of education: Not on file  . Highest education level: 8th grade  Occupational History  . Not on file  Tobacco Use  . Smoking status: Former Smoker    Years: 30.00    Quit date: 1994    Years since quitting: 27.0  .  Smokeless tobacco: Never Used  Substance and Sexual Activity  . Alcohol use: No  . Drug use: No  . Sexual activity: Not Currently  Other Topics Concern  . Not on file  Social History Narrative   4 kids (3 daughters and 1 son)   Daughter [name] DPR   Social Determinants of Health   Financial Resource Strain: Low Risk   . Difficulty of Paying Living Expenses: Not hard at all  Food Insecurity: No Food Insecurity  . Worried About Charity fundraiser in the Last Year: Never true  . Ran Out of Food in the Last Year: Never true  Transportation Needs: No Transportation Needs  . Lack of Transportation (Medical): No  . Lack of Transportation (Non-Medical): No  Physical Activity:   . Days of Exercise per Week: Not on file  . Minutes of Exercise per Session: Not on file  Stress: No Stress Concern Present  . Feeling of Stress : Only a little  Social Connections: Unknown  . Frequency of Communication with Friends and Family: More than three times a week  . Frequency of Social  Gatherings with Friends and Family: Not on file  . Attends Religious Services: Not on file  . Active Member of Clubs or Organizations: Not on file  . Attends Archivist Meetings: Not on file  . Marital Status: Divorced    Review of Systems: A 12 point ROS discussed and pertinent positives are indicated in the HPI above.  All other systems are negative.  Review of Systems  Constitutional: Positive for activity change. Negative for fatigue, fever and unexpected weight change.  Respiratory: Negative for cough and shortness of breath.   Cardiovascular: Negative for chest pain.  Gastrointestinal: Negative for abdominal pain.  Musculoskeletal: Positive for gait problem.  Neurological: Positive for speech difficulty and weakness. Negative for dizziness, tremors, seizures, syncope, facial asymmetry, light-headedness, numbness and headaches.  Psychiatric/Behavioral: Positive for decreased concentration. Negative for behavioral problems.    Vital Signs: BP (!) 136/55   Pulse 61   Temp (!) 97.4 F (36.3 C) (Oral)   Resp (!) 21   Ht 5\' 7"  (1.702 m)   Wt 204 lb 5.9 oz (92.7 kg)   SpO2 95%   BMI 32.01 kg/m   Physical Exam Vitals reviewed.  Cardiovascular:     Rate and Rhythm: Normal rate and regular rhythm.     Heart sounds: Normal heart sounds.  Pulmonary:     Breath sounds: Normal breath sounds.  Abdominal:     Palpations: Abdomen is soft.  Musculoskeletal:        General: Normal range of motion.     Comments: Left arm dialysis graft clotted No thrill No bruit  Skin:    General: Skin is warm and dry.  Neurological:     Mental Status: He is alert and oriented to person, place, and time.  Psychiatric:     Comments: Discussed with Dtr Tammie via phone Consent signed      Imaging: CT ANGIO HEAD W OR WO CONTRAST  Result Date: 10/07/2019 CLINICAL DATA:  Code stroke, follow-up EXAM: CT ANGIOGRAPHY HEAD AND NECK CT PERFUSION BRAIN TECHNIQUE: Multidetector CT imaging of  the head and neck was performed using the standard protocol during bolus administration of intravenous contrast. Multiplanar CT image reconstructions and MIPs were obtained to evaluate the vascular anatomy. Carotid stenosis measurements (when applicable) are obtained utilizing NASCET criteria, using the distal internal carotid diameter as the denominator. Multiphase CT imaging of  the brain was performed following IV bolus contrast injection. Subsequent parametric perfusion maps were calculated using RAPID software. CONTRAST:  149mL OMNIPAQUE IOHEXOL 350 MG/ML SOLN COMPARISON:  None. FINDINGS: CTA NECK FINDINGS Aortic arch: Great vessel origins are patent. There is right origin of the left vertebral artery from the arch, with calcified plaque noted at the origin. Right carotid system: Patent. There is calcified and noncalcified plaque along the proximal ICA causing 75% stenosis. Left carotid system: Patent. There is calcified plaque at the ICA origin causing minimal stenosis. Vertebral arteries: Patent. Calcified plaque at both vertebral artery origins without evidence of flow limitation. Right vertebral artery is dominant. Skeleton: Degenerative changes. Other neck: No mass or adenopathy. Upper chest: No apical lung mass. Review of the MIP images confirms the above findings CTA HEAD FINDINGS Anterior circulation: Intracranial internal carotid arteries are patent with calcified plaque causing mild stenosis. Anterior and middle cerebral arteries are patent. Posterior circulation: Intracranial vertebral arteries are patent with calcified plaque causing moderate stenosis on the left. Basilar artery is patent. Posterior cerebral arteries are patent with bilateral posterior communicating arteries present. Venous sinuses: As permitted by contrast timing, patent. Review of the MIP images confirms the above findings CT Brain Perfusion Findings: CBF (<30%) Volume: 44mL Perfusion (Tmax>6.0s) volume: 56mL Mismatch Volume: 76mL  Infarction Location: None IMPRESSION: No large vessel occlusion. No evidence of core infarction or territory at risk by perfusion imaging. Plaque at the proximal right ICA causes 75% stenosis. There is minimal stenosis of the left ICA origin. Electronically Signed   By: Macy Mis M.D.   On: 10/07/2019 09:41   CT ANGIO NECK W OR WO CONTRAST  Result Date: 10/07/2019 CLINICAL DATA:  Code stroke, follow-up EXAM: CT ANGIOGRAPHY HEAD AND NECK CT PERFUSION BRAIN TECHNIQUE: Multidetector CT imaging of the head and neck was performed using the standard protocol during bolus administration of intravenous contrast. Multiplanar CT image reconstructions and MIPs were obtained to evaluate the vascular anatomy. Carotid stenosis measurements (when applicable) are obtained utilizing NASCET criteria, using the distal internal carotid diameter as the denominator. Multiphase CT imaging of the brain was performed following IV bolus contrast injection. Subsequent parametric perfusion maps were calculated using RAPID software. CONTRAST:  18mL OMNIPAQUE IOHEXOL 350 MG/ML SOLN COMPARISON:  None. FINDINGS: CTA NECK FINDINGS Aortic arch: Great vessel origins are patent. There is right origin of the left vertebral artery from the arch, with calcified plaque noted at the origin. Right carotid system: Patent. There is calcified and noncalcified plaque along the proximal ICA causing 75% stenosis. Left carotid system: Patent. There is calcified plaque at the ICA origin causing minimal stenosis. Vertebral arteries: Patent. Calcified plaque at both vertebral artery origins without evidence of flow limitation. Right vertebral artery is dominant. Skeleton: Degenerative changes. Other neck: No mass or adenopathy. Upper chest: No apical lung mass. Review of the MIP images confirms the above findings CTA HEAD FINDINGS Anterior circulation: Intracranial internal carotid arteries are patent with calcified plaque causing mild stenosis. Anterior  and middle cerebral arteries are patent. Posterior circulation: Intracranial vertebral arteries are patent with calcified plaque causing moderate stenosis on the left. Basilar artery is patent. Posterior cerebral arteries are patent with bilateral posterior communicating arteries present. Venous sinuses: As permitted by contrast timing, patent. Review of the MIP images confirms the above findings CT Brain Perfusion Findings: CBF (<30%) Volume: 71mL Perfusion (Tmax>6.0s) volume: 19mL Mismatch Volume: 35mL Infarction Location: None IMPRESSION: No large vessel occlusion. No evidence of core infarction or territory at risk  by perfusion imaging. Plaque at the proximal right ICA causes 75% stenosis. There is minimal stenosis of the left ICA origin. Electronically Signed   By: Macy Mis M.D.   On: 10/07/2019 09:41   MR BRAIN WO CONTRAST  Result Date: 10/07/2019 CLINICAL DATA:  Initial evaluation for acute weakness, facial droop, slurred speech. Concern for stroke. EXAM: MRI HEAD WITHOUT CONTRAST TECHNIQUE: Multiplanar, multiecho pulse sequences of the brain and surrounding structures were obtained without intravenous contrast. COMPARISON:  Prior CTs from earlier the same day. FINDINGS: Brain: Moderately advanced age-related cerebral atrophy. Patchy T2/FLAIR hyperintensity within the periventricular white matter most consistent with chronic small vessel ischemic disease, mild in nature. Multifocal areas of restricted diffusion seen involving the periventricular/periatrial white matter of both cerebral hemispheres, consistent with acute ischemic small vessel type infarcts. Overall, infarct burden slightly worse on the right as compared to the left. For reference purposes, largest area of ischemia measures approximately 12 mm at the posterior right frontal periventricular white matter. No associated hemorrhage or mass effect. No acute cortically based infarct. Gray-white matter differentiation otherwise maintained. No  areas of remote cortical infarction. No acute intracranial hemorrhage. Single punctate chronic microhemorrhage noted at the right external capsule, likely related to small vessel disease. No other evidence for chronic intracranial hemorrhage. No mass lesion, midline shift or mass effect. No hydrocephalus. No extra-axial fluid collection. Incidental note made of a partially empty sella. Midline structures intact. Vascular: Major intracranial vascular flow voids are maintained. Skull and upper cervical spine: Craniocervical junction within normal limits. Bone marrow signal intensity normal. No scalp soft tissue abnormality. Sinuses/Orbits: Patient status post bilateral ocular lens replacement. Mild scattered mucosal thickening noted within the ethmoidal air cells. Paranasal sinuses are otherwise clear. No mastoid effusion. Inner ear structures grossly normal. Other: None. IMPRESSION: 1. 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 No associated hemorrhage or mass effect. 2. Underlying moderately advanced age-related cerebral atrophy with mild chronic small vessel ischemic disease. Electronically Signed   By: Jeannine Boga M.D.   On: 10/07/2019 20:34   DG Chest Portable 1 View  Result Date: 10/07/2019 CLINICAL DATA:  Pt from home with daughter LSN yesterday at 1500. Daughter saw pt this morning slumped over in a chair to the right side. Pt has been increasingly weak over the past week. EXAM: PORTABLE CHEST 1 VIEW COMPARISON:  01/23/2019 FINDINGS: There are low lung volumes. Mild linear opacity noted at the right lung base consistent with atelectasis. Lungs otherwise clear. No pleural effusion or pneumothorax. Cardiac silhouette is normal in size. No mediastinal or hilar masses. Skeletal structures are grossly intact. IMPRESSION: No acute cardiopulmonary disease. Electronically Signed   By:  Lajean Manes M.D.   On: 10/07/2019 09:57   ECHOCARDIOGRAM COMPLETE  Result Date: 10/07/2019   ECHOCARDIOGRAM REPORT   Patient Name:   TAARIQ HARTLEY Date of Exam: 10/07/2019 Medical Rec #:  [id number]       Height:       67.0 in Accession #:    [id number]      Weight:       204.4 lb Date of Birth:  [birth year]       BSA:          2.04 m Patient Age:    2 years        BP:           129/96 mmHg Patient Gender: M  HR:           77 bpm. Exam Location:  Inpatient Procedure: 2D Echo, Cardiac Doppler and Color Doppler Indications:    CVA  History:        Patient has prior history of Echocardiogram examinations, most                 recent 01/22/2019. Stroke and PAD; Risk Factors:Hypertension,                 Current Smoker and Diabetes. ESRD.  Sonographer:    Dustin Flock Referring Phys: [place]  1. Left ventricular ejection fraction, by visual estimation, is 60 to 65%. The left ventricle has normal function. There is moderately increased left ventricular hypertrophy.  2. Left ventricular diastolic parameters are indeterminate.  3. The left ventricle has no regional wall motion abnormalities.  4. Global right ventricle has mildly reduced systolic function.The right ventricular size is normal. No increase in right ventricular wall thickness.  5. Left atrial size was normal.  6. Right atrial size was normal.  7. The mitral valve is normal in structure. Trivial mitral valve regurgitation.  8. The tricuspid valve is normal in structure.  9. The tricuspid valve is normal in structure. Tricuspid valve regurgitation is trivial. 10. The aortic valve is tricuspid. Aortic valve regurgitation is not visualized. Mild to moderate aortic valve sclerosis/calcification without any evidence of aortic stenosis. 11. The pulmonic valve was grossly normal. Pulmonic valve regurgitation is not visualized. 12. Moderately elevated pulmonary artery systolic pressure. 13. No intracardiac source of embolism  detected on this transthoracic study. A transesophageal echocardiogram is recommended to exclude cardiac source of embolism if clinically indicated. 14. The atrial septum is grossly normal. FINDINGS  Left Ventricle: Left ventricular ejection fraction, by visual estimation, is 60 to 65%. The left ventricle has normal function. The left ventricle has no regional wall motion abnormalities. There is moderately increased left ventricular hypertrophy. Concentric left ventricular hypertrophy. Left ventricular diastolic parameters are indeterminate. Right Ventricle: The right ventricular size is normal. No increase in right ventricular wall thickness. Global RV systolic function is has mildly reduced systolic function. The tricuspid regurgitant velocity is 3.49 m/s, and with an assumed right atrial pressure of 8 mmHg, the estimated right ventricular systolic pressure is moderately elevated at 56.7 mmHg. Left Atrium: Left atrial size was normal in size. Right Atrium: Right atrial size was normal in size Pericardium: There is no evidence of pericardial effusion. Mitral Valve: The mitral valve is normal in structure. There is mild thickening of the mitral valve leaflet(s). There is mild calcification of the mitral valve leaflet(s). Trivial mitral valve regurgitation. Tricuspid Valve: The tricuspid valve is normal in structure. Tricuspid valve regurgitation is trivial. Aortic Valve: The aortic valve is tricuspid. . There is mild thickening and moderate calcification of the aortic valve. Aortic valve regurgitation is not visualized. Mild to moderate aortic valve sclerosis/calcification is present, without any evidence of aortic stenosis. There is mild thickening of the aortic valve. There is moderate calcification of the aortic valve. Pulmonic Valve: The pulmonic valve was grossly normal. Pulmonic valve regurgitation is not visualized. Pulmonic regurgitation is not visualized. Aorta: The aortic root and ascending aorta are  structurally normal, with no evidence of dilitation. Pulmonary Artery: The pulmonary artery is not well seen. Venous: The inferior vena cava was not well visualized. IAS/Shunts: The atrial septum is grossly normal. Additional Comments: No intracardiac source of embolism detected on this transthoracic study. A transesophageal echocardiogram  is recommended to exclude cardiac source of embolism if clinically indicated.  LEFT VENTRICLE PLAX 2D LVIDd:         3.93 cm  Diastology LVIDs:         2.59 cm  LV e' lateral:   18.40 cm/s LV PW:         1.51 cm  LV E/e' lateral: 3.5 LV IVS:        1.69 cm  LV e' medial:    14.50 cm/s LVOT diam:     2.50 cm  LV E/e' medial:  4.4 LV SV:         43 ml LV SV Index:   20.12 LVOT Area:     4.91 cm  RIGHT VENTRICLE RV Basal diam:  3.00 cm RV S prime:     9.90 cm/s TAPSE (M-mode): 1.1 cm LEFT ATRIUM             Index       RIGHT ATRIUM           Index LA diam:        4.30 cm 2.11 cm/m  RA Area:     18.70 cm LA Vol (A2C):   37.0 ml 18.13 ml/m RA Volume:   46.30 ml  22.69 ml/m LA Vol (A4C):   52.0 ml 25.48 ml/m LA Biplane Vol: 44.3 ml 21.71 ml/m  AORTIC VALVE LVOT Vmax:   84.90 cm/s LVOT Vmean:  57.100 cm/s LVOT VTI:    0.158 m  AORTA Ao Root diam: 3.50 cm MITRAL VALVE                        TRICUSPID VALVE MV Area (PHT): 3.54 cm             TR Peak grad:   48.7 mmHg MV PHT:        62.06 msec           TR Vmax:        349.00 cm/s MV Decel Time: 214 msec MV E velocity: 63.70 cm/s 103 cm/s  SHUNTS MV A velocity: 90.00 cm/s 70.3 cm/s Systemic VTI:  0.16 m MV E/A ratio:  0.71       1.5       Systemic Diam: 2.50 cm  Buford Dresser MD Electronically signed by Buford Dresser MD Signature Date/Time: 10/07/2019/3:31:07 PM    Final    CT HEAD CODE STROKE WO CONTRAST  Result Date: 10/07/2019 CLINICAL DATA:  Code stroke. Focal neuro deficit, greater than 6 hours, stroke suspected, aphasia and facial droop. EXAM: CT HEAD WITHOUT CONTRAST TECHNIQUE: Contiguous axial images were  obtained from the base of the skull through the vertex without intravenous contrast. COMPARISON:  No pertinent prior studies available for comparison. FINDINGS: Brain: No evidence of acute intracranial hemorrhage. No demarcated cortical infarction. No evidence of intracranial mass. No midline shift or extra-axial fluid collection. Mild generalized parenchymal atrophy. Vascular: No hyperdense vessel.  Atherosclerotic calcifications. Skull: Normal. Negative for fracture or focal lesion. Sinuses/Orbits: Visualized orbits demonstrate no acute abnormality. No significant paranasal sinus disease or mastoid effusion at the imaged levels. These results were communicated to Dr. Rory Percy at [place] 1/27/2021by text page via the [facility] messaging system. IMPRESSION: No CT evidence of acute intracranial abnormality. Mild generalized parenchymal atrophy. Electronically Signed   By: Kellie Simmering DO   On: 10/07/2019 09:16   CT CEREBRAL PERFUSION W CM (CHARM STUDY)  Result Date: 10/07/2019 CLINICAL DATA:  Code stroke, follow-up EXAM: CT ANGIOGRAPHY HEAD AND NECK CT PERFUSION BRAIN TECHNIQUE: Multidetector CT imaging of the head and neck was performed using the standard protocol during bolus administration of intravenous contrast. Multiplanar CT image reconstructions and MIPs were obtained to evaluate the vascular anatomy. Carotid stenosis measurements (when applicable) are obtained utilizing NASCET criteria, using the distal internal carotid diameter as the denominator. Multiphase CT imaging of the brain was performed following IV bolus contrast injection. Subsequent parametric perfusion maps were calculated using RAPID software. CONTRAST:  113mL OMNIPAQUE IOHEXOL 350 MG/ML SOLN COMPARISON:  None. FINDINGS: CTA NECK FINDINGS Aortic arch: Great vessel origins are patent. There is right origin of the left vertebral artery from the arch, with calcified plaque noted at the origin. Right carotid system: Patent. There is calcified and  noncalcified plaque along the proximal ICA causing 75% stenosis. Left carotid system: Patent. There is calcified plaque at the ICA origin causing minimal stenosis. Vertebral arteries: Patent. Calcified plaque at both vertebral artery origins without evidence of flow limitation. Right vertebral artery is dominant. Skeleton: Degenerative changes. Other neck: No mass or adenopathy. Upper chest: No apical lung mass. Review of the MIP images confirms the above findings CTA HEAD FINDINGS Anterior circulation: Intracranial internal carotid arteries are patent with calcified plaque causing mild stenosis. Anterior and middle cerebral arteries are patent. Posterior circulation: Intracranial vertebral arteries are patent with calcified plaque causing moderate stenosis on the left. Basilar artery is patent. Posterior cerebral arteries are patent with bilateral posterior communicating arteries present. Venous sinuses: As permitted by contrast timing, patent. Review of the MIP images confirms the above findings CT Brain Perfusion Findings: CBF (<30%) Volume: 43mL Perfusion (Tmax>6.0s) volume: 52mL Mismatch Volume: 9mL Infarction Location: None IMPRESSION: No large vessel occlusion. No evidence of core infarction or territory at risk by perfusion imaging. Plaque at the proximal right ICA causes 75% stenosis. There is minimal stenosis of the left ICA origin. Electronically Signed   By: Macy Mis M.D.   On: 10/07/2019 09:41    Labs:  CBC: Recent Labs    01/28/19 0437 05/11/19 1037 10/07/19 0858 10/07/19 0902 10/08/19 0316  WBC 10.6* 10.0 10.8*  --  9.2  HGB 10.4* 12.5* 13.4 13.9 12.1*  HCT 32.6* 37.5 42.5 41.0 37.3*  PLT 113* 169 209  --  204    COAGS: Recent Labs    01/27/19 0521 10/07/19 0858  INR 1.1 1.0  APTT  --  30    BMP: Recent Labs    01/28/19 0437 01/28/19 0437 05/11/19 1037 06/10/19 1208 10/07/19 0858 10/07/19 0902 10/08/19 0316  NA 141   < > 129* 132* 137 134* 139  K 4.9   < >  4.3 4.0 4.6 4.4 4.4  CL 107   < > 84* 88* 92* 94* 102  CO2 21*   < > 22 33* 27  --  24  GLUCOSE 247*   < > 126* 245* 163* 158* 161*  BUN 61*   < > 79* 56* 54* 49* 67*  CALCIUM 8.5*   < > 9.4 10.2 9.5  --  8.6*  CREATININE 4.57*   < > 6.26* 5.70* 7.02* 7.10* 8.24*  GFRNONAA 12*  --  8*  --  7*  --  6*  GFRAA 13*  --  9*  --  8*  --  7*   < > = values in this interval not displayed.    LIVER FUNCTION TESTS: Recent Labs    01/22/19 0541 01/23/19 0349  05/11/19 1037 06/10/19 1208 10/07/19 0858 10/08/19 0316  BILITOT 0.5  --  0.3 0.4 0.4  --   AST 48*  --  63* 47* 40  --   ALT 27  --  88* 67* 24  --   ALKPHOS 68  --  168* 169* 99  --   PROT 6.4*  --  7.2 8.3 7.3  --   ALBUMIN 3.0*   < > 4.0 4.3 3.5 2.6*   < > = values in this interval not displayed.    TUMOR MARKERS: No results for input(s): AFPTM, CEA, CA199, CHROMGRNA in the last 8760 hours.  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  All questions were answered, patient and daughter are agreeable to proceed. Consent signed and in chart.   Thank you for this interesting consult.  I greatly enjoyed meeting Frank [name] and look forward to participating in their care.  A copy of this report was sent to the requesting provider on this date.  Electronically Signed: Lavonia Drafts, PA-C 10/08/2019, 11:25 AM   I spent a total of 20 Minutes    in face to face in clinical consultation, greater than 50% of which was counseling/coordinating care for left arm dialysis graft declot

## 2019-10-09 ENCOUNTER — Inpatient Hospital Stay (HOSPITAL_COMMUNITY): Payer: Medicare Other

## 2019-10-09 DIAGNOSIS — I959 Hypotension, unspecified: Secondary | ICD-10-CM

## 2019-10-09 DIAGNOSIS — I9589 Other hypotension: Secondary | ICD-10-CM

## 2019-10-09 HISTORY — PX: IR THROMBECTOMY AV FISTULA W/THROMBOLYSIS/PTA INC/SHUNT/IMG LEFT: IMG6106

## 2019-10-09 HISTORY — PX: IR US GUIDE VASC ACCESS LEFT: IMG2389

## 2019-10-09 LAB — BASIC METABOLIC PANEL
Anion gap: 15 (ref 5–15)
BUN: 37 mg/dL — ABNORMAL HIGH (ref 8–23)
CO2: 26 mmol/L (ref 22–32)
Calcium: 9 mg/dL (ref 8.9–10.3)
Chloride: 99 mmol/L (ref 98–111)
Creatinine, Ser: 5.73 mg/dL — ABNORMAL HIGH (ref 0.61–1.24)
GFR calc Af Amer: 10 mL/min — ABNORMAL LOW (ref >60)
GFR calc non Af Amer: 9 mL/min — ABNORMAL LOW (ref >60)
Glucose, Bld: 98 mg/dL (ref 70–99)
Potassium: 4.1 mmol/L (ref 3.5–5.1)
Sodium: 140 mmol/L (ref 135–145)

## 2019-10-09 LAB — CBC
HCT: 37.9 % — ABNORMAL LOW (ref 39.0–52.0)
Hemoglobin: 12 g/dL — ABNORMAL LOW (ref 13.0–17.0)
MCH: 32.3 pg (ref 26.0–34.0)
MCHC: 31.7 g/dL (ref 30.0–36.0)
MCV: 101.9 fL — ABNORMAL HIGH (ref 80.0–100.0)
Platelets: 175 10*3/uL (ref 150–400)
RBC: 3.72 MIL/uL — ABNORMAL LOW (ref 4.22–5.81)
RDW: 13.2 % (ref 11.5–15.5)
WBC: 10.7 10*3/uL — ABNORMAL HIGH (ref 4.0–10.5)
nRBC: 0 % (ref 0.0–0.2)

## 2019-10-09 LAB — PLATELET INHIBITION P2Y12: Platelet Function  P2Y12: 272 [PRU] (ref 182–335)

## 2019-10-09 LAB — HEPATITIS B SURFACE ANTIGEN: Hepatitis B Surface Ag: NONREACTIVE

## 2019-10-09 MED ORDER — ALTEPLASE 2 MG IJ SOLR
INTRAMUSCULAR | Status: AC
  Filled 2019-10-09: qty 4

## 2019-10-09 MED ORDER — IOHEXOL 300 MG/ML  SOLN
100.0000 mL | Freq: Once | INTRAMUSCULAR | Status: AC | PRN
  Administered 2019-10-09: 16:00:00 35 mL via INTRAVENOUS

## 2019-10-09 MED ORDER — ASPIRIN EC 81 MG PO TBEC
81.0000 mg | DELAYED_RELEASE_TABLET | Freq: Every day | ORAL | Status: DC
  Administered 2019-10-10 – 2019-10-13 (×4): 81 mg via ORAL
  Filled 2019-10-09 (×4): qty 1

## 2019-10-09 MED ORDER — ALTEPLASE 2 MG IJ SOLR
INTRAMUSCULAR | Status: AC | PRN
  Administered 2019-10-09: 4 mg

## 2019-10-09 MED ORDER — SODIUM CHLORIDE 0.9% FLUSH
10.0000 mL | Freq: Two times a day (BID) | INTRAVENOUS | Status: DC
  Administered 2019-10-09 – 2019-10-24 (×23): 10 mL

## 2019-10-09 MED ORDER — HEPARIN SODIUM (PORCINE) 1000 UNIT/ML IJ SOLN
INTRAMUSCULAR | Status: AC | PRN
  Administered 2019-10-09: 4000 [IU] via INTRAVENOUS

## 2019-10-09 MED ORDER — LIDOCAINE HCL 1 % IJ SOLN
INTRAMUSCULAR | Status: AC
  Filled 2019-10-09: qty 20

## 2019-10-09 MED ORDER — LIDOCAINE HCL 1 % IJ SOLN
INTRAMUSCULAR | Status: AC | PRN
  Administered 2019-10-09: 5 mL

## 2019-10-09 MED ORDER — MIDAZOLAM HCL 2 MG/2ML IJ SOLN
INTRAMUSCULAR | Status: AC | PRN
  Administered 2019-10-09: 1 mg via INTRAVENOUS

## 2019-10-09 MED ORDER — SODIUM CHLORIDE 0.9% FLUSH
10.0000 mL | INTRAVENOUS | Status: DC | PRN
  Administered 2019-10-17: 10 mL

## 2019-10-09 MED ORDER — TICAGRELOR 90 MG PO TABS
90.0000 mg | ORAL_TABLET | Freq: Two times a day (BID) | ORAL | Status: AC
  Administered 2019-10-10 – 2019-10-13 (×8): 90 mg via ORAL
  Filled 2019-10-09 (×8): qty 1

## 2019-10-09 MED ORDER — CHLORHEXIDINE GLUCONATE CLOTH 2 % EX PADS
6.0000 | MEDICATED_PAD | Freq: Every day | CUTANEOUS | Status: DC
  Administered 2019-10-14 – 2019-10-15 (×2): 6 via TOPICAL

## 2019-10-09 MED ORDER — SODIUM CHLORIDE 0.9 % IV SOLN
INTRAVENOUS | Status: AC | PRN
  Administered 2019-10-09: 10 mL/h via INTRAVENOUS

## 2019-10-09 MED ORDER — FENTANYL CITRATE (PF) 100 MCG/2ML IJ SOLN
INTRAMUSCULAR | Status: AC
  Filled 2019-10-09: qty 2

## 2019-10-09 MED ORDER — HEPARIN SODIUM (PORCINE) 1000 UNIT/ML IJ SOLN
INTRAMUSCULAR | Status: AC
  Filled 2019-10-09: qty 1

## 2019-10-09 MED ORDER — FENTANYL CITRATE (PF) 100 MCG/2ML IJ SOLN
INTRAMUSCULAR | Status: AC | PRN
  Administered 2019-10-09: 50 ug via INTRAVENOUS

## 2019-10-09 MED ORDER — MIDAZOLAM HCL 2 MG/2ML IJ SOLN
INTRAMUSCULAR | Status: AC
  Filled 2019-10-09: qty 2

## 2019-10-09 NOTE — Progress Notes (Signed)
Interventional MD notified of patient bleeding from the lower AV Fistula repair site. Patient began to have spontaneous bleeding from site and manual pressure was immediately applied. MD stated to remove dressing and tighten the incision site by turning the stick located at the site. Stick was tightened clockwise  turn. Hemostasis was achieved about 5 minutes after the site began to bleed. The site was then cleaned and redressed.

## 2019-10-09 NOTE — Procedures (Signed)
Pre procedural Dx: ESRD Post procedural Dx: Same.   Technically successful declot of left upper upper arm dialysis graft.   Access is ready for immediate use.  EBL: Trace  Complications: None immediate   Ronny Bacon, MD Pager #: [phone]

## 2019-10-09 NOTE — Progress Notes (Addendum)
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 Patient awake and alert laying in bed. Complains of genital pain relieved by flatulence.   Allergies: Ace inhibitors, Angiotensin receptor blockers, Ivp dye [iodinated diagnostic agents], and Adhesive [tape]  Medications: Prior to Admission medications   Medication Sig Start Date End Date Taking? Authorizing Provider  allopurinol (ZYLOPRIM) 100 MG tablet Take 1 tablet (100 mg total) by mouth daily. 06/03/19  Yes McLean-Scocuzza, Nino Glow, MD  aspirin EC 81 MG tablet Take 81 mg daily by mouth.   Yes [provider]  atorvastatin (LIPITOR) 20 MG tablet Take 20 mg by mouth daily at 6 PM.    Yes [provider]  calcitRIOL (ROCALTROL) 0.5 MCG capsule Take 0.5 mcg by mouth See admin instructions. Only on dialysis days: Tuesday, Thursdays and Saturdays.   Yes [provider]  docusate sodium (COLACE) 100 MG capsule Take 200 mg by mouth daily as needed.    Yes [provider]  gabapentin (NEURONTIN) 300 MG capsule Take 1 capsule (300 mg total) by mouth daily. At 3 PM 05/08/19  Yes McLean-Scocuzza, Nino Glow, MD  Influenza Vac A&B Surf Ant Adj (FLUAD IM) Inject 1 each into the muscle once. High dose   Yes [provider]  ketoconazole (NIZORAL) 2 % shampoo Apply 1 application topically 2 (two) times a week. Patient taking differently: Apply 1 application topically as needed for irritation.  06/11/19  Yes McLean-Scocuzza, Nino Glow, MD  levothyroxine (SYNTHROID) 150 MCG tablet Take 150 mcg by mouth daily before breakfast.   Yes [provider]  lidocaine-prilocaine (EMLA) cream Apply 1 application topically See admin instructions. Apply to access  site 1 to 2 hours before dialysis. Cover with occlusive dressing. 04/08/19  Yes [provider]  midodrine (PROAMATINE) 10 MG tablet Take 1 tablet (10 mg total) by mouth 3 (three) times a week. Take within 1/2 hour prior to dialysis start 09/16/19  Yes Baldwin [place], PA-C  multivitamin (RENA-VIT) TABS tablet Take 1 tablet daily by mouth.   Yes [provider]  ondansetron (ZOFRAN) 4 MG tablet Take 4 mg by mouth every 8 (eight) hours as needed for nausea or vomiting.  01/19/19  Yes [provider]  PRESCRIPTION MEDICATION Take 1 oz by mouth See admin instructions. Liquid protein: Liquifil on dialisys days, Tuesday, Thursday and Saturday.   Yes [provider]  sevelamer carbonate (RENVELA) 2.4 g PACK Take by mouth 3 (three) times daily with meals. And with any snacks    Yes [provider]  Zinc Oxide (BALMEX EX) Apply 1 application topically daily as needed (to irritated areas of skin).    Yes [provider]     Vital Signs: BP 138/84    Pulse 69    Temp 98 F (36.7 C) (Oral)    Resp (!) 22    Ht 5\' 7"  (1.702 m)    Wt 216 lb 4.3 oz (98.1 kg)    SpO2 96%    BMI 33.87 kg/m   Physical Exam Vitals and nursing note reviewed.  Constitutional:      General: He is not in acute distress.    Appearance: Normal appearance.  Pulmonary:     Effort: Pulmonary effort is normal. No respiratory distress.  Skin:  General: Skin is warm and dry.  Neurological:     Mental Status: He is alert and oriented to person, place, and time.  Psychiatric:        Mood and Affect: Mood normal.        Behavior: Behavior normal.     Imaging: CT ANGIO HEAD W OR WO CONTRAST  Result Date: 10/07/2019 CLINICAL DATA:  Code stroke, follow-up EXAM: CT ANGIOGRAPHY HEAD AND NECK CT PERFUSION BRAIN TECHNIQUE: Multidetector CT imaging of the head and neck was performed using the standard protocol during bolus administration of intravenous contrast. Multiplanar CT image  reconstructions and MIPs were obtained to evaluate the vascular anatomy. Carotid stenosis measurements (when applicable) are obtained utilizing NASCET criteria, using the distal internal carotid diameter as the denominator. Multiphase CT imaging of the brain was performed following IV bolus contrast injection. Subsequent parametric perfusion maps were calculated using RAPID software. CONTRAST:  180mL OMNIPAQUE IOHEXOL 350 MG/ML SOLN COMPARISON:  None. FINDINGS: CTA NECK FINDINGS Aortic arch: Great vessel origins are patent. There is right origin of the left vertebral artery from the arch, with calcified plaque noted at the origin. Right carotid system: Patent. There is calcified and noncalcified plaque along the proximal ICA causing 75% stenosis. Left carotid system: Patent. There is calcified plaque at the ICA origin causing minimal stenosis. Vertebral arteries: Patent. Calcified plaque at both vertebral artery origins without evidence of flow limitation. Right vertebral artery is dominant. Skeleton: Degenerative changes. Other neck: No mass or adenopathy. Upper chest: No apical lung mass. Review of the MIP images confirms the above findings CTA HEAD FINDINGS Anterior circulation: Intracranial internal carotid arteries are patent with calcified plaque causing mild stenosis. Anterior and middle cerebral arteries are patent. Posterior circulation: Intracranial vertebral arteries are patent with calcified plaque causing moderate stenosis on the left. Basilar artery is patent. Posterior cerebral arteries are patent with bilateral posterior communicating arteries present. Venous sinuses: As permitted by contrast timing, patent. Review of the MIP images confirms the above findings CT Brain Perfusion Findings: CBF (<30%) Volume: 58mL Perfusion (Tmax>6.0s) volume: 71mL Mismatch Volume: 75mL Infarction Location: None IMPRESSION: No large vessel occlusion. No evidence of core infarction or territory at risk by perfusion  imaging. Plaque at the proximal right ICA causes 75% stenosis. There is minimal stenosis of the left ICA origin. Electronically Signed   By: Macy Mis M.D.   On: 10/07/2019 09:41   CT ANGIO NECK W OR WO CONTRAST  Result Date: 10/07/2019 CLINICAL DATA:  Code stroke, follow-up EXAM: CT ANGIOGRAPHY HEAD AND NECK CT PERFUSION BRAIN TECHNIQUE: Multidetector CT imaging of the head and neck was performed using the standard protocol during bolus administration of intravenous contrast. Multiplanar CT image reconstructions and MIPs were obtained to evaluate the vascular anatomy. Carotid stenosis measurements (when applicable) are obtained utilizing NASCET criteria, using the distal internal carotid diameter as the denominator. Multiphase CT imaging of the brain was performed following IV bolus contrast injection. Subsequent parametric perfusion maps were calculated using RAPID software. CONTRAST:  163mL OMNIPAQUE IOHEXOL 350 MG/ML SOLN COMPARISON:  None. FINDINGS: CTA NECK FINDINGS Aortic arch: Great vessel origins are patent. There is right origin of the left vertebral artery from the arch, with calcified plaque noted at the origin. Right carotid system: Patent. There is calcified and noncalcified plaque along the proximal ICA causing 75% stenosis. Left carotid system: Patent. There is calcified plaque at the ICA origin causing minimal stenosis. Vertebral arteries: Patent. Calcified plaque at both vertebral artery origins without evidence  of flow limitation. Right vertebral artery is dominant. Skeleton: Degenerative changes. Other neck: No mass or adenopathy. Upper chest: No apical lung mass. Review of the MIP images confirms the above findings CTA HEAD FINDINGS Anterior circulation: Intracranial internal carotid arteries are patent with calcified plaque causing mild stenosis. Anterior and middle cerebral arteries are patent. Posterior circulation: Intracranial vertebral arteries are patent with calcified plaque  causing moderate stenosis on the left. Basilar artery is patent. Posterior cerebral arteries are patent with bilateral posterior communicating arteries present. Venous sinuses: As permitted by contrast timing, patent. Review of the MIP images confirms the above findings CT Brain Perfusion Findings: CBF (<30%) Volume: 35mL Perfusion (Tmax>6.0s) volume: 20mL Mismatch Volume: 37mL Infarction Location: None IMPRESSION: No large vessel occlusion. No evidence of core infarction or territory at risk by perfusion imaging. Plaque at the proximal right ICA causes 75% stenosis. There is minimal stenosis of the left ICA origin. Electronically Signed   By: Macy Mis M.D.   On: 10/07/2019 09:41   MR BRAIN WO CONTRAST  Result Date: 10/07/2019 CLINICAL DATA:  Initial evaluation for acute weakness, facial droop, slurred speech. Concern for stroke. EXAM: MRI HEAD WITHOUT CONTRAST TECHNIQUE: Multiplanar, multiecho pulse sequences of the brain and surrounding structures were obtained without intravenous contrast. COMPARISON:  Prior CTs from earlier the same day. FINDINGS: Brain: Moderately advanced age-related cerebral atrophy. Patchy T2/FLAIR hyperintensity within the periventricular white matter most consistent with chronic small vessel ischemic disease, mild in nature. Multifocal areas of restricted diffusion seen involving the periventricular/periatrial white matter of both cerebral hemispheres, consistent with acute ischemic small vessel type infarcts. Overall, infarct burden slightly worse on the right as compared to the left. For reference purposes, largest area of ischemia measures approximately 12 mm at the posterior right frontal periventricular white matter. No associated hemorrhage or mass effect. No acute cortically based infarct. Gray-white matter differentiation otherwise maintained. No areas of remote cortical infarction. No acute intracranial hemorrhage. Single punctate chronic microhemorrhage noted at the right  external capsule, likely related to small vessel disease. No other evidence for chronic intracranial hemorrhage. No mass lesion, midline shift or mass effect. No hydrocephalus. No extra-axial fluid collection. Incidental note made of a partially empty sella. Midline structures intact. Vascular: Major intracranial vascular flow voids are maintained. Skull and upper cervical spine: Craniocervical junction within normal limits. Bone marrow signal intensity normal. No scalp soft tissue abnormality. Sinuses/Orbits: Patient status post bilateral ocular lens replacement. Mild scattered mucosal thickening noted within the ethmoidal air cells. Paranasal sinuses are otherwise clear. No mastoid effusion. Inner ear structures grossly normal. Other: None. IMPRESSION: 1. 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 No associated hemorrhage or mass effect. 2. Underlying moderately advanced age-related cerebral atrophy with mild chronic small vessel ischemic disease. Electronically Signed   By: Jeannine Boga M.D.   On: 10/07/2019 20:34   IR Fluoro Guide CV Line Right  Result Date: 10/08/2019 INDICATION: 79 year old with end-stage renal disease. Patient has a clotted left arm graft and will need dialysis prior to the declot procedure. EXAM: FLUOROSCOPIC AND ULTRASOUND GUIDED PLACEMENT OF A NON-TUNNELED DIALYSIS CATHETER Physician: Stephan Minister. Henn, MD MEDICATIONS: None ANESTHESIA/SEDATION: None FLUOROSCOPY TIME:  Fluoroscopy Time: 12 seconds, 2 mGy COMPLICATIONS: None immediate. PROCEDURE: The procedure was explained to the patient. The risks and benefits of the procedure were discussed and the patient's questions were addressed. Informed consent was obtained from the patient. The patient was placed supine  on the interventional table. Ultrasound confirmed a patent right internal jugular vein. Ultrasound images  were obtained for documentation. The right neck was prepped and draped in a sterile fashion. The right neck was anesthetized with 1% lidocaine. Maximal barrier sterile technique was utilized including caps, mask, sterile gowns, sterile gloves, sterile drape, hand hygiene and skin antiseptic. A small incision was made with #11 blade scalpel. A 21 gauge needle directed into the right internal jugular vein with ultrasound guidance. A micropuncture dilator set was placed. A 16 cm Mahurkar catheter was selected. The catheter was advanced over a wire and positioned at the superior cavoatrial junction. Fluoroscopic images were obtained for documentation. Both dialysis lumens were found to aspirate and flush well. The proper amount of heparin was flushed in both lumens. The central venous lumen was flushed with normal saline. Catheter was sutured to skin. FINDINGS: Catheter tip at the superior cavoatrial junction. IMPRESSION: Successful placement of a right jugular non-tunneled dialysis catheter using ultrasound and fluoroscopic guidance. Electronically Signed   By: Markus Daft M.D.   On: 10/08/2019 17:18   IR US Guide Vasc Access Right  Result Date: 10/08/2019 INDICATION: 79 year old with end-stage renal disease. Patient has a clotted left arm graft and will need dialysis prior to the declot procedure. EXAM: FLUOROSCOPIC AND ULTRASOUND GUIDED PLACEMENT OF A NON-TUNNELED DIALYSIS CATHETER Physician: Stephan Minister. Henn, MD MEDICATIONS: None ANESTHESIA/SEDATION: None FLUOROSCOPY TIME:  Fluoroscopy Time: 12 seconds, 2 mGy COMPLICATIONS: None immediate. PROCEDURE: The procedure was explained to the patient. The risks and benefits of the procedure were discussed and the patient's questions were addressed. Informed consent was obtained from the patient. The patient was placed supine on the interventional table. Ultrasound confirmed a patent right internal jugular vein. Ultrasound images were obtained for documentation. The right  neck was prepped and draped in a sterile fashion. The right neck was anesthetized with 1% lidocaine. Maximal barrier sterile technique was utilized including caps, mask, sterile gowns, sterile gloves, sterile drape, hand hygiene and skin antiseptic. A small incision was made with #11 blade scalpel. A 21 gauge needle directed into the right internal jugular vein with ultrasound guidance. A micropuncture dilator set was placed. A 16 cm Mahurkar catheter was selected. The catheter was advanced over a wire and positioned at the superior cavoatrial junction. Fluoroscopic images were obtained for documentation. Both dialysis lumens were found to aspirate and flush well. The proper amount of heparin was flushed in both lumens. The central venous lumen was flushed with normal saline. Catheter was sutured to skin. FINDINGS: Catheter tip at the superior cavoatrial junction. IMPRESSION: Successful placement of a right jugular non-tunneled dialysis catheter using ultrasound and fluoroscopic guidance. Electronically Signed   By: Markus Daft M.D.   On: 10/08/2019 17:18   DG Chest Portable 1 View  Result Date: 10/07/2019 CLINICAL DATA:  Pt from home with daughter LSN yesterday at 1500. Daughter saw pt this morning slumped over in a chair to the right side. Pt has been increasingly weak over the past week. EXAM: PORTABLE CHEST 1 VIEW COMPARISON:  01/23/2019 FINDINGS: There are low lung volumes. Mild linear opacity noted at the right lung base consistent with atelectasis. Lungs otherwise clear. No pleural effusion or pneumothorax. Cardiac silhouette is normal in size. No mediastinal or hilar masses. Skeletal structures are grossly intact. IMPRESSION: No acute cardiopulmonary disease. Electronically Signed   By: Lajean Manes M.D.   On: 10/07/2019 09:57   ECHOCARDIOGRAM COMPLETE  Result Date: 10/07/2019   ECHOCARDIOGRAM REPORT  Patient Name:   Frank [name] Date of Exam: 10/07/2019 Medical Rec #:  [id number]       Height:        67.0 in Accession #:    [id number]      Weight:       204.4 lb Date of Birth:  [birth year]       BSA:          2.04 m Patient Age:    79 years        BP:           129/96 mmHg Patient Gender: M               HR:           77 bpm. Exam Location:  Inpatient Procedure: 2D Echo, Cardiac Doppler and Color Doppler Indications:    CVA  History:        Patient has prior history of Echocardiogram examinations, most                 recent 01/22/2019. Stroke and PAD; Risk Factors:Hypertension,                 Current Smoker and Diabetes. ESRD.  Sonographer:    Dustin Flock Referring Phys: [place]  1. Left ventricular ejection fraction, by visual estimation, is 60 to 65%. The left ventricle has normal function. There is moderately increased left ventricular hypertrophy.  2. Left ventricular diastolic parameters are indeterminate.  3. The left ventricle has no regional wall motion abnormalities.  4. Global right ventricle has mildly reduced systolic function.The right ventricular size is normal. No increase in right ventricular wall thickness.  5. Left atrial size was normal.  6. Right atrial size was normal.  7. The mitral valve is normal in structure. Trivial mitral valve regurgitation.  8. The tricuspid valve is normal in structure.  9. The tricuspid valve is normal in structure. Tricuspid valve regurgitation is trivial. 10. The aortic valve is tricuspid. Aortic valve regurgitation is not visualized. Mild to moderate aortic valve sclerosis/calcification without any evidence of aortic stenosis. 11. The pulmonic valve was grossly normal. Pulmonic valve regurgitation is not visualized. 12. Moderately elevated pulmonary artery systolic pressure. 13. No intracardiac source of embolism detected on this transthoracic study. A transesophageal echocardiogram is recommended to exclude cardiac source of embolism if clinically indicated. 14. The atrial septum is grossly normal. FINDINGS  Left Ventricle: Left  ventricular ejection fraction, by visual estimation, is 60 to 65%. The left ventricle has normal function. The left ventricle has no regional wall motion abnormalities. There is moderately increased left ventricular hypertrophy. Concentric left ventricular hypertrophy. Left ventricular diastolic parameters are indeterminate. Right Ventricle: The right ventricular size is normal. No increase in right ventricular wall thickness. Global RV systolic function is has mildly reduced systolic function. The tricuspid regurgitant velocity is 3.49 m/s, and with an assumed right atrial pressure of 8 mmHg, the estimated right ventricular systolic pressure is moderately elevated at 56.7 mmHg. Left Atrium: Left atrial size was normal in size. Right Atrium: Right atrial size was normal in size Pericardium: There is no evidence of pericardial effusion. Mitral Valve: The mitral valve is normal in structure. There is mild thickening of the mitral valve leaflet(s). There is mild calcification of the mitral valve leaflet(s). Trivial mitral valve regurgitation. Tricuspid Valve: The tricuspid valve is normal in structure. Tricuspid valve regurgitation is trivial. Aortic Valve: The aortic valve is tricuspid. . There is mild  thickening and moderate calcification of the aortic valve. Aortic valve regurgitation is not visualized. Mild to moderate aortic valve sclerosis/calcification is present, without any evidence of aortic stenosis. There is mild thickening of the aortic valve. There is moderate calcification of the aortic valve. Pulmonic Valve: The pulmonic valve was grossly normal. Pulmonic valve regurgitation is not visualized. Pulmonic regurgitation is not visualized. Aorta: The aortic root and ascending aorta are structurally normal, with no evidence of dilitation. Pulmonary Artery: The pulmonary artery is not well seen. Venous: The inferior vena cava was not well visualized. IAS/Shunts: The atrial septum is grossly normal. Additional  Comments: No intracardiac source of embolism detected on this transthoracic study. A transesophageal echocardiogram is recommended to exclude cardiac source of embolism if clinically indicated.  LEFT VENTRICLE PLAX 2D LVIDd:         3.93 cm  Diastology LVIDs:         2.59 cm  LV e' lateral:   18.40 cm/s LV PW:         1.51 cm  LV E/e' lateral: 3.5 LV IVS:        1.69 cm  LV e' medial:    14.50 cm/s LVOT diam:     2.50 cm  LV E/e' medial:  4.4 LV SV:         43 ml LV SV Index:   20.12 LVOT Area:     4.91 cm  RIGHT VENTRICLE RV Basal diam:  3.00 cm RV S prime:     9.90 cm/s TAPSE (M-mode): 1.1 cm LEFT ATRIUM             Index       RIGHT ATRIUM           Index LA diam:        4.30 cm 2.11 cm/m  RA Area:     18.70 cm LA Vol (A2C):   37.0 ml 18.13 ml/m RA Volume:   46.30 ml  22.69 ml/m LA Vol (A4C):   52.0 ml 25.48 ml/m LA Biplane Vol: 44.3 ml 21.71 ml/m  AORTIC VALVE LVOT Vmax:   84.90 cm/s LVOT Vmean:  57.100 cm/s LVOT VTI:    0.158 m  AORTA Ao Root diam: 3.50 cm MITRAL VALVE                        TRICUSPID VALVE MV Area (PHT): 3.54 cm             TR Peak grad:   48.7 mmHg MV PHT:        62.06 msec           TR Vmax:        349.00 cm/s MV Decel Time: 214 msec MV E velocity: 63.70 cm/s 103 cm/s  SHUNTS MV A velocity: 90.00 cm/s 70.3 cm/s Systemic VTI:  0.16 m MV E/A ratio:  0.71       1.5       Systemic Diam: 2.50 cm  Buford Dresser MD Electronically signed by Buford Dresser MD Signature Date/Time: 10/07/2019/3:31:07 PM    Final    CT HEAD CODE STROKE WO CONTRAST  Result Date: 10/07/2019 CLINICAL DATA:  Code stroke. Focal neuro deficit, greater than 6 hours, stroke suspected, aphasia and facial droop. EXAM: CT HEAD WITHOUT CONTRAST TECHNIQUE: Contiguous axial images were obtained from the base of the skull through the vertex without intravenous contrast. COMPARISON:  No pertinent prior studies available for comparison. FINDINGS: Brain: No evidence  of acute intracranial hemorrhage. No  demarcated cortical infarction. No evidence of intracranial mass. No midline shift or extra-axial fluid collection. Mild generalized parenchymal atrophy. Vascular: No hyperdense vessel.  Atherosclerotic calcifications. Skull: Normal. Negative for fracture or focal lesion. Sinuses/Orbits: Visualized orbits demonstrate no acute abnormality. No significant paranasal sinus disease or mastoid effusion at the imaged levels. These results were communicated to Dr. Rory Percy at [place] 1/27/2021by text page via the [facility] messaging system. IMPRESSION: No CT evidence of acute intracranial abnormality. Mild generalized parenchymal atrophy. Electronically Signed   By: Kellie Simmering DO   On: 10/07/2019 09:16   CT CEREBRAL PERFUSION W CM (CHARM STUDY)  Result Date: 10/07/2019 CLINICAL DATA:  Code stroke, follow-up EXAM: CT ANGIOGRAPHY HEAD AND NECK CT PERFUSION BRAIN TECHNIQUE: Multidetector CT imaging of the head and neck was performed using the standard protocol during bolus administration of intravenous contrast. Multiplanar CT image reconstructions and MIPs were obtained to evaluate the vascular anatomy. Carotid stenosis measurements (when applicable) are obtained utilizing NASCET criteria, using the distal internal carotid diameter as the denominator. Multiphase CT imaging of the brain was performed following IV bolus contrast injection. Subsequent parametric perfusion maps were calculated using RAPID software. CONTRAST:  120mL OMNIPAQUE IOHEXOL 350 MG/ML SOLN COMPARISON:  None. FINDINGS: CTA NECK FINDINGS Aortic arch: Great vessel origins are patent. There is right origin of the left vertebral artery from the arch, with calcified plaque noted at the origin. Right carotid system: Patent. There is calcified and noncalcified plaque along the proximal ICA causing 75% stenosis. Left carotid system: Patent. There is calcified plaque at the ICA origin causing minimal stenosis. Vertebral arteries: Patent. Calcified plaque at both  vertebral artery origins without evidence of flow limitation. Right vertebral artery is dominant. Skeleton: Degenerative changes. Other neck: No mass or adenopathy. Upper chest: No apical lung mass. Review of the MIP images confirms the above findings CTA HEAD FINDINGS Anterior circulation: Intracranial internal carotid arteries are patent with calcified plaque causing mild stenosis. Anterior and middle cerebral arteries are patent. Posterior circulation: Intracranial vertebral arteries are patent with calcified plaque causing moderate stenosis on the left. Basilar artery is patent. Posterior cerebral arteries are patent with bilateral posterior communicating arteries present. Venous sinuses: As permitted by contrast timing, patent. Review of the MIP images confirms the above findings CT Brain Perfusion Findings: CBF (<30%) Volume: 14mL Perfusion (Tmax>6.0s) volume: 94mL Mismatch Volume: 85mL Infarction Location: None IMPRESSION: No large vessel occlusion. No evidence of core infarction or territory at risk by perfusion imaging. Plaque at the proximal right ICA causes 75% stenosis. There is minimal stenosis of the left ICA origin. Electronically Signed   By: Macy Mis M.D.   On: 10/07/2019 09:41    Labs:  CBC: Recent Labs    01/28/19 0437 05/11/19 1037 10/07/19 0858 10/07/19 0902 10/08/19 0316  WBC 10.6* 10.0 10.8*  --  9.2  HGB 10.4* 12.5* 13.4 13.9 12.1*  HCT 32.6* 37.5 42.5 41.0 37.3*  PLT 113* 169 209  --  204    COAGS: Recent Labs    01/27/19 0521 10/07/19 0858  INR 1.1 1.0  APTT  --  30    BMP: Recent Labs    01/28/19 0437 01/28/19 0437 05/11/19 1037 06/10/19 1208 10/07/19 0858 10/07/19 0902 10/08/19 0316  NA 141   < > 129* 132* 137 134* 139  K 4.9   < > 4.3 4.0 4.6 4.4 4.4  CL 107   < > 84* 88* 92* 94* 102  CO2  21*   < > 22 33* 27  --  24  GLUCOSE 247*   < > 126* 245* 163* 158* 161*  BUN 61*   < > 79* 56* 54* 49* 67*  CALCIUM 8.5*   < > 9.4 10.2 9.5  --  8.6*    CREATININE 4.57*   < > 6.26* 5.70* 7.02* 7.10* 8.24*  GFRNONAA 12*  --  8*  --  7*  --  6*  GFRAA 13*  --  9*  --  8*  --  7*   < > = values in this interval not displayed.    LIVER FUNCTION TESTS: Recent Labs    01/22/19 0541 01/23/19 0349 05/11/19 1037 06/10/19 1208 10/07/19 0858 10/08/19 0316  BILITOT 0.5  --  0.3 0.4 0.4  --   AST 48*  --  63* 47* 40  --   ALT 27  --  88* 67* 24  --   ALKPHOS 68  --  168* 169* 99  --   PROT 6.4*  --  7.2 8.3 7.3  --   ALBUMIN 3.0*   < > 4.0 4.3 3.5 2.6*   < > = values in this interval not displayed.    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 NIR consulted by Dr. Erlinda Hong for management of proximal right ICA stenosis. Case was discussed between Dr. Erlinda Hong and Dr. Estanislado Pandy, and it was recommended that patient undergo endovascular revascularization of proximal right ICA stenosis in hopes to prevent patient from having future strokes. Plan for image-guided cerebral arteriogram with possible revascularization (angiplasty/stent placement) of proximal right ICA stenosis tentatively for Wednesday 10/14/2019 with Dr. Estanislado Pandy. Continue taking Plavix 75 mg once daily and Aspirin 325 mg once daily- will check P2Y12 today to check effectiveness of DAPT. Additional orders to follow next week. Further plans per CCM/neurology/nephrology- appreciate and agree with management. NIR to follow.  Risks and benefits of cerebral arteriogram with intervention were discussed with the patient including, but not limited to bleeding, infection, vascular injury, contrast induced renal failure, stroke, reperfusion hemorrhage, or even death. This interventional procedure involves the use of X-rays and because of the nature of the planned procedure, it is possible that we will have prolonged use of X-ray fluoroscopy. Potential radiation risks to you include  (but are not limited to) the following: - A slightly elevated risk for cancer  several years later in life. This risk is typically less than 0.5% percent. This risk is low in comparison to the normal incidence of human cancer, which is 33% for women and 50% for men according to the [employer]. - Radiation induced injury can include skin redness, resembling a rash, tissue breakdown / ulcers and hair loss (which can be temporary or permanent).  The likelihood of either of these occurring depends on the difficulty of the procedure and whether you are sensitive to radiation due to previous procedures, disease, or genetic conditions.  IF your procedure requires a prolonged use of radiation, you will be notified and given written instructions for further action.  It is your responsibility to monitor the irradiated area for the 2 weeks following the procedure and to notify your physician if you are concerned that you have suffered a radiation induced injury.  All of the patient's questions were answered, patient is agreeable to proceed. Consent signed and in chart.  Called patient's daughter,  Adelfa Koh, to update on above plans. All questions answered and concerns addressed.   ADDENDUM: P2Y12 272 PRU today. Discussed with Dr. Estanislado Pandy who recommends the following medication changes: discontinue Plavix/Aspirin 325 mg use, tomorrow begin taking Brilinta 90 mg twice daily and Aspirin 81 mg once daily. Will recheck P2Y12 on Monday 10/12/2019. Above orders have been placed.   Electronically Signed: Earley Abide, PA-C 10/09/2019, 11:58 AM   I spent a total of 35 Minutes at the the patient's bedside AND on the patient's hospital floor or unit, greater than 50% of which was counseling/coordinating care for proximal right ICA stenosis.

## 2019-10-09 NOTE — Progress Notes (Signed)
NAME:  Frank [name], MRN:  [id number], DOB:  1940/09/27, LOS: 2 ADMISSION DATE:  10/07/2019, CONSULTATION DATE:  10/07/19 REFERRING MD:  Dr. Rory Percy, Neuro, CHIEF COMPLAINT:  Facial droop   Brief History   79 yo male former smoker presented to ER with weakness, facial droop and slurred speech with concern for CVA.  Seen by neurology.  Outside time window for thrombolytic therapy.  Noted to have low blood pressure and started on phenylephrine.  PCCM asked to admit to ICU.  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 No associated hemorrhage or masseffect.  Micro Data:  SARS CoV2 PCR 1/27 >> negative Influenza PCR 1/27 >> A and B negative  Antimicrobials:    Interim history/subjective:  Patient reports intermittent penial pain with associated urge to void with a history of ESRD. RN reports no acute events overnight   Objective   Blood pressure 138/84, pulse 69, temperature 98.3 F (36.8 C), temperature source Oral, resp. rate (!) 22, height 5\' 7"  (1.702 m), weight 98.1 kg, SpO2 96 %.        Intake/Output Summary (Last 24 hours) at 10/09/2019 1004 Last data filed at 10/09/2019 0912 Gross per 24 hour  Intake 1182.09 ml  Output 1600 ml  Net -417.91 ml   Filed Weights   10/07/19 1230 10/08/19 2124  Weight: 92.7 kg 98.1 kg    Examination: General: Chronically ill appearing elderly male, in NAD HEENT: [place]/AT, MM pink/moist, PERRL,  Neuro: Alert and oriented, bilateral upper extremity strength 5/5 very faintly weaker on left.  Bilateral LE 4/5 strength  CV: s1s2 regular rate and rhythm, no murmur, rubs, or gallops,  PULM:  Clear to ascultation bilaterally, no increased work of breathing  GI: soft, bowel sounds active in all 4 quadrants, non-tender, non-distended Extremities: warm/dry, no edema  Skin: no rashes or lesions  Resolved Hospital Problem list     Assessment & Plan:   Multiple Bilateral scattered watershed infarcts R>Lsecondary to hypoperfusion in the setting of chronic hypotensino and right ICA stenosis  - found to have Rt ICA stenosis - Watershed infarcts related to carotid stenosis P: Neurology following appreciate assistance  Close monitoring of neurologic and hemodynamic status  ECHO with preserved EF, Diastolic function indeterminate  A1C 6.1 Lipid profile WNL Continue ASA and Plavix  Plan for probable cerebral arteriogram with possible R carotid artery intervention early next week  Chronic hypotension.- sbp 100 at home per patient P: Continue home Midodrine  Neurology recommends SBP goal 120/140 Continue Phenylephrine drip to maintain SBP goal  BP monitoring in ICU setting   DM nephropathy with ESRD. - IR consulted to place non-tunned HD cath due to clotted left arm access  P: Nephrology follow  iHD per nephrology   DM poorly controlled DM neuropathy P: SSI  Monitor  Hold home Neurontin   Hx of hypothyroidism. -TSH WNL P: Continue home Synthroid when able to take oral meds  Hx of gout. P: Continue home Allopurinol when able to take oral meds   Dysphagia.  P: Speech to assess   Best practice:  Diet: NPO DVT  prophylaxis: SQ heparin GI prophylaxis: not indicated Mobility: bed rest Code Status: full code Disposition: ICU Updates: patient and d/w DR Erlinda Hong at bedside   LABS    PULMONARY Recent Labs  Lab 10/07/19 0902  TCO2 31    CBC Recent Labs  Lab 10/07/19 0858 10/07/19 0902 10/08/19 0316  HGB 13.4 13.9 12.1*  HCT 42.5 41.0 37.3*  WBC 10.8*  --   9.2  PLT 209  --  204    COAGULATION Recent Labs  Lab 10/07/19 0858  INR 1.0    CARDIAC  No results for input(s): TROPONINI in the last 168 hours. No results for input(s): PROBNP in the last 168 hours.   CHEMISTRY Recent Labs  Lab 10/07/19 0858 10/07/19 0858 10/07/19 0902 10/08/19 0316  NA 137  --  134* 139  K 4.6   < > 4.4 4.4  CL 92*  --  94* 102  CO2 27  --   --  24  GLUCOSE 163*  --  158* 161*  BUN 54*  --  49* 67*  CREATININE 7.02*  --  7.10* 8.24*  CALCIUM 9.5  --   --  8.6*  PHOS  --   --   --  8.5*   < > = values in this interval not displayed.   Estimated Creatinine Clearance: 8.4 mL/min (A) (by C-G formula based on SCr of 8.24 mg/dL (H)).   LIVER Recent Labs  Lab 10/07/19 0858 10/08/19 0316  AST 40  --   ALT 24  --   ALKPHOS 99  --   BILITOT 0.4  --   PROT 7.3  --   ALBUMIN 3.5 2.6*  INR 1.0  --      INFECTIOUS Recent Labs  Lab 10/07/19 1003 10/07/19 1249  LATICACIDVEN 1.6 1.5     ENDOCRINE CBG (last 3)  Recent Labs    10/08/19 0318 10/08/19 0747 10/08/19 1120  GLUCAP 152* 140* 174*   ATTESTATION & SIGNATURE   Johnsie Cancel, NP-C [facility] Contact / Pager information can be found on Amion  10/09/2019, 10:22 AM

## 2019-10-09 NOTE — Evaluation (Signed)
Occupational Therapy Evaluation Patient Details Name: Frank [name] MRN: [id number] DOB: 1940-09-26 Today's Date: 10/09/2019    History of Present Illness 79 yo admitted with slurred speech and facial droop with multifocal watershed infarcts. PMhx: ESRD, DM, HTN, HLD, neuropathy, PAD, depression, gout   Clinical Impression   PTA pt living alone with children near by to assist. He states that he has had falls recently. He also reports he had not been getting in the shower due to fear of falling. At time of eval, pt able to complete bed mobility at mod A level and transfer with min A- min A +2 for safety. Pt completed functional mobility with RW to bathroom for toilet transfer, requiring max A for peri care after BM. Pt is very tangential and difficult to redirect, with poor attention, problem solving, and sequencing. Recommend SNF at d/c for continued progression of BADL prior to returning home. Will continue to follow per POC listed below.    Follow Up Recommendations  SNF;Supervision/Assistance - 24 hour    Equipment Recommendations  3 in 1 bedside commode    Recommendations for Other Services       Precautions / Restrictions Precautions Precautions: Fall Restrictions Weight Bearing Restrictions: No      Mobility Bed Mobility Overal bed mobility: Needs Assistance Bed Mobility: Supine to Sit     Supine to sit: HOB elevated;Mod assist     General bed mobility comments: mod assist with cues to sequence, increased time to rise and pivot to edge. HOB 20 degrees. Physical assist to elevate trunk pt with difficulty sequencing and not following commands  Transfers Overall transfer level: Needs assistance   Transfers: Sit to/from Stand Sit to Stand: Min assist         General transfer comment: min assist to rise from bed and from toilet x 2    Balance Overall balance assessment: Needs assistance;History of Falls   Sitting balance-Leahy Scale: Fair Sitting balance -  Comments: pt able to sit  without physical assist Postural control: Posterior lean Standing balance support: Bilateral upper extremity supported Standing balance-Leahy Scale: Poor Standing balance comment: bil UE support on RW                           ADL either performed or assessed with clinical judgement   ADL Overall ADL's : Needs assistance/impaired Eating/Feeding: NPO   Grooming: Minimal assistance;Standing;Cueing for safety;Cueing for sequencing   Upper Body Bathing: Minimal assistance;Sitting   Lower Body Bathing: Sit to/from stand;Sitting/lateral leans;Maximal assistance   Upper Body Dressing : Minimal assistance;Sitting   Lower Body Dressing: Maximal assistance;Sit to/from stand;Sitting/lateral leans Lower Body Dressing Details (indicate cue type and reason): to don socks Toilet Transfer: Minimal assistance;+2 for physical assistance;+2 for safety/equipment;RW;Grab bars   Toileting- Clothing Manipulation and Hygiene: Sit to/from stand;Total assistance Toileting - Clothing Manipulation Details (indicate cue type and reason): to complete peri hygiene after BM Tub/ Shower Transfer: Minimal assistance;+2 for physical assistance;Ambulation;Shower seat;Rolling walker   Functional mobility during ADLs: Minimal assistance;+2 for physical assistance;+2 for safety/equipment;Rolling walker;Cueing for sequencing;Cueing for safety General ADL Comments: pt limited by cognitive deficits, decreased activity tolerance and ability to complete BADL     Vision Patient Visual Report: No change from baseline       Perception     Praxis      Pertinent Vitals/Pain Pain Assessment: 0-10 Pain Score: 5  Pain Location: left heel bone spur Pain Descriptors / Indicators: Aching;Tender Pain  Intervention(s): Limited activity within patient's tolerance;Monitored during session;Repositioned     Hand Dominance     Extremity/Trunk Assessment Upper Extremity Assessment Upper  Extremity Assessment: Generalized weakness   Lower Extremity Assessment Lower Extremity Assessment: Defer to PT evaluation       Communication Communication Communication: Other (comment)(stutter, states this is baseline)   Cognition Arousal/Alertness: Awake/alert Behavior During Therapy: WFL for tasks assessed/performed Overall Cognitive Status: Impaired/Different from baseline Area of Impairment: Memory;Following commands;Safety/judgement;Attention                   Current Attention Level: Focused Memory: Decreased short-term memory Following Commands: Follows one step commands consistently Safety/Judgement: Decreased awareness of safety     General Comments: pt able to orient self, but presenting with poor attention and very tangential. He is difficult to redirect, needing multiple firm cues to complete tasks. Can be inappropriate with women   General Comments       Exercises     Shoulder Instructions      Home Living Family/patient expects to be discharged to:: Private residence Living Arrangements: Alone Available Help at Discharge: Family;Available PRN/intermittently Type of Home: Mobile home Home Access: Stairs to enter Entrance Stairs-Number of Steps: 2 Entrance Stairs-Rails: Right;Left Home Layout: One level     Bathroom Shower/Tub: Teacher, early years/pre: Standard     Home Equipment: Cane - single point   Additional Comments: Multiple, supportive children nearby; one is Therapist, sports at [facility]      Prior Functioning/Environment Level of Independence: Independent with assistive device(s)        Comments: uses SPC at baseline        OT Problem List: Decreased strength;Decreased knowledge of use of DME or AE;Decreased knowledge of precautions;Decreased activity tolerance;Decreased cognition;Impaired balance (sitting and/or standing);Decreased safety awareness      OT Treatment/Interventions: Self-care/ADL training;Therapeutic  exercise;Patient/family education;Neuromuscular education;Balance training;Energy conservation;Therapeutic activities;DME and/or AE instruction;Cognitive remediation/compensation    OT Goals(Current goals can be found in the care plan section) Acute Rehab OT Goals Patient Stated Goal: return home OT Goal Formulation: With patient Time For Goal Achievement: 10/23/19 Potential to Achieve Goals: Good  OT Frequency: Min 2X/week   Barriers to D/C:            Co-evaluation PT/OT/SLP Co-Evaluation/Treatment: Yes Reason for Co-Treatment: Complexity of the patient's impairments (multi-system involvement);For patient/therapist safety PT goals addressed during session: Mobility/safety with mobility;Proper use of DME OT goals addressed during session: ADL's and self-care      AM-PAC OT "6 Clicks" Daily Activity     Outcome Measure Help from another person eating meals?: A Little Help from another person taking care of personal grooming?: A Little Help from another person toileting, which includes using toliet, bedpan, or urinal?: A Little Help from another person bathing (including washing, rinsing, drying)?: A Lot Help from another person to put on and taking off regular upper body clothing?: A Little Help from another person to put on and taking off regular lower body clothing?: A Lot 6 Click Score: 16   End of Session Equipment Utilized During Treatment: Gait belt;Rolling walker Nurse Communication: Mobility status  Activity Tolerance: Patient tolerated treatment well Patient left: in chair;with call bell/phone within reach;with chair alarm set  OT Visit Diagnosis: Unsteadiness on feet (R26.81);Other abnormalities of gait and mobility (R26.89);Muscle weakness (generalized) (M62.81);Other symptoms and signs involving cognitive function                Time: 0921-1000 OT Time Calculation (min): 39 min  Charges:  OT General Charges $OT Visit: 1 Visit OT Evaluation $OT Eval Moderate  Complexity: 1 Mod  Zenovia Jarred, MSOT, OTR/L [facility] Office Number: [phone]  Zenovia Jarred 10/09/2019, 1:52 PM

## 2019-10-09 NOTE — Evaluation (Signed)
Physical Therapy Evaluation Patient Details Name: Frank [name] MRN: [id number] DOB: 05-14-1941 Today's Date: 10/09/2019   History of Present Illness  79 yo admitted with slurred speech and facial droop with multifocal watershed infarcts. PMhx: ESRD, DM, HTN, HLD, neuropathy, PAD, depression, gout  Clinical Impression  Pt very talkative and tangential throughout session. Cotx with OT to maximize function and safety. Pt with generalized weakness, kyphotic posture, decreased strength, safety, balance and function who will benefit from acute therapy to maximize mobility, safety and function. Pt with decreased awareness of deficits and limited verbal filter particularly regarding thoughts on females. Pt does not have 24hr assist and is not safe to return home alone at this time. Will continue to follow.   BP 116/61 with initial transition to EOB, rise to 138/84 with standing 130/68 in chair end of session    Follow Up Recommendations SNF;Supervision for mobility/OOB    Equipment Recommendations  Rolling walker with 5" wheels;3in1 (PT)    Recommendations for Other Services       Precautions / Restrictions Precautions Precautions: Fall Restrictions Weight Bearing Restrictions: No      Mobility  Bed Mobility Overal bed mobility: Needs Assistance Bed Mobility: Supine to Sit     Supine to sit: HOB elevated;Mod assist     General bed mobility comments: mod assist with cues to sequence, increased time to rise and pivot to edge. HOB 20 degrees. Physical assist to elevate trunk pt with difficulty sequencing and not following commands  Transfers Overall transfer level: Needs assistance   Transfers: Sit to/from Stand Sit to Stand: Min assist         General transfer comment: min assist to rise from bed and from toilet x 2  Ambulation/Gait Ambulation/Gait assistance: Min assist;+2 safety/equipment Gait Distance (Feet): 20 Feet Assistive device: Rolling walker (2 wheeled) Gait  Pattern/deviations: Step-through pattern;Decreased stride length;Trunk flexed;Narrow base of support;Shuffle   Gait velocity interpretation: <1.8 ft/sec, indicate of risk for recurrent falls General Gait Details: pt with narrow BOS, shuffling gait and cues for posture, position in RW and safety +2 for lines  Stairs            Wheelchair Mobility    Modified Rankin (Stroke Patients Only) Modified Rankin (Stroke Patients Only) Pre-Morbid Rankin Score: No symptoms Modified Rankin: Moderately severe disability     Balance Overall balance assessment: Needs assistance;History of Falls   Sitting balance-Leahy Scale: Fair Sitting balance - Comments: pt able to sit  without physical assist Postural control: Posterior lean Standing balance support: Bilateral upper extremity supported Standing balance-Leahy Scale: Poor Standing balance comment: bil UE support on RW                             Pertinent Vitals/Pain Pain Assessment: 0-10 Pain Score: 5  Pain Location: left heel bone spur Pain Descriptors / Indicators: Aching;Tender Pain Intervention(s): Limited activity within patient's tolerance;Monitored during session;Repositioned    Home Living Family/patient expects to be discharged to:: Private residence Living Arrangements: Alone Available Help at Discharge: Family;Available PRN/intermittently Type of Home: Mobile home Home Access: Stairs to enter Entrance Stairs-Rails: Psychiatric nurse of Steps: 2 Home Layout: One level Home Equipment: Cane - single point Additional Comments: Multiple, supportive children nearby; one is RN at [employer]    Prior Function Level of Independence: Independent with assistive device(s)         Comments: uses SPC at baseline     Hand Dominance  Extremity/Trunk Assessment   Upper Extremity Assessment Upper Extremity Assessment: Defer to OT evaluation    Lower Extremity Assessment Lower Extremity  Assessment: Generalized weakness    Cervical / Trunk Assessment Cervical / Trunk Assessment: Kyphotic  Communication   Communication: Other (comment)(stutter, states this is baseline)  Cognition Arousal/Alertness: Awake/alert Behavior During Therapy: WFL for tasks assessed/performed Overall Cognitive Status: Impaired/Different from baseline Area of Impairment: Memory;Following commands;Safety/judgement                     Memory: Decreased short-term memory Following Commands: Follows one step commands consistently Safety/Judgement: Decreased awareness of safety            General Comments      Exercises     Assessment/Plan    PT Assessment Patient needs continued PT services  PT Problem List Decreased strength;Decreased mobility;Decreased safety awareness;Decreased activity tolerance;Decreased balance;Decreased cognition       PT Treatment Interventions Gait training;Balance training;Functional mobility training;Stair training;Cognitive remediation;Therapeutic activities;Patient/family education;DME instruction;Therapeutic exercise    PT Goals (Current goals can be found in the Care Plan section)  Acute Rehab PT Goals Patient Stated Goal: return home and watch KEl[facility] RIPa PT Goal Formulation: With patient Time For Goal Achievement: 10/23/19 Potential to Achieve Goals: Fair    Frequency Min 3X/week   Barriers to discharge Decreased caregiver support      Co-evaluation PT/OT/SLP Co-Evaluation/Treatment: Yes Reason for Co-Treatment: Complexity of the patient's impairments (multi-system involvement);For patient/therapist safety PT goals addressed during session: Mobility/safety with mobility;Proper use of DME         AM-PAC PT "6 Clicks" Mobility  Outcome Measure Help needed turning from your back to your side while in a flat bed without using bedrails?: A Little Help needed moving from lying on your back to sitting on the side of a flat bed without  using bedrails?: A Little Help needed moving to and from a bed to a chair (including a wheelchair)?: A Little Help needed standing up from a chair using your arms (e.g., wheelchair or bedside chair)?: A Little Help needed to walk in hospital room?: A Little Help needed climbing 3-5 steps with a railing? : A Lot 6 Click Score: 17    End of Session Equipment Utilized During Treatment: Gait belt Activity Tolerance: Patient tolerated treatment well Patient left: in chair;with call bell/phone within reach;with chair alarm set Nurse Communication: Mobility status;Precautions PT Visit Diagnosis: Other abnormalities of gait and mobility (R26.89);Difficulty in walking, not elsewhere classified (R26.2)    Time: [id number] PT Time Calculation (min) (ACUTE ONLY): 36 min   Charges:   PT Evaluation $PT Eval Moderate Complexity: 1 Mod          Donovan, PT Acute Rehabilitation Services Pager: [phone] Office: [phone]   Jumana Paccione B Mande Auvil 10/09/2019, 10:05 AM

## 2019-10-09 NOTE — Progress Notes (Signed)
[facility] Progress Note   Dialysis Orders: TTS- [facility] 4hrs, BFR400, [id number], EDW 92kg,2K/2.25Ca Access:LU AVG Heparin5000 Calcitriol0.35mcg PO qHD   Assessment/Plan: 1. Lt sided weakness w/slurred speech/facial droopmultiple bilateral scattered watershed infarcts R > L - Concern for stroke, Rt ICA stenosis ECHO, lipid panel and A1C ordered. Per neuro. On a phenylephrine drip to keep BP up -ok for heparin  2. ESRD- HD TTS.Had  HD Thursday  in ICU, appreciate  IR assistance for  temp cath today and then AVG declot whenever can be attempted hopefully today Next HD Saturday - ok for heparin 3. Hypotension/volume- Blood pressure soft, on midodrine as OP. Does not appear volume overloaded. net UF 1 L Thursday with post wt 98.1 which is quite different from 1/27 weight - reassess weights Sat on HD  Neuro rec SBP 120/140  4. Anemiaof CKD- Hgb 12.1. No indication for ESA at this time.  5. Secondary Hyperparathyroidism -P elevated. Resume binders when eating - continue VDRA.  6. Nutrition- Renal diet w/fluid restrictions once advanced. NPO 7. DM- per primary 8. Hypothyroidism - on levothyroxine 9. Dysphagia - speech to assess  Myriam Jacobson, PA-C Hilda 10/09/2019,11:49 AM  LOS: 2 days   Subjective:   Remembers me from rounding at dialysis center. No dialysis problems yesterday. Waiting on declot. Objective Vitals:   10/09/19 0900 10/09/19 0930 10/09/19 0942 10/09/19 1130  BP: (!) 141/70 (!) 163/147 138/84   Pulse: 64 69    Resp: (!) 27 (!) 22    Temp:    98 F (36.7 C)  TempSrc:    Oral  SpO2: 98% 96% 96%   Weight:      Height:       Physical Exam General: NAD breathing easily on room air, talkative - some word finding issues Heart: RRR Lungs: no rales Abdomen: soft NT Extremities: no LE edema SCDs Dialysis Access:  Left AVGG clotted right IJ temp cath in place   Additional  Objective Labs: Basic Metabolic Panel: Recent Labs  Lab 10/07/19 0858 10/07/19 0902 10/08/19 0316  NA 137 134* 139  K 4.6 4.4 4.4  CL 92* 94* 102  CO2 27  --  24  GLUCOSE 163* 158* 161*  BUN 54* 49* 67*  CREATININE 7.02* 7.10* 8.24*  CALCIUM 9.5  --  8.6*  PHOS  --   --  8.5*   Liver Function Tests: Recent Labs  Lab 10/07/19 0858 10/08/19 0316  AST 40  --   ALT 24  --   ALKPHOS 99  --   BILITOT 0.4  --   PROT 7.3  --   ALBUMIN 3.5 2.6*   No results for input(s): LIPASE, AMYLASE in the last 168 hours. CBC: Recent Labs  Lab 10/07/19 0858 10/07/19 0902 10/08/19 0316  WBC 10.8*  --  9.2  NEUTROABS 8.6*  --   --   HGB 13.4 13.9 12.1*  HCT 42.5 41.0 37.3*  MCV 102.7*  --  101.6*  PLT 209  --  204   Blood Culture    Component Value Date/Time   SDES BLOOD SITE NOT SPECIFIED 10/07/2019 1103   SPECREQUEST  10/07/2019 1103    BOTTLES DRAWN AEROBIC AND ANAEROBIC Blood Culture adequate volume   CULT  10/07/2019 1103    NO GROWTH 2 DAYS Performed at [facility], [place] [address], [place], [place] [address]    REPTSTATUS PENDING 10/07/2019 1103    Cardiac Enzymes: No results for  input(s): CKTOTAL, CKMB, CKMBINDEX, TROPONINI in the last 168 hours. CBG: Recent Labs  Lab 10/07/19 2042 10/07/19 2317 10/08/19 0318 10/08/19 0747 10/08/19 1120  GLUCAP 150* 137* 152* 140* 174*   Iron Studies: No results for input(s): IRON, TIBC, TRANSFERRIN, FERRITIN in the last 72 hours. Lab Results  Component Value Date   INR 1.0 10/07/2019   INR 1.1 01/27/2019   Studies/Results: MR BRAIN WO CONTRAST  Result Date: 10/07/2019 CLINICAL DATA:  Initial evaluation for acute weakness, facial droop, slurred speech. Concern for stroke. EXAM: MRI HEAD WITHOUT CONTRAST TECHNIQUE: Multiplanar, multiecho pulse sequences of the brain and surrounding structures were obtained without intravenous contrast. COMPARISON:  Prior CTs from earlier the same day. FINDINGS: Brain: Moderately  advanced age-related cerebral atrophy. Patchy T2/FLAIR hyperintensity within the periventricular white matter most consistent with chronic small vessel ischemic disease, mild in nature. Multifocal areas of restricted diffusion seen involving the periventricular/periatrial white matter of both cerebral hemispheres, consistent with acute ischemic small vessel type infarcts. Overall, infarct burden slightly worse on the right as compared to the left. For reference purposes, largest area of ischemia measures approximately 12 mm at the posterior right frontal periventricular white matter. No associated hemorrhage or mass effect. No acute cortically based infarct. Gray-white matter differentiation otherwise maintained. No areas of remote cortical infarction. No acute intracranial hemorrhage. Single punctate chronic microhemorrhage noted at the right external capsule, likely related to small vessel disease. No other evidence for chronic intracranial hemorrhage. No mass lesion, midline shift or mass effect. No hydrocephalus. No extra-axial fluid collection. Incidental note made of a partially empty sella. Midline structures intact. Vascular: Major intracranial vascular flow voids are maintained. Skull and upper cervical spine: Craniocervical junction within normal limits. Bone marrow signal intensity normal. No scalp soft tissue abnormality. Sinuses/Orbits: Patient status post bilateral ocular lens replacement. Mild scattered mucosal thickening noted within the ethmoidal air cells. Paranasal sinuses are otherwise clear. No mastoid effusion. Inner ear structures grossly normal. Other: None. IMPRESSION: 1. 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 No associated hemorrhage or mass effect. 2. Underlying moderately advanced age-related cerebral atrophy with mild chronic small vessel ischemic disease.  Electronically Signed   By: Jeannine Boga M.D.   On: 10/07/2019 20:34   IR Fluoro Guide CV Line Right  Result Date: 10/08/2019 INDICATION: 79 year old with end-stage renal disease. Patient has a clotted left arm graft and will need dialysis prior to the declot procedure. EXAM: FLUOROSCOPIC AND ULTRASOUND GUIDED PLACEMENT OF A NON-TUNNELED DIALYSIS CATHETER Physician: Stephan Minister. Henn, MD MEDICATIONS: None ANESTHESIA/SEDATION: None FLUOROSCOPY TIME:  Fluoroscopy Time: 12 seconds, 2 mGy COMPLICATIONS: None immediate. PROCEDURE: The procedure was explained to the patient. The risks and benefits of the procedure were discussed and the patient's questions were addressed. Informed consent was obtained from the patient. The patient was placed supine on the interventional table. Ultrasound confirmed a patent right internal jugular vein. Ultrasound images were obtained for documentation. The right neck was prepped and draped in a sterile fashion. The right neck was anesthetized with 1% lidocaine. Maximal barrier sterile technique was utilized including caps, mask, sterile gowns, sterile gloves, sterile drape, hand hygiene and skin antiseptic. A small incision was made with #11 blade scalpel. A 21 gauge needle directed into the right internal jugular vein with ultrasound guidance. A micropuncture dilator set was placed. A 16 cm Mahurkar catheter was selected. The catheter was advanced over a wire and positioned at the superior  cavoatrial junction. Fluoroscopic images were obtained for documentation. Both dialysis lumens were found to aspirate and flush well. The proper amount of heparin was flushed in both lumens. The central venous lumen was flushed with normal saline. Catheter was sutured to skin. FINDINGS: Catheter tip at the superior cavoatrial junction. IMPRESSION: Successful placement of a right jugular non-tunneled dialysis catheter using ultrasound and fluoroscopic guidance. Electronically Signed   By: Markus Daft M.D.   On: 10/08/2019 17:18   IR US Guide Vasc Access Right  Result Date: 10/08/2019 INDICATION: 79 year old with end-stage renal disease. Patient has a clotted left arm graft and will need dialysis prior to the declot procedure. EXAM: FLUOROSCOPIC AND ULTRASOUND GUIDED PLACEMENT OF A NON-TUNNELED DIALYSIS CATHETER Physician: Stephan Minister. Henn, MD MEDICATIONS: None ANESTHESIA/SEDATION: None FLUOROSCOPY TIME:  Fluoroscopy Time: 12 seconds, 2 mGy COMPLICATIONS: None immediate. PROCEDURE: The procedure was explained to the patient. The risks and benefits of the procedure were discussed and the patient's questions were addressed. Informed consent was obtained from the patient. The patient was placed supine on the interventional table. Ultrasound confirmed a patent right internal jugular vein. Ultrasound images were obtained for documentation. The right neck was prepped and draped in a sterile fashion. The right neck was anesthetized with 1% lidocaine. Maximal barrier sterile technique was utilized including caps, mask, sterile gowns, sterile gloves, sterile drape, hand hygiene and skin antiseptic. A small incision was made with #11 blade scalpel. A 21 gauge needle directed into the right internal jugular vein with ultrasound guidance. A micropuncture dilator set was placed. A 16 cm Mahurkar catheter was selected. The catheter was advanced over a wire and positioned at the superior cavoatrial junction. Fluoroscopic images were obtained for documentation. Both dialysis lumens were found to aspirate and flush well. The proper amount of heparin was flushed in both lumens. The central venous lumen was flushed with normal saline. Catheter was sutured to skin. FINDINGS: Catheter tip at the superior cavoatrial junction. IMPRESSION: Successful placement of a right jugular non-tunneled dialysis catheter using ultrasound and fluoroscopic guidance. Electronically Signed   By: Markus Daft M.D.   On: 10/08/2019 17:18    ECHOCARDIOGRAM COMPLETE  Result Date: 10/07/2019   ECHOCARDIOGRAM REPORT   Patient Name:   Frank [name] Date of Exam: 10/07/2019 Medical Rec #:  [id number]       Height:       67.0 in Accession #:    [id number]      Weight:       204.4 lb Date of Birth:  Aug 01, 1941       BSA:          2.04 m Patient Age:    14 years        BP:           129/96 mmHg Patient Gender: M               HR:           77 bpm. Exam Location:  Inpatient Procedure: 2D Echo, Cardiac Doppler and Color Doppler Indications:    CVA  History:        Patient has prior history of Echocardiogram examinations, most                 recent 01/22/2019. Stroke and PAD; Risk Factors:Hypertension,                 Current Smoker and Diabetes. ESRD.  Sonographer:    Dustin Flock Referring Phys: [phone] Elisabeth Cara  SOOD IMPRESSIONS  1. Left ventricular ejection fraction, by visual estimation, is 60 to 65%. The left ventricle has normal function. There is moderately increased left ventricular hypertrophy.  2. Left ventricular diastolic parameters are indeterminate.  3. The left ventricle has no regional wall motion abnormalities.  4. Global right ventricle has mildly reduced systolic function.The right ventricular size is normal. No increase in right ventricular wall thickness.  5. Left atrial size was normal.  6. Right atrial size was normal.  7. The mitral valve is normal in structure. Trivial mitral valve regurgitation.  8. The tricuspid valve is normal in structure.  9. The tricuspid valve is normal in structure. Tricuspid valve regurgitation is trivial. 10. The aortic valve is tricuspid. Aortic valve regurgitation is not visualized. Mild to moderate aortic valve sclerosis/calcification without any evidence of aortic stenosis. 11. The pulmonic valve was grossly normal. Pulmonic valve regurgitation is not visualized. 12. Moderately elevated pulmonary artery systolic pressure. 13. No intracardiac source of embolism detected on this transthoracic study. A  transesophageal echocardiogram is recommended to exclude cardiac source of embolism if clinically indicated. 14. The atrial septum is grossly normal. FINDINGS  Left Ventricle: Left ventricular ejection fraction, by visual estimation, is 60 to 65%. The left ventricle has normal function. The left ventricle has no regional wall motion abnormalities. There is moderately increased left ventricular hypertrophy. Concentric left ventricular hypertrophy. Left ventricular diastolic parameters are indeterminate. Right Ventricle: The right ventricular size is normal. No increase in right ventricular wall thickness. Global RV systolic function is has mildly reduced systolic function. The tricuspid regurgitant velocity is 3.49 m/s, and with an assumed right atrial pressure of 8 mmHg, the estimated right ventricular systolic pressure is moderately elevated at 56.7 mmHg. Left Atrium: Left atrial size was normal in size. Right Atrium: Right atrial size was normal in size Pericardium: There is no evidence of pericardial effusion. Mitral Valve: The mitral valve is normal in structure. There is mild thickening of the mitral valve leaflet(s). There is mild calcification of the mitral valve leaflet(s). Trivial mitral valve regurgitation. Tricuspid Valve: The tricuspid valve is normal in structure. Tricuspid valve regurgitation is trivial. Aortic Valve: The aortic valve is tricuspid. . There is mild thickening and moderate calcification of the aortic valve. Aortic valve regurgitation is not visualized. Mild to moderate aortic valve sclerosis/calcification is present, without any evidence of aortic stenosis. There is mild thickening of the aortic valve. There is moderate calcification of the aortic valve. Pulmonic Valve: The pulmonic valve was grossly normal. Pulmonic valve regurgitation is not visualized. Pulmonic regurgitation is not visualized. Aorta: The aortic root and ascending aorta are structurally normal, with no evidence of  dilitation. Pulmonary Artery: The pulmonary artery is not well seen. Venous: The inferior vena cava was not well visualized. IAS/Shunts: The atrial septum is grossly normal. Additional Comments: No intracardiac source of embolism detected on this transthoracic study. A transesophageal echocardiogram is recommended to exclude cardiac source of embolism if clinically indicated.  LEFT VENTRICLE PLAX 2D LVIDd:         3.93 cm  Diastology LVIDs:         2.59 cm  LV e' lateral:   18.40 cm/s LV PW:         1.51 cm  LV E/e' lateral: 3.5 LV IVS:        1.69 cm  LV e' medial:    14.50 cm/s LVOT diam:     2.50 cm  LV E/e' medial:  4.4 LV SV:  43 ml LV SV Index:   20.12 LVOT Area:     4.91 cm  RIGHT VENTRICLE RV Basal diam:  3.00 cm RV S prime:     9.90 cm/s TAPSE (M-mode): 1.1 cm LEFT ATRIUM             Index       RIGHT ATRIUM           Index LA diam:        4.30 cm 2.11 cm/m  RA Area:     18.70 cm LA Vol (A2C):   37.0 ml 18.13 ml/m RA Volume:   46.30 ml  22.69 ml/m LA Vol (A4C):   52.0 ml 25.48 ml/m LA Biplane Vol: 44.3 ml 21.71 ml/m  AORTIC VALVE LVOT Vmax:   84.90 cm/s LVOT Vmean:  57.100 cm/s LVOT VTI:    0.158 m  AORTA Ao Root diam: 3.50 cm MITRAL VALVE                        TRICUSPID VALVE MV Area (PHT): 3.54 cm             TR Peak grad:   48.7 mmHg MV PHT:        62.06 msec           TR Vmax:        349.00 cm/s MV Decel Time: 214 msec MV E velocity: 63.70 cm/s 103 cm/s  SHUNTS MV A velocity: 90.00 cm/s 70.3 cm/s Systemic VTI:  0.16 m MV E/A ratio:  0.71       1.5       Systemic Diam: 2.50 cm  Buford Dresser MD Electronically signed by Buford Dresser MD Signature Date/Time: 10/07/2019/3:31:07 PM    Final    Medications: . sodium chloride 250 mL (10/07/19 1134)  . sodium chloride    . phenylephrine (NEO-SYNEPHRINE) Adult infusion 30 mcg/min (10/09/19 1000)   . aspirin EC  325 mg Oral Daily  . atorvastatin  20 mg Oral q1800  . Chlorhexidine Gluconate Cloth  6 each Topical Daily  .  Chlorhexidine Gluconate Cloth  6 each Topical Q0600  . clopidogrel  75 mg Oral Daily  . [START ON 10/10/2019] heparin  5,000 Units Subcutaneous Q8H  . levothyroxine  175 mcg Oral QAC breakfast  . midodrine  10 mg Oral TID WC  . sodium chloride flush  10-40 mL Intracatheter Q12H

## 2019-10-09 NOTE — Progress Notes (Signed)
STROKE TEAM PROGRESS NOTE   INTERVAL HISTORY Pt sitting in chair, awake alert orientated and interactive. Had HD yesterday in ICU. Plan to declot AVF today at left arm. If stable, plan for right ICA stenting early next week. BP improved and now still on low dose of neo. Continue midodrine.    Vitals:   10/09/19 1130 10/09/19 1200 10/09/19 1230 10/09/19 1245  BP: (!) 166/73 (!) 154/66 (!) 157/67 (!) 145/63  Pulse: (!) 59 64 (!) 59 (!) 53  Resp: 19 (!) 26 (!) 26 (!) 27  Temp: 98 F (36.7 C)     TempSrc: Oral     SpO2: 99% 98% 97% 98%  Weight:      Height:        CBC:  Recent Labs  Lab 10/07/19 0858 10/07/19 0902 10/08/19 0316 10/09/19 1053  WBC 10.8*   < > 9.2 10.7*  NEUTROABS 8.6*  --   --   --   HGB 13.4   < > 12.1* 12.0*  HCT 42.5   < > 37.3* 37.9*  MCV 102.7*   < > 101.6* 101.9*  PLT 209   < > 204 175   < > = values in this interval not displayed.    Basic Metabolic Panel:  Recent Labs  Lab 10/08/19 0316 10/09/19 1053  NA 139 140  K 4.4 4.1  CL 102 99  CO2 24 26  GLUCOSE 161* 98  BUN 67* 37*  CREATININE 8.24* 5.73*  CALCIUM 8.6* 9.0  PHOS 8.5*  --    Lipid Panel:     Component Value Date/Time   CHOL 92 10/08/2019 0316   CHOL 149 05/11/2019 1037   TRIG 117 10/08/2019 0316   HDL 34 (L) 10/08/2019 0316   HDL 62 05/11/2019 1037   CHOLHDL 2.7 10/08/2019 0316   VLDL 23 10/08/2019 0316   LDLCALC 35 10/08/2019 0316   LDLCALC 72 05/11/2019 1037   HgbA1c:  Lab Results  Component Value Date   HGBA1C 6.7 (H) 10/08/2019   Urine Drug Screen:     Component Value Date/Time   LABOPIA NONE DETECTED 10/07/2019 1025   COCAINSCRNUR NONE DETECTED 10/07/2019 1025   LABBENZ NONE DETECTED 10/07/2019 1025   AMPHETMU NONE DETECTED 10/07/2019 1025   THCU NONE DETECTED 10/07/2019 1025   LABBARB NONE DETECTED 10/07/2019 1025    Alcohol Level     Component Value Date/Time   ETH <10 10/07/2019 0858    IMAGING past 48 hours MR BRAIN WO CONTRAST  Result Date:  10/07/2019 CLINICAL DATA:  Initial evaluation for acute weakness, facial droop, slurred speech. Concern for stroke. EXAM: MRI HEAD WITHOUT CONTRAST TECHNIQUE: Multiplanar, multiecho pulse sequences of the brain and surrounding structures were obtained without intravenous contrast. COMPARISON:  Prior CTs from earlier the same day. FINDINGS: Brain: Moderately advanced age-related cerebral atrophy. Patchy T2/FLAIR hyperintensity within the periventricular white matter most consistent with chronic small vessel ischemic disease, mild in nature. Multifocal areas of restricted diffusion seen involving the periventricular/periatrial white matter of both cerebral hemispheres, consistent with acute ischemic small vessel type infarcts. Overall, infarct burden slightly worse on the right as compared to the left. For reference purposes, largest area of ischemia measures approximately 12 mm at the posterior right frontal periventricular white matter. No associated hemorrhage or mass effect. No acute cortically based infarct. Gray-white matter differentiation otherwise maintained. No areas of remote cortical infarction. No acute intracranial hemorrhage. Single punctate chronic microhemorrhage noted at the right external capsule, likely related to  small vessel disease. No other evidence for chronic intracranial hemorrhage. No mass lesion, midline shift or mass effect. No hydrocephalus. No extra-axial fluid collection. Incidental note made of a partially empty sella. Midline structures intact. Vascular: Major intracranial vascular flow voids are maintained. Skull and upper cervical spine: Craniocervical junction within normal limits. Bone marrow signal intensity normal. No scalp soft tissue abnormality. Sinuses/Orbits: Patient status post bilateral ocular lens replacement. Mild scattered mucosal thickening noted within the ethmoidal air cells. Paranasal sinuses are otherwise clear. No mastoid effusion. Inner ear structures grossly  normal. Other: None. IMPRESSION: 1. 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 No associated hemorrhage or mass effect. 2. Underlying moderately advanced age-related cerebral atrophy with mild chronic small vessel ischemic disease. Electronically Signed   By: Jeannine Boga M.D.   On: 10/07/2019 20:34   IR Fluoro Guide CV Line Right  Result Date: 10/08/2019 INDICATION: 79 year old with end-stage renal disease. Patient has a clotted left arm graft and will need dialysis prior to the declot procedure. EXAM: FLUOROSCOPIC AND ULTRASOUND GUIDED PLACEMENT OF A NON-TUNNELED DIALYSIS CATHETER Physician: Stephan Minister. Henn, MD MEDICATIONS: None ANESTHESIA/SEDATION: None FLUOROSCOPY TIME:  Fluoroscopy Time: 12 seconds, 2 mGy COMPLICATIONS: None immediate. PROCEDURE: The procedure was explained to the patient. The risks and benefits of the procedure were discussed and the patient's questions were addressed. Informed consent was obtained from the patient. The patient was placed supine on the interventional table. Ultrasound confirmed a patent right internal jugular vein. Ultrasound images were obtained for documentation. The right neck was prepped and draped in a sterile fashion. The right neck was anesthetized with 1% lidocaine. Maximal barrier sterile technique was utilized including caps, mask, sterile gowns, sterile gloves, sterile drape, hand hygiene and skin antiseptic. A small incision was made with #11 blade scalpel. A 21 gauge needle directed into the right internal jugular vein with ultrasound guidance. A micropuncture dilator set was placed. A 16 cm Mahurkar catheter was selected. The catheter was advanced over a wire and positioned at the superior cavoatrial junction. Fluoroscopic images were obtained for documentation. Both dialysis lumens were found to aspirate and flush well. The proper amount  of heparin was flushed in both lumens. The central venous lumen was flushed with normal saline. Catheter was sutured to skin. FINDINGS: Catheter tip at the superior cavoatrial junction. IMPRESSION: Successful placement of a right jugular non-tunneled dialysis catheter using ultrasound and fluoroscopic guidance. Electronically Signed   By: Markus Daft M.D.   On: 10/08/2019 17:18   IR US Guide Vasc Access Right  Result Date: 10/08/2019 INDICATION: 79 year old with end-stage renal disease. Patient has a clotted left arm graft and will need dialysis prior to the declot procedure. EXAM: FLUOROSCOPIC AND ULTRASOUND GUIDED PLACEMENT OF A NON-TUNNELED DIALYSIS CATHETER Physician: Stephan Minister. Henn, MD MEDICATIONS: None ANESTHESIA/SEDATION: None FLUOROSCOPY TIME:  Fluoroscopy Time: 12 seconds, 2 mGy COMPLICATIONS: None immediate. PROCEDURE: The procedure was explained to the patient. The risks and benefits of the procedure were discussed and the patient's questions were addressed. Informed consent was obtained from the patient. The patient was placed supine on the interventional table. Ultrasound confirmed a patent right internal jugular vein. Ultrasound images were obtained for documentation. The right neck was prepped and draped in a sterile fashion. The right neck was anesthetized with 1% lidocaine. Maximal barrier sterile technique was utilized including caps, mask, sterile gowns, sterile gloves, sterile drape, hand hygiene and skin antiseptic. A small incision was  made with #11 blade scalpel. A 21 gauge needle directed into the right internal jugular vein with ultrasound guidance. A micropuncture dilator set was placed. A 16 cm Mahurkar catheter was selected. The catheter was advanced over a wire and positioned at the superior cavoatrial junction. Fluoroscopic images were obtained for documentation. Both dialysis lumens were found to aspirate and flush well. The proper amount of heparin was flushed in both lumens. The  central venous lumen was flushed with normal saline. Catheter was sutured to skin. FINDINGS: Catheter tip at the superior cavoatrial junction. IMPRESSION: Successful placement of a right jugular non-tunneled dialysis catheter using ultrasound and fluoroscopic guidance. Electronically Signed   By: Markus Daft M.D.   On: 10/08/2019 17:18   ECHOCARDIOGRAM COMPLETE  Result Date: 10/07/2019   ECHOCARDIOGRAM REPORT   Patient Name:   Frank [name] Date of Exam: 10/07/2019 Medical Rec #:  [id number]       Height:       67.0 in Accession #:    [id number]      Weight:       204.4 lb Date of Birth:  Jan 30, 1941       BSA:          2.04 m Patient Age:    33 years        BP:           129/96 mmHg Patient Gender: M               HR:           77 bpm. Exam Location:  Inpatient Procedure: 2D Echo, Cardiac Doppler and Color Doppler Indications:    CVA  History:        Patient has prior history of Echocardiogram examinations, most                 recent 01/22/2019. Stroke and PAD; Risk Factors:Hypertension,                 Current Smoker and Diabetes. ESRD.  Sonographer:    Dustin Flock Referring Phys: [place]  1. Left ventricular ejection fraction, by visual estimation, is 60 to 65%. The left ventricle has normal function. There is moderately increased left ventricular hypertrophy.  2. Left ventricular diastolic parameters are indeterminate.  3. The left ventricle has no regional wall motion abnormalities.  4. Global right ventricle has mildly reduced systolic function.The right ventricular size is normal. No increase in right ventricular wall thickness.  5. Left atrial size was normal.  6. Right atrial size was normal.  7. The mitral valve is normal in structure. Trivial mitral valve regurgitation.  8. The tricuspid valve is normal in structure.  9. The tricuspid valve is normal in structure. Tricuspid valve regurgitation is trivial. 10. The aortic valve is tricuspid. Aortic valve regurgitation is not  visualized. Mild to moderate aortic valve sclerosis/calcification without any evidence of aortic stenosis. 11. The pulmonic valve was grossly normal. Pulmonic valve regurgitation is not visualized. 12. Moderately elevated pulmonary artery systolic pressure. 13. No intracardiac source of embolism detected on this transthoracic study. A transesophageal echocardiogram is recommended to exclude cardiac source of embolism if clinically indicated. 14. The atrial septum is grossly normal. FINDINGS  Left Ventricle: Left ventricular ejection fraction, by visual estimation, is 60 to 65%. The left ventricle has normal function. The left ventricle has no regional wall motion abnormalities. There is moderately increased left ventricular hypertrophy. Concentric left ventricular hypertrophy. Left ventricular diastolic parameters are  indeterminate. Right Ventricle: The right ventricular size is normal. No increase in right ventricular wall thickness. Global RV systolic function is has mildly reduced systolic function. The tricuspid regurgitant velocity is 3.49 m/s, and with an assumed right atrial pressure of 8 mmHg, the estimated right ventricular systolic pressure is moderately elevated at 56.7 mmHg. Left Atrium: Left atrial size was normal in size. Right Atrium: Right atrial size was normal in size Pericardium: There is no evidence of pericardial effusion. Mitral Valve: The mitral valve is normal in structure. There is mild thickening of the mitral valve leaflet(s). There is mild calcification of the mitral valve leaflet(s). Trivial mitral valve regurgitation. Tricuspid Valve: The tricuspid valve is normal in structure. Tricuspid valve regurgitation is trivial. Aortic Valve: The aortic valve is tricuspid. . There is mild thickening and moderate calcification of the aortic valve. Aortic valve regurgitation is not visualized. Mild to moderate aortic valve sclerosis/calcification is present, without any evidence of aortic stenosis.  There is mild thickening of the aortic valve. There is moderate calcification of the aortic valve. Pulmonic Valve: The pulmonic valve was grossly normal. Pulmonic valve regurgitation is not visualized. Pulmonic regurgitation is not visualized. Aorta: The aortic root and ascending aorta are structurally normal, with no evidence of dilitation. Pulmonary Artery: The pulmonary artery is not well seen. Venous: The inferior vena cava was not well visualized. IAS/Shunts: The atrial septum is grossly normal. Additional Comments: No intracardiac source of embolism detected on this transthoracic study. A transesophageal echocardiogram is recommended to exclude cardiac source of embolism if clinically indicated.  LEFT VENTRICLE PLAX 2D LVIDd:         3.93 cm  Diastology LVIDs:         2.59 cm  LV e' lateral:   18.40 cm/s LV PW:         1.51 cm  LV E/e' lateral: 3.5 LV IVS:        1.69 cm  LV e' medial:    14.50 cm/s LVOT diam:     2.50 cm  LV E/e' medial:  4.4 LV SV:         43 ml LV SV Index:   20.12 LVOT Area:     4.91 cm  RIGHT VENTRICLE RV Basal diam:  3.00 cm RV S prime:     9.90 cm/s TAPSE (M-mode): 1.1 cm LEFT ATRIUM             Index       RIGHT ATRIUM           Index LA diam:        4.30 cm 2.11 cm/m  RA Area:     18.70 cm LA Vol (A2C):   37.0 ml 18.13 ml/m RA Volume:   46.30 ml  22.69 ml/m LA Vol (A4C):   52.0 ml 25.48 ml/m LA Biplane Vol: 44.3 ml 21.71 ml/m  AORTIC VALVE LVOT Vmax:   84.90 cm/s LVOT Vmean:  57.100 cm/s LVOT VTI:    0.158 m  AORTA Ao Root diam: 3.50 cm MITRAL VALVE                        TRICUSPID VALVE MV Area (PHT): 3.54 cm             TR Peak grad:   48.7 mmHg MV PHT:        62.06 msec           TR Vmax:  349.00 cm/s MV Decel Time: 214 msec MV E velocity: 63.70 cm/s 103 cm/s  SHUNTS MV A velocity: 90.00 cm/s 70.3 cm/s Systemic VTI:  0.16 m MV E/A ratio:  0.71       1.5       Systemic Diam: 2.50 cm  Buford Dresser MD Electronically signed by Buford Dresser MD Signature  Date/Time: 10/07/2019/3:31:07 PM    Final     PHYSICAL EXAM  Temp:  [97.6 F (36.4 C)-98.3 F (36.8 C)] 98 F (36.7 C) (01/29 1130) Pulse Rate:  [53-85] 53 (01/29 1245) Resp:  [13-31] 27 (01/29 1245) BP: (82-166)/(42-147) 145/63 (01/29 1245) SpO2:  [87 %-100 %] 98 % (01/29 1245) Weight:  [98.1 kg] 98.1 kg (01/28 2124)  General - Well nourished, well developed, in no apparent distress.  Ophthalmologic - fundi not visualized due to noncooperation.  Cardiovascular - Regular rhythm and rate.  Mental Status -  Level of arousal and orientation to time, place, and person were intact. Language including expression, naming, repetition, comprehension was assessed and found intact. Fund of Knowledge was assessed and was intact.  Cranial Nerves II - XII - II - Visual field intact OU. III, IV, VI - Extraocular movements intact. V - Facial sensation intact bilaterally. VII - Facial movement intact bilaterally. VIII - Hearing & vestibular intact bilaterally. X - Palate elevates symmetrically. XI - Chin turning & shoulder shrug intact bilaterally. XII - Tongue protrusion intact.  Motor Strength - The patient's strength was symmetrical BUEs 4/5, RLE proximal 4/5, LLE proximal 3-/5, distally 4/4 bilaterally. Pronator drift was absent.  Bulk was normal and fasciculations were absent.   Motor Tone - Muscle tone was assessed at the neck and appendages and was normal.  Reflexes - The patient's reflexes were symmetrical in all extremities and he had no pathological reflexes.  Sensory - Light touch, temperature/pinprick were assessed and were symmetrical.    Coordination - The patient had normal movements in the hands with no ataxia or dysmetria.  Tremor was absent.  Gait and Station - deferred.   ASSESSMENT/PLAN Frank [name] is a 79 y.o. male with history of ESRD on HD, DB, DB nephropathy, HTN, HLD, hypothyroidism, PN, PVD presenting with progressive weakness, L facial droop,  dysarthria and possible L>R LE weakness. Found to have R ICA 75% stenosis.  Stroke: Multiple B scattered WM infarcts, R>L, watershed territories, secondary to hypoperfusion in the setting of chronic hypotension and right ICA high grade stenosis  Code Stroke CT head No acute abnormality. Atrophy.   CTA head & neck no LVO. R ICA 75% stenosis. Minimal L ICA stenosis   CT perfusion no core infarct   MRI  Multifocal B small vessel infarcts. Small vessel disease. Atrophy.   2D Echo EF 60-65%. No source of embolus   LDL 35  HgbA1c 6.7  Heparin subq for DVT prophylaxis  aspirin 81 mg daily prior to admission, now on aspirin 325 mg daily and clopidogrel 75 mg daily.   Therapy recommendations:  SNF   Disposition:  pending   R Carotid stenosis   CTA neck R ICA 75% stenosis. Minimal L ICA stenosis   At least partially responsible for current watershed infarcts  Discussed with Dr. Estanislado Pandy  Tentatively scheduled for right ICA stenting 2/3  P2Y12 = 272   Chronic Hypotension, improving . 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 Likely the cause of current bilateral watershed territory infarcts . On low dose neo drip, taper off if able . continue midodrine  10 mg 3 times daily . SBP goal 120 - 140 . Long-term BP goal normotensive . Avoid low BP  Hyperlipidemia  Home meds:  lipitor 20, resumed in hospital  LDL 35, goal < 70  Will not treat with intensive statin given low LDL on lipitor 20  Continue statin at discharge  Diabetes type II Controlled Diabetic Neuropathy  HgbA1c 6.7, goal < 7.0  SSI  CBG monitoring  PCP follow up closely   ESRD on hemodialysis  TTS schedule, had yesterday  Renal on board  Placed temporary catheter for HD  Declot left AV fistula today.  Other Stroke Risk Factors  Advanced age  Former Cigarette smoker, quit 27 yrs ago  Obesity, Body mass index is 33.87 kg/m., recommend weight loss, diet and exercise as appropriate    PVD  Other Active Problems  Anemia of chronic kidney dz  Secondary hyperparathryoidism   Hypothyroidism  Hx gout  Hospital day # 2  This patient is critically ill due to stroke, right ICA stenosis, hypotension, ESRD on dialysis and at significant risk of neurological worsening, death form recurrent stroke, hemorrhagic conversion, seizure, shock, fluid overload. This patient's care requires constant monitoring of vital signs, hemodynamics, respiratory and cardiac monitoring, review of multiple databases, neurological assessment, discussion with family, other specialists and medical decision making of high complexity. I spent 30 minutes of neurocritical care time in the care of this patient.  I again discussed with Dr. Estanislado Pandy.  Rosalin Hawking, MD PhD Stroke Neurology 10/09/2019 1:20 PM   To contact Stroke Continuity provider, please refer to [email or web address] After hours, contact General Neurology

## 2019-10-10 DIAGNOSIS — I6521 Occlusion and stenosis of right carotid artery: Secondary | ICD-10-CM

## 2019-10-10 LAB — CBC WITH DIFFERENTIAL/PLATELET
Abs Immature Granulocytes: 0.05 10*3/uL (ref 0.00–0.07)
Basophils Absolute: 0 10*3/uL (ref 0.0–0.1)
Basophils Relative: 0 %
Eosinophils Absolute: 0.2 10*3/uL (ref 0.0–0.5)
Eosinophils Relative: 2 %
HCT: 37.3 % — ABNORMAL LOW (ref 39.0–52.0)
Hemoglobin: 12.7 g/dL — ABNORMAL LOW (ref 13.0–17.0)
Immature Granulocytes: 0 %
Lymphocytes Relative: 9 %
Lymphs Abs: 1.1 10*3/uL (ref 0.7–4.0)
MCH: 33.4 pg (ref 26.0–34.0)
MCHC: 34 g/dL (ref 30.0–36.0)
MCV: 98.2 fL (ref 80.0–100.0)
Monocytes Absolute: 0.9 10*3/uL (ref 0.1–1.0)
Monocytes Relative: 8 %
Neutro Abs: 9.6 10*3/uL — ABNORMAL HIGH (ref 1.7–7.7)
Neutrophils Relative %: 81 %
Platelets: 159 10*3/uL (ref 150–400)
RBC: 3.8 MIL/uL — ABNORMAL LOW (ref 4.22–5.81)
RDW: 13.1 % (ref 11.5–15.5)
WBC: 11.9 10*3/uL — ABNORMAL HIGH (ref 4.0–10.5)
nRBC: 0 % (ref 0.0–0.2)

## 2019-10-10 LAB — RENAL FUNCTION PANEL
Albumin: 2.7 g/dL — ABNORMAL LOW (ref 3.5–5.0)
Anion gap: 14 (ref 5–15)
BUN: 22 mg/dL (ref 8–23)
CO2: 25 mmol/L (ref 22–32)
Calcium: 8.7 mg/dL — ABNORMAL LOW (ref 8.9–10.3)
Chloride: 98 mmol/L (ref 98–111)
Creatinine, Ser: 4.05 mg/dL — ABNORMAL HIGH (ref 0.61–1.24)
GFR calc Af Amer: 15 mL/min — ABNORMAL LOW (ref >60)
GFR calc non Af Amer: 13 mL/min — ABNORMAL LOW (ref >60)
Glucose, Bld: 243 mg/dL — ABNORMAL HIGH (ref 70–99)
Phosphorus: 3.1 mg/dL (ref 2.5–4.6)
Potassium: 3.3 mmol/L — ABNORMAL LOW (ref 3.5–5.1)
Sodium: 137 mmol/L (ref 135–145)

## 2019-10-10 LAB — MAGNESIUM: Magnesium: 1.6 mg/dL — ABNORMAL LOW (ref 1.7–2.4)

## 2019-10-10 MED ORDER — HEPARIN SODIUM (PORCINE) 1000 UNIT/ML DIALYSIS
1000.0000 [IU] | INTRAMUSCULAR | Status: DC | PRN
  Filled 2019-10-10 (×4): qty 1

## 2019-10-10 MED ORDER — LIDOCAINE HCL URETHRAL/MUCOSAL 2 % EX GEL
1.0000 "application " | Freq: Once | CUTANEOUS | Status: AC
  Administered 2019-10-10: 1 via URETHRAL
  Filled 2019-10-10 (×2): qty 20

## 2019-10-10 NOTE — Progress Notes (Signed)
NAME:  Frank [name], MRN:  [id number], DOB:  1941/05/24, LOS: 3 ADMISSION DATE:  10/07/2019, CONSULTATION DATE:  10/07/19 REFERRING MD:  Dr. Rory Percy, Neuro, CHIEF COMPLAINT:  Facial droop   Brief History   79 yo male former smoker presented to ER with weakness, facial droop and slurred speech with concern for CVA.  Seen by neurology.  Outside time window for thrombolytic therapy.  Noted to have low blood pressure and started on phenylephrine.  PCCM asked to admit to ICU.  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 No associated hemorrhage or masseffect.  Micro Data:  SARS CoV2 PCR 1/27 >> negative Influenza PCR 1/27 >> A and B negative  Antimicrobials:    Interim history/subjective:  Patient reports intermittent penile pain with associated urge to void. Otherwise feeling generally better. No acute events o/n  Objective   Blood pressure 125/60, pulse 97, temperature 98.2 F (36.8 C), temperature source Oral, resp. rate (!) 27, height 5\' 7"  (1.702 m), weight 98.1 kg, SpO2 96 %.        Intake/Output Summary (Last 24 hours) at 10/10/2019 1156 Last data filed at 10/10/2019 1100 Gross per 24 hour  Intake 815.08 ml  Output --  Net 815.08 ml   Filed Weights   10/07/19 1230 10/08/19 2124  Weight: 92.7 kg 98.1 kg    Examination: General: Chronically ill appearing elderly male, in NAD HEENT: [place]/AT, MM pink/moist, PERRL,  Neuro: Alert and oriented, bilateral upper extremity  strength 5/5 very faintly weaker on left. Bilateral LE 4/5 strength  CV: s1s2 regular rate and rhythm, no murmur, rubs, or gallops,  PULM:  Clear to ascultation bilaterally, no increased work of breathing  GI: soft, bowel sounds active in all 4 quadrants, non-tender, non-distended Extremities: warm/dry, no edema  Skin: no rashes or lesions  Resolved Hospital Problem list     Assessment & Plan:   Multiple Bilateral scattered watershed infarcts R>Lsecondary to hypoperfusion in the setting of chronic hypotensino and right ICA stenosis  - found to have Rt ICA stenosis - Watershed infarcts related to carotid stenosis P: Neurology following appreciate assistance  Close monitoring of neurologic and hemodynamic status  ECHO with preserved EF, Diastolic function indeterminate  A1C 6.1 Lipid profile WNL Continue ASA and Plavix  Plan for probable cerebral arteriogram with possible R carotid artery intervention early next week  Chronic hypotension.- sbp 100 at home per patient P: -Continue home Midodrine  -Neurology recommends SBP goal 120-140; will maintain Phenylephrine drip to maintain SBP goal  -BP monitoring in ICU setting   DM nephropathy with ESRD. - IR placed R IJ non-tunned HD cath due to clotted left arm access; thrombectomy 1/29  P: Nephrology following  iHD per nephrology   DM poorly controlled DM neuropathy P: SSI  Monitor  Hold home Neurontin   Hx of hypothyroidism. -TSH WNL P: Continue home Synthroid  Hx of gout. P: Continue home Allopurinol   Dysphagia.  P: Speech to assess   Best practice:  Diet: renal diet DVT prophylaxis: SQ heparin  GI prophylaxis: not indicated Mobility: bed rest Code Status: full code Disposition: ICU Updates: patient interactive   LABS    PULMONARY Recent Labs  Lab 10/07/19 0902  TCO2 31    CBC Recent Labs  Lab 10/07/19 0858 10/07/19 0858 10/07/19 0902 10/08/19 0316 10/09/19 1053  HGB 13.4   < > 13.9 12.1*  12.0*  HCT 42.5   < > 41.0 37.3* 37.9*  WBC 10.8*  --   --  9.2 10.7*  PLT 209  --   --  204 175   < > = values in this interval not displayed.    COAGULATION Recent Labs  Lab 10/07/19 0858  INR 1.0    CARDIAC  No results for input(s): TROPONINI in the last 168 hours. No results for input(s): PROBNP in the last 168 hours.   CHEMISTRY Recent Labs  Lab 10/07/19 0858 10/07/19 0858 10/07/19 0902 10/07/19 0902 10/08/19 0316 10/09/19 1053  NA 137  --  134*  --  139 140  K 4.6   < > 4.4   < > 4.4 4.1  CL 92*  --  94*  --  102 99  CO2 27  --   --   --  24 26  GLUCOSE 163*  --  158*  --  161* 98  BUN 54*  --  49*  --  67* 37*  CREATININE 7.02*  --  7.10*  --  8.24* 5.73*  CALCIUM 9.5  --   --   --  8.6* 9.0  PHOS  --   --   --   --  8.5*  --    < > = values in this interval not displayed.   Estimated Creatinine Clearance: 12 mL/min (A) (by C-G formula based on SCr of 5.73 mg/dL (H)).   LIVER Recent Labs  Lab 10/07/19 0858 10/08/19 0316  AST 40  --   ALT 24  --   ALKPHOS 99  --   BILITOT 0.4  --   PROT 7.3  --   ALBUMIN 3.5 2.6*  INR 1.0  --      INFECTIOUS Recent Labs  Lab 10/07/19 1003 10/07/19 1249  LATICACIDVEN 1.6 1.5     ENDOCRINE CBG (last 3)  Recent Labs    10/08/19 0318 10/08/19 0747 10/08/19 1120  GLUCAP 152* 140* 174*   I have independently seen and examined the patient, reviewed data, and developed an assessment and plan. A total of 33 minutes were spent in critical care assessment and medical decision making. This critical care time does not reflect procedure time, or teaching time or supervisory time of PA/NP/Med student/Med Resident, etc but could involve care discussion time.  Bonna Gains, MD PhD 10/10/19 11:57 AM   10/10/2019, 11:56 AM

## 2019-10-10 NOTE — Progress Notes (Signed)
STROKE TEAM PROGRESS NOTE   INTERVAL HISTORY Pt lying in bed, having hemodialysis in ICU.  Neuro stable, BP improved but still on low dose of neo. Continue midodrine.  Plan for right ICA stent next Wednesday with Dr. Estanislado Pandy  Vitals:   10/10/19 0630 10/10/19 0645 10/10/19 0700 10/10/19 0715  BP: 136/69 137/78 133/71 (!) 142/75  Pulse: 79 76 78 81  Resp: (!) 24 (!) 36 (!) 27 (!) 25  Temp:      TempSrc:      SpO2: 95% 96% 93% 94%  Weight:      Height:        CBC:  Recent Labs  Lab 10/07/19 0858 10/07/19 0902 10/08/19 0316 10/09/19 1053  WBC 10.8*   < > 9.2 10.7*  NEUTROABS 8.6*  --   --   --   HGB 13.4   < > 12.1* 12.0*  HCT 42.5   < > 37.3* 37.9*  MCV 102.7*   < > 101.6* 101.9*  PLT 209   < > 204 175   < > = values in this interval not displayed.    Basic Metabolic Panel:  Recent Labs  Lab 10/08/19 0316 10/09/19 1053  NA 139 140  K 4.4 4.1  CL 102 99  CO2 24 26  GLUCOSE 161* 98  BUN 67* 37*  CREATININE 8.24* 5.73*  CALCIUM 8.6* 9.0  PHOS 8.5*  --    Lipid Panel:     Component Value Date/Time   CHOL 92 10/08/2019 0316   CHOL 149 05/11/2019 1037   TRIG 117 10/08/2019 0316   HDL 34 (L) 10/08/2019 0316   HDL 62 05/11/2019 1037   CHOLHDL 2.7 10/08/2019 0316   VLDL 23 10/08/2019 0316   LDLCALC 35 10/08/2019 0316   LDLCALC 72 05/11/2019 1037   HgbA1c:  Lab Results  Component Value Date   HGBA1C 6.7 (H) 10/08/2019   Urine Drug Screen:     Component Value Date/Time   LABOPIA NONE DETECTED 10/07/2019 1025   COCAINSCRNUR NONE DETECTED 10/07/2019 1025   LABBENZ NONE DETECTED 10/07/2019 1025   AMPHETMU NONE DETECTED 10/07/2019 1025   THCU NONE DETECTED 10/07/2019 1025   LABBARB NONE DETECTED 10/07/2019 1025    Alcohol Level     Component Value Date/Time   ETH <10 10/07/2019 0858    IMAGING past 48 hours IR Fluoro Guide CV Line Right  Result Date: 10/08/2019 INDICATION: 79 year old with end-stage renal disease. Patient has a clotted left arm  graft and will need dialysis prior to the declot procedure. EXAM: FLUOROSCOPIC AND ULTRASOUND GUIDED PLACEMENT OF A NON-TUNNELED DIALYSIS CATHETER Physician: Stephan Minister. Henn, MD MEDICATIONS: None ANESTHESIA/SEDATION: None FLUOROSCOPY TIME:  Fluoroscopy Time: 12 seconds, 2 mGy COMPLICATIONS: None immediate. PROCEDURE: The procedure was explained to the patient. The risks and benefits of the procedure were discussed and the patient's questions were addressed. Informed consent was obtained from the patient. The patient was placed supine on the interventional table. Ultrasound confirmed a patent right internal jugular vein. Ultrasound images were obtained for documentation. The right neck was prepped and draped in a sterile fashion. The right neck was anesthetized with 1% lidocaine. Maximal barrier sterile technique was utilized including caps, mask, sterile gowns, sterile gloves, sterile drape, hand hygiene and skin antiseptic. A small incision was made with #11 blade scalpel. A 21 gauge needle directed into the right internal jugular vein with ultrasound guidance. A micropuncture dilator set was placed. A 16 cm Mahurkar catheter was selected. The catheter  was advanced over a wire and positioned at the superior cavoatrial junction. Fluoroscopic images were obtained for documentation. Both dialysis lumens were found to aspirate and flush well. The proper amount of heparin was flushed in both lumens. The central venous lumen was flushed with normal saline. Catheter was sutured to skin. FINDINGS: Catheter tip at the superior cavoatrial junction. IMPRESSION: Successful placement of a right jugular non-tunneled dialysis catheter using ultrasound and fluoroscopic guidance. Electronically Signed   By: Markus Daft M.D.   On: 10/08/2019 17:18   IR US Guide Vasc Access Left  Result Date: 10/09/2019 INDICATION: Clotted graft. History of end-stage renal disease currently receiving dialysis via left upper extremity dialysis graft  created approximately 6 months ago. Patient report, patient underwent declot procedure approximately 3 months ago. Patient underwent placement of a temporary right jugular approach dialysis catheter yesterday, 10/08/2019 for urgent dialysis and returns today for attempted image guided left upper extremity dialysis graft thrombolysis. EXAM: 1. FISTULALYSIS 2. ANGIOPLASTY OF VENOUS LIMB AND VENOUS ANASTOMOSIS 3. ULTRASOUND GUIDANCE FOR VENOUS ACCESS COMPARISON:  Image guided right non tunneled dialysis catheter placement-10/08/2019. MEDICATIONS: Heparin 4000 units IV; TPA 4 mg into graft. CONTRAST:  35 cc Omnipaque 3 ANESTHESIA/SEDATION: Moderate (conscious) sedation was employed during this procedure. A total of Versed 2 mg and Fentanyl 100 mcg was administered intravenously. Moderate Sedation Time: 40 minutes. The patient's level of consciousness and vital signs were monitored continuously by radiology nursing throughout the procedure under my direct supervision. FLUOROSCOPY TIME:  4 minutes, 48 seconds (44 mGy) COMPLICATIONS: None immediate. TECHNIQUE: Informed written consent was obtained from the patient after a discussion of the risks, benefits and alternatives to treatment. Questions regarding the procedure were encouraged and answered. A timeout was performed prior to the initiation of the procedure. On physical examination, the existing left arm dialysis graft was negative for palpable pulse or thrill. The skin overlying the graft was prepped and draped in the usual sterile fashion, and a sterile drape was applied covering the operative field. Maximum barrier sterile technique with sterile gowns and gloves were used for the procedure. Under ultrasound guidance, the dialysis graft was accessed directed towards the venous anastomosis with a micropuncture kit after the overlying soft tissues were anesthetized with 1% lidocaine. An ultrasound image was saved for documentation purposes. The micropuncture sheath  was exchange for a 7-French vascular sheath over a guidewire. Over a Benson wire, a Kumpe catheter was advanced centrally and a central venogram was performed. Pull back venogram was performed with the Kumpe catheter. Heparin was administered systemically and TPA was administered via the Kumpe catheter throughout near the entirety of the venous limb. The venous anastomosis and majority of the venous limb was angioplastied with a 6 mm x 4 cm Conquest balloon. An additional access was obtained directed towards the arterial anastomoses with a micropuncture kit after the overlying soft tissues anesthetized with 1% lidocaine. This allowed for placement of a 6-French vascular sheath. The graft was thrombectomized with several rounds of push-pull mechanical thrombectomy with an occlusion balloon. Flow was restored to the graft as evidenced by restored pulsatility of the graft and brisk blood return from the side arm of the vascular sheath. Shuntograms were performed. Small area of adherent thrombus within the venous stick zone of the upper arm dialysis graft was angioplastied at multiple stations with a 7 mm x 4 cm Conquest balloon. Completion images were obtained. At this point, the procedure was terminated. All wires, catheters and sheaths were removed from the patient.  Hemostasis was achieved at both access sites with deployment of a swizzle sutures which will be removed at the patient's next dialysis session. Dressings were placed. The patient tolerated the procedure without immediate postprocedural complication. FINDINGS: The existing left upper extremity AV graft is thrombosed. The venous anastomosis and near the entirety of the venous limb was angioplastied to 6 mm diameter. The graft was successfully thrombectomized using mechanical and pharmacologic means as above. Completion shuntogram demonstrates the venous limb and anastomosis are widely patent. The central venous system, arterial limb and arterial anastomosis  are widely patent. IMPRESSION: 1. Technically successful left AV graft thrombolysis. 2. Successful angioplasty of the venous anastomosis and majority of the venous limb to 6 mm diameter. Completion shuntogram demonstrates the venous limb and anastomosis are widely patent. 3. The arterial anastomosis and arterial limb are widely patent. 4. The central venous system is widely patent. ACCESS: This graft would be amenable to future percutaneous intervention as clinically indicated. Electronically Signed   By: Sandi Mariscal M.D.   On: 10/09/2019 17:32   IR US Guide Vasc Access Right  Result Date: 10/08/2019 INDICATION: 79 year old with end-stage renal disease. Patient has a clotted left arm graft and will need dialysis prior to the declot procedure. EXAM: FLUOROSCOPIC AND ULTRASOUND GUIDED PLACEMENT OF A NON-TUNNELED DIALYSIS CATHETER Physician: Stephan Minister. Henn, MD MEDICATIONS: None ANESTHESIA/SEDATION: None FLUOROSCOPY TIME:  Fluoroscopy Time: 12 seconds, 2 mGy COMPLICATIONS: None immediate. PROCEDURE: The procedure was explained to the patient. The risks and benefits of the procedure were discussed and the patient's questions were addressed. Informed consent was obtained from the patient. The patient was placed supine on the interventional table. Ultrasound confirmed a patent right internal jugular vein. Ultrasound images were obtained for documentation. The right neck was prepped and draped in a sterile fashion. The right neck was anesthetized with 1% lidocaine. Maximal barrier sterile technique was utilized including caps, mask, sterile gowns, sterile gloves, sterile drape, hand hygiene and skin antiseptic. A small incision was made with #11 blade scalpel. A 21 gauge needle directed into the right internal jugular vein with ultrasound guidance. A micropuncture dilator set was placed. A 16 cm Mahurkar catheter was selected. The catheter was advanced over a wire and positioned at the superior cavoatrial junction.  Fluoroscopic images were obtained for documentation. Both dialysis lumens were found to aspirate and flush well. The proper amount of heparin was flushed in both lumens. The central venous lumen was flushed with normal saline. Catheter was sutured to skin. FINDINGS: Catheter tip at the superior cavoatrial junction. IMPRESSION: Successful placement of a right jugular non-tunneled dialysis catheter using ultrasound and fluoroscopic guidance. Electronically Signed   By: Markus Daft M.D.   On: 10/08/2019 17:18   IR THROMBECTOMY AV FISTULA W/THROMBOLYSIS/PTA INC/SHUNT/IMG LEFT  Result Date: 10/09/2019 INDICATION: Clotted graft. History of end-stage renal disease currently receiving dialysis via left upper extremity dialysis graft created approximately 6 months ago. Patient report, patient underwent declot procedure approximately 3 months ago. Patient underwent placement of a temporary right jugular approach dialysis catheter yesterday, 10/08/2019 for urgent dialysis and returns today for attempted image guided left upper extremity dialysis graft thrombolysis. EXAM: 1. FISTULALYSIS 2. ANGIOPLASTY OF VENOUS LIMB AND VENOUS ANASTOMOSIS 3. ULTRASOUND GUIDANCE FOR VENOUS ACCESS COMPARISON:  Image guided right non tunneled dialysis catheter placement-10/08/2019. MEDICATIONS: Heparin 4000 units IV; TPA 4 mg into graft. CONTRAST:  35 cc Omnipaque 3 ANESTHESIA/SEDATION: Moderate (conscious) sedation was employed during this procedure. A total of Versed 2 mg and Fentanyl 100  mcg was administered intravenously. Moderate Sedation Time: 40 minutes. The patient's level of consciousness and vital signs were monitored continuously by radiology nursing throughout the procedure under my direct supervision. FLUOROSCOPY TIME:  4 minutes, 48 seconds (44 mGy) COMPLICATIONS: None immediate. TECHNIQUE: Informed written consent was obtained from the patient after a discussion of the risks, benefits and alternatives to treatment. Questions  regarding the procedure were encouraged and answered. A timeout was performed prior to the initiation of the procedure. On physical examination, the existing left arm dialysis graft was negative for palpable pulse or thrill. The skin overlying the graft was prepped and draped in the usual sterile fashion, and a sterile drape was applied covering the operative field. Maximum barrier sterile technique with sterile gowns and gloves were used for the procedure. Under ultrasound guidance, the dialysis graft was accessed directed towards the venous anastomosis with a micropuncture kit after the overlying soft tissues were anesthetized with 1% lidocaine. An ultrasound image was saved for documentation purposes. The micropuncture sheath was exchange for a 7-French vascular sheath over a guidewire. Over a Benson wire, a Kumpe catheter was advanced centrally and a central venogram was performed. Pull back venogram was performed with the Kumpe catheter. Heparin was administered systemically and TPA was administered via the Kumpe catheter throughout near the entirety of the venous limb. The venous anastomosis and majority of the venous limb was angioplastied with a 6 mm x 4 cm Conquest balloon. An additional access was obtained directed towards the arterial anastomoses with a micropuncture kit after the overlying soft tissues anesthetized with 1% lidocaine. This allowed for placement of a 6-French vascular sheath. The graft was thrombectomized with several rounds of push-pull mechanical thrombectomy with an occlusion balloon. Flow was restored to the graft as evidenced by restored pulsatility of the graft and brisk blood return from the side arm of the vascular sheath. Shuntograms were performed. Small area of adherent thrombus within the venous stick zone of the upper arm dialysis graft was angioplastied at multiple stations with a 7 mm x 4 cm Conquest balloon. Completion images were obtained. At this point, the procedure was  terminated. All wires, catheters and sheaths were removed from the patient. Hemostasis was achieved at both access sites with deployment of a swizzle sutures which will be removed at the patient's next dialysis session. Dressings were placed. The patient tolerated the procedure without immediate postprocedural complication. FINDINGS: The existing left upper extremity AV graft is thrombosed. The venous anastomosis and near the entirety of the venous limb was angioplastied to 6 mm diameter. The graft was successfully thrombectomized using mechanical and pharmacologic means as above. Completion shuntogram demonstrates the venous limb and anastomosis are widely patent. The central venous system, arterial limb and arterial anastomosis are widely patent. IMPRESSION: 1. Technically successful left AV graft thrombolysis. 2. Successful angioplasty of the venous anastomosis and majority of the venous limb to 6 mm diameter. Completion shuntogram demonstrates the venous limb and anastomosis are widely patent. 3. The arterial anastomosis and arterial limb are widely patent. 4. The central venous system is widely patent. ACCESS: This graft would be amenable to future percutaneous intervention as clinically indicated. Electronically Signed   By: Sandi Mariscal M.D.   On: 10/09/2019 17:32    PHYSICAL EXAM  Temp:  [97.6 F (36.4 C)-98.5 F (36.9 C)] 98.5 F (36.9 C) (01/30 0400) Pulse Rate:  [35-85] 81 (01/30 0715) Resp:  [17-36] 25 (01/30 0715) BP: (73-166)/(35-147) 142/75 (01/30 0715) SpO2:  [88 %-100 %] 94 % (  01/30 0715)  General - Well nourished, well developed, in no apparent distress.  Ophthalmologic - fundi not visualized due to noncooperation.  Cardiovascular - Regular rhythm and rate.  Mental Status -  Level of arousal and orientation to time, place, and person were intact. Language including expression, naming, repetition, comprehension was assessed and found intact. Fund of Knowledge was assessed and  was intact.  Cranial Nerves II - XII - II - Visual field intact OU. III, IV, VI - Extraocular movements intact. V - Facial sensation intact bilaterally. VII - Facial movement intact bilaterally. VIII - Hearing & vestibular intact bilaterally. X - Palate elevates symmetrically. XI - Chin turning & shoulder shrug intact bilaterally. XII - Tongue protrusion intact.  Motor Strength - The patient's strength was symmetrical BUEs 4+/5, RLE proximal 4/5, LLE proximal 3-/5, distally 4/4 bilaterally. Pronator drift was absent.  Bulk was normal and fasciculations were absent.   Motor Tone - Muscle tone was assessed at the neck and appendages and was normal.  Reflexes - The patient's reflexes were symmetrical in all extremities and he had no pathological reflexes.  Sensory - Light touch, temperature/pinprick were assessed and were symmetrical.    Coordination - The patient had normal movements in the hands with no ataxia or dysmetria.  Tremor was absent.  Gait and Station - deferred.   ASSESSMENT/PLAN Mr. ROHN FRITSCH is a 79 y.o. male with history of ESRD on HD, DB, DB nephropathy, HTN, HLD, hypothyroidism, PN, PVD presenting with progressive weakness, L facial droop, dysarthria and possible L>R LE weakness. Found to have R ICA 75% stenosis.  Stroke: Multiple B scattered WM infarcts, R>L, watershed territories, secondary to hypoperfusion in the setting of chronic hypotension and right ICA high grade stenosis  Code Stroke CT head No acute abnormality. Atrophy.   CTA head & neck no LVO. R ICA 75% stenosis. Minimal L ICA stenosis   CT perfusion no core infarct   MRI  Multifocal B small vessel infarcts. Small vessel disease. Atrophy.   2D Echo EF 60-65%. No source of embolus   LDL 35  HgbA1c 6.7  Heparin subq for DVT prophylaxis  aspirin 81 mg daily prior to admission, now on aspirin 325 mg daily and clopidogrel 75 mg daily.   Therapy recommendations:  SNF   Disposition:  pending    R Carotid stenosis   CTA neck R ICA 75% stenosis. Minimal L ICA stenosis   At least partially responsible for current watershed infarcts  Discussed with Dr. Estanislado Pandy  Tentatively scheduled for right ICA stenting 2/3  P2Y12 = 272   Chronic Hypotension, improving . 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 Likely the cause of current bilateral watershed territory infarcts . On low dose neo drip, taper off if able . continue midodrine 10 mg 3 times daily . SBP goal 120 - 140 . Long-term BP goal normotensive . Avoid low BP  Hyperlipidemia  Home meds:  lipitor 20, resumed in hospital  LDL 35, goal < 70  Will not treat with intensive statin given low LDL on lipitor 20  Continue statin at discharge  Diabetes type II Controlled Diabetic Neuropathy  HgbA1c 6.7, goal < 7.0  SSI  CBG monitoring  PCP follow up closely   ESRD on hemodialysis  TTS schedule, HD today  Renal on board  Placed temporary catheter for HD  Successful declot left AV fistula 10/09/2019.  Other Stroke Risk Factors  Advanced age  Former Cigarette smoker, quit 27 yrs ago  Obesity,  Body mass index is 33.87 kg/m., recommend weight loss, diet and exercise as appropriate   PVD  Other Active Problems  Anemia of chronic kidney dz  Secondary hyperparathryoidism   Hypothyroidism  Hx gout  Mild Leukocytosis - 10.7->11.9 (afebrile)  Hypokalemia, K3.3   Hospital day # 3  Neurology will follow peripherally. Please call with questions. Pt will follow up with stroke clinic NP at [facility] in about 4 weeks. Thanks for the consult.   Rosalin Hawking, MD PhD Stroke Neurology 10/10/2019 2:56 PM      To contact Stroke Continuity provider, please refer to [email or web address] After hours, contact General Neurology

## 2019-10-10 NOTE — Progress Notes (Signed)
[facility] Progress Note   Dialysis Orders: TTS- [facility] 4hrs, BFR400, [id number], EDW 92kg,2K/2.25Ca Access:LU AVG Heparin5000 Calcitriol0.72mg PO qHD   Assessment/Plan: 1. Lt sided weakness w/slurred speech/facial droopmultiple bilateral scattered watershed infarcts R > L - Concern for stroke, Rt ICA stenosis ECHO, lipid panel and A1C ordered. Per neuro. On a phenylephrine drip to keep BP up -ok for heparin  2. ESRD- HD TTS.Had  HD Thursday  in ICU, appreciate  IR assistance for  temp cath 1/28 and then AVG declot 1/29. Ok to use AVG today. Next HD today - ok for heparin 3. Hypotension/volume- Blood pressure soft, on midodrine as OP. Does not appear volume overloaded. Doubt last wt, will reorder. Neuro rec SBP 120-140  4. Anemiaof CKD- Hgb 12.1. No indication for ESA at this time.  5. Secondary Hyperparathyroidism -P elevated. Resume binders when eating - continue VDRA.  6. Nutrition- Renal diet w/fluid restrictions once advanced. NPO 7. DM- per primary 8. Hypothyroidism - on levothyroxine 9. Dysphagia - speech to assess  RKelly Splinter MD 10/10/2019, 3:46 PM      Subjective:   Had declot yest of AVG. Penile pain today  Objective Vitals:   10/10/19 1000 10/10/19 1100 10/10/19 1200 10/10/19 1300  BP: 132/62 125/60 111/60 130/60  Pulse: 96 97 (!) 101 (!) 114  Resp: (!) 32 (!) 27 19 (!) 23  Temp:   97.9 F (36.6 C)   TempSrc:   Oral   SpO2: 96% 96% 96% 97%  Weight:      Height:       Physical Exam General: NAD breathing easily on room air Heart: RRR Lungs: no rales Abdomen: soft NT Extremities: no LE edema SCDs Dialysis Access:  Left AVGG +bruit, R temp cath in neck   Additional Objective Labs: Basic Metabolic Panel: Recent Labs  Lab 10/08/19 0316 10/09/19 1053 10/10/19 1244  NA 139 140 137  K 4.4 4.1 3.3*  CL 102 99 98  CO2 _0 GLUCOSE 161* 98 243*  BUN 67* 37* 22  CREATININE 8.24* 5.73* 4.05*   CALCIUM 8.6* 9.0 8.7*  PHOS 8.5*  --  3.1   Liver Function Tests: Recent Labs  Lab 10/07/19 0858 10/08/19 0316 10/10/19 1244  AST 40  --   --   ALT 24  --   --   ALKPHOS 99  --   --   BILITOT 0.4  --   --   PROT 7.3  --   --   ALBUMIN 3.5 2.6* 2.7*   No results for input(s): LIPASE, AMYLASE in the last 168 hours. CBC: Recent Labs  Lab 10/07/19 0858 10/07/19 0902 10/08/19 0316 10/09/19 1053 10/10/19 1244  WBC 10.8*   < > 9.2 10.7* 11.9*  NEUTROABS 8.6*  --   --   --  9.6*  HGB 13.4   < > 12.1* 12.0* 12.7*  HCT 42.5   < > 37.3* 37.9* 37.3*  MCV 102.7*  --  101.6* 101.9* 98.2  PLT 209   < > 204 175 159   < > = values in this interval not displayed.   Blood Culture    Component Value Date/Time   SDES BLOOD SITE NOT SPECIFIED 10/07/2019 1103   SPECREQUEST  10/07/2019 1103    BOTTLES DRAWN AEROBIC AND ANAEROBIC Blood Culture adequate volume   CULT  10/07/2019 1103    NO GROWTH 3 DAYS Performed at [facility] [id number], [place] [address]  REPTSTATUS PENDING 10/07/2019 1103    Cardiac Enzymes: No results for input(s): CKTOTAL, CKMB, CKMBINDEX, TROPONINI in the last 168 hours. CBG: Recent Labs  Lab 10/07/19 2042 10/07/19 2317 10/08/19 0318 10/08/19 0747 10/08/19 1120  GLUCAP 150* 137* 152* 140* 174*   Iron Studies: No results for input(s): IRON, TIBC, TRANSFERRIN, FERRITIN in the last 72 hours. Lab Results  Component Value Date   INR 1.0 10/07/2019   INR 1.1 01/27/2019   Studies/Results: IR Fluoro Guide CV Line Right  Result Date: 10/08/2019 INDICATION: 79 year old with end-stage renal disease. Patient has a clotted left arm graft and will need dialysis prior to the declot procedure. EXAM: FLUOROSCOPIC AND ULTRASOUND GUIDED PLACEMENT OF A NON-TUNNELED DIALYSIS CATHETER Physician: Stephan Minister. Henn, MD MEDICATIONS: None ANESTHESIA/SEDATION: None FLUOROSCOPY TIME:  Fluoroscopy Time: 12 seconds, 2 mGy COMPLICATIONS: None immediate.  PROCEDURE: The procedure was explained to the patient. The risks and benefits of the procedure were discussed and the patient's questions were addressed. Informed consent was obtained from the patient. The patient was placed supine on the interventional table. Ultrasound confirmed a patent right internal jugular vein. Ultrasound images were obtained for documentation. The right neck was prepped and draped in a sterile fashion. The right neck was anesthetized with 1% lidocaine. Maximal barrier sterile technique was utilized including caps, mask, sterile gowns, sterile gloves, sterile drape, hand hygiene and skin antiseptic. A small incision was made with #11 blade scalpel. A 21 gauge needle directed into the right internal jugular vein with ultrasound guidance. A micropuncture dilator set was placed. A 16 cm Mahurkar catheter was selected. The catheter was advanced over a wire and positioned at the superior cavoatrial junction. Fluoroscopic images were obtained for documentation. Both dialysis lumens were found to aspirate and flush well. The proper amount of heparin was flushed in both lumens. The central venous lumen was flushed with normal saline. Catheter was sutured to skin. FINDINGS: Catheter tip at the superior cavoatrial junction. IMPRESSION: Successful placement of a right jugular non-tunneled dialysis catheter using ultrasound and fluoroscopic guidance. Electronically Signed   By: Markus Daft M.D.   On: 10/08/2019 17:18   IR US Guide Vasc Access Left  Result Date: 10/09/2019 INDICATION: Clotted graft. History of end-stage renal disease currently receiving dialysis via left upper extremity dialysis graft created approximately 6 months ago. Patient report, patient underwent declot procedure approximately 3 months ago. Patient underwent placement of a temporary right jugular approach dialysis catheter yesterday, 10/08/2019 for urgent dialysis and returns today for attempted image guided left upper extremity  dialysis graft thrombolysis. EXAM: 1. FISTULALYSIS 2. ANGIOPLASTY OF VENOUS LIMB AND VENOUS ANASTOMOSIS 3. ULTRASOUND GUIDANCE FOR VENOUS ACCESS COMPARISON:  Image guided right non tunneled dialysis catheter placement-10/08/2019. MEDICATIONS: Heparin 4000 units IV; TPA 4 mg into graft. CONTRAST:  35 cc Omnipaque 3 ANESTHESIA/SEDATION: Moderate (conscious) sedation was employed during this procedure. A total of Versed 2 mg and Fentanyl 100 mcg was administered intravenously. Moderate Sedation Time: 40 minutes. The patient's level of consciousness and vital signs were monitored continuously by radiology nursing throughout the procedure under my direct supervision. FLUOROSCOPY TIME:  4 minutes, 48 seconds (44 mGy) COMPLICATIONS: None immediate. TECHNIQUE: Informed written consent was obtained from the patient after a discussion of the risks, benefits and alternatives to treatment. Questions regarding the procedure were encouraged and answered. A timeout was performed prior to the initiation of the procedure. On physical examination, the existing left arm dialysis graft was negative for palpable pulse or thrill. The skin overlying the  graft was prepped and draped in the usual sterile fashion, and a sterile drape was applied covering the operative field. Maximum barrier sterile technique with sterile gowns and gloves were used for the procedure. Under ultrasound guidance, the dialysis graft was accessed directed towards the venous anastomosis with a micropuncture kit after the overlying soft tissues were anesthetized with 1% lidocaine. An ultrasound image was saved for documentation purposes. The micropuncture sheath was exchange for a 7-French vascular sheath over a guidewire. Over a Benson wire, a Kumpe catheter was advanced centrally and a central venogram was performed. Pull back venogram was performed with the Kumpe catheter. Heparin was administered systemically and TPA was administered via the Kumpe catheter  throughout near the entirety of the venous limb. The venous anastomosis and majority of the venous limb was angioplastied with a 6 mm x 4 cm Conquest balloon. An additional access was obtained directed towards the arterial anastomoses with a micropuncture kit after the overlying soft tissues anesthetized with 1% lidocaine. This allowed for placement of a 6-French vascular sheath. The graft was thrombectomized with several rounds of push-pull mechanical thrombectomy with an occlusion balloon. Flow was restored to the graft as evidenced by restored pulsatility of the graft and brisk blood return from the side arm of the vascular sheath. Shuntograms were performed. Small area of adherent thrombus within the venous stick zone of the upper arm dialysis graft was angioplastied at multiple stations with a 7 mm x 4 cm Conquest balloon. Completion images were obtained. At this point, the procedure was terminated. All wires, catheters and sheaths were removed from the patient. Hemostasis was achieved at both access sites with deployment of a swizzle sutures which will be removed at the patient's next dialysis session. Dressings were placed. The patient tolerated the procedure without immediate postprocedural complication. FINDINGS: The existing left upper extremity AV graft is thrombosed. The venous anastomosis and near the entirety of the venous limb was angioplastied to 6 mm diameter. The graft was successfully thrombectomized using mechanical and pharmacologic means as above. Completion shuntogram demonstrates the venous limb and anastomosis are widely patent. The central venous system, arterial limb and arterial anastomosis are widely patent. IMPRESSION: 1. Technically successful left AV graft thrombolysis. 2. Successful angioplasty of the venous anastomosis and majority of the venous limb to 6 mm diameter. Completion shuntogram demonstrates the venous limb and anastomosis are widely patent. 3. The arterial anastomosis  and arterial limb are widely patent. 4. The central venous system is widely patent. ACCESS: This graft would be amenable to future percutaneous intervention as clinically indicated. Electronically Signed   By: Sandi Mariscal M.D.   On: 10/09/2019 17:32   IR US Guide Vasc Access Right  Result Date: 10/08/2019 INDICATION: 79 year old with end-stage renal disease. Patient has a clotted left arm graft and will need dialysis prior to the declot procedure. EXAM: FLUOROSCOPIC AND ULTRASOUND GUIDED PLACEMENT OF A NON-TUNNELED DIALYSIS CATHETER Physician: Stephan Minister. Henn, MD MEDICATIONS: None ANESTHESIA/SEDATION: None FLUOROSCOPY TIME:  Fluoroscopy Time: 12 seconds, 2 mGy COMPLICATIONS: None immediate. PROCEDURE: The procedure was explained to the patient. The risks and benefits of the procedure were discussed and the patient's questions were addressed. Informed consent was obtained from the patient. The patient was placed supine on the interventional table. Ultrasound confirmed a patent right internal jugular vein. Ultrasound images were obtained for documentation. The right neck was prepped and draped in a sterile fashion. The right neck was anesthetized with 1% lidocaine. Maximal barrier sterile technique was utilized including caps,  mask, sterile gowns, sterile gloves, sterile drape, hand hygiene and skin antiseptic. A small incision was made with #11 blade scalpel. A 21 gauge needle directed into the right internal jugular vein with ultrasound guidance. A micropuncture dilator set was placed. A 16 cm Mahurkar catheter was selected. The catheter was advanced over a wire and positioned at the superior cavoatrial junction. Fluoroscopic images were obtained for documentation. Both dialysis lumens were found to aspirate and flush well. The proper amount of heparin was flushed in both lumens. The central venous lumen was flushed with normal saline. Catheter was sutured to skin. FINDINGS: Catheter tip at the superior cavoatrial  junction. IMPRESSION: Successful placement of a right jugular non-tunneled dialysis catheter using ultrasound and fluoroscopic guidance. Electronically Signed   By: Markus Daft M.D.   On: 10/08/2019 17:18   IR THROMBECTOMY AV FISTULA W/THROMBOLYSIS/PTA INC/SHUNT/IMG LEFT  Result Date: 10/09/2019 INDICATION: Clotted graft. History of end-stage renal disease currently receiving dialysis via left upper extremity dialysis graft created approximately 6 months ago. Patient report, patient underwent declot procedure approximately 3 months ago. Patient underwent placement of a temporary right jugular approach dialysis catheter yesterday, 10/08/2019 for urgent dialysis and returns today for attempted image guided left upper extremity dialysis graft thrombolysis. EXAM: 1. FISTULALYSIS 2. ANGIOPLASTY OF VENOUS LIMB AND VENOUS ANASTOMOSIS 3. ULTRASOUND GUIDANCE FOR VENOUS ACCESS COMPARISON:  Image guided right non tunneled dialysis catheter placement-10/08/2019. MEDICATIONS: Heparin 4000 units IV; TPA 4 mg into graft. CONTRAST:  35 cc Omnipaque 3 ANESTHESIA/SEDATION: Moderate (conscious) sedation was employed during this procedure. A total of Versed 2 mg and Fentanyl 100 mcg was administered intravenously. Moderate Sedation Time: 40 minutes. The patient's level of consciousness and vital signs were monitored continuously by radiology nursing throughout the procedure under my direct supervision. FLUOROSCOPY TIME:  4 minutes, 48 seconds (44 mGy) COMPLICATIONS: None immediate. TECHNIQUE: Informed written consent was obtained from the patient after a discussion of the risks, benefits and alternatives to treatment. Questions regarding the procedure were encouraged and answered. A timeout was performed prior to the initiation of the procedure. On physical examination, the existing left arm dialysis graft was negative for palpable pulse or thrill. The skin overlying the graft was prepped and draped in the usual sterile fashion, and  a sterile drape was applied covering the operative field. Maximum barrier sterile technique with sterile gowns and gloves were used for the procedure. Under ultrasound guidance, the dialysis graft was accessed directed towards the venous anastomosis with a micropuncture kit after the overlying soft tissues were anesthetized with 1% lidocaine. An ultrasound image was saved for documentation purposes. The micropuncture sheath was exchange for a 7-French vascular sheath over a guidewire. Over a Benson wire, a Kumpe catheter was advanced centrally and a central venogram was performed. Pull back venogram was performed with the Kumpe catheter. Heparin was administered systemically and TPA was administered via the Kumpe catheter throughout near the entirety of the venous limb. The venous anastomosis and majority of the venous limb was angioplastied with a 6 mm x 4 cm Conquest balloon. An additional access was obtained directed towards the arterial anastomoses with a micropuncture kit after the overlying soft tissues anesthetized with 1% lidocaine. This allowed for placement of a 6-French vascular sheath. The graft was thrombectomized with several rounds of push-pull mechanical thrombectomy with an occlusion balloon. Flow was restored to the graft as evidenced by restored pulsatility of the graft and brisk blood return from the side arm of the vascular sheath. Shuntograms were performed.  Small area of adherent thrombus within the venous stick zone of the upper arm dialysis graft was angioplastied at multiple stations with a 7 mm x 4 cm Conquest balloon. Completion images were obtained. At this point, the procedure was terminated. All wires, catheters and sheaths were removed from the patient. Hemostasis was achieved at both access sites with deployment of a swizzle sutures which will be removed at the patient's next dialysis session. Dressings were placed. The patient tolerated the procedure without immediate postprocedural  complication. FINDINGS: The existing left upper extremity AV graft is thrombosed. The venous anastomosis and near the entirety of the venous limb was angioplastied to 6 mm diameter. The graft was successfully thrombectomized using mechanical and pharmacologic means as above. Completion shuntogram demonstrates the venous limb and anastomosis are widely patent. The central venous system, arterial limb and arterial anastomosis are widely patent. IMPRESSION: 1. Technically successful left AV graft thrombolysis. 2. Successful angioplasty of the venous anastomosis and majority of the venous limb to 6 mm diameter. Completion shuntogram demonstrates the venous limb and anastomosis are widely patent. 3. The arterial anastomosis and arterial limb are widely patent. 4. The central venous system is widely patent. ACCESS: This graft would be amenable to future percutaneous intervention as clinically indicated. Electronically Signed   By: Sandi Mariscal M.D.   On: 10/09/2019 17:32   Medications: . sodium chloride 250 mL (10/07/19 1134)  . sodium chloride    . phenylephrine (NEO-SYNEPHRINE) Adult infusion 25 mcg/min (10/10/19 1100)   . aspirin EC  81 mg Oral Daily  . atorvastatin  20 mg Oral q1800  . Chlorhexidine Gluconate Cloth  6 each Topical Q0600  . heparin  5,000 Units Subcutaneous Q8H  . levothyroxine  175 mcg Oral QAC breakfast  . lidocaine  1 application Urethral Once  . midodrine  10 mg Oral TID WC  . sodium chloride flush  10-40 mL Intracatheter Q12H  . ticagrelor  90 mg Oral BID

## 2019-10-11 DIAGNOSIS — I63131 Cerebral infarction due to embolism of right carotid artery: Secondary | ICD-10-CM

## 2019-10-11 DIAGNOSIS — R531 Weakness: Secondary | ICD-10-CM

## 2019-10-11 LAB — CBC WITH DIFFERENTIAL/PLATELET
Abs Immature Granulocytes: 0.07 10*3/uL (ref 0.00–0.07)
Basophils Absolute: 0 10*3/uL (ref 0.0–0.1)
Basophils Relative: 0 %
Eosinophils Absolute: 0.1 10*3/uL (ref 0.0–0.5)
Eosinophils Relative: 1 %
HCT: 36.2 % — ABNORMAL LOW (ref 39.0–52.0)
Hemoglobin: 11.8 g/dL — ABNORMAL LOW (ref 13.0–17.0)
Immature Granulocytes: 1 %
Lymphocytes Relative: 4 %
Lymphs Abs: 0.6 10*3/uL — ABNORMAL LOW (ref 0.7–4.0)
MCH: 32.6 pg (ref 26.0–34.0)
MCHC: 32.6 g/dL (ref 30.0–36.0)
MCV: 100 fL (ref 80.0–100.0)
Monocytes Absolute: 1.1 10*3/uL — ABNORMAL HIGH (ref 0.1–1.0)
Monocytes Relative: 8 %
Neutro Abs: 11.2 10*3/uL — ABNORMAL HIGH (ref 1.7–7.7)
Neutrophils Relative %: 86 %
Platelets: 157 10*3/uL (ref 150–400)
RBC: 3.62 MIL/uL — ABNORMAL LOW (ref 4.22–5.81)
RDW: 13 % (ref 11.5–15.5)
WBC: 13 10*3/uL — ABNORMAL HIGH (ref 4.0–10.5)
nRBC: 0 % (ref 0.0–0.2)

## 2019-10-11 LAB — RENAL FUNCTION PANEL
Albumin: 2.7 g/dL — ABNORMAL LOW (ref 3.5–5.0)
Anion gap: 16 — ABNORMAL HIGH (ref 5–15)
BUN: 35 mg/dL — ABNORMAL HIGH (ref 8–23)
CO2: 23 mmol/L (ref 22–32)
Calcium: 8.9 mg/dL (ref 8.9–10.3)
Chloride: 101 mmol/L (ref 98–111)
Creatinine, Ser: 5.45 mg/dL — ABNORMAL HIGH (ref 0.61–1.24)
GFR calc Af Amer: 11 mL/min — ABNORMAL LOW (ref >60)
GFR calc non Af Amer: 9 mL/min — ABNORMAL LOW (ref >60)
Glucose, Bld: 170 mg/dL — ABNORMAL HIGH (ref 70–99)
Phosphorus: 4.5 mg/dL (ref 2.5–4.6)
Potassium: 3.3 mmol/L — ABNORMAL LOW (ref 3.5–5.1)
Sodium: 140 mmol/L (ref 135–145)

## 2019-10-11 MED ORDER — NEPRO/CARBSTEADY PO LIQD
237.0000 mL | Freq: Three times a day (TID) | ORAL | Status: DC
  Administered 2019-10-17 – 2019-10-21 (×8): 237 mL via ORAL

## 2019-10-11 MED ORDER — RENA-VITE PO TABS
1.0000 | ORAL_TABLET | Freq: Every day | ORAL | Status: DC
  Administered 2019-10-11 – 2019-10-23 (×13): 1 via ORAL
  Filled 2019-10-11 (×14): qty 1

## 2019-10-11 MED ORDER — FENTANYL CITRATE (PF) 100 MCG/2ML IJ SOLN
25.0000 ug | Freq: Once | INTRAMUSCULAR | Status: AC
  Administered 2019-10-11: 25 ug via INTRAVENOUS
  Filled 2019-10-11: qty 2

## 2019-10-11 MED ORDER — LIDOCAINE HCL URETHRAL/MUCOSAL 2 % EX GEL
1.0000 "application " | Freq: Once | CUTANEOUS | Status: DC
  Filled 2019-10-11: qty 20

## 2019-10-11 MED ORDER — CALCITRIOL 0.25 MCG PO CAPS
0.5000 ug | ORAL_CAPSULE | ORAL | Status: DC
  Administered 2019-10-13 – 2019-10-22 (×4): 0.5 ug via ORAL
  Filled 2019-10-11: qty 1
  Filled 2019-10-11 (×3): qty 2

## 2019-10-11 MED ORDER — POTASSIUM CHLORIDE CRYS ER 20 MEQ PO TBCR
20.0000 meq | EXTENDED_RELEASE_TABLET | Freq: Once | ORAL | Status: AC
  Administered 2019-10-11: 20 meq via ORAL
  Filled 2019-10-11: qty 1

## 2019-10-11 NOTE — Progress Notes (Signed)
NAME:  Frank [name], MRN:  [id number], DOB:  Jul 11, 1941, LOS: 4 ADMISSION DATE:  10/07/2019, CONSULTATION DATE:  10/07/19 REFERRING MD:  Dr. Rory Percy, Neuro, CHIEF COMPLAINT:  Facial droop   Brief History   79 yo male former smoker presented to ER with weakness, facial droop and slurred speech with concern for CVA.  Seen by neurology.  Outside time window for thrombolytic therapy.  Noted to have low blood pressure and started on phenylephrine.  PCCM asked to admit to ICU.  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 No associated hemorrhage or masseffect.  Micro Data:  SARS CoV2 PCR 1/27 >> negative Influenza PCR 1/27 >> A and B negative  Antimicrobials:    Interim history/subjective:  Patient reports continued intermittent penile pain with associated urge to void. Urojet yesterday didn't do much. C/O poor appetite today, but not feeling badly overall. Otherwise feeling generally better. No acute events o/n  Objective   Blood pressure 94/65, pulse (!) 59, temperature 97.7 F (36.5 C), temperature source Oral, resp. rate (!) 31, height 5\' 7"  (1.702 m), weight 98.4 kg, SpO2 100 %.        Intake/Output Summary (Last 24 hours) at 10/11/2019 1040 Last data filed at 10/11/2019 0800 Gross per 24 hour  Intake 313.89 ml  Output 300 ml  Net 13.89 ml   Filed Weights   10/07/19 1230 10/08/19 2124 10/11/19 0500  Weight: 92.7 kg 98.1 kg 98.4 kg    Examination: General: Chronically ill  appearing elderly male, in NAD HEENT: [place]/AT, MM pink/moist, PERRL,  Neuro: Alert and oriented, bilateral upper extremity strength 5/5 very faintly weaker on left. Bilateral LE 4/5 strength  CV: s1s2 regular rate and rhythm, no murmur, rubs, or gallops. LUE fistual with good thrill PULM:  Clear to ascultation bilaterally, no increased work of breathing  GI: soft, bowel sounds active in all 4 quadrants, non-tender, non-distended Extremities: warm/dry, no edema  Skin: no rashes or lesions  Resolved Hospital Problem list     Assessment & Plan:   Multiple Bilateral scattered watershed infarcts R>Lsecondary to hypoperfusion in the setting of chronic hypotensino and right ICA stenosis  - found to have Rt ICA stenosis - Watershed infarcts related to carotid stenosis P: Neurology following appreciate assistance  Close monitoring of neurologic and hemodynamic status  ECHO with preserved EF, Diastolic function indeterminate  A1C 6.1 Lipid profile WNL Continue ASA and Plavix  Plan for probable cerebral arteriogram with possible R carotid artery intervention mid-next week  Chronic hypotension.- sbp 100 at home per patient P: -Continue home Midodrine  -Neurology recommends SBP goal 120-140; will maintain Phenylephrine drip to maintain SBP goal; pt on flomax at home for bladder spasm? BPH. Won't use now given BP goals, but having urinary retention.  -BP monitoring in ICU setting   DM nephropathy with ESRD. - IR placed R IJ non-tunned HD cath due to clotted left arm access; thrombectomy 1/29  P: Nephrology following  iHD per nephrology  I/O cath q6h PRN  DM poorly controlled DM neuropathy  P: SSI  Monitor  Hold home Neurontin   Hx of hypothyroidism. -TSH WNL P: Continue home Synthroid  Hx of gout. P: Continue home Allopurinol   Dysphagia.  P: Speech to assess   Best practice:  Diet: renal diet DVT prophylaxis: SQ heparin GI prophylaxis: not indicated Mobility: bed  rest Code Status: full code Disposition: ICU Updates: patient interactive   LABS    PULMONARY Recent Labs  Lab 10/07/19 0902  TCO2 31    CBC Recent Labs  Lab 10/09/19 1053 10/10/19 1244 10/11/19 0247  HGB 12.0* 12.7* 11.8*  HCT 37.9* 37.3* 36.2*  WBC 10.7* 11.9* 13.0*  PLT 175 159 157    COAGULATION Recent Labs  Lab 10/07/19 0858  INR 1.0    CARDIAC  No results for input(s): TROPONINI in the last 168 hours. No results for input(s): PROBNP in the last 168 hours.   CHEMISTRY Recent Labs  Lab 10/07/19 0858 10/07/19 0858 10/07/19 0902 10/07/19 0902 10/08/19 0316 10/08/19 0316 10/09/19 1053 10/09/19 1053 10/10/19 1244 10/11/19 0247  NA 137   < > 134*  --  139  --  140  --  137 140  K 4.6   < > 4.4   < > 4.4   < > 4.1   < > 3.3* 3.3*  CL 92*   < > 94*  --  102  --  99  --  98 101  CO2 27  --   --   --  24  --  26  --  25 23  GLUCOSE 163*   < > 158*  --  161*  --  98  --  243* 170*  BUN 54*   < > 49*  --  67*  --  37*  --  22 35*  CREATININE 7.02*   < > 7.10*  --  8.24*  --  5.73*  --  4.05* 5.45*  CALCIUM 9.5  --   --   --  8.6*  --  9.0  --  8.7* 8.9  MG  --   --   --   --   --   --   --   --  1.6*  --   PHOS  --   --   --   --  8.5*  --   --   --  3.1 4.5   < > = values in this interval not displayed.   Estimated Creatinine Clearance: 12.7 mL/min (A) (by C-G formula based on SCr of 5.45 mg/dL (H)).   LIVER Recent Labs  Lab 10/07/19 0858 10/08/19 0316 10/10/19 1244 10/11/19 0247  AST 40  --   --   --   ALT 24  --   --   --   ALKPHOS 99  --   --   --   BILITOT 0.4  --   --   --   PROT 7.3  --   --   --   ALBUMIN 3.5 2.6* 2.7* 2.7*  INR 1.0  --   --   --      INFECTIOUS Recent Labs  Lab 10/07/19 1003 10/07/19 1249  LATICACIDVEN 1.6 1.5     ENDOCRINE CBG (last 3)  Recent Labs    10/08/19 1120  GLUCAP 174*   I have independently seen and examined the patient, reviewed data, and developed an assessment and plan. A total of 33  minutes were spent in critical care assessment and medical decision making.  This critical care time does not reflect procedure time, or teaching time or supervisory time of PA/NP/Med student/Med Resident, etc but could involve care discussion time.  Bonna Gains, MD PhD  10/11/2019, 12:34 PM

## 2019-10-11 NOTE — Progress Notes (Addendum)
[facility] Progress Note   Dialysis Orders: TTS- [place] [facility] 4hrs, BFR400, [id number], EDW 92kg,2K/2.25Ca Access:LU AVG Heparin5000 Calcitriol0.47mg PO qHD   Assessment/Plan: 1. Lt sided weakness w/slurred speech/facial droopmultiple bilateral scattered watershed infarcts R > L - Concern for stroke, Rt ICA stenosis ECHO, lipid panel and A1C ordered. Per neuro. On a phenylephrine drip to keep BP up -ok for heparin  2. ESRD- HD TTS.Had  HD Thursday  in ICU, appreciate  IR assistance for  temp cath 1/28 and then AVG declot 1/29. Orders said to use AVGG 1/30  but flow sheet paper  looks like temp cath was used - though it doesn't say why- need to use AVGG Tuesday K 3.3 - give 20 K today When AVG used successfully , temp cath can be removed. Not sure if pt still needs neo gtt support for BP during HD, if not can be done upstairs on Tuesday.  3. Hypotension/volume- Blood pressure variable on midodrine as OP. /Neo here. Does not appear volume overloaded.  Bed weights well above outpt EDW.  Neuro rec SBP 120-140  4. Anemiaof CKD- Hgb 11.8 No indication for ESA at this time.  5. Secondary Hyperparathyroidism -P ok - binders not resumed yet- continue VDRA.  6. Nutrition- Renal diet w/fluid /mutivit - add nepro  7. DM- per primary 8. Hypothyroidism - on levothyroxine 9. Dysphagia - on regular renal diet  MAmalia Hailey PA-C [facility] 10/11/2019, 11:08 AM   Pt seen, examined and agree w assess/plan as above with additions as indicated.  [facility] 10/11/2019, 5:12 PM     Subjective:  Still with penile pain today. No dysuria. He didn't think AVGG was used after declot. Didn't eat any breakfast.   Objective Vitals:   10/11/19 0730 10/11/19 0800 10/11/19 0900 10/11/19 1000  BP: (!) 135/52 117/68  94/65  Pulse: 84 69 81 (!) 59  Resp: (!) 21 (!) 29 20 (!) 31  Temp:  97.7 F (36.5 C)    TempSrc:  Oral    SpO2:  97% 97% 100% 100%  Weight:      Height:       Physical Exam General: NAD breathing easily on room air. Talkative. Somewhat anxious affect Heart: RRR Lungs: no rales Abdomen: soft NT Extremities: no LE edema SCDs Dialysis Access:  Left upper AVGG +bruit- dressing in place, R temp cath in neck Skin: scattered brusing   Additional Objective Labs: Basic Metabolic Panel: Recent Labs  Lab 10/08/19 0316 10/08/19 0316 10/09/19 1053 10/10/19 1244 10/11/19 0247  NA 139   < > 140 137 140  K 4.4   < > 4.1 3.3* 3.3*  CL 102   < > 99 98 101  CO2 24   < > '26 25 23  '$ GLUCOSE 161*   < > 98 243* 170*  BUN 67*   < > 37* 22 35*  CREATININE 8.24*   < > 5.73* 4.05* 5.45*  CALCIUM 8.6*   < > 9.0 8.7* 8.9  PHOS 8.5*  --   --  3.1 4.5   < > = values in this interval not displayed.   Liver Function Tests: Recent Labs  Lab 10/07/19 0858 10/07/19 0858 10/08/19 0316 10/10/19 1244 10/11/19 0247  AST 40  --   --   --   --   ALT 24  --   --   --   --   ALKPHOS 99  --   --   --   --  BILITOT 0.4  --   --   --   --   PROT 7.3  --   --   --   --   ALBUMIN 3.5   < > 2.6* 2.7* 2.7*   < > = values in this interval not displayed.   No results for input(s): LIPASE, AMYLASE in the last 168 hours. CBC: Recent Labs  Lab 10/07/19 0858 10/07/19 0902 10/08/19 0316 10/08/19 0316 10/09/19 1053 10/10/19 1244 10/11/19 0247  WBC 10.8*   < > 9.2   < > 10.7* 11.9* 13.0*  NEUTROABS 8.6*  --   --   --   --  9.6* 11.2*  HGB 13.4   < > 12.1*   < > 12.0* 12.7* 11.8*  HCT 42.5   < > 37.3*   < > 37.9* 37.3* 36.2*  MCV 102.7*  --  101.6*  --  101.9* 98.2 100.0  PLT 209   < > 204   < > 175 159 157   < > = values in this interval not displayed.   Blood Culture    Component Value Date/Time   SDES BLOOD SITE NOT SPECIFIED 10/07/2019 1103   SPECREQUEST  10/07/2019 1103    BOTTLES DRAWN AEROBIC AND ANAEROBIC Blood Culture adequate volume   CULT  10/07/2019 1103    NO GROWTH 3 DAYS Performed at [facility], [place] [address] [place], [place] [address]    REPTSTATUS PENDING 10/07/2019 1103    Cardiac Enzymes: No results for input(s): CKTOTAL, CKMB, CKMBINDEX, TROPONINI in the last 168 hours. CBG: Recent Labs  Lab 10/07/19 2042 10/07/19 2317 10/08/19 0318 10/08/19 0747 10/08/19 1120  GLUCAP 150* 137* 152* 140* 174*   Iron Studies: No results for input(s): IRON, TIBC, TRANSFERRIN, FERRITIN in the last 72 hours. Lab Results  Component Value Date   INR 1.0 10/07/2019   INR 1.1 01/27/2019   Studies/Results: IR US Guide Vasc Access Left  Result Date: 10/09/2019 INDICATION: Clotted graft. History of end-stage renal disease currently receiving dialysis via left upper extremity dialysis graft created approximately 6 months ago. Patient report, patient underwent declot procedure approximately 3 months ago. Patient underwent placement of a temporary right jugular approach dialysis catheter yesterday, 10/08/2019 for urgent dialysis and returns today for attempted image guided left upper extremity dialysis graft thrombolysis. EXAM: 1. FISTULALYSIS 2. ANGIOPLASTY OF VENOUS LIMB AND VENOUS ANASTOMOSIS 3. ULTRASOUND GUIDANCE FOR VENOUS ACCESS COMPARISON:  Image guided right non tunneled dialysis catheter placement-10/08/2019. MEDICATIONS: Heparin 4000 units IV; TPA 4 mg into graft. CONTRAST:  35 cc Omnipaque 3 ANESTHESIA/SEDATION: Moderate (conscious) sedation was employed during this procedure. A total of Versed 2 mg and Fentanyl 100 mcg was administered intravenously. Moderate Sedation Time: 40 minutes. The patient's level of consciousness and vital signs were monitored continuously by radiology nursing throughout the procedure under my direct supervision. FLUOROSCOPY TIME:  4 minutes, 48 seconds (44 mGy) COMPLICATIONS: None immediate. TECHNIQUE: Informed written consent was obtained from the patient after a discussion of the risks, benefits and alternatives to treatment. Questions regarding the  procedure were encouraged and answered. A timeout was performed prior to the initiation of the procedure. On physical examination, the existing left arm dialysis graft was negative for palpable pulse or thrill. The skin overlying the graft was prepped and draped in the usual sterile fashion, and a sterile drape was applied covering the operative field. Maximum barrier sterile technique with sterile gowns and gloves were used for the procedure. Under ultrasound  guidance, the dialysis graft was accessed directed towards the venous anastomosis with a micropuncture kit after the overlying soft tissues were anesthetized with 1% lidocaine. An ultrasound image was saved for documentation purposes. The micropuncture sheath was exchange for a 7-French vascular sheath over a guidewire. Over a Benson wire, a Kumpe catheter was advanced centrally and a central venogram was performed. Pull back venogram was performed with the Kumpe catheter. Heparin was administered systemically and TPA was administered via the Kumpe catheter throughout near the entirety of the venous limb. The venous anastomosis and majority of the venous limb was angioplastied with a 6 mm x 4 cm Conquest balloon. An additional access was obtained directed towards the arterial anastomoses with a micropuncture kit after the overlying soft tissues anesthetized with 1% lidocaine. This allowed for placement of a 6-French vascular sheath. The graft was thrombectomized with several rounds of push-pull mechanical thrombectomy with an occlusion balloon. Flow was restored to the graft as evidenced by restored pulsatility of the graft and brisk blood return from the side arm of the vascular sheath. Shuntograms were performed. Small area of adherent thrombus within the venous stick zone of the upper arm dialysis graft was angioplastied at multiple stations with a 7 mm x 4 cm Conquest balloon. Completion images were obtained. At this point, the procedure was terminated.  All wires, catheters and sheaths were removed from the patient. Hemostasis was achieved at both access sites with deployment of a swizzle sutures which will be removed at the patient's next dialysis session. Dressings were placed. The patient tolerated the procedure without immediate postprocedural complication. FINDINGS: The existing left upper extremity AV graft is thrombosed. The venous anastomosis and near the entirety of the venous limb was angioplastied to 6 mm diameter. The graft was successfully thrombectomized using mechanical and pharmacologic means as above. Completion shuntogram demonstrates the venous limb and anastomosis are widely patent. The central venous system, arterial limb and arterial anastomosis are widely patent. IMPRESSION: 1. Technically successful left AV graft thrombolysis. 2. Successful angioplasty of the venous anastomosis and majority of the venous limb to 6 mm diameter. Completion shuntogram demonstrates the venous limb and anastomosis are widely patent. 3. The arterial anastomosis and arterial limb are widely patent. 4. The central venous system is widely patent. ACCESS: This graft would be amenable to future percutaneous intervention as clinically indicated. Electronically Signed   By: Sandi Mariscal M.D.   On: 10/09/2019 17:32   IR THROMBECTOMY AV FISTULA W/THROMBOLYSIS/PTA INC/SHUNT/IMG LEFT  Result Date: 10/09/2019 INDICATION: Clotted graft. History of end-stage renal disease currently receiving dialysis via left upper extremity dialysis graft created approximately 6 months ago. Patient report, patient underwent declot procedure approximately 3 months ago. Patient underwent placement of a temporary right jugular approach dialysis catheter yesterday, 10/08/2019 for urgent dialysis and returns today for attempted image guided left upper extremity dialysis graft thrombolysis. EXAM: 1. FISTULALYSIS 2. ANGIOPLASTY OF VENOUS LIMB AND VENOUS ANASTOMOSIS 3. ULTRASOUND GUIDANCE FOR  VENOUS ACCESS COMPARISON:  Image guided right non tunneled dialysis catheter placement-10/08/2019. MEDICATIONS: Heparin 4000 units IV; TPA 4 mg into graft. CONTRAST:  35 cc Omnipaque 3 ANESTHESIA/SEDATION: Moderate (conscious) sedation was employed during this procedure. A total of Versed 2 mg and Fentanyl 100 mcg was administered intravenously. Moderate Sedation Time: 40 minutes. The patient's level of consciousness and vital signs were monitored continuously by radiology nursing throughout the procedure under my direct supervision. FLUOROSCOPY TIME:  4 minutes, 48 seconds (44 mGy) COMPLICATIONS: None immediate. TECHNIQUE: Informed written consent was  obtained from the patient after a discussion of the risks, benefits and alternatives to treatment. Questions regarding the procedure were encouraged and answered. A timeout was performed prior to the initiation of the procedure. On physical examination, the existing left arm dialysis graft was negative for palpable pulse or thrill. The skin overlying the graft was prepped and draped in the usual sterile fashion, and a sterile drape was applied covering the operative field. Maximum barrier sterile technique with sterile gowns and gloves were used for the procedure. Under ultrasound guidance, the dialysis graft was accessed directed towards the venous anastomosis with a micropuncture kit after the overlying soft tissues were anesthetized with 1% lidocaine. An ultrasound image was saved for documentation purposes. The micropuncture sheath was exchange for a 7-French vascular sheath over a guidewire. Over a Benson wire, a Kumpe catheter was advanced centrally and a central venogram was performed. Pull back venogram was performed with the Kumpe catheter. Heparin was administered systemically and TPA was administered via the Kumpe catheter throughout near the entirety of the venous limb. The venous anastomosis and majority of the venous limb was angioplastied with a 6 mm x  4 cm Conquest balloon. An additional access was obtained directed towards the arterial anastomoses with a micropuncture kit after the overlying soft tissues anesthetized with 1% lidocaine. This allowed for placement of a 6-French vascular sheath. The graft was thrombectomized with several rounds of push-pull mechanical thrombectomy with an occlusion balloon. Flow was restored to the graft as evidenced by restored pulsatility of the graft and brisk blood return from the side arm of the vascular sheath. Shuntograms were performed. Small area of adherent thrombus within the venous stick zone of the upper arm dialysis graft was angioplastied at multiple stations with a 7 mm x 4 cm Conquest balloon. Completion images were obtained. At this point, the procedure was terminated. All wires, catheters and sheaths were removed from the patient. Hemostasis was achieved at both access sites with deployment of a swizzle sutures which will be removed at the patient's next dialysis session. Dressings were placed. The patient tolerated the procedure without immediate postprocedural complication. FINDINGS: The existing left upper extremity AV graft is thrombosed. The venous anastomosis and near the entirety of the venous limb was angioplastied to 6 mm diameter. The graft was successfully thrombectomized using mechanical and pharmacologic means as above. Completion shuntogram demonstrates the venous limb and anastomosis are widely patent. The central venous system, arterial limb and arterial anastomosis are widely patent. IMPRESSION: 1. Technically successful left AV graft thrombolysis. 2. Successful angioplasty of the venous anastomosis and majority of the venous limb to 6 mm diameter. Completion shuntogram demonstrates the venous limb and anastomosis are widely patent. 3. The arterial anastomosis and arterial limb are widely patent. 4. The central venous system is widely patent. ACCESS: This graft would be amenable to future  percutaneous intervention as clinically indicated. Electronically Signed   By: Sandi Mariscal M.D.   On: 10/09/2019 17:32   Medications: . sodium chloride 250 mL (10/07/19 1134)  . sodium chloride    . phenylephrine (NEO-SYNEPHRINE) Adult infusion 5 mcg/min (10/11/19 0800)   . aspirin EC  81 mg Oral Daily  . atorvastatin  20 mg Oral q1800  . Chlorhexidine Gluconate Cloth  6 each Topical Q0600  . heparin  5,000 Units Subcutaneous Q8H  . levothyroxine  175 mcg Oral QAC breakfast  . midodrine  10 mg Oral TID WC  . sodium chloride flush  10-40 mL Intracatheter Q12H  .  ticagrelor  90 mg Oral BID

## 2019-10-12 DIAGNOSIS — N186 End stage renal disease: Secondary | ICD-10-CM

## 2019-10-12 DIAGNOSIS — I6340 Cerebral infarction due to embolism of unspecified cerebral artery: Secondary | ICD-10-CM

## 2019-10-12 LAB — CULTURE, BLOOD (ROUTINE X 2)
Culture: NO GROWTH
Culture: NO GROWTH
Special Requests: ADEQUATE
Special Requests: ADEQUATE

## 2019-10-12 LAB — CBC WITH DIFFERENTIAL/PLATELET
Abs Immature Granulocytes: 0.06 10*3/uL (ref 0.00–0.07)
Basophils Absolute: 0 10*3/uL (ref 0.0–0.1)
Basophils Relative: 0 %
Eosinophils Absolute: 0.4 10*3/uL (ref 0.0–0.5)
Eosinophils Relative: 4 %
HCT: 36.6 % — ABNORMAL LOW (ref 39.0–52.0)
Hemoglobin: 11.7 g/dL — ABNORMAL LOW (ref 13.0–17.0)
Immature Granulocytes: 1 %
Lymphocytes Relative: 12 %
Lymphs Abs: 1.2 10*3/uL (ref 0.7–4.0)
MCH: 32.4 pg (ref 26.0–34.0)
MCHC: 32 g/dL (ref 30.0–36.0)
MCV: 101.4 fL — ABNORMAL HIGH (ref 80.0–100.0)
Monocytes Absolute: 0.9 10*3/uL (ref 0.1–1.0)
Monocytes Relative: 9 %
Neutro Abs: 7.6 10*3/uL (ref 1.7–7.7)
Neutrophils Relative %: 74 %
Platelets: 171 10*3/uL (ref 150–400)
RBC: 3.61 MIL/uL — ABNORMAL LOW (ref 4.22–5.81)
RDW: 13.2 % (ref 11.5–15.5)
WBC: 10.2 10*3/uL (ref 4.0–10.5)
nRBC: 0 % (ref 0.0–0.2)

## 2019-10-12 LAB — URINALYSIS, COMPLETE (UACMP) WITH MICROSCOPIC
Bilirubin Urine: NEGATIVE
Glucose, UA: NEGATIVE mg/dL
Ketones, ur: NEGATIVE mg/dL
Nitrite: NEGATIVE
Protein, ur: 100 mg/dL — AB
Specific Gravity, Urine: 1.025 (ref 1.005–1.030)
Squamous Epithelial / HPF: NONE SEEN (ref 0–5)
WBC, UA: >50 WBC/hpf (ref 0–5)
pH: 6 (ref 5.0–8.0)

## 2019-10-12 LAB — RENAL FUNCTION PANEL
Albumin: 2.6 g/dL — ABNORMAL LOW (ref 3.5–5.0)
Anion gap: 16 — ABNORMAL HIGH (ref 5–15)
BUN: 47 mg/dL — ABNORMAL HIGH (ref 8–23)
CO2: 24 mmol/L (ref 22–32)
Calcium: 9.1 mg/dL (ref 8.9–10.3)
Chloride: 103 mmol/L (ref 98–111)
Creatinine, Ser: 7.52 mg/dL — ABNORMAL HIGH (ref 0.61–1.24)
GFR calc Af Amer: 7 mL/min — ABNORMAL LOW (ref >60)
GFR calc non Af Amer: 6 mL/min — ABNORMAL LOW (ref >60)
Glucose, Bld: 96 mg/dL (ref 70–99)
Phosphorus: 6.8 mg/dL — ABNORMAL HIGH (ref 2.5–4.6)
Potassium: 4.1 mmol/L (ref 3.5–5.1)
Sodium: 143 mmol/L (ref 135–145)

## 2019-10-12 LAB — GLUCOSE, CAPILLARY
Glucose-Capillary: 159 mg/dL — ABNORMAL HIGH (ref 70–99)
Glucose-Capillary: 117 mg/dL — ABNORMAL HIGH (ref 70–99)

## 2019-10-12 LAB — PLATELET INHIBITION P2Y12: Platelet Function  P2Y12: 170 [PRU] — ABNORMAL LOW (ref 182–335)

## 2019-10-12 MED ORDER — SEVELAMER CARBONATE 800 MG PO TABS
1600.0000 mg | ORAL_TABLET | Freq: Three times a day (TID) | ORAL | Status: DC
  Administered 2019-10-12: 1600 mg via ORAL
  Filled 2019-10-12: qty 2

## 2019-10-12 MED ORDER — CIPROFLOXACIN IN D5W 400 MG/200ML IV SOLN
400.0000 mg | INTRAVENOUS | Status: DC
  Administered 2019-10-12 – 2019-10-14 (×3): 400 mg via INTRAVENOUS
  Filled 2019-10-12 (×4): qty 200

## 2019-10-12 NOTE — Progress Notes (Signed)
NAME:  Frank [name], MRN:  [id number], DOB:  Sep 14, 1940, LOS: 5 ADMISSION DATE:  10/07/2019, CONSULTATION DATE:  10/07/19 REFERRING MD:  Dr. Rory Percy, Neuro, CHIEF COMPLAINT:  Facial droop   Brief History   79 yo male former smoker presented to ER with weakness, facial droop and slurred speech with concern for CVA.  Seen by neurology.  Outside time window for thrombolytic therapy.  Noted to have low blood pressure and started on phenylephrine.  PCCM asked to admit to ICU.  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 No associated hemorrhage or masseffect.  Micro Data:  SARS CoV2 PCR 1/27 >> negative Influenza PCR 1/27 >> A and B negative  Antimicrobials:    Interim history/subjective:  Denies headache, chest pain, dyspnea.  Not much of an appetite.  Objective   Blood pressure (!) 104/48, pulse 81, temperature 98 F (36.7 C), temperature source Oral, resp. rate 20, height 5\' 7"  (1.702 m), weight 98.4 kg, SpO2 98 %.        Intake/Output Summary (Last 24 hours) at 10/12/2019 0736 Last data filed at 10/12/2019 0600 Gross per 24 hour  Intake 28.89 ml  Output 500 ml  Net -471.11 ml   Filed Weights   10/07/19 1230 10/08/19 2124 10/11/19 0500  Weight: 92.7 kg 98.1 kg 98.4 kg    Examination:  General - alert Eyes - pupils reactive ENT - no sinus tenderness, no stridor Cardiac - regular rate/rhythm, no murmur Chest - equal breath sounds b/l, no wheezing or rales Abdomen - soft,  non tender, + bowel sounds Extremities - no cyanosis, clubbing, or edema Skin - bruise on right arm Neuro - follows commands Psych - normal mood and behavior   Resolved Hospital Problem list   Dysphagia  Assessment & Plan:   Multiple Bilateral scattered watershed infarcts R>Lsecondary to hypoperfusion in the setting of chronic hypotensino and right ICA stenosis  - found to have Rt ICA stenosis - Watershed infarcts related to carotid stenosis P: Neurology following Continue ASA, plavix Plan for probable cerebral arteriogram with possible R carotid artery intervention mid-next week  Chronic hypotension.- sbp 100 at home per patient P: Continue midodrine  DM nephropathy with ESRD. - IR placed R IJ non-tunned HD cath due to clotted left arm access; thrombectomy 1/29  P: iHD per renal  DM poorly controlled DM neuropathy P: SSI Continue neurontin  Hx of hypothyroidism. -TSH WNL P: Continue synthroid  Hx of gout. P: Continue home Allopurinol    Best practice:  Diet: renal diet DVT prophylaxis: SQ heparin GI prophylaxis: not indicated Mobility: bed rest Code Status: full code Disposition: to med surge  To [facility] 2/02 and PCCM off.  LABS    PULMONARY Recent Labs  Lab 10/07/19 0902  TCO2 31    CBC Recent Labs  Lab 10/09/19 1053 10/10/19 1244 10/11/19 0247  HGB 12.0* 12.7* 11.8*  HCT 37.9* 37.3* 36.2*  WBC 10.7* 11.9* 13.0*  PLT 175 159 157    COAGULATION Recent Labs  Lab 10/07/19 0858  INR  1.0    CARDIAC  No results for input(s): TROPONINI in the last 168 hours. No results for input(s): PROBNP in the last 168 hours.   CHEMISTRY Recent Labs  Lab 10/07/19 0858 10/07/19 0858 10/07/19 0902 10/07/19 0902 10/08/19 0316 10/08/19 0316 10/09/19 1053 10/09/19 1053 10/10/19 1244 10/11/19 0247  NA 137   < > 134*  --  139  --  140  --  137 140  K 4.6   < > 4.4   < > 4.4   < > 4.1   < > 3.3* 3.3*  CL 92*   < > 94*  --  102  --  99  --  98  101  CO2 27  --   --   --  24  --  26  --  25 23  GLUCOSE 163*   < > 158*  --  161*  --  98  --  243* 170*  BUN 54*   < > 49*  --  67*  --  37*  --  22 35*  CREATININE 7.02*   < > 7.10*  --  8.24*  --  5.73*  --  4.05* 5.45*  CALCIUM 9.5  --   --   --  8.6*  --  9.0  --  8.7* 8.9  MG  --   --   --   --   --   --   --   --  1.6*  --   PHOS  --   --   --   --  8.5*  --   --   --  3.1 4.5   < > = values in this interval not displayed.   Estimated Creatinine Clearance: 12.7 mL/min (A) (by C-G formula based on SCr of 5.45 mg/dL (H)).   LIVER Recent Labs  Lab 10/07/19 0858 10/08/19 0316 10/10/19 1244 10/11/19 0247  AST 40  --   --   --   ALT 24  --   --   --   ALKPHOS 99  --   --   --   BILITOT 0.4  --   --   --   PROT 7.3  --   --   --   ALBUMIN 3.5 2.6* 2.7* 2.7*  INR 1.0  --   --   --      INFECTIOUS Recent Labs  Lab 10/07/19 1003 10/07/19 1249  LATICACIDVEN 1.6 1.5     ENDOCRINE CBG (last 3)  No results for input(s): GLUCAP in the last 72 hours.   Chesley Mires, MD [facility] Pulmonary/Critical Care 10/12/2019, 7:38 AM

## 2019-10-12 NOTE — Progress Notes (Signed)
[facility] Progress Note   Dialysis Orders: TTS- [facility] 4hrs, BFR400, [id number], EDW 92kg,2K/2.25Ca Access:LU AVG Heparin5000 Calcitriol0.38mcg PO qHD   Assessment/Plan: 1. Lt sided weakness w/slurred speech/facial droopmultiple bilateral scattered watershed infarcts R > L - Concern for stroke, Rt ICA stenosis ECHO, lipid panel and A1C ordered. Per neuro. Ok for heparin.  On midodrine - will try to avoid drops in BP with HD. 2. ESRD- HD TTS.Had  HD Thursday  in ICU, appreciate  IR assistance for  temp cath 1/28 and then AVG declot 1/29. Orders said to use AVGG 1/30  but flow sheet paper  looks like temp cath was used - though it doesn't say why- need to use AVGG Tuesday.  3. Hypotension/volume- Blood pressure variable on midodrine as OP.  Does not appear volume overloaded.  Bed weights well above outpt EDW.  Neuro rec SBP 120-140 but in light of his BPs in the 100-150s on floor I have written a parameter to keep BP > 100 at HD; will use low temp dialysate to help prevent hypotension, limit UF if needed.  4. Anemiaof CKD- Hgb 11s. No indication for ESA at this time.  5. Secondary Hyperparathyroidism -P 6.8 now, resume binder renvela 2 TIDAC (was 3 outpt) - continue VDRA.  6. Nutrition- Renal diet w/fluid /mutivit - add nepro  7. DM- per primary 8. Hypothyroidism - on levothyroxine 9. Dysphagia - on regular renal diet  Jannifer Hick MD [facility] Pager [phone]    Subjective:  Still with penile pain today. No dysuria. No new issues.   Objective Vitals:   10/12/19 0700 10/12/19 0800 10/12/19 0931 10/12/19 1150  BP: (!) 106/57 124/63 129/61 132/62  Pulse: 79 84 83 76  Resp: 20 (!) 21 16 20   Temp:  98.1 F (36.7 C) 98.8 F (37.1 C) 97.9 F (36.6 C)  TempSrc:  Oral Oral Oral  SpO2: 99% 100% 100% 98%  Weight:   95.1 kg   Height:   5\' 7"  (1.702 m)    Physical Exam General: NAD breathing easily on room air.  Talkative. Somewhat anxious affect Heart: RRR Lungs: no rales, normal WOB Abdomen: soft NT Extremities: no LE edema Dialysis Access:  Left upper AVGG +bruit- dressing in place, R temp cath in neck Skin: scattered brusing   Additional Objective Labs: Basic Metabolic Panel: Recent Labs  Lab 10/10/19 1244 10/11/19 0247 10/12/19 0701  NA 137 140 143  K 3.3* 3.3* 4.1  CL 98 101 103  CO2 25 23 24   GLUCOSE 243* 170* 96  BUN 22 35* 47*  CREATININE 4.05* 5.45* 7.52*  CALCIUM 8.7* 8.9 9.1  PHOS 3.1 4.5 6.8*   Liver Function Tests: Recent Labs  Lab 10/07/19 0858 10/08/19 0316 10/10/19 1244 10/11/19 0247 10/12/19 0701  AST 40  --   --   --   --   ALT 24  --   --   --   --   ALKPHOS 99  --   --   --   --   BILITOT 0.4  --   --   --   --   PROT 7.3  --   --   --   --   ALBUMIN 3.5   < > 2.7* 2.7* 2.6*   < > = values in this interval not displayed.   No results for input(s): LIPASE, AMYLASE in the last 168 hours. CBC: Recent Labs  Lab 10/08/19 0316 10/08/19 0316 10/09/19 1053 10/09/19 1053 10/10/19  1244 10/11/19 0247 10/12/19 0701  WBC 9.2   < > 10.7*   < > 11.9* 13.0* 10.2  NEUTROABS  --   --   --   --  9.6* 11.2* 7.6  HGB 12.1*   < > 12.0*   < > 12.7* 11.8* 11.7*  HCT 37.3*   < > 37.9*   < > 37.3* 36.2* 36.6*  MCV 101.6*  --  101.9*  --  98.2 100.0 101.4*  PLT 204   < > 175   < > 159 157 171   < > = values in this interval not displayed.   Blood Culture    Component Value Date/Time   SDES BLOOD SITE NOT SPECIFIED 10/07/2019 1103   SPECREQUEST  10/07/2019 1103    BOTTLES DRAWN AEROBIC AND ANAEROBIC Blood Culture adequate volume   CULT  10/07/2019 1103    NO GROWTH 5 DAYS Performed at [facility], [place] [address], [place], [place] [address]    REPTSTATUS 10/12/2019 FINAL 10/07/2019 1103    Cardiac Enzymes: No results for input(s): CKTOTAL, CKMB, CKMBINDEX, TROPONINI in the last 168 hours. CBG: Recent Labs  Lab 10/07/19 2317 10/08/19 0318  10/08/19 0747 10/08/19 1120 10/12/19 1154  GLUCAP 137* 152* 140* 174* 159*   Iron Studies: No results for input(s): IRON, TIBC, TRANSFERRIN, FERRITIN in the last 72 hours. Lab Results  Component Value Date   INR 1.0 10/07/2019   INR 1.1 01/27/2019   Studies/Results: No results found. Medications: . sodium chloride 250 mL (10/07/19 1134)   . aspirin EC  81 mg Oral Daily  . atorvastatin  20 mg Oral q1800  . [START ON 10/13/2019] calcitRIOL  0.5 mcg Oral Q T,Th,Sa-HD  . Chlorhexidine Gluconate Cloth  6 each Topical Q0600  . feeding supplement (NEPRO CARB STEADY)  237 mL Oral TID WC  . heparin  5,000 Units Subcutaneous Q8H  . levothyroxine  175 mcg Oral QAC breakfast  . lidocaine  1 application Urethral Once  . midodrine  10 mg Oral TID WC  . multivitamin  1 tablet Oral QHS  . sodium chloride flush  10-40 mL Intracatheter Q12H  . ticagrelor  90 mg Oral BID

## 2019-10-12 NOTE — Progress Notes (Signed)
NIR.  Patient with history of CVA (multiple B scattered WM infarcts, R>L, watershed territories) thought to be secondary to chronic hypotension and proximal right ICA stenosis. At this time, it is recommended that patient undergo endovascular revascularization of his proximal right ICA stenosis to prevent future strokes- Plan for image-guided cerebral arteriogram with possible revascularization/angioplasty/stent placement of proximal right ICA stenosis tentatively for 10/14/2019 with Dr. Estanislado Pandy. Patient has been seen/consented for procedure.  P2Y12 170 PRU this AM. Per Dr. Estanislado Pandy, continue taking Brilinta 90 mg twice daily and Aspirin 81 mg once daily, repeat P2Y12 tomorrow AM (order placed).  UA today revealed many bacteria, large amounts of leukocytes, and >50 WBCs- indicative of UTI. Discussed with Dr. Estanislado Pandy who recommends treating with IV antibiotics x 7 days and proceeding with procedure. Patient to begin Ciprofloxacin 400 mg IV Q12 hours x 7 days (order placed).  Additional pre-procedure orders to follow. Please call NIR with questions/concerns.   Bea Graff Gyan Cambre, PA-C 10/12/2019, 1:53 PM

## 2019-10-13 ENCOUNTER — Other Ambulatory Visit: Payer: Self-pay | Admitting: Student

## 2019-10-13 LAB — CBC WITH DIFFERENTIAL/PLATELET
Abs Immature Granulocytes: 0.06 10*3/uL (ref 0.00–0.07)
Basophils Absolute: 0 10*3/uL (ref 0.0–0.1)
Basophils Relative: 0 %
Eosinophils Absolute: 0.3 10*3/uL (ref 0.0–0.5)
Eosinophils Relative: 3 %
HCT: 37.8 % — ABNORMAL LOW (ref 39.0–52.0)
Hemoglobin: 12.1 g/dL — ABNORMAL LOW (ref 13.0–17.0)
Immature Granulocytes: 0 %
Lymphocytes Relative: 10 %
Lymphs Abs: 1.3 10*3/uL (ref 0.7–4.0)
MCH: 32.6 pg (ref 26.0–34.0)
MCHC: 32 g/dL (ref 30.0–36.0)
MCV: 101.9 fL — ABNORMAL HIGH (ref 80.0–100.0)
Monocytes Absolute: 1.1 10*3/uL — ABNORMAL HIGH (ref 0.1–1.0)
Monocytes Relative: 9 %
Neutro Abs: 10.6 10*3/uL — ABNORMAL HIGH (ref 1.7–7.7)
Neutrophils Relative %: 78 %
Platelets: 191 10*3/uL (ref 150–400)
RBC: 3.71 MIL/uL — ABNORMAL LOW (ref 4.22–5.81)
RDW: 13.2 % (ref 11.5–15.5)
WBC: 13.4 10*3/uL — ABNORMAL HIGH (ref 4.0–10.5)
nRBC: 0 % (ref 0.0–0.2)

## 2019-10-13 LAB — GLUCOSE, CAPILLARY: Glucose-Capillary: 118 mg/dL — ABNORMAL HIGH (ref 70–99)

## 2019-10-13 LAB — RENAL FUNCTION PANEL
Albumin: 2.8 g/dL — ABNORMAL LOW (ref 3.5–5.0)
Anion gap: 18 — ABNORMAL HIGH (ref 5–15)
BUN: 64 mg/dL — ABNORMAL HIGH (ref 8–23)
CO2: 22 mmol/L (ref 22–32)
Calcium: 9 mg/dL (ref 8.9–10.3)
Chloride: 100 mmol/L (ref 98–111)
Creatinine, Ser: 9.04 mg/dL — ABNORMAL HIGH (ref 0.61–1.24)
GFR calc Af Amer: 6 mL/min — ABNORMAL LOW (ref >60)
GFR calc non Af Amer: 5 mL/min — ABNORMAL LOW (ref >60)
Glucose, Bld: 119 mg/dL — ABNORMAL HIGH (ref 70–99)
Phosphorus: 6.9 mg/dL — ABNORMAL HIGH (ref 2.5–4.6)
Potassium: 4.1 mmol/L (ref 3.5–5.1)
Sodium: 140 mmol/L (ref 135–145)

## 2019-10-13 LAB — PLATELET INHIBITION P2Y12: Platelet Function  P2Y12: 213 [PRU] (ref 182–335)

## 2019-10-13 MED ORDER — KETOCONAZOLE 2 % EX SHAM
1.0000 "application " | MEDICATED_SHAMPOO | CUTANEOUS | Status: DC | PRN
  Filled 2019-10-13: qty 120

## 2019-10-13 MED ORDER — ACETAMINOPHEN 325 MG PO TABS
ORAL_TABLET | ORAL | Status: AC
  Filled 2019-10-13: qty 2

## 2019-10-13 MED ORDER — ASPIRIN EC 81 MG PO TBEC
81.0000 mg | DELAYED_RELEASE_TABLET | Freq: Every day | ORAL | Status: DC
  Administered 2019-10-15 – 2019-10-24 (×10): 81 mg via ORAL
  Filled 2019-10-13 (×10): qty 1

## 2019-10-13 MED ORDER — SEVELAMER CARBONATE 800 MG PO TABS
2400.0000 mg | ORAL_TABLET | Freq: Three times a day (TID) | ORAL | Status: DC
  Administered 2019-10-13 – 2019-10-24 (×30): 2400 mg via ORAL
  Filled 2019-10-13 (×34): qty 3

## 2019-10-13 MED ORDER — SODIUM CHLORIDE 0.9 % IV SOLN: INTRAVENOUS | Status: DC

## 2019-10-13 MED ORDER — TICAGRELOR 90 MG PO TABS: 90.0000 mg | ORAL_TABLET | Freq: Once | ORAL | Status: DC

## 2019-10-13 MED ORDER — HEPARIN SODIUM (PORCINE) 1000 UNIT/ML IJ SOLN
INTRAMUSCULAR | Status: AC
  Administered 2019-10-13: 1600 [IU] via INTRAVENOUS_CENTRAL
  Filled 2019-10-13: qty 2

## 2019-10-13 MED ORDER — CEFAZOLIN SODIUM-DEXTROSE 2-4 GM/100ML-% IV SOLN
2.0000 g | INTRAVENOUS | Status: DC
  Filled 2019-10-13: qty 100

## 2019-10-13 MED ORDER — TICAGRELOR 90 MG PO TABS
90.0000 mg | ORAL_TABLET | Freq: Two times a day (BID) | ORAL | Status: DC
  Administered 2019-10-14 – 2019-10-15 (×2): 90 mg via ORAL
  Filled 2019-10-13 (×2): qty 1

## 2019-10-13 MED ORDER — CALCITRIOL 0.5 MCG PO CAPS
ORAL_CAPSULE | ORAL | Status: AC
  Filled 2019-10-13: qty 1

## 2019-10-13 MED ORDER — CLOTRIMAZOLE 1 % EX CREA
TOPICAL_CREAM | Freq: Two times a day (BID) | CUTANEOUS | Status: DC
  Filled 2019-10-13 (×2): qty 15

## 2019-10-13 MED ORDER — ASPIRIN EC 81 MG PO TBEC: 81.0000 mg | DELAYED_RELEASE_TABLET | Freq: Once | ORAL | Status: DC

## 2019-10-13 MED ORDER — MIDODRINE HCL 5 MG PO TABS
ORAL_TABLET | ORAL | Status: AC
  Filled 2019-10-13: qty 2

## 2019-10-13 MED ORDER — NIMODIPINE 30 MG PO CAPS
0.0000 mg | ORAL_CAPSULE | ORAL | Status: DC
  Filled 2019-10-13: qty 2

## 2019-10-13 NOTE — Progress Notes (Signed)
Physical Therapy Treatment Patient Details Name: Frank [name] MRN: [id number] DOB: 30-Aug-1941 Today's Date: 10/13/2019    History of Present Illness 79 yo admitted with slurred speech and facial droop with multifocal watershed infarcts. PMhx: ESRD, DM, HTN, HLD, neuropathy, PAD, depression, gout    PT Comments    Patient seen for mobility progression. Continue to progress as tolerated with anticipated d/c to SNF for further skilled PT services.     Follow Up Recommendations  SNF;Supervision for mobility/OOB     Equipment Recommendations  Rolling walker with 5" wheels;3in1 (PT)    Recommendations for Other Services       Precautions / Restrictions Precautions Precautions: Fall    Mobility  Bed Mobility Overal bed mobility: Needs Assistance Bed Mobility: Supine to Sit       Sit to supine: Mod assist;HOB elevated   General bed mobility comments: cues for sequencing; assist to bring hips to EOB and to elevate trunk into sitting  Transfers Overall transfer level: Needs assistance Equipment used: Rolling walker (2 wheeled) Transfers: Sit to/from Stand Sit to Stand: From elevated surface;Min assist;+2 physical assistance;+2 safety/equipment         General transfer comment: cues for safe hand placement; assist to power up into standing   Ambulation/Gait Ambulation/Gait assistance: Min assist;+2 safety/equipment Gait Distance (Feet): 20 Feet Assistive device: Rolling walker (2 wheeled) Gait Pattern/deviations: Step-to pattern;Decreased step length - right;Decreased step length - left;Decreased dorsiflexion - right;Decreased dorsiflexion - left;Shuffle;Trunk flexed Gait velocity: decreased   General Gait Details: multimodal cues for increasd bilat step lengths; assist to steady and guide RW    Stairs             Wheelchair Mobility    Modified Rankin (Stroke Patients Only)       Balance Overall balance assessment: Needs assistance;History of  Falls Sitting-balance support: No upper extremity supported;Feet supported Sitting balance-Leahy Scale: Good     Standing balance support: Bilateral upper extremity supported;During functional activity Standing balance-Leahy Scale: Poor                              Cognition Arousal/Alertness: Awake/alert Behavior During Therapy: (tangential) Overall Cognitive Status: Impaired/Different from baseline Area of Impairment: Attention;Problem solving;Safety/judgement;Following commands                   Current Attention Level: Focused   Following Commands: Follows one step commands with increased time Safety/Judgement: Decreased awareness of safety   Problem Solving: Slow processing;Requires verbal cues;Requires tactile cues General Comments: very tangential requiring cues to redirect to task      Exercises      General Comments        Pertinent Vitals/Pain Pain Assessment: Faces Faces Pain Scale: Hurts little more Pain Location: groin  Pain Descriptors / Indicators: Discomfort Pain Intervention(s): Limited activity within patient's tolerance    Home Living                      Prior Function            PT Goals (current goals can now be found in the care plan section) Progress towards PT goals: Progressing toward goals    Frequency    Min 3X/week      PT Plan Current plan remains appropriate    Co-evaluation              AM-PAC PT "6 Clicks" Mobility   Outcome  Measure  Help needed turning from your back to your side while in a flat bed without using bedrails?: A Little Help needed moving from lying on your back to sitting on the side of a flat bed without using bedrails?: A Lot Help needed moving to and from a bed to a chair (including a wheelchair)?: A Little Help needed standing up from a chair using your arms (e.g., wheelchair or bedside chair)?: A Little Help needed to walk in hospital room?: A Little Help needed  climbing 3-5 steps with a railing? : A Lot 6 Click Score: 16    End of Session Equipment Utilized During Treatment: Gait belt Activity Tolerance: Patient tolerated treatment well Patient left: in chair;with call bell/phone within reach;Other (comment)(no chair alarm pad present; RN reports "he won't get up") Nurse Communication: Mobility status PT Visit Diagnosis: Other abnormalities of gait and mobility (R26.89);Difficulty in walking, not elsewhere classified (R26.2)     Time: [id number] PT Time Calculation (min) (ACUTE ONLY): 26 min  Charges:  $Gait Training: 23-37 mins                     Earney Navy, PTA Acute Rehabilitation Services Pager: [phone] Office: [phone]     Darliss Cheney 10/13/2019, 5:20 PM

## 2019-10-13 NOTE — Progress Notes (Signed)
[facility] Progress Note   Dialysis Orders: TTS- [facility] 4hrs, BFR400, [id number], EDW 92kg,2K/2.25Ca Access:LU AVG Heparin5000 Calcitriol0.50mcg PO qHD   Assessment/Plan: 1. Lt sided weakness w/slurred speech/facial droopmultiple bilateral scattered watershed infarcts R > L - Concern for stroke, Rt ICA stenosis ECHO, lipid panel and A1C ordered. Per neuro. Ok for heparin.  On midodrine - will try to avoid drops in BP with HD. Plan is for cerebral arteriogram and intervention of R ICA on 2/3.   2. ESRD- HD TTS.Had  HD Thursday  in ICU, appreciate  IR assistance for  temp cath 1/28 and then AVG declot 1/29. Orders said to use AVGG 1/30  but flow sheet paper  looks like temp cath was used and again today the order for AVG was missed so he's using [facility] currently.  Will d/w nursing staff next treatment.   3. Hypotension/volume- Blood pressure variable on midodrine as OP.  Does not appear volume overloaded.  Bed weights well above outpt EDW.  Neuro rec SBP 120-140 but in light of his BPs in the 100-150s on floor I have written a parameter to keep BP > 100 at HD; will use low temp dialysate to help prevent hypotension, limit UF if needed.  Will need standing weight today post HD.   4. Anemiaof CKD- Hgb 11-12s. No indication for ESA at this time.  5. Secondary Hyperparathyroidism -P 6.9 now, resume binder renvela 3 TIDAC - continue VDRA.  6. Nutrition- Renal diet w/fluid /mutivit - add nepro  7. DM- per primary 8. Hypothyroidism - on levothyroxine 9. Dysphagia - on regular renal diet 10. UTI/urethritis:  On ciprofloxacin now. Change dose to ESRD appropriate dose.   Jannifer Hick MD [facility] Pager [phone]    Subjective:  Still with penile pain today. No dysuria. No new issues.   Objective Vitals:   10/13/19 0730 10/13/19 0800 10/13/19 0830 10/13/19 0900  BP: (!) 144/81 107/62 108/63 106/61  Pulse: (!) 104 94 (!) 103 97  Resp:       Temp:      TempSrc:      SpO2:      Weight:      Height:       Physical Exam General: NAD breathing easily on room air. Talkative. Somewhat anxious affect Heart: RRR Lungs: no rales, normal WOB Abdomen: soft NT Extremities: no LE edema Dialysis Access:  Left upper AVGG +bruit- dressing in place, R temp cath in neck Skin: scattered brusing   Additional Objective Labs: Basic Metabolic Panel: Recent Labs  Lab 10/11/19 0247 10/12/19 0701 10/13/19 0515  NA 140 143 140  K 3.3* 4.1 4.1  CL 101 103 100  CO2 23 24 22   GLUCOSE 170* 96 119*  BUN 35* 47* 64*  CREATININE 5.45* 7.52* 9.04*  CALCIUM 8.9 9.1 9.0  PHOS 4.5 6.8* 6.9*   Liver Function Tests: Recent Labs  Lab 10/07/19 0858 10/08/19 0316 10/11/19 0247 10/12/19 0701 10/13/19 0515  AST 40  --   --   --   --   ALT 24  --   --   --   --   ALKPHOS 99  --   --   --   --   BILITOT 0.4  --   --   --   --   PROT 7.3  --   --   --   --   ALBUMIN 3.5   < > 2.7* 2.6* 2.8*   < > = values  in this interval not displayed.   No results for input(s): LIPASE, AMYLASE in the last 168 hours. CBC: Recent Labs  Lab 10/09/19 1053 10/09/19 1053 10/10/19 1244 10/10/19 1244 10/11/19 0247 10/12/19 0701 10/13/19 0515  WBC 10.7*   < > 11.9*   < > 13.0* 10.2 13.4*  NEUTROABS  --   --  9.6*   < > 11.2* 7.6 10.6*  HGB 12.0*   < > 12.7*   < > 11.8* 11.7* 12.1*  HCT 37.9*   < > 37.3*   < > 36.2* 36.6* 37.8*  MCV 101.9*  --  98.2  --  100.0 101.4* 101.9*  PLT 175   < > 159   < > 157 171 191   < > = values in this interval not displayed.   Blood Culture    Component Value Date/Time   SDES BLOOD SITE NOT SPECIFIED 10/07/2019 1103   SPECREQUEST  10/07/2019 1103    BOTTLES DRAWN AEROBIC AND ANAEROBIC Blood Culture adequate volume   CULT  10/07/2019 1103    NO GROWTH 5 DAYS Performed at [facility], [place] [address] [place], [place] [address]    REPTSTATUS 10/12/2019 FINAL 10/07/2019 1103    Cardiac Enzymes: No  results for input(s): CKTOTAL, CKMB, CKMBINDEX, TROPONINI in the last 168 hours. CBG: Recent Labs  Lab 10/08/19 0747 10/08/19 1120 10/12/19 1154 10/12/19 2140 10/13/19 0635  GLUCAP 140* 174* 159* 117* 118*   Iron Studies: No results for input(s): IRON, TIBC, TRANSFERRIN, FERRITIN in the last 72 hours. Lab Results  Component Value Date   INR 1.0 10/07/2019   INR 1.1 01/27/2019   Studies/Results: No results found. Medications: . sodium chloride 250 mL (10/07/19 1134)  . ciprofloxacin 400 mg (10/12/19 1946)   . midodrine      . aspirin EC  81 mg Oral Daily  . atorvastatin  20 mg Oral q1800  . calcitRIOL  0.5 mcg Oral Q T,Th,Sa-HD  . Chlorhexidine Gluconate Cloth  6 each Topical Q0600  . feeding supplement (NEPRO CARB STEADY)  237 mL Oral TID WC  . heparin  5,000 Units Subcutaneous Q8H  . levothyroxine  175 mcg Oral QAC breakfast  . lidocaine  1 application Urethral Once  . midodrine  10 mg Oral TID WC  . multivitamin  1 tablet Oral QHS  . sevelamer carbonate  1,600 mg Oral TID WC  . sodium chloride flush  10-40 mL Intracatheter Q12H  . ticagrelor  90 mg Oral BID

## 2019-10-13 NOTE — Progress Notes (Signed)
Pt./ family requesting pts. Home med, Lotrimin. On call for [facility] paged to make aware.

## 2019-10-13 NOTE — Plan of Care (Signed)

## 2019-10-13 NOTE — Progress Notes (Signed)
Occupational Therapy Treatment Patient Details Name: Frank [name] MRN: [id number] DOB: Nov 23, 1940 Today's Date: 10/13/2019    History of present illness 79 yo admitted with slurred speech and facial droop with multifocal watershed infarcts. PMhx: ESRD, DM, HTN, HLD, neuropathy, PAD, depression, gout   OT comments  Patient seated in chair upon arrival, reports feeling tired and wanting to get back to bed. Patient agreeable to seated grooming and hygiene with set up assist for denture care and washing his face. Patient require x2 attempts to stand from recliner with max cues for body mechanics and mod A to boost up to standing. Patient require mod A to pivot back to bed with increased time as patient is tangential and gets distracted during transfer. Patient require mod A to lift LEs onto bed, patient request to have [place] elevated to lay down trunk. Patient progressing towards acute OT goals, will continue to follow.    Follow Up Recommendations  SNF;Supervision/Assistance - 24 hour    Equipment Recommendations  3 in 1 bedside commode       Precautions / Restrictions Precautions Precautions: Fall Restrictions Weight Bearing Restrictions: No       Mobility Bed Mobility Overal bed mobility: Needs Assistance Bed Mobility: Sit to Supine       Sit to supine: Mod assist;HOB elevated   General bed mobility comments: mod A to lift LEs onto bed, request HOB elevated when laying down due to difficulty breathing  Transfers Overall transfer level: Needs assistance Equipment used: Rolling walker (2 wheeled) Transfers: Sit to/from Stand Sit to Stand: Mod assist         General transfer comment: require x2 attempts to stand from chair with max cues for body mechanics    Balance Overall balance assessment: Needs assistance;History of Falls Sitting-balance support: No upper extremity supported;Feet supported Sitting balance-Leahy Scale: Good     Standing balance support: Bilateral  upper extremity supported;During functional activity Standing balance-Leahy Scale: Poor Standing balance comment: bil UE support on RW                           ADL either performed or assessed with clinical judgement   ADL Overall ADL's : Needs assistance/impaired     Grooming: Oral care;Wash/dry face;Set up;Sitting                   Toilet Transfer: Moderate assistance;BSC;RW;Stand-pivot;Cueing for safety;Cueing for sequencing Toilet Transfer Details (indicate cue type and reason): simulated from recliner to bed, mod to max cues for body mechanics         Functional mobility during ADLs: Moderate assistance;Rolling walker;Cueing for sequencing;Cueing for safety General ADL Comments: pt limited by cognitive deficits, decreased activity tolerance and ability to complete BADL               Cognition Arousal/Alertness: Awake/alert Behavior During Therapy: (tangential) Overall Cognitive Status: Impaired/Different from baseline Area of Impairment: Attention;Problem solving;Safety/judgement;Following commands                   Current Attention Level: Focused   Following Commands: Follows one step commands with increased time Safety/Judgement: Decreased awareness of safety   Problem Solving: Slow processing;Requires verbal cues;Requires tactile cues General Comments: very tangential requiring cues to redirect to task                   Pertinent Vitals/ Pain       Pain Assessment: Faces Faces Pain Scale: Hurts  little more Pain Location: groin  Pain Descriptors / Indicators: Grimacing Pain Intervention(s): Limited activity within patient's tolerance      Frequency  Min 2X/week        Progress Toward Goals  OT Goals(current goals can now be found in the care plan section)  Progress towards OT goals: Progressing toward goals  Acute Rehab OT Goals Patient Stated Goal: return home OT Goal Formulation: With patient Time For Goal  Achievement: 10/23/19 Potential to Achieve Goals: Good ADL Goals Pt Will Perform Grooming: with min guard assist;standing Pt Will Perform Lower Body Bathing: with min assist;sitting/lateral leans;sit to/from stand Pt Will Perform Lower Body Dressing: with min assist;sitting/lateral leans;sit to/from stand Pt Will Transfer to Toilet: with min guard assist;ambulating;regular height toilet;grab bars Additional ADL Goal #1: Pt will follow multistep cues for BADL task completion with <3 VC's for successful completion  Plan Discharge plan remains appropriate       AM-PAC OT "6 Clicks" Daily Activity     Outcome Measure   Help from another person eating meals?: A Little Help from another person taking care of personal grooming?: A Little Help from another person toileting, which includes using toliet, bedpan, or urinal?: A Lot Help from another person bathing (including washing, rinsing, drying)?: A Lot Help from another person to put on and taking off regular upper body clothing?: A Little Help from another person to put on and taking off regular lower body clothing?: A Lot 6 Click Score: 15    End of Session Equipment Utilized During Treatment: Gait belt;Rolling walker  OT Visit Diagnosis: Unsteadiness on feet (R26.81);Other abnormalities of gait and mobility (R26.89);Muscle weakness (generalized) (M62.81);Other symptoms and signs involving cognitive function   Activity Tolerance Patient tolerated treatment well   Patient Left in bed;with call bell/phone within reach;with bed alarm set   Nurse Communication Mobility status        Time: [id number] OT Time Calculation (min): 28 min  Charges: OT General Charges $OT Visit: 1 Visit OT Treatments $Self Care/Home Management : 23-37 mins  Shon Millet OT OT office: [place] 10/13/2019, 2:01 PM

## 2019-10-13 NOTE — Progress Notes (Addendum)
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 Patient awake and alert sitting in bed eating lunch. Complains of genital pain, stable.   Allergies: Ace inhibitors, Angiotensin receptor blockers, Ivp dye [iodinated diagnostic agents], and Adhesive [tape]  Medications: Prior to Admission medications   Medication Sig Start Date End Date Taking? Authorizing Provider  allopurinol (ZYLOPRIM) 100 MG tablet Take 1 tablet (100 mg total) by mouth daily. 06/03/19  Yes McLean-Scocuzza, Nino Glow, MD  aspirin EC 81 MG tablet Take 81 mg daily by mouth.   Yes [provider]  atorvastatin (LIPITOR) 20 MG tablet Take 20 mg by mouth daily at 6 PM.    Yes [provider]  calcitRIOL (ROCALTROL) 0.5 MCG capsule Take 0.5 mcg by mouth See admin instructions. Only on dialysis days: Tuesday, Thursdays and Saturdays.   Yes [provider]  docusate sodium (COLACE) 100 MG capsule Take 200 mg by mouth daily as needed.    Yes [provider]  gabapentin (NEURONTIN) 300 MG capsule Take 1 capsule (300 mg total) by mouth daily. At 3 PM 05/08/19  Yes McLean-Scocuzza, Nino Glow, MD  Influenza Vac A&B Surf Ant Adj (FLUAD IM) Inject 1 each into the muscle once. High dose   Yes [provider]  ketoconazole (NIZORAL) 2 % shampoo Apply 1 application topically 2 (two) times a week. Patient taking differently: Apply 1 application topically as needed for irritation.  06/11/19  Yes McLean-Scocuzza, Nino Glow, MD  levothyroxine (SYNTHROID) 150 MCG tablet Take 150 mcg by mouth daily before breakfast.   Yes [provider]  lidocaine-prilocaine (EMLA) cream Apply 1 application topically See admin instructions. Apply to access  site 1 to 2 hours before dialysis. Cover with occlusive dressing. 04/08/19  Yes [provider]  midodrine (PROAMATINE) 10 MG tablet Take 1 tablet (10 mg total) by mouth 3 (three) times a week. Take within 1/2 hour prior to dialysis start 09/16/19  Yes Baldwin [place], PA-C  multivitamin (RENA-VIT) TABS tablet Take 1 tablet daily by mouth.   Yes [provider]  ondansetron (ZOFRAN) 4 MG tablet Take 4 mg by mouth every 8 (eight) hours as needed for nausea or vomiting.  01/19/19  Yes [provider]  PRESCRIPTION MEDICATION Take 1 oz by mouth See admin instructions. Liquid protein: Liquifil on dialisys days, Tuesday, Thursday and Saturday.   Yes [provider]  sevelamer carbonate (RENVELA) 2.4 g PACK Take by mouth 3 (three) times daily with meals. And with any snacks    Yes [provider]  Zinc Oxide (BALMEX EX) Apply 1 application topically daily as needed (to irritated areas of skin).    Yes [provider]     Vital Signs: BP (!) 114/57 (BP Location: Right Wrist)   Pulse 90   Temp (!) 97.5 F (36.4 C) (Oral)   Resp 17   Ht '5\' 7"'  (1.702 m)   Wt 208 lb 8.9 oz (94.6 kg)   SpO2 100%   BMI 32.66 kg/m   Physical Exam Vitals and nursing note reviewed.  Constitutional:      General: He is not in acute distress.    Appearance: Normal appearance.  Pulmonary:     Effort: Pulmonary effort is normal. No respiratory distress.  Skin:  General: Skin is warm and dry.  Neurological:     Mental Status: He is alert.     Comments: Alert, awake, and oriented x3. Speech and comprehension intact. Can spontaneously move all extremities. No pronator drift.     Imaging: IR US Guide Vasc Access Left  Result Date: 10/09/2019 INDICATION: Clotted graft. History of end-stage renal disease currently receiving dialysis via left upper extremity dialysis graft created approximately 6 months ago. Patient report, patient underwent declot procedure  approximately 3 months ago. Patient underwent placement of a temporary right jugular approach dialysis catheter yesterday, 10/08/2019 for urgent dialysis and returns today for attempted image guided left upper extremity dialysis graft thrombolysis. EXAM: 1. FISTULALYSIS 2. ANGIOPLASTY OF VENOUS LIMB AND VENOUS ANASTOMOSIS 3. ULTRASOUND GUIDANCE FOR VENOUS ACCESS COMPARISON:  Image guided right non tunneled dialysis catheter placement-10/08/2019. MEDICATIONS: Heparin 4000 units IV; TPA 4 mg into graft. CONTRAST:  35 cc Omnipaque 3 ANESTHESIA/SEDATION: Moderate (conscious) sedation was employed during this procedure. A total of Versed 2 mg and Fentanyl 100 mcg was administered intravenously. Moderate Sedation Time: 40 minutes. The patient's level of consciousness and vital signs were monitored continuously by radiology nursing throughout the procedure under my direct supervision. FLUOROSCOPY TIME:  4 minutes, 48 seconds (44 mGy) COMPLICATIONS: None immediate. TECHNIQUE: Informed written consent was obtained from the patient after a discussion of the risks, benefits and alternatives to treatment. Questions regarding the procedure were encouraged and answered. A timeout was performed prior to the initiation of the procedure. On physical examination, the existing left arm dialysis graft was negative for palpable pulse or thrill. The skin overlying the graft was prepped and draped in the usual sterile fashion, and a sterile drape was applied covering the operative field. Maximum barrier sterile technique with sterile gowns and gloves were used for the procedure. Under ultrasound guidance, the dialysis graft was accessed directed towards the venous anastomosis with a micropuncture kit after the overlying soft tissues were anesthetized with 1% lidocaine. An ultrasound image was saved for documentation purposes. The micropuncture sheath was exchange for a 7-French vascular sheath over a guidewire. Over a Benson wire, a Kumpe  catheter was advanced centrally and a central venogram was performed. Pull back venogram was performed with the Kumpe catheter. Heparin was administered systemically and TPA was administered via the Kumpe catheter throughout near the entirety of the venous limb. The venous anastomosis and majority of the venous limb was angioplastied with a 6 mm x 4 cm Conquest balloon. An additional access was obtained directed towards the arterial anastomoses with a micropuncture kit after the overlying soft tissues anesthetized with 1% lidocaine. This allowed for placement of a 6-French vascular sheath. The graft was thrombectomized with several rounds of push-pull mechanical thrombectomy with an occlusion balloon. Flow was restored to the graft as evidenced by restored pulsatility of the graft and brisk blood return from the side arm of the vascular sheath. Shuntograms were performed. Small area of adherent thrombus within the venous stick zone of the upper arm dialysis graft was angioplastied at multiple stations with a 7 mm x 4 cm Conquest balloon. Completion images were obtained. At this point, the procedure was terminated. All wires, catheters and sheaths were removed from the patient. Hemostasis was achieved at both access sites with deployment of a swizzle sutures which will be removed at the patient's next dialysis session. Dressings were placed. The patient tolerated the procedure without immediate postprocedural complication. FINDINGS: The existing left upper extremity AV graft is thrombosed. The venous  anastomosis and near the entirety of the venous limb was angioplastied to 6 mm diameter. The graft was successfully thrombectomized using mechanical and pharmacologic means as above. Completion shuntogram demonstrates the venous limb and anastomosis are widely patent. The central venous system, arterial limb and arterial anastomosis are widely patent. IMPRESSION: 1. Technically successful left AV graft thrombolysis. 2.  Successful angioplasty of the venous anastomosis and majority of the venous limb to 6 mm diameter. Completion shuntogram demonstrates the venous limb and anastomosis are widely patent. 3. The arterial anastomosis and arterial limb are widely patent. 4. The central venous system is widely patent. ACCESS: This graft would be amenable to future percutaneous intervention as clinically indicated. Electronically Signed   By: Sandi Mariscal M.D.   On: 10/09/2019 17:32   IR THROMBECTOMY AV FISTULA W/THROMBOLYSIS/PTA INC/SHUNT/IMG LEFT  Result Date: 10/09/2019 INDICATION: Clotted graft. History of end-stage renal disease currently receiving dialysis via left upper extremity dialysis graft created approximately 6 months ago. Patient report, patient underwent declot procedure approximately 3 months ago. Patient underwent placement of a temporary right jugular approach dialysis catheter yesterday, 10/08/2019 for urgent dialysis and returns today for attempted image guided left upper extremity dialysis graft thrombolysis. EXAM: 1. FISTULALYSIS 2. ANGIOPLASTY OF VENOUS LIMB AND VENOUS ANASTOMOSIS 3. ULTRASOUND GUIDANCE FOR VENOUS ACCESS COMPARISON:  Image guided right non tunneled dialysis catheter placement-10/08/2019. MEDICATIONS: Heparin 4000 units IV; TPA 4 mg into graft. CONTRAST:  35 cc Omnipaque 3 ANESTHESIA/SEDATION: Moderate (conscious) sedation was employed during this procedure. A total of Versed 2 mg and Fentanyl 100 mcg was administered intravenously. Moderate Sedation Time: 40 minutes. The patient's level of consciousness and vital signs were monitored continuously by radiology nursing throughout the procedure under my direct supervision. FLUOROSCOPY TIME:  4 minutes, 48 seconds (44 mGy) COMPLICATIONS: None immediate. TECHNIQUE: Informed written consent was obtained from the patient after a discussion of the risks, benefits and alternatives to treatment. Questions regarding the procedure were encouraged and  answered. A timeout was performed prior to the initiation of the procedure. On physical examination, the existing left arm dialysis graft was negative for palpable pulse or thrill. The skin overlying the graft was prepped and draped in the usual sterile fashion, and a sterile drape was applied covering the operative field. Maximum barrier sterile technique with sterile gowns and gloves were used for the procedure. Under ultrasound guidance, the dialysis graft was accessed directed towards the venous anastomosis with a micropuncture kit after the overlying soft tissues were anesthetized with 1% lidocaine. An ultrasound image was saved for documentation purposes. The micropuncture sheath was exchange for a 7-French vascular sheath over a guidewire. Over a Benson wire, a Kumpe catheter was advanced centrally and a central venogram was performed. Pull back venogram was performed with the Kumpe catheter. Heparin was administered systemically and TPA was administered via the Kumpe catheter throughout near the entirety of the venous limb. The venous anastomosis and majority of the venous limb was angioplastied with a 6 mm x 4 cm Conquest balloon. An additional access was obtained directed towards the arterial anastomoses with a micropuncture kit after the overlying soft tissues anesthetized with 1% lidocaine. This allowed for placement of a 6-French vascular sheath. The graft was thrombectomized with several rounds of push-pull mechanical thrombectomy with an occlusion balloon. Flow was restored to the graft as evidenced by restored pulsatility of the graft and brisk blood return from the side arm of the vascular sheath. Shuntograms were performed. Small area of adherent thrombus within the venous  stick zone of the upper arm dialysis graft was angioplastied at multiple stations with a 7 mm x 4 cm Conquest balloon. Completion images were obtained. At this point, the procedure was terminated. All wires, catheters and sheaths  were removed from the patient. Hemostasis was achieved at both access sites with deployment of a swizzle sutures which will be removed at the patient's next dialysis session. Dressings were placed. The patient tolerated the procedure without immediate postprocedural complication. FINDINGS: The existing left upper extremity AV graft is thrombosed. The venous anastomosis and near the entirety of the venous limb was angioplastied to 6 mm diameter. The graft was successfully thrombectomized using mechanical and pharmacologic means as above. Completion shuntogram demonstrates the venous limb and anastomosis are widely patent. The central venous system, arterial limb and arterial anastomosis are widely patent. IMPRESSION: 1. Technically successful left AV graft thrombolysis. 2. Successful angioplasty of the venous anastomosis and majority of the venous limb to 6 mm diameter. Completion shuntogram demonstrates the venous limb and anastomosis are widely patent. 3. The arterial anastomosis and arterial limb are widely patent. 4. The central venous system is widely patent. ACCESS: This graft would be amenable to future percutaneous intervention as clinically indicated. Electronically Signed   By: Sandi Mariscal M.D.   On: 10/09/2019 17:32    Labs:  CBC: Recent Labs    10/10/19 1244 10/11/19 0247 10/12/19 0701 10/13/19 0515  WBC 11.9* 13.0* 10.2 13.4*  HGB 12.7* 11.8* 11.7* 12.1*  HCT 37.3* 36.2* 36.6* 37.8*  PLT 159 157 171 191    COAGS: Recent Labs    01/27/19 0521 10/07/19 0858  INR 1.1 1.0  APTT  --  30    BMP: Recent Labs    10/10/19 1244 10/11/19 0247 10/12/19 0701 10/13/19 0515  NA 137 140 143 140  K 3.3* 3.3* 4.1 4.1  CL 98 101 103 100  CO2 '25 23 24 22  ' GLUCOSE 243* 170* 96 119*  BUN 22 35* 47* 64*  CALCIUM 8.7* 8.9 9.1 9.0  CREATININE 4.05* 5.45* 7.52* 9.04*  GFRNONAA 13* 9* 6* 5*  GFRAA 15* 11* 7* 6*    LIVER FUNCTION TESTS: Recent Labs    01/22/19 0541 01/23/19 0349  01/28/19 0437 05/11/19 1037 06/10/19 1208 06/10/19 1208 10/07/19 0858 10/08/19 0316 10/10/19 1244 10/11/19 0247 10/12/19 0701 10/13/19 0515  BILITOT 0.5  --   --  0.3 0.4  --  0.4  --   --   --   --   --   AST 48*  --   --  63* 47*  --  40  --   --   --   --   --   ALT 27  --   --  88* 67*  --  24  --   --   --   --   --   ALKPHOS 68  --   --  168* 169*  --  99  --   --   --   --   --   PROT 6.4*  --   --  7.2 8.3  --  7.3  --   --   --   --   --   ALBUMIN 3.0*   < >   < > 4.0 4.3   < > 3.5   < > 2.7* 2.7* 2.6* 2.8*   < > = values in this interval not displayed.    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 Plan for image-guided cerebral arteriogram with possible revascularization (angiplasty/stent placement) of proximal right ICA stenosis tentatively for Wednesday 10/14/2019 with Dr. Estanislado Pandy. Patient has been seen/consented for procedure. Patient will be NPO at midnight. Hold Heparin at midnight. P2Y12 213 PRU this AM- discussed with Dr. Estanislado Pandy who recommends continuing with DAPT as directed (continue taking Brilinta 90 mg twice daily and Aspirin 81 mg once daily), repeat P2Y12 tomorrow AM (order placed for this), and if not therapeutic will proceed with IV anticoagulation during procedure. INR 1.0 10/07/2019. COVID negative on admission 10/07/2019. Continue taking Ciprofloxacin 400 mg Q12H for 7 days for UTI. Further plans per [facility]/neurology/nephrology- appreciate and agree with management. NIR to follow.  Dr. Estanislado Pandy spoke with patient's daughter, Adelfa Koh, via telephone to update on tomorrow's planned procedure. All questions answered and concerns addressed.   Electronically Signed: Earley Abide, PA-C 10/13/2019, 11:42 AM   I spent a total of 35 Minutes at the the patient's bedside AND on the patient's hospital floor or unit, greater  than 50% of which was counseling/coordinating care for proximal right ICA stenosis.

## 2019-10-13 NOTE — Progress Notes (Signed)
PROGRESS NOTE    Frank [name]  [id number] DOB: 12/15/1940 DOA: 10/07/2019 PCP: McLean-Scocuzza, Nino Glow, MD     Brief Narrative:  Frank [name] is a 79 year old male with past medical history significant for ESRD on hemodialysis, diabetes, hypertension, hyperlipidemia, hypothyroidism, peripheral artery disease, gout, depression who presented to the emergency department on 1/27 with chief complaint of weakness, facial droop, slurred speech with concern for CVA.  Neurology was consulted.  Patient was admitted to ICU due to patient's low blood pressure and was started on phenylephrine.  With improvement off vasopressor, patient was transferred to [facility] on 10/13/2019.  Plan is to undergo cerebral arteriogram with possible right ICA revascularization/stent with IR 2/3.   New events last 24 hours / Subjective: Patient seen in hemodialysis today.  Denies any acute physical complaints today.  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  Follow-up with [facility] neurology in 4 weeks -Aspirin and Brilinta, Lipitor -Planning for cerebral arteriogram with possible right ICA revascularization/stent with  IR 2/3  Chronic hypotension -Now off vasopressor -Continue midodrine  ESRD on HD TTS -Nephrology following for dialysis -Temporary dialysis catheter placed 1/28, AVG declot 1/29   Type 2 diabetes, well controlled -Hemoglobin A1c 6.7  Hypothyroidism -Continue Synthroid  UTI, not POA, not CAUTI related -Cipro    DVT prophylaxis: Subq hep Code Status: Full Family Communication: None at bedside  Disposition Plan: Patient is from home prior to admission. Currently in-hospital treatment needed due to stroke treatment. Barrier(s) to discharge include medical stabilization, IR procedure scheduled and suspect patient will discharge to SNF as recommended per PT/OT.    Consultants:   PCCM admission  Nephrology  Neurology  IR   Antimicrobials:  Anti-infectives (From admission, onward)   Start     Dose/Rate Route Frequency Ordered Stop   10/12/19 1600  ciprofloxacin (CIPRO) IVPB 400 mg     400 mg 200 mL/hr over 60 Minutes Intravenous Every 24 hours 10/12/19 1354 10/19/19 1559        Objective: Vitals:   10/13/19 0800 10/13/19 0830 10/13/19 0900 10/13/19 0930  BP: 107/62 108/63 106/61 (!) 102/56  Pulse: 94 (!) 103 97 82  Resp:      Temp:      TempSrc:      SpO2:      Weight:      Height:        Intake/Output Summary (Last 24 hours) at 10/13/2019 0949 Last data filed at 10/13/2019 0039 Gross per 24 hour  Intake 240 ml  Output 100 ml  Net 140 ml   Filed Weights   10/12/19 0931 10/13/19 0441 10/13/19 0647  Weight: 95.1 kg 94.5 kg 95.9 kg    Examination:  General exam: Appears calm and comfortable  Respiratory system: Clear to auscultation.  Respiratory effort normal. No respiratory distress. No conversational dyspnea.  Cardiovascular system: S1 & S2 heard, RRR. No murmurs. No pedal edema. Gastrointestinal system: Abdomen is nondistended, soft and nontender. Normal bowel sounds heard. Central nervous system: Alert and oriented. Speech clear.  Extremities:  Symmetric in appearance  Skin: No rashes, lesions or ulcers on exposed skin  Psychiatry: Judgement and insight appear normal. Mood & affect appropriate.   Data Reviewed: I have personally reviewed following labs and imaging studies  CBC: Recent Labs  Lab 10/07/19 0858 10/07/19 0902 10/09/19 1053 10/10/19 1244 10/11/19 0247 10/12/19 0701 10/13/19 0515  WBC 10.8*   < > 10.7* 11.9* 13.0* 10.2 13.4*  NEUTROABS 8.6*  --   --  9.6* 11.2* 7.6 10.6*  HGB 13.4   < > 12.0* 12.7* 11.8* 11.7* 12.1*  HCT 42.5   < > 37.9* 37.3* 36.2* 36.6* 37.8*  MCV 102.7*   < > 101.9* 98.2 100.0 101.4* 101.9*  PLT 209   < > 175 159 157 171 191   < > = values in this interval not displayed.   Basic Metabolic Panel: Recent Labs  Lab 10/08/19 0316 10/08/19 0316 10/09/19 1053 10/10/19 1244 10/11/19 0247 10/12/19 0701 10/13/19 0515  NA 139   < > 140 137 140 143 140  K 4.4   < > 4.1 3.3* 3.3* 4.1 4.1  CL 102   < > 99 98 101 103 100  CO2 24   < > 26 25 23 24 22   GLUCOSE 161*   < > 98 243* 170* 96 119*  BUN 67*   < > 37* 22 35* 47* 64*  CREATININE 8.24*   < > 5.73* 4.05* 5.45* 7.52* 9.04*  CALCIUM 8.6*   < > 9.0 8.7* 8.9 9.1 9.0  MG  --   --   --  1.6*  --   --   --   PHOS 8.5*  --   --  3.1 4.5 6.8* 6.9*   < > = values in this interval not displayed.   GFR: Estimated Creatinine Clearance: 7.5 mL/min (A) (by C-G formula based on SCr of 9.04 mg/dL (H)). Liver Function Tests: Recent Labs  Lab 10/07/19 0858 10/07/19 0858 10/08/19 0316 10/10/19 1244 10/11/19 0247 10/12/19 0701 10/13/19 0515  AST 40  --   --   --   --   --   --   ALT 24  --   --   --   --   --   --   ALKPHOS 99  --   --   --   --   --   --   BILITOT 0.4  --   --   --   --   --   --   PROT 7.3  --   --   --   --   --   --   ALBUMIN 3.5   < > 2.6* 2.7* 2.7* 2.6* 2.8*   < > = values in this interval not displayed.   No results for input(s): LIPASE, AMYLASE in the last 168 hours. Recent Labs  Lab 10/07/19 1003  AMMONIA 20    Coagulation Profile: Recent Labs  Lab 10/07/19 0858  INR 1.0   Cardiac Enzymes: No results for input(s): CKTOTAL, CKMB, CKMBINDEX, TROPONINI in the last 168 hours. BNP (last 3 results) No results for input(s): PROBNP in the last 8760 hours. HbA1C: No results for input(s): HGBA1C in the last 72 hours. CBG: Recent Labs  Lab 10/08/19 0747 10/08/19 1120 10/12/19 1154 10/12/19 2140 10/13/19 0635  GLUCAP 140* 174* 159* 117* 118*   Lipid Profile: No results for input(s): CHOL, HDL, LDLCALC, TRIG, CHOLHDL, LDLDIRECT in the last 72 hours. Thyroid Function Tests: No results for input(s): TSH, T4TOTAL, FREET4, T3FREE, THYROIDAB in the last 72 hours. Anemia Panel: No results for input(s): VITAMINB12, FOLATE, FERRITIN, TIBC, IRON, RETICCTPCT in the last 72 hours. Sepsis Labs: Recent Labs  Lab 10/07/19 1003 10/07/19 1249  LATICACIDVEN 1.6 1.5    Recent Results (from the past 240 hour(s))  Respiratory Panel by RT PCR (Flu A&B, Covid) - Nasopharyngeal Swab     Status: None   Collection Time: 10/07/19  9:39 AM   Specimen: Nasopharyngeal Swab  Result Value Ref Range Status   SARS Coronavirus 2 by RT PCR NEGATIVE NEGATIVE Final    Comment: (NOTE) SARS-CoV-2 target nucleic acids are NOT DETECTED. The SARS-CoV-2 RNA is generally detectable in upper respiratoy specimens during the acute phase of infection. The lowest concentration of SARS-CoV-2 viral copies this assay can detect is 131 copies/mL. A negative result does not preclude SARS-Cov-2 infection and should not be used as the sole basis for treatment or other patient management decisions. A negative result may occur with  improper specimen collection/handling, submission of specimen other than nasopharyngeal swab, presence of viral mutation(s) within the areas targeted by this assay, and inadequate number of viral copies (<131 copies/mL). A negative result must be combined with clinical observations, patient history, and  epidemiological information. The expected result is Negative. Fact Sheet for Patients:  PinkCheek.be Fact Sheet for Healthcare Providers:  GravelBags.it This test is not yet ap proved or cleared by the [place] FDA and  has been authorized for detection and/or diagnosis of SARS-CoV-2 by FDA under an Emergency Use Authorization (EUA). This EUA will remain  in effect (meaning this test can be used) for the duration of the COVID-19 declaration under Section 564(b)(1) of the Act, 21 U.S.C. section 360bbb-3(b)(1), unless the authorization is terminated or revoked sooner.    Influenza A by PCR NEGATIVE NEGATIVE Final   Influenza B by PCR NEGATIVE NEGATIVE Final    Comment: (NOTE) The Xpert Xpress SARS-CoV-2/FLU/RSV assay is intended as an aid in  the diagnosis of influenza from Nasopharyngeal swab specimens and  should not be used as a sole basis for treatment. Nasal washings and  aspirates are unacceptable for Xpert Xpress SARS-CoV-2/FLU/RSV  testing. Fact Sheet for Patients: PinkCheek.be Fact Sheet for Healthcare Providers: GravelBags.it This test is not yet approved or cleared by the [place] FDA and  has been authorized for detection and/or diagnosis of SARS-CoV-2 by  FDA under an Emergency Use Authorization (EUA). This EUA will remain  in effect (meaning this test can be used) for the duration of the  Covid-19 declaration under Section 564(b)(1) of the Act, 21  U.S.C. section 360bbb-3(b)(1), unless the authorization is  terminated or revoked. Performed at [facility], [place] [address], [place], [place] [address]   Blood culture (routine x 2)     Status: None   Collection Time: 10/07/19  9:58 AM   Specimen: BLOOD RIGHT HAND  Result Value Ref Range Status   Specimen Description BLOOD RIGHT HAND  Final   Special Requests   Final    BOTTLES DRAWN  AEROBIC AND ANAEROBIC Blood Culture adequate volume   Culture   Final    NO GROWTH 5 DAYS Performed at [facility], [place] [address]  [place], [place] [address]    Report Status 10/12/2019 FINAL  Final  Urine Culture     Status: None   Collection Time: 10/07/19 10:25 AM   Specimen: Urine, Random  Result Value Ref Range Status   Specimen Description URINE, RANDOM  Final   Special Requests NONE  Final   Culture   Final    NO GROWTH Performed at [facility], [place] [address] [place], [place] [address]    Report Status 10/08/2019 FINAL  Final  Blood culture (routine x 2)     Status: None   Collection Time: 10/07/19 11:03 AM   Specimen: BLOOD  Result Value Ref Range Status   Specimen Description BLOOD SITE NOT SPECIFIED  Final   Special Requests   Final    BOTTLES DRAWN AEROBIC AND ANAEROBIC Blood Culture adequate volume   Culture   Final    NO GROWTH 5 DAYS Performed at [facility], [place] [address], [place], [place] [address]    Report Status 10/12/2019 FINAL  Final  MRSA PCR Screening     Status: None   Collection Time: 10/07/19 12:27 PM   Specimen: Nasal Mucosa; Nasopharyngeal  Result Value Ref Range Status   MRSA by PCR NEGATIVE NEGATIVE Final    Comment:        The GeneXpert MRSA Assay (FDA approved for NASAL specimens only), is one component of a comprehensive MRSA colonization surveillance program. It is not intended to diagnose MRSA infection nor to guide or monitor treatment for MRSA infections. Performed at [facility], [place] [address] [place], [place] [address]       Radiology Studies: No results found.    Scheduled Meds: . aspirin EC  81 mg Oral Daily  . atorvastatin  20 mg Oral q1800  . calcitRIOL  0.5 mcg Oral Q T,Th,Sa-HD  . Chlorhexidine Gluconate Cloth  6 each Topical Q0600  . feeding supplement (NEPRO CARB STEADY)  237 mL Oral TID WC  . heparin  5,000 Units Subcutaneous Q8H  . levothyroxine  175 mcg Oral QAC breakfast   . lidocaine  1 application Urethral Once  . midodrine      . midodrine  10 mg Oral TID WC  . multivitamin  1 tablet Oral QHS  . sevelamer carbonate  2,400 mg Oral TID WC  . sodium chloride flush  10-40 mL Intracatheter Q12H  . ticagrelor  90 mg Oral BID   Continuous Infusions: . sodium chloride 250 mL (10/07/19 1134)  . ciprofloxacin 400 mg (10/12/19 1946)     LOS: 6 days      Time spent: 35 minutes   Dessa Phi, DO [facility] 10/13/2019, 9:49 AM   Available via Epic secure chat 7am-7pm After these hours, please refer to coverage provider listed on [email or web address]

## 2019-10-13 NOTE — Anesthesia Preprocedure Evaluation (Addendum)
Anesthesia Evaluation  Patient identified by MRN, date of birth, ID bandGeneral Assessment Comment:Patient awake  Reviewed: Allergy & Precautions, NPO status , Patient's Chart, lab work & pertinent test results  Airway Mallampati: III  TM Distance: >3 FB Neck ROM: Full    Dental  (+) Edentulous Upper, Edentulous Lower   Pulmonary former smoker,    Pulmonary exam normal breath sounds clear to auscultation       Cardiovascular hypertension, + CAD and + Peripheral Vascular Disease  Normal cardiovascular exam Rhythm:Regular Rate:Normal  ECG: Accelerated Junctional rhythm Left axis deviation  ECHO: 1. Left ventricular ejection fraction, by visual estimation, is 60 to 65%. The left ventricle has normal function. There is moderately increased left ventricular hypertrophy. 2. Left ventricular diastolic parameters are indeterminate. 3. The left ventricle has no regional wall motion abnormalities. 4. Global right ventricle has mildly reduced systolic function.The right ventricular size is normal. No increase in right ventricular wall thickness. 5. Left atrial size was normal. 6. Right atrial size was normal. 7. The mitral valve is normal in structure. Trivial mitral valve regurgitation. 8. The tricuspid valve is normal in structure. 9. The tricuspid valve is normal in structure. Tricuspid valve regurgitation is trivial. 10. The aortic valve is tricuspid. Aortic valve regurgitation is not visualized. Mild to moderate aortic valve sclerosis/calcification without any evidence of aortic stenosis. 11. The pulmonic valve was grossly normal. Pulmonic valve regurgitation is not visualized. 12. Moderately elevated pulmonary artery systolic pressure. 13. No intracardiac source of embolism detected on this transthoracic study. A transesophageal echocardiogram is recommended to exclude cardiac source of embolism if clinically indicated. 14. The atrial  septum is grossly normal.   Neuro/Psych PSYCHIATRIC DISORDERS Depression  Neuromuscular disease CVA, Residual Symptoms    GI/Hepatic   Endo/Other  diabetesHypothyroidism   Renal/GU ESRF and DialysisRenal disease (T, R, Sat)     Musculoskeletal  (+) Arthritis , Gout   Abdominal (+) + obese,   Peds  Hematology  (+) anemia , HLD   Anesthesia Other Findings stroke  Reproductive/Obstetrics                          Anesthesia Physical Anesthesia Plan  ASA: III  Anesthesia Plan: MAC   Post-op Pain Management:    Induction: Intravenous  PONV Risk Score and Plan: 2 and Ondansetron, Dexamethasone and Treatment may vary due to age or medical condition  Airway Management Planned: Natural Airway and Nasal Cannula  Additional Equipment: Arterial line  Intra-op Plan:   Post-operative Plan:   Informed Consent: I have reviewed the patients History and Physical, chart, labs and discussed the procedure including the risks, benefits and alternatives for the proposed anesthesia with the patient or authorized representative who has indicated his/her understanding and acceptance.       Plan Discussed with: CRNA and Surgeon  Anesthesia Plan Comments:       Anesthesia Quick Evaluation

## 2019-10-13 NOTE — Progress Notes (Signed)
Central monitoring called that patient is in 2nd degree HB, 12 lead EKG done, MD notified. No new order given will continue to monitor the patient.

## 2019-10-14 ENCOUNTER — Inpatient Hospital Stay (HOSPITAL_COMMUNITY)
Admit: 2019-10-14 | Discharge: 2019-10-14 | Disposition: A | Discharge status: Home / Self Care | Payer: Medicare Other | Attending: Interventional Radiology | Admitting: Interventional Radiology

## 2019-10-14 ENCOUNTER — Encounter (HOSPITAL_COMMUNITY): Payer: Self-pay

## 2019-10-14 LAB — RENAL FUNCTION PANEL
Albumin: 2.8 g/dL — ABNORMAL LOW (ref 3.5–5.0)
Anion gap: 19 — ABNORMAL HIGH (ref 5–15)
BUN: 40 mg/dL — ABNORMAL HIGH (ref 8–23)
CO2: 25 mmol/L (ref 22–32)
Calcium: 9.2 mg/dL (ref 8.9–10.3)
Chloride: 96 mmol/L — ABNORMAL LOW (ref 98–111)
Creatinine, Ser: 6.25 mg/dL — ABNORMAL HIGH (ref 0.61–1.24)
GFR calc Af Amer: 9 mL/min — ABNORMAL LOW (ref >60)
GFR calc non Af Amer: 8 mL/min — ABNORMAL LOW (ref >60)
Glucose, Bld: 106 mg/dL — ABNORMAL HIGH (ref 70–99)
Phosphorus: 6.2 mg/dL — ABNORMAL HIGH (ref 2.5–4.6)
Potassium: 3.8 mmol/L (ref 3.5–5.1)
Sodium: 140 mmol/L (ref 135–145)

## 2019-10-14 LAB — CBC WITH DIFFERENTIAL/PLATELET
Abs Immature Granulocytes: 0.09 10*3/uL — ABNORMAL HIGH (ref 0.00–0.07)
Basophils Absolute: 0 10*3/uL (ref 0.0–0.1)
Basophils Relative: 0 %
Eosinophils Absolute: 0.4 10*3/uL (ref 0.0–0.5)
Eosinophils Relative: 4 %
HCT: 37.4 % — ABNORMAL LOW (ref 39.0–52.0)
Hemoglobin: 12.1 g/dL — ABNORMAL LOW (ref 13.0–17.0)
Immature Granulocytes: 1 %
Lymphocytes Relative: 13 %
Lymphs Abs: 1.3 10*3/uL (ref 0.7–4.0)
MCH: 32.9 pg (ref 26.0–34.0)
MCHC: 32.4 g/dL (ref 30.0–36.0)
MCV: 101.6 fL — ABNORMAL HIGH (ref 80.0–100.0)
Monocytes Absolute: 1 10*3/uL (ref 0.1–1.0)
Monocytes Relative: 10 %
Neutro Abs: 7.2 10*3/uL (ref 1.7–7.7)
Neutrophils Relative %: 72 %
Platelets: 190 10*3/uL (ref 150–400)
RBC: 3.68 MIL/uL — ABNORMAL LOW (ref 4.22–5.81)
RDW: 13.2 % (ref 11.5–15.5)
WBC: 10 10*3/uL (ref 4.0–10.5)
nRBC: 0 % (ref 0.0–0.2)

## 2019-10-14 LAB — PLATELET INHIBITION P2Y12: Platelet Function  P2Y12: 233 [PRU] (ref 182–335)

## 2019-10-14 MED ORDER — FENTANYL CITRATE (PF) 100 MCG/2ML IJ SOLN
INTRAMUSCULAR | Status: AC
  Filled 2019-10-14: qty 4

## 2019-10-14 NOTE — Progress Notes (Signed)
[facility] Progress Note   Dialysis Orders: TTS - [facility]  4hrs, BFR 400, DFR 800,  EDW 92kg, 2K/ 2.25Ca  Access: LU AVG  Heparin 5000 Calcitriol 0.5 mcg PO qHD     Assessment/Plan: Lt sided weakness w/slurred speech/facial droop multiple bilateral scattered watershed infarcts R > L - Concern for stroke, Rt ICA stenosis ECHO, lipid panel and A1C ordered. Per neuro. Ok for heparin.  On midodrine - will try to avoid drops in BP with HD. Plan was for cerebral arteriogram and intervention of R ICA on 2/3 but due to PYP phenotype on hold pending antiplt choice.    ESRD - HD TTS.Had  HD Thursday  in ICU, appreciate  IR assistance for  temp cath 1/28 and then AVG declot 1/29. Orders said to use AVGG 1/30  but flow sheet paper  looks like temp cath was used and again 2/2 same despite order for AVG.  Will d/w nursing staff next treatment.   Hypotension/volume  - Blood pressure variable on midodrine as OP.  Does not appear volume overloaded.  Bed weights well above outpt EDW.  Neuro rec SBP 120-140 but in light of his BPs in the 100-150s on floor I have written a parameter to keep BP > 100 at HD; will use low temp dialysate to help prevent hypotension. Anemia of CKD - Hgb 11-12s.  No indication for ESA at this time.  Secondary Hyperparathyroidism -  P 6.9 now, resume binder renvela 3 TIDAC - continue VDRA.  Nutrition - Renal diet w/fluid /mutivit - add nepro  DM - per primary Hypothyroidism - on levothyroxine Dysphagia - on regular renal diet UTI/urethritis:  On ciprofloxacin now. Change dose to ESRD appropriate dose.   Dispo: to SNF, pending   Jannifer Hick MD [facility] Pager [phone]    Subjective:  procedure delayed   Objective Vitals:   10/13/19 2058 10/14/19 0611 10/14/19 0918 10/14/19 1208  BP: (!) 124/51 (!) 127/54 (!) 122/59 128/60  Pulse: 72 75 84 79  Resp: 18 18 18 17   Temp: 98.6 F (37 C) 98.4 F (36.9 C) (!) 97.5 F (36.4 C) 97.9 F  (36.6 C)  TempSrc: Oral Oral Oral Oral  SpO2: 100% 98% 100% 100%  Weight:  96.7 kg    Height:       Physical Exam General: NAD breathing easily on room air. Talkative. Somewhat anxious affect Heart: RRR Lungs: no rales, normal WOB Abdomen: soft NT Extremities: no LE edema Dialysis Access:  Left upper AVGG +bruit- dressing in place, HD cath R IJ Skin: scattered brusing   Additional Objective Labs: Basic Metabolic Panel: Recent Labs  Lab 10/12/19 0701 10/13/19 0515 10/14/19 0746  NA 143 140 140  K 4.1 4.1 3.8  CL 103 100 96*  CO2 24 22 25   GLUCOSE 96 119* 106*  BUN 47* 64* 40*  CREATININE 7.52* 9.04* 6.25*  CALCIUM 9.1 9.0 9.2  PHOS 6.8* 6.9* 6.2*   Liver Function Tests: Recent Labs  Lab 10/12/19 0701 10/13/19 0515 10/14/19 0746  ALBUMIN 2.6* 2.8* 2.8*   No results for input(s): LIPASE, AMYLASE in the last 168 hours. CBC: Recent Labs  Lab 10/10/19 1244 10/10/19 1244 10/11/19 0247 10/11/19 0247 10/12/19 0701 10/13/19 0515 10/14/19 0746  WBC 11.9*   < > 13.0*   < > 10.2 13.4* 10.0  NEUTROABS 9.6*   < > 11.2*   < > 7.6 10.6* 7.2  HGB 12.7*   < > 11.8*   < >  11.7* 12.1* 12.1*  HCT 37.3*   < > 36.2*   < > 36.6* 37.8* 37.4*  MCV 98.2  --  100.0  --  101.4* 101.9* 101.6*  PLT 159   < > 157   < > 171 191 190   < > = values in this interval not displayed.   Blood Culture    Component Value Date/Time   SDES BLOOD SITE NOT SPECIFIED 10/07/2019 1103   SPECREQUEST  10/07/2019 1103    BOTTLES DRAWN AEROBIC AND ANAEROBIC Blood Culture adequate volume   CULT  10/07/2019 1103    NO GROWTH 5 DAYS Performed at [facility], [place] [address] [place], [place] [address]    REPTSTATUS 10/12/2019 FINAL 10/07/2019 1103    Cardiac Enzymes: No results for input(s): CKTOTAL, CKMB, CKMBINDEX, TROPONINI in the last 168 hours. CBG: Recent Labs  Lab 10/08/19 0747 10/08/19 1120 10/12/19 1154 10/12/19 2140 10/13/19 0635  GLUCAP 140* 174* 159* 117* 118*   Iron  Studies: No results for input(s): IRON, TIBC, TRANSFERRIN, FERRITIN in the last 72 hours. Lab Results  Component Value Date   INR 1.0 10/07/2019   INR 1.1 01/27/2019   Studies/Results: No results found. Medications:  sodium chloride Stopped (10/14/19 0858)   ciprofloxacin Stopped (10/13/19 1900)    [START ON 10/15/2019] aspirin EC  81 mg Oral Daily   atorvastatin  20 mg Oral q1800   calcitRIOL  0.5 mcg Oral Q T,Th,Sa-HD   Chlorhexidine Gluconate Cloth  6 each Topical Q0600   clotrimazole   Topical BID   feeding supplement (NEPRO CARB STEADY)  237 mL Oral TID WC   levothyroxine  175 mcg Oral QAC breakfast   lidocaine  1 application Urethral Once   midodrine  10 mg Oral TID WC   multivitamin  1 tablet Oral QHS   sevelamer carbonate  2,400 mg Oral TID WC   sodium chloride flush  10-40 mL Intracatheter Q12H   ticagrelor  90 mg Oral BID

## 2019-10-14 NOTE — Plan of Care (Signed)

## 2019-10-14 NOTE — NC FL2 (Signed)
[place] LEVEL OF CARE SCREENING TOOL     IDENTIFICATION  Patient Name: Frank [name] Birthdate: [birth year] Sex: male Admission Date (Current Location): 10/07/2019  [facility] and [place] Number:  Engineering geologist and Address:  [facility] [place] [address], [place], [place] [address]      Provider Number: [id number]  Attending Physician Name and Address:  Dessa Phi, DO  Relative Name and Phone Number:  [name] (daughter) [phone]    Current Level of Care: Hospital Recommended Level of Care: [place] Prior Approval Number:    Date Approved/Denied:   PASRR Number: [id number]  Discharge Plan: SNF    Current Diagnoses: Patient Active Problem List   Diagnosis Date Noted  . Left-sided weakness   . 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 Stenosis of right carotid artery   . Arterial hypotension   . Cerebral embolism with cerebral infarction 10/08/2019  . Slurred speech 10/07/2019  . Epiretinal membrane (ERM) of right eye 06/10/2019  . Elevated liver enzymes 06/10/2019  . Benign prostatic hyperplasia 05/08/2019  . Insomnia 05/08/2019  . Coronary artery disease involving native coronary artery of native heart without angina pectoris 05/08/2019  . Atherosclerosis 05/08/2019  . Hyperphosphatemia 05/08/2019  . Secondary hyperparathyroidism ([place])   . Hyperlipidemia   . Gout   . Anemia of chronic kidney failure   . Near syncope 01/21/2019  . Elevated troponin 01/21/2019  . Osteoarthritis of midfoot, right 08/13/2016  . Foot swelling 07/04/2016  . DM type 2 with diabetic peripheral neuropathy ([place]) 11/28/2015  . Pre-transplant evaluation for kidney transplant 11/28/2015  . HLD (hyperlipidemia) 08/01/2015  . 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 Chronic idiopathic gout of multiple sites 02/10/2015  . Illiterate  11/19/2013  . ESRD (end stage renal disease) ([place]) 05/07/2013  . Essential hypertension 05/07/2013  . Osteoarthritis 05/07/2013  . Diastasis recti 10/29/2012  . 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 ED (erectile dysfunction) 08/28/2012  . Cataracts, bilateral 05/15/2012  . Chronic pain 05/15/2012  . DM (diabetes mellitus) ([place]) 05/15/2012  . Gout of knee 05/15/2012  . Hyperkalemia 05/15/2012  . Hypothyroidism 05/15/2012    Orientation RESPIRATION BLADDER Height & Weight     Self, Place, Time  O2(nasal cannula 3L/min) Incontinent, External catheter Weight: 213 lb 3 oz (96.7 kg) Height:  5\' 7"  (170.2 cm)  BEHAVIORAL SYMPTOMS/MOOD NEUROLOGICAL BOWEL NUTRITION STATUS      Continent Diet(see discharge summary)  AMBULATORY STATUS COMMUNICATION OF NEEDS Skin   Extensive Assist Verbally Other (Comment)(abrasions and ecchymosis on arms, legs, toes, skin tears right arm and left legs)                       Personal Care Assistance Level of Assistance  Bathing, Feeding, Dressing, Total care Bathing Assistance: Limited assistance Feeding assistance: Limited assistance(needs set up) Dressing Assistance: Limited assistance Total Care Assistance: Maximum assistance   Functional Limitations Info  Sight, Hearing, Speech Sight Info: Adequate Hearing Info: Adequate Speech Info: Adequate    SPECIAL CARE FACTORS FREQUENCY  PT (By licensed PT), OT (By licensed OT)     PT Frequency: min 5x weekly OT Frequency: min 5x weekly            Contractures Contractures Info: Not present    Additional Factors Info  Code Status, Allergies Code Status Info: Full Allergies  Info: Ace inhibitors, angiotensin receptor blockers, ivp dye (iodinated diagnostic agents), adhesive (tape)           Current Medications (10/14/2019):  This is the current hospital active medication list Current Facility-Administered Medications  Medication Dose Route  Frequency Provider Last Rate Last Admin  . 0.9 %  sodium chloride infusion  250 mL Intravenous Continuous Chesley Mires, MD   Stopped at 10/14/19 [phone]  . acetaminophen (TYLENOL) tablet 650 mg  650 mg Oral Q4H PRN Chesley Mires, MD   650 mg at 10/13/19 1729   Or  . acetaminophen (TYLENOL) 160 MG/5ML solution 650 mg  650 mg Per Tube Q4H PRN Chesley Mires, MD       Or  . acetaminophen (TYLENOL) suppository 650 mg  650 mg Rectal Q4H PRN Chesley Mires, MD      . Derrill Memo ON 10/15/2019] aspirin EC tablet 81 mg  81 mg Oral Daily Dessa Phi, DO      . atorvastatin (LIPITOR) tablet 20 mg  20 mg Oral q1800 Chesley Mires, MD   20 mg at 10/13/19 1729  . calcitRIOL (ROCALTROL) capsule 0.5 mcg  0.5 mcg Oral Q T,Th,Sa-HD Chesley Mires, MD   0.5 mcg at 10/13/19 0711  . Chlorhexidine Gluconate Cloth 2 % PADS 6 each  6 each Topical Q0600 Chesley Mires, MD   6 each at 10/14/19 [phone]  . ciprofloxacin (CIPRO) IVPB 400 mg  400 mg Intravenous Q24H Louk, Alexandra M, PA-C   Stopped at 10/13/19 1900  . clotrimazole (LOTRIMIN) 1 % cream   Topical BID Lang Snow, FNP   Given at 10/14/19 1303  . feeding supplement (NEPRO CARB STEADY) liquid 237 mL  237 mL Oral TID WC Chesley Mires, MD      . heparin injection 1,000 Units  1,000 Units Dialysis PRN Chesley Mires, MD   1,600 Units at 10/13/19 [phone]  . ketoconazole (NIZORAL) 2 % shampoo 1 application  1 application Topical PRN Dessa Phi, DO      . levothyroxine (SYNTHROID) tablet 175 mcg  175 mcg Oral QAC breakfast Chesley Mires, MD   Stopped at 10/13/19 [phone]  . lidocaine (XYLOCAINE) 2 % jelly 1 application  1 application Urethral Once Chesley Mires, MD      . midodrine (PROAMATINE) tablet 10 mg  10 mg Oral TID WC Chesley Mires, MD   10 mg at 10/14/19 1255  . multivitamin (RENA-VIT) tablet 1 tablet  1 tablet Oral QHS Chesley Mires, MD   1 tablet at 10/13/19 2230  . sevelamer carbonate (RENVELA) tablet 2,400 mg  2,400 mg Oral TID WC Justin Mend, MD   2,400 mg at 10/14/19 1255   . sodium chloride flush (NS) 0.9 % injection 10-40 mL  10-40 mL Intracatheter Q12H Chesley Mires, MD   10 mL at 10/13/19 2231  . sodium chloride flush (NS) 0.9 % injection 10-40 mL  10-40 mL Intracatheter PRN Chesley Mires, MD      . ticagrelor (BRILINTA) tablet 90 mg  90 mg Oral BID Dessa Phi, DO         Discharge Medications: Please see discharge summary for a list of discharge medications.  Relevant Imaging Results:  Relevant Lab Results:   Additional Information SSN: [id number]  Alberteen Sam, LCSW

## 2019-10-14 NOTE — Progress Notes (Signed)
Procedure cancelled.  Report called to [facility] on 3E.

## 2019-10-14 NOTE — TOC Benefit Eligibility Note (Signed)
Transition of Care [facility]) Benefit Eligibility Note    Patient Details  Name: Frank [name] MRN: [id number] Date of Birth: [birth year]   Medication/Dose: 9Brilinta 90mg  BID     Tier: [place])  Prescription Coverage Preferred Pharmacy: Any retail Pharmacy  Spoke with Person/Company/Phone Number:: Joe/ [employer]/ [phone]  Co-Pay: For a 30 day supply Mail Order or Retail 4.00  Prior Approval: No  Deductible: Unmet(445.00)       Orbie Pyo Phone Number: 10/14/2019, 10:00 AM

## 2019-10-14 NOTE — Progress Notes (Signed)
Referring Physician(s): Rosalin Hawking  Supervising Physician: Luanne Bras  Patient Status:  [facility] - In-pt  Chief Complaint: Genital pain  Subjective:  CVA (multiple B scattered WM infarcts, R>L, watershed territories) thought to be secondary to chronic hypotension and proximal right ICA stenosis. Patient awake and alert sitting in bed watching TV. Still complains of genital pain, stable.   Allergies: Ace inhibitors, Angiotensin receptor blockers, Ivp dye [iodinated diagnostic agents], and Adhesive [tape]  Medications: Prior to Admission medications   Medication Sig Start Date End Date Taking? Authorizing Provider  allopurinol (ZYLOPRIM) 100 MG tablet Take 1 tablet (100 mg total) by mouth daily. 06/03/19  Yes McLean-Scocuzza, Nino Glow, MD  aspirin EC 81 MG tablet Take 81 mg daily by mouth.   Yes [provider]  atorvastatin (LIPITOR) 20 MG tablet Take 20 mg by mouth daily at 6 PM.    Yes [provider]  calcitRIOL (ROCALTROL) 0.5 MCG capsule Take 0.5 mcg by mouth See admin instructions. Only on dialysis days: Tuesday, Thursdays and Saturdays.   Yes [provider]  clotrimazole (LOTRIMIN) 1 % cream Apply 1 application topically daily as needed (For yeast, groin rash).   Yes [provider]  docusate sodium (COLACE) 100 MG capsule Take 200 mg by mouth daily as needed.    Yes [provider]  gabapentin (NEURONTIN) 300 MG capsule Take 1 capsule (300 mg total) by mouth daily. At 3 PM 05/08/19  Yes McLean-Scocuzza, Nino Glow, MD  Influenza Vac A&B Surf Ant Adj (FLUAD IM) Inject 1 each into the muscle once. High dose   Yes [provider]  ketoconazole (NIZORAL) 2 % shampoo Apply 1 application topically 2 (two) times a week. Patient taking differently: Apply 1 application topically as needed for irritation.  06/11/19  Yes McLean-Scocuzza, Nino Glow, MD  levothyroxine (SYNTHROID) 150 MCG tablet Take 150 mcg by mouth daily before  breakfast.   Yes [provider]  lidocaine-prilocaine (EMLA) cream Apply 1 application topically See admin instructions. Apply to access site 1 to 2 hours before dialysis. Cover with occlusive dressing. 04/08/19  Yes [provider]  midodrine (PROAMATINE) 10 MG tablet Take 1 tablet (10 mg total) by mouth 3 (three) times a week. Take within 1/2 hour prior to dialysis start 09/16/19  Yes Baldwin [place], PA-C  multivitamin (RENA-VIT) TABS tablet Take 1 tablet daily by mouth.   Yes [provider]  ondansetron (ZOFRAN) 4 MG tablet Take 4 mg by mouth every 8 (eight) hours as needed for nausea or vomiting.  01/19/19  Yes [provider]  PRESCRIPTION MEDICATION Take 1 oz by mouth See admin instructions. Liquid protein: Liquifil on dialisys days, Tuesday, Thursday and Saturday.   Yes [provider]  sevelamer carbonate (RENVELA) 2.4 g PACK Take by mouth 3 (three) times daily with meals. And with any snacks    Yes [provider]  Zinc Oxide (BALMEX EX) Apply 1 application topically daily as needed (to irritated areas of skin).    Yes [provider]     Vital Signs: BP 128/60 (BP Location: Right Arm)   Pulse 79   Temp 97.9 F (36.6 C) (Oral)   Resp 17   Ht 5\' 7"  (1.702 m)   Wt 213 lb 3 oz (96.7 kg)   SpO2 100%   BMI 33.39 kg/m   Physical Exam Vitals and nursing note reviewed.  Constitutional:      General: He is not in acute distress.  Appearance: Normal appearance.  Cardiovascular:     Rate and Rhythm: Normal rate and regular rhythm.     Heart sounds: Normal heart sounds. No murmur.  Pulmonary:     Effort: Pulmonary effort is normal. No respiratory distress.     Breath sounds: Normal breath sounds. No wheezing.  Skin:    General: Skin is warm and dry.  Neurological:     Mental Status: He is alert and oriented to person, place, and time.     Comments: Can spontaneously move all extremities.     Imaging: No results  found.  Labs:  CBC: Recent Labs    10/11/19 0247 10/12/19 0701 10/13/19 0515 10/14/19 0746  WBC 13.0* 10.2 13.4* 10.0  HGB 11.8* 11.7* 12.1* 12.1*  HCT 36.2* 36.6* 37.8* 37.4*  PLT 157 171 191 190    COAGS: Recent Labs    01/27/19 0521 10/07/19 0858  INR 1.1 1.0  APTT  --  30    BMP: Recent Labs    10/11/19 0247 10/12/19 0701 10/13/19 0515 10/14/19 0746  NA 140 143 140 140  K 3.3* 4.1 4.1 3.8  CL 101 103 100 96*  CO2 23 24 22 25   GLUCOSE 170* 96 119* 106*  BUN 35* 47* 64* 40*  CALCIUM 8.9 9.1 9.0 9.2  CREATININE 5.45* 7.52* 9.04* 6.25*  GFRNONAA 9* 6* 5* 8*  GFRAA 11* 7* 6* 9*    LIVER FUNCTION TESTS: Recent Labs    01/22/19 0541 01/23/19 0349 01/28/19 0437 05/11/19 1037 06/10/19 1208 06/10/19 1208 10/07/19 0858 10/08/19 0316 10/11/19 0247 10/12/19 0701 10/13/19 0515 10/14/19 0746  BILITOT 0.5  --   --  0.3 0.4  --  0.4  --   --   --   --   --   AST 48*  --   --  63* 47*  --  40  --   --   --   --   --   ALT 27  --   --  88* 67*  --  24  --   --   --   --   --   ALKPHOS 68  --   --  168* 169*  --  99  --   --   --   --   --   PROT 6.4*  --   --  7.2 8.3  --  7.3  --   --   --   --   --   ALBUMIN 3.0*   < >   < > 4.0 4.3   < > 3.5   < > 2.7* 2.6* 2.8* 2.8*   < > = values in this interval not displayed.    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 Labs from today reviewed by Dr. Estanislado Pandy. Patient has been on Brilinta and Aspirin since 10/10/2019. Patient is not a candidate for other antiplatelets at this time (patient was not inhibited on Plavix, patient is contraindicated for Prasugrel use given history of TIA/CVA). In addition, it is felt that P2Y12 result is probably not accurate given history of ESRD/dialysis. Therefore, plan to proceed with procedure on current DAPT and will administer IV Heparin during procedure-  plan for image-guided cerebral arteriogram with possible revascularization (angiplasty/stent placement) of proximal right ICA stenosis tentatively for tomorrow 10/15/2019 with Dr. Estanislado Pandy. If patient requires further inhibition during procedure, will proceed with  IV Integrelin or IV Aggrastat. The above has been explained to both patient and his daughter, [name] (via telephone). Patient will be NPO at midnight. No additional pre-op lab orders per Dr. Estanislado Pandy. Continue taking Ciprofloxacin 400 mg Q12H to complete 7 day course for UTI. Further plans per TRH/neurology/nephrology- appreciate and agree with management. NIR to follow.  Risks and benefits of cerebral arteriogram with intervention were discussed with the patient including, but not limited to bleeding, infection, vascular injury, contrast induced renal failure, stroke, reperfusion hemorrhage, intra-stent thrombosis, or even death. This interventional procedure involves the use of X-rays and because of the nature of the planned procedure, it is possible that we will have prolonged use of X-ray fluoroscopy. Potential radiation risks to you include (but are not limited to) the following: - A slightly elevated risk for cancer  several years later in life. This risk is typically less than 0.5% percent. This risk is low in comparison to the normal incidence of human cancer, which is 33% for women and 50% for men according to the [employer]. - Radiation induced injury can include skin redness, resembling a rash, tissue breakdown / ulcers and hair loss (which can be temporary or permanent).  The likelihood of either of these occurring depends on the difficulty of the procedure and whether you are sensitive to radiation due to previous procedures, disease, or genetic conditions.  IF your procedure requires a prolonged use of radiation, you will be notified and given written instructions for further action.  It is your responsibility to  monitor the irradiated area for the 2 weeks following the procedure and to notify your physician if you are concerned that you have suffered a radiation induced injury.  All of the patient's questions were answered, patient is agreeable to proceed. Consent signed and in chart.   Electronically Signed: Earley Abide, PA-C 10/14/2019, 3:10 PM   I spent a total of 35 Minutes at the the patient's bedside AND on the patient's hospital floor or unit, greater than 50% of which was counseling/coordinating care for proximal right ICA stenosis.

## 2019-10-14 NOTE — Progress Notes (Addendum)
NIR.  Patient was scheduled for an image-guided cerebral arteriogram with possible revascularization/angioplasty/stent placement of proximal right ICA stenosis tentatively for today with Dr. Estanislado Pandy.  P2Y12 233 PRU this AM. Discussed case with Dr. Estanislado Pandy- since it appears that patient is resistant to both Plavix and Brilinta, recommend postponing procedure at this time, and determine the best antiplatelet for patient be on for procedure (for best inhibition), and re-schedule procedure accordingly. Dr. Estanislado Pandy to determine dosing of antiplatelet medication- additional orders/recommendations to follow.  The above was explained to both patient and patient's daughter, Frank [name] (via telephone). All questions answered and concerns addressed.  Further plans per TRH/neurology/nephrology- appreciate and agree with management. NIR to follow.   Frank Graff [name], PA-C 10/14/2019, 9:14 AM

## 2019-10-14 NOTE — Progress Notes (Signed)
PROGRESS NOTE    Frank [name]  [id number] DOB: June 19, 1941 DOA: 10/07/2019 PCP: McLean-Scocuzza, Nino Glow, MD     Brief Narrative:  Frank [name] is a 79 year old male with past medical history significant for ESRD on hemodialysis, diabetes, hypertension, hyperlipidemia, hypothyroidism, peripheral artery disease, gout, depression who presented to the emergency department on 1/27 with chief complaint of weakness, facial droop, slurred speech with concern for CVA.  Neurology was consulted.  Patient was admitted to ICU due to patient's low blood pressure and was started on phenylephrine.  With improvement off vasopressor, patient was transferred to [facility] on 10/13/2019.  Plan was to undergo cerebral arteriogram with possible right ICA revascularization/stent with IR 2/3 but procedure canceled due to P2Y12 level.   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  Follow-up with [facility] neurology in 4 weeks -Aspirin and Brilinta, Lipitor -Rescheduled for cerebral arteriogram with possible right ICA  revascularization/stent with IR   Chronic hypotension -Now off vasopressor -Continue midodrine  ESRD on HD TTS -Nephrology following for dialysis -Temporary dialysis catheter placed 1/28, AVG declot 1/29   Type 2 diabetes, well controlled -Hemoglobin A1c 6.7  Hypothyroidism -Continue Synthroid  UTI, not POA, not CAUTI related -Cipro    DVT prophylaxis: Subq hep Code Status: Full Family Communication: None at bedside  Disposition Plan: Patient is from home prior to admission. Currently in-hospital treatment needed due to stroke treatment. Barrier(s) to discharge include medical stabilization, IR procedure scheduled and suspect patient will discharge to SNF as recommended per PT/OT.    Consultants:   PCCM admission  Nephrology  Neurology  IR   Antimicrobials:  Anti-infectives (From admission, onward)   Start     Dose/Rate Route Frequency Ordered Stop   10/14/19 0715  ceFAZolin (ANCEF) IVPB 2g/100 mL premix  Status:  Discontinued     2 g 200 mL/hr over 30 Minutes Intravenous 60 min pre-op 10/13/19 1755 10/14/19 1054   10/12/19 1600  ciprofloxacin (CIPRO) IVPB 400 mg     400 mg 200 mL/hr over 60 Minutes Intravenous Every 24 hours 10/12/19 1354 10/19/19 1559       Objective: Vitals:   10/13/19 1140 10/13/19 2058 10/14/19 0611 10/14/19 0918  BP: (!) 114/57 (!) 124/51 (!) 127/54 (!) 122/59  Pulse: 90 72 75 84  Resp: 17 18 18 18   Temp: (!) 97.5 F (36.4 C) 98.6 F (37 C) 98.4 F (36.9 C) (!) 97.5 F (36.4 C)  TempSrc: Oral Oral Oral Oral  SpO2: 100% 100% 98% 100%  Weight:   96.7 kg   Height:        Intake/Output Summary (Last 24 hours) at 10/14/2019 1101 Last data filed  at 10/14/2019 0858 Gross per 24 hour  Intake 1002.25 ml  Output 600 ml  Net 402.25 ml   Filed Weights   10/13/19 0647 10/13/19 0952 10/14/19 0611  Weight: 95.9 kg 94.6 kg 96.7 kg    Examination: General exam: Appears calm and comfortable  Respiratory system: Clear to auscultation.  Respiratory effort normal. Cardiovascular system: S1 & S2 heard, RRR. No pedal edema. Gastrointestinal system: Abdomen is nondistended, soft and nontender. Normal bowel sounds heard. Central nervous system: Alert and oriented. Non focal exam. Speech clear  Extremities: Symmetric in appearance bilaterally  Psychiatry: Judgement and insight appear stable. Mood & affect appropriate.    Data Reviewed: I have personally reviewed following labs and imaging studies  CBC: Recent Labs  Lab 10/10/19 1244 10/11/19 0247 10/12/19 0701 10/13/19 0515 10/14/19 0746  WBC 11.9* 13.0* 10.2 13.4* 10.0  NEUTROABS 9.6* 11.2* 7.6 10.6* 7.2  HGB 12.7* 11.8* 11.7* 12.1* 12.1*  HCT 37.3* 36.2* 36.6* 37.8* 37.4*  MCV 98.2 100.0 101.4* 101.9* 101.6*  PLT 159 157 171 191 [id number]   Basic Metabolic Panel: Recent Labs  Lab 10/10/19 1244 10/11/19 0247 10/12/19 0701 10/13/19 0515 10/14/19 0746  NA 137 140 143 140 140  K 3.3* 3.3* 4.1 4.1 3.8  CL 98 101 103 100 96*  CO2 25 23 24 22 25   GLUCOSE 243* 170* 96 119* 106*  BUN 22 35* 47* 64* 40*  CREATININE 4.05* 5.45* 7.52* 9.04* 6.25*  CALCIUM 8.7* 8.9 9.1 9.0 9.2  MG 1.6*  --   --   --   --   PHOS 3.1 4.5 6.8* 6.9* 6.2*   GFR: Estimated Creatinine Clearance: 11 mL/min (A) (by C-G formula based on SCr of 6.25 mg/dL (H)). Liver Function Tests: Recent Labs  Lab 10/10/19 1244 10/11/19 0247 10/12/19 0701 10/13/19 0515 10/14/19 0746  ALBUMIN 2.7* 2.7* 2.6* 2.8* 2.8*   No results for input(s): LIPASE, AMYLASE in the last 168 hours. No results for input(s): AMMONIA in the last 168 hours. Coagulation Profile: No results for input(s): INR, PROTIME in the last 168 hours. Cardiac Enzymes: No results for input(s): CKTOTAL, CKMB, CKMBINDEX, TROPONINI in the last 168 hours. BNP (last 3 results) No results for input(s): PROBNP in the last 8760 hours. HbA1C: No results for input(s): HGBA1C in the last 72 hours. CBG: Recent Labs  Lab 10/08/19 0747  10/08/19 1120 10/12/19 1154 10/12/19 2140 10/13/19 0635  GLUCAP 140* 174* 159* 117* 118*   Lipid Profile: No results for input(s): CHOL, HDL, LDLCALC, TRIG, CHOLHDL, LDLDIRECT in the last 72 hours. Thyroid Function Tests: No results for input(s): TSH, T4TOTAL, FREET4, T3FREE, THYROIDAB in the last 72 hours. Anemia Panel: No results for input(s): VITAMINB12, FOLATE, FERRITIN, TIBC, IRON, RETICCTPCT in the last 72 hours. Sepsis Labs: Recent Labs  Lab 10/07/19 1249  LATICACIDVEN 1.5    Recent Results (from the past 240 hour(s))  Respiratory Panel by RT PCR (Flu A&B, Covid) - Nasopharyngeal Swab     Status: None   Collection Time: 10/07/19  9:39 AM   Specimen: Nasopharyngeal Swab  Result Value Ref Range Status   SARS Coronavirus 2 by RT PCR NEGATIVE NEGATIVE Final    Comment: (NOTE) SARS-CoV-2 target nucleic acids are NOT DETECTED. The SARS-CoV-2 RNA is generally detectable in upper respiratoy specimens during the acute phase of infection. The lowest concentration of SARS-CoV-2 viral copies this assay can detect is 131 copies/mL. A negative result does not preclude SARS-Cov-2 infection and should not be used as the sole  basis for treatment or other patient management decisions. A negative result may occur with  improper specimen collection/handling, submission of specimen other than nasopharyngeal swab, presence of viral mutation(s) within the areas targeted by this assay, and inadequate number of viral copies (<131 copies/mL). A negative result must be combined with clinical observations, patient history, and epidemiological information. The expected result is Negative. Fact Sheet for Patients:  PinkCheek.be Fact Sheet for Healthcare Providers:  GravelBags.it This test is not yet ap proved or cleared by the [place] FDA and  has been authorized for detection and/or diagnosis of SARS-CoV-2 by FDA under an  Emergency Use Authorization (EUA). This EUA will remain  in effect (meaning this test can be used) for the duration of the COVID-19 declaration under Section 564(b)(1) of the Act, 21 U.S.C. section 360bbb-3(b)(1), unless the authorization is terminated or revoked sooner.    Influenza A by PCR NEGATIVE NEGATIVE Final   Influenza B by PCR NEGATIVE NEGATIVE Final    Comment: (NOTE) The Xpert Xpress SARS-CoV-2/FLU/RSV assay is intended as an aid in  the diagnosis of influenza from Nasopharyngeal swab specimens and  should not be used as a sole basis for treatment. Nasal washings and  aspirates are unacceptable for Xpert Xpress SARS-CoV-2/FLU/RSV  testing. Fact Sheet for Patients: PinkCheek.be Fact Sheet for Healthcare Providers: GravelBags.it This test is not yet approved or cleared by the [place] FDA and  has been authorized for detection and/or diagnosis of SARS-CoV-2 by  FDA under an Emergency Use Authorization (EUA). This EUA will remain  in effect (meaning this test can be used) for the duration of the  Covid-19 declaration under Section 564(b)(1) of the Act, 21  U.S.C. section 360bbb-3(b)(1), unless the authorization is  terminated or revoked. Performed at [facility], [place] [address] [place], [place] [address]   Blood culture (routine x 2)     Status: None   Collection Time: 10/07/19  9:58 AM   Specimen: BLOOD RIGHT HAND  Result Value Ref Range Status   Specimen Description BLOOD RIGHT HAND  Final   Special Requests   Final    BOTTLES DRAWN AEROBIC AND ANAEROBIC Blood Culture adequate volume   Culture   Final    NO GROWTH 5 DAYS Performed at [facility], [place] [address], [place], [place] [address]    Report Status 10/12/2019 FINAL  Final  Urine Culture     Status: None   Collection Time: 10/07/19 10:25 AM   Specimen: Urine, Random  Result Value Ref Range Status   Specimen Description URINE,  RANDOM  Final   Special Requests NONE  Final   Culture   Final    NO GROWTH Performed at [facility], [place] [address], [place], [place] [address]    Report Status 10/08/2019 FINAL  Final  Blood culture (routine x 2)     Status: None   Collection Time: 10/07/19 11:03 AM   Specimen: BLOOD  Result Value Ref Range Status   Specimen Description BLOOD SITE NOT SPECIFIED  Final   Special Requests   Final    BOTTLES DRAWN AEROBIC AND ANAEROBIC Blood Culture adequate volume   Culture   Final    NO GROWTH 5 DAYS Performed at [facility], [place] [address], [place], [place] [address]    Report Status 10/12/2019 FINAL  Final  MRSA PCR Screening     Status: None   Collection Time: 10/07/19 12:27 PM   Specimen: Nasal  Mucosa; Nasopharyngeal  Result Value Ref Range Status   MRSA by PCR NEGATIVE NEGATIVE Final    Comment:        The GeneXpert MRSA Assay (FDA approved for NASAL specimens only), is one component of a comprehensive MRSA colonization surveillance program. It is not intended to diagnose MRSA infection nor to guide or monitor treatment for MRSA infections. Performed at [facility], [place] [address], [place], [place] [address]       Radiology Studies: No results found.    Scheduled Meds: . [START ON 10/15/2019] aspirin EC  81 mg Oral Daily  . atorvastatin  20 mg Oral q1800  . calcitRIOL  0.5 mcg Oral Q T,Th,Sa-HD  . Chlorhexidine Gluconate Cloth  6 each Topical Q0600  . clotrimazole   Topical BID  . feeding supplement (NEPRO CARB STEADY)  237 mL Oral TID WC  . levothyroxine  175 mcg Oral QAC breakfast  . lidocaine  1 application Urethral Once  . midodrine  10 mg Oral TID WC  . multivitamin  1 tablet Oral QHS  . sevelamer carbonate  2,400 mg Oral TID WC  . sodium chloride flush  10-40 mL Intracatheter Q12H  . ticagrelor  90 mg Oral BID   Continuous Infusions: . sodium chloride Stopped (10/14/19 0858)  . ciprofloxacin Stopped (10/13/19 1900)      LOS: 7 days      Time spent 20 minutes   Dessa Phi, DO [facility] 10/14/2019, 11:01 AM   Available via Epic secure chat 7am-7pm After these hours, please refer to coverage provider listed on [email or web address]

## 2019-10-14 NOTE — TOC Initial Note (Signed)
Transition of Care [facility]) - Initial/Assessment Note    Patient Details  Name: Frank [name] MRN: [id number] Date of Birth: [birth year]  Transition of Care [facility]) CM/SW Contact:    Alberteen Sam, [place] Phone Number: [phone] 10/14/2019, 1:57 PM  Clinical Narrative:                  CSW spoke with patient's daughter [name] who is also an Therapist, sports here at [facility] main campus. [name] informed of PT recommendation of SNF, reports being in agreement with this discharge plan as patient continues to need assistance with mobility. [name] identified patient has family support, however unable to provide 24/7 care. [name] agreeable for CSW to fax out SNF referrals for bed offers.   CSW notes patient is Cain Saupe, Sat dialysis patient at [facility] [address]. CSW working with renal navigator Terri Piedra on identifying SNF that can transport to this HD center.  CSW to update [name] on bed offers once available.    Expected Discharge Plan: Skilled Nursing Facility Barriers to Discharge: Continued Medical Work up   Patient Goals and CMS Choice   CMS Medicare.gov Compare Post Acute Care list provided to:: Patient Represenative (must comment)(daughter [name]) Choice offered to / list presented to : Adult Children  Expected Discharge Plan and Services Expected Discharge Plan: [place] Acute Care Choice: [place] Living arrangements for the past 2 months: Single Family Home                                      Prior Living Arrangements/Services Living arrangements for the past 2 months: Single Family Home Lives with:: Self Patient language and need for interpreter reviewed:: Yes Do you feel safe going back to the place where you live?: No   needs short term rehab  Need for Family Participation in Patient Care: Yes (Comment) Care giver support system in place?: Yes (comment)   Criminal Activity/Legal Involvement Pertinent to Current  Situation/Hospitalization: No - Comment as needed  Activities of Daily Living Home Assistive Devices/Equipment: Cane (specify quad or straight) ADL Screening (condition at time of admission) Patient's cognitive ability adequate to safely complete daily activities?: Yes Is the patient deaf or have difficulty hearing?: No Does the patient have difficulty seeing, even when wearing glasses/contacts?: No Does the patient have difficulty concentrating, remembering, or making decisions?: No Patient able to express need for assistance with ADLs?: Yes Does the patient have difficulty dressing or bathing?: No Independently performs ADLs?: Yes (appropriate for developmental age) Does the patient have difficulty walking or climbing stairs?: Yes Weakness of Legs: Both Weakness of Arms/Hands: Both  Permission Sought/Granted Permission sought to share information with : Case Manager, Customer service manager, Family Supports Permission granted to share information with : Yes, Verbal Permission Granted  Share Information with NAME: [name]  Permission granted to share info w AGENCY: SNFs  Permission granted to share info w Relationship: daughter  Permission granted to share info w Contact Information: [phone]  Emotional Assessment Appearance:: Appears stated age Attitude/Demeanor/Rapport: Unable to Assess Affect (typically observed): Unable to Assess   Alcohol / Substance Use: Not Applicable Psych Involvement: No (comment)  Admission diagnosis:  Slurred speech [R47.81] ESRD (end stage renal disease) ([place]) [N18.6] Left-sided weakness [R53.1] Patient Active Problem List   Diagnosis Date Noted  . Left-sided weakness   . Cerebrovascular accident (CVA) due to  embolism of right carotid artery ([place])   . Stenosis of right carotid artery   . Arterial hypotension   . Cerebral embolism with cerebral infarction 10/08/2019  . Slurred speech 10/07/2019  . Epiretinal membrane (ERM) of right eye  06/10/2019  . Elevated liver enzymes 06/10/2019  . Benign prostatic hyperplasia 05/08/2019  . Insomnia 05/08/2019  . Coronary artery disease involving native coronary artery of native heart without angina pectoris 05/08/2019  . Atherosclerosis 05/08/2019  . Hyperphosphatemia 05/08/2019  . Secondary hyperparathyroidism ([place])   . Hyperlipidemia   . Gout   . Anemia of chronic kidney failure   . Near syncope 01/21/2019  . Elevated troponin 01/21/2019  . Osteoarthritis of midfoot, right 08/13/2016  . Foot swelling 07/04/2016  . DM type 2 with diabetic peripheral neuropathy ([place]) 11/28/2015  . Pre-transplant evaluation for kidney transplant 11/28/2015  . HLD (hyperlipidemia) 08/01/2015  . 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 Chronic idiopathic gout of multiple sites 02/10/2015  . Illiterate 11/19/2013  . ESRD (end stage renal disease) ([place]) 05/07/2013  . Essential hypertension 05/07/2013  . Osteoarthritis 05/07/2013  . Diastasis recti 10/29/2012  . 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 ED (erectile dysfunction) 08/28/2012  . Cataracts, bilateral 05/15/2012  . Chronic pain 05/15/2012  . DM (diabetes mellitus) ([place]) 05/15/2012  . Gout of knee 05/15/2012  . Hyperkalemia 05/15/2012  . Hypothyroidism 05/15/2012   PCP:  McLean-Scocuzza, Nino Glow, MD Pharmacy:   [facility] [address] ([address]), [place] [address] [place]) [place] [address] Phone: [phone] Fax: [place], [place] - [address] [place] [address] Phone: [phone] Fax: [phone]     Social Determinants of Health (SDOH) Interventions    Readmission Risk Interventions No flowsheet data found.

## 2019-10-14 NOTE — Progress Notes (Signed)
Physical Therapy Treatment Patient Details Name: Frank [name] MRN: [id number] DOB: Aug 27, 1941 Today's Date: 10/14/2019    History of Present Illness 79 yo admitted with slurred speech and facial droop with multifocal watershed infarcts. PMhx: ESRD, DM, HTN, HLD, neuropathy, PAD, depression, gout    PT Comments    Patient received in bed, very pleasant but extremely tangential and required frequent redirection and cuing to attend to task for participation in session. See below for assist/mobility levels. Seemed to have difficulty processing VC likely due to mix of poor attention and slow processing overall, needed Max VC and hand over hand assist for sequencing and to maintain safety during session. Performed multiple transfers first to [facility] where he was able to have small BM, then to bed, and from bed to recliner. Deferred gait today because of moderate nosebleed he developed after toileting, RN aware and providing care at [place]. He was left up in the chair with RN present and attending, chair alarm active.     Follow Up Recommendations  SNF;Supervision for mobility/OOB     Equipment Recommendations  Rolling walker with 5" wheels;3in1 (PT)    Recommendations for Other Services       Precautions / Restrictions Precautions Precautions: Fall Restrictions Weight Bearing Restrictions: No    Mobility  Bed Mobility Overal bed mobility: Needs Assistance Bed Mobility: Supine to Sit     Supine to sit: HOB elevated;Mod assist     General bed mobility comments: ModA to really elevate trunk, once trunk was up able to scoot to EOB with min guard  Transfers Overall transfer level: Needs assistance Equipment used: Rolling walker (2 wheeled) Transfers: Sit to/from Stand Sit to Stand: Max assist         General transfer comment: MaxAx1 to power up to standing from standard height bed; interestingly able to stand from [facility] same height as bed with just MinA however. Hand over hand assist  for correct  hand placement since he had difficulty processing VC  Ambulation/Gait             General Gait Details: deferred- nosebleed   Stairs             Wheelchair Mobility    Modified Rankin (Stroke Patients Only)       Balance Overall balance assessment: Needs assistance;History of Falls Sitting-balance support: No upper extremity supported;Feet supported Sitting balance-Leahy Scale: Good Sitting balance - Comments: pt able to sit  without physical assist   Standing balance support: Bilateral upper extremity supported;During functional activity Standing balance-Leahy Scale: Poor Standing balance comment: bil UE support on RW                            Cognition Arousal/Alertness: Awake/alert Behavior During Therapy: Impulsive;Flat affect(tangential) Overall Cognitive Status: Impaired/Different from baseline Area of Impairment: Attention;Problem solving;Safety/judgement;Following commands                   Current Attention Level: Focused Memory: Decreased short-term memory Following Commands: Follows one step commands with increased time;Follows one step commands inconsistently Safety/Judgement: Decreased awareness of deficits;Decreased awareness of safety   Problem Solving: Slow processing;Requires verbal cues;Requires tactile cues General Comments: extremely tangential requiring frequent cues and repeated hand over hand assist for attendance to task and to maintain safety. No inappropriate comments today but did talk about penis hurting a lot      Exercises      General Comments General comments (skin integrity,  edema, etc.): had nosebleed at EOS- RN notified and was providing care at [place], also aware of pill PT found in the bed      Pertinent Vitals/Pain Pain Assessment: Faces Faces Pain Scale: Hurts even more Pain Location: groin Pain Descriptors / Indicators: Discomfort Pain Intervention(s): Limited activity within  patient's tolerance    Home Living                      Prior Function            PT Goals (current goals can now be found in the care plan section) Acute Rehab PT Goals Patient Stated Goal: return home PT Goal Formulation: With patient Time For Goal Achievement: 10/23/19 Potential to Achieve Goals: Fair Progress towards PT goals: Progressing toward goals    Frequency    Min 3X/week      PT Plan Current plan remains appropriate    Co-evaluation              AM-PAC PT "6 Clicks" Mobility   Outcome Measure  Help needed turning from your back to your side while in a flat bed without using bedrails?: A Little Help needed moving from lying on your back to sitting on the side of a flat bed without using bedrails?: A Lot Help needed moving to and from a bed to a chair (including a wheelchair)?: A Little Help needed standing up from a chair using your arms (e.g., wheelchair or bedside chair)?: A Lot Help needed to walk in hospital room?: A Little Help needed climbing 3-5 steps with a railing? : A Lot 6 Click Score: 15    End of Session Equipment Utilized During Treatment: Gait belt Activity Tolerance: Patient tolerated treatment well Patient left: in chair;with call bell/phone within reach;with chair alarm set;with nursing/sitter in room Nurse Communication: Mobility status;Other (comment)(nosebleed and white pill found in bed) PT Visit Diagnosis: Other abnormalities of gait and mobility (R26.89);Difficulty in walking, not elsewhere classified (R26.2)     Time: [id number] PT Time Calculation (min) (ACUTE ONLY): 33 min  Charges:  $Therapeutic Activity: 23-37 mins                     Windell Norfolk, DPT, PN1   Supplemental Physical Therapist [place]    Pager [phone] Acute Rehab Office [phone]

## 2019-10-15 ENCOUNTER — Inpatient Hospital Stay (HOSPITAL_COMMUNITY): Payer: Medicare Other | Admitting: Critical Care Medicine

## 2019-10-15 ENCOUNTER — Encounter (HOSPITAL_COMMUNITY)
Admission: EM | Disposition: A | Discharge status: Skilled Nursing Facility | Payer: Self-pay | Source: Home / Self Care | Attending: Pulmonary Disease

## 2019-10-15 ENCOUNTER — Encounter (HOSPITAL_COMMUNITY): Payer: Self-pay | Admitting: Pulmonary Disease

## 2019-10-15 ENCOUNTER — Inpatient Hospital Stay (HOSPITAL_COMMUNITY)
Admit: 2019-10-15 | Discharge: 2019-10-15 | Disposition: A | Discharge status: Home / Self Care | Payer: Medicare Other | Attending: Interventional Radiology | Admitting: Interventional Radiology

## 2019-10-15 DIAGNOSIS — I6521 Occlusion and stenosis of right carotid artery: Secondary | ICD-10-CM

## 2019-10-15 HISTORY — PX: IR CT HEAD LTD: IMG2386

## 2019-10-15 HISTORY — PX: IR INTRAVSC STENT CERV CAROTID W/EMB-PROT MOD SED INCL ANGIO: IMG2303

## 2019-10-15 HISTORY — PX: IR ANGIO INTRA EXTRACRAN SEL COM CAROTID INNOMINATE UNI L MOD SED: IMG5358

## 2019-10-15 HISTORY — PX: IR ANGIO VERTEBRAL SEL SUBCLAVIAN INNOMINATE UNI R MOD SED: IMG5365

## 2019-10-15 HISTORY — PX: RADIOLOGY WITH ANESTHESIA: SHX6223

## 2019-10-15 LAB — BASIC METABOLIC PANEL
Anion gap: 11 (ref 5–15)
BUN: 55 mg/dL — ABNORMAL HIGH (ref 8–23)
CO2: 26 mmol/L (ref 22–32)
Calcium: 8.5 mg/dL — ABNORMAL LOW (ref 8.9–10.3)
Chloride: 100 mmol/L (ref 98–111)
Creatinine, Ser: 8.46 mg/dL — ABNORMAL HIGH (ref 0.61–1.24)
GFR calc Af Amer: 6 mL/min — ABNORMAL LOW (ref >60)
GFR calc non Af Amer: 5 mL/min — ABNORMAL LOW (ref >60)
Glucose, Bld: 105 mg/dL — ABNORMAL HIGH (ref 70–99)
Potassium: 3.9 mmol/L (ref 3.5–5.1)
Sodium: 137 mmol/L (ref 135–145)

## 2019-10-15 LAB — SURGICAL PCR SCREEN
MRSA, PCR: NEGATIVE
Staphylococcus aureus: NEGATIVE

## 2019-10-15 LAB — GLUCOSE, CAPILLARY
Glucose-Capillary: 117 mg/dL — ABNORMAL HIGH (ref 70–99)
Glucose-Capillary: 108 mg/dL — ABNORMAL HIGH (ref 70–99)

## 2019-10-15 LAB — POCT ACTIVATED CLOTTING TIME
Activated Clotting Time: 169 seconds
Activated Clotting Time: 191 seconds
Activated Clotting Time: 202 seconds

## 2019-10-15 LAB — HEPARIN LEVEL (UNFRACTIONATED): Heparin Unfractionated: 0.36 IU/mL (ref 0.30–0.70)

## 2019-10-15 SURGERY — IR WITH ANESTHESIA
Anesthesia: Monitor Anesthesia Care

## 2019-10-15 MED ORDER — SODIUM CHLORIDE 0.9 % IV SOLN: INTRAVENOUS | Status: DC

## 2019-10-15 MED ORDER — FENTANYL CITRATE (PF) 100 MCG/2ML IJ SOLN: 25.0000 ug | INTRAMUSCULAR | Status: DC | PRN

## 2019-10-15 MED ORDER — DEXAMETHASONE SODIUM PHOSPHATE 10 MG/ML IJ SOLN
INTRAMUSCULAR | Status: DC | PRN
  Administered 2019-10-15: 4 mg via INTRAVENOUS

## 2019-10-15 MED ORDER — GLYCOPYRROLATE PF 0.2 MG/ML IJ SOSY
PREFILLED_SYRINGE | INTRAMUSCULAR | Status: DC | PRN
  Administered 2019-10-15: .4 mg via INTRAVENOUS
  Administered 2019-10-15: .2 mg via INTRAVENOUS

## 2019-10-15 MED ORDER — CEFAZOLIN SODIUM-DEXTROSE 2-4 GM/100ML-% IV SOLN
INTRAVENOUS | Status: AC
  Filled 2019-10-15: qty 100

## 2019-10-15 MED ORDER — FENTANYL CITRATE (PF) 100 MCG/2ML IJ SOLN
25.0000 ug | INTRAMUSCULAR | Status: DC | PRN
  Administered 2019-10-15: 25 ug via INTRAVENOUS

## 2019-10-15 MED ORDER — PHENYLEPHRINE HCL-NACL 10-0.9 MG/250ML-% IV SOLN
INTRAVENOUS | Status: DC | PRN
  Administered 2019-10-15: 25 ug/min via INTRAVENOUS

## 2019-10-15 MED ORDER — TICAGRELOR 90 MG PO TABS
90.0000 mg | ORAL_TABLET | Freq: Two times a day (BID) | ORAL | Status: DC
  Administered 2019-10-15 – 2019-10-24 (×18): 90 mg via ORAL
  Filled 2019-10-15 (×18): qty 1

## 2019-10-15 MED ORDER — CLEVIDIPINE BUTYRATE 0.5 MG/ML IV EMUL: 0.0000 mg/h | INTRAVENOUS | Status: AC

## 2019-10-15 MED ORDER — PHENYLEPHRINE 40 MCG/ML (10ML) SYRINGE FOR IV PUSH (FOR BLOOD PRESSURE SUPPORT)
PREFILLED_SYRINGE | INTRAVENOUS | Status: DC | PRN
  Administered 2019-10-15 (×2): 80 ug via INTRAVENOUS

## 2019-10-15 MED ORDER — PHENYLEPHRINE HCL-NACL 10-0.9 MG/250ML-% IV SOLN
0.0000 ug/min | INTRAVENOUS | Status: DC
  Administered 2019-10-15: 17:00:00 18 ug/min via INTRAVENOUS
  Administered 2019-10-15: 15 ug/min via INTRAVENOUS
  Administered 2019-10-16: 20 ug/min via INTRAVENOUS
  Filled 2019-10-15 (×2): qty 250

## 2019-10-15 MED ORDER — NEOSTIGMINE METHYLSULFATE 3 MG/3ML IV SOSY
PREFILLED_SYRINGE | INTRAVENOUS | Status: DC | PRN
  Administered 2019-10-15: 3 mg via INTRAVENOUS

## 2019-10-15 MED ORDER — ACETAMINOPHEN 650 MG RE SUPP: 650.0000 mg | RECTAL | Status: DC | PRN

## 2019-10-15 MED ORDER — CHLORHEXIDINE GLUCONATE CLOTH 2 % EX PADS
6.0000 | MEDICATED_PAD | Freq: Every day | CUTANEOUS | Status: DC
  Administered 2019-10-17: 6 via TOPICAL

## 2019-10-15 MED ORDER — IOHEXOL 300 MG/ML  SOLN
150.0000 mL | Freq: Once | INTRAMUSCULAR | Status: AC | PRN
  Administered 2019-10-15: 75 mL via INTRA_ARTERIAL

## 2019-10-15 MED ORDER — IOHEXOL 300 MG/ML  SOLN
50.0000 mL | Freq: Once | INTRAMUSCULAR | Status: AC | PRN
  Administered 2019-10-15: 25 mL via INTRA_ARTERIAL

## 2019-10-15 MED ORDER — ASPIRIN 81 MG PO CHEW: 81.0000 mg | CHEWABLE_TABLET | Freq: Every day | ORAL | Status: DC

## 2019-10-15 MED ORDER — HEPARIN (PORCINE) 25000 UT/250ML-% IV SOLN: 500.0000 [IU]/h | INTRAVENOUS | Status: DC

## 2019-10-15 MED ORDER — FENTANYL CITRATE (PF) 100 MCG/2ML IJ SOLN
INTRAMUSCULAR | Status: AC
  Filled 2019-10-15: qty 2

## 2019-10-15 MED ORDER — TICAGRELOR 90 MG PO TABS: 90.0000 mg | ORAL_TABLET | Freq: Two times a day (BID) | ORAL | Status: DC

## 2019-10-15 MED ORDER — ROCURONIUM BROMIDE 10 MG/ML (PF) SYRINGE
PREFILLED_SYRINGE | INTRAVENOUS | Status: DC | PRN
  Administered 2019-10-15: 40 mg via INTRAVENOUS

## 2019-10-15 MED ORDER — ONDANSETRON HCL 4 MG/2ML IJ SOLN: 4.0000 mg | Freq: Once | INTRAMUSCULAR | Status: DC | PRN

## 2019-10-15 MED ORDER — ACETAMINOPHEN 325 MG PO TABS: 650.0000 mg | ORAL_TABLET | ORAL | Status: DC | PRN

## 2019-10-15 MED ORDER — LIDOCAINE 2% (20 MG/ML) 5 ML SYRINGE
INTRAMUSCULAR | Status: DC | PRN
  Administered 2019-10-15: 60 mg via INTRAVENOUS

## 2019-10-15 MED ORDER — HEPARIN (PORCINE) 25000 UT/250ML-% IV SOLN: 600.0000 [IU]/h | INTRAVENOUS | Status: DC

## 2019-10-15 MED ORDER — HEPARIN SODIUM (PORCINE) 1000 UNIT/ML IJ SOLN
INTRAMUSCULAR | Status: DC | PRN
  Administered 2019-10-15: 1000 [IU] via INTRAVENOUS
  Administered 2019-10-15: 500 [IU] via INTRAVENOUS
  Administered 2019-10-15: 3000 [IU] via INTRAVENOUS

## 2019-10-15 MED ORDER — ONDANSETRON HCL 4 MG/2ML IJ SOLN
INTRAMUSCULAR | Status: DC | PRN
  Administered 2019-10-15: 4 mg via INTRAVENOUS

## 2019-10-15 MED ORDER — CIPROFLOXACIN HCL 500 MG PO TABS
500.0000 mg | ORAL_TABLET | Freq: Every day | ORAL | Status: AC
  Administered 2019-10-15 – 2019-10-18 (×4): 500 mg via ORAL
  Filled 2019-10-15 (×4): qty 1

## 2019-10-15 MED ORDER — HEPARIN (PORCINE) 25000 UT/250ML-% IV SOLN
INTRAVENOUS | Status: AC
  Administered 2019-10-15: 500 [IU]/h via INTRAVENOUS
  Filled 2019-10-15: qty 250

## 2019-10-15 MED ORDER — PROPOFOL 10 MG/ML IV BOLUS
INTRAVENOUS | Status: DC | PRN
  Administered 2019-10-15: 50 mg via INTRAVENOUS
  Administered 2019-10-15: 100 mg via INTRAVENOUS

## 2019-10-15 MED ORDER — ACETAMINOPHEN 160 MG/5ML PO SOLN: 650.0000 mg | ORAL | Status: DC | PRN

## 2019-10-15 MED ORDER — CEFAZOLIN SODIUM-DEXTROSE 2-3 GM-%(50ML) IV SOLR
INTRAVENOUS | Status: DC | PRN
  Administered 2019-10-15: 2 g via INTRAVENOUS

## 2019-10-15 MED ORDER — HEPARIN (PORCINE) 25000 UT/250ML-% IV SOLN: 750.0000 [IU]/h | INTRAVENOUS | Status: DC

## 2019-10-15 MED ORDER — EPTIFIBATIDE 20 MG/10ML IV SOLN
INTRAVENOUS | Status: AC
  Filled 2019-10-15: qty 10

## 2019-10-15 MED ORDER — SUCCINYLCHOLINE CHLORIDE 20 MG/ML IJ SOLN
INTRAMUSCULAR | Status: DC | PRN
  Administered 2019-10-15: 80 mg via INTRAVENOUS

## 2019-10-15 MED ORDER — NITROGLYCERIN 1 MG/10 ML FOR IR/CATH LAB
INTRA_ARTERIAL | Status: AC
  Filled 2019-10-15: qty 10

## 2019-10-15 MED ORDER — EPTIFIBATIDE 20 MG/10ML IV SOLN
INTRAVENOUS | Status: AC | PRN
  Administered 2019-10-15 (×7): 1.5 mg

## 2019-10-15 MED ORDER — PHENYLEPHRINE HCL-NACL 10-0.9 MG/250ML-% IV SOLN: INTRAVENOUS | Status: DC | PRN

## 2019-10-15 NOTE — Anesthesia Procedure Notes (Signed)
Arterial Line Insertion Start/End2/12/2019 8:15 AM, 10/15/2019 8:19 AM Performed by: CRNA  Patient location: Pre-op. Preanesthetic checklist: patient identified, IV checked, site marked, risks and benefits discussed, surgical consent, monitors and equipment checked, pre-op evaluation, timeout performed and anesthesia consent Lidocaine 1% used for infiltration Right, radial was placed Catheter size: 20 G Hand hygiene performed , maximum sterile barriers used  and Seldinger technique used Allen's test indicative of satisfactory collateral circulation Attempts: 1 Procedure performed without using ultrasound guided technique. Following insertion, dressing applied and Biopatch. Post procedure assessment: normal  Patient tolerated the procedure well with no immediate complications.

## 2019-10-15 NOTE — Progress Notes (Signed)
Patient ID: Frank [name], male   DOB: 1941-05-28, 79 y.o.   MRN: [id number] INR. Patient seen and examined briefly. Denies new neuro symptoms of speech,motor or visual difficulties. Fully alert ,oreinted to place and year. Speech clear. Motor grossly equal bilaterally. Patient expressed understanding of the treatment plan including risks of ischemic stroke ,and remote possibility of intrastent thrombosis and reperfusion hemorrhage,death. Consent in the chart. S.Amareon Phung MD

## 2019-10-15 NOTE — Progress Notes (Signed)
ANTICOAGULATION CONSULT NOTE - Initial Consult  Pharmacy Consult for Heparin Indication: post-neuroradiology intervention  Allergies  Allergen Reactions  . Ace Inhibitors Other (See Comments)    Hyperkalemia on ACE INHIBITORS Cr>1.9  . Angiotensin Receptor Blockers Other (See Comments)    Hyperkalemia Elevates Cr > 1.9   . Ivp Dye [Iodinated Diagnostic Agents] Other (See Comments)    Cannot have due to renal failure  . Adhesive [Tape] Other (See Comments)    UNDESCRIBED REACTION Can tolerate ONLY Coban wrap or mild paper tape!!    Patient Measurements: Height: 5\' 7"  (170.2 cm) Weight: 207 lb 10.8 oz (94.2 kg) IBW/kg (Calculated) : 66.1   Vital Signs: Temp: 97 F (36.1 C) (02/04 1232) BP: 102/52 (02/04 1232) Pulse Rate: 63 (02/04 1232)  Labs: Recent Labs    10/13/19 0515 10/14/19 0746 10/15/19 0424  HGB 12.1* 12.1*  --   HCT 37.8* 37.4*  --   PLT 191 190  --   CREATININE 9.04* 6.25* 8.46*    Estimated Creatinine Clearance: 8 mL/min (A) (by C-G formula based on SCr of 8.46 mg/dL (H)).   Medical History: Past Medical History:  Diagnosis Date  . Anemia of chronic kidney failure   . Arthritis   . Bradycardia   . Cataracts, bilateral 05/15/2012  . Chronic idiopathic gout of multiple sites 02/10/2015  . Chronic kidney disease    [facility]  . Depression   . Diabetes mellitus without complication (HCC)    diet controlled   . Diabetic nephropathy ([place])   . ED (erectile dysfunction) 08/28/2012  . Elevated liver enzymes 06/10/2019  . Epiretinal membrane (ERM) of right eye 06/10/2019   Right eye saw AE 06/05/2019    . Essential hypertension 05/07/2013   Last Assessment & Plan:  Formatting of this note might be different from the original. HTN PLAN   BP goal is <140/90 BP Readings from Last 3 Encounters:  08/27/16 140/79  08/13/16 141/80  08/06/16 144/86   Current Anti-Hyptensive Medications: None  Today's Recommendations: Continue lifestyle modifications. Defer  starting medication to PCP.  Recommend continued regular exercise as tolerated. Recomm  . History of UTI    s/p foley in hospital 01/2019   . Hyperlipidemia   . Hypothyroidism   . Illiterate 11/19/2013   Last Assessment & Plan:  Assisted patient with Handicapped Placard application today.    . Insomnia 05/08/2019  . Neuropathy   . Peripheral neuropathy   . Peripheral vascular disease ([place])   . Secondary hyperparathyroidism ([place])    Renal  . Thyroid disease     Assessment: 79 yo male S/P bilateral CCA  Angiograms followed by stent assisted angioplasty of RT ICA prox with distal protection. Heparin at 8 units/kg/hr Until 0800 2/5  Goal of Therapy:  Heparin level 0.1-0.25 units/ml Monitor platelets by anticoagulation protocol: Yes   Plan:  Start heparin infusion at 750 units/hr Check anti-Xa level in 6 hours and daily while on heparin  Stop infusion at 0800 2/5  Alanda Slim, PharmD, [place] Pharmacist Please see AMION for all Pharmacists' Contact Phone Numbers 10/15/2019, 1:14 PM

## 2019-10-15 NOTE — Progress Notes (Addendum)
ANTICOAGULATION CONSULT NOTE - [place] for Heparin Indication: post-neuroradiology intervention  Allergies  Allergen Reactions  . Ace Inhibitors Other (See Comments)    Hyperkalemia on ACE INHIBITORS Cr>1.9  . Angiotensin Receptor Blockers Other (See Comments)    Hyperkalemia Elevates Cr > 1.9   . Ivp Dye [Iodinated Diagnostic Agents] Other (See Comments)    Cannot have due to renal failure  . Adhesive [Tape] Other (See Comments)    UNDESCRIBED REACTION Can tolerate ONLY Coban wrap or mild paper tape!!    Patient Measurements: Height: 5\' 7"  (170.2 cm) Weight: 207 lb 10.8 oz (94.2 kg) IBW/kg (Calculated) : 66.1   Vital Signs: Temp: 98 F (36.7 C) (02/04 1400) BP: 123/57 (02/04 1916) Pulse Rate: 53 (02/04 1916)  Labs: Recent Labs    10/13/19 0515 10/14/19 0746 10/15/19 0424 10/15/19 1903  HGB 12.1* 12.1*  --   --   HCT 37.8* 37.4*  --   --   PLT 191 190  --   --   HEPARINUNFRC  --   --   --  0.36  CREATININE 9.04* 6.25* 8.46*  --     Estimated Creatinine Clearance: 8 mL/min (A) (by C-G formula based on SCr of 8.46 mg/dL (H)).   Medical History: Past Medical History:  Diagnosis Date  . Anemia of chronic kidney failure   . Arthritis   . Bradycardia   . Cataracts, bilateral 05/15/2012  . Chronic idiopathic gout of multiple sites 02/10/2015  . Chronic kidney disease    [facility]  . Depression   . Diabetes mellitus without complication (HCC)    diet controlled   . Diabetic nephropathy ([place])   . ED (erectile dysfunction) 08/28/2012  . Elevated liver enzymes 06/10/2019  . Epiretinal membrane (ERM) of right eye 06/10/2019   Right eye saw AE 06/05/2019    . Essential hypertension 05/07/2013   Last Assessment & Plan:  Formatting of this note might be different from the original. HTN PLAN   BP goal is <140/90 BP Readings from Last 3 Encounters:  08/27/16 140/79  08/13/16 141/80  08/06/16 144/86   Current Anti-Hyptensive Medications:  None  Today's Recommendations: Continue lifestyle modifications. Defer starting medication to PCP.  Recommend continued regular exercise as tolerated. Recomm  . History of UTI    s/p foley in hospital 01/2019   . Hyperlipidemia   . Hypothyroidism   . Illiterate 11/19/2013   Last Assessment & Plan:  Assisted patient with Handicapped Placard application today.    . Insomnia 05/08/2019  . Neuropathy   . Peripheral neuropathy   . Peripheral vascular disease ([place])   . Secondary hyperparathyroidism ([place])    Renal  . Thyroid disease     Assessment: 79 yo male S/P bilateral CCA  Angiograms followed by stent assisted angioplasty of RT ICA prox with distal protection. Heparin running at low rate until tomorrow at 0700. Initial heparin level is above goal at 0.36.  Goal of Therapy:  Heparin level 0.1-0.25 units/ml Monitor platelets by anticoagulation protocol: Yes   Plan:  Reduce heparin to 600 units/h Recheck heparin level with am labs Stop heparin 2/5 at 0700   Arrie Senate, PharmD, BCPS Clinical Pharmacist [phone] Please check AMION for all [place] numbers 10/15/2019

## 2019-10-15 NOTE — Progress Notes (Signed)
Dr. Estanislado Pandy said it was OK to go by the pt's cuff pressures, wanting to keep b/t 120 -140.

## 2019-10-15 NOTE — Procedures (Signed)
S/P bilateral CCA  Angiograms followed by stent assisted angio0plasty  Of RT ICA prox with distal protection. RT CFA approach..  Post procedure CT brain neg for ICH or mass effect. Extubated.  Waking up slowly .Speech soft and muffled. Oriented  to place ,year month. ?H/A . Able toidentify 2 fingers and obey sinple commands appropriately. No facial asymmetry. Moves all 4s to command. RT goin soft. Distal pulses Dopplerable. S.Kadden Osterhout MD

## 2019-10-15 NOTE — Transfer of Care (Signed)
Immediate Anesthesia Transfer of Care Note  Patient: Frank [name]  Procedure(s) Performed: stent placement (N/A )  Patient Location: PACU  Anesthesia Type:General  Level of Consciousness: awake, alert  and patient cooperative  Airway & Oxygen Therapy: Patient Spontanous Breathing and Patient connected to nasal cannula oxygen  Post-op Assessment: Report given to RN, Post -op Vital signs reviewed and stable and physical assessment unchanged from preop  Post vital signs: Reviewed and stable  Last Vitals:  Vitals Value Taken Time  BP 109/47 10/15/19 1147  Temp 36.1 C 10/15/19 1147  Pulse 67 10/15/19 1155  Resp 17 10/15/19 1155  SpO2 100 % 10/15/19 1155  Vitals shown include unvalidated device data.  Last Pain:  Vitals:   10/15/19 1147  TempSrc:   PainSc: 0-No pain      Patients Stated Pain Goal: 0 ([id number])  Complications: No apparent anesthesia complications

## 2019-10-15 NOTE — Progress Notes (Signed)
OT Cancellation Note  Patient Details Name: [place] BUGGY MRN: [id number] DOB: 1941-07-27   Cancelled Treatment:     Attempted to see patient when assigned 4E however off the floor for procedure and now on 4N. Will re-attempt 2/5 as schedule allows.   Rosemary Holms 10/15/2019, 1:08 PM

## 2019-10-15 NOTE — Anesthesia Postprocedure Evaluation (Signed)
Anesthesia Post Note  Patient: Frank [name]  Procedure(s) Performed: stent placement (N/A )     Patient location during evaluation: PACU Anesthesia Type: General Level of consciousness: awake Pain management: pain level controlled Vital Signs Assessment: post-procedure vital signs reviewed and stable Respiratory status: spontaneous breathing, nonlabored ventilation, respiratory function stable and patient connected to nasal cannula oxygen Cardiovascular status: blood pressure returned to baseline and stable Postop Assessment: no apparent nausea or vomiting Anesthetic complications: no    Last Vitals:  Vitals:   10/15/19 1916 10/15/19 2000  BP: (!) 123/57   Pulse: (!) 53   Resp: 18   Temp:  36.7 C  SpO2: 100%     Last Pain:  Vitals:   10/15/19 2000  TempSrc: Axillary  PainSc:                  Karyl Kinnier Suad Autrey

## 2019-10-15 NOTE — Anesthesia Procedure Notes (Signed)
Procedure Name: Intubation Date/Time: 10/15/2019 8:57 AM Performed by: Renato Shin, CRNA Pre-anesthesia Checklist: Patient identified, Emergency Drugs available, Suction available and Patient being monitored Patient Re-evaluated:Patient Re-evaluated prior to induction Oxygen Delivery Method: Circle system utilized Preoxygenation: Pre-oxygenation with 100% oxygen Induction Type: IV induction Ventilation: Mask ventilation without difficulty Laryngoscope Size: Miller and 3 Grade View: Grade I Tube type: Oral Tube size: 7.5 mm Number of attempts: 1 Airway Equipment and Method: Stylet and Oral airway Placement Confirmation: ETT inserted through vocal cords under direct vision,  positive ETCO2 and breath sounds checked- equal and bilateral Secured at: 22 cm Tube secured with: Tape Dental Injury: Teeth and Oropharynx as per pre-operative assessment

## 2019-10-15 NOTE — Progress Notes (Addendum)
PROGRESS NOTE    JAGROOP PELLITTERI  [id number] DOB: December 21, 1940 DOA: 10/07/2019 PCP: McLean-Scocuzza, Nino Glow, MD   Brief Narrative:  Frank [name] is a 79 year old male with past medical history significant for ESRD on hemodialysis, diabetes, hypertension, hyperlipidemia, hypothyroidism, peripheral artery disease, gout, depression who presented to the emergency department on 1/27 with chief complaint of weakness, facial droop, slurred speech with concern for CVA.  Neurology was consulted.  Patient was admitted to ICU due to patient's low blood pressure and was started on phenylephrine.  With improvement off vasopressor, patient was transferred to [facility] hospitalist service on 10/13/2019.  Plan was to undergo cerebral arteriogram with possible right ICA revascularization/stent with IR 2/3 but procedure canceled due to P2Y12 level. Today he underwent bilateral CCA  Angiograms followed by stent assisted angioplasty of RT ICA prox with distal protection.  Patient currently extubated and transferred to neuro ICU.  New events last 24 hours / Subjective: Patient seen in the neuro ICU.  He is complaining of some neck pain status post right ICA stent.   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  Follow-up with [facility] neurology in 4 weeks -Aspirin and Brilinta, Lipitor -he underwent he underwent bilateral CCA  Angiograms followed by stent assisted angioplasty of RT ICA prox with distal protection.  Chronic hypotension -Started on low-dose Neo-Synephrine -Continue midodrine -Continue to monitor blood pressure.  ESRD on HD TTS -Nephrology following for dialysis -Temporary dialysis catheter placed 1/28, AVG declot 1/29  -Nephrology following.  Further dialysis per nephrology.  Type 2 diabetes, well controlled -Hemoglobin A1c 6.7  Hypothyroidism -Continue Synthroid  UTI, not POA, not CAUTI related -Cipro - Urine cx NGTD   DVT prophylaxis: heparin Code Status: Full Family Communication: None at bedside  Disposition Plan: Patient is from home prior to admission. Currently in-hospital treatment needed due to stroke treatment. Barrier(s) to discharge include medical stabilization, IR procedure today suspect patient will discharge to SNF as recommended per PT/OT.    Consultants:   PCCM admission  Nephrology  Neurology  IR   Procedures:  bilateral CCA  Angiograms followed by stent assisted angio0plasty of RT ICA prox with distal protection.   Antimicrobials:  Anti-infectives (From admission, onward)   Start     Dose/Rate Route Frequency Ordered Stop   10/15/19 1630  ciprofloxacin (CIPRO) tablet 500 mg     500 mg Oral Daily 10/15/19 1611 10/19/19 1629   10/15/19 0851  ceFAZolin (ANCEF) 2-4 GM/100ML-% IVPB    Note to Pharmacy: Luis Abed   : cabinet override      10/15/19 0851 10/15/19 2059   10/14/19 0715  ceFAZolin (ANCEF) IVPB 2g/100 mL premix  Status:  Discontinued     2 g 200 mL/hr over 30 Minutes Intravenous 60 min pre-op 10/13/19 1755  10/14/19 1054   10/12/19 1600  ciprofloxacin (CIPRO) IVPB 400 mg  Status:  Discontinued     400 mg 200 mL/hr  over 60 Minutes Intravenous Every 24 hours 10/12/19 1354 10/15/19 1611        Objective: Vitals:   10/15/19 1515 10/15/19 1530 10/15/19 1545 10/15/19 1600  BP: (!) 122/53 (!) 111/57 (!) 110/49 (!) 104/46  Pulse: 65 (!) 51 62 67  Resp: 16 14 (!) 21 (!) 21  Temp:      TempSrc:      SpO2: 100% 98% 100% 98%  Weight:      Height:        Intake/Output Summary (Last 24 hours) at 10/15/2019 1630 Last data filed at 10/15/2019 1600 Gross per 24 hour  Intake 739.48 ml  Output 550 ml  Net 189.48 ml   Filed Weights   10/15/19 0045 10/15/19 0600 10/15/19 0803  Weight: 94.2 kg 94.2 kg 94.2 kg    Examination:  General exam: Appears calm and comfortable  Respiratory system: Clear to auscultation. Respiratory effort normal. Cardiovascular system: S1 & S2 heard, RRR. No pedal edema. Gastrointestinal system: Abdomen is nondistended, soft and nontender. Normal bowel sounds heard. Central nervous system: Alert and oriented.  Left-sided weakness.  Slurred speech improving. Extremities: Symmetric. Skin: No rashes, lesions or ulcers Psychiatry: Judgement and insight appear normal. Mood & affect appropriate.     Data Reviewed: I have personally reviewed following labs and imaging studies  CBC: Recent Labs  Lab 10/10/19 1244 10/11/19 0247 10/12/19 0701 10/13/19 0515 10/14/19 0746  WBC 11.9* 13.0* 10.2 13.4* 10.0  NEUTROABS 9.6* 11.2* 7.6 10.6* 7.2  HGB 12.7* 11.8* 11.7* 12.1* 12.1*  HCT 37.3* 36.2* 36.6* 37.8* 37.4*  MCV 98.2 100.0 101.4* 101.9* 101.6*  PLT 159 157 171 191 [id number]   Basic Metabolic Panel: Recent Labs  Lab 10/10/19 1244 10/10/19 1244 10/11/19 0247 10/12/19 0701 10/13/19 0515 10/14/19 0746 10/15/19 0424  NA 137   < > 140 143 140 140 137  K 3.3*   < > 3.3* 4.1 4.1 3.8 3.9  CL 98   < > 101 103 100 96* 100  CO2 25   < > 23 24 22 25 26   GLUCOSE 243*   < > 170* 96 119* 106* 105*  BUN 22   < > 35* 47* 64* 40* 55*  CREATININE 4.05*   < > 5.45* 7.52* 9.04* 6.25* 8.46*    CALCIUM 8.7*   < > 8.9 9.1 9.0 9.2 8.5*  MG 1.6*  --   --   --   --   --   --   PHOS 3.1  --  4.5 6.8* 6.9* 6.2*  --    < > = values in this interval not displayed.   GFR: Estimated Creatinine Clearance: 8 mL/min (A) (by C-G formula based on SCr of 8.46 mg/dL (H)). Liver Function Tests: Recent Labs  Lab 10/10/19 1244 10/11/19 0247 10/12/19 0701 10/13/19 0515 10/14/19 0746  ALBUMIN 2.7* 2.7* 2.6* 2.8* 2.8*   No results for input(s): LIPASE, AMYLASE in the last 168 hours. No results for input(s): AMMONIA in the last 168 hours. Coagulation Profile: No results for input(s): INR, PROTIME in the last 168 hours. Cardiac Enzymes: No results for input(s): CKTOTAL, CKMB, CKMBINDEX, TROPONINI in the last 168 hours. BNP (last 3 results) No results for input(s): PROBNP in the last 8760 hours. HbA1C: No results for input(s): HGBA1C in the last 72 hours. CBG: Recent Labs  Lab 10/12/19  1154 10/12/19 2140 10/13/19 0635 10/15/19 0727 10/15/19 1142  GLUCAP 159* 117* 118* 117* 108*   Lipid Profile: No results for input(s): CHOL, HDL, LDLCALC, TRIG, CHOLHDL, LDLDIRECT in the last 72 hours. Thyroid Function Tests: No results for input(s): TSH, T4TOTAL, FREET4, T3FREE, THYROIDAB in the last 72 hours. Anemia Panel: No results for input(s): VITAMINB12, FOLATE, FERRITIN, TIBC, IRON, RETICCTPCT in the last 72 hours. Sepsis Labs: No results for input(s): PROCALCITON, LATICACIDVEN in the last 168 hours.  Recent Results (from the past 240 hour(s))  Respiratory Panel by RT PCR (Flu A&B, Covid) - Nasopharyngeal Swab     Status: None   Collection Time: 10/07/19  9:39 AM   Specimen: Nasopharyngeal Swab  Result Value Ref Range Status   SARS Coronavirus 2 by RT PCR NEGATIVE NEGATIVE Final    Comment: (NOTE) SARS-CoV-2 target nucleic acids are NOT DETECTED. The SARS-CoV-2 RNA is generally detectable in upper respiratoy specimens during the acute phase of infection. The lowest concentration of  SARS-CoV-2 viral copies this assay can detect is 131 copies/mL. A negative result does not preclude SARS-Cov-2 infection and should not be used as the sole basis for treatment or other patient management decisions. A negative result may occur with  improper specimen collection/handling, submission of specimen other than nasopharyngeal swab, presence of viral mutation(s) within the areas targeted by this assay, and inadequate number of viral copies (<131 copies/mL). A negative result must be combined with clinical observations, patient history, and epidemiological information. The expected result is Negative. Fact Sheet for Patients:  PinkCheek.be Fact Sheet for Healthcare Providers:  GravelBags.it This test is not yet ap proved or cleared by the [place] FDA and  has been authorized for detection and/or diagnosis of SARS-CoV-2 by FDA under an Emergency Use Authorization (EUA). This EUA will remain  in effect (meaning this test can be used) for the duration of the COVID-19 declaration under Section 564(b)(1) of the Act, 21 U.S.C. section 360bbb-3(b)(1), unless the authorization is terminated or revoked sooner.    Influenza A by PCR NEGATIVE NEGATIVE Final   Influenza B by PCR NEGATIVE NEGATIVE Final    Comment: (NOTE) The Xpert Xpress SARS-CoV-2/FLU/RSV assay is intended as an aid in  the diagnosis of influenza from Nasopharyngeal swab specimens and  should not be used as a sole basis for treatment. Nasal washings and  aspirates are unacceptable for Xpert Xpress SARS-CoV-2/FLU/RSV  testing. Fact Sheet for Patients: PinkCheek.be Fact Sheet for Healthcare Providers: GravelBags.it This test is not yet approved or cleared by the [place] FDA and  has been authorized for detection and/or diagnosis of SARS-CoV-2 by  FDA under an Emergency Use Authorization (EUA).  This EUA will remain  in effect (meaning this test can be used) for the duration of the  Covid-19 declaration under Section 564(b)(1) of the Act, 21  U.S.C. section 360bbb-3(b)(1), unless the authorization is  terminated or revoked. Performed at  [facility], [place] [address], [place], [place] [address]   Blood culture (routine x 2)     Status: None   Collection Time: 10/07/19  9:58 AM   Specimen: BLOOD RIGHT HAND  Result Value Ref Range Status   Specimen Description BLOOD RIGHT HAND  Final   Special Requests   Final    BOTTLES DRAWN AEROBIC AND ANAEROBIC Blood Culture adequate volume   Culture   Final    NO GROWTH 5 DAYS Performed at [facility], [place] [address], [place], [place] [address]  Report Status 10/12/2019 FINAL  Final  Urine Culture     Status: None   Collection Time: 10/07/19 10:25 AM   Specimen: Urine, Random  Result Value Ref Range Status   Specimen Description URINE, RANDOM  Final   Special Requests NONE  Final   Culture   Final    NO GROWTH Performed at [facility], [address], [place], [place] [address]    Report Status 10/08/2019 FINAL  Final  Blood culture (routine x 2)     Status: None   Collection Time: 10/07/19 11:03 AM   Specimen: BLOOD  Result Value Ref Range Status   Specimen Description BLOOD SITE NOT SPECIFIED  Final   Special Requests   Final    BOTTLES DRAWN AEROBIC AND ANAEROBIC Blood Culture adequate volume   Culture   Final    NO GROWTH 5 DAYS Performed at [facility], [place] [address], [place], [place] [address]    Report Status 10/12/2019 FINAL  Final  MRSA PCR Screening     Status: None   Collection Time: 10/07/19 12:27 PM   Specimen: Nasal Mucosa; Nasopharyngeal  Result Value Ref Range Status   MRSA by PCR NEGATIVE NEGATIVE Final    Comment:        The GeneXpert MRSA Assay (FDA approved for NASAL specimens only), is one component of a comprehensive MRSA colonization surveillance program. It is  not intended to diagnose MRSA infection nor to guide or monitor treatment for MRSA infections. Performed at [facility], [place] [address], [place], [place] [address]   Surgical pcr screen     Status: None   Collection Time: 10/15/19  5:15 AM   Specimen: Nasal Mucosa; Nasal Swab  Result Value Ref Range Status   MRSA, PCR NEGATIVE NEGATIVE Final   Staphylococcus aureus NEGATIVE NEGATIVE Final    Comment: (NOTE) The Xpert SA Assay (FDA approved for NASAL specimens in patients 75 years of age and older), is one component of a comprehensive surveillance program. It is not intended to diagnose infection nor to guide or monitor treatment. Performed at [facility], [place] [address], [place], [place] [address]          Radiology Studies: No results found.      Scheduled Meds: . [START ON 10/16/2019] aspirin  81 mg Per Tube Daily  . aspirin EC  81 mg Oral Daily  . atorvastatin  20 mg Oral q1800  . calcitRIOL  0.5 mcg Oral Q T,Th,Sa-HD  . Chlorhexidine Gluconate Cloth  6 each Topical Q0600  . ciprofloxacin  500 mg Oral Daily  . clotrimazole   Topical BID  . eptifibatide      . feeding supplement (NEPRO CARB STEADY)  237 mL Oral TID WC  . fentaNYL      . levothyroxine  175 mcg Oral QAC breakfast  . lidocaine  1 application Urethral Once  . midodrine  10 mg Oral TID WC  . multivitamin  1 tablet Oral QHS  . nitroGLYCERIN      . sevelamer carbonate  2,400 mg Oral TID WC  . sodium chloride flush  10-40 mL Intracatheter Q12H  . ticagrelor  90 mg Oral BID   Continuous Infusions: . sodium chloride 0 mL/hr at 10/14/19 1819  . sodium chloride 10 mL/hr at 10/15/19 0754  . ceFAZolin    . clevidipine    . heparin 750 Units/hr (10/15/19 1600)  . phenylephrine (NEO-SYNEPHRINE) Adult infusion 15 mcg/min (  10/15/19 1600)     LOS: 8 days    Yaakov Guthrie MD [facility]   To contact the attending provider between 7A-7P or the covering provider during after hours  7P-7A, please log into the web site [email or web address] and access using universal [place] password for that web site. If you do not have the password, please call the hospital operator.  10/15/2019, 4:30 PM

## 2019-10-15 NOTE — Progress Notes (Addendum)
[facility] Progress Note   Dialysis Orders: TTS - [facility]  4hrs, BFR 400, DFR 800,  EDW 92kg, 2K/ 2.25Ca  Access: LU AVG  Heparin 5000 Calcitriol 0.5 mcg PO qHD     Assessment/Plan: 1. 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 Per neuro. Ok for heparin.  On midodrine - s/p R ICA stent 2/4, wil push HD to 2/5 given hypotension and tight BP targetsk, K is ok.   2. ESRD - HD TTS.Had  S/p temp cath 1/28 and then AVG declot 1/29. Try to use AVG next tx, still has temp HD cath.  As above holding HD today, will rec tomorrow. 3. Hypotension/volume  - Blood pressure variable on midodrine as OP.  Does not appear volume overloaded.  Bed weights well above outpt EDW.   4. Anemia of CKD - Hgb 11-12s.  No indication for ESA at this time.  5. Secondary Hyperparathyroidism -  P 6.2 now, resume binder renvela 3 TIDAC - continue VDRA.  6. Nutrition - Renal diet w/fluid /mutivit - add nepro  7. DM - per primary 8. Hypothyroidism - on levothyroxine 9. Dysphagia - on regular renal diet 10. UTI/urethritis:  On ciprofloxacin now.    Rexene Agent  MD [facility]     Subjective:    L ICA stent earlier today  On sig pressors post procedure to keep SBP > 120  K 3.9  Objective Vitals:   10/15/19 1330 10/15/19 1345 10/15/19 1400 10/15/19 1415  BP: (!) 108/45 (!) 107/45 (!) 111/49 (!) 129/53  Pulse: (!) 48 (!) 48 (!) 50 (!) 47  Resp: (!) 21 (!) 22 20 (!) 21  Temp:      TempSrc:      SpO2: 95% 96% 100% 99%  Weight:      Height:       Physical Exam General: NAD breathing easily on room air. Talkative. Somewhat anxious affect Heart: RRR Lungs: no rales, normal WOB Abdomen: soft NT Extremities: no LE edema Dialysis Access:  Left upper AVGG +bruit- dressing in place, HD cath R IJ Skin: scattered brusing   Additional  Objective Labs: Basic Metabolic Panel: Recent Labs  Lab 10/12/19 0701 10/12/19 0701 10/13/19 0515 10/14/19 0746 10/15/19 0424  NA 143   < > 140 140 137  K 4.1   < > 4.1 3.8 3.9  CL 103   < > 100 96* 100  CO2 24   < > 22 25 26   GLUCOSE 96   < > 119* 106* 105*  BUN 47*   < > 64* 40* 55*  CREATININE 7.52*   < > 9.04* 6.25* 8.46*  CALCIUM 9.1   < > 9.0 9.2 8.5*  PHOS 6.8*  --  6.9* 6.2*  --    < > = values in this interval not displayed.   Liver Function Tests: Recent Labs  Lab 10/12/19 0701 10/13/19 0515 10/14/19 0746  ALBUMIN 2.6* 2.8* 2.8*   No results for input(s): LIPASE, AMYLASE in the last 168 hours. CBC: Recent Labs  Lab 10/10/19 1244 10/10/19 1244 10/11/19 0247 10/11/19 0247 10/12/19 0701 10/13/19 0515 10/14/19 0746  WBC 11.9*   < > 13.0*   < > 10.2 13.4* 10.0  NEUTROABS 9.6*   < > 11.2*   < > 7.6 10.6* 7.2  HGB 12.7*   < > 11.8*   < >  11.7* 12.1* 12.1*  HCT 37.3*   < > 36.2*   < > 36.6* 37.8* 37.4*  MCV 98.2  --  100.0  --  101.4* 101.9* 101.6*  PLT 159   < > 157   < > 171 191 190   < > = values in this interval not displayed.   Blood Culture    Component Value Date/Time   SDES BLOOD SITE NOT SPECIFIED 10/07/2019 1103   SPECREQUEST  10/07/2019 1103    BOTTLES DRAWN AEROBIC AND ANAEROBIC Blood Culture adequate volume   CULT  10/07/2019 1103    NO GROWTH 5 DAYS Performed at [facility], [place] [address], [place],  [address]    REPTSTATUS 10/12/2019 FINAL 10/07/2019 1103    Cardiac Enzymes: No results for input(s): CKTOTAL, CKMB, CKMBINDEX, TROPONINI in the last 168 hours. CBG: Recent Labs  Lab 10/12/19 1154 10/12/19 2140 10/13/19 0635 10/15/19 0727 10/15/19 1142  GLUCAP 159* 117* 118* 117* 108*   Iron Studies: No results for input(s): IRON, TIBC, TRANSFERRIN, FERRITIN in the last 72 hours. Lab Results  Component Value Date   INR 1.0 10/07/2019   INR 1.1 01/27/2019   Studies/Results: No results found. Medications: .  sodium chloride 0 mL/hr at 10/14/19 1819  . sodium chloride 10 mL/hr at 10/15/19 0754  . sodium chloride    . ceFAZolin    . ciprofloxacin Stopped (10/14/19 1716)  . clevidipine    . heparin 750 Units/hr (10/15/19 1400)   . [START ON 10/16/2019] aspirin  81 mg Per Tube Daily  . aspirin EC  81 mg Oral Daily  . atorvastatin  20 mg Oral q1800  . calcitRIOL  0.5 mcg Oral Q T,Th,Sa-HD  . Chlorhexidine Gluconate Cloth  6 each Topical Q0600  . clotrimazole   Topical BID  . eptifibatide      . feeding supplement (NEPRO CARB STEADY)  237 mL Oral TID WC  . fentaNYL      . levothyroxine  175 mcg Oral QAC breakfast  . lidocaine  1 application Urethral Once  . midodrine  10 mg Oral TID WC  . multivitamin  1 tablet Oral QHS  . nitroGLYCERIN      . sevelamer carbonate  2,400 mg Oral TID WC  . sodium chloride flush  10-40 mL Intracatheter Q12H  . ticagrelor  90 mg Oral BID

## 2019-10-16 ENCOUNTER — Encounter: Payer: Self-pay | Admitting: *Deleted

## 2019-10-16 DIAGNOSIS — R531 Weakness: Secondary | ICD-10-CM

## 2019-10-16 LAB — BASIC METABOLIC PANEL
Anion gap: 18 — ABNORMAL HIGH (ref 5–15)
BUN: 65 mg/dL — ABNORMAL HIGH (ref 8–23)
CO2: 18 mmol/L — ABNORMAL LOW (ref 22–32)
Calcium: 8.7 mg/dL — ABNORMAL LOW (ref 8.9–10.3)
Chloride: 99 mmol/L (ref 98–111)
Creatinine, Ser: 9.02 mg/dL — ABNORMAL HIGH (ref 0.61–1.24)
GFR calc Af Amer: 6 mL/min — ABNORMAL LOW (ref >60)
GFR calc non Af Amer: 5 mL/min — ABNORMAL LOW (ref >60)
Glucose, Bld: 82 mg/dL (ref 70–99)
Potassium: 4.8 mmol/L (ref 3.5–5.1)
Sodium: 135 mmol/L (ref 135–145)

## 2019-10-16 LAB — CBC WITH DIFFERENTIAL/PLATELET
Abs Immature Granulocytes: 0.18 10*3/uL — ABNORMAL HIGH (ref 0.00–0.07)
Basophils Absolute: 0 10*3/uL (ref 0.0–0.1)
Basophils Relative: 0 %
Eosinophils Absolute: 0 10*3/uL (ref 0.0–0.5)
Eosinophils Relative: 0 %
HCT: 32.1 % — ABNORMAL LOW (ref 39.0–52.0)
Hemoglobin: 10.2 g/dL — ABNORMAL LOW (ref 13.0–17.0)
Immature Granulocytes: 2 %
Lymphocytes Relative: 7 %
Lymphs Abs: 0.8 10*3/uL (ref 0.7–4.0)
MCH: 32.3 pg (ref 26.0–34.0)
MCHC: 31.8 g/dL (ref 30.0–36.0)
MCV: 101.6 fL — ABNORMAL HIGH (ref 80.0–100.0)
Monocytes Absolute: 0.8 10*3/uL (ref 0.1–1.0)
Monocytes Relative: 7 %
Neutro Abs: 9.7 10*3/uL — ABNORMAL HIGH (ref 1.7–7.7)
Neutrophils Relative %: 84 %
Platelets: 255 10*3/uL (ref 150–400)
RBC: 3.16 MIL/uL — ABNORMAL LOW (ref 4.22–5.81)
RDW: 13.4 % (ref 11.5–15.5)
WBC: 11.5 10*3/uL — ABNORMAL HIGH (ref 4.0–10.5)
nRBC: 0 % (ref 0.0–0.2)

## 2019-10-16 LAB — GLUCOSE, CAPILLARY: Glucose-Capillary: 122 mg/dL — ABNORMAL HIGH (ref 70–99)

## 2019-10-16 LAB — HEPARIN LEVEL (UNFRACTIONATED): Heparin Unfractionated: <0.1 IU/mL — ABNORMAL LOW (ref 0.30–0.70)

## 2019-10-16 MED ORDER — TRAZODONE HCL 50 MG PO TABS
50.0000 mg | ORAL_TABLET | Freq: Once | ORAL | Status: AC
  Administered 2019-10-16: 50 mg via ORAL
  Filled 2019-10-16: qty 1

## 2019-10-16 MED ORDER — INSULIN ASPART 100 UNIT/ML ~~LOC~~ SOLN: 0.0000 [IU] | Freq: Every day | SUBCUTANEOUS | Status: DC

## 2019-10-16 MED ORDER — HEPARIN SODIUM (PORCINE) 5000 UNIT/ML IJ SOLN
5000.0000 [IU] | Freq: Three times a day (TID) | INTRAMUSCULAR | Status: DC
  Administered 2019-10-16 – 2019-10-22 (×19): 5000 [IU] via SUBCUTANEOUS
  Filled 2019-10-16 (×19): qty 1

## 2019-10-16 MED ORDER — INSULIN ASPART 100 UNIT/ML ~~LOC~~ SOLN
0.0000 [IU] | Freq: Three times a day (TID) | SUBCUTANEOUS | Status: DC
  Administered 2019-10-18: 07:00:00 2 [IU] via SUBCUTANEOUS
  Administered 2019-10-18 – 2019-10-19 (×2): 3 [IU] via SUBCUTANEOUS
  Administered 2019-10-21 (×2): 2 [IU] via SUBCUTANEOUS
  Administered 2019-10-22 – 2019-10-23 (×3): 3 [IU] via SUBCUTANEOUS
  Administered 2019-10-23: 17:00:00 2 [IU] via SUBCUTANEOUS

## 2019-10-16 NOTE — Progress Notes (Signed)
Referring Physician(s): Rosalin Hawking  Supervising Physician: Luanne Bras  Patient Status:  [facility] - In-pt  Chief Complaint: None  Subjective:  CVA (multiple B scattered WM infarcts, R>L, watershed territories) thought to be secondary to chronic hypotension and proximal right ICA stenosis s/p revascularization of proximal right ICA stenosis using stent assisted angioplasty 10/15/2019 by Dr. Estanislado Pandy. Patient awake and alert sitting in bed with no complaints at this time. Can spontaneously move all extremities. Right groin incision c/d/i.   Allergies: Ace inhibitors, Angiotensin receptor blockers, Ivp dye [iodinated diagnostic agents], and Adhesive [tape]  Medications: Prior to Admission medications   Medication Sig Start Date End Date Taking? Authorizing Provider  allopurinol (ZYLOPRIM) 100 MG tablet Take 1 tablet (100 mg total) by mouth daily. 06/03/19  Yes McLean-Scocuzza, Nino Glow, MD  aspirin EC 81 MG tablet Take 81 mg daily by mouth.   Yes [provider]  atorvastatin (LIPITOR) 20 MG tablet Take 20 mg by mouth daily at 6 PM.    Yes [provider]  calcitRIOL (ROCALTROL) 0.5 MCG capsule Take 0.5 mcg by mouth See admin instructions. Only on dialysis days: Tuesday, Thursdays and Saturdays.   Yes [provider]  clotrimazole (LOTRIMIN) 1 % cream Apply 1 application topically daily as needed (For yeast, groin rash).   Yes [provider]  docusate sodium (COLACE) 100 MG capsule Take 200 mg by mouth daily as needed.    Yes [provider]  gabapentin (NEURONTIN) 300 MG capsule Take 1 capsule (300 mg total) by mouth daily. At 3 PM 05/08/19  Yes McLean-Scocuzza, Nino Glow, MD  Influenza Vac A&B Surf Ant Adj (FLUAD IM) Inject 1 each into the muscle once. High dose   Yes [provider]  ketoconazole (NIZORAL) 2 % shampoo Apply 1 application topically 2 (two) times a week. Patient taking differently: Apply 1 application topically as  needed for irritation.  06/11/19  Yes McLean-Scocuzza, Nino Glow, MD  levothyroxine (SYNTHROID) 150 MCG tablet Take 150 mcg by mouth daily before breakfast.   Yes [provider]  lidocaine-prilocaine (EMLA) cream Apply 1 application topically See admin instructions. Apply to access site 1 to 2 hours before dialysis. Cover with occlusive dressing. 04/08/19  Yes [provider]  midodrine (PROAMATINE) 10 MG tablet Take 1 tablet (10 mg total) by mouth 3 (three) times a week. Take within 1/2 hour prior to dialysis start 09/16/19  Yes Baldwin [place], PA-C  multivitamin (RENA-VIT) TABS tablet Take 1 tablet daily by mouth.   Yes [provider]  ondansetron (ZOFRAN) 4 MG tablet Take 4 mg by mouth every 8 (eight) hours as needed for nausea or vomiting.  01/19/19  Yes [provider]  PRESCRIPTION MEDICATION Take 1 oz by mouth See admin instructions. Liquid protein: Liquifil on dialisys days, Tuesday, Thursday and Saturday.   Yes [provider]  sevelamer carbonate (RENVELA) 2.4 g PACK Take by mouth 3 (three) times daily with meals. And with any snacks    Yes [provider]  Zinc Oxide (BALMEX EX) Apply 1 application topically daily as needed (to irritated areas of skin).    Yes [provider]     Vital Signs: BP 134/63   Pulse 85   Temp 98.5 F (36.9 C) (Oral)   Resp (!) 23   Ht 5\' 7"  (1.702 m)   Wt 207 lb 10.8 oz (94.2 kg)   SpO2 98%   BMI 32.53 kg/m   Physical Exam Vitals and nursing  note reviewed.  Constitutional:      General: He is not in acute distress. Pulmonary:     Effort: Pulmonary effort is normal. No respiratory distress.  Skin:    General: Skin is warm and dry.     Comments: Right groin incision soft without active bleeding or hematoma.  Neurological:     Mental Status: He is alert.     Comments: Alert, awake, and oriented x3. Speech and comprehension intact. PERRL bilaterally. No facial asymmetry. Tongue  midline. Can spontaneously move all extremities. No pronator drift. Distal pulses palpable bilaterally with Doppler (DPs and PTs)     Imaging: No results found.  Labs:  CBC: Recent Labs    10/12/19 0701 10/13/19 0515 10/14/19 0746 10/16/19 0500  WBC 10.2 13.4* 10.0 11.5*  HGB 11.7* 12.1* 12.1* 10.2*  HCT 36.6* 37.8* 37.4* 32.1*  PLT 171 191 190 255    COAGS: Recent Labs    01/27/19 0521 10/07/19 0858  INR 1.1 1.0  APTT  --  30    BMP: Recent Labs    10/13/19 0515 10/14/19 0746 10/15/19 0424 10/16/19 0500  NA 140 140 137 135  K 4.1 3.8 3.9 4.8  CL 100 96* 100 99  CO2 22 25 26  18*  GLUCOSE 119* 106* 105* 82  BUN 64* 40* 55* 65*  CALCIUM 9.0 9.2 8.5* 8.7*  CREATININE 9.04* 6.25* 8.46* 9.02*  GFRNONAA 5* 8* 5* 5*  GFRAA 6* 9* 6* 6*    LIVER FUNCTION TESTS: Recent Labs    01/22/19 0541 01/23/19 0349 01/28/19 0437 05/11/19 1037 06/10/19 1208 06/10/19 1208 10/07/19 0858 10/08/19 0316 10/11/19 0247 10/12/19 0701 10/13/19 0515 10/14/19 0746  BILITOT 0.5  --   --  0.3 0.4  --  0.4  --   --   --   --   --   AST 48*  --   --  63* 47*  --  40  --   --   --   --   --   ALT 27  --   --  88* 67*  --  24  --   --   --   --   --   ALKPHOS 68  --   --  168* 169*  --  99  --   --   --   --   --   PROT 6.4*  --   --  7.2 8.3  --  7.3  --   --   --   --   --   ALBUMIN 3.0*   < >   < > 4.0 4.3   < > 3.5   < > 2.7* 2.6* 2.8* 2.8*   < > = values in this interval not displayed.    Assessment and Plan:  CVA (multiple B scattered WM infarcts, R>L, watershed territories) thought to be secondary to chronic hypotension and proximal right ICA stenosis s/p revascularization of proximal right ICA stenosis using stent assisted angioplasty 10/15/2019 by Dr. Estanislado Pandy. Patient's condition stable- can spontaneously move all extremities. Right groin incision stable, distal pulses palpable bilaterally with Doppler. Continue taking Brilinta 90 mg twice daily and Aspirin 81 mg  once daily. Plan to follow-up with Dr. Estanislado Pandy in clinic 4 weeks after discharge- order placed to facilitate this. Continue taking Ciprofloxacin 400 mg Q12H to complete 7 day course for UTI. Further plans per TRH/neurology/nephrology- appreciate and agree with management. Please call [facility] with questions/concerns.    Electronically Signed: Claris Pong  Elani Delph, PA-C 10/16/2019, 9:33 AM   I spent a total of 25 Minutes at the the patient's bedside AND on the patient's hospital floor or unit, greater than 50% of which was counseling/coordinating care for proximal right ICA stenosis s/p revascularization.

## 2019-10-16 NOTE — Progress Notes (Signed)
PT Cancellation Note  Patient Details Name: DAVIYON LEAMING MRN: [id number] DOB: May 09, 1941   Cancelled Treatment:    Reason Eval/Treat Not Completed: Patient at procedure or test/unavailable; patient in HD, will attempt another day.    Reginia Naas 10/16/2019, 2:18 PM  Magda Kiel, [place] [phone] 10/16/2019

## 2019-10-16 NOTE — Progress Notes (Signed)
[facility] Progress Note   Dialysis Orders: TTS - [facility]  4hrs, BFR 400, DFR 800,  EDW 92kg, 2K/ 2.25Ca  Access: LU AVG  Heparin 5000 Calcitriol 0.5 mcg PO qHD     Assessment/Plan: 1. 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 Per neuro. S/p ICU stent yesterday - brilinta BID. Ok for heparin.   2. ESRD - HD TTS.Had  S/p temp cath 1/28 and then AVG declot 1/29. Doing ok on HD today.  Using AVG - if runs full tx today and AVG exam is normal tomorrow will plan to remove HD catheter.    3. Hypotension/volume  - Blood pressure variable on midodrine as OP.  Does not appear volume overloaded.  Bed weights well above outpt EDW.  UF 1.5L today.  4. Anemia of CKD - Hgb 11-12s.  No indication for ESA at this time.  5. Secondary Hyperparathyroidism -  P 6.2 now, resume binder renvela 3 TIDAC - continue VDRA.  6. Nutrition - Renal diet w/fluid /mutivit - add nepro  7. DM - per primary 8. Hypothyroidism - on levothyroxine 9. Dysphagia - on regular renal diet 10. UTI/urethritis:  On ciprofloxacin now.    Justin Mend  MD [facility]     Subjective:    Improved today - seen on HD; only c/o penile pain.  Using AVG today for HD  Objective Vitals:   10/16/19 1340 10/16/19 1345 10/16/19 1346 10/16/19 1400  BP: 129/68 (!) 150/70 (!) 148/55 133/68  Pulse: 79 72 75 76  Resp: 18     Temp: 97.9 F (36.6 C)     TempSrc: Oral     SpO2: 98%     Weight: 99.7 kg     Height:       Physical Exam General: NAD breathing easily on room air. Talkative. Somewhat anxious affect Heart: RRR Lungs: no rales, normal WOB Abdomen: soft NT Extremities: no LE edema Dialysis Access:  Left upper AVGG +bruit- , HD cath R IJ Skin: scattered brusing   Additional Objective Labs: Basic Metabolic Panel: Recent Labs  Lab 10/12/19 0701  10/12/19 0701 10/13/19 0515 10/13/19 0515 10/14/19 0746 10/15/19 0424 10/16/19 0500  NA 143   < > 140   < > 140 137 135  K 4.1   < > 4.1   < > 3.8 3.9 4.8  CL 103   < > 100   < > 96* 100 99  CO2 24   < > 22   < > 25 26 18*  GLUCOSE 96   < > 119*   < > 106* 105* 82  BUN 47*   < > 64*   < > 40* 55* 65*  CREATININE 7.52*   < > 9.04*   < > 6.25* 8.46* 9.02*  CALCIUM 9.1   < > 9.0   < > 9.2 8.5* 8.7*  PHOS 6.8*  --  6.9*  --  6.2*  --   --    < > = values in this interval not displayed.   Liver Function Tests: Recent Labs  Lab 10/12/19 0701 10/13/19 0515 10/14/19 0746  ALBUMIN 2.6* 2.8* 2.8*   No results for input(s): LIPASE, AMYLASE in the last 168 hours. CBC: Recent Labs  Lab 10/11/19 0247 10/11/19 0247 10/12/19 0701 10/12/19 0701 10/13/19 0515 10/14/19 0746 10/16/19 0500  WBC 13.0*   < >  10.2   < > 13.4* 10.0 11.5*  NEUTROABS 11.2*   < > 7.6   < > 10.6* 7.2 9.7*  HGB 11.8*   < > 11.7*   < > 12.1* 12.1* 10.2*  HCT 36.2*   < > 36.6*   < > 37.8* 37.4* 32.1*  MCV 100.0  --  101.4*  --  101.9* 101.6* 101.6*  PLT 157   < > 171   < > 191 190 255   < > = values in this interval not displayed.   Blood Culture    Component Value Date/Time   SDES BLOOD SITE NOT SPECIFIED 10/07/2019 1103   SPECREQUEST  10/07/2019 1103    BOTTLES DRAWN AEROBIC AND ANAEROBIC Blood Culture adequate volume   CULT  10/07/2019 1103    NO GROWTH 5 DAYS Performed at [facility], [place] [address] [place], [place] [address]    REPTSTATUS 10/12/2019 FINAL 10/07/2019 1103    Cardiac Enzymes: No results for input(s): CKTOTAL, CKMB, CKMBINDEX, TROPONINI in the last 168 hours. CBG: Recent Labs  Lab 10/12/19 1154 10/12/19 2140 10/13/19 0635 10/15/19 0727 10/15/19 1142  GLUCAP 159* 117* 118* 117* 108*   Iron Studies: No results for input(s): IRON, TIBC, TRANSFERRIN, FERRITIN in the last 72 hours. Lab Results  Component Value Date   INR 1.0 10/07/2019   INR 1.1 01/27/2019    Studies/Results: No results found. Medications: . sodium chloride 0 mL/hr at 10/14/19 1819  . sodium chloride 10 mL/hr at 10/15/19 0754  . phenylephrine (NEO-SYNEPHRINE) Adult infusion Stopped (10/16/19 0917)   . aspirin  81 mg Per Tube Daily  . aspirin EC  81 mg Oral Daily  . atorvastatin  20 mg Oral q1800  . calcitRIOL  0.5 mcg Oral Q T,Th,Sa-HD  . Chlorhexidine Gluconate Cloth  6 each Topical Q0600  . ciprofloxacin  500 mg Oral Daily  . clotrimazole   Topical BID  . feeding supplement (NEPRO CARB STEADY)  237 mL Oral TID WC  . heparin injection (subcutaneous)  5,000 Units Subcutaneous Q8H  . levothyroxine  175 mcg Oral QAC breakfast  . lidocaine  1 application Urethral Once  . midodrine  10 mg Oral TID WC  . multivitamin  1 tablet Oral QHS  . sevelamer carbonate  2,400 mg Oral TID WC  . sodium chloride flush  10-40 mL Intracatheter Q12H  . ticagrelor  90 mg Oral BID

## 2019-10-16 NOTE — Progress Notes (Signed)
OT Cancellation Note  Patient Details Name: Frank [name] MRN: [id number] DOB: 10/18/1940   Cancelled Treatment:    Reason Eval/Treat Not Completed: Other (comment) Pt at HD, will follow back as available and appropriate.  Zenovia Jarred, MSOT, OTR/L Acute Rehabilitation Services [facility] Office Number: [phone]  Zenovia Jarred 10/16/2019, 4:23 PM

## 2019-10-16 NOTE — Progress Notes (Signed)
Patient arrived to unit, A&Ox4, no pain. Daughter at bedside.

## 2019-10-16 NOTE — Progress Notes (Signed)
Upon assessment pt had removed dressing over AVG. Minimal amount of bleeding. This RN held pressure and re-dressed the site. Pt instructed not to remove any dressings.

## 2019-10-16 NOTE — Progress Notes (Signed)
PROGRESS NOTE    Frank [name]  [id number] DOB: May 05, 1941 DOA: 10/07/2019 PCP: McLean-Scocuzza, Nino Glow, MD   Brief Narrative:  Frank [name] is a 79 year old male with past medical history significant for ESRD on hemodialysis, diabetes, hypertension, hyperlipidemia, hypothyroidism, peripheral artery disease, gout, depression who presented to the emergency department on 1/27 with chief complaint of weakness, facial droop, slurred speech with concern for CVA.  Neurology was consulted.  Patient was admitted to ICU due to patient's low blood pressure and was started on phenylephrine.  With improvement off vasopressor, patient was transferred to [facility] on 10/13/2019.  Plan was to undergo cerebral arteriogram with possible right ICA revascularization/stent with IR 2/3 but procedure canceled due to P2Y12 level. Today he underwent bilateral CCA  Angiograms followed by stent assisted angioplasty of RT ICA prox with distal protection.  Patient currently extubated and transferred to neuro ICU.  New events last 24 hours / Subjective: Patient seen in the neuro ICU. No acute complains, no fever or chills.   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  Follow-up with [facility] neurology in 4 weeks -Aspirin and Brilinta, Lipitor -he underwent he underwent bilateral CCA  Angiograms followed by stent assisted angioplasty of RT ICA prox with distal protection.  Chronic hypotension -Started on low-dose Neo-Synephrine -Continue midodrine -Continue to monitor blood pressure.  ESRD on HD TTS -Nephrology following for dialysis -Temporary dialysis catheter placed 1/28, AVG declot 1/29  -Nephrology following.  Further dialysis per nephrology.  Type 2 diabetes, well controlled -Hemoglobin A1c 6.7  Hypothyroidism -Continue Synthroid  UTI, not POA, not CAUTI related -Cipro - Urine cx NGTD   DVT prophylaxis: heparin Code Status: Full Family Communication: None at bedside  Disposition Plan: Patient is from home prior to admission. Currently in-hospital treatment needed due to stroke treatment. Barrier(s) to discharge include medical stabilization, IR procedure today suspect patient will discharge to SNF as recommended per PT/OT.    Consultants:   PCCM admission  Nephrology  Neurology  IR   Procedures:  bilateral CCA  Angiograms followed by stent assisted angio0plasty of RT ICA prox with distal protection.   Antimicrobials:  Anti-infectives (From admission, onward)   Start     Dose/Rate Route Frequency Ordered Stop   10/15/19 1630  ciprofloxacin (CIPRO) tablet 500 mg     500 mg Oral Daily 10/15/19 1611 10/19/19 1629   10/15/19 0851  ceFAZolin (ANCEF) 2-4 GM/100ML-% IVPB    Note to Pharmacy: Frank [name]   : cabinet override      10/15/19 0851 10/15/19 2059   10/14/19 0715  ceFAZolin (ANCEF) IVPB 2g/100 mL premix  Status:  Discontinued     2 g 200 mL/hr over 30 Minutes Intravenous 60 min pre-op 10/13/19 1755 10/14/19 1054   10/12/19 1600  ciprofloxacin (CIPRO) IVPB 400 mg  Status:  Discontinued     400 mg 200 mL/hr over 60 Minutes Intravenous Every 24 hours  10/12/19 1354 10/15/19 1611        Objective: Vitals:   10/16/19 1600 10/16/19 1630 10/16/19 1645 10/16/19 1902  BP: (!) 152/70 (!) 156/67 (!) 155/63 (!) 123/55  Pulse: 78 72 97 79  Resp:    17  Temp:   (!) 97.5 F (36.4 C) 98.1 F (36.7 C)  TempSrc:   Oral Oral  SpO2:   97% 96%  Weight:      Height:        Intake/Output Summary (Last 24 hours) at 10/16/2019 2026 Last data filed at 10/16/2019 1800 Gross per 24 hour  Intake 409.39 ml  Output 2150 ml  Net -1740.61 ml   Filed Weights   10/15/19 0600 10/15/19 0803 10/16/19 1340  Weight: 94.2 kg 94.2 kg 99.7 kg    Examination:  General exam: Appears calm and comfortable  Respiratory system: Clear to auscultation. Respiratory effort normal. Cardiovascular system: S1 & S2 heard, RRR. No pedal edema. Gastrointestinal system: Abdomen is nondistended, soft and nontender. Normal bowel sounds heard. Central nervous system: Alert and oriented.  Left-sided weakness.  Slurred speech improving. Extremities: Symmetric. Skin: No rashes, lesions or ulcers Psychiatry: Judgement and insight appear normal. Mood & affect appropriate.     Data Reviewed: I have personally reviewed following labs and imaging studies  CBC: Recent Labs  Lab 10/11/19 0247 10/12/19 0701 10/13/19 0515 10/14/19 0746 10/16/19 0500  WBC 13.0* 10.2 13.4* 10.0 11.5*  NEUTROABS 11.2* 7.6 10.6* 7.2 9.7*  HGB 11.8* 11.7* 12.1* 12.1* 10.2*  HCT 36.2* 36.6* 37.8* 37.4* 32.1*  MCV 100.0 101.4* 101.9* 101.6* 101.6*  PLT 157 171 191 190 [id number]   Basic Metabolic Panel: Recent Labs  Lab 10/10/19 1244 10/10/19 1244 10/11/19 0247 10/11/19 0247 10/12/19 0701 10/13/19 0515 10/14/19 0746 10/15/19 0424 10/16/19 0500  NA 137   < > 140   < > 143 140 140 137 135  K 3.3*   < > 3.3*   < > 4.1 4.1 3.8 3.9 4.8  CL 98   < > 101   < > 103 100 96* 100 99  CO2 25   < > 23   < > 24 22 25 26  18*  GLUCOSE 243*   < > 170*   < > 96 119* 106* 105* 82  BUN 22   < > 35*   < > 47*  64* 40* 55* 65*  CREATININE 4.05*   < > 5.45*   < > 7.52* 9.04* 6.25* 8.46* 9.02*  CALCIUM 8.7*   < > 8.9   < > 9.1 9.0 9.2 8.5* 8.7*  MG 1.6*  --   --   --   --   --   --   --   --   PHOS 3.1  --  4.5  --  6.8* 6.9* 6.2*  --   --    < > = values in this interval not displayed.   GFR: Estimated Creatinine Clearance: 7.7 mL/min (A) (by C-G formula based on SCr of 9.02 mg/dL (H)). Liver Function Tests: Recent Labs  Lab 10/10/19 1244 10/11/19 0247 10/12/19 0701 10/13/19 0515 10/14/19 0746  ALBUMIN 2.7* 2.7* 2.6* 2.8* 2.8*   No results for input(s): LIPASE, AMYLASE in the last 168 hours. No results for input(s): AMMONIA in the last 168 hours. Coagulation Profile: No results for input(s): INR,  PROTIME in the last 168 hours. Cardiac Enzymes: No results for input(s): CKTOTAL, CKMB, CKMBINDEX, TROPONINI in the last 168 hours. BNP (last 3 results) No results for input(s): PROBNP in the last 8760 hours. HbA1C: No results for input(s): HGBA1C in the last 72 hours. CBG: Recent Labs  Lab 10/12/19 1154 10/12/19 2140 10/13/19 0635 10/15/19 0727 10/15/19 1142  GLUCAP 159* 117* 118* 117* 108*   Lipid Profile: No results for input(s): CHOL, HDL, LDLCALC, TRIG, CHOLHDL, LDLDIRECT in the last 72 hours. Thyroid Function Tests: No results for input(s): TSH, T4TOTAL, FREET4, T3FREE, THYROIDAB in the last 72 hours. Anemia Panel: No results for input(s): VITAMINB12, FOLATE, FERRITIN, TIBC, IRON, RETICCTPCT in the last 72 hours. Sepsis Labs: No results for input(s): PROCALCITON, LATICACIDVEN in the last 168 hours.  Recent Results (from the past 240 hour(s))  Respiratory Panel by RT PCR (Flu A&B, Covid) - Nasopharyngeal Swab     Status: None   Collection Time: 10/07/19  9:39 AM   Specimen: Nasopharyngeal Swab  Result Value Ref Range Status   SARS Coronavirus 2 by RT PCR NEGATIVE NEGATIVE Final    Comment: (NOTE) SARS-CoV-2 target nucleic acids are NOT DETECTED. The SARS-CoV-2 RNA is  generally detectable in upper respiratoy specimens during the acute phase of infection. The lowest concentration of SARS-CoV-2 viral copies this assay can detect is 131 copies/mL. A negative result does not preclude SARS-Cov-2 infection and should not be used as the sole basis for treatment or other patient management decisions. A negative result may occur with  improper specimen collection/handling, submission of specimen other than nasopharyngeal swab, presence of viral mutation(s) within the areas targeted by this assay, and inadequate number of viral copies (<131 copies/mL). A negative result must be combined with clinical observations, patient history, and epidemiological information. The expected result is Negative. Fact Sheet for Patients:  PinkCheek.be Fact Sheet for Healthcare Providers:  GravelBags.it This test is not yet ap proved or cleared by the [place] FDA and  has been authorized for detection and/or diagnosis of SARS-CoV-2 by FDA under an Emergency Use Authorization (EUA). This EUA will remain  in effect (meaning this test can be used) for the duration of the COVID-19 declaration under Section 564(b)(1) of the Act, 21 U.S.C. section 360bbb-3(b)(1), unless the authorization is terminated or revoked sooner.    Influenza A by PCR NEGATIVE NEGATIVE Final   Influenza B by PCR NEGATIVE NEGATIVE Final    Comment: (NOTE) The Xpert Xpress SARS-CoV-2/FLU/RSV assay is intended as an aid in  the diagnosis of influenza from Nasopharyngeal swab specimens and  should not be used as a sole basis for treatment. Nasal washings and  aspirates are unacceptable for Xpert Xpress SARS-CoV-2/FLU/RSV  testing. Fact Sheet for Patients: PinkCheek.be Fact Sheet for Healthcare Providers: GravelBags.it This test is not yet approved or cleared by the [place] FDA and    has been authorized for detection and/or diagnosis of SARS-CoV-2 by  FDA under an Emergency Use Authorization (EUA). This EUA will remain  in effect (meaning this test can be used) for the duration of the  Covid-19 declaration under Section 564(b)(1) of the Act, 21  U.S.C. section 360bbb-3(b)(1), unless the authorization is  terminated or revoked. Performed at [facility], [place] [address] [place], [place] [address]   Blood culture (routine x 2)     Status: None   Collection Time: 10/07/19  9:58 AM   Specimen: BLOOD RIGHT HAND  Result Value Ref Range Status   Specimen  Description BLOOD RIGHT HAND  Final   Special Requests   Final    BOTTLES DRAWN AEROBIC AND ANAEROBIC Blood Culture adequate volume   Culture   Final    NO GROWTH 5 DAYS Performed at [facility], [address] [place], [place] [address]    Report Status 10/12/2019 FINAL  Final  Urine Culture     Status: None   Collection Time: 10/07/19 10:25 AM   Specimen: Urine, Random  Result Value Ref Range Status   Specimen Description URINE, RANDOM  Final   Special Requests NONE  Final   Culture   Final    NO GROWTH Performed at [facility], [place] [address] [place], [place] [address]    Report Status 10/08/2019 FINAL  Final  Blood culture (routine x 2)     Status: None   Collection Time: 10/07/19 11:03 AM   Specimen: BLOOD  Result Value Ref Range Status   Specimen Description BLOOD SITE NOT SPECIFIED  Final   Special Requests   Final    BOTTLES DRAWN AEROBIC AND ANAEROBIC Blood Culture adequate volume   Culture   Final    NO GROWTH 5 DAYS Performed at [facility], [place] [address], [place], [place] [address]    Report Status 10/12/2019 FINAL  Final  MRSA PCR Screening     Status: None   Collection Time: 10/07/19 12:27 PM   Specimen: Nasal Mucosa; Nasopharyngeal  Result Value Ref Range Status   MRSA by PCR NEGATIVE NEGATIVE Final    Comment:        The GeneXpert MRSA Assay (FDA approved  for NASAL specimens only), is one component of a comprehensive MRSA colonization surveillance program. It is not intended to diagnose MRSA infection nor to guide or monitor treatment for MRSA infections. Performed at [facility], [place] [address] [place], [place] [address]   Surgical pcr screen     Status: None   Collection Time: 10/15/19  5:15 AM   Specimen: Nasal Mucosa; Nasal Swab  Result Value Ref Range Status   MRSA, PCR NEGATIVE NEGATIVE Final   Staphylococcus aureus NEGATIVE NEGATIVE Final    Comment: (NOTE) The Xpert SA Assay (FDA approved for NASAL specimens in patients 75 years of age and older), is one component of a comprehensive surveillance program. It is not intended to diagnose infection nor to guide or monitor treatment. Performed at [facility], [place] [address], [place], [place] [address]          Radiology Studies: No results found.      Scheduled Meds: . aspirin  81 mg Per Tube Daily  . aspirin EC  81 mg Oral Daily  . atorvastatin  20 mg Oral q1800  . calcitRIOL  0.5 mcg Oral Q T,Th,Sa-HD  . Chlorhexidine Gluconate Cloth  6 each Topical Q0600  . ciprofloxacin  500 mg Oral Daily  . clotrimazole   Topical BID  . feeding supplement (NEPRO CARB STEADY)  237 mL Oral TID WC  . heparin injection (subcutaneous)  5,000 Units Subcutaneous Q8H  . [START ON 10/17/2019] insulin aspart  0-15 Units Subcutaneous TID WC  . insulin aspart  0-5 Units Subcutaneous QHS  . levothyroxine  175 mcg Oral QAC breakfast  . lidocaine  1 application Urethral Once  . midodrine  10 mg Oral TID WC  . multivitamin  1 tablet Oral QHS  . sevelamer carbonate  2,400 mg Oral TID WC  .  sodium chloride flush  10-40 mL Intracatheter Q12H  . ticagrelor  90 mg Oral BID   Continuous Infusions: . sodium chloride 0 mL/hr at 10/14/19 1819  . sodium chloride 10 mL/hr at 10/15/19 0754  . phenylephrine (NEO-SYNEPHRINE) Adult infusion Stopped (10/16/19 0917)     LOS: 9 days      Berle Mull MD [facility]   To contact the attending provider between 7A-7P or the covering provider during after hours 7P-7A, please log into the web site [email or web address] and access using universal [place] password for that web site. If you do not have the password, please call the hospital operator.  10/16/2019, 8:26 PM

## 2019-10-17 ENCOUNTER — Inpatient Hospital Stay (HOSPITAL_COMMUNITY): Payer: Medicare Other

## 2019-10-17 LAB — CBC WITH DIFFERENTIAL/PLATELET
Abs Immature Granulocytes: 0.09 10*3/uL — ABNORMAL HIGH (ref 0.00–0.07)
Basophils Absolute: 0 10*3/uL (ref 0.0–0.1)
Basophils Relative: 0 %
Eosinophils Absolute: 0.1 10*3/uL (ref 0.0–0.5)
Eosinophils Relative: 2 %
HCT: 33.6 % — ABNORMAL LOW (ref 39.0–52.0)
Hemoglobin: 10.9 g/dL — ABNORMAL LOW (ref 13.0–17.0)
Immature Granulocytes: 1 %
Lymphocytes Relative: 9 %
Lymphs Abs: 0.8 10*3/uL (ref 0.7–4.0)
MCH: 32.3 pg (ref 26.0–34.0)
MCHC: 32.4 g/dL (ref 30.0–36.0)
MCV: 99.7 fL (ref 80.0–100.0)
Monocytes Absolute: 0.7 10*3/uL (ref 0.1–1.0)
Monocytes Relative: 8 %
Neutro Abs: 7.2 10*3/uL (ref 1.7–7.7)
Neutrophils Relative %: 80 %
Platelets: 259 10*3/uL (ref 150–400)
RBC: 3.37 MIL/uL — ABNORMAL LOW (ref 4.22–5.81)
RDW: 13.2 % (ref 11.5–15.5)
WBC: 8.9 10*3/uL (ref 4.0–10.5)
nRBC: 0 % (ref 0.0–0.2)

## 2019-10-17 LAB — RENAL FUNCTION PANEL
Albumin: 3 g/dL — ABNORMAL LOW (ref 3.5–5.0)
Anion gap: 20 — ABNORMAL HIGH (ref 5–15)
BUN: 33 mg/dL — ABNORMAL HIGH (ref 8–23)
CO2: 21 mmol/L — ABNORMAL LOW (ref 22–32)
Calcium: 8.8 mg/dL — ABNORMAL LOW (ref 8.9–10.3)
Chloride: 97 mmol/L — ABNORMAL LOW (ref 98–111)
Creatinine, Ser: 6.11 mg/dL — ABNORMAL HIGH (ref 0.61–1.24)
GFR calc Af Amer: 9 mL/min — ABNORMAL LOW (ref >60)
GFR calc non Af Amer: 8 mL/min — ABNORMAL LOW (ref >60)
Glucose, Bld: 85 mg/dL (ref 70–99)
Phosphorus: 5.7 mg/dL — ABNORMAL HIGH (ref 2.5–4.6)
Potassium: 3.9 mmol/L (ref 3.5–5.1)
Sodium: 138 mmol/L (ref 135–145)

## 2019-10-17 LAB — GLUCOSE, CAPILLARY
Glucose-Capillary: 92 mg/dL (ref 70–99)
Glucose-Capillary: 76 mg/dL (ref 70–99)
Glucose-Capillary: 102 mg/dL — ABNORMAL HIGH (ref 70–99)
Glucose-Capillary: 117 mg/dL — ABNORMAL HIGH (ref 70–99)
Glucose-Capillary: 98 mg/dL (ref 70–99)

## 2019-10-17 LAB — TROPONIN I (HIGH SENSITIVITY)
Troponin I (High Sensitivity): 74 ng/L — ABNORMAL HIGH (ref <18)
Troponin I (High Sensitivity): 77 ng/L — ABNORMAL HIGH (ref <18)

## 2019-10-17 MED ORDER — SODIUM CHLORIDE 0.9 % IV SOLN: 100.0000 mL | INTRAVENOUS | Status: DC | PRN

## 2019-10-17 MED ORDER — LIDOCAINE HCL (PF) 1 % IJ SOLN: 5.0000 mL | INTRAMUSCULAR | Status: DC | PRN

## 2019-10-17 MED ORDER — ALTEPLASE 2 MG IJ SOLR: 2.0000 mg | Freq: Once | INTRAMUSCULAR | Status: DC | PRN

## 2019-10-17 MED ORDER — HEPARIN SODIUM (PORCINE) 1000 UNIT/ML DIALYSIS: 1000.0000 [IU] | INTRAMUSCULAR | Status: DC | PRN

## 2019-10-17 MED ORDER — ZOLPIDEM TARTRATE 5 MG PO TABS: 5.0000 mg | ORAL_TABLET | Freq: Every evening | ORAL | Status: DC | PRN

## 2019-10-17 MED ORDER — LIDOCAINE-PRILOCAINE 2.5-2.5 % EX CREA: 1.0000 "application " | TOPICAL_CREAM | CUTANEOUS | Status: DC | PRN

## 2019-10-17 MED ORDER — HEPARIN SODIUM (PORCINE) 1000 UNIT/ML DIALYSIS: 20.0000 [IU]/kg | INTRAMUSCULAR | Status: DC | PRN

## 2019-10-17 MED ORDER — PENTAFLUOROPROP-TETRAFLUOROETH EX AERO: 1.0000 "application " | INHALATION_SPRAY | CUTANEOUS | Status: DC | PRN

## 2019-10-17 MED ORDER — CHLORHEXIDINE GLUCONATE CLOTH 2 % EX PADS
6.0000 | MEDICATED_PAD | Freq: Every day | CUTANEOUS | Status: DC
  Administered 2019-10-17 – 2019-10-23 (×7): 6 via TOPICAL

## 2019-10-17 MED ORDER — OXYBUTYNIN CHLORIDE 5 MG PO TABS: 5.0000 mg | ORAL_TABLET | Freq: Three times a day (TID) | ORAL | Status: DC | PRN

## 2019-10-17 NOTE — Progress Notes (Signed)
[facility] Progress Note   Subjective:   Patient seen and examined at bedside.  No new complaints.  Dialysis tolerated well yesterday using LU AVG, due for dialysis again today. Admits to intermittent SOB.  Denies CP, n/v/d, abdominal pain, edema, dizziness and fatigue.   Objective Vitals:   10/16/19 2352 10/17/19 0317 10/17/19 0405 10/17/19 0754  BP: 130/61  (!) 143/81 (!) 103/42  Pulse: 79  83 83  Resp: 16  17 (!) 24  Temp: 98.1 F (36.7 C)  98.2 F (36.8 C) 97.7 F (36.5 C)  TempSrc: Oral  Oral Oral  SpO2: 97%  96% 95%  Weight:  98.5 kg    Height:       Physical Exam General:NAD, WDWN Heart:RRR Lungs:CTAB, nml WOB Abdomen:soft, NTND Extremities:no LE edema Dialysis Access: LU AVG +b/t, HD cath R IJ   [facility] Weights   10/15/19 0803 10/16/19 1340 10/17/19 0317  Weight: 94.2 kg 99.7 kg 98.5 kg    Intake/Output Summary (Last 24 hours) at 10/17/2019 1132 Last data filed at 10/17/2019 0911 Gross per 24 hour  Intake 240 ml  Output 2150 ml  Net -1910 ml    Additional Objective Labs: Basic Metabolic Panel: Recent Labs  Lab 10/12/19 0701 10/12/19 0701 10/13/19 0515 10/13/19 0515 10/14/19 0746 10/15/19 0424 10/16/19 0500  NA 143   < > 140   < > 140 137 135  K 4.1   < > 4.1   < > 3.8 3.9 4.8  CL 103   < > 100   < > 96* 100 99  CO2 24   < > 22   < > 25 26 18*  GLUCOSE 96   < > 119*   < > 106* 105* 82  BUN 47*   < > 64*   < > 40* 55* 65*  CREATININE 7.52*   < > 9.04*   < > 6.25* 8.46* 9.02*  CALCIUM 9.1   < > 9.0   < > 9.2 8.5* 8.7*  PHOS 6.8*  --  6.9*  --  6.2*  --   --    < > = values in this interval not displayed.   Liver Function Tests: Recent Labs  Lab 10/12/19 0701 10/13/19 0515 10/14/19 0746  ALBUMIN 2.6* 2.8* 2.8*   CBC: Recent Labs  Lab 10/11/19 0247 10/11/19 0247 10/12/19 0701 10/12/19 0701 10/13/19 0515 10/14/19 0746 10/16/19 0500  WBC 13.0*   < > 10.2   < > 13.4* 10.0 11.5*  NEUTROABS 11.2*   < > 7.6   < > 10.6* 7.2  9.7*  HGB 11.8*   < > 11.7*   < > 12.1* 12.1* 10.2*  HCT 36.2*   < > 36.6*   < > 37.8* 37.4* 32.1*  MCV 100.0  --  101.4*  --  101.9* 101.6* 101.6*  PLT 157   < > 171   < > 191 190 255   < > = values in this interval not displayed.   CBG: Recent Labs  Lab 10/15/19 0727 10/15/19 1142 10/16/19 2112 10/17/19 0613 10/17/19 0758  GLUCAP 117* 108* 122* 92 76    Lab Results  Component Value Date   INR 1.0 10/07/2019   INR 1.1 01/27/2019   Studies/Results: No results found.  Medications: . sodium chloride 0 mL/hr at 10/14/19 1819  . sodium chloride 10 mL/hr at 10/15/19 0754   . aspirin EC  81 mg Oral Daily  . atorvastatin  20 mg Oral  [id number]  . calcitRIOL  0.5 mcg Oral Q T,Th,Sa-HD  . Chlorhexidine Gluconate Cloth  6 each Topical Q0600  . ciprofloxacin  500 mg Oral Daily  . clotrimazole   Topical BID  . feeding supplement (NEPRO CARB STEADY)  237 mL Oral TID WC  . heparin injection (subcutaneous)  5,000 Units Subcutaneous Q8H  . insulin aspart  0-15 Units Subcutaneous TID WC  . insulin aspart  0-5 Units Subcutaneous QHS  . levothyroxine  175 mcg Oral QAC breakfast  . lidocaine  1 application Urethral Once  . midodrine  10 mg Oral TID WC  . multivitamin  1 tablet Oral QHS  . sevelamer carbonate  2,400 mg Oral TID WC  . sodium chloride flush  10-40 mL Intracatheter Q12H  . ticagrelor  90 mg Oral BID    Dialysis Orders: TTS- [facility] 4hrs, BFR400, [id number], EDW 92kg,2K/2.25Ca Access:LU AVG Heparin5000 Calcitriol0.38mcg PO qHD  Assessment/Plan: 1. Lt sided weakness w/slurred speech/facial droopmultiple bilateral scattered watershed infarcts R > L - Concern for stroke, Rt ICA stenosis ECHO, lipid panel and A1C ordered. Per neuro. S/p ICA stent on 2/4, brilinta BID. Ok for heparin.   2. ESRD-HD TTS.Had  S/p temp cath 1/28 and then AVG declot 1/29. Doing ok on HD today.  Used AVG yesterday and to use again today - if runs full tx today will plan to remove HD  catheter.    3. Hypotension/volume- Blood pressure variable on midodrine as OP.  Does not appear volume overloaded. Bed weights well above outpt EDW.  UF 1-2L today.  4. Anemiaof CKD- Hgb 10.2. No indication for ESA at this time. Follow trends 5. Secondary Hyperparathyroidism -P elevated and binder resumed - renvela 3 TID AC - continue VDRA.  Will recheck phos. Ca in goal.   6. Nutrition- Renal diet w/fluid /mutivit - add nepro  7. DM- per primary 8. Hypothyroidism - on levothyroxine 9. Dysphagia - on regular renal diet 10. UTI/urethritis:  On ciprofloxacin now.  Jen Mow, PA-C [facility] Pager: [phone] 10/17/2019,11:32 AM  LOS: 10 days

## 2019-10-17 NOTE — Progress Notes (Addendum)
[facility] Progress Note  Patient: Frank [name]    [id number]  DOA: 10/07/2019     Date of Service: the patient was seen and examined on 10/17/2019  Chief Complaint  Patient presents with  . Code Stroke   Brief hospital course: ZIAD BALOUN a 79 year old male with past medical history significant for ESRD on hemodialysis, diabetes, hypertension, hyperlipidemia, hypothyroidism, peripheral artery disease, gout, depression who presented to the emergency department on 1/27 with chief complaint of weakness, facial droop, slurred speech with concern for CVA. Neurology was consulted. Patient was admitted to ICU due to patient's low blood pressure and was started on phenylephrine. With improvement off vasopressor, patient was transferred to [facility] on 10/13/2019. Underwent cerebral arteriogram with right ICA revascularization/stent.  Currently further plan is continue therapy and for discharge planning.  Assessment and Plan: 1.  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 Secondary to hypoperfusion Right ICA high-grade stenosis Initially admitted to the ICU. Echocardiogram shows 60 to 65% EF.  No source of embolus. LDL 35 on Lipitor 20 mg due to low LDL. Hemoglobin A1c 6.7. 81 mg aspirin PTA, now on aspirin and Brilinta SP bilateral angiogram followed by stent assisted angioplasty of right ICA proximal with distal protection on 10/15/2019. Neurology currently signed off on 10/10/2019. Follow-up with [facility] in about 4 weeks.  2.  ESRD on HD TTS Clotted AV Fistula Successful declot of left AV fistula 10/09/2019. Temporary HD catheter placement 10/08/2019. Nephrology following. We will discuss with nephrology regarding removal of the temporary HD catheter now that the AV fistula appears to be worsening and functioning.  3.  Chronic hypotension Initially required low-dose Neo-Synephrine drip in the ICU. Currently on midodrine 10  mg 3 times daily. Blood pressure stable. We will monitor. Avoid hypotension if possible.  4.  Type 2 diabetes mellitus, uncontrolled with diabetic neuropathy Hemoglobin A1c 6.7. Continue sliding scale insulin.  5.  Hypothyroidism. Continue Synthroid  6.  UTI Treated with Cipro. Cultures so far no growth.  7.  Hypokalemia. Replaced.  8.  Anemia of chronic kidney disease. Macrocytosis. Recheck 123456 and folic acid level. Also check TSH.  9.  Obesity Body mass index is 34.01 kg/m.  Monitor.  10.  Shortness of breath. Check chest x-ray.  11.  Insomnia. Add Ambien.  12.  Abnormal telemetry. Telemetry called that the patient had some ST changes. EKG was performed. 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 No significant change from prior EKG at the time of the admission and even before the admission.  No further work-up other than serial troponins.  Patient does not have any chest pain  Diet: Renal diet DVT Prophylaxis: Subcutaneous Heparin    Advance goals of care discussion: Full code  Family Communication: no family was present at bedside, at the time of interview.   Disposition:  Pt is from home, admitted with acute stroke, still has significant weakness with PT recommending SNF, which precludes a safe discharge. Discharge to SNF, when bed available.  Subjective: No acute complaint.  Unable to sleep last night.  No nausea no vomiting.  No diarrhea.  No active bleeding.  Physical Exam: General: alert oriented to time, place, and person.  Appear in mild distress, affect appropriate Eyes: PERRL ENT: Oral Mucosa Clear, moist  Neck: no JVD,  Cardiovascular: S1 and S2 Present, no Murmur,  Respiratory: Increased respiratory effort, Bilateral Air entry equal and Decreased, no Crackles, no wheezes Abdomen: Bowel Sound present,  Soft and no tenderness,  Skin: no rash  Extremities: no Pedal edema, no calf tenderness Neurologic: without any new  focal findings Gait not checked due to patient safety concerns  Vitals:   10/16/19 2352 10/17/19 0317 10/17/19 0405 10/17/19 0754  BP: 130/61  (!) 143/81 (!) 103/42  Pulse: 79  83 83  Resp: 16  17 (!) 24  Temp: 98.1 F (36.7 C)  98.2 F (36.8 C) 97.7 F (36.5 C)  TempSrc: Oral  Oral Oral  SpO2: 97%  96% 95%  Weight:  98.5 kg    Height:        Intake/Output Summary (Last 24 hours) at 10/17/2019 [id number] Last data filed at 10/17/2019 [id number] Gross per 24 hour  Intake 244.53 ml  Output 2150 ml  Net -1905.47 ml   Filed Weights   10/15/19 0803 10/16/19 1340 10/17/19 0317  Weight: 94.2 kg 99.7 kg 98.5 kg    Data Reviewed: I have personally reviewed and interpreted daily labs, tele strips, imagings as discussed above. I reviewed all nursing notes, pharmacy notes, vitals, pertinent old records I have discussed plan of care as described above with RN and patient/family.  CBC: Recent Labs  Lab 10/11/19 0247 10/12/19 0701 10/13/19 0515 10/14/19 0746 10/16/19 0500  WBC 13.0* 10.2 13.4* 10.0 11.5*  NEUTROABS 11.2* 7.6 10.6* 7.2 9.7*  HGB 11.8* 11.7* 12.1* 12.1* 10.2*  HCT 36.2* 36.6* 37.8* 37.4* 32.1*  MCV 100.0 101.4* 101.9* 101.6* 101.6*  PLT 157 171 191 190 [id number]   Basic Metabolic Panel: Recent Labs  Lab 10/10/19 1244 10/10/19 1244 10/11/19 0247 10/11/19 0247 10/12/19 0701 10/13/19 0515 10/14/19 0746 10/15/19 0424 10/16/19 0500  NA 137   < > 140   < > 143 140 140 137 135  K 3.3*   < > 3.3*   < > 4.1 4.1 3.8 3.9 4.8  CL 98   < > 101   < > 103 100 96* 100 99  CO2 25   < > 23   < > 24 22 25 26  18*  GLUCOSE 243*   < > 170*   < > 96 119* 106* 105* 82  BUN 22   < > 35*   < > 47* 64* 40* 55* 65*  CREATININE 4.05*   < > 5.45*   < > 7.52* 9.04* 6.25* 8.46* 9.02*  CALCIUM 8.7*   < > 8.9   < > 9.1 9.0 9.2 8.5* 8.7*  MG 1.6*  --   --   --   --   --   --   --   --   PHOS 3.1  --  4.5  --  6.8* 6.9* 6.2*  --   --    < > = values in this interval not displayed.    Studies: No  results found.  Scheduled Meds: . aspirin EC  81 mg Oral Daily  . atorvastatin  20 mg Oral q1800  . calcitRIOL  0.5 mcg Oral Q T,Th,Sa-HD  . Chlorhexidine Gluconate Cloth  6 each Topical Q0600  . ciprofloxacin  500 mg Oral Daily  . clotrimazole   Topical BID  . feeding supplement (NEPRO CARB STEADY)  237 mL Oral TID WC  . heparin injection (subcutaneous)  5,000 Units Subcutaneous Q8H  . insulin aspart  0-15 Units Subcutaneous TID WC  . insulin aspart  0-5 Units Subcutaneous QHS  . levothyroxine  175 mcg Oral QAC breakfast  . lidocaine  1 application Urethral Once  .  midodrine  10 mg Oral TID WC  . multivitamin  1 tablet Oral QHS  . sevelamer carbonate  2,400 mg Oral TID WC  . sodium chloride flush  10-40 mL Intracatheter Q12H  . ticagrelor  90 mg Oral BID   Continuous Infusions: . sodium chloride 0 mL/hr at 10/14/19 1819  . sodium chloride 10 mL/hr at 10/15/19 0754   PRN Meds: acetaminophen **OR** acetaminophen (TYLENOL) oral liquid 160 mg/5 mL **OR** acetaminophen, heparin, ketoconazole, sodium chloride flush  Time spent: 35 minutes  Author: Berle Mull, MD [facility] 10/17/2019 9:38 AM  To reach On-call, see care teams to locate the attending and reach out to them via [email or web address]. If 7PM-7AM, please contact night-coverage If you still have difficulty reaching the attending provider, please page the [facility] (Director on Call) for [facility] on amion for assistance.

## 2019-10-17 NOTE — TOC Progression Note (Signed)
Transition of Care [facility]) - Progression Note    Patient Details  Name: Frank [name] MRN: [id number] Date of Birth: 1941/06/27  Transition of Care [facility]) CM/SW [place], [place] Phone Number: 10/17/2019, 11:40 AM  Clinical Narrative:   CSW following for SNF placement. Patient has no bed offers at this time. CSW faxed referral out to a greater geographical area to try to locate a SNF bed for patient. CSW to follow.    Expected Discharge Plan: [place] Barriers to Discharge: Continued Medical Work up, SNF Pending bed offer  Expected Discharge Plan and Services Expected Discharge Plan: [place] Choice: [place] arrangements for the past 2 months: Single Family Home                                       Social Determinants of Health (SDOH) Interventions    Readmission Risk Interventions No flowsheet data found.

## 2019-10-17 NOTE — Progress Notes (Signed)
   10/17/19 1608  Vitals  Temp 98 F (36.7 C)  Temp Source Oral  BP 136/69  MAP (mmHg) 89  BP Location Right Arm  BP Method Automatic  Patient Position (if appropriate) Lying  Pulse Rate 88  Resp (!) 26  Oxygen Therapy  SpO2 96 %  O2 Device Room Air    Tele called about ST elevation >2 in the MCL. Vitals stable, no acute distress, MD notified, and EKG transmitted. No new orders, MD aware.

## 2019-10-18 ENCOUNTER — Inpatient Hospital Stay (HOSPITAL_COMMUNITY): Payer: Medicare Other

## 2019-10-18 DIAGNOSIS — I441 Atrioventricular block, second degree: Secondary | ICD-10-CM

## 2019-10-18 DIAGNOSIS — I499 Cardiac arrhythmia, unspecified: Secondary | ICD-10-CM

## 2019-10-18 LAB — GLUCOSE, CAPILLARY
Glucose-Capillary: 148 mg/dL — ABNORMAL HIGH (ref 70–99)
Glucose-Capillary: 131 mg/dL — ABNORMAL HIGH (ref 70–99)
Glucose-Capillary: 118 mg/dL — ABNORMAL HIGH (ref 70–99)
Glucose-Capillary: 156 mg/dL — ABNORMAL HIGH (ref 70–99)
Glucose-Capillary: 104 mg/dL — ABNORMAL HIGH (ref 70–99)

## 2019-10-18 LAB — BASIC METABOLIC PANEL
Anion gap: 15 (ref 5–15)
BUN: 17 mg/dL (ref 8–23)
CO2: 25 mmol/L (ref 22–32)
Calcium: 8.8 mg/dL — ABNORMAL LOW (ref 8.9–10.3)
Chloride: 97 mmol/L — ABNORMAL LOW (ref 98–111)
Creatinine, Ser: 4.12 mg/dL — ABNORMAL HIGH (ref 0.61–1.24)
GFR calc Af Amer: 15 mL/min — ABNORMAL LOW (ref >60)
GFR calc non Af Amer: 13 mL/min — ABNORMAL LOW (ref >60)
Glucose, Bld: 141 mg/dL — ABNORMAL HIGH (ref 70–99)
Potassium: 3.5 mmol/L (ref 3.5–5.1)
Sodium: 137 mmol/L (ref 135–145)

## 2019-10-18 LAB — CBC
HCT: 33.6 % — ABNORMAL LOW (ref 39.0–52.0)
Hemoglobin: 11.2 g/dL — ABNORMAL LOW (ref 13.0–17.0)
MCH: 32.5 pg (ref 26.0–34.0)
MCHC: 33.3 g/dL (ref 30.0–36.0)
MCV: 97.4 fL (ref 80.0–100.0)
Platelets: 265 10*3/uL (ref 150–400)
RBC: 3.45 MIL/uL — ABNORMAL LOW (ref 4.22–5.81)
RDW: 12.8 % (ref 11.5–15.5)
WBC: 9.9 10*3/uL (ref 4.0–10.5)
nRBC: 0 % (ref 0.0–0.2)

## 2019-10-18 LAB — MAGNESIUM: Magnesium: 1.7 mg/dL (ref 1.7–2.4)

## 2019-10-18 LAB — AMMONIA: Ammonia: 24 umol/L (ref 9–35)

## 2019-10-18 LAB — FOLATE: Folate: 37 ng/mL (ref >5.9)

## 2019-10-18 LAB — VITAMIN B12: Vitamin B-12: 1437 pg/mL — ABNORMAL HIGH (ref 180–914)

## 2019-10-18 MED ORDER — QUETIAPINE FUMARATE 25 MG PO TABS
12.5000 mg | ORAL_TABLET | Freq: Every day | ORAL | Status: DC
  Administered 2019-10-18 – 2019-10-23 (×6): 12.5 mg via ORAL
  Filled 2019-10-18 (×6): qty 1

## 2019-10-18 MED ORDER — MIDODRINE HCL 5 MG PO TABS
10.0000 mg | ORAL_TABLET | Freq: Two times a day (BID) | ORAL | Status: DC
  Administered 2019-10-18 – 2019-10-21 (×6): 10 mg via ORAL
  Filled 2019-10-18 (×6): qty 2

## 2019-10-18 NOTE — Progress Notes (Signed)
Tele called to report episode of Wenckebach. Pt assessed and remains at baseline. Provider notified.

## 2019-10-18 NOTE — Progress Notes (Signed)
[facility] Progress Note   Subjective:   Successful HD yesterday with LUE AVG.  This AM delirious talking about flying saucers.  Denies c/o.   Objective Vitals:   10/18/19 0102 10/18/19 0203 10/18/19 0330 10/18/19 0801  BP: (!) 153/64  (!) 152/70 (!) 144/75  Pulse: 94  91 96  Resp: 19  18 18   Temp: 98.3 F (36.8 C)  98.1 F (36.7 C) 98 F (36.7 C)  TempSrc: Oral  Oral   SpO2: 97%  95% 95%  Weight:  95.1 kg    Height:       Physical Exam General:NAD, WDWN Heart:RRR Lungs:CTAB, nml WOB Abdomen:soft, NTND Extremities:no LE edema Dialysis Access: LU AVG +b/t, HD cath R IJ   Filed Weights   10/17/19 2050 10/18/19 0030 10/18/19 0203  Weight: 99 kg 98 kg 95.1 kg    Intake/Output Summary (Last 24 hours) at 10/18/2019 1134 Last data filed at 10/18/2019 0934 Gross per 24 hour  Intake 340 ml  Output 2350 ml  Net -2010 ml    Additional Objective Labs: Basic Metabolic Panel: Recent Labs  Lab 10/13/19 0515 10/13/19 0515 10/14/19 0746 10/15/19 0424 10/16/19 0500 10/17/19 1122 10/18/19 0446  NA 140   < > 140   < > 135 138 137  K 4.1   < > 3.8   < > 4.8 3.9 3.5  CL 100   < > 96*   < > 99 97* 97*  CO2 22   < > 25   < > 18* 21* 25  GLUCOSE 119*   < > 106*   < > 82 85 141*  BUN 64*   < > 40*   < > 65* 33* 17  CREATININE 9.04*   < > 6.25*   < > 9.02* 6.11* 4.12*  CALCIUM 9.0   < > 9.2   < > 8.7* 8.8* 8.8*  PHOS 6.9*  --  6.2*  --   --  5.7*  --    < > = values in this interval not displayed.   Liver Function Tests: Recent Labs  Lab 10/13/19 0515 10/14/19 0746 10/17/19 1122  ALBUMIN 2.8* 2.8* 3.0*   CBC: Recent Labs  Lab 10/13/19 0515 10/13/19 0515 10/14/19 0746 10/14/19 0746 10/16/19 0500 10/17/19 1122 10/18/19 0446  WBC 13.4*   < > 10.0   < > 11.5* 8.9 9.9  NEUTROABS 10.6*   < > 7.2  --  9.7* 7.2  --   HGB 12.1*   < > 12.1*   < > 10.2* 10.9* 11.2*  HCT 37.8*   < > 37.4*   < > 32.1* 33.6* 33.6*  MCV 101.9*  --  101.6*  --  101.6* 99.7  97.4  PLT 191   < > 190   < > 255 259 265   < > = values in this interval not displayed.   CBG: Recent Labs  Lab 10/17/19 1209 10/17/19 1609 10/17/19 2006 10/18/19 0110 10/18/19 0622  GLUCAP 102* 117* 98 148* 131*    Lab Results  Component Value Date   INR 1.0 10/07/2019   INR 1.1 01/27/2019   Studies/Results: DG CHEST PORT 1 VIEW  Result Date: 10/17/2019 CLINICAL DATA:  End-stage renal disease. EXAM: PORTABLE CHEST 1 VIEW COMPARISON:  October 07, 2019. FINDINGS: Stable cardiomediastinal silhouette. No pneumothorax is noted. Mild bibasilar atelectasis or infiltrates are noted. Right internal jugular catheter is noted with distal tip in expected position of the SVC. Bony thorax is  unremarkable. IMPRESSION: Mild bibasilar subsegmental atelectasis or infiltrates. Electronically Signed   By: Marijo Conception M.D.   On: 10/17/2019 13:18    Medications: . sodium chloride 0 mL/hr at 10/14/19 1819  . sodium chloride 10 mL/hr at 10/15/19 0754   . aspirin EC  81 mg Oral Daily  . atorvastatin  20 mg Oral q1800  . calcitRIOL  0.5 mcg Oral Q T,Th,Sa-HD  . Chlorhexidine Gluconate Cloth  6 each Topical Q0600  . ciprofloxacin  500 mg Oral Daily  . clotrimazole   Topical BID  . feeding supplement (NEPRO CARB STEADY)  237 mL Oral TID WC  . heparin injection (subcutaneous)  5,000 Units Subcutaneous Q8H  . insulin aspart  0-15 Units Subcutaneous TID WC  . insulin aspart  0-5 Units Subcutaneous QHS  . levothyroxine  175 mcg Oral QAC breakfast  . lidocaine  1 application Urethral Once  . midodrine  10 mg Oral BID WC  . multivitamin  1 tablet Oral QHS  . sevelamer carbonate  2,400 mg Oral TID WC  . sodium chloride flush  10-40 mL Intracatheter Q12H  . ticagrelor  90 mg Oral BID    Dialysis Orders: TTS- [facility] 4hrs, BFR400, [id number], EDW 92kg,2K/2.25Ca Access:LU AVG Heparin5000 Calcitriol0.34mcg PO qHD  Assessment/Plan: 1. Lt sided weakness w/slurred speech/facial  droopmultiple bilateral scattered watershed infarcts R > L - Concern for stroke, Rt ICA stenosis ECHO, lipid panel and A1C ordered. Per neuro. S/p ICA stent on 2/4, brilinta BID. Ok for heparin.   2. ESRD-HD TTS.Had  S/p temp cath 1/28 and then AVG declot 1/29. Did fine with HD yesterday, next will be due 2/9.  Used AVG successfully x 2.  IV team c/s to remove RIJ Temp cath.    3. Hypotension/volume- Blood pressure variable on midodrine as OP.  Does not appear volume overloaded. Bed weights well above outpt EDW.  UF 1-2L today.  4. Anemiaof CKD- Hgb 11.2. No indication for ESA at this time. 5. Secondary Hyperparathyroidism -P elevated and binder resumed - renvela 3 TID AC - continue VDRA.   Ca in goal.   6. Nutrition- Renal diet w/fluid /mutivit - add nepro  7. DM- per primary 8. Hypothyroidism - on levothyroxine 9. Dysphagia - on regular renal diet 10. UTI/urethritis:  On ciprofloxacin now. 11. Dispo: awaiting snf  Jannifer Hick MD [facility] Pager [phone]

## 2019-10-18 NOTE — Progress Notes (Signed)
[facility] Progress Note  Patient: Frank [name]    [id number]  DOA: 10/07/2019     Date of Service: the patient was seen and examined on 10/18/2019  Chief Complaint  Patient presents with  . Code Stroke   Brief hospital course: Frank [name] a 79 year old male with past medical history significant for ESRD on hemodialysis, diabetes, hypertension, hyperlipidemia, hypothyroidism, peripheral artery disease, gout, depression who presented to the emergency department on 1/27 with chief complaint of weakness, facial droop, slurred speech with concern for CVA. Neurology was consulted. Patient was admitted to ICU due to patient's low blood pressure and was started on phenylephrine. With improvement off vasopressor, patient was transferred to [facility] on 10/13/2019. Underwent cerebral arteriogram with right ICA revascularization/stent.  Currently further plan is continue therapy and for discharge planning.  Assessment and Plan: 1.  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 Secondary to hypoperfusion Right ICA high-grade stenosis Initially admitted to the ICU. Echocardiogram shows 60 to 65% EF.  No source of embolus. LDL 35 on Lipitor 20 mg due to low LDL. Hemoglobin A1c 6.7. 81 mg aspirin PTA, now on aspirin and Brilinta SP bilateral angiogram followed by stent assisted angioplasty of right ICA proximal with distal protection on 10/15/2019. Neurology currently signed off on 10/10/2019. Follow-up with [facility] in about 4 weeks.  2.  ESRD on HD TTS Clotted AV Fistula Successful declot of left AV fistula 10/09/2019. Temporary HD catheter placement 10/08/2019. Nephrology following. We will discuss with nephrology regarding removal of the temporary HD catheter now that the AV fistula appears to be worsening and functioning.  3.  Chronic hypotension Initially required low-dose Neo-Synephrine drip in the ICU. Currently on midodrine 10  mg 3 times daily. Blood pressure stable. We will monitor. Avoid hypotension if possible.  4.  Type 2 diabetes mellitus, uncontrolled with diabetic neuropathy Hemoglobin A1c 6.7. Continue sliding scale insulin.  5.  Hypothyroidism. Continue Synthroid  6.  UTI Treated with Cipro. Cultures so far no growth.  7.  Hypokalemia. Replaced.  8.  Anemia of chronic kidney disease. Macrocytosis. Check 123456 and folic acid level.  9.  Obesity Body mass index is 32.84 kg/m.  Monitor.  10.  Shortness of breath. Check chest x-ray.  11.  Insomnia. Acute encephalopathy metabolic  Discontinue Ambien Add Seroquel nightly. Check metabolic work-up as well as CT scan of the head. No focal deficit no asterixis on exam.  12.  Abnormal telemetry. History of second-degree heart block. Telemetry called that the patient had some ST changes. EKG was performed. 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 No significant change from prior EKG at the time of the admission and even before the admission.  No further work-up other than serial troponins.  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  Now in the hospital also had some second-degree heart block. [place] cardiology consultation.  No indication for pacemaker placement for now.  Diet: Renal diet DVT Prophylaxis: Subcutaneous Heparin    Advance goals of care discussion: Full code  Family Communication: no family was present at bedside, at the time of interview.   Disposition:  Pt is from home, admitted with acute stroke, still has significant weakness with PT recommending SNF, which precludes a safe discharge. Discharge to SNF, when bed available.  Subjective: No acute complaint.  No nausea no vomiting no fever no chills.  Physical Exam: General: alert  oriented to time, place, and person.  Appear in mild distress, affect  appropriate Eyes: PERRL ENT: Oral Mucosa Clear, moist  Neck: no JVD,  Cardiovascular: S1 and S2 Present, no Murmur,  Respiratory: Increased respiratory effort, Bilateral Air entry equal and Decreased, no Crackles, no wheezes Abdomen: Bowel Sound present, Soft and no tenderness,  Skin: no rash  Extremities: no Pedal edema, no calf tenderness Neurologic: without any new focal findings Gait not checked due to patient safety concerns  Vitals:   10/18/19 0330 10/18/19 0801 10/18/19 1136 10/18/19 1631  BP: (!) 152/70 (!) 144/75 (!) 141/62 (!) 141/63  Pulse: 91 96 91 88  Resp: 18 18 18 18   Temp: 98.1 F (36.7 C) 98 F (36.7 C) 98 F (36.7 C) 98.3 F (36.8 C)  TempSrc: Oral   Oral  SpO2: 95% 95% 97% 96%  Weight:      Height:        Intake/Output Summary (Last 24 hours) at 10/18/2019 1851 Last data filed at 10/18/2019 1626 Gross per 24 hour  Intake 200 ml  Output 1325 ml  Net -1125 ml   Filed Weights   10/17/19 2050 10/18/19 0030 10/18/19 0203  Weight: 99 kg 98 kg 95.1 kg    Data Reviewed: I have personally reviewed and interpreted daily labs, tele strips, imagings as discussed above. I reviewed all nursing notes, pharmacy notes, vitals, pertinent old records I have discussed plan of care as described above with RN and patient/family.  CBC: Recent Labs  Lab 10/12/19 0701 10/12/19 0701 10/13/19 0515 10/14/19 0746 10/16/19 0500 10/17/19 1122 10/18/19 0446  WBC 10.2   < > 13.4* 10.0 11.5* 8.9 9.9  NEUTROABS 7.6  --  10.6* 7.2 9.7* 7.2  --   HGB 11.7*   < > 12.1* 12.1* 10.2* 10.9* 11.2*  HCT 36.6*   < > 37.8* 37.4* 32.1* 33.6* 33.6*  MCV 101.4*   < > 101.9* 101.6* 101.6* 99.7 97.4  PLT 171   < > 191 190 255 259 265   < > = values in this interval not displayed.   Basic Metabolic Panel: Recent Labs  Lab 10/12/19 0701 10/12/19 0701 10/13/19 0515 10/13/19 0515 10/14/19 0746 10/15/19 0424 10/16/19 0500 10/17/19 1122 10/18/19 0446  NA 143   < > 140   < > 140 137  135 138 137  K 4.1   < > 4.1   < > 3.8 3.9 4.8 3.9 3.5  CL 103   < > 100   < > 96* 100 99 97* 97*  CO2 24   < > 22   < > 25 26 18* 21* 25  GLUCOSE 96   < > 119*   < > 106* 105* 82 85 141*  BUN 47*   < > 64*   < > 40* 55* 65* 33* 17  CREATININE 7.52*   < > 9.04*   < > 6.25* 8.46* 9.02* 6.11* 4.12*  CALCIUM 9.1   < > 9.0   < > 9.2 8.5* 8.7* 8.8* 8.8*  MG  --   --   --   --   --   --   --   --  1.7  PHOS 6.8*  --  6.9*  --  6.2*  --   --  5.7*  --    < > = values in this interval not displayed.    Studies: No results found.  Scheduled Meds: . aspirin EC  81 mg Oral Daily  .  atorvastatin  20 mg Oral q1800  . calcitRIOL  0.5 mcg Oral Q T,Th,Sa-HD  . Chlorhexidine Gluconate Cloth  6 each Topical Q0600  . clotrimazole   Topical BID  . feeding supplement (NEPRO CARB STEADY)  237 mL Oral TID WC  . heparin injection (subcutaneous)  5,000 Units Subcutaneous Q8H  . insulin aspart  0-15 Units Subcutaneous TID WC  . insulin aspart  0-5 Units Subcutaneous QHS  . levothyroxine  175 mcg Oral QAC breakfast  . lidocaine  1 application Urethral Once  . midodrine  10 mg Oral BID WC  . multivitamin  1 tablet Oral QHS  . QUEtiapine  12.5 mg Oral QHS  . sevelamer carbonate  2,400 mg Oral TID WC  . sodium chloride flush  10-40 mL Intracatheter Q12H  . ticagrelor  90 mg Oral BID   Continuous Infusions: . sodium chloride 0 mL/hr at 10/14/19 1819  . sodium chloride 10 mL/hr at 10/15/19 0754   PRN Meds: acetaminophen **OR** acetaminophen (TYLENOL) oral liquid 160 mg/5 mL **OR** acetaminophen, ketoconazole, oxybutynin, sodium chloride flush, zolpidem  Time spent: 35 minutes  Author: Berle Mull, MD [facility] 10/18/2019 6:51 PM  To reach On-call, see care teams to locate the attending and reach out to them via [email or web address]. If 7PM-7AM, please contact night-coverage If you still have difficulty reaching the attending provider, please page the [facility] (Director on Call) for [facility] on  amion for assistance.

## 2019-10-18 NOTE — Consult Note (Signed)
Cardiology Consultation:   Patient ID: Frank [name] MRN: [id number]; DOB: 1940/12/17  Admit date: 10/07/2019 Date of Consult: 10/18/2019  Primary Care Provider: McLean-Scocuzza, Nino Glow, MD Primary Cardiologist: Thompson Grayer, MD  Primary Electrophysiologist:  None    Patient Profile:   Frank [name] is a 79 y.o. male with a hx of ESRD, DM, HTN, HLD, PAD, hypothyroidism who is being seen today for the evaluation of heart rhythm abnormalities at the request of Dr Posey Pronto  History of Present Illness:   Frank [name] is a 85M with above medical history who presented on 1/27 with acute CVA.  Found to have multiple infarcts right more than left in watershed territories. Underwent cerebral arteriogram with right ICA revascularization/stent.   TTE shows EF 60-65%, no source of embolus.  He follows with Dr Rayann Heman after reportedly had second-degree heart block after AV fistula surgery.  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  No symptoms with this.  He currently denies any chest pain, dyspnea, lightheadedness, syncope.    Heart Pathway Score:     Past Medical History:  Diagnosis Date  . Anemia of chronic kidney failure   . Arthritis   . Bradycardia   . Cataracts, bilateral 05/15/2012  . Chronic idiopathic gout of multiple sites 02/10/2015  . Chronic kidney disease    [facility]  . Depression   . Diabetes mellitus without complication (HCC)    diet controlled   . Diabetic nephropathy ([place])   . ED (erectile dysfunction) 08/28/2012  . Elevated liver enzymes 06/10/2019  . Epiretinal membrane (ERM) of right eye 06/10/2019   Right eye saw AE 06/05/2019    . Essential hypertension 05/07/2013   Last Assessment & Plan:  Formatting of this note might be different from the original. HTN PLAN   BP goal is <140/90 BP Readings from Last 3 Encounters:  08/27/16 140/79  08/13/16 141/80  08/06/16 144/86   Current Anti-Hyptensive Medications: None  Today's Recommendations:  Continue lifestyle modifications. Defer starting medication to PCP.  Recommend continued regular exercise as tolerated. Recomm  . History of UTI    s/p foley in hospital 01/2019   . Hyperlipidemia   . Hypothyroidism   . Illiterate 11/19/2013   Last Assessment & Plan:  Assisted patient with Handicapped Placard application today.    . Insomnia 05/08/2019  . Neuropathy   . Peripheral neuropathy   . Peripheral vascular disease ([place])   . Secondary hyperparathyroidism ([place])    Renal  . Thyroid disease     Past Surgical History:  Procedure Laterality Date  . AV FISTULA PLACEMENT Left 11/19/2017   Procedure: ARTERIOVENOUS (AV) FISTULA CREATION;  Surgeon: [name], MD;  Location: [facility];  Service: Vascular;  Laterality: Left;  . BASCILIC VEIN TRANSPOSITION Left 06/04/2018   Procedure: BASILIC VEIN TRANSPOSITION ARM;  Surgeon: [name], MD;  Location: [facility];  Service: Vascular;  Laterality: Left;  . COLONOSCOPY    . DIALYSIS/PERMA CATHETER REMOVAL N/A 03/23/2019   Procedure: DIALYSIS/PERMA CATHETER REMOVAL;  Surgeon: [name], MD;  Location: [facility];  Service: Cardiovascular;  Laterality: N/A;  . EYE SURGERY     cataract surgery b/l; photophobia since   . INSERTION OF DIALYSIS CATHETER N/A 01/23/2019   Procedure: INSERTION OF DIALYSIS CATHETER;  Surgeon: [name], MD;  Location: [facility];  Service: Vascular;  Laterality: N/A;  INSERTION OF DIALYSIS CATHETER  . IR FLUORO GUIDE CV LINE RIGHT  10/08/2019  .  IR THROMBECTOMY AV FISTULA W/THROMBOLYSIS/PTA INC/SHUNT/IMG LEFT Left 10/09/2019  . IR US GUIDE VASC ACCESS LEFT  10/09/2019  . IR US GUIDE VASC ACCESS RIGHT  10/08/2019  . KNEE ARTHROSCOPY Left    Knee  . PERIPHERAL VASCULAR THROMBECTOMY Left 07/06/2019   Procedure: PERIPHERAL VASCULAR THROMBECTOMY;  Surgeon: [name], MD;  Location: [facility];  Service: Cardiovascular;  Laterality: Left;  . RADIOLOGY WITH ANESTHESIA N/A 10/15/2019    Procedure: stent placement;  Surgeon: Luanne Bras, MD;  Location: [facility];  Service: Radiology;  Laterality: N/A;  . THROMBECTOMY W/ EMBOLECTOMY Left 01/27/2019   Procedure: THROMBECTOMY Left arm ARTERIOVENOUS FISTULA  and Left arm Arteriovenous gortex graft;  Surgeon: [name], MD;  Location: [facility];  Service: Vascular;  Laterality: Left;  Marland Kitchen VENOGRAM Left 01/27/2019   Procedure: Left arm FISTULOGRAM/;  Surgeon: [name], MD;  Location: [facility];  Service: Vascular;  Laterality: Left;     Inpatient Medications: Scheduled Meds: . aspirin EC  81 mg Frank Daily  . atorvastatin  20 mg Frank q1800  . calcitRIOL  0.5 mcg Frank Q T,Th,Sa-HD  . Chlorhexidine Gluconate Cloth  6 each Topical Q0600  . ciprofloxacin  500 mg Frank Daily  . clotrimazole   Topical BID  . feeding supplement (NEPRO CARB STEADY)  237 mL Frank TID WC  . heparin injection (subcutaneous)  5,000 Units Subcutaneous Q8H  . insulin aspart  0-15 Units Subcutaneous TID WC  . insulin aspart  0-5 Units Subcutaneous QHS  . levothyroxine  175 mcg Frank QAC breakfast  . lidocaine  1 application Urethral Once  . midodrine  10 mg Frank BID WC  . multivitamin  1 tablet Frank QHS  . sevelamer carbonate  2,400 mg Frank TID WC  . sodium chloride flush  10-40 mL Intracatheter Q12H  . ticagrelor  90 mg Frank BID   Continuous Infusions: . sodium chloride 0 mL/hr at 10/14/19 1819  . sodium chloride 10 mL/hr at 10/15/19 0754   PRN Meds: acetaminophen **OR** acetaminophen (TYLENOL) Frank liquid 160 mg/5 mL **OR** acetaminophen, ketoconazole, oxybutynin, sodium chloride flush, zolpidem  Allergies:    Allergies  Allergen Reactions  . Ace Inhibitors Other (See Comments)    Hyperkalemia on ACE INHIBITORS Cr>1.9  . Angiotensin Receptor Blockers Other (See Comments)    Hyperkalemia Elevates Cr > 1.9   . Ivp Dye [Iodinated Diagnostic Agents] Other (See Comments)    Cannot have due to renal failure  . Adhesive [Tape] Other (See  Comments)    UNDESCRIBED REACTION Can tolerate ONLY Coban wrap or mild paper tape!!    Social History:   Social History   Socioeconomic History  . Marital status: Divorced    Spouse name: Not on file  . Number of children: 4  . Years of education: Not on file  . Highest education level: 8th grade  Occupational History  . Not on file  Tobacco Use  . Smoking status: Former Smoker    Years: 30.00    Quit date: 1994    Years since quitting: 27.1  . Smokeless tobacco: Never Used  Substance and Sexual Activity  . Alcohol use: No  . Drug use: No  . Sexual activity: Not Currently  Other Topics Concern  . Not on file  Social History Narrative   4 kids (3 daughters and 1 son)   Daughter [name] DPR   Social Determinants of Health   Financial Resource Strain: Low Risk   .  Difficulty of Paying Living Expenses: Not hard at all  Food Insecurity: No Food Insecurity  . Worried About Charity fundraiser in the Last Year: Never true  . Ran Out of Food in the Last Year: Never true  Transportation Needs: No Transportation Needs  . Lack of Transportation (Medical): No  . Lack of Transportation (Non-Medical): No  Physical Activity:   . Days of Exercise per Week: Not on file  . Minutes of Exercise per Session: Not on file  Stress: No Stress Concern Present  . Feeling of Stress : Only a little  Social Connections: Unknown  . Frequency of Communication with Friends and Family: More than three times a week  . Frequency of Social Gatherings with Friends and Family: Not on file  . Attends Religious Services: Not on file  . Active Member of Clubs or Organizations: Not on file  . Attends Archivist Meetings: Not on file  . Marital Status: Divorced  Human resources officer Violence: Not At Risk  . Fear of Current or Ex-Partner: No  . Emotionally Abused: No  . Physically Abused: No  . Sexually Abused: No    Family History:    Family History  Problem Relation Age of Onset  . Diabetes  Mother   . Kidney disease Mother   . Liver disease Mother   . Heart Problems Mother      ROS:  Please see the history of present illness.   All other ROS reviewed and negative.     Physical Exam/Data:   Vitals:   10/18/19 0203 10/18/19 0330 10/18/19 0801 10/18/19 1136  BP:  (!) 152/70 (!) 144/75 (!) 141/62  Pulse:  91 96 91  Resp:  18 18 18   Temp:  98.1 F (36.7 C) 98 F (36.7 C) 98 F (36.7 C)  TempSrc:  Frank    SpO2:  95% 95% 97%  Weight: 95.1 kg     Height:        Intake/Output Summary (Last 24 hours) at 10/18/2019 1613 Last data filed at 10/18/2019 1000 Gross per 24 hour  Intake 200 ml  Output 1700 ml  Net -1500 ml   Last 3 Weights 10/18/2019 10/18/2019 10/17/2019  Weight (lbs) 209 lb 10.5 oz 216 lb 0.8 oz 218 lb 4.1 oz  Weight (kg) 95.1 kg 98 kg 99 kg     Body mass index is 32.84 kg/m.  General:   in no acute distress HEENT: normal Lymph: no adenopathy Neck: no JVD Endocrine:  No thryomegaly Vascular: No carotid bruits; FA pulses 2+ bilaterally without bruits  Cardiac:  RRR; 2/6 systolic murmur Lungs:  clear to auscultation bilaterally, no wheezing, rhonchi or rales  Abd: soft, nontender Ext: no edema Musculoskeletal:  No deformities Skin: warm and dry  Neuro:  Alert and oriented x3 Psych:  Normal affect   EKG:  The EKG was personally reviewed and demonstrates:  Sinus rhythm, marked first degree AV block, LAFB, Q waves V1-3 Telemetry:  Telemetry was personally reviewed and demonstrates: Sinus rhythm, intermittent Wenckebach.  Occasional blocked PACs, as shown below    Relevant CV Studies: TTE 10/07/19:  1. Left ventricular ejection fraction, by visual estimation, is 60 to  65%. The left ventricle has normal function. There is moderately increased  left ventricular hypertrophy.  2. Left ventricular diastolic parameters are indeterminate.  3. The left ventricle has no regional wall motion abnormalities.  4. Global right ventricle has mildly reduced  systolic function.The right  ventricular size is normal.  No increase in right ventricular wall  thickness.  5. Left atrial size was normal.  6. Right atrial size was normal.  7. The mitral valve is normal in structure. Trivial mitral valve  regurgitation.  8. The tricuspid valve is normal in structure.  9. The tricuspid valve is normal in structure. Tricuspid valve  regurgitation is trivial.  10. The aortic valve is tricuspid. Aortic valve regurgitation is not  visualized. Mild to moderate aortic valve sclerosis/calcification without  any evidence of aortic stenosis.  11. The pulmonic valve was grossly normal. Pulmonic valve regurgitation is  not visualized.  12. Moderately elevated pulmonary artery systolic pressure.  13. No intracardiac source of embolism detected on this transthoracic  study. A transesophageal echocardiogram is recommended to exclude cardiac  source of embolism if clinically indicated.  14. The atrial septum is grossly normal.   Laboratory Data:  High Sensitivity Troponin:   Recent Labs  Lab 10/17/19 1612 10/17/19 1816  TROPONINIHS 74* 77*     Chemistry Recent Labs  Lab 10/16/19 0500 10/17/19 1122 10/18/19 0446  NA 135 138 137  K 4.8 3.9 3.5  CL 99 97* 97*  CO2 18* 21* 25  GLUCOSE 82 85 141*  BUN 65* 33* 17  CREATININE 9.02* 6.11* 4.12*  CALCIUM 8.7* 8.8* 8.8*  GFRNONAA 5* 8* 13*  GFRAA 6* 9* 15*  ANIONGAP 18* 20* 15    Recent Labs  Lab 10/13/19 0515 10/14/19 0746 10/17/19 1122  ALBUMIN 2.8* 2.8* 3.0*   Hematology Recent Labs  Lab 10/16/19 0500 10/17/19 1122 10/18/19 0446  WBC 11.5* 8.9 9.9  RBC 3.16* 3.37* 3.45*  HGB 10.2* 10.9* 11.2*  HCT 32.1* 33.6* 33.6*  MCV 101.6* 99.7 97.4  MCH 32.3 32.3 32.5  MCHC 31.8 32.4 33.3  RDW 13.4 13.2 12.8  PLT 255 259 265   BNPNo results for input(s): BNP, PROBNP in the last 168 hours.  DDimer No results for input(s): DDIMER in the last 168 hours.   Radiology/Studies:  DG CHEST PORT  1 VIEW  Result Date: 10/17/2019 CLINICAL DATA:  End-stage renal disease. EXAM: PORTABLE CHEST 1 VIEW COMPARISON:  October 07, 2019. FINDINGS: Stable cardiomediastinal silhouette. No pneumothorax is noted. Mild bibasilar atelectasis or infiltrates are noted. Right internal jugular catheter is noted with distal tip in expected position of the SVC. Bony thorax is unremarkable. IMPRESSION: Mild bibasilar subsegmental atelectasis or infiltrates. Electronically Signed   By: Marijo Conception M.D.   On: 10/17/2019 13:18   {  Assessment and Plan:   Irregular rhythm: From review of telemetry, patient is having occasional Wenckebach.  In addition there was concern for Mobitz type II block but on review, this appears consistent with blocked PACs, as there is a subtle change in the T wave preceding the dropped beats (shown in rhythm strip above).  In addition, there does not appear to be any periods of bradycardia on telemetry.  No indication for pacemaker at this time.  We will continue to monitor on telemetry  For questions or updates, please contact [facility] Please consult [email or web address] for contact info under     Signed, Donato Heinz, MD  10/18/2019 4:13 PM

## 2019-10-19 LAB — BASIC METABOLIC PANEL
Anion gap: 15 (ref 5–15)
BUN: 24 mg/dL — ABNORMAL HIGH (ref 8–23)
CO2: 26 mmol/L (ref 22–32)
Calcium: 8.8 mg/dL — ABNORMAL LOW (ref 8.9–10.3)
Chloride: 97 mmol/L — ABNORMAL LOW (ref 98–111)
Creatinine, Ser: 5.74 mg/dL — ABNORMAL HIGH (ref 0.61–1.24)
GFR calc Af Amer: 10 mL/min — ABNORMAL LOW (ref >60)
GFR calc non Af Amer: 9 mL/min — ABNORMAL LOW (ref >60)
Glucose, Bld: 124 mg/dL — ABNORMAL HIGH (ref 70–99)
Potassium: 3.5 mmol/L (ref 3.5–5.1)
Sodium: 138 mmol/L (ref 135–145)

## 2019-10-19 LAB — GLUCOSE, CAPILLARY
Glucose-Capillary: 116 mg/dL — ABNORMAL HIGH (ref 70–99)
Glucose-Capillary: 137 mg/dL — ABNORMAL HIGH (ref 70–99)
Glucose-Capillary: 164 mg/dL — ABNORMAL HIGH (ref 70–99)
Glucose-Capillary: 86 mg/dL (ref 70–99)

## 2019-10-19 LAB — CBC
HCT: 34.1 % — ABNORMAL LOW (ref 39.0–52.0)
Hemoglobin: 11.3 g/dL — ABNORMAL LOW (ref 13.0–17.0)
MCH: 32.5 pg (ref 26.0–34.0)
MCHC: 33.1 g/dL (ref 30.0–36.0)
MCV: 98 fL (ref 80.0–100.0)
Platelets: 273 10*3/uL (ref 150–400)
RBC: 3.48 MIL/uL — ABNORMAL LOW (ref 4.22–5.81)
RDW: 12.9 % (ref 11.5–15.5)
WBC: 11 10*3/uL — ABNORMAL HIGH (ref 4.0–10.5)
nRBC: 0 % (ref 0.0–0.2)

## 2019-10-19 LAB — RPR: RPR Ser Ql: NONREACTIVE

## 2019-10-19 MED ORDER — BISACODYL 5 MG PO TBEC
10.0000 mg | DELAYED_RELEASE_TABLET | Freq: Every day | ORAL | Status: DC | PRN
  Administered 2019-10-19 – 2019-10-21 (×2): 10 mg via ORAL
  Filled 2019-10-19 (×2): qty 2

## 2019-10-19 MED ORDER — BISACODYL 5 MG PO TBEC: 10.0000 mg | DELAYED_RELEASE_TABLET | Freq: Every day | ORAL | Status: DC

## 2019-10-19 MED ORDER — OLANZAPINE 5 MG PO TBDP
5.0000 mg | ORAL_TABLET | Freq: Every day | ORAL | Status: DC | PRN
  Administered 2019-10-23: 5 mg via ORAL
  Filled 2019-10-19 (×2): qty 1

## 2019-10-19 MED ORDER — CHLORHEXIDINE GLUCONATE CLOTH 2 % EX PADS
6.0000 | MEDICATED_PAD | Freq: Every day | CUTANEOUS | Status: DC
  Administered 2019-10-19: 6 via TOPICAL

## 2019-10-19 MED ORDER — POLYETHYLENE GLYCOL 3350 17 G PO PACK
17.0000 g | PACK | Freq: Every day | ORAL | Status: DC
  Administered 2019-10-19 – 2019-10-24 (×6): 17 g via ORAL
  Filled 2019-10-19 (×6): qty 1

## 2019-10-19 NOTE — Progress Notes (Signed)
Bladder scan revealed 411 ML of urine. Dr. Jabier Mutton MD notified. New orders noted. 14 FR/10 ML Foley catheter inserted using sterile technique with immediate return of 600 ml clear/yellow colored urine. Patient tolerated well. States he feels better. Securement device in place. Will continue to monitor.

## 2019-10-19 NOTE — Progress Notes (Signed)
Occupational Therapy Treatment Patient Details Name: Frank [name] MRN: [id number] DOB: 10-May-1941 Today's Date: 10/19/2019    History of present illness 79 yo admitted with slurred speech and facial droop with multifocal watershed infarcts. PMhx: ESRD, DM, HTN, HLD, neuropathy, PAD, depression, gout   OT comments  Pt making steady progress towards OT goals this session. Session focus on functional mobility and standing grooming tasks. Overall, pt requires MAX A for LB ADLs and MIN A for UB ADLs. Pt easily distracted needing max cues to orient back to task. Pt fearful of falling throughout session needing MAX encouragement to complete functional mobility from EOB>sink>doorway>recliner with RW. Pt required MIN A +2 for x2 sit<>stands with RW needing MAX hand over hand assist for hand placement and overall technique with little carryover. DC plan currently remains appropriate, will follow acutely per POC.    Follow Up Recommendations  SNF;Supervision/Assistance - 24 hour    Equipment Recommendations  3 in 1 bedside commode    Recommendations for Other Services      Precautions / Restrictions Precautions Precautions: Fall Restrictions Weight Bearing Restrictions: No       Mobility Bed Mobility Overal bed mobility: Needs Assistance Bed Mobility: Rolling;Sidelying to Sit Rolling: Min guard Sidelying to sit: Min assist;HOB elevated       General bed mobility comments: cues for hand placement and MIN A to elevate trunk into sitting with HOB slightly elevated  Transfers Overall transfer level: Needs assistance Equipment used: Rolling walker (2 wheeled) Transfers: Sit to/from Stand Sit to Stand: Min assist;+2 safety/equipment;+2 physical assistance         General transfer comment: pt sit>stand x2, once from EOB and once from [facility] needing MAX cues for hand placement with little carryover despite hand over hand assist. MIN A +2 to power into standing    Balance Overall balance  assessment: Needs assistance;History of Falls Sitting-balance support: No upper extremity supported;Feet supported Sitting balance-Leahy Scale: Fair Sitting balance - Comments: min guard- MIN A for sitting balance. able to attempt to don socks from EOB with close min guard   Standing balance support: No upper extremity supported;During functional activity;Single extremity supported Standing balance-Leahy Scale: Poor Standing balance comment: min a- min guard  for balance while standing at sink                           ADL either performed or assessed with clinical judgement   ADL Overall ADL's : Needs assistance/impaired     Grooming: Wash/dry face;Oral care;Standing;Min guard;Cueing for sequencing;Cueing for safety;Minimal assistance Grooming Details (indicate cue type and reason): pt completed standing grooming tasks at sink with min guard- min a for balance needing intermitent cues for safety and sequencing ADLs at pt very distracted needing cues to reorient back to task Upper Body Bathing: Minimal assistance;Sitting       Upper Body Dressing : Minimal assistance;Sitting Upper Body Dressing Details (indicate cue type and reason): hospital gown as back side cover Lower Body Dressing: Maximal assistance;Sitting/lateral leans Lower Body Dressing Details (indicate cue type and reason): pt unable to don socks from EOB without MAX A. unable to figure four Toilet Transfer: Moderate assistance;RW;Cueing for safety;Cueing for sequencing;Minimal assistance;Ambulation;+2 for physical assistance;+2 for safety/equipment;BSC Toilet Transfer Details (indicate cue type and reason): simulated via functional mobility with RW; MIN - MOD A +2 for safety as pt worried about falling and needed directional cues to manage RW and p/u L foot during ambulation  Functional mobility during ADLs: Moderate assistance;Rolling walker;Cueing for sequencing;Cueing for safety;Minimal assistance;+2  for safety/equipment;+2 for physical assistance General ADL Comments: session focus on funcitonal mobilit, toilet transfer and standing grooming tasks. Pt easily distracted needing cues to reorient to task. Pt anxious with movement needing +2 assist for safety and encouragement     Vision       Perception     Praxis      Cognition Arousal/Alertness: Awake/alert Behavior During Therapy: Anxious;WFL for tasks assessed/performed(anxious with movement; fearful of falling) Overall Cognitive Status: Impaired/Different from baseline Area of Impairment: Attention;Problem solving;Safety/judgement;Following commands;Memory                   Current Attention Level: Focused Memory: Decreased short-term memory Following Commands: Follows one step commands with increased time;Follows one step commands inconsistently Safety/Judgement: Decreased awareness of deficits;Decreased awareness of safety   Problem Solving: Slow processing;Requires verbal cues;Requires tactile cues General Comments: pt alert and oriented at start of session but then states he is at [employer] at end of session. pt very fearful of falling and requires constant cues and hand over hand assist to attend to task and follow commands.        Exercises     Shoulder Instructions       General Comments      Pertinent Vitals/ Pain       Pain Assessment: Faces Faces Pain Scale: Hurts a little bit Pain Location: general discomfort when reaching to feet to attempt donning socks Pain Descriptors / Indicators: Discomfort Pain Intervention(s): Monitored during session  Home Living                                          Prior Functioning/Environment              Frequency  Min 2X/week        Progress Toward Goals  OT Goals(current goals can now be found in the care plan section)  Progress towards OT goals: Progressing toward goals  Acute Rehab OT Goals Patient Stated Goal: return  home OT Goal Formulation: With patient Time For Goal Achievement: 10/23/19 Potential to Achieve Goals: Good  Plan Discharge plan remains appropriate    Co-evaluation                 AM-PAC OT "6 Clicks" Daily Activity     Outcome Measure   Help from another person eating meals?: A Little Help from another person taking care of personal grooming?: A Little Help from another person toileting, which includes using toliet, bedpan, or urinal?: A Lot Help from another person bathing (including washing, rinsing, drying)?: A Lot Help from another person to put on and taking off regular upper body clothing?: A Little Help from another person to put on and taking off regular lower body clothing?: A Lot 6 Click Score: 15    End of Session Equipment Utilized During Treatment: Gait belt;Rolling walker  OT Visit Diagnosis: Unsteadiness on feet (R26.81);Other abnormalities of gait and mobility (R26.89);Muscle weakness (generalized) (M62.81);Other symptoms and signs involving cognitive function   Activity Tolerance Patient tolerated treatment well   Patient Left in chair;with call bell/phone within reach;with chair alarm set   Nurse Communication          Time: [phone] OT Time Calculation (min): 30 min  Charges: OT General Charges $OT Visit: 1 Visit OT Treatments $Self Care/Home  Management : 8-22 mins  Lanier Clam., COTA/L Acute Rehabilitation Services [phone] [place] 10/19/2019, 10:42 AM

## 2019-10-19 NOTE — Progress Notes (Signed)
2119 cbg 137; machine not transmitting

## 2019-10-19 NOTE — Progress Notes (Signed)
[facility] Progress Note   Subjective:   Successful HD yesterday with LUE AVG.  This AM delirious talking about flying saucers.  Denies c/o.   Objective Vitals:   10/18/19 1937 10/19/19 0044 10/19/19 0334 10/19/19 0755  BP: (!) 153/63  121/88 (!) 154/81  Pulse: 88  97 92  Resp: 18  19 17   Temp: 98.2 F (36.8 C) 98.4 F (36.9 C) 97.8 F (36.6 C) 97.9 F (36.6 C)  TempSrc: Oral Oral Oral Oral  SpO2: 96%  97% 97%  Weight:      Height:       Physical Exam General:NAD, WDWN Heart:RRR Lungs:CTAB, nml WOB Abdomen:soft, NTND Extremities:no LE edema Dialysis Access: LU AVG +b/t, HD cath R IJ   Filed Weights   10/17/19 2050 10/18/19 0030 10/18/19 0203  Weight: 99 kg 98 kg 95.1 kg    Intake/Output Summary (Last 24 hours) at 10/19/2019 1110 Last data filed at 10/19/2019 0600 Gross per 24 hour  Intake 0 ml  Output 700 ml  Net -700 ml    Additional Objective Labs: Basic Metabolic Panel: Recent Labs  Lab 10/13/19 0515 10/13/19 0515 10/14/19 0746 10/15/19 0424 10/17/19 1122 10/18/19 0446 10/19/19 0126  NA 140   < > 140   < > 138 137 138  K 4.1   < > 3.8   < > 3.9 3.5 3.5  CL 100   < > 96*   < > 97* 97* 97*  CO2 22   < > 25   < > 21* 25 26  GLUCOSE 119*   < > 106*   < > 85 141* 124*  BUN 64*   < > 40*   < > 33* 17 24*  CREATININE 9.04*   < > 6.25*   < > 6.11* 4.12* 5.74*  CALCIUM 9.0   < > 9.2   < > 8.8* 8.8* 8.8*  PHOS 6.9*  --  6.2*  --  5.7*  --   --    < > = values in this interval not displayed.   Liver Function Tests: Recent Labs  Lab 10/13/19 0515 10/14/19 0746 10/17/19 1122  ALBUMIN 2.8* 2.8* 3.0*   CBC: Recent Labs  Lab 10/14/19 0746 10/14/19 0746 10/16/19 0500 10/16/19 0500 10/17/19 1122 10/18/19 0446 10/19/19 0126  WBC 10.0   < > 11.5*   < > 8.9 9.9 11.0*  NEUTROABS 7.2  --  9.7*  --  7.2  --   --   HGB 12.1*   < > 10.2*   < > 10.9* 11.2* 11.3*  HCT 37.4*   < > 32.1*   < > 33.6* 33.6* 34.1*  MCV 101.6*  --  101.6*  --   99.7 97.4 98.0  PLT 190   < > 255   < > 259 265 273   < > = values in this interval not displayed.   CBG: Recent Labs  Lab 10/18/19 1133 10/18/19 1558 10/18/19 2144 10/19/19 0611 10/19/19 1018  GLUCAP 118* 156* 104* 116* 137*    Lab Results  Component Value Date   INR 1.0 10/07/2019   INR 1.1 01/27/2019   Studies/Results: CT HEAD WO CONTRAST  Result Date: 10/18/2019 CLINICAL DATA:  Encephalopathy. Stent assisted angioplasty right ICA. EXAM: CT HEAD WITHOUT CONTRAST TECHNIQUE: Contiguous axial images were obtained from the base of the skull through the vertex without intravenous contrast. COMPARISON:  CT studies 10/07/2019.  MRI 10/07/2019. FINDINGS: Brain: No acute finding by CT. Low-density  in the cerebral hemispheric white matter could be consistent with chronic small vessel disease, but is known based on the previous MRI to relate at least in part to more recent deep white matter infarctions. No new abnormality. No hemorrhage mass-effect, hydrocephalus or extra-axial collection. Vascular: There is atherosclerotic calcification of the major vessels at the base of the brain. Skull: Negative Sinuses/Orbits: Clear/normal Other: None IMPRESSION: No acute finding by CT. Low-density in the cerebral hemispheric white matter appears similar to the previous CT studies and represents combination of chronic small vessel disease and more recent deep white matter infarctions. No sign of hemorrhage or mass effect. Electronically Signed   By: Nelson Chimes M.D.   On: 10/18/2019 21:51   DG CHEST PORT 1 VIEW  Result Date: 10/17/2019 CLINICAL DATA:  End-stage renal disease. EXAM: PORTABLE CHEST 1 VIEW COMPARISON:  October 07, 2019. FINDINGS: Stable cardiomediastinal silhouette. No pneumothorax is noted. Mild bibasilar atelectasis or infiltrates are noted. Right internal jugular catheter is noted with distal tip in expected position of the SVC. Bony thorax is unremarkable. IMPRESSION: Mild bibasilar  subsegmental atelectasis or infiltrates. Electronically Signed   By: Marijo Conception M.D.   On: 10/17/2019 13:18    Medications: . sodium chloride 0 mL/hr at 10/14/19 1819  . sodium chloride 10 mL/hr at 10/15/19 0754   . aspirin EC  81 mg Oral Daily  . atorvastatin  20 mg Oral q1800  . calcitRIOL  0.5 mcg Oral Q T,Th,Sa-HD  . Chlorhexidine Gluconate Cloth  6 each Topical Q0600  . clotrimazole   Topical BID  . feeding supplement (NEPRO CARB STEADY)  237 mL Oral TID WC  . heparin injection (subcutaneous)  5,000 Units Subcutaneous Q8H  . insulin aspart  0-15 Units Subcutaneous TID WC  . insulin aspart  0-5 Units Subcutaneous QHS  . levothyroxine  175 mcg Oral QAC breakfast  . lidocaine  1 application Urethral Once  . midodrine  10 mg Oral BID WC  . multivitamin  1 tablet Oral QHS  . polyethylene glycol  17 g Oral Daily  . QUEtiapine  12.5 mg Oral QHS  . sevelamer carbonate  2,400 mg Oral TID WC  . sodium chloride flush  10-40 mL Intracatheter Q12H  . ticagrelor  90 mg Oral BID    Dialysis:  TTS [facility]   4h  92kg   2/2.25 bath  LUA AVG  Hep 5000 Calcitriol0.16mcg PO qHD  Assessment/Plan: 1. 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 Per neuro. S/p ICA stent on 2/4, brilinta BID. Ok for heparin.   2. ESRD-HD TTS.Had  S/p temp cath 1/28 and then AVG declot 1/29.   Using AVG successfully x 2.  IV team consulted to remove [place] cath. HD tomorrow.  3. Hypotension/volume- Blood pressure variable on midodrine as OP.  Does not appear volume overloaded. Bed weights up a few kg 4. Anemiaof CKD- Hgb 11.2. No indication for ESA at this time. 5. Secondary Hyperparathyroidism -P elevated and binder resumed - renvela 3 TID AC - continue VDRA.   Ca in goal.   6. Nutrition- Renal diet w/fluid /mutivit - add nepro  7. DM- per  primary 8. Hypothyroidism - on levothyroxine 9. Dysphagia - on regular renal diet 10. UTI/urethritis:  On ciprofloxacin now. 11. Dispo: awaiting snf  Kelly Splinter, MD 10/19/2019, 11:12 AM

## 2019-10-19 NOTE — Progress Notes (Addendum)
PROGRESS NOTE    Frank [name]  [id number]  DOB: 1941/07/11  PCP: McLean-Scocuzza, Nino Glow, MD Admit date:10/07/2019  79 year old male with past medical history significant for ESRD on HD,DM, HTN, HLP, hypothyroidism, PAD, gout, depression presented to the ED on 1/27 with c/o weakness, facial droop, slurred speech. He was hypotensive and there was concern for CVA. Neurology was consulted. Patient was admitted to ICU with pressors. Stroke w/u revealed multiple scattered WM infarcts right more than left in watershed territories in the setting of hypoperfusion and right ICA high-grade stenosis.With improvement off pressors, patient was transferred out of ICU on 10/13/2019. Underwent cerebral arteriogram with right ICA revascularization/stent. Currently, plan is continue dual antiplatelet therapy while awaiting SNF bed. HC complicated by delirium, dyspnea and telemetry abnormalities prompting cardiology eval.   Subjective:  Patient resting in bed with head end elevated at 30 degrees.  He reports improvement in left-sided weakness.  States was able to work with PT somewhat today.  Speech appears to be improving as well.  He however is still requiring assistance with ADLs.  He is oriented x3 currently but according to the nurse does have.'s of confusion.   Objective: Vitals:   10/19/19 0044 10/19/19 0334 10/19/19 0755 10/19/19 1216  BP:  121/88 (!) 154/81 (!) 113/58  Pulse:  97 92 94  Resp:  19 17 18   Temp: 98.4 F (36.9 C) 97.8 F (36.6 C) 97.9 F (36.6 C) 97.9 F (36.6 C)  TempSrc: Oral Oral Oral Oral  SpO2:  97% 97% 97%  Weight:      Height:        Intake/Output Summary (Last 24 hours) at 10/19/2019 1603 Last data filed at 10/19/2019 0600 Gross per 24 hour  Intake 0 ml  Output 700 ml  Net -700 ml   Filed Weights   10/17/19 2050 10/18/19 0030 10/18/19 0203  Weight: 99 kg 98 kg 95.1 kg    Physical Examination:  General exam: Appears comfortable  Respiratory system: Clear  to auscultation. Respiratory effort normal. Cardiovascular system: S1 & S2 heard, RRR. No JVD, murmurs, rubs, gallops or clicks. No pedal edema. Gastrointestinal system: Abdomen is nondistended, soft and nontender. Normal bowel sounds heard. Central nervous system: Alert and oriented. No new focal neurological deficits. Extremities: No contractures, edema or joint deformities.  Skin: No rashes, lesions or ulcers Psychiatry: Judgement and insight appear normal. Mood & affect appropriate.   Data Reviewed: I have personally reviewed following labs and imaging studies  CBC: Recent Labs  Lab 10/13/19 0515 10/13/19 0515 10/14/19 0746 10/16/19 0500 10/17/19 1122 10/18/19 0446 10/19/19 0126  WBC 13.4*   < > 10.0 11.5* 8.9 9.9 11.0*  NEUTROABS 10.6*  --  7.2 9.7* 7.2  --   --   HGB 12.1*   < > 12.1* 10.2* 10.9* 11.2* 11.3*  HCT 37.8*   < > 37.4* 32.1* 33.6* 33.6* 34.1*  MCV 101.9*   < > 101.6* 101.6* 99.7 97.4 98.0  PLT 191   < > 190 255 259 265 273   < > = values in this interval not displayed.   Basic Metabolic Panel: Recent Labs  Lab 10/13/19 0515 10/13/19 0515 10/14/19 0746 10/14/19 0746 10/15/19 0424 10/16/19 0500 10/17/19 1122 10/18/19 0446 10/19/19 0126  NA 140   < > 140   < > 137 135 138 137 138  K 4.1   < > 3.8   < > 3.9 4.8 3.9 3.5 3.5  CL 100   < > 96*   < >  100 99 97* 97* 97*  CO2 22   < > 25   < > 26 18* 21* 25 26  GLUCOSE 119*   < > 106*   < > 105* 82 85 141* 124*  BUN 64*   < > 40*   < > 55* 65* 33* 17 24*  CREATININE 9.04*   < > 6.25*   < > 8.46* 9.02* 6.11* 4.12* 5.74*  CALCIUM 9.0   < > 9.2   < > 8.5* 8.7* 8.8* 8.8* 8.8*  MG  --   --   --   --   --   --   --  1.7  --   PHOS 6.9*  --  6.2*  --   --   --  5.7*  --   --    < > = values in this interval not displayed.   GFR: Estimated Creatinine Clearance: 11.8 mL/min (A) (by C-G formula based on SCr of 5.74 mg/dL (H)). Liver Function Tests: Recent Labs  Lab 10/13/19 0515 10/14/19 0746 10/17/19 1122    ALBUMIN 2.8* 2.8* 3.0*   No results for input(s): LIPASE, AMYLASE in the last 168 hours. Recent Labs  Lab 10/18/19 1908  AMMONIA 24   Coagulation Profile: No results for input(s): INR, PROTIME in the last 168 hours. Cardiac Enzymes: No results for input(s): CKTOTAL, CKMB, CKMBINDEX, TROPONINI in the last 168 hours. BNP (last 3 results) No results for input(s): PROBNP in the last 8760 hours. HbA1C: No results for input(s): HGBA1C in the last 72 hours. CBG: Recent Labs  Lab 10/18/19 1558 10/18/19 2144 10/19/19 0611 10/19/19 1018 10/19/19 1212  GLUCAP 156* 104* 116* 137* 164*   Lipid Profile: No results for input(s): CHOL, HDL, LDLCALC, TRIG, CHOLHDL, LDLDIRECT in the last 72 hours. Thyroid Function Tests: No results for input(s): TSH, T4TOTAL, FREET4, T3FREE, THYROIDAB in the last 72 hours. Anemia Panel: Recent Labs    10/18/19 1908  VITAMINB12 1,437*  FOLATE 37.0   Sepsis Labs: No results for input(s): PROCALCITON, LATICACIDVEN in the last 168 hours.  Recent Results (from the past 240 hour(s))  Surgical pcr screen     Status: None   Collection Time: 10/15/19  5:15 AM   Specimen: Nasal Mucosa; Nasal Swab  Result Value Ref Range Status   MRSA, PCR NEGATIVE NEGATIVE Final   Staphylococcus aureus NEGATIVE NEGATIVE Final    Comment: (NOTE) The Xpert SA Assay (FDA approved for NASAL specimens in patients 60 years of age and older), is one component of a comprehensive surveillance program. It is not intended to diagnose infection nor to guide or monitor treatment. Performed at [facility], [place] [address], [place], [place] [address]       Radiology Studies: CT HEAD WO CONTRAST  Result Date: 10/18/2019 CLINICAL DATA:  Encephalopathy. Stent assisted angioplasty right ICA. EXAM: CT HEAD WITHOUT CONTRAST TECHNIQUE: Contiguous axial images were obtained from the base of the skull through the vertex without intravenous contrast. COMPARISON:  CT studies  10/07/2019.  MRI 10/07/2019. FINDINGS: Brain: No acute finding by CT. Low-density in the cerebral hemispheric white matter could be consistent with chronic small vessel disease, but is known based on the previous MRI to relate at least in part to more recent deep white matter infarctions. No new abnormality. No hemorrhage mass-effect, hydrocephalus or extra-axial collection. Vascular: There is atherosclerotic calcification of the major vessels at the base of the brain. Skull: Negative Sinuses/Orbits: Clear/normal Other: None IMPRESSION: No acute finding by  CT. Low-density in the cerebral hemispheric white matter appears similar to the previous CT studies and represents combination of chronic small vessel disease and more recent deep white matter infarctions. No sign of hemorrhage or mass effect. Electronically Signed   By: Nelson Chimes M.D.   On: 10/18/2019 21:51        Scheduled Meds: . aspirin EC  81 mg Oral Daily  . atorvastatin  20 mg Oral q1800  . calcitRIOL  0.5 mcg Oral Q T,Th,Sa-HD  . Chlorhexidine Gluconate Cloth  6 each Topical Q0600  . Chlorhexidine Gluconate Cloth  6 each Topical Q0600  . clotrimazole   Topical BID  . feeding supplement (NEPRO CARB STEADY)  237 mL Oral TID WC  . heparin injection (subcutaneous)  5,000 Units Subcutaneous Q8H  . insulin aspart  0-15 Units Subcutaneous TID WC  . insulin aspart  0-5 Units Subcutaneous QHS  . levothyroxine  175 mcg Oral QAC breakfast  . lidocaine  1 application Urethral Once  . midodrine  10 mg Oral BID WC  . multivitamin  1 tablet Oral QHS  . polyethylene glycol  17 g Oral Daily  . QUEtiapine  12.5 mg Oral QHS  . sevelamer carbonate  2,400 mg Oral TID WC  . sodium chloride flush  10-40 mL Intracatheter Q12H  . ticagrelor  90 mg Oral BID   Continuous Infusions: . sodium chloride 0 mL/hr at 10/14/19 1819  . sodium chloride 10 mL/hr at 10/15/19 0754    Assessment & Plan:  1.  Multiple scattered WM infarcts right more than left  in watershed territories: in the setting of hypoperfusion and right ICA high-grade stenosis.S/P bilateral angiogram followed by stent assisted angioplasty of right ICA proximal with distal protection on 10/15/2019.Echocardiogram shows 60 to 65% EF.  No source of embolus.LDL 35 on Lipitor 20 mg only, due to low LDL.Hemoglobin A1c 6.7.Was on 81 mg aspirin PTA, now on aspirin and Brilinta.Neurology signed off on 10/10/2019.Follow-up with [facility] in 4 weeks.  Zyprexa as needed for associated metabolic encephalopathy/delirium (last QTC measured on EKG 2/6 was 452 ms).  2.  ESRD on HD TTS,Clotted AV Fistula-Successful declot of left AV fistula 10/09/2019.Temporary HD catheter placement 10/08/2019.Nephrology following.We will discuss with nephrology regarding removal of the temporary HD catheter now that the AV fistula appears to be  functioning.  3.  Chronic hypotension-Initially required low-dose Neo-Synephrine drip in the ICU.Currently on midodrine 10 mg 3 times daily.Blood pressure stable.  4.  Type 2 diabetes mellitus, uncontrolled with diabetic neuropathy Hemoglobin A1c 6.7.Continue sliding scale insulin.  5.  Hypothyroidism.Continue Synthroid  6.  UTI-Treated with Cipro.Cultures so far no growth.  7.  Hypokalemia.Replaced.  8.  Anemia of chronic kidney disease,Macrocytosis.Check [id number] and folic acid level.  9.  History of second-degree heart block. There was cocern for ST changes on telemetry.EKG showed evidence of accelerated junctional rhythm with LAFB(No significant change from prior EKGs).Patient has history of first-degree heart block with occasional Wenckebach phenomenon.  Now in the hospital and had some second-degree heart block. [place] cardiology consultation.  No indication for pacemaker placement for now.  10.  Shortness of breath.Chest x-ray on 2/6 showed mild bibasilar subsegmental atelectasis or infiltrates.  11.  Insomnia/Delirium:Discontinued Ambien, now on Seroquel  nightly. Check metabolic work-up as well as CT scan of the head. No focal deficit no asterixis on exam.  12.Obesity:Body mass index is 32.84 kg/m. Monitor.     DVT prophylaxis: Heparin Code Status: Full code Family / Patient Communication: Discussed with  patient and all questions answered.  His daughter apparently works here-will call and update. Disposition Plan: Pt is from home, admitted with acute stroke, still has weakness although improving, seen by PT for follow-up and continue to recommend SNF.  Will need rolling walker with 5 inch wheels, await SNF bed availability    LOS: 12 days    Time spent:     Guilford Shi, MD [facility] Pager in [place]  If 7PM-7AM, please contact night-coverage [email or web address] Password [facility] 10/19/2019, 4:03 PM

## 2019-10-19 NOTE — Progress Notes (Signed)
External male urinary catheter applied.

## 2019-10-19 NOTE — Progress Notes (Signed)
Physical Therapy Treatment Patient Details Name: Frank [name] MRN: [id number] DOB: [birth year] Today's Date: 10/19/2019    History of Present Illness 79 yo admitted with slurred speech and facial droop with multifocal watershed infarcts. PMhx: ESRD, DM, HTN, HLD, neuropathy, PAD, depression, gout    PT Comments    Pt with increased ambulation tolerance today however pt required max encouragement and demonstrated anxiety over fear of falling. Pt alert and oriented however demonstrated impaired sequencing, processing, insight to deficits and safety. Pt cont to be appropriate for SNF upon d/c to achieve safe mod I level of function for safe transition home.    Follow Up Recommendations  SNF;Supervision for mobility/OOB     Equipment Recommendations  Rolling walker with 5" wheels;3in1 (PT)    Recommendations for Other Services       Precautions / Restrictions Precautions Precautions: Fall Precaution Comments: pt very anxious re: falling Restrictions Weight Bearing Restrictions: No    Mobility  Bed Mobility Overal bed mobility: Needs Assistance Bed Mobility: Rolling;Sidelying to Sit     Supine to sit: Mod assist;HOB elevated     General bed mobility comments: max directional verbal cues to sequence task, modA for trunk elevation and increased time  Transfers Overall transfer level: Needs assistance Equipment used: Rolling walker (2 wheeled) Transfers: Sit to/from Stand Sit to Stand: Min assist;+2 safety/equipment;+2 physical assistance         General transfer comment: pt sit>stand x2, once from EOB and once from [facility] needing MAX cues for hand placement with little carryover despite hand over hand assist. MIN A +2 to power into standing  Ambulation/Gait Ambulation/Gait assistance: Min assist;+2 safety/equipment Gait Distance (Feet): 30 Feet(to door and back) Assistive device: Rolling walker (2 wheeled) Gait Pattern/deviations: Step-to pattern;Decreased step length -  right;Decreased step length - left;Decreased dorsiflexion - right;Decreased dorsiflexion - left;Shuffle;Trunk flexed Gait velocity: decreased   General Gait Details: pt with short shuffled steps, verbal cues to have pt lift up and clear his L foot, pt very slow and anxious re: falling   Chief Strategy Officer    Modified Rankin (Stroke Patients Only) Modified Rankin (Stroke Patients Only) Pre-Morbid Rankin Score: No symptoms Modified Rankin: Moderately severe disability     Balance Overall balance assessment: Needs assistance;History of Falls Sitting-balance support: No upper extremity supported;Feet supported Sitting balance-Leahy Scale: Fair Sitting balance - Comments: min guard- MIN A for sitting balance. able to attempt to don socks from EOB with close min guard Postural control: Posterior lean Standing balance support: No upper extremity supported;During functional activity;Single extremity supported Standing balance-Leahy Scale: Poor Standing balance comment: min a- min guard  for balance while standing at sink with OT                            Cognition Arousal/Alertness: Awake/alert Behavior During Therapy: Anxious;WFL for tasks assessed/performed(anxious with movement; fearful of falling) Overall Cognitive Status: Impaired/Different from baseline Area of Impairment: Attention;Problem solving;Safety/judgement;Following commands;Memory                   Current Attention Level: Sustained Memory: Decreased short-term memory Following Commands: Follows one step commands with increased time;Follows multi-step commands inconsistently Safety/Judgement: Decreased awareness of deficits;Decreased awareness of safety   Problem Solving: Slow processing;Requires verbal cues;Requires tactile cues General Comments: pt alert and oriented at start of session but then states he is at [place] at end  of session. pt very fearful of falling and  requires constant cues and hand over hand assist to attend to task and follow commands.      Exercises      General Comments General comments (skin integrity, edema, etc.): VSS      Pertinent Vitals/Pain Pain Assessment: Faces Faces Pain Scale: No hurt    Home Living                      Prior Function            PT Goals (current goals can now be found in the care plan section) Acute Rehab PT Goals Patient Stated Goal: return home Progress towards PT goals: Progressing toward goals    Frequency    Min 3X/week      PT Plan Current plan remains appropriate    Co-evaluation PT/OT/SLP Co-Evaluation/Treatment: Yes Reason for Co-Treatment: Complexity of the patient's impairments (multi-system involvement) PT goals addressed during session: Mobility/safety with mobility        AM-PAC PT "6 Clicks" Mobility   Outcome Measure  Help needed turning from your back to your side while in a flat bed without using bedrails?: A Little Help needed moving from lying on your back to sitting on the side of a flat bed without using bedrails?: A Little Help needed moving to and from a bed to a chair (including a wheelchair)?: A Little Help needed standing up from a chair using your arms (e.g., wheelchair or bedside chair)?: A Lot Help needed to walk in hospital room?: A Lot Help needed climbing 3-5 steps with a railing? : A Lot 6 Click Score: 15    End of Session Equipment Utilized During Treatment: Gait belt Activity Tolerance: Patient tolerated treatment well Patient left: in chair;with call bell/phone within reach;with chair alarm set;with nursing/sitter in room Nurse Communication: Mobility status;Other (comment)(nosebleed and white pill found in bed) PT Visit Diagnosis: Other abnormalities of gait and mobility (R26.89);Difficulty in walking, not elsewhere classified (R26.2)     Time: [id number] PT Time Calculation (min) (ACUTE ONLY): 31 min  Charges:  $Gait  Training: 8-22 mins                     Kittie Plater, PT, DPT Acute Rehabilitation Services Pager #: [phone] Office #: [phone]    Berline Lopes 10/19/2019, 2:45 PM

## 2019-10-19 NOTE — Progress Notes (Addendum)
Progress Note  Patient Name: Frank [name] Date of Encounter: 10/19/2019  Primary Cardiologist: Thompson Grayer, MD   Subjective   [name] was emotional this morning, as he mentions it was the first time he had looked at himself in the mirror since his hospitalization. He complains of some constipation this morning, but otherwise is feeling well. He is making good progress with PT. Denies chest pain, shortness of breath, light headedness with exertion.   Inpatient Medications    Scheduled Meds: . aspirin EC  81 mg Oral Daily  . atorvastatin  20 mg Oral q1800  . calcitRIOL  0.5 mcg Oral Q T,Th,Sa-HD  . Chlorhexidine Gluconate Cloth  6 each Topical Q0600  . clotrimazole   Topical BID  . feeding supplement (NEPRO CARB STEADY)  237 mL Oral TID WC  . heparin injection (subcutaneous)  5,000 Units Subcutaneous Q8H  . insulin aspart  0-15 Units Subcutaneous TID WC  . insulin aspart  0-5 Units Subcutaneous QHS  . levothyroxine  175 mcg Oral QAC breakfast  . lidocaine  1 application Urethral Once  . midodrine  10 mg Oral BID WC  . multivitamin  1 tablet Oral QHS  . QUEtiapine  12.5 mg Oral QHS  . sevelamer carbonate  2,400 mg Oral TID WC  . sodium chloride flush  10-40 mL Intracatheter Q12H  . ticagrelor  90 mg Oral BID   Continuous Infusions: . sodium chloride 0 mL/hr at 10/14/19 1819  . sodium chloride 10 mL/hr at 10/15/19 0754   PRN Meds: acetaminophen **OR** acetaminophen (TYLENOL) oral liquid 160 mg/5 mL **OR** acetaminophen, ketoconazole, oxybutynin, sodium chloride flush   Vital Signs    Vitals:   10/18/19 1937 10/19/19 0044 10/19/19 0334 10/19/19 0755  BP: (!) 153/63  121/88 (!) 154/81  Pulse: 88  97 92  Resp: 18  19 17   Temp: 98.2 F (36.8 C) 98.4 F (36.9 C) 97.8 F (36.6 C) 97.9 F (36.6 C)  TempSrc: Oral Oral Oral Oral  SpO2: 96%  97% 97%  Weight:      Height:        Intake/Output Summary (Last 24 hours) at 10/19/2019 0824 Last data filed at 10/19/2019  0600 Gross per 24 hour  Intake 200 ml  Output 700 ml  Net -500 ml   Last 3 Weights 10/18/2019 10/18/2019 10/17/2019  Weight (lbs) 209 lb 10.5 oz 216 lb 0.8 oz 218 lb 4.1 oz  Weight (kg) 95.1 kg 98 kg 99 kg      Telemetry    First degree AV block, occasional Wenkebach  - Personally Reviewed  ECG    No new tracings - Personally Reviewed  Physical Exam   GEN: No acute distress.   Neck: No JVD Cardiac: RRR, SEM; no rubs or gallops.  Respiratory: Clear to auscultation bilaterally. GI: Soft, nontender, non-distended  MS: No edema; No deformity. Neuro:  Nonfocal  Psych: Normal affect   Labs    High Sensitivity Troponin:   Recent Labs  Lab 10/17/19 1612 10/17/19 1816  TROPONINIHS 74* 77*      Chemistry Recent Labs  Lab 10/13/19 0515 10/13/19 0515 10/14/19 0746 10/15/19 0424 10/17/19 1122 10/18/19 0446 10/19/19 0126  NA 140   < > 140   < > 138 137 138  K 4.1   < > 3.8   < > 3.9 3.5 3.5  CL 100   < > 96*   < > 97* 97* 97*  CO2 22   < >  25   < > 21* 25 26  GLUCOSE 119*   < > 106*   < > 85 141* 124*  BUN 64*   < > 40*   < > 33* 17 24*  CREATININE 9.04*   < > 6.25*   < > 6.11* 4.12* 5.74*  CALCIUM 9.0   < > 9.2   < > 8.8* 8.8* 8.8*  ALBUMIN 2.8*  --  2.8*  --  3.0*  --   --   GFRNONAA 5*   < > 8*   < > 8* 13* 9*  GFRAA 6*   < > 9*   < > 9* 15* 10*  ANIONGAP 18*   < > 19*   < > 20* 15 15   < > = values in this interval not displayed.     Hematology Recent Labs  Lab 10/17/19 1122 10/18/19 0446 10/19/19 0126  WBC 8.9 9.9 11.0*  RBC 3.37* 3.45* 3.48*  HGB 10.9* 11.2* 11.3*  HCT 33.6* 33.6* 34.1*  MCV 99.7 97.4 98.0  MCH 32.3 32.5 32.5  MCHC 32.4 33.3 33.1  RDW 13.2 12.8 12.9  PLT 259 265 273    BNPNo results for input(s): BNP, PROBNP in the last 168 hours.   DDimer No results for input(s): DDIMER in the last 168 hours.   Radiology    CT HEAD WO CONTRAST  Result Date: 10/18/2019 CLINICAL DATA:  Encephalopathy. Stent assisted angioplasty right ICA.  EXAM: CT HEAD WITHOUT CONTRAST TECHNIQUE: Contiguous axial images were obtained from the base of the skull through the vertex without intravenous contrast. COMPARISON:  CT studies 10/07/2019.  MRI 10/07/2019. FINDINGS: Brain: No acute finding by CT. Low-density in the cerebral hemispheric white matter could be consistent with chronic small vessel disease, but is known based on the previous MRI to relate at least in part to more recent deep white matter infarctions. No new abnormality. No hemorrhage mass-effect, hydrocephalus or extra-axial collection. Vascular: There is atherosclerotic calcification of the major vessels at the base of the brain. Skull: Negative Sinuses/Orbits: Clear/normal Other: None IMPRESSION: No acute finding by CT. Low-density in the cerebral hemispheric white matter appears similar to the previous CT studies and represents combination of chronic small vessel disease and more recent deep white matter infarctions. No sign of hemorrhage or mass effect. Electronically Signed   By: Nelson Chimes M.D.   On: 10/18/2019 21:51   DG CHEST PORT 1 VIEW  Result Date: 10/17/2019 CLINICAL DATA:  End-stage renal disease. EXAM: PORTABLE CHEST 1 VIEW COMPARISON:  October 07, 2019. FINDINGS: Stable cardiomediastinal silhouette. No pneumothorax is noted. Mild bibasilar atelectasis or infiltrates are noted. Right internal jugular catheter is noted with distal tip in expected position of the SVC. Bony thorax is unremarkable. IMPRESSION: Mild bibasilar subsegmental atelectasis or infiltrates. Electronically Signed   By: Marijo Conception M.D.   On: 10/17/2019 13:18    Cardiac Studies   TTE 10/07/19:  1. Left ventricular ejection fraction, by visual estimation, is 60 to  65%. The left ventricle has normal function. There is moderately increased  left ventricular hypertrophy.  2. Left ventricular diastolic parameters are indeterminate.  3. The left ventricle has no regional wall motion abnormalities.    4. Global right ventricle has mildly reduced systolic function.The right  ventricular size is normal. No increase in right ventricular wall  thickness.  5. Left atrial size was normal.  6. Right atrial size was normal.  7. The mitral valve is normal in  structure. Trivial mitral valve  regurgitation.  8. The tricuspid valve is normal in structure.  9. The tricuspid valve is normal in structure. Tricuspid valve  regurgitation is trivial.  10. The aortic valve is tricuspid. Aortic valve regurgitation is not  visualized. Mild to moderate aortic valve sclerosis/calcification without  any evidence of aortic stenosis.  11. The pulmonic valve was grossly normal. Pulmonic valve regurgitation is  not visualized.  12. Moderately elevated pulmonary artery systolic pressure.  13. No intracardiac source of embolism detected on this transthoracic  study. A transesophageal echocardiogram is recommended to exclude cardiac  source of embolism if clinically indicated.  14. The atrial septum is grossly normal.   Patient Profile     79 y.o. 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  Agree with documentation per Dr Koleen Distance.   On exam, patient is alert and orientedx3, regular rate and rhythm, no murmurs, lungs CTAB, no LE edema or JVD.  Telemetry reviewed, continues to have Wenckebach but no bradycardia or higher  degree AV block appreciated.   No indication for pacemaker at this time.  Donato Heinz, MD

## 2019-10-20 LAB — GLUCOSE, CAPILLARY
Glucose-Capillary: 137 mg/dL — ABNORMAL HIGH (ref 70–99)
Glucose-Capillary: 115 mg/dL — ABNORMAL HIGH (ref 70–99)
Glucose-Capillary: 106 mg/dL — ABNORMAL HIGH (ref 70–99)
Glucose-Capillary: 191 mg/dL — ABNORMAL HIGH (ref 70–99)

## 2019-10-20 LAB — CBC
HCT: 32.5 % — ABNORMAL LOW (ref 39.0–52.0)
Hemoglobin: 10.5 g/dL — ABNORMAL LOW (ref 13.0–17.0)
MCH: 32.3 pg (ref 26.0–34.0)
MCHC: 32.3 g/dL (ref 30.0–36.0)
MCV: 100 fL (ref 80.0–100.0)
Platelets: 290 10*3/uL (ref 150–400)
RBC: 3.25 MIL/uL — ABNORMAL LOW (ref 4.22–5.81)
RDW: 13.1 % (ref 11.5–15.5)
WBC: 12 10*3/uL — ABNORMAL HIGH (ref 4.0–10.5)
nRBC: 0 % (ref 0.0–0.2)

## 2019-10-20 LAB — BASIC METABOLIC PANEL
Anion gap: 10 (ref 5–15)
BUN: 40 mg/dL — ABNORMAL HIGH (ref 8–23)
CO2: 27 mmol/L (ref 22–32)
Calcium: 8.7 mg/dL — ABNORMAL LOW (ref 8.9–10.3)
Chloride: 101 mmol/L (ref 98–111)
Creatinine, Ser: 7.62 mg/dL — ABNORMAL HIGH (ref 0.61–1.24)
GFR calc Af Amer: 7 mL/min — ABNORMAL LOW (ref >60)
GFR calc non Af Amer: 6 mL/min — ABNORMAL LOW (ref >60)
Glucose, Bld: 126 mg/dL — ABNORMAL HIGH (ref 70–99)
Potassium: 3.1 mmol/L — ABNORMAL LOW (ref 3.5–5.1)
Sodium: 138 mmol/L (ref 135–145)

## 2019-10-20 MED ORDER — FINASTERIDE 5 MG PO TABS
5.0000 mg | ORAL_TABLET | Freq: Every day | ORAL | Status: DC
  Administered 2019-10-20: 5 mg via ORAL
  Filled 2019-10-20: qty 1

## 2019-10-20 MED ORDER — ACETAMINOPHEN 325 MG PO TABS
ORAL_TABLET | ORAL | Status: AC
  Filled 2019-10-20: qty 2

## 2019-10-20 MED ORDER — HEPARIN SODIUM (PORCINE) 1000 UNIT/ML IJ SOLN
INTRAMUSCULAR | Status: AC
  Administered 2019-10-20: 1000 [IU]
  Filled 2019-10-20: qty 5

## 2019-10-20 NOTE — Progress Notes (Signed)
  [facility] Progress Note   Subjective:   Pt seen on HD, no new c/o's.  Not able to pull much on HD today  Objective Vitals:   10/20/19 1200 10/20/19 1300 10/20/19 1315 10/20/19 1330  BP: 108/65 96/66 (!) 92/54 90/72  Pulse: (!) 105 (!) 110 (!) 104 78  Resp:      Temp:      TempSrc:      SpO2:      Weight:      Height:       Physical Exam General:NAD, WDWN Heart:RRR Lungs:CTAB, nml WOB Abdomen:soft, NTND Extremities:no LE edema Dialysis Access: LU AVG +b/t, HD cath R IJ   Dialysis:  TTS [facility]   4h  92kg   2/2.25 bath  LUA AVG  Hep 5000 Calcitriol0.39mcg PO qHD  Assessment/Plan: 1. 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 Per neuro. S/p ICA stent on 2/4, brilinta BID. Ok for heparin.   2. ESRD-HD TTS.Had  S/p temp cath 1/28 and AVG declot 1/29.   Using AVG successfully now, temp cath dc'd. HD today.  3. Hypotension/volume- Blood pressure variable on midodrine as OP.  Does not appear volume overloaded. Bed weights not accurate or has gained body wt 4. Anemiaof CKD- Hgb 11.2. No indication for ESA at this time. 5. Secondary Hyperparathyroidism -P elevated and binder resumed - renvela 3 TID AC - continue VDRA.   Ca in goal.   6. Nutrition- Renal diet w/fluid /mutivit - add nepro  7. DM- per primary 8. Hypothyroidism - on levothyroxine 9. Dysphagia - on regular renal diet 10. UTI/urethritis:  On ciprofloxacin now. 11. Dispo: awaiting snf  Kelly Splinter, MD 10/20/2019, 2:10 PM      Liver Function Tests: Medications: . sodium chloride Stopped (10/20/19 0400)  . sodium chloride Stopped (10/20/19 0400)   . acetaminophen      . aspirin EC  81 mg Oral Daily  . atorvastatin  20 mg Oral q1800  . calcitRIOL  0.5 mcg Oral Q T,Th,Sa-HD  . Chlorhexidine Gluconate Cloth  6 each Topical Q0600  . Chlorhexidine  Gluconate Cloth  6 each Topical Q0600  . clotrimazole   Topical BID  . feeding supplement (NEPRO CARB STEADY)  237 mL Oral TID WC  . heparin      . heparin injection (subcutaneous)  5,000 Units Subcutaneous Q8H  . insulin aspart  0-15 Units Subcutaneous TID WC  . insulin aspart  0-5 Units Subcutaneous QHS  . levothyroxine  175 mcg Oral QAC breakfast  . lidocaine  1 application Urethral Once  . midodrine  10 mg Oral BID WC  . multivitamin  1 tablet Oral QHS  . polyethylene glycol  17 g Oral Daily  . QUEtiapine  12.5 mg Oral QHS  . sevelamer carbonate  2,400 mg Oral TID WC  . sodium chloride flush  10-40 mL Intracatheter Q12H  . ticagrelor  90 mg Oral BID

## 2019-10-20 NOTE — Progress Notes (Signed)
Telemetry reviewed.  Continue to have episodes of Wenkebach but no bradycardia or higher degree block appreciated.  No indication for PPM at this time, will plan outpatient EP follow-up.  [facility] will sign off.   Medication Recommendations:  No changes Other recommendations (labs, testing, etc):  None Follow up as an outpatient:  Appointment with Tommye Standard, PA-C scheduled for 11/02/19

## 2019-10-20 NOTE — Progress Notes (Addendum)
PROGRESS NOTE    Frank [name]  [id number]  DOB: 1941-03-10  PCP: McLean-Scocuzza, Nino Glow, MD Admit date:10/07/2019  79 year old male with past medical history significant for ESRD on HD,DM, HTN, HLP, hypothyroidism, PAD, gout, depression presented to the ED on 1/27 with c/o weakness, facial droop, slurred speech. He was hypotensive and there was concern for CVA. Neurology was consulted. Patient was admitted to ICU with pressors. Stroke w/u revealed multiple scattered WM infarcts right more than left in watershed territories in the setting of hypoperfusion and right ICA high-grade stenosis.With improvement off pressors, patient was transferred out of ICU on 10/13/2019. Underwent cerebral arteriogram with right ICA revascularization/stent. Currently, plan is continue dual antiplatelet therapy while awaiting SNF bed. HC complicated by delirium, dyspnea and telemetry abnormalities prompting cardiology eval.   Subjective:  Patient appears more awake and conversing well today.  He reports improvement in left-sided weakness.  Speech appears to be improving as well.  He is oriented x3 currently but according to the nurse does have periods of confusion. Seen by cardiology. Now on indwelling Foley   Objective: Vitals:   10/20/19 1315 10/20/19 1330 10/20/19 1400 10/20/19 1430  BP: (!) 92/54 90/72 107/65 (P) 108/68  Pulse: (!) 104 78 98 (P) 93  Resp:      Temp:      TempSrc:      SpO2:      Weight:      Height:        Intake/Output Summary (Last 24 hours) at 10/20/2019 1501 Last data filed at 10/20/2019 0527 Gross per 24 hour  Intake 300 ml  Output 425 ml  Net -125 ml   Filed Weights   10/18/19 0203 10/20/19 0112 10/20/19 1135  Weight: 95.1 kg 97.2 kg 94.8 kg    Physical Examination:  General exam: Appears comfortable  Respiratory system: Clear to auscultation. Respiratory effort normal. Cardiovascular system: S1 & S2 heard, RRR. No JVD, murmurs, rubs, gallops or clicks. No pedal  edema. Gastrointestinal system: Abdomen is nondistended, soft and nontender. Normal bowel sounds heard. Central nervous system: Alert and oriented. No new focal neurological deficits. Extremities: No contractures, edema or joint deformities.  Skin: mild bruising/ecchymosis along LE , small subcut blood collection  rt popliteal/lower thigh area  Psychiatry: Judgement and insight appear normal. Mood & affect appropriate.   Data Reviewed: I have personally reviewed following labs and imaging studies  CBC: Recent Labs  Lab 10/14/19 0746 10/14/19 0746 10/16/19 0500 10/17/19 1122 10/18/19 0446 10/19/19 0126 10/20/19 0557  WBC 10.0   < > 11.5* 8.9 9.9 11.0* 12.0*  NEUTROABS 7.2  --  9.7* 7.2  --   --   --   HGB 12.1*   < > 10.2* 10.9* 11.2* 11.3* 10.5*  HCT 37.4*   < > 32.1* 33.6* 33.6* 34.1* 32.5*  MCV 101.6*   < > 101.6* 99.7 97.4 98.0 100.0  PLT 190   < > 255 259 265 273 290   < > = values in this interval not displayed.   Basic Metabolic Panel: Recent Labs  Lab 10/14/19 0746 10/15/19 0424 10/16/19 0500 10/17/19 1122 10/18/19 0446 10/19/19 0126 10/20/19 0557  NA 140   < > 135 138 137 138 138  K 3.8   < > 4.8 3.9 3.5 3.5 3.1*  CL 96*   < > 99 97* 97* 97* 101  CO2 25   < > 18* 21* 25 26 27   GLUCOSE 106*   < > 82 85 141*  124* 126*  BUN 40*   < > 65* 33* 17 24* 40*  CREATININE 6.25*   < > 9.02* 6.11* 4.12* 5.74* 7.62*  CALCIUM 9.2   < > 8.7* 8.8* 8.8* 8.8* 8.7*  MG  --   --   --   --  1.7  --   --   PHOS 6.2*  --   --  5.7*  --   --   --    < > = values in this interval not displayed.   GFR: Estimated Creatinine Clearance: 8.9 mL/min (A) (by C-G formula based on SCr of 7.62 mg/dL (H)). Liver Function Tests: Recent Labs  Lab 10/14/19 0746 10/17/19 1122  ALBUMIN 2.8* 3.0*   No results for input(s): LIPASE, AMYLASE in the last 168 hours. Recent Labs  Lab 10/18/19 1908  AMMONIA 24   Coagulation Profile: No results for input(s): INR, PROTIME in the last 168  hours. Cardiac Enzymes: No results for input(s): CKTOTAL, CKMB, CKMBINDEX, TROPONINI in the last 168 hours. BNP (last 3 results) No results for input(s): PROBNP in the last 8760 hours. HbA1C: No results for input(s): HGBA1C in the last 72 hours. CBG: Recent Labs  Lab 10/19/19 1018 10/19/19 1212 10/19/19 1638 10/19/19 2151 10/20/19 0553  GLUCAP 137* 164* 86 137* 115*   Lipid Profile: No results for input(s): CHOL, HDL, LDLCALC, TRIG, CHOLHDL, LDLDIRECT in the last 72 hours. Thyroid Function Tests: No results for input(s): TSH, T4TOTAL, FREET4, T3FREE, THYROIDAB in the last 72 hours. Anemia Panel: Recent Labs    10/18/19 1908  VITAMINB12 1,437*  FOLATE 37.0   Sepsis Labs: No results for input(s): PROCALCITON, LATICACIDVEN in the last 168 hours.  Recent Results (from the past 240 hour(s))  Surgical pcr screen     Status: None   Collection Time: 10/15/19  5:15 AM   Specimen: Nasal Mucosa; Nasal Swab  Result Value Ref Range Status   MRSA, PCR NEGATIVE NEGATIVE Final   Staphylococcus aureus NEGATIVE NEGATIVE Final    Comment: (NOTE) The Xpert SA Assay (FDA approved for NASAL specimens in patients 16 years of age and older), is one component of a comprehensive surveillance program. It is not intended to diagnose infection nor to guide or monitor treatment. Performed at [facility], [place] [address], [place], [place] [address]       Radiology Studies: CT HEAD WO CONTRAST  Result Date: 10/18/2019 CLINICAL DATA:  Encephalopathy. Stent assisted angioplasty right ICA. EXAM: CT HEAD WITHOUT CONTRAST TECHNIQUE: Contiguous axial images were obtained from the base of the skull through the vertex without intravenous contrast. COMPARISON:  CT studies 10/07/2019.  MRI 10/07/2019. FINDINGS: Brain: No acute finding by CT. Low-density in the cerebral hemispheric white matter could be consistent with chronic small vessel disease, but is known based on the previous MRI to relate at  least in part to more recent deep white matter infarctions. No new abnormality. No hemorrhage mass-effect, hydrocephalus or extra-axial collection. Vascular: There is atherosclerotic calcification of the major vessels at the base of the brain. Skull: Negative Sinuses/Orbits: Clear/normal Other: None IMPRESSION: No acute finding by CT. Low-density in the cerebral hemispheric white matter appears similar to the previous CT studies and represents combination of chronic small vessel disease and more recent deep white matter infarctions. No sign of hemorrhage or mass effect. Electronically Signed   By: Nelson Chimes M.D.   On: 10/18/2019 21:51        Scheduled Meds: . acetaminophen      .  aspirin EC  81 mg Oral Daily  . atorvastatin  20 mg Oral q1800  . calcitRIOL  0.5 mcg Oral Q T,Th,Sa-HD  . Chlorhexidine Gluconate Cloth  6 each Topical Q0600  . Chlorhexidine Gluconate Cloth  6 each Topical Q0600  . clotrimazole   Topical BID  . feeding supplement (NEPRO CARB STEADY)  237 mL Oral TID WC  . heparin      . heparin injection (subcutaneous)  5,000 Units Subcutaneous Q8H  . insulin aspart  0-15 Units Subcutaneous TID WC  . insulin aspart  0-5 Units Subcutaneous QHS  . levothyroxine  175 mcg Oral QAC breakfast  . lidocaine  1 application Urethral Once  . midodrine  10 mg Oral BID WC  . multivitamin  1 tablet Oral QHS  . polyethylene glycol  17 g Oral Daily  . QUEtiapine  12.5 mg Oral QHS  . sevelamer carbonate  2,400 mg Oral TID WC  . sodium chloride flush  10-40 mL Intracatheter Q12H  . ticagrelor  90 mg Oral BID   Continuous Infusions: . sodium chloride Stopped (10/20/19 0400)  . sodium chloride Stopped (10/20/19 0400)    Assessment & Plan:  1.  Multiple scattered WM infarcts right more than left in watershed territories: in the setting of hypoperfusion and right ICA high-grade stenosis.S/P bilateral angiogram followed by stent assisted angioplasty of right ICA proximal with distal  protection on 10/15/2019.Echocardiogram shows 60 to 65% EF.  No source of embolus.LDL 35 on Lipitor 20 mg only, due to low LDL.Hemoglobin A1c 6.7.Was on 81 mg aspirin PTA, now on aspirin and Brilinta.Neurology signed off on 10/10/2019.Follow-up with [facility] in 4 weeks.  Zyprexa/seroquel as needed for associated metabolic encephalopathy/delirium (last QTC measured on EKG 2/6 was 452 ms). F/U EKG/Qtc in am  2.  ESRD on HD TTS,Clotted AV Fistula-Successful declot of left AV fistula 10/09/2019.Temporary HD catheter placement 10/08/2019.Nephrology following.We will discuss with nephrology regarding removal of the temporary HD catheter now that the AV fistula appears to be  functioning.  3.  Chronic hypotension-Initially required low-dose Neo-Synephrine drip in the ICU. Was on midodrine 10 mg 3 times daily--reduced now to BID.Blood pressure stable.  4.  History of second-degree heart block.Patient has history of first-degree heart block with occasional Wenckebach phenomenon.  Now in the hospital and had some second-degree heart blocks and ST changes noted on telemetry.Seen by cardiology. 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 No indication for pacemaker placement for now.Recommended outpatient EP f/u  5.  Shortness of breath.Chest x-ray on 2/6 showed mild bibasilar subsegmental atelectasis or infiltrates.No evidence of fever or white count. Saturating well on RA. Will try IS and see if his symptoms improve  6.  Insomnia/Delirium:Discontinued Ambien, now on Seroquel nightly. Repeat CT scan of the head unremarkable on 2/7.Improving slowly  7.  Hypothyroidism.Continue Synthroid  8.  Urinary retention: Patient has h/o BPH. Flomax discontinued in ICU in concern for hypotension. Failed voiding trial yesterday --placed foley back in. Will add finasteride and monitor BP--may need to consult urology and or increase midodrine dose if persistent  issues.Treated with Cipro empirically for IR procedure per daughter.Urine cultures so far no growth, UTI ruled out.  9.  Type 2 diabetes mellitus, uncontrolled with diabetic neuropathy. Hemoglobin A1c 6.7.Continue sliding scale insulin.  10.  Anemia of chronic kidney disease,Macrocytosis.Check 123456 and folic acid level.  11. Obesity:Body mass index is 32.84 kg/m. Monitor.     DVT prophylaxis: Heparin, d/c SCD as  daughter concerned about falls and pptating ecchymosis  Code Status: Full code Family / Patient Communication: Discussed with patient and all questions answered.  His daughter works as Lawyer and updated today Disposition Plan: Pt is from home, admitted with acute stroke, still has weakness, periods of confusion although improving, seen by PT for follow-up and continue to recommend SNF.  Will need rolling walker with 5 inch wheels, await SNF bed availability    LOS: 13 days    Time spent: 25 minutes    Guilford Shi, MD [facility] Pager in [place]  If 7PM-7AM, please contact night-coverage [email or web address] Password [email or web address] 10/20/2019, 3:01 PM

## 2019-10-20 NOTE — Progress Notes (Signed)
Physical Therapy Treatment Patient Details Name: Frank [name] MRN: [id number] DOB: 09/22/1940 Today's Date: 10/20/2019    History of Present Illness 79 yo admitted with slurred speech and facial droop with multifocal watershed infarcts. PMhx: ESRD, DM, HTN, HLD, neuropathy, PAD, depression, gout    PT Comments    Pt performed gt training and functional mobility during session.  He remains very anxious of falling.  He performed gt training with limited distance due to weakness and deconditioning.  Plan remains appropriate for SNF placement.      Follow Up Recommendations  SNF;Supervision for mobility/OOB     Equipment Recommendations  Rolling walker with 5" wheels;3in1 (PT)    Recommendations for Other Services       Precautions / Restrictions Precautions Precautions: Fall Precaution Comments: pt very anxious re: falling Restrictions Weight Bearing Restrictions: No    Mobility  Bed Mobility Overal bed mobility: Needs Assistance Bed Mobility: Sidelying to Sit;Rolling Rolling: Min assist Sidelying to sit: Mod assist       General bed mobility comments: Pt performed sidelying to sit with moderated assistance to advance LEs and elevate trunk into a seated position.  Once in sitting moderate assistance to move to edge of bed.  Transfers Overall transfer level: Needs assistance Equipment used: Rolling walker (2 wheeled) Transfers: Sit to/from Stand Sit to Stand: Min assist;Mod assist         General transfer comment: Pt required min from elevated surface of bed and moderate assistance from commode.  Ambulation/Gait Ambulation/Gait assistance: Min assist;+2 safety/equipment Gait Distance (Feet): 15 Feet(x2 to and from bathroom.) Assistive device: Rolling walker (2 wheeled) Gait Pattern/deviations: Step-to pattern;Decreased step length - right;Decreased step length - left;Decreased dorsiflexion - right;Decreased dorsiflexion - left;Shuffle;Trunk flexed Gait velocity:  decreased   General Gait Details: Continues to present with decreased strength on L side.   Stairs             Wheelchair Mobility    Modified Rankin (Stroke Patients Only)       Balance Overall balance assessment: Needs assistance;History of Falls Sitting-balance support: No upper extremity supported;Feet supported Sitting balance-Leahy Scale: Fair Sitting balance - Comments: min guard- MIN A for sitting balance. able to attempt to don socks from EOB with close min guard       Standing balance comment: min a- min guard  for balance while standing at sink with OT                            Cognition Arousal/Alertness: Awake/alert Behavior During Therapy: Anxious;WFL for tasks assessed/performed Overall Cognitive Status: Impaired/Different from baseline Area of Impairment: Attention;Problem solving;Safety/judgement;Following commands;Memory                   Current Attention Level: Sustained Memory: Decreased short-term memory Following Commands: Follows one step commands with increased time;Follows multi-step commands inconsistently Safety/Judgement: Decreased awareness of deficits;Decreased awareness of safety   Problem Solving: Slow processing;Requires verbal cues;Requires tactile cues General Comments: Pt remains tangential with conversation.  He remains very anxious during session.      Exercises      General Comments        Pertinent Vitals/Pain Pain Assessment: Faces Faces Pain Scale: No hurt Pain Location: general discomfort when reaching to feet to attempt donning socks Pain Descriptors / Indicators: Discomfort Pain Intervention(s): Monitored during session;Repositioned    Home Living  Prior Function            PT Goals (current goals can now be found in the care plan section) Acute Rehab PT Goals Patient Stated Goal: return home Potential to Achieve Goals: Fair Progress towards PT goals:  Progressing toward goals    Frequency    Min 3X/week      PT Plan Current plan remains appropriate    Co-evaluation              AM-PAC PT "6 Clicks" Mobility   Outcome Measure  Help needed turning from your back to your side while in a flat bed without using bedrails?: A Little Help needed moving from lying on your back to sitting on the side of a flat bed without using bedrails?: A Lot Help needed moving to and from a bed to a chair (including a wheelchair)?: A Lot Help needed standing up from a chair using your arms (e.g., wheelchair or bedside chair)?: A Lot Help needed to walk in hospital room?: A Lot Help needed climbing 3-5 steps with a railing? : Total 6 Click Score: 12    End of Session Equipment Utilized During Treatment: Gait belt Activity Tolerance: Patient tolerated treatment well Patient left: in chair;with call bell/phone within reach;with chair alarm set;with nursing/sitter in room Nurse Communication: Mobility status;Other (comment) PT Visit Diagnosis: Other abnormalities of gait and mobility (R26.89);Difficulty in walking, not elsewhere classified (R26.2)     Time: [id number] PT Time Calculation (min) (ACUTE ONLY): 34 min  Charges:  $Gait Training: 8-22 mins $Therapeutic Activity: 8-22 mins                     Erasmo Leventhal , PTA Acute Rehabilitation Services Pager [phone] Office [phone]     Adelia Baptista Eli Hose 10/20/2019, 6:18 PM

## 2019-10-21 ENCOUNTER — Inpatient Hospital Stay (HOSPITAL_COMMUNITY): Payer: Medicare Other

## 2019-10-21 DIAGNOSIS — I441 Atrioventricular block, second degree: Secondary | ICD-10-CM

## 2019-10-21 LAB — CBC
HCT: 34 % — ABNORMAL LOW (ref 39.0–52.0)
Hemoglobin: 10.8 g/dL — ABNORMAL LOW (ref 13.0–17.0)
MCH: 32.2 pg (ref 26.0–34.0)
MCHC: 31.8 g/dL (ref 30.0–36.0)
MCV: 101.5 fL — ABNORMAL HIGH (ref 80.0–100.0)
Platelets: 330 10*3/uL (ref 150–400)
RBC: 3.35 MIL/uL — ABNORMAL LOW (ref 4.22–5.81)
RDW: 13.2 % (ref 11.5–15.5)
WBC: 14.7 10*3/uL — ABNORMAL HIGH (ref 4.0–10.5)
nRBC: 0 % (ref 0.0–0.2)

## 2019-10-21 LAB — BASIC METABOLIC PANEL
Anion gap: 10 (ref 5–15)
Anion gap: 16 — ABNORMAL HIGH (ref 5–15)
BUN: 23 mg/dL (ref 8–23)
BUN: 31 mg/dL — ABNORMAL HIGH (ref 8–23)
CO2: 29 mmol/L (ref 22–32)
CO2: 28 mmol/L (ref 22–32)
Calcium: 8.9 mg/dL (ref 8.9–10.3)
Calcium: 9.5 mg/dL (ref 8.9–10.3)
Chloride: 98 mmol/L (ref 98–111)
Chloride: 96 mmol/L — ABNORMAL LOW (ref 98–111)
Creatinine, Ser: 5.11 mg/dL — ABNORMAL HIGH (ref 0.61–1.24)
Creatinine, Ser: 6.08 mg/dL — ABNORMAL HIGH (ref 0.61–1.24)
GFR calc Af Amer: 12 mL/min — ABNORMAL LOW (ref >60)
GFR calc Af Amer: 9 mL/min — ABNORMAL LOW (ref >60)
GFR calc non Af Amer: 10 mL/min — ABNORMAL LOW (ref >60)
GFR calc non Af Amer: 8 mL/min — ABNORMAL LOW (ref >60)
Glucose, Bld: 127 mg/dL — ABNORMAL HIGH (ref 70–99)
Glucose, Bld: 150 mg/dL — ABNORMAL HIGH (ref 70–99)
Potassium: 3.3 mmol/L — ABNORMAL LOW (ref 3.5–5.1)
Potassium: 3.9 mmol/L (ref 3.5–5.1)
Sodium: 137 mmol/L (ref 135–145)
Sodium: 140 mmol/L (ref 135–145)

## 2019-10-21 LAB — URINALYSIS, ROUTINE W REFLEX MICROSCOPIC
Bilirubin Urine: NEGATIVE
Glucose, UA: 150 mg/dL — AB
Ketones, ur: NEGATIVE mg/dL
Leukocytes,Ua: NEGATIVE
Nitrite: NEGATIVE
Protein, ur: 100 mg/dL — AB
Specific Gravity, Urine: 1.006 (ref 1.005–1.030)
pH: 8 (ref 5.0–8.0)

## 2019-10-21 LAB — MAGNESIUM: Magnesium: 1.9 mg/dL (ref 1.7–2.4)

## 2019-10-21 LAB — GLUCOSE, CAPILLARY: Glucose-Capillary: 115 mg/dL — ABNORMAL HIGH (ref 70–99)

## 2019-10-21 MED ORDER — SODIUM CHLORIDE 0.9 % IV SOLN
1.0000 g | INTRAVENOUS | Status: DC
  Administered 2019-10-21 – 2019-10-24 (×4): 1 g via INTRAVENOUS
  Filled 2019-10-21 (×5): qty 10

## 2019-10-21 MED ORDER — TAMSULOSIN HCL 0.4 MG PO CAPS
0.4000 mg | ORAL_CAPSULE | Freq: Every day | ORAL | Status: DC
  Administered 2019-10-21 – 2019-10-24 (×4): 0.4 mg via ORAL
  Filled 2019-10-21 (×4): qty 1

## 2019-10-21 MED ORDER — MIDODRINE HCL 5 MG PO TABS
10.0000 mg | ORAL_TABLET | Freq: Three times a day (TID) | ORAL | Status: DC
  Administered 2019-10-21 – 2019-10-24 (×9): 10 mg via ORAL
  Filled 2019-10-21 (×9): qty 2

## 2019-10-21 MED ORDER — POTASSIUM CHLORIDE 20 MEQ PO PACK
20.0000 meq | PACK | Freq: Three times a day (TID) | ORAL | Status: AC
  Administered 2019-10-21 (×2): 20 meq via ORAL
  Filled 2019-10-21 (×2): qty 1

## 2019-10-21 MED ORDER — MAGNESIUM SULFATE 2 GM/50ML IV SOLN
2.0000 g | Freq: Once | INTRAVENOUS | Status: AC
  Administered 2019-10-21: 2 g via INTRAVENOUS
  Filled 2019-10-21: qty 50

## 2019-10-21 MED ORDER — CHLORHEXIDINE GLUCONATE CLOTH 2 % EX PADS
6.0000 | MEDICATED_PAD | Freq: Every day | CUTANEOUS | Status: DC
  Administered 2019-10-21 – 2019-10-24 (×2): 6 via TOPICAL

## 2019-10-21 NOTE — TOC Progression Note (Signed)
Transition of Care [facility]) - Progression Note    Patient Details  Name: Frank [name] MRN: [id number] Date of Birth: 06/01/1941  Transition of Care [facility]) CM/SW [place], [place] Phone Number: 10/21/2019, 3:01 PM  Clinical Narrative:   CSW following for discharge plan. CSW checked in with [facility] to see about patient's referral, and if they could provide transport for patient's home dialysis center in [place]. [facility] able to offer a bed for patient and can provide transport, no need to move dialysis locations. CSW contacted patient's daughter, [name], to update her that [facility] has offered a bed and she is appreciative as they are familiar with Isaias Cowman from another family member. [facility] will have a bed for patient when he is stable for discharge. CSW to follow.    Expected Discharge Plan: [place] Barriers to Discharge: Continued Medical Work up  Expected Discharge Plan and Services Expected Discharge Plan: [place] Choice: [place] arrangements for the past 2 months: Single Family Home                                       Social Determinants of Health (SDOH) Interventions    Readmission Risk Interventions No flowsheet data found.

## 2019-10-21 NOTE — Plan of Care (Signed)
OOB TO CHAIR WITH [facility]. TELE STAFF HAS NOTIFIED UNIT OF MULTIPLE BRADYCARDIAS TODAY, WITH NO OBVIOUS DISTRESS THROUGHOUT. INFORMED ATTENDING MD OF NOTIFICATIONS.

## 2019-10-21 NOTE — Progress Notes (Signed)
Progress Note  Patient Name: Frank [name] Date of Encounter: 10/21/2019  Primary Cardiologist: Thompson Grayer, MD   Subjective   Denies any lightheadedness or syncope.  Does report dyspnea with exertion  Inpatient Medications    Scheduled Meds: . aspirin EC  81 mg Oral Daily  . atorvastatin  20 mg Oral q1800  . calcitRIOL  0.5 mcg Oral Q T,Th,Sa-HD  . Chlorhexidine Gluconate Cloth  6 each Topical Q0600  . Chlorhexidine Gluconate Cloth  6 each Topical Q0600  . Chlorhexidine Gluconate Cloth  6 each Topical Q0600  . clotrimazole   Topical BID  . feeding supplement (NEPRO CARB STEADY)  237 mL Oral TID WC  . heparin injection (subcutaneous)  5,000 Units Subcutaneous Q8H  . insulin aspart  0-15 Units Subcutaneous TID WC  . insulin aspart  0-5 Units Subcutaneous QHS  . levothyroxine  175 mcg Oral QAC breakfast  . lidocaine  1 application Urethral Once  . midodrine  10 mg Oral TID WC  . multivitamin  1 tablet Oral QHS  . polyethylene glycol  17 g Oral Daily  . QUEtiapine  12.5 mg Oral QHS  . sevelamer carbonate  2,400 mg Oral TID WC  . sodium chloride flush  10-40 mL Intracatheter Q12H  . tamsulosin  0.4 mg Oral Daily  . ticagrelor  90 mg Oral BID   Continuous Infusions: . sodium chloride Stopped (10/20/19 0400)  . cefTRIAXone (ROCEPHIN)  IV 1 g (10/21/19 1650)   PRN Meds: acetaminophen **OR** acetaminophen (TYLENOL) oral liquid 160 mg/5 mL **OR** acetaminophen, bisacodyl, ketoconazole, OLANZapine zydis, oxybutynin, sodium chloride flush   Vital Signs    Vitals:   10/20/19 2333 10/21/19 0334 10/21/19 0500 10/21/19 2009  BP: 126/68 137/68  92/61  Pulse: 68 88  69  Resp: 19 17  19   Temp: 97.9 F (36.6 C) 97.9 F (36.6 C)  98.3 F (36.8 C)  TempSrc: Oral Oral  Oral  SpO2: 95% 97%  96%  Weight: 92.6 kg  95 kg 97.1 kg  Height:        Intake/Output Summary (Last 24 hours) at 10/21/2019 2104 Last data filed at 10/21/2019 0319 Gross per 24 hour  Intake 240 ml  Output  --  Net 240 ml   Last 3 Weights 10/21/2019 10/21/2019 10/20/2019  Weight (lbs) 214 lb 1.1 oz 209 lb 7 oz 204 lb 2.3 oz  Weight (kg) 97.1 kg 95 kg 92.6 kg      Telemetry    First degree AV block, occasional Wenkebach.  Does have periods of 2:1 block where rate is slower but appears to be asymptomatic  - Personally Reviewed  ECG    No new tracings - Personally Reviewed  Physical Exam   GEN: No acute distress.   Neck: No JVD Cardiac: RRR, SEM; no rubs or gallops.  Respiratory: Clear to auscultation bilaterally. GI: Soft, nontender, non-distended  MS: No edema; No deformity. Neuro:  Nonfocal  Psych: Normal affect   Labs    High Sensitivity Troponin:   Recent Labs  Lab 10/17/19 1612 10/17/19 1816  TROPONINIHS 74* 77*      Chemistry Recent Labs  Lab 10/17/19 1122 10/18/19 0446 10/20/19 0557 10/21/19 0255 10/21/19 1806  NA 138   < > 138 137 140  K 3.9   < > 3.1* 3.3* 3.9  CL 97*   < > 101 98 96*  CO2 21*   < > 27 29 28   GLUCOSE 85   < >  126* 127* 150*  BUN 33*   < > 40* 23 31*  CREATININE 6.11*   < > 7.62* 5.11* 6.08*  CALCIUM 8.8*   < > 8.7* 8.9 9.5  ALBUMIN 3.0*  --   --   --   --   GFRNONAA 8*   < > 6* 10* 8*  GFRAA 9*   < > 7* 12* 9*  ANIONGAP 20*   < > 10 10 16*   < > = values in this interval not displayed.     Hematology Recent Labs  Lab 10/19/19 0126 10/20/19 0557 10/21/19 0255  WBC 11.0* 12.0* 14.7*  RBC 3.48* 3.25* 3.35*  HGB 11.3* 10.5* 10.8*  HCT 34.1* 32.5* 34.0*  MCV 98.0 100.0 101.5*  MCH 32.5 32.3 32.2  MCHC 33.1 32.3 31.8  RDW 12.9 13.1 13.2  PLT 273 290 330    BNPNo results for input(s): BNP, PROBNP in the last 168 hours.   DDimer No results for input(s): DDIMER in the last 168 hours.   Radiology    DG Chest 2 View  Result Date: 10/21/2019 CLINICAL DATA:  History of recent stroke with elevated white blood cell count, possible infiltrate EXAM: CHEST - 2 VIEW COMPARISON:  10/17/2019 FINDINGS: Cardiac shadow is enlarged but  stable. Dialysis catheter is been removed in the interval. Right basilar atelectasis is seen stable from the previous exam. Interval clearing in the left base is seen. Small effusions are noted posteriorly. IMPRESSION: Small effusions with right basilar atelectasis. Electronically Signed   By: Inez Catalina M.D.   On: 10/21/2019 12:34    Cardiac Studies   TTE 10/07/19:  1. Left ventricular ejection fraction, by visual estimation, is 60 to  65%. The left ventricle has normal function. There is moderately increased  left ventricular hypertrophy.  2. Left ventricular diastolic parameters are indeterminate.  3. The left ventricle has no regional wall motion abnormalities.  4. Global right ventricle has mildly reduced systolic function.The right  ventricular size is normal. No increase in right ventricular wall  thickness.  5. Left atrial size was normal.  6. Right atrial size was normal.  7. The mitral valve is normal in structure. Trivial mitral valve  regurgitation.  8. The tricuspid valve is normal in structure.  9. The tricuspid valve is normal in structure. Tricuspid valve  regurgitation is trivial.  10. The aortic valve is tricuspid. Aortic valve regurgitation is not  visualized. Mild to moderate aortic valve sclerosis/calcification without  any evidence of aortic stenosis.  11. The pulmonic valve was grossly normal. Pulmonic valve regurgitation is  not visualized.  12. Moderately elevated pulmonary artery systolic pressure.  13. No intracardiac source of embolism detected on this transthoracic  study. A transesophageal echocardiogram is recommended to exclude cardiac  source of embolism if clinically indicated.  14. The atrial septum is grossly normal.   Patient Profile     79 y.o. 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 -remains no indication for pacemaker  -continue to monitor on telemetry       For questions or updates, please contact [place] Please consult [email or web address] for contact info under  Signed, Donato Heinz, MD  10/21/2019, 9:04 PM

## 2019-10-21 NOTE — Progress Notes (Addendum)
  Frank [name] Progress Note   Subjective:   Pt seen on HD, no new c/o's.  Not able to pull much fluid w/ HD yest  Objective Vitals:   10/20/19 2005 10/20/19 2333 10/21/19 0334 10/21/19 0500  BP: 124/60 126/68 137/68   Pulse: 89 68 88   Resp: 18 19 17    Temp: 98 F (36.7 C) 97.9 F (36.6 C) 97.9 F (36.6 C)   TempSrc: Oral Oral Oral   SpO2: 95% 95% 97%   Weight:  92.6 kg  95 kg  Height:       Physical Exam General:NAD, WDWN Heart:RRR Lungs:CTAB, nml WOB Abdomen:soft, NTND Extremities:no LE edema Dialysis Access: LU AVG +b/t, HD cath R IJ   Dialysis:  TTS [facility]   4h  92kg   2/2.25 bath  LUA AVG  Hep 5000 Calcitriol0.61mcg PO qHD  Assessment/Plan: 1. 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 Per neuro. S/p ICA stent on 2/4, brilinta BID. Ok for heparin.   2. ESRD-HD TTS.Had  S/p temp cath 1/28 and AVG declot 1/29.   Using AVG successfully now, temp cath dc'd. HD today.  3. Hypotension/volume- BP's good. Not able to UF yest d/t BP drops. Wts here 3kg up but may have gained body wt vs wt's inaccurate. Min UF based on exam. Takes midodrine 10 tid at home, will ^ here to 10 tid.  4. Anemiaof CKD- Hgb 11.2. No indication for ESA at this time. 5. Secondary Hyperparathyroidism -P elevated and binder resumed - renvela 3 TID AC - continue VDRA.   Ca in goal.   6. Nutrition- Renal diet w/fluid /mutivit - add nepro  7. DM- per primary 8. Hypothyroidism - on levothyroxine 9. Dysphagia - on regular renal diet 10. UTI/urethritis:  cipro 11. Urinary retention: hx BPH, flomax held when in ICU w/ low BP's. Will resume flomax, hopefully can get foley out soon. Have d/w pmd 12. Dispo: awaiting snf  Kelly Splinter, MD 10/20/2019, 2:10 PM      Liver Function Tests: Medications: . sodium chloride Stopped (10/20/19 0400)  .  sodium chloride Stopped (10/20/19 0400)   . aspirin EC  81 mg Oral Daily  . atorvastatin  20 mg Oral q1800  . calcitRIOL  0.5 mcg Oral Q T,Th,Sa-HD  . Chlorhexidine Gluconate Cloth  6 each Topical Q0600  . Chlorhexidine Gluconate Cloth  6 each Topical Q0600  . clotrimazole   Topical BID  . feeding supplement (NEPRO CARB STEADY)  237 mL Oral TID WC  . finasteride  5 mg Oral QHS  . heparin injection (subcutaneous)  5,000 Units Subcutaneous Q8H  . insulin aspart  0-15 Units Subcutaneous TID WC  . insulin aspart  0-5 Units Subcutaneous QHS  . levothyroxine  175 mcg Oral QAC breakfast  . lidocaine  1 application Urethral Once  . midodrine  10 mg Oral BID WC  . multivitamin  1 tablet Oral QHS  . polyethylene glycol  17 g Oral Daily  . QUEtiapine  12.5 mg Oral QHS  . sevelamer carbonate  2,400 mg Oral TID WC  . sodium chloride flush  10-40 mL Intracatheter Q12H  . ticagrelor  90 mg Oral BID

## 2019-10-21 NOTE — Progress Notes (Signed)
Physical Therapy Treatment Patient Details Name: Frank [name] MRN: [id number] DOB: June 23, 1941 Today's Date: 10/21/2019    History of Present Illness 79 yo admitted with slurred speech and facial droop with multifocal watershed infarcts. PMhx: ESRD, DM, HTN, HLD, neuropathy, PAD, depression, gout    PT Comments    Pt performed gt training progress to outside of room.  He continues to be fearful of falling.  Pt remains very tangential and required redirection to focus on task during session.  Pt slow and guarded and continues to benefit from snf placement.  Pt seated in recliner with all needs in reach and chair alarm on.     Follow Up Recommendations  SNF;Supervision for mobility/OOB     Equipment Recommendations       Recommendations for Other Services       Precautions / Restrictions Precautions Precautions: Fall Precaution Comments: pt very anxious re: falling Restrictions Weight Bearing Restrictions: No    Mobility  Bed Mobility Overal bed mobility: Needs Assistance Bed Mobility: Sidelying to Sit;Rolling Rolling: Min assist Sidelying to sit: Mod assist       General bed mobility comments: Pt performed sidelying to sit with moderated assistance to advance LEs and elevate trunk into a seated position.  Once in sitting moderate assistance to move to edge of bed.  Transfers Overall transfer level: Needs assistance Equipment used: Rolling walker (2 wheeled) Transfers: Sit to/from Stand Sit to Stand: Mod assist         General transfer comment: Pt required min from elevated surface of bed with cues for hand placement.  Increased time to sit in recliner with poor eccentric load.  Ambulation/Gait Ambulation/Gait assistance: Min assist;+2 safety/equipment Gait Distance (Feet): 40 Feet Assistive device: Rolling walker (2 wheeled) Gait Pattern/deviations: Step-to pattern;Decreased step length - right;Decreased step length - left;Decreased dorsiflexion -  right;Decreased dorsiflexion - left;Shuffle;Trunk flexed Gait velocity: decreased   General Gait Details: Continues to present with decreased strength on L side.  Cues for upper trunk control and maintaining close proximity to RW.   Stairs             Wheelchair Mobility    Modified Rankin (Stroke Patients Only) Modified Rankin (Stroke Patients Only) Pre-Morbid Rankin Score: No symptoms Modified Rankin: Moderately severe disability     Balance Overall balance assessment: Needs assistance;History of Falls Sitting-balance support: No upper extremity supported;Feet supported Sitting balance-Leahy Scale: Fair       Standing balance-Leahy Scale: Poor                              Cognition Arousal/Alertness: Awake/alert Behavior During Therapy: Anxious;WFL for tasks assessed/performed Overall Cognitive Status: Impaired/Different from baseline Area of Impairment: Attention;Problem solving;Safety/judgement;Following commands;Memory                   Current Attention Level: Sustained Memory: Decreased short-term memory Following Commands: Follows one step commands with increased time;Follows multi-step commands inconsistently Safety/Judgement: Decreased awareness of deficits;Decreased awareness of safety   Problem Solving: Slow processing;Requires verbal cues;Requires tactile cues General Comments: Pt remains tangential with conversation.  He remains very anxious during session.      Exercises      General Comments        Pertinent Vitals/Pain Pain Assessment: Faces Faces Pain Scale: No hurt Pain Location: general discomfort when reaching to feet to attempt donning socks Pain Descriptors / Indicators: Discomfort Pain Intervention(s): Monitored during session;Repositioned    Home  Living                      Prior Function            PT Goals (current goals can now be found in the care plan section) Acute Rehab PT Goals Patient  Stated Goal: return home Potential to Achieve Goals: Fair Progress towards PT goals: Progressing toward goals    Frequency    Min 3X/week      PT Plan Current plan remains appropriate    Co-evaluation              AM-PAC PT "6 Clicks" Mobility   Outcome Measure  Help needed turning from your back to your side while in a flat bed without using bedrails?: A Little Help needed moving from lying on your back to sitting on the side of a flat bed without using bedrails?: A Lot Help needed moving to and from a bed to a chair (including a wheelchair)?: A Lot Help needed standing up from a chair using your arms (e.g., wheelchair or bedside chair)?: A Lot Help needed to walk in hospital room?: A Lot Help needed climbing 3-5 steps with a railing? : Total 6 Click Score: 12    End of Session Equipment Utilized During Treatment: Gait belt Activity Tolerance: Patient tolerated treatment well Patient left: in chair;with call bell/phone within reach;with chair alarm set;with nursing/sitter in room Nurse Communication: Mobility status;Other (comment) PT Visit Diagnosis: Other abnormalities of gait and mobility (R26.89);Difficulty in walking, not elsewhere classified (R26.2)     Time: [id number] PT Time Calculation (min) (ACUTE ONLY): 20 min  Charges:  $Gait Training: 8-22 mins                     Erasmo Leventhal , PTA Acute Rehabilitation Services Pager [phone] Office [phone]     Chinenye Katzenberger Eli Hose 10/21/2019, 5:44 PM

## 2019-10-21 NOTE — Progress Notes (Addendum)
Paged by Dr. Tyrell Antonio for recurrent bradycardia.  I have personally reviewed telemetry which showed first-degree AV block with intermittent episode of wenkebach.  Patient had a brief episode of second-degree type I bradycardia as below 2/10 @ 8:53am  2/10 @ 6:38am  No sustained bradycardia, second-degree type II or third-degree AV block.  He was asymptomatic and his episode was very brief.    He has baseline heart rhythm of sinus at rate of 80 to 90 bpm with first-degree AV block.  Frequent PVC and one episode of nonsustained VT for 6 beats.  Potassium was 3.3 this morning > got  total 40 mEq of supplemental potassium.  Pending BMET and Mg this evening. Keep Mg above 2 and K above 4.   Follow-up as recommended by Dr. Gardiner Rhyme.

## 2019-10-21 NOTE — Progress Notes (Addendum)
PROGRESS NOTE    Frank [name]  IPJ:[id number] DOB: 1940/09/20 DOA: 10/07/2019 PCP: McLean-Scocuzza, Nino Glow, MD   Brief Narrative: 79 year old with past medical history significant for ESRD on hemodialysis, diabetes, hypertension, hyperlipidemia, hypothyroidism, PAD, gout, depression who presented to the ED on 1/27 with complaining of weakness, facial droop, slurred speech.  He was hypotensive and there was concern for CVA.  Neurology was consulted.  Patient was admitted to ICU for IV pressors.  Stroke work-up revealed multiple scattered WM infarcts right more than left in watershed territories in the setting of hypoperfusion and right ICA high-grade stenosis.  Underwent cerebral arteriogram with right ICA revascularization/stent.  Currently plan is to continue dual antiplatelet therapy while awaiting a skilled nursing facility bed.  Hospital course complicated by delirium, dyspnea and telemetry abnormality prompting cardiology evaluation.    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 Status post bilateral angiogram followed by a stent assisted angioplasty of right ICA proximal with distal protection on 10/15/2019 -Echocardiogram ejection fraction 60 to 65%.  No source of embolus. -LDL: Hemoglobin A1c 6.7 -Continue with aspirin and Brilinta -Need to follow-up with neurology in 4 weeks. -Zyprexa/Seroquel as needed for metabolic encephalopathy delirium  2-ESRD on hemodialysis TTS, clotted AV fistula: Successful declot of left AV fistula on 10/09/2019 -Temporary hemodialysis catheter  placement on 10/08/2019. HD catheter was removed.   3-Chronic Hypotension;  -Initially low dose neo-synephrine drip in ICU.  -Was on midodrine TID. Then change to BID.- change back to TID by Nephrology.   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  Cardiology was consulted.  Per cardiology patient has episode of Wenkebach , no bradycardia or higher AV bloc, no need for PPM. Follow up with EP out patient.  -EKG today with bradycardia and wenckebach. Replete k. Dr Gardiner Rhyme reviewed EKG and looks similar than before.  -Called by nurse; patient Hr rate drops to 30--40 at times, and AV block. Will repeat B-met, Mg level. Cardiology consulted again.   Dyspnea: Chest x-ray on 12/6 showed mild bibasilar subsegmental atelectasis.  Incentive spirometry order  Insomnia delirium: Discontinue Ambien.  Continue with Seroquel.  CT head unremarkable on 12/7.  Hypothyroidism: Continue with Synthroid  Urinary retention: Patient has a history of BPH.  Per notes Flomax was discontinued in ICU due to concern with hypotension.  He failed voiding trial. Discussed with Dr. Melvia Heaps now the blood pressure are improved well start Flomax.  We will stop finasteride.  Anemia of chronic kidney disease: B12:1437. Hb stable at 12.  Obesity: BMI 32.  Monitor  Leukocytosis: Patient report mild cough.  Chest x-ray effusion and mild right-sided atelectasis. Continue with incentive spirometry. Check a sputum culture. Start empirically ceftriaxone. Check UA.   Hypokalemia: Replete orally.   Estimated body mass index is 32.8 kg/m as calculated from the following:   Height as of this encounter: '5\' 7"'  (1.702 m).   Weight as of this encounter: 95 kg.   DVT prophylaxis: Heparin Code Status:  Full code Family Communication: Will update  daughter Disposition Plan:  Patient is from: home Anticipated d/c date: In 1 or 2 days when bed available Barriers to d/c or necessity for inpatient status: Cocytosis, check chest x-ray, UA.  Start IV antibiotics.  Consultants:   Nephrology  Cardiology  Neurology  Procedures:  Underwent cerebral arteriogram with right ICA revascularization/stent.  Antimicrobials:    Subjective: Patient is alert and conversant.  He report mild cough, productive at time. He complains of back pain from being lying down some motion in the bed.  He is getting ready to sit in the recliner with assistant office staff.  He will eat breakfast sitting in the recliner.  He denies dysuria, diarrhea, abdominal pain.   Objective: Vitals:   10/20/19 2005 10/20/19 2333 10/21/19 0334 10/21/19 0500  BP: 124/60 126/68 137/68   Pulse: 89 68 88   Resp: '18 19 17   ' Temp: 98 F (36.7 C) 97.9 F (36.6 C) 97.9 F (36.6 C)   TempSrc: Oral Oral Oral   SpO2: 95% 95% 97%   Weight:  92.6 kg  95 kg  Height:        Intake/Output Summary (Last 24 hours) at 10/21/2019 1254 Last data filed at 10/21/2019 0319 Gross per 24 hour  Intake 1200 ml  Output 1072 ml  Net 128 ml   Filed Weights   10/20/19 1458 10/20/19 2333 10/21/19 0500  Weight: 93.6 kg 92.6 kg 95 kg    Examination:  General exam: Appears calm and comfortable  Respiratory system; crackles at the bases. Respiratory effort normal. Cardiovascular system: S1 & S2 heard, RRR. No JVD, murmurs, rubs, gallops or clicks. No pedal edema. Gastrointestinal system: Abdomen is nondistended, soft and nontender. No organomegaly or masses felt. Normal bowel sounds heard. Central nervous system: Alert and oriented.  Following commands Extremities: Symmetric 5 x 5 power. Skin: No rashes, lesions or ulcers   Data Reviewed: I have personally reviewed following labs and imaging studies  CBC: Recent Labs  Lab 10/16/19 0500 10/16/19 0500 10/17/19 1122 10/18/19 0446  10/19/19 0126 10/20/19 0557 10/21/19 0255  WBC 11.5*   < > 8.9 9.9 11.0* 12.0* 14.7*  NEUTROABS 9.7*  --  7.2  --   --   --   --   HGB 10.2*   < > 10.9* 11.2* 11.3* 10.5* 10.8*  HCT 32.1*   < > 33.6* 33.6* 34.1* 32.5* 34.0*  MCV 101.6*   < > 99.7 97.4 98.0 100.0 101.5*  PLT 255   < > 259 265 273 290 330   < > = values in this interval not displayed.   Basic Metabolic Panel: Recent Labs  Lab 10/17/19 1122 10/18/19 0446 10/19/19 0126 10/20/19 0557 10/21/19 0255  NA 138 137 138 138 137  K 3.9 3.5 3.5 3.1* 3.3*  CL 97* 97* 97* 101 98  CO2 21* '25 26 27 29  ' GLUCOSE 85 141* 124* 126* 127*  BUN 33* 17 24* 40* 23  CREATININE 6.11* 4.12* 5.74* 7.62* 5.11*  CALCIUM 8.8* 8.8* 8.8* 8.7* 8.9  MG  --  1.7  --   --   --   PHOS 5.7*  --   --   --   --    GFR: Estimated Creatinine Clearance: 13.3 mL/min (A) (by C-G formula based on SCr of 5.11 mg/dL (H)). Liver Function Tests: Recent Labs  Lab 10/17/19 1122  ALBUMIN 3.0*   No results for input(s): LIPASE, AMYLASE in the last 168 hours.  Recent Labs  Lab 10/18/19 1908  AMMONIA 24   Coagulation Profile: No results for input(s): INR, PROTIME in the last 168 hours. Cardiac Enzymes: No results for input(s): CKTOTAL, CKMB, CKMBINDEX, TROPONINI in the last 168 hours. BNP (last 3 results) No results for input(s): PROBNP in the last 8760 hours. HbA1C: No results for input(s): HGBA1C in the last 72 hours. CBG: Recent Labs  Lab 10/19/19 2151 10/20/19 0553 10/20/19 1622 10/20/19 2111 10/21/19 0625  GLUCAP 137* 115* 106* 191* 115*   Lipid Profile: No results for input(s): CHOL, HDL, LDLCALC, TRIG, CHOLHDL, LDLDIRECT in the last 72 hours. Thyroid Function Tests: No results for input(s): TSH, T4TOTAL, FREET4, T3FREE, THYROIDAB in the last 72 hours. Anemia Panel: Recent Labs    10/18/19 1908  VITAMINB12 1,437*  FOLATE 37.0   Sepsis Labs: No results for input(s): PROCALCITON, LATICACIDVEN in the last 168 hours.  Recent  Results (from the past 240 hour(s))  Surgical pcr screen     Status: None   Collection Time: 10/15/19  5:15 AM   Specimen: Nasal Mucosa; Nasal Swab  Result Value Ref Range Status   MRSA, PCR NEGATIVE NEGATIVE Final   Staphylococcus aureus NEGATIVE NEGATIVE Final    Comment: (NOTE) The Xpert SA Assay (FDA approved for NASAL specimens in patients 2 years of age and older), is one component of a comprehensive surveillance program. It is not intended to diagnose infection nor to guide or monitor treatment. Performed at [facility], [place] [address], [place], [place] [address]          Radiology Studies: DG Chest 2 View  Result Date: 10/21/2019 CLINICAL DATA:  History of recent stroke with elevated white blood cell count, possible infiltrate EXAM: CHEST - 2 VIEW COMPARISON:  10/17/2019 FINDINGS: Cardiac shadow is enlarged but stable. Dialysis catheter is been removed in the interval. Right basilar atelectasis is seen stable from the previous exam. Interval clearing in the left base is seen. Small effusions are noted posteriorly. IMPRESSION: Small effusions with right basilar atelectasis. Electronically Signed   By: Inez Catalina M.D.   On: 10/21/2019 12:34        Scheduled Meds: . aspirin EC  81 mg Oral Daily  . atorvastatin  20 mg Oral q1800  . calcitRIOL  0.5 mcg Oral Q T,Th,Sa-HD  . Chlorhexidine Gluconate Cloth  6 each Topical Q0600  . Chlorhexidine Gluconate Cloth  6 each Topical Q0600  . Chlorhexidine Gluconate Cloth  6 each Topical Q0600  . clotrimazole   Topical BID  . feeding supplement (NEPRO CARB STEADY)  237 mL Oral TID WC  . heparin injection (subcutaneous)  5,000 Units Subcutaneous Q8H  . insulin aspart  0-15 Units Subcutaneous TID WC  . insulin aspart  0-5 Units Subcutaneous QHS  . levothyroxine  175 mcg Oral QAC breakfast  . lidocaine  1 application Urethral Once  . midodrine  10 mg Oral TID WC  . multivitamin  1 tablet Oral QHS  . polyethylene glycol   17 g Oral Daily  . potassium chloride  20 mEq Oral TID  . QUEtiapine  12.5 mg Oral QHS  . sevelamer carbonate  2,400 mg Oral TID WC  . sodium chloride flush  10-40 mL Intracatheter Q12H  . tamsulosin  0.4 mg Oral Daily  . ticagrelor  90 mg Oral BID   Continuous Infusions: . sodium chloride Stopped (10/20/19 0400)     LOS: 14 days    Time spent: 35 minutes.  Elmarie Shiley, MD [facility]   If 7PM-7AM, please contact night-coverage [email or web address]  10/21/2019, 12:54 PM

## 2019-10-22 DIAGNOSIS — R001 Bradycardia, unspecified: Secondary | ICD-10-CM

## 2019-10-22 LAB — GLUCOSE, CAPILLARY
Glucose-Capillary: 150 mg/dL — ABNORMAL HIGH (ref 70–99)
Glucose-Capillary: 131 mg/dL — ABNORMAL HIGH (ref 70–99)
Glucose-Capillary: 120 mg/dL — ABNORMAL HIGH (ref 70–99)
Glucose-Capillary: 116 mg/dL — ABNORMAL HIGH (ref 70–99)
Glucose-Capillary: 89 mg/dL (ref 70–99)
Glucose-Capillary: 156 mg/dL — ABNORMAL HIGH (ref 70–99)
Glucose-Capillary: 106 mg/dL — ABNORMAL HIGH (ref 70–99)

## 2019-10-22 LAB — CBC
HCT: 34 % — ABNORMAL LOW (ref 39.0–52.0)
Hemoglobin: 10.7 g/dL — ABNORMAL LOW (ref 13.0–17.0)
MCH: 31.8 pg (ref 26.0–34.0)
MCHC: 31.5 g/dL (ref 30.0–36.0)
MCV: 100.9 fL — ABNORMAL HIGH (ref 80.0–100.0)
Platelets: 310 10*3/uL (ref 150–400)
RBC: 3.37 MIL/uL — ABNORMAL LOW (ref 4.22–5.81)
RDW: 13.3 % (ref 11.5–15.5)
WBC: 11.9 10*3/uL — ABNORMAL HIGH (ref 4.0–10.5)
nRBC: 0 % (ref 0.0–0.2)

## 2019-10-22 LAB — HEMOGLOBIN AND HEMATOCRIT, BLOOD
HCT: 34.2 % — ABNORMAL LOW (ref 39.0–52.0)
Hemoglobin: 11 g/dL — ABNORMAL LOW (ref 13.0–17.0)

## 2019-10-22 LAB — BASIC METABOLIC PANEL
Anion gap: 16 — ABNORMAL HIGH (ref 5–15)
BUN: 34 mg/dL — ABNORMAL HIGH (ref 8–23)
CO2: 24 mmol/L (ref 22–32)
Calcium: 9.2 mg/dL (ref 8.9–10.3)
Chloride: 99 mmol/L (ref 98–111)
Creatinine, Ser: 6.77 mg/dL — ABNORMAL HIGH (ref 0.61–1.24)
GFR calc Af Amer: 8 mL/min — ABNORMAL LOW (ref >60)
GFR calc non Af Amer: 7 mL/min — ABNORMAL LOW (ref >60)
Glucose, Bld: 131 mg/dL — ABNORMAL HIGH (ref 70–99)
Potassium: 3.9 mmol/L (ref 3.5–5.1)
Sodium: 139 mmol/L (ref 135–145)

## 2019-10-22 LAB — OCCULT BLOOD X 1 CARD TO LAB, STOOL: Fecal Occult Bld: POSITIVE — AB

## 2019-10-22 MED ORDER — BISACODYL 10 MG RE SUPP
10.0000 mg | Freq: Once | RECTAL | Status: AC
  Administered 2019-10-22: 10 mg via RECTAL
  Filled 2019-10-22: qty 1

## 2019-10-22 MED ORDER — ACETAMINOPHEN 325 MG PO TABS
ORAL_TABLET | ORAL | Status: AC
  Filled 2019-10-22: qty 2

## 2019-10-22 MED ORDER — PANTOPRAZOLE SODIUM 40 MG IV SOLR
40.0000 mg | Freq: Two times a day (BID) | INTRAVENOUS | Status: DC
  Administered 2019-10-22 – 2019-10-24 (×4): 40 mg via INTRAVENOUS
  Filled 2019-10-22 (×4): qty 40

## 2019-10-22 NOTE — Progress Notes (Signed)
Subjective:  On hd , no cos   Objective Vital signs in last 24 hours: Vitals:   10/22/19 1000 10/22/19 1030 10/22/19 1100 10/22/19 1115  BP: (!) 103/56 (!) 123/58 111/65 127/70  Pulse: 100 97 96 95  Resp: (!) 23 (!) 25 (!) 21 (!) 24  Temp:    98 F (36.7 C)  TempSrc:    Oral  SpO2:    97%  Weight:      Height:       Weight change: 2.3 kg  Physical Exam General:NAD, WDWN Heart:RRR no rub or mur  Lungs:CTAB, nml WOB Abdomen:soft, NTND Extremities:no LE edema Dialysis Access: LU AVG  Patent on HD HD   Dialysis:  TTS [facility]   4h  92kg   2/2.25 bath  LUA AVG  Hep 5000 Calcitriol0.11mcg PO qHD  Assessment/Plan: 1. 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 Per neuro.S/p ICA stent on 2/4, brilinta BID.Ok for heparin.  2. ESRD-HD TTS.Had S/p temp cath 1/28 and AVG declot 1/29.  Using AVG successfully now, temp cath dc'd. HD today.  3. Hypotension/volume- BP's good. Not able to UF yest d/t BP drops. Wts here 3kg up but may have gained body wt vs wt's inaccurate. Min UF based on exam. Takes midodrine 10 tid at home, will ^ here to 10 tid.  4. Anemiaof CKD- Hgb 11.2.>10.7  No indication for ESA at this time. 5. Secondary Hyperparathyroidism -P 5.7  renvela 3 TID AC - continue VDRA.   Ca corec mild ^ monitor    6. Nutrition- Renal diet w/fluid /mutivit - add nepro  7. DM- per primary 8. Hypothyroidism -on levothyroxine 9. Dysphagia - on regular renal diet 10. UTI/urethritis: cipro 11. Urinary retention: hx BPH, flomax held when in ICU w/ low BP's. Will resume flomax, hopefully can get foley out soon. Have d/w pmd 12. Dispo: awaiting snf  Navier Schiano, PA-C [facility] [phone] 10/22/2019,12:04 PM  LOS: 15 days   Labs: Basic Metabolic Panel: Recent Labs  Lab 10/17/19 1122 10/18/19 0446  10/21/19 0255 10/21/19 1806 10/22/19 0228  NA 138   < > 137 140 139  K 3.9   < > 3.3* 3.9 3.9  CL 97*   < > 98 96* 99  CO2 21*   < > 29 28 24   GLUCOSE 85   < > 127* 150* 131*  BUN 33*   < > 23 31* 34*  CREATININE 6.11*   < > 5.11* 6.08* 6.77*  CALCIUM 8.8*   < > 8.9 9.5 9.2  PHOS 5.7*  --   --   --   --    < > = values in this interval not displayed.   Liver Function Tests: Recent Labs  Lab 10/17/19 1122  ALBUMIN 3.0*   No results for input(s): LIPASE, AMYLASE in the last 168 hours. Recent Labs  Lab 10/18/19 1908  AMMONIA 24   CBC: Recent Labs  Lab 10/16/19 0500 10/16/19 0500 10/17/19 1122 10/17/19 1122 10/18/19 0446 10/18/19 0446 10/19/19 0126 10/19/19 0126 10/20/19 0557 10/21/19 0255 10/22/19 0228  WBC 11.5*   < > 8.9   < > 9.9   < > 11.0*   < > 12.0* 14.7* 11.9*  NEUTROABS 9.7*  --  7.2  --   --   --   --   --   --   --   --  HGB 10.2*   < > 10.9*   < > 11.2*   < > 11.3*   < > 10.5* 10.8* 10.7*  HCT 32.1*   < > 33.6*   < > 33.6*   < > 34.1*   < > 32.5* 34.0* 34.0*  MCV 101.6*   < > 99.7   < > 97.4  --  98.0  --  100.0 101.5* 100.9*  PLT 255   < > 259   < > 265   < > 273   < > 290 330 310   < > = values in this interval not displayed.   Cardiac Enzymes: No results for input(s): CKTOTAL, CKMB, CKMBINDEX, TROPONINI in the last 168 hours. CBG: Recent Labs  Lab 10/21/19 0625 10/21/19 1410 10/21/19 1632 10/21/19 2112 10/22/19 0618  GLUCAP 115* 150* 131* 120* 116*    Studies/Results: DG Chest 2 View  Result Date: 10/21/2019 CLINICAL DATA:  History of recent stroke with elevated white blood cell count, possible infiltrate EXAM: CHEST - 2 VIEW COMPARISON:  10/17/2019 FINDINGS: Cardiac shadow is enlarged but stable. Dialysis catheter is been removed in the interval. Right basilar atelectasis is seen stable from the previous exam. Interval clearing in the left base is seen. Small effusions are noted posteriorly. IMPRESSION: Small effusions with right basilar  atelectasis. Electronically Signed   By: Inez Catalina M.D.   On: 10/21/2019 12:34   Medications: . sodium chloride 10 mL/hr at 10/22/19 0433  . cefTRIAXone (ROCEPHIN)  IV 1 g (10/21/19 1650)   . aspirin EC  81 mg Oral Daily  . atorvastatin  20 mg Oral q1800  . calcitRIOL  0.5 mcg Oral Q T,Th,Sa-HD  . Chlorhexidine Gluconate Cloth  6 each Topical Q0600  . Chlorhexidine Gluconate Cloth  6 each Topical Q0600  . Chlorhexidine Gluconate Cloth  6 each Topical Q0600  . clotrimazole   Topical BID  . feeding supplement (NEPRO CARB STEADY)  237 mL Oral TID WC  . heparin injection (subcutaneous)  5,000 Units Subcutaneous Q8H  . insulin aspart  0-15 Units Subcutaneous TID WC  . insulin aspart  0-5 Units Subcutaneous QHS  . levothyroxine  175 mcg Oral QAC breakfast  . lidocaine  1 application Urethral Once  . midodrine  10 mg Oral TID WC  . multivitamin  1 tablet Oral QHS  . polyethylene glycol  17 g Oral Daily  . QUEtiapine  12.5 mg Oral QHS  . sevelamer carbonate  2,400 mg Oral TID WC  . sodium chloride flush  10-40 mL Intracatheter Q12H  . tamsulosin  0.4 mg Oral Daily  . ticagrelor  90 mg Oral BID

## 2019-10-22 NOTE — Progress Notes (Signed)
Progress Note  Patient Name: Frank [name] Date of Encounter: 10/22/2019  Primary Cardiologist: Thompson Grayer, MD   Subjective   Patient seen in HD today.  Reports he is doing well, denies any lightheadedness, dyspnea, or syncope.  Inpatient Medications    Scheduled Meds: . aspirin EC  81 mg Oral Daily  . atorvastatin  20 mg Oral q1800  . calcitRIOL  0.5 mcg Oral Q T,Th,Sa-HD  . Chlorhexidine Gluconate Cloth  6 each Topical Q0600  . Chlorhexidine Gluconate Cloth  6 each Topical Q0600  . Chlorhexidine Gluconate Cloth  6 each Topical Q0600  . clotrimazole   Topical BID  . feeding supplement (NEPRO CARB STEADY)  237 mL Oral TID WC  . heparin injection (subcutaneous)  5,000 Units Subcutaneous Q8H  . insulin aspart  0-15 Units Subcutaneous TID WC  . insulin aspart  0-5 Units Subcutaneous QHS  . levothyroxine  175 mcg Oral QAC breakfast  . lidocaine  1 application Urethral Once  . midodrine  10 mg Oral TID WC  . multivitamin  1 tablet Oral QHS  . polyethylene glycol  17 g Oral Daily  . QUEtiapine  12.5 mg Oral QHS  . sevelamer carbonate  2,400 mg Oral TID WC  . sodium chloride flush  10-40 mL Intracatheter Q12H  . tamsulosin  0.4 mg Oral Daily  . ticagrelor  90 mg Oral BID   Continuous Infusions: . sodium chloride 10 mL/hr at 10/22/19 0433  . cefTRIAXone (ROCEPHIN)  IV 1 g (10/21/19 1650)   PRN Meds: acetaminophen **OR** acetaminophen (TYLENOL) oral liquid 160 mg/5 mL **OR** acetaminophen, bisacodyl, ketoconazole, OLANZapine zydis, sodium chloride flush   Vital Signs    Vitals:   10/22/19 1000 10/22/19 1030 10/22/19 1100 10/22/19 1115  BP: (!) 103/56 (!) 123/58 111/65 127/70  Pulse: 100 97 96 95  Resp: (!) 23 (!) 25 (!) 21 (!) 24  Temp:    98 F (36.7 C)  TempSrc:    Oral  SpO2:    97%  Weight:      Height:        Intake/Output Summary (Last 24 hours) at 10/22/2019 1209 Last data filed at 10/22/2019 1115 Gross per 24 hour  Intake 380 ml  Output 400 ml  Net  -20 ml   Last 3 Weights 10/22/2019 10/21/2019 10/21/2019  Weight (lbs) 210 lb 8.6 oz 214 lb 1.1 oz 209 lb 7 oz  Weight (kg) 95.5 kg 97.1 kg 95 kg      Telemetry    First degree AV block, occasional Wenkebach.  Does have periods of 2:1 block where rate is slower but appears to be asymptomatic  - Personally Reviewed  ECG    No new tracings - Personally Reviewed  Physical Exam   GEN: No acute distress.   Neck: No JVD Cardiac: RRR, SEM; no rubs or gallops.  Respiratory: Clear to auscultation bilaterally. GI: Soft, nontender, non-distended  MS: No edema; No deformity. Neuro:  Nonfocal  Psych: Normal affect   Labs    High Sensitivity Troponin:   Recent Labs  Lab 10/17/19 1612 10/17/19 1816  TROPONINIHS 74* 77*      Chemistry Recent Labs  Lab 10/17/19 1122 10/18/19 0446 10/21/19 0255 10/21/19 1806 10/22/19 0228  NA 138   < > 137 140 139  K 3.9   < > 3.3* 3.9 3.9  CL 97*   < > 98 96* 99  CO2 21*   < > 29 28 24   GLUCOSE  85   < > 127* 150* 131*  BUN 33*   < > 23 31* 34*  CREATININE 6.11*   < > 5.11* 6.08* 6.77*  CALCIUM 8.8*   < > 8.9 9.5 9.2  ALBUMIN 3.0*  --   --   --   --   GFRNONAA 8*   < > 10* 8* 7*  GFRAA 9*   < > 12* 9* 8*  ANIONGAP 20*   < > 10 16* 16*   < > = values in this interval not displayed.     Hematology Recent Labs  Lab 10/20/19 0557 10/21/19 0255 10/22/19 0228  WBC 12.0* 14.7* 11.9*  RBC 3.25* 3.35* 3.37*  HGB 10.5* 10.8* 10.7*  HCT 32.5* 34.0* 34.0*  MCV 100.0 101.5* 100.9*  MCH 32.3 32.2 31.8  MCHC 32.3 31.8 31.5  RDW 13.1 13.2 13.3  PLT 290 330 310    BNPNo results for input(s): BNP, PROBNP in the last 168 hours.   DDimer No results for input(s): DDIMER in the last 168 hours.   Radiology    DG Chest 2 View  Result Date: 10/21/2019 CLINICAL DATA:  History of recent stroke with elevated white blood cell count, possible infiltrate EXAM: CHEST - 2 VIEW COMPARISON:  10/17/2019 FINDINGS: Cardiac shadow is enlarged but stable.  Dialysis catheter is been removed in the interval. Right basilar atelectasis is seen stable from the previous exam. Interval clearing in the left base is seen. Small effusions are noted posteriorly. IMPRESSION: Small effusions with right basilar atelectasis. Electronically Signed   By: Inez Catalina M.D.   On: 10/21/2019 12:34    Cardiac Studies   TTE 10/07/19:  1. Left ventricular ejection fraction, by visual estimation, is 60 to  65%. The left ventricle has normal function. There is moderately increased  left ventricular hypertrophy.  2. Left ventricular diastolic parameters are indeterminate.  3. The left ventricle has no regional wall motion abnormalities.  4. Global right ventricle has mildly reduced systolic function.The right  ventricular size is normal. No increase in right ventricular wall  thickness.  5. Left atrial size was normal.  6. Right atrial size was normal.  7. The mitral valve is normal in structure. Trivial mitral valve  regurgitation.  8. The tricuspid valve is normal in structure.  9. The tricuspid valve is normal in structure. Tricuspid valve  regurgitation is trivial.  10. The aortic valve is tricuspid. Aortic valve regurgitation is not  visualized. Mild to moderate aortic valve sclerosis/calcification without  any evidence of aortic stenosis.  11. The pulmonic valve was grossly normal. Pulmonic valve regurgitation is  not visualized.  12. Moderately elevated pulmonary artery systolic pressure.  13. No intracardiac source of embolism detected on this transthoracic  study. A transesophageal echocardiogram is recommended to exclude cardiac  source of embolism if clinically indicated.  14. The atrial septum is grossly normal.   Patient Profile     79 y.o. male with a hx of ESRD, DM, HTN, HLD, PAD, hypothyroidism who is being seen today for the evaluation of heart rhythm abnormalities at the request of Dr Posey Pronto  Assessment & Plan    First degree AV  block with intermittent Wenckebach, sometimes bradycardic when in 2:1 block, but episodes are short-lived and appears asymptomatic.  No significant pauses, sustained bradycardia or Mobitz II heart block. -remains no indication for pacemaker  -continue to monitor on telemetry       For questions or updates, please contact [place] Please  consult [email or web address] for contact info under      Signed, Donato Heinz, MD  10/22/2019, 12:09 PM

## 2019-10-22 NOTE — Evaluation (Signed)
Clinical/Bedside Swallow Evaluation Patient Details  Name: Frank [name] MRN: [id number] Date of Birth: 1941-02-04  Today's Date: 10/22/2019 Time: SLP Start Time (ACUTE ONLY): [id number] SLP Stop Time (ACUTE ONLY): 1436 SLP Time Calculation (min) (ACUTE ONLY): 24 min  Past Medical History:  Past Medical History:  Diagnosis Date  . Anemia of chronic kidney failure   . Arthritis   . Bradycardia   . Cataracts, bilateral 05/15/2012  . Chronic idiopathic gout of multiple sites 02/10/2015  . Chronic kidney disease    [facility]  . Depression   . Diabetes mellitus without complication (HCC)    diet controlled   . Diabetic nephropathy ([facility])   . ED (erectile dysfunction) 08/28/2012  . Elevated liver enzymes 06/10/2019  . Epiretinal membrane (ERM) of right eye 06/10/2019   Right eye saw AE 06/05/2019    . Essential hypertension 05/07/2013   Last Assessment & Plan:  Formatting of this note might be different from the original. HTN PLAN   BP goal is <140/90 BP Readings from Last 3 Encounters:  08/27/16 140/79  08/13/16 141/80  08/06/16 144/86   Current Anti-Hyptensive Medications: None  Today's Recommendations: Continue lifestyle modifications. Defer starting medication to PCP.  Recommend continued regular exercise as tolerated. Recomm  . History of UTI    s/p foley in hospital 01/2019   . Hyperlipidemia   . Hypothyroidism   . Illiterate 11/19/2013   Last Assessment & Plan:  Assisted patient with Handicapped Placard application today.    . Insomnia 05/08/2019  . Neuropathy   . Peripheral neuropathy   . Peripheral vascular disease ([place])   . Secondary hyperparathyroidism ([place])    Renal  . Thyroid disease    Past Surgical History:  Past Surgical History:  Procedure Laterality Date  . AV FISTULA PLACEMENT Left 11/19/2017   Procedure: ARTERIOVENOUS (AV) FISTULA CREATION;  Surgeon: [name], MD;  Location: [place];  Service: Vascular;  Laterality: Left;  . BASCILIC VEIN TRANSPOSITION  Left 06/04/2018   Procedure: BASILIC VEIN TRANSPOSITION ARM;  Surgeon: [name], MD;  Location: [facility];  Service: Vascular;  Laterality: Left;  . COLONOSCOPY    . DIALYSIS/PERMA CATHETER REMOVAL N/A 03/23/2019   Procedure: DIALYSIS/PERMA CATHETER REMOVAL;  Surgeon: [name], MD;  Location: [facility];  Service: Cardiovascular;  Laterality: N/A;  . EYE SURGERY     cataract surgery b/l; photophobia since   . INSERTION OF DIALYSIS CATHETER N/A 01/23/2019   Procedure: INSERTION OF DIALYSIS CATHETER;  Surgeon: [name], MD;  Location: [facility];  Service: Vascular;  Laterality: N/A;  INSERTION OF DIALYSIS CATHETER  . IR ANGIO INTRA EXTRACRAN SEL COM CAROTID INNOMINATE UNI L MOD SED  10/15/2019  . IR ANGIO VERTEBRAL SEL SUBCLAVIAN INNOMINATE UNI R MOD SED  10/15/2019  . IR CT HEAD LTD  10/15/2019  . IR FLUORO GUIDE CV LINE RIGHT  10/08/2019  . IR INTRAVSC STENT CERV CAROTID W/EMB-PROT MOD SED INCL ANGIO  10/15/2019  . IR THROMBECTOMY AV FISTULA W/THROMBOLYSIS/PTA INC/SHUNT/IMG LEFT Left 10/09/2019  . IR US GUIDE VASC ACCESS LEFT  10/09/2019  . IR US GUIDE VASC ACCESS RIGHT  10/08/2019  . KNEE ARTHROSCOPY Left    Knee  . PERIPHERAL VASCULAR THROMBECTOMY Left 07/06/2019   Procedure: PERIPHERAL VASCULAR THROMBECTOMY;  Surgeon: [name], MD;  Location: [facility];  Service: Cardiovascular;  Laterality: Left;  . RADIOLOGY WITH ANESTHESIA N/A 10/15/2019   Procedure: stent placement;  Surgeon: Luanne Bras,  MD;  Location: [facility];  Service: Radiology;  Laterality: N/A;  . THROMBECTOMY W/ EMBOLECTOMY Left 01/27/2019   Procedure: THROMBECTOMY Left arm ARTERIOVENOUS FISTULA  and Left arm Arteriovenous gortex graft;  Surgeon: [name], MD;  Location: [facility];  Service: Vascular;  Laterality: Left;  Marland Kitchen VENOGRAM Left 01/27/2019   Procedure: Left arm FISTULOGRAM/;  Surgeon: [name], MD;  Location: [facility];  Service: Vascular;  Laterality: Left;   HPI:  Pt is a  79 y.o. male admitted 10/07/19 with weakness, facial droop, and dysarthria. PMH: ESRD, DM, HTN, HLD, neuropathy, PAD, 2nd hyperparathyroidism, ED, depression, gout. Head CT: atrophy; Chest x-ray: Small effusions with right basilar atelectasis; WBC elevated on 2/11; afebrile   Assessment / Plan / Recommendation Clinical Impression  Pt was seen for bedside swallow evaluation secondary to episode of coughing with thin liquids on 10/21/19. Pt's daughter reported that the pt also always been a fast eater and his family constantly reminds him to slow down; however, he has not been symptomatic of dysphagia. Pt denied any signs of aspiration but indicated that he exhibited some difficulty masticating the carrots during lunch because they are too hard. He denied any significant difficulty with any other foods since admission. He presented with maxillary and mandibular dentures. Oral mechanism exam was unremarkable. Pt demonstrated a notably increased mastication time with solids. However, no signs or symptoms of aspiration were demonstrated. It is recommended that a regular texture diet be continued with observance of swallowing precautions. Pt, nursing and his daughter were educated regarding results and recommendations. SLP will follow to assess diet tolerance.  SLP Visit Diagnosis: Dysphagia, unspecified (R13.10)    Aspiration Risk  Mild aspiration risk    Diet Recommendation Regular;Thin liquid   Liquid Administration via: Cup;Straw Medication Administration: Whole meds with liquid Supervision: Patient able to self feed Compensations: Slow rate;Minimize environmental distractions Postural Changes: Seated upright at 90 degrees;Remain upright for at least 30 minutes after po intake    Other  Recommendations Oral Care Recommendations: Oral care BID   Follow up Recommendations None      Frequency and Duration min 2x/week  2 weeks       Prognosis Prognosis for Safe Diet Advancement: Good Barriers to  Reach Goals: Behavior      Swallow Study   General Date of Onset: 10/21/19 HPI: Pt is a 79 y.o. male admitted 10/07/19 with weakness, facial droop, and dysarthria. PMH: ESRD, DM, HTN, HLD, neuropathy, PAD, 2nd hyperparathyroidism, ED, depression, gout. Head CT: atrophy; Chest x-ray: Small effusions with right basilar atelectasis; WBC elevated on 2/11; afebrile Type of Study: Bedside Swallow Evaluation Previous Swallow Assessment: None Diet Prior to this Study: Regular;Thin liquids Temperature Spikes Noted: No Respiratory Status: Room air History of Recent Intubation: No Behavior/Cognition: Alert;Cooperative Oral Cavity Assessment: Within Functional Limits Oral Care Completed by SLP: No Oral Cavity - Dentition: Dentures, top;Dentures, bottom Vision: Functional for self-feeding Self-Feeding Abilities: Able to feed self Patient Positioning: Upright in bed;Postural control adequate for testing Baseline Vocal Quality: Normal Volitional Cough: Strong Volitional Swallow: Able to elicit    Oral/Motor/Sensory Function Overall Oral Motor/Sensory Function: Within functional limits   Ice Chips Ice chips: Within functional limits Presentation: Spoon   Thin Liquid Thin Liquid: Within functional limits Presentation: Cup;Spoon    Nectar Thick Nectar Thick Liquid: Not tested   Honey Thick Honey Thick Liquid: Not tested   Puree Puree: Not tested   Solid     Solid: Within functional limits Presentation: Self Fed  Other Comments: Pt's rate of intake was increased     Jerilee Space I. Hardin Negus, [place], [place] Office number [phone] Pager [phone]  Horton Marshall 10/22/2019,2:51 PM

## 2019-10-22 NOTE — Progress Notes (Addendum)
PROGRESS NOTE    Frank [name]  [id number] DOB: 01-01-1941 DOA: 10/07/2019 PCP: McLean-Scocuzza, Nino Glow, MD   Brief Narrative: 79 year old with past medical history significant for ESRD on hemodialysis, diabetes, hypertension, hyperlipidemia, hypothyroidism, PAD, gout, depression who presented to the ED on 1/27 with complaining of weakness, facial droop, slurred speech.  He was hypotensive and there was concern for CVA.  Neurology was consulted.  Patient was admitted to ICU for IV pressors.  Stroke work-up revealed multiple scattered WM infarcts right more than left in watershed territories in the setting of hypoperfusion and right ICA high-grade stenosis.  Underwent cerebral arteriogram with right ICA revascularization/stent.  Currently plan is to continue dual antiplatelet therapy while awaiting a skilled nursing facility bed.  Hospital course complicated by delirium, dyspnea and telemetry abnormality prompting cardiology evaluation.    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 Status post bilateral angiogram followed by a stent assisted angioplasty of right ICA proximal with distal protection on 10/15/2019 -Echocardiogram ejection fraction 60 to 65%.  No source of embolus. -LDL: Hemoglobin A1c 6.7 -Continue with aspirin and Brilinta -Need to follow-up with neurology in 4 weeks. -Zyprexa/Seroquel as needed for metabolic encephalopathy delirium  2-ESRD on hemodialysis TTS, clotted AV fistula: Successful declot of left AV fistula on  10/09/2019 -Temporary hemodialysis catheter placement on 10/08/2019. HD catheter was removed.   3-Chronic Hypotension;  -Initially low dose neo-synephrine drip in ICU.  -Was on midodrine TID. Then change to BID.- change back to TID by Nephrology.  -BP stable on addiction of Flomax.   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  Cardiology was consulted.  Per cardiology patient has episode of Wenkebach , no bradycardia or higher AV bloc, no need for PPM. Follow up with EP out patient.  -EKG today with bradycardia and wenckebach. Replete k. Dr Gardiner Rhyme reviewed EKG and looks similar than before.  -Called by nurse; patient Hr rate drops to 30--40 at times, and AV block. Mg replaced.  Cardiology consulted again. No indication for Pacemaker.   Dyspnea: Chest x-ray on 12/6 showed mild bibasilar subsegmental atelectasis.  Incentive spirometry order Improved..  Treating for PNA, day 2 IV antibiotics.   Insomnia delirium: Discontinue Ambien.  Continue with Seroquel.  CT head unremarkable on 12/7.  Hypothyroidism: Continue with Synthroid  Urinary retention: Patient has a history of BPH.  Per notes Flomax was discontinued in ICU due to concern with hypotension.  He failed voiding trial. Discussed with Dr. Melvia Heaps now the blood pressure are improved, Flomax was started.   Finasteride stopped.   Anemia of chronic kidney disease: B12:1437. Hb stable at 10  Obesity: BMI 32.  Monitor  PNA; Leukocytosis: Patient report mild cough.  Chest x-ray effusion and mild right-sided atelectasis. Continue with incentive spirometry. sputum culture ordered.  Continue with  Ceftriaxone. Day 2.  UA negative  Hypokalemia: Replaced.  Constipation; getting miralax. Dulcolax suppository  Addendum;  Called from  nurse patient had greenish  stool, small drop of blood.  Hold heparin sub q for DVT prophylaxis.  Check occult blood.  Repeat hb tonight and tomorrow morning.  Start IV PPI.    Estimated body mass index is 32.98 kg/m as calculated from the following:   Height as of this encounter: 5\' 7"  (1.702 m).   Weight as of this encounter: 95.5 kg.   DVT prophylaxis: Heparin Code Status: Full code Family Communication: Daughter updated at bedside.  Disposition Plan:  Patient is from: home Anticipated d/c date: 2-12 when bed available Barriers to d/c or necessity for inpatient status: speech swallow evaluation, need suppository for constipation, IV antibiotics for PNA>   Consultants:   Nephrology  Cardiology  Neurology  Procedures:  Underwent cerebral arteriogram with right ICA revascularization/stent.  Antimicrobials:    Subjective: Patient came from HD. He report dyspnea has improved some. Cough large phlegm at times.  No BM in several days. He is passing gas.  His stutter is a little worse since the stroke.  Cough some while drinking water, will get speech to evaluate  Objective: Vitals:   10/22/19 1030 10/22/19 1100 10/22/19 1115 10/22/19 1234  BP: (!) 123/58 111/65 127/70 (!) 156/65  Pulse: 97 96 95 93  Resp: (!) 25 (!) 21 (!) 24 20  Temp:   98 F (36.7 C) 98.1 F (36.7 C)  TempSrc:   Oral Oral  SpO2:   97% 98%  Weight:      Height:        Intake/Output Summary (Last 24 hours) at 10/22/2019 1427 Last data filed at 10/22/2019 1115 Gross per 24 hour  Intake 380 ml  Output 400 ml  Net -20 ml   Filed Weights   10/21/19 0500 10/21/19 2009 10/22/19 0120  Weight: 95 kg 97.1 kg 95.5 kg    Examination:  General exam: NAD Respiratory system; Normal respiratory effort, few crackles bases Cardiovascular system: S 1, S 2 RRR Gastrointestinal system: BS present, soft, mild tender Central nervous system: Alert, follows command Extremities: trace edema Skin: one small bruise left leg, and  superficial moderate size bruise right leg.    Data Reviewed: I have personally reviewed following labs and imaging studies  CBC: Recent Labs  Lab 10/16/19 0500 10/16/19 0500 10/17/19 1122 10/17/19 1122 10/18/19 0446 10/19/19 0126 10/20/19 0557 10/21/19 0255 10/22/19 0228  WBC 11.5*   < > 8.9   < > 9.9 11.0* 12.0* 14.7* 11.9*  NEUTROABS 9.7*  --  7.2  --   --   --   --   --   --   HGB 10.2*   < > 10.9*   < > 11.2* 11.3* 10.5* 10.8* 10.7*  HCT 32.1*   < > 33.6*   < > 33.6* 34.1* 32.5* 34.0* 34.0*  MCV 101.6*   < > 99.7   < > 97.4 98.0 100.0 101.5* 100.9*  PLT 255   < > 259   < > 265 273 290 330 310   < > = values in this interval not displayed.   Basic Metabolic Panel: Recent Labs  Lab 10/17/19 1122 10/17/19 1122 10/18/19 0446 10/18/19 0446 10/19/19 0126 10/20/19 0557 10/21/19 0255 10/21/19 1806 10/22/19 0228  NA 138   < > 137   < > 138 138 137 140 139  K 3.9   < > 3.5   < > 3.5 3.1* 3.3* 3.9 3.9  CL 97*   < > 97*   < > 97*  101 98 96* 99  CO2 21*   < > 25   < > 26 27 29 28 24   GLUCOSE 85   < > 141*   < > 124* 126* 127* 150* 131*  BUN 33*   < > 17   < > 24* 40* 23 31* 34*  CREATININE 6.11*   < > 4.12*   < > 5.74* 7.62* 5.11* 6.08* 6.77*  CALCIUM 8.8*   < > 8.8*   < > 8.8* 8.7* 8.9 9.5 9.2  MG  --   --  1.7  --   --   --   --  1.9  --   PHOS 5.7*  --   --   --   --   --   --   --   --    < > = values in this interval not displayed.   GFR: Estimated Creatinine Clearance: 10.1 mL/min (A) (by C-G formula based on SCr of 6.77 mg/dL (H)). Liver Function Tests: Recent Labs  Lab 10/17/19 1122  ALBUMIN 3.0*   No results for input(s): LIPASE, AMYLASE in the last 168 hours. Recent Labs  Lab 10/18/19 1908  AMMONIA 24   Coagulation Profile: No results for input(s): INR, PROTIME in the last 168 hours. Cardiac Enzymes: No results for input(s): CKTOTAL, CKMB, CKMBINDEX, TROPONINI in the last 168 hours. BNP (last 3 results) No results for input(s): PROBNP in the last  8760 hours. HbA1C: No results for input(s): HGBA1C in the last 72 hours. CBG: Recent Labs  Lab 10/21/19 1410 10/21/19 1632 10/21/19 2112 10/22/19 0618 10/22/19 1231  GLUCAP 150* 131* 120* 116* 89   Lipid Profile: No results for input(s): CHOL, HDL, LDLCALC, TRIG, CHOLHDL, LDLDIRECT in the last 72 hours. Thyroid Function Tests: No results for input(s): TSH, T4TOTAL, FREET4, T3FREE, THYROIDAB in the last 72 hours. Anemia Panel: No results for input(s): VITAMINB12, FOLATE, FERRITIN, TIBC, IRON, RETICCTPCT in the last 72 hours. Sepsis Labs: No results for input(s): PROCALCITON, LATICACIDVEN in the last 168 hours.  Recent Results (from the past 240 hour(s))  Surgical pcr screen     Status: None   Collection Time: 10/15/19  5:15 AM   Specimen: Nasal Mucosa; Nasal Swab  Result Value Ref Range Status   MRSA, PCR NEGATIVE NEGATIVE Final   Staphylococcus aureus NEGATIVE NEGATIVE Final    Comment: (NOTE) The Xpert SA Assay (FDA approved for NASAL specimens in patients 78 years of age and older), is one component of a comprehensive surveillance program. It is not intended to diagnose infection nor to guide or monitor treatment. Performed at [facility], [place] [address] [place], [place] [address]          Radiology Studies: DG Chest 2 View  Result Date: 10/21/2019 CLINICAL DATA:  History of recent stroke with elevated white blood cell count, possible infiltrate EXAM: CHEST - 2 VIEW COMPARISON:  10/17/2019 FINDINGS: Cardiac shadow is enlarged but stable. Dialysis catheter is been removed in the interval. Right basilar atelectasis is seen stable from the previous exam. Interval clearing in the left base is seen. Small effusions are noted posteriorly. IMPRESSION: Small effusions with right basilar atelectasis. Electronically Signed   By: Inez Catalina M.D.   On: 10/21/2019 12:34        Scheduled Meds: . aspirin EC  81 mg Oral Daily  . atorvastatin  20 mg Oral q1800  .  bisacodyl  10 mg Rectal Once  . calcitRIOL  0.5 mcg Oral  Q T,Th,Sa-HD  . Chlorhexidine Gluconate Cloth  6 each Topical Q0600  . Chlorhexidine Gluconate Cloth  6 each Topical Q0600  . Chlorhexidine Gluconate Cloth  6 each Topical Q0600  . clotrimazole   Topical BID  . feeding supplement (NEPRO CARB STEADY)  237 mL Oral TID WC  . heparin injection (subcutaneous)  5,000 Units Subcutaneous Q8H  . insulin aspart  0-15 Units Subcutaneous TID WC  . insulin aspart  0-5 Units Subcutaneous QHS  . levothyroxine  175 mcg Oral QAC breakfast  . lidocaine  1 application Urethral Once  . midodrine  10 mg Oral TID WC  . multivitamin  1 tablet Oral QHS  . polyethylene glycol  17 g Oral Daily  . QUEtiapine  12.5 mg Oral QHS  . sevelamer carbonate  2,400 mg Oral TID WC  . sodium chloride flush  10-40 mL Intracatheter Q12H  . tamsulosin  0.4 mg Oral Daily  . ticagrelor  90 mg Oral BID   Continuous Infusions: . sodium chloride 10 mL/hr at 10/22/19 0433  . cefTRIAXone (ROCEPHIN)  IV 1 g (10/22/19 1259)     LOS: 15 days    Time spent: 35 minutes.     Elmarie Shiley, MD [facility]   If 7PM-7AM, please contact night-coverage [email or web address]  10/22/2019, 2:27 PM

## 2019-10-22 NOTE — Progress Notes (Signed)
Physical Therapy Treatment Patient Details Name: Frank [name] MRN: [id number] DOB: Dec 05, 1940 Today's Date: 10/22/2019    History of Present Illness 79 yo admitted with slurred speech and facial droop with multifocal watershed infarcts. PMhx: ESRD, DM, HTN, HLD, neuropathy, PAD, depression, gout    PT Comments    Pt performed gt training limited from commode to recliner.  He was resistant to stand and continues to be feaful of falling.  He performed decreased mobility this session and continues to benefit from skilled rehab at SNF at d/c.  He continues to benefit from skilled rehab.  Noted fatigue after dialysis.  He did have a large BM this session and nursing notified.      Follow Up Recommendations  SNF;Supervision for mobility/OOB     Equipment Recommendations  Rolling walker with 5" wheels;3in1 (PT)    Recommendations for Other Services       Precautions / Restrictions Precautions Precautions: Fall Precaution Comments: pt very anxious re: falling Restrictions Weight Bearing Restrictions: No    Mobility  Bed Mobility Overal bed mobility: Needs Assistance Bed Mobility: Sidelying to Sit;Rolling Rolling: Min assist Sidelying to sit: Mod assist       General bed mobility comments: Pt performed sidelying to sit with moderated assistance to advance LEs and elevate trunk into a seated position.  Smoother transition noted this session.  Transfers Overall transfer level: Needs assistance Equipment used: Rolling walker (2 wheeled) Transfers: Sit to/from Stand Sit to Stand: Mod assist         General transfer comment: Pt required moderate assistance to rise into standing.  He required constant cues for hand placement to and from seated surface.  Performed several standing trials from commode and recliner chair.  Ambulation/Gait Ambulation/Gait assistance: Min assist;+2 safety/equipment Gait Distance (Feet): 8 Feet Assistive device: Rolling walker (2 wheeled) Gait  Pattern/deviations: Step-to pattern;Decreased step length - right;Decreased step length - left;Decreased dorsiflexion - right;Decreased dorsiflexion - left;Shuffle;Trunk flexed Gait velocity: decreased   General Gait Details: Pt with L lateral lean and very limited in ability to progress ambulation.   Stairs             Wheelchair Mobility    Modified Rankin (Stroke Patients Only) Modified Rankin (Stroke Patients Only) Pre-Morbid Rankin Score: No symptoms Modified Rankin: Moderately severe disability     Balance Overall balance assessment: Needs assistance;History of Falls Sitting-balance support: No upper extremity supported;Feet supported Sitting balance-Leahy Scale: Fair       Standing balance-Leahy Scale: Poor Standing balance comment: min a- min guard  for balance while standing at sink with OT                            Cognition Arousal/Alertness: Awake/alert Behavior During Therapy: Anxious;WFL for tasks assessed/performed Overall Cognitive Status: Impaired/Different from baseline Area of Impairment: Attention;Problem solving;Safety/judgement;Following commands;Memory                   Current Attention Level: Sustained Memory: Decreased short-term memory Following Commands: Follows one step commands with increased time;Follows multi-step commands inconsistently Safety/Judgement: Decreased awareness of deficits;Decreased awareness of safety   Problem Solving: Slow processing;Requires verbal cues;Requires tactile cues General Comments: Pt remains tangential with conversation.  He remains very anxious during session.      Exercises      General Comments        Pertinent Vitals/Pain Pain Assessment: No/denies pain Faces Pain Scale: Hurts little more Pain Location: B knees  Pain Descriptors / Indicators: Discomfort Pain Intervention(s): Monitored during session;Repositioned    Home Living                      Prior  Function            PT Goals (current goals can now be found in the care plan section) Acute Rehab PT Goals Patient Stated Goal: return home Potential to Achieve Goals: Fair Progress towards PT goals: Progressing toward goals    Frequency    Min 3X/week      PT Plan Current plan remains appropriate    Co-evaluation              AM-PAC PT "6 Clicks" Mobility   Outcome Measure  Help needed turning from your back to your side while in a flat bed without using bedrails?: A Little Help needed moving from lying on your back to sitting on the side of a flat bed without using bedrails?: A Lot Help needed moving to and from a bed to a chair (including a wheelchair)?: A Lot Help needed standing up from a chair using your arms (e.g., wheelchair or bedside chair)?: A Lot Help needed to walk in hospital room?: A Lot Help needed climbing 3-5 steps with a railing? : Total 6 Click Score: 12    End of Session Equipment Utilized During Treatment: Gait belt Activity Tolerance: Patient tolerated treatment well Patient left: in chair;with call bell/phone within reach;with chair alarm set;with nursing/sitter in room Nurse Communication: Mobility status;Other (comment) PT Visit Diagnosis: Other abnormalities of gait and mobility (R26.89);Difficulty in walking, not elsewhere classified (R26.2)     Time: [id number] PT Time Calculation (min) (ACUTE ONLY): 46 min  Charges:  $Gait Training: 8-22 mins $Therapeutic Activity: 23-37 mins                     Frank [name] , PTA Acute Rehabilitation Services Pager [phone] Office [phone]     Frank [name] 10/22/2019, 5:33 PM

## 2019-10-23 DIAGNOSIS — I498 Other specified cardiac arrhythmias: Secondary | ICD-10-CM

## 2019-10-23 LAB — SARS CORONAVIRUS 2 (TAT 6-24 HRS): SARS Coronavirus 2: NEGATIVE

## 2019-10-23 LAB — MAGNESIUM: Magnesium: 2.3 mg/dL (ref 1.7–2.4)

## 2019-10-23 LAB — CBC
HCT: 32.1 % — ABNORMAL LOW (ref 39.0–52.0)
Hemoglobin: 10 g/dL — ABNORMAL LOW (ref 13.0–17.0)
MCH: 32.5 pg (ref 26.0–34.0)
MCHC: 31.2 g/dL (ref 30.0–36.0)
MCV: 104.2 fL — ABNORMAL HIGH (ref 80.0–100.0)
Platelets: 331 10*3/uL (ref 150–400)
RBC: 3.08 MIL/uL — ABNORMAL LOW (ref 4.22–5.81)
RDW: 13.2 % (ref 11.5–15.5)
WBC: 10.6 10*3/uL — ABNORMAL HIGH (ref 4.0–10.5)
nRBC: 0 % (ref 0.0–0.2)

## 2019-10-23 LAB — GLUCOSE, CAPILLARY
Glucose-Capillary: 161 mg/dL — ABNORMAL HIGH (ref 70–99)
Glucose-Capillary: 153 mg/dL — ABNORMAL HIGH (ref 70–99)
Glucose-Capillary: 121 mg/dL — ABNORMAL HIGH (ref 70–99)
Glucose-Capillary: 156 mg/dL — ABNORMAL HIGH (ref 70–99)

## 2019-10-23 LAB — BASIC METABOLIC PANEL
Anion gap: 10 (ref 5–15)
BUN: 21 mg/dL (ref 8–23)
CO2: 28 mmol/L (ref 22–32)
Calcium: 9.2 mg/dL (ref 8.9–10.3)
Chloride: 101 mmol/L (ref 98–111)
Creatinine, Ser: 5.45 mg/dL — ABNORMAL HIGH (ref 0.61–1.24)
GFR calc Af Amer: 11 mL/min — ABNORMAL LOW (ref >60)
GFR calc non Af Amer: 9 mL/min — ABNORMAL LOW (ref >60)
Glucose, Bld: 153 mg/dL — ABNORMAL HIGH (ref 70–99)
Potassium: 4.2 mmol/L (ref 3.5–5.1)
Sodium: 139 mmol/L (ref 135–145)

## 2019-10-23 LAB — TROPONIN I (HIGH SENSITIVITY)
Troponin I (High Sensitivity): 59 ng/L — ABNORMAL HIGH (ref <18)
Troponin I (High Sensitivity): 64 ng/L — ABNORMAL HIGH (ref <18)

## 2019-10-23 MED ORDER — POLYETHYLENE GLYCOL 3350 17 G PO PACK: 17.0000 g | PACK | Freq: Every day | ORAL | 0 refills | Status: DC

## 2019-10-23 MED ORDER — NEPRO/CARBSTEADY PO LIQD: 237.0000 mL | Freq: Three times a day (TID) | ORAL | 0 refills | Status: DC

## 2019-10-23 MED ORDER — TICAGRELOR 90 MG PO TABS: 90.0000 mg | ORAL_TABLET | Freq: Two times a day (BID) | ORAL | 3 refills | Status: DC

## 2019-10-23 MED ORDER — CALCITRIOL 0.5 MCG PO CAPS: 0.5000 ug | ORAL_CAPSULE | ORAL | 1 refills | Status: DC

## 2019-10-23 MED ORDER — MIDODRINE HCL 10 MG PO TABS: 10.0000 mg | ORAL_TABLET | Freq: Three times a day (TID) | ORAL | 0 refills | Status: DC

## 2019-10-23 MED ORDER — LEVOTHYROXINE SODIUM 175 MCG PO TABS: 175.0000 ug | ORAL_TABLET | Freq: Every day | ORAL | 0 refills | Status: DC

## 2019-10-23 MED ORDER — TAMSULOSIN HCL 0.4 MG PO CAPS: 0.4000 mg | ORAL_CAPSULE | Freq: Every day | ORAL | 1 refills | Status: AC

## 2019-10-23 MED ORDER — AMOXICILLIN-POT CLAVULANATE 875-125 MG PO TABS: 1.0000 | ORAL_TABLET | Freq: Two times a day (BID) | ORAL | 0 refills | Status: DC

## 2019-10-23 MED ORDER — SEVELAMER CARBONATE 800 MG PO TABS: 2400.0000 mg | ORAL_TABLET | Freq: Three times a day (TID) | ORAL | 0 refills | Status: AC

## 2019-10-23 MED ORDER — PANTOPRAZOLE SODIUM 40 MG PO TBEC: 40.0000 mg | DELAYED_RELEASE_TABLET | Freq: Every day | ORAL | 1 refills | Status: AC

## 2019-10-23 MED ORDER — QUETIAPINE FUMARATE 25 MG PO TABS: 12.5000 mg | ORAL_TABLET | Freq: Every day | ORAL | 0 refills | Status: DC

## 2019-10-23 NOTE — Progress Notes (Signed)
Telemetry called to notify ST elevation of 2.5 in patient MCL lead. Patient was asleep and asymptomatic upon assessment.MD notified and order in for lab and EKG.Will continue to monitor patient.

## 2019-10-23 NOTE — Discharge Summary (Addendum)
Physician Discharge Summary  MUSSA GROESBECK MWU:[id number] DOB: 07/07/1941 DOA: 10/07/2019  PCP: McLean-Scocuzza, Nino Glow, MD  Admit date: 10/07/2019 Discharge date: 10/23/2019  Admitted From: Home  Disposition:  SNF  Recommendations for Outpatient Follow-up:  1. Follow up with PCP in 1-2 weeks 2. Please obtain BMP/CBC in one week 3. Follow up with Cardiology for further evaluation of A-V block  4. Follow up with Dr Estanislado Pandy in 4 weeks post stent.  5. Monitor hb and Potasium level.  6. Needs to follow up with urology for voiding trial.    Discharge Condition: Stable.  CODE STATUS: Full code Diet recommendation: Renal Diet.   Brief/Interim Summary: 79 year old with past medical history significant for ESRD on hemodialysis, diabetes, hypertension, hyperlipidemia, hypothyroidism, PAD, gout, depression who presented to the ED on 1/27 with complaining of weakness, facial droop, slurred speech.  He was hypotensive and there was concern for CVA.  Neurology was consulted.  Patient was admitted to ICU for IV pressors.  Stroke work-up revealed multiple scattered WM infarcts right more than left in watershed territories in the setting of hypoperfusion and right ICA high-grade stenosis.  Underwent cerebral arteriogram with right ICA revascularization/stent.  Currently plan is to continue dual antiplatelet therapy while awaiting a skilled nursing facility bed.  Hospital course complicated by delirium, dyspnea and telemetry abnormality prompting cardiology evaluation.  1-Multiple scattered WM infarcts right more than left in watershed territories -In the setting of hypoperfusion and right ICA high-grade stenosis. Status post bilateral angiogram followed by a stent assisted angioplasty of right ICA proximal with distal protection on 10/15/2019 -Echocardiogram ejection fraction 60 to 65%.  No source of embolus. -LDL: Hemoglobin A1c 6.7 -Continue with aspirin and Brilinta -Need to follow-up with neurology  in 4 weeks. -Zyprexa/Seroquel as needed for metabolic encephalopathy delirium  2-ESRD on hemodialysis TTS, clotted AV fistula: Successful declot of left AV fistula on 10/09/2019 -Temporary hemodialysis catheter placement on 10/08/2019. HD catheter was removed.   3-Chronic Hypotension;  -Initially low dose neo-synephrine drip in ICU.  -Was on midodrine TID. Then change to BID.- change back to TID by Nephrology.  -BP stable on addiction of Flomax.   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  Cardiology was consulted.  Per cardiology patient has episode of Wenkebach , no bradycardia or higher AV bloc, no need for PPM. Follow up with EP out patient.  -EKG today with bradycardia and wenckebach. Replete k. Dr Gardiner Rhyme reviewed EKG and looks similar than before.  -Called by nurse; patient Hr rate drops to 30--40 at times, and AV block. Mg replaced.  Cardiology consulted again. No indication for Pacemaker.  -Bigeminy. Electrolytes normal. Discussed with Dr Gardiner Rhyme, patient is stable for discharge./  -cardiology will arrange follow up with EP.   Dyspnea: Chest x-ray on 12/6 showed mild bibasilar subsegmental atelectasis.  Incentive spirometry order Improved..  Treating for PNA, day 2 IV antibiotics.  report improvement of dyspnea.   Insomnia delirium: Discontinue Ambien.  Continue with Seroquel.  CT head unremarkable on 12/7.  Hypothyroidism: Continue with Synthroid  Urinary retention: Patient has a history of BPH.  Per notes Flomax was discontinued in ICU due to concern with hypotension.  He failed voiding trial. Discussed with Dr. Melvia Heaps now the blood pressure are improved, Flomax was started.   Finasteride stopped.  Needs follow up with urology and voiding trial.   Anemia  of chronic kidney  disease: [id number]. Hb stable at 10  Obesity: BMI 32.  Monitor  PNA; Leukocytosis: WBC decreased to 10.  Patient report mild cough.  Chest x-ray effusion and mild right-sided atelectasis. Continue with incentive spirometry. sputum culture ordered.  Treated with ceftriaxone for 2 days IV. Plan to discharge on Augmentin. Can received antibiotics after HD> on HD days.  UA negative  Hypokalemia: Replaced.  Constipation; getting miralax. Received Dulcolax suppository Had BM.  DM; check CBG four times a day before meals and bed time  Monitor CBG, he has not required significant amount of insulin.   Smear blood stool, occult blood positive;  Episode happen after suppository. No further Bowel movement of bleeding.  Hold heparin sub q for DVT prophylaxis.  Hb has remain stable at 10.  Discharge on PPI.  Needs colonoscopy out patient when he recovers.   ST changes; in one Lead V2; Patient asymptomatic. EKG ordered.  Discussed with cardiology, mild increase  ST V 2. Plan to check troponin.      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 ([facility])   Atrioventricular block, Mobitz type 1, Wenckebach    Discharge Instructions  Discharge Instructions    Diet - low sodium heart healthy   Complete by: As directed    Increase activity slowly   Complete by: As directed      Allergies as of 10/23/2019      Reactions   Ace Inhibitors Other (See Comments)   Hyperkalemia on ACE INHIBITORS Cr>1.9   Angiotensin Receptor Blockers Other (See Comments)   Hyperkalemia Elevates Cr > 1.9   Ivp Dye [iodinated Diagnostic Agents] Other (See Comments)   Cannot have due to renal failure   Adhesive [tape] Other (See Comments)   UNDESCRIBED  REACTION Can tolerate ONLY Coban wrap or mild paper tape!!      Medication List    STOP taking these medications   FLUAD IM   gabapentin 300 MG capsule Commonly known as: NEURONTIN   lidocaine-prilocaine cream Commonly known as: EMLA   ondansetron 4 MG tablet Commonly known as: ZOFRAN   PRESCRIPTION MEDICATION   sevelamer carbonate 2.4 g Pack Commonly known as: RENVELA Replaced by: sevelamer carbonate 800 MG tablet     TAKE these medications   allopurinol 100 MG tablet Commonly known as: ZYLOPRIM Take 1 tablet (100 mg total) by mouth daily.   amoxicillin-clavulanate 875-125 MG tablet Commonly known as: Augmentin Take 1 tablet by mouth 2 (two) times daily for 4 days.   aspirin EC 81 MG tablet Take 81 mg daily by mouth.   atorvastatin 20 MG tablet Commonly known as: LIPITOR Take 20 mg by mouth daily at 6 PM.   BALMEX EX Apply 1 application topically daily as needed (to irritated areas of skin).   calcitRIOL 0.5 MCG capsule Commonly known as: ROCALTROL Take 1 capsule (0.5 mcg total) by mouth Every Tuesday,Thursday,and Saturday with dialysis. Start taking on: October 24, 2019 What changed:   when to take this  additional instructions   clotrimazole 1 % cream Commonly known as: LOTRIMIN Apply 1 application topically daily as needed (For yeast, groin rash).   docusate sodium 100 MG capsule Commonly known as: COLACE Take 200 mg by mouth daily  as needed.   feeding supplement (NEPRO CARB STEADY) Liqd Take 237 mLs by mouth 3 (three) times daily with meals.   ketoconazole 2 % shampoo Commonly known as: Nizoral Apply 1 application topically 2 (two) times a week. What changed:   when to take this  reasons to take this   levothyroxine 175 MCG tablet Commonly known as: SYNTHROID Take 1 tablet (175 mcg total) by mouth daily before breakfast. Start taking on: October 24, 2019 What changed:   medication strength  how much to take   midodrine 10 MG  tablet Commonly known as: PROAMATINE Take 1 tablet (10 mg total) by mouth 3 (three) times daily with meals. What changed:   when to take this  additional instructions   multivitamin Tabs tablet Take 1 tablet daily by mouth.   pantoprazole 40 MG tablet Commonly known as: Protonix Take 1 tablet (40 mg total) by mouth daily.   polyethylene glycol 17 g packet Commonly known as: MIRALAX / GLYCOLAX Take 17 g by mouth daily.   QUEtiapine 25 MG tablet Commonly known as: SEROQUEL Take 0.5 tablets (12.5 mg total) by mouth at bedtime.   sevelamer carbonate 800 MG tablet Commonly known as: RENVELA Take 3 tablets (2,400 mg total) by mouth 3 (three) times daily with meals. Replaces: sevelamer carbonate 2.4 g Pack   tamsulosin 0.4 MG Caps capsule Commonly known as: FLOMAX Take 1 capsule (0.4 mg total) by mouth daily.   ticagrelor 90 MG Tabs tablet Commonly known as: BRILINTA Take 1 tablet (90 mg total) by mouth 2 (two) times daily.       Contact information for follow-up providers    Luanne Bras, MD Follow up in 4 week(s).   Specialties: Interventional Radiology, Radiology Why: Please follow-up withj Dr. Estanislado Pandy in clinic 4 weeks after discharge. Our office will call you to set up this appointment. Contact information: [address] [place] [address] [phone]            Contact information for after-discharge care    Destination    [facility] .   Service: Skilled Nursing Contact information: [address] [place] [phone]                 Allergies  Allergen Reactions  . Ace Inhibitors Other (See Comments)    Hyperkalemia on ACE INHIBITORS Cr>1.9  . Angiotensin Receptor Blockers Other (See Comments)    Hyperkalemia Elevates Cr > 1.9   . Ivp Dye [Iodinated Diagnostic Agents] Other (See Comments)    Cannot have due to renal failure  . Adhesive [Tape] Other (See Comments)     UNDESCRIBED REACTION Can tolerate ONLY Coban wrap or mild paper tape!!    Consultations:  Cardiology  Neurology  IR   Procedures/Studies: CT ANGIO HEAD W OR WO CONTRAST  Result Date: 10/07/2019 CLINICAL DATA:  Code stroke, follow-up EXAM: CT ANGIOGRAPHY HEAD AND NECK CT PERFUSION BRAIN TECHNIQUE: Multidetector CT imaging of the head and neck was performed using the standard protocol during bolus administration of intravenous contrast. Multiplanar CT image reconstructions and MIPs were obtained to evaluate the vascular anatomy. Carotid stenosis measurements (when applicable) are obtained utilizing NASCET criteria, using the distal internal carotid diameter as the denominator. Multiphase CT imaging of the brain was performed following IV bolus contrast injection. Subsequent parametric perfusion maps were calculated using RAPID software. CONTRAST:  115m OMNIPAQUE IOHEXOL 350 MG/ML SOLN COMPARISON:  None. FINDINGS: CTA NECK FINDINGS Aortic arch: Great vessel  origins are patent. There is right origin of the left vertebral artery from the arch, with calcified plaque noted at the origin. Right carotid system: Patent. There is calcified and noncalcified plaque along the proximal ICA causing 75% stenosis. Left carotid system: Patent. There is calcified plaque at the ICA origin causing minimal stenosis. Vertebral arteries: Patent. Calcified plaque at both vertebral artery origins without evidence of flow limitation. Right vertebral artery is dominant. Skeleton: Degenerative changes. Other neck: No mass or adenopathy. Upper chest: No apical lung mass. Review of the MIP images confirms the above findings CTA HEAD FINDINGS Anterior circulation: Intracranial internal carotid arteries are patent with calcified plaque causing mild stenosis. Anterior and middle cerebral arteries are patent. Posterior circulation: Intracranial vertebral arteries are patent with calcified plaque causing moderate stenosis on the left.  Basilar artery is patent. Posterior cerebral arteries are patent with bilateral posterior communicating arteries present. Venous sinuses: As permitted by contrast timing, patent. Review of the MIP images confirms the above findings CT Brain Perfusion Findings: CBF (<30%) Volume: 41m Perfusion (Tmax>6.0s) volume: 065mMismatch Volume: 58m76mnfarction Location: None IMPRESSION: No large vessel occlusion. No evidence of core infarction or territory at risk by perfusion imaging. Plaque at the proximal right ICA causes 75% stenosis. There is minimal stenosis of the left ICA origin. Electronically Signed   By: PraMacy MisD.   On: 10/07/2019 09:41   DG Chest 2 View  Result Date: 10/21/2019 CLINICAL DATA:  History of recent stroke with elevated white blood cell count, possible infiltrate EXAM: CHEST - 2 VIEW COMPARISON:  10/17/2019 FINDINGS: Cardiac shadow is enlarged but stable. Dialysis catheter is been removed in the interval. Right basilar atelectasis is seen stable from the previous exam. Interval clearing in the left base is seen. Small effusions are noted posteriorly. IMPRESSION: Small effusions with right basilar atelectasis. Electronically Signed   By: MarInez CatalinaD.   On: 10/21/2019 12:34   CT HEAD WO CONTRAST  Result Date: 10/18/2019 CLINICAL DATA:  Encephalopathy. Stent assisted angioplasty right ICA. EXAM: CT HEAD WITHOUT CONTRAST TECHNIQUE: Contiguous axial images were obtained from the base of the skull through the vertex without intravenous contrast. COMPARISON:  CT studies 10/07/2019.  MRI 10/07/2019. FINDINGS: Brain: No acute finding by CT. Low-density in the cerebral hemispheric white matter could be consistent with chronic small vessel disease, but is known based on the previous MRI to relate at least in part to more recent deep white matter infarctions. No new abnormality. No hemorrhage mass-effect, hydrocephalus or extra-axial collection. Vascular: There is atherosclerotic calcification  of the major vessels at the base of the brain. Skull: Negative Sinuses/Orbits: Clear/normal Other: None IMPRESSION: No acute finding by CT. Low-density in the cerebral hemispheric white matter appears similar to the previous CT studies and represents combination of chronic small vessel disease and more recent deep white matter infarctions. No sign of hemorrhage or mass effect. Electronically Signed   By: MarNelson ChimesD.   On: 10/18/2019 21:51   CT ANGIO NECK W OR WO CONTRAST  Result Date: 10/07/2019 CLINICAL DATA:  Code stroke, follow-up EXAM: CT ANGIOGRAPHY HEAD AND NECK CT PERFUSION BRAIN TECHNIQUE: Multidetector CT imaging of the head and neck was performed using the standard protocol during bolus administration of intravenous contrast. Multiplanar CT image reconstructions and MIPs were obtained to evaluate the vascular anatomy. Carotid stenosis measurements (when applicable) are obtained utilizing NASCET criteria, using the distal internal carotid diameter as the denominator. Multiphase CT imaging of the brain was performed  following IV bolus contrast injection. Subsequent parametric perfusion maps were calculated using RAPID software. CONTRAST:  133m OMNIPAQUE IOHEXOL 350 MG/ML SOLN COMPARISON:  None. FINDINGS: CTA NECK FINDINGS Aortic arch: Great vessel origins are patent. There is right origin of the left vertebral artery from the arch, with calcified plaque noted at the origin. Right carotid system: Patent. There is calcified and noncalcified plaque along the proximal ICA causing 75% stenosis. Left carotid system: Patent. There is calcified plaque at the ICA origin causing minimal stenosis. Vertebral arteries: Patent. Calcified plaque at both vertebral artery origins without evidence of flow limitation. Right vertebral artery is dominant. Skeleton: Degenerative changes. Other neck: No mass or adenopathy. Upper chest: No apical lung mass. Review of the MIP images confirms the above findings CTA HEAD  FINDINGS Anterior circulation: Intracranial internal carotid arteries are patent with calcified plaque causing mild stenosis. Anterior and middle cerebral arteries are patent. Posterior circulation: Intracranial vertebral arteries are patent with calcified plaque causing moderate stenosis on the left. Basilar artery is patent. Posterior cerebral arteries are patent with bilateral posterior communicating arteries present. Venous sinuses: As permitted by contrast timing, patent. Review of the MIP images confirms the above findings CT Brain Perfusion Findings: CBF (<30%) Volume: 013mPerfusion (Tmax>6.0s) volume: 69m17mismatch Volume: 69mL19mfarction Location: None IMPRESSION: No large vessel occlusion. No evidence of core infarction or territory at risk by perfusion imaging. Plaque at the proximal right ICA causes 75% stenosis. There is minimal stenosis of the left ICA origin. Electronically Signed   By: PranMacy Mis.   On: 10/07/2019 09:41   MR BRAIN WO CONTRAST  Result Date: 10/07/2019 CLINICAL DATA:  Initial evaluation for acute weakness, facial droop, slurred speech. Concern for stroke. EXAM: MRI HEAD WITHOUT CONTRAST TECHNIQUE: Multiplanar, multiecho pulse sequences of the brain and surrounding structures were obtained without intravenous contrast. COMPARISON:  Prior CTs from earlier the same day. FINDINGS: Brain: Moderately advanced age-related cerebral atrophy. Patchy T2/FLAIR hyperintensity within the periventricular white matter most consistent with chronic small vessel ischemic disease, mild in nature. Multifocal areas of restricted diffusion seen involving the periventricular/periatrial white matter of both cerebral hemispheres, consistent with acute ischemic small vessel type infarcts. Overall, infarct burden slightly worse on the right as compared to the left. For reference purposes, largest area of ischemia measures approximately 12 mm at the posterior right frontal periventricular white matter.  No associated hemorrhage or mass effect. No acute cortically based infarct. Gray-white matter differentiation otherwise maintained. No areas of remote cortical infarction. No acute intracranial hemorrhage. Single punctate chronic microhemorrhage noted at the right external capsule, likely related to small vessel disease. No other evidence for chronic intracranial hemorrhage. No mass lesion, midline shift or mass effect. No hydrocephalus. No extra-axial fluid collection. Incidental note made of a partially empty sella. Midline structures intact. Vascular: Major intracranial vascular flow voids are maintained. Skull and upper cervical spine: Craniocervical junction within normal limits. Bone marrow signal intensity normal. No scalp soft tissue abnormality. Sinuses/Orbits: Patient status post bilateral ocular lens replacement. Mild scattered mucosal thickening noted within the ethmoidal air cells. Paranasal sinuses are otherwise clear. No mastoid effusion. Inner ear structures grossly normal. Other: None. IMPRESSION: 1. 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 No associated hemorrhage or mass effect. 2. Underlying moderately advanced age-related cerebral atrophy with mild chronic small vessel ischemic disease. Electronically Signed   By: BenjJeannine Boga.   On: 10/07/2019 20:34  IR Fluoro Guide CV Line Right  Result Date: 10/08/2019 INDICATION: 79 year old with end-stage renal disease. Patient has a clotted left arm graft and will need dialysis prior to the declot procedure. EXAM: FLUOROSCOPIC AND ULTRASOUND GUIDED PLACEMENT OF A NON-TUNNELED DIALYSIS CATHETER Physician: Stephan Minister. Henn, MD MEDICATIONS: None ANESTHESIA/SEDATION: None FLUOROSCOPY TIME:  Fluoroscopy Time: 12 seconds, 2 mGy COMPLICATIONS: None immediate. PROCEDURE: The procedure was explained to the patient. The risks and  benefits of the procedure were discussed and the patient's questions were addressed. Informed consent was obtained from the patient. The patient was placed supine on the interventional table. Ultrasound confirmed a patent right internal jugular vein. Ultrasound images were obtained for documentation. The right neck was prepped and draped in a sterile fashion. The right neck was anesthetized with 1% lidocaine. Maximal barrier sterile technique was utilized including caps, mask, sterile gowns, sterile gloves, sterile drape, hand hygiene and skin antiseptic. A small incision was made with #11 blade scalpel. A 21 gauge needle directed into the right internal jugular vein with ultrasound guidance. A micropuncture dilator set was placed. A 16 cm Mahurkar catheter was selected. The catheter was advanced over a wire and positioned at the superior cavoatrial junction. Fluoroscopic images were obtained for documentation. Both dialysis lumens were found to aspirate and flush well. The proper amount of heparin was flushed in both lumens. The central venous lumen was flushed with normal saline. Catheter was sutured to skin. FINDINGS: Catheter tip at the superior cavoatrial junction. IMPRESSION: Successful placement of a right jugular non-tunneled dialysis catheter using ultrasound and fluoroscopic guidance. Electronically Signed   By: Markus Daft M.D.   On: 10/08/2019 17:18   IR INTRAVSC STENT CERV CAROTID W/EMB-PROT MOD SED  Result Date: 10/19/2019 CLINICAL DATA:  History of left sided weakness involving the arm and leg secondary to right cerebral hemispheric subcortical ischemic changes with discovery of a high-grade 75% stenosis of the right internal carotid artery proximally. EXAM: INTRAVSC STENT CERV CAROTID W/ EMB-PROT COMPARISON:  CT angiogram of the head and neck of October 07, 2019. MEDICATIONS: Heparin 4,000 units IV; Ancef 2 g IV antibiotic was administered within 1 hour of the procedure. ANESTHESIA/SEDATION:  General anesthesia as patient was unable to lay still with sedation. CONTRAST:  Isovue 300 approximately 85 mL. FLUOROSCOPY TIME:  Fluoroscopy Time: 28 minutes 0 seconds (1948 mGy). COMPLICATIONS: None immediate. TECHNIQUE: Informed written consent was obtained from the patient after a thorough discussion of the procedural risks, benefits and alternatives. All questions were addressed. Maximal Sterile Barrier Technique was utilized including caps, mask, sterile gowns, sterile gloves, sterile drape, hand hygiene and skin antiseptic. A timeout was performed prior to the initiation of the procedure. The right groin was prepped and draped in the usual sterile fashion. Thereafter using modified Seldinger technique, transfemoral access into the right common femoral artery was obtained without difficulty. Over a 0.035 inch guidewire, a 5 French Pinnacle sheath was inserted. Through this, and also over 0.035 inch guidewire, a 5 [place] JB 1 catheter was advanced to the aortic arch region and selectively positioned in the innominate artery, right common carotid artery, and left common carotid artery. FINDINGS: The innominate artery arteriogram demonstrates wide patency of the right subclavian artery and the right common carotid artery proximally. The origin of the right vertebral artery appears mildly narrowed. More distally, the vessel is seen to opacify to the cranial skull base to the level of the right posterior-inferior cerebellar artery. The right common carotid arteriogram demonstrates the right external carotid  artery and its major branches to be widely patent. The right internal carotid artery on the AP projection demonstrates approximately 75% stenosis secondary to a smooth segmental plaque. More distally, there is mild post stenotic dilatation. The distal right ICA petrous cavernous and supraclinoid segments are widely patent. There is mild narrowing of the proximal right middle cerebral M1 segment. The right MCA  distally and the right anterior cerebral artery opacify into the capillary and venous. There is prompt transient opacification via the anterior communicating artery of the left anterior cerebral A2 segment and distally. The left common carotid arteriogram demonstrates the left external carotid artery and its major branches to be widely patent. The left internal carotid artery at the bulb inferiorly demonstrates a smooth shallow plaque without evidence of intraluminal filling defects or of aspirations. More distally, the left internal carotid artery opacifies to the cranial skull base. The petrous, cavernous and the supraclinoid segments are widely patent. The left posterior communicating artery demonstrates partial transverse filling of the left posterior cerebral artery distribution. The left middle and the left anterior cerebral artery opacify into the capillary and venous phases. 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 The guidewire was removed. Good aspiration was obtained from the hub of the balloon guide catheter. Gentle contrast injection demonstrated no evidence of spasms, dissections or of intraluminal filling defects distal to the tip of the balloon guide. Measurements were performed of the right internal carotid artery in its normal diameter portion proximally and also distally and also of the right common carotid artery. A 4-7 mm NAV Emboshield device was then flushed with heparinized saline infusion to clear the filter and of the air, and also the of the delivery microcatheter. The filter was then captured into the  delivery device in the usual manner. The combination of the delivery microcatheter, with the filter and the delivery 014 inch micro guidewire was then retrieved in a J configuration given to the micro guidewire. Thereafter, using biplane roadmap technique and constant fluoroscopic guidance in a coaxial manner and with constant heparinized saline infusion the filter delivery microcatheter with the micro guidewire were then advanced to the right internal carotid artery. Using a torque device, the micro guidewire was advanced without difficulty followed by the filter catheter delivery device to the distal end of the right internal carotid artery. The micro guidewire was positioned in the horizontal segment of the petrous portion. The filter device was then deployed in the usual manner, the filter delivery microcatheter was retrieved and removed with the filter kept in stable position with intermittent fluoroscopy. A control arteriogram performed through the 8 French balloon guide in the right common carotid artery demonstrated safe position of the tip of the micro guidewire, and also the filter. No evidence of dissections or of intraluminal filling defects or vasospasm was seen. A 6 mm/8 mm x 40 mm Xact stent delivery system was then retrogradely prepped with heparinized saline infusion. Thereafter using the rapid exchange technique, the stent delivery apparatus was then advanced without difficulty over the micro guidewire. The proximal and the distal landing zones were then positioned adequate distant from the site of the severe stenosis. The stent  was then deployed in the usual manner without difficulty. The delivery apparatus was retrieved and removed. A control arteriogram performed through the 8 [place] FlowGate guide catheter in the right common carotid artery demonstrated now significantly improved caliber and flow through the stented segment. There continued to be a waist of this stent which necessitated the  advancement of a 5 mm x 30 mm Viatrac 14 angioplasty balloon microcatheter which had been prepped with heparinized saline infusion. Again using the rapid exchange technique, the angioplasty balloon was advanced and positioned adequate distant from the site of the waist. A control inflation was then performed using a micro inflation syringe device via micro tubing. Balloon was maintained for approximately 10 seconds. Thereafter, the balloon was deflated and retrieved and removed. A control arteriogram performed through the [facility] guide catheter in the right common carotid artery demonstrates significant improved caliber and flow through the stented segment. There continued to be a small dog ear at the distal end of the stent. However, this remained stable throughout the procedure. Control arteriograms were then performed at 15 and 30 minutes post angioplasty which continued to demonstrate excellent flow through the stented segment, and also intracranially without evidence of a filling defects or of occlusions. In view of the patient's long segment stenosis, the patient was given a total of 10.5 mg of intra-arterial Integrilin to obviate any complication of platelets at this site within the stent. Also, the patient's ACT was maintained in the region of approximately 200 seconds throughout the procedure. The filter was then captured using the filter capture device again using the rapid exchange technique. Control arteriogram performed through the [facility] shuttle sheath in the right common carotid artery demonstrates significantly improved caliber and flow through the stented segment of the right intracranial proximally without evidence of intra stent irregularities or filling defects. Placement of a second stent in the distal first stent was not undertaken in view of the increased risk of possible inducing platelet aggregation. The balloon guide was then retrieved and removed. The 8 French Pinnacle sheath was then  removed with the successful application of an 8 French Angio-Seal closure device for hemostasis in the right groin. Distal pulses remained Dopplerable in the posterior tibial, and the dorsalis pedis arteries bilaterally unchanged. A flat panel CT of the brain demonstrated no evidence of intracranial hemorrhage or mass effect. The patient's general anesthesia was then reversed, and the patient was then extubated without difficulty. The patient was slow in awakening from the general anesthetic. Eventually, however, the patient was able to respond appropriately and demonstrated orientation to place and year. He denied any headaches or nausea or vomiting. He was able to move all his 4 extremities without difficulty. He was then transferred to the neuro PACU and then neuro ICU for overnight close neurologic observations, continue on the low-dose IV heparin, and also management of blood pressure. Overnight, the patient's neurological status remained stable. The following morning, the IV heparin was stopped and the patient was switched to Brilinta 90 mg b.i.d., with aspirin 85 mg a day. The patient was advised to sit up and attempt eating his breakfast. The right groin appeared soft with distal pulses remaining Dopplerable unchanged bilaterally. Instructions were given to the patient to continue with his dual medications. A follow-up in the clinic in 4 weeks will be undertaken with a probable ultrasound of the neck to evaluate the carotid stent status approximately 3 months from the time of the treatment. The patient expressed understanding. The patient was deemed to  be stable medically to be transferred to the primary service. 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 PLAN: Follow-up in the clinic 4 weeks post discharge, with ultrasound of the neck in 3 months from the  time of the treatment. Electronically Signed   By: Luanne Bras M.D.   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  EXAM: INTRAVSC STENT CERV CAROTID W/ EMB-PROT  COMPARISON:  CT angiogram of the head and neck of October 07, 2019.  MEDICATIONS: Heparin 4,000 units IV; Ancef 2 g IV antibiotic was administered within 1 hour of the procedure.  ANESTHESIA/SEDATION: General anesthesia as patient was unable to lay still with sedation.  CONTRAST:  Isovue 300 approximately 85 mL.  FLUOROSCOPY TIME:  Fluoroscopy Time: 28 minutes 0 seconds (1948 mGy).  COMPLICATIONS: None immediate.  TECHNIQUE: Informed written consent was obtained from the patient after a thorough discussion of the procedural risks, benefits and alternatives. All questions were addressed. Maximal Sterile Barrier Technique was utilized including caps, mask, sterile gowns, sterile gloves, sterile drape, hand hygiene and skin antiseptic. A timeout was performed prior to the initiation of the procedure.  The right groin was prepped and draped in the usual sterile fashion. Thereafter using modified Seldinger technique, transfemoral access into the right common femoral artery was obtained without difficulty. Over a 0.035 inch guidewire, a 5 French Pinnacle sheath was inserted. Through this, and also over 0.035 inch guidewire, a 5 [place] JB 1 catheter was advanced to the aortic arch region and selectively positioned in the innominate artery, right common carotid artery, and left common carotid artery.  FINDINGS: The innominate artery arteriogram demonstrates wide patency of the right subclavian artery and the right common carotid artery proximally. The origin of the right vertebral artery appears  mildly narrowed. More distally, the vessel is seen to opacify to the cranial skull base to the level of the right posterior-inferior cerebellar artery.  The right common carotid arteriogram demonstrates the right external carotid artery and its major branches to be widely patent.  The right internal carotid artery on the AP projection demonstrates approximately 75% stenosis secondary to a smooth segmental plaque. More distally, there is mild post stenotic dilatation. The distal right ICA petrous cavernous and supraclinoid segments are widely patent.  There is mild narrowing of the proximal right middle cerebral M1 segment. The right MCA distally and the right anterior cerebral artery opacify into the capillary and venous.  There is prompt transient opacification via the anterior communicating artery of the left anterior cerebral A2 segment and distally.  The left common carotid arteriogram demonstrates the left external carotid artery and its major branches to be widely patent.  The left internal carotid artery at the bulb inferiorly demonstrates a smooth shallow plaque without evidence of intraluminal filling defects or of aspirations. More distally, the left internal carotid artery opacifies to the cranial skull base. The petrous, cavernous and the supraclinoid segments are widely patent.  The left posterior communicating artery demonstrates partial transverse filling of the left posterior cerebral artery distribution.  The left middle and the left anterior cerebral artery opacify into the capillary and venous phases.  ENDOVASCULAR REVASCULARIZATION OF SYMPTOMATIC HIGH-GRADE STENOSIS  OF THE RIGHT INTERNAL CAROTID ARTERY PROXIMALLY WITH STENT ASSISTED ANGIOPLASTY WITH DISTAL PROTECTION  The diagnostic JB 1 catheter in the right common carotid artery was then exchanged over an 8 French Pinnacle sheath in the right groin for an 8 French 85 cm FlowGate balloon guide catheter which had been prepped with 50%  contrast and 50% heparinized saline infusion.  The guidewire was removed. Good aspiration was obtained from the hub of the balloon guide catheter. Gentle contrast injection demonstrated no evidence of spasms, dissections or of intraluminal filling defects distal to the tip of the balloon guide.  Measurements were performed of the right internal carotid artery in its normal diameter portion proximally and also distally and also of the right common carotid artery.  A 4-7 mm NAV Emboshield device was then flushed with heparinized saline infusion to clear the filter and of the air, and also the of the delivery microcatheter. The filter was then captured into the delivery device in the usual manner. The combination of the delivery microcatheter, with the filter and the delivery 014 inch micro guidewire was then retrieved in a J configuration given to the micro guidewire.  Thereafter, using biplane roadmap technique and constant fluoroscopic guidance in a coaxial manner and with constant heparinized saline infusion the filter delivery microcatheter with the micro guidewire were then advanced to the right internal carotid artery.  Using a torque device, the micro guidewire was advanced without difficulty followed by the filter catheter delivery device to the distal end of the right internal carotid artery. The micro guidewire was positioned in the horizontal segment of the petrous portion.  The filter device was then deployed in the usual manner, the filter delivery microcatheter was retrieved and removed with the filter kept in stable position with intermittent fluoroscopy.  A control arteriogram performed through the 8 French balloon guide in the right common carotid artery demonstrated safe position of the tip of the micro guidewire, and also the filter. No evidence of dissections or of intraluminal filling defects or vasospasm was seen.  A 6 mm/8 mm x 40 mm Xact stent delivery system was then retrogradely  prepped with heparinized saline infusion.  Thereafter using the rapid exchange technique, the stent delivery apparatus was then advanced without difficulty over the micro guidewire. The proximal and the distal landing zones were then positioned adequate distant from the site of the severe stenosis. The stent was then deployed in the usual manner without difficulty. The delivery apparatus was retrieved and removed. A control arteriogram performed through the 8 [place] FlowGate guide catheter in the right common carotid artery demonstrated now significantly improved caliber and flow through the stented segment. There continued to be a waist of this stent which necessitated the advancement of a 5 mm x 30 mm Viatrac 14 angioplasty balloon microcatheter which had been prepped with heparinized saline infusion. Again using the rapid exchange technique, the angioplasty balloon was advanced and positioned adequate distant from the site of the waist. A control inflation was then performed using a micro inflation syringe device via micro tubing. Balloon was maintained for approximately 10 seconds. Thereafter, the balloon was deflated and retrieved and removed. A control arteriogram performed through the [facility] guide catheter in the right common carotid artery demonstrates significant improved caliber and flow through the stented segment.  There continued to be a small dog ear at the distal end of the stent. However, this remained stable throughout the procedure.  Control arteriograms were then performed at 15 and 30 minutes  post angioplasty which continued to demonstrate excellent flow through the stented segment, and also intracranially without evidence of a filling defects or of occlusions. In view of the patient's long segment stenosis, the patient was given a total of 10.5 mg of intra-arterial Integrilin to obviate any complication of platelets at this site within the stent.  Also, the patient's ACT was maintained in  the region of approximately 200 seconds throughout the procedure.  The filter was then captured using the filter capture device again using the rapid exchange technique.  Control arteriogram performed through the [facility] shuttle sheath in the right common carotid artery demonstrates significantly improved caliber and flow through the stented segment of the right intracranial proximally without evidence of intra stent irregularities or filling defects.  Placement of a second stent in the distal first stent was not undertaken in view of the increased risk of possible inducing platelet aggregation.  The balloon guide was then retrieved and removed. The 8 French Pinnacle sheath was then removed with the successful application of an 8 French Angio-Seal closure device for hemostasis in the right groin. Distal pulses remained Dopplerable in the posterior tibial, and the dorsalis pedis arteries bilaterally unchanged.  A flat panel CT of the brain demonstrated no evidence of intracranial hemorrhage or mass effect. The  patient's general anesthesia was then reversed, and the patient was then extubated without difficulty. The patient was slow in awakening from the general anesthetic. Eventually, however, the patient was able to respond appropriately and demonstrated orientation to place and year.  He denied any headaches or nausea or vomiting.  He was able to move all his 4 extremities without difficulty. He was then transferred to the neuro PACU and then neuro ICU for overnight close neurologic observations, continue on the low-dose IV heparin, and also management of blood pressure.  Overnight, the patient's neurological status remained stable. The following morning, the IV heparin was stopped and the patient was switched to Brilinta 90 mg b.i.d., with aspirin 85 mg a day. The patient was advised to sit up and attempt eating his breakfast.  The right groin appeared soft with distal pulses remaining Dopplerable  unchanged bilaterally.  Instructions were given to the patient to continue with his dual medications. A follow-up in the clinic in 4 weeks will be undertaken with a probable ultrasound of the neck to evaluate the carotid stent status approximately 3 months from the time of the treatment.  The patient expressed understanding. The patient was deemed to be stable medically to be transferred to the primary service.  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  PLAN: Follow-up in the clinic 4 weeks post discharge, with ultrasound of the neck in 3 months from the time of the treatment.   Electronically Signed   By: Luanne Bras M.D.   On: 10/16/2019 09:31   IR US Guide Vasc Access Left  Result Date: 10/09/2019 INDICATION: Clotted graft. History of end-stage renal disease currently receiving dialysis via left upper extremity dialysis graft created approximately 6 months ago. Patient report, patient underwent declot procedure approximately 3 months ago. Patient underwent placement of a temporary right jugular approach dialysis catheter yesterday, 10/08/2019 for urgent dialysis and returns today for attempted image guided left upper extremity dialysis graft thrombolysis. EXAM: 1. FISTULALYSIS 2. ANGIOPLASTY OF VENOUS LIMB AND VENOUS ANASTOMOSIS 3. ULTRASOUND GUIDANCE FOR VENOUS ACCESS COMPARISON:  Image guided right non tunneled dialysis catheter  placement-10/08/2019. MEDICATIONS: Heparin 4000 units IV; TPA 4 mg into graft. CONTRAST:  35 cc Omnipaque 3 ANESTHESIA/SEDATION: Moderate (conscious) sedation was employed during this procedure. A total of Versed 2 mg and Fentanyl 100 mcg was administered intravenously. Moderate Sedation Time: 40 minutes. The patient's level of consciousness and vital signs were monitored continuously by radiology nursing throughout  the procedure under my direct supervision. FLUOROSCOPY TIME:  4 minutes, 48 seconds (44 mGy) COMPLICATIONS: None immediate. TECHNIQUE: Informed written consent was obtained from the patient after a discussion of the risks, benefits and alternatives to treatment. Questions regarding the procedure were encouraged and answered. A timeout was performed prior to the initiation of the procedure. On physical examination, the existing left arm dialysis graft was negative for palpable pulse or thrill. The skin overlying the graft was prepped and draped in the usual sterile fashion, and a sterile drape was applied covering the operative field. Maximum barrier sterile technique with sterile gowns and gloves were used for the procedure. Under ultrasound guidance, the dialysis graft was accessed directed towards the venous anastomosis with a micropuncture kit after the overlying soft tissues were anesthetized with 1% lidocaine. An ultrasound image was saved for documentation purposes. The micropuncture sheath was exchange for a 7-French vascular sheath over a guidewire. Over a Benson wire, a Kumpe catheter was advanced centrally and a central venogram was performed. Pull back venogram was performed with the Kumpe catheter. Heparin was administered systemically and TPA was administered via the Kumpe catheter throughout near the entirety of the venous limb. The venous anastomosis and majority of the venous limb was angioplastied with a 6 mm x 4 cm Conquest balloon. An additional access was obtained directed towards the arterial anastomoses with a micropuncture kit after the overlying soft tissues anesthetized with 1% lidocaine. This allowed for placement of a 6-French vascular sheath. The graft was thrombectomized with several rounds of push-pull mechanical thrombectomy with an occlusion balloon. Flow was restored to the graft as evidenced by restored pulsatility of the graft and brisk blood return from the side arm of the  vascular sheath. Shuntograms were performed. Small area of adherent thrombus within the venous stick zone of the upper arm dialysis graft was angioplastied at multiple stations with a 7 mm x 4 cm Conquest balloon. Completion images were obtained. At this point, the procedure was terminated. All wires, catheters and sheaths were removed from the patient. Hemostasis was achieved at both access sites with deployment of a swizzle sutures which will be removed at the patient's next dialysis session. Dressings were placed. The patient tolerated the procedure without immediate postprocedural complication. FINDINGS: The existing left upper extremity AV graft is thrombosed. The venous anastomosis and near the entirety of the venous limb was angioplastied to 6 mm diameter. The graft was successfully thrombectomized using mechanical and pharmacologic means as above. Completion shuntogram demonstrates the venous limb and anastomosis are widely patent. The central venous system, arterial limb and arterial anastomosis are widely patent. IMPRESSION: 1. Technically successful left AV graft thrombolysis. 2. Successful angioplasty of the venous anastomosis and majority of the venous limb to 6 mm diameter. Completion shuntogram demonstrates the venous limb and anastomosis are widely patent. 3. The arterial anastomosis and arterial limb are widely patent. 4. The central venous system is widely patent. ACCESS: This graft would be amenable to future percutaneous intervention as clinically indicated. Electronically Signed   By: Sandi Mariscal M.D.   On: 10/09/2019 17:32   IR US Guide Vasc Access Right  Result  Date: 10/08/2019 INDICATION: 79 year old with end-stage renal disease. Patient has a clotted left arm graft and will need dialysis prior to the declot procedure. EXAM: FLUOROSCOPIC AND ULTRASOUND GUIDED PLACEMENT OF A NON-TUNNELED DIALYSIS CATHETER Physician: Stephan Minister. Henn, MD MEDICATIONS: None ANESTHESIA/SEDATION: None FLUOROSCOPY  TIME:  Fluoroscopy Time: 12 seconds, 2 mGy COMPLICATIONS: None immediate. PROCEDURE: The procedure was explained to the patient. The risks and benefits of the procedure were discussed and the patient's questions were addressed. Informed consent was obtained from the patient. The patient was placed supine on the interventional table. Ultrasound confirmed a patent right internal jugular vein. Ultrasound images were obtained for documentation. The right neck was prepped and draped in a sterile fashion. The right neck was anesthetized with 1% lidocaine. Maximal barrier sterile technique was utilized including caps, mask, sterile gowns, sterile gloves, sterile drape, hand hygiene and skin antiseptic. A small incision was made with #11 blade scalpel. A 21 gauge needle directed into the right internal jugular vein with ultrasound guidance. A micropuncture dilator set was placed. A 16 cm Mahurkar catheter was selected. The catheter was advanced over a wire and positioned at the superior cavoatrial junction. Fluoroscopic images were obtained for documentation. Both dialysis lumens were found to aspirate and flush well. The proper amount of heparin was flushed in both lumens. The central venous lumen was flushed with normal saline. Catheter was sutured to skin. FINDINGS: Catheter tip at the superior cavoatrial junction. IMPRESSION: Successful placement of a right jugular non-tunneled dialysis catheter using ultrasound and fluoroscopic guidance. Electronically Signed   By: Markus Daft M.D.   On: 10/08/2019 17:18   DG CHEST PORT 1 VIEW  Result Date: 10/17/2019 CLINICAL DATA:  End-stage renal disease. EXAM: PORTABLE CHEST 1 VIEW COMPARISON:  October 07, 2019. FINDINGS: Stable cardiomediastinal silhouette. No pneumothorax is noted. Mild bibasilar atelectasis or infiltrates are noted. Right internal jugular catheter is noted with distal tip in expected position of the SVC. Bony thorax is unremarkable. IMPRESSION: Mild bibasilar  subsegmental atelectasis or infiltrates. Electronically Signed   By: Marijo Conception M.D.   On: 10/17/2019 13:18   DG Chest Portable 1 View  Result Date: 10/07/2019 CLINICAL DATA:  Pt from home with daughter LSN yesterday at 1500. Daughter saw pt this morning slumped over in a chair to the right side. Pt has been increasingly weak over the past week. EXAM: PORTABLE CHEST 1 VIEW COMPARISON:  01/23/2019 FINDINGS: There are low lung volumes. Mild linear opacity noted at the right lung base consistent with atelectasis. Lungs otherwise clear. No pleural effusion or pneumothorax. Cardiac silhouette is normal in size. No mediastinal or hilar masses. Skeletal structures are grossly intact. IMPRESSION: No acute cardiopulmonary disease. Electronically Signed   By: Lajean Manes M.D.   On: 10/07/2019 09:57   IR THROMBECTOMY AV FISTULA W/THROMBOLYSIS/PTA INC/SHUNT/IMG LEFT  Result Date: 10/09/2019 INDICATION: Clotted graft. History of end-stage renal disease currently receiving dialysis via left upper extremity dialysis graft created approximately 6 months ago. Patient report, patient underwent declot procedure approximately 3 months ago. Patient underwent placement of a temporary right jugular approach dialysis catheter yesterday, 10/08/2019 for urgent dialysis and returns today for attempted image guided left upper extremity dialysis graft thrombolysis. EXAM: 1. FISTULALYSIS 2. ANGIOPLASTY OF VENOUS LIMB AND VENOUS ANASTOMOSIS 3. ULTRASOUND GUIDANCE FOR VENOUS ACCESS COMPARISON:  Image guided right non tunneled dialysis catheter placement-10/08/2019. MEDICATIONS: Heparin 4000 units IV; TPA 4 mg into graft. CONTRAST:  35 cc Omnipaque 3 ANESTHESIA/SEDATION: Moderate (conscious) sedation was employed during this  procedure. A total of Versed 2 mg and Fentanyl 100 mcg was administered intravenously. Moderate Sedation Time: 40 minutes. The patient's level of consciousness and vital signs were monitored continuously by  radiology nursing throughout the procedure under my direct supervision. FLUOROSCOPY TIME:  4 minutes, 48 seconds (44 mGy) COMPLICATIONS: None immediate. TECHNIQUE: Informed written consent was obtained from the patient after a discussion of the risks, benefits and alternatives to treatment. Questions regarding the procedure were encouraged and answered. A timeout was performed prior to the initiation of the procedure. On physical examination, the existing left arm dialysis graft was negative for palpable pulse or thrill. The skin overlying the graft was prepped and draped in the usual sterile fashion, and a sterile drape was applied covering the operative field. Maximum barrier sterile technique with sterile gowns and gloves were used for the procedure. Under ultrasound guidance, the dialysis graft was accessed directed towards the venous anastomosis with a micropuncture kit after the overlying soft tissues were anesthetized with 1% lidocaine. An ultrasound image was saved for documentation purposes. The micropuncture sheath was exchange for a 7-French vascular sheath over a guidewire. Over a Benson wire, a Kumpe catheter was advanced centrally and a central venogram was performed. Pull back venogram was performed with the Kumpe catheter. Heparin was administered systemically and TPA was administered via the Kumpe catheter throughout near the entirety of the venous limb. The venous anastomosis and majority of the venous limb was angioplastied with a 6 mm x 4 cm Conquest balloon. An additional access was obtained directed towards the arterial anastomoses with a micropuncture kit after the overlying soft tissues anesthetized with 1% lidocaine. This allowed for placement of a 6-French vascular sheath. The graft was thrombectomized with several rounds of push-pull mechanical thrombectomy with an occlusion balloon. Flow was restored to the graft as evidenced by restored pulsatility of the graft and brisk blood return  from the side arm of the vascular sheath. Shuntograms were performed. Small area of adherent thrombus within the venous stick zone of the upper arm dialysis graft was angioplastied at multiple stations with a 7 mm x 4 cm Conquest balloon. Completion images were obtained. At this point, the procedure was terminated. All wires, catheters and sheaths were removed from the patient. Hemostasis was achieved at both access sites with deployment of a swizzle sutures which will be removed at the patient's next dialysis session. Dressings were placed. The patient tolerated the procedure without immediate postprocedural complication. FINDINGS: The existing left upper extremity AV graft is thrombosed. The venous anastomosis and near the entirety of the venous limb was angioplastied to 6 mm diameter. The graft was successfully thrombectomized using mechanical and pharmacologic means as above. Completion shuntogram demonstrates the venous limb and anastomosis are widely patent. The central venous system, arterial limb and arterial anastomosis are widely patent. IMPRESSION: 1. Technically successful left AV graft thrombolysis. 2. Successful angioplasty of the venous anastomosis and majority of the venous limb to 6 mm diameter. Completion shuntogram demonstrates the venous limb and anastomosis are widely patent. 3. The arterial anastomosis and arterial limb are widely patent. 4. The central venous system is widely patent. ACCESS: This graft would be amenable to future percutaneous intervention as clinically indicated. Electronically Signed   By: Sandi Mariscal M.D.   On: 10/09/2019 17:32   ECHOCARDIOGRAM COMPLETE  Result Date: 10/07/2019   ECHOCARDIOGRAM REPORT   Patient Name:   Frank [name] Date of Exam: 10/07/2019 Medical Rec #:  [id number]  Height:       67.0 in Accession #:    [id number]      Weight:       204.4 lb Date of Birth:  [birth year]       BSA:          2.04 m Patient Age:    79 years        BP:           129/96  mmHg Patient Gender: M               HR:           77 bpm. Exam Location:  Inpatient Procedure: 2D Echo, Cardiac Doppler and Color Doppler Indications:    CVA  History:        Patient has prior history of Echocardiogram examinations, most                 recent 01/22/2019. Stroke and PAD; Risk Factors:Hypertension,                 Current Smoker and Diabetes. ESRD.  Sonographer:    Dustin Flock Referring Phys: [place]  1. Left ventricular ejection fraction, by visual estimation, is 60 to 65%. The left ventricle has normal function. There is moderately increased left ventricular hypertrophy.  2. Left ventricular diastolic parameters are indeterminate.  3. The left ventricle has no regional wall motion abnormalities.  4. Global right ventricle has mildly reduced systolic function.The right ventricular size is normal. No increase in right ventricular wall thickness.  5. Left atrial size was normal.  6. Right atrial size was normal.  7. The mitral valve is normal in structure. Trivial mitral valve regurgitation.  8. The tricuspid valve is normal in structure.  9. The tricuspid valve is normal in structure. Tricuspid valve regurgitation is trivial. 10. The aortic valve is tricuspid. Aortic valve regurgitation is not visualized. Mild to moderate aortic valve sclerosis/calcification without any evidence of aortic stenosis. 11. The pulmonic valve was grossly normal. Pulmonic valve regurgitation is not visualized. 12. Moderately elevated pulmonary artery systolic pressure. 13. No intracardiac source of embolism detected on this transthoracic study. A transesophageal echocardiogram is recommended to exclude cardiac source of embolism if clinically indicated. 14. The atrial septum is grossly normal. FINDINGS  Left Ventricle: Left ventricular ejection fraction, by visual estimation, is 60 to 65%. The left ventricle has normal function. The left ventricle has no regional wall motion abnormalities. There  is moderately increased left ventricular hypertrophy. Concentric left ventricular hypertrophy. Left ventricular diastolic parameters are indeterminate. Right Ventricle: The right ventricular size is normal. No increase in right ventricular wall thickness. Global RV systolic function is has mildly reduced systolic function. The tricuspid regurgitant velocity is 3.49 m/s, and with an assumed right atrial pressure of 8 mmHg, the estimated right ventricular systolic pressure is moderately elevated at 56.7 mmHg. Left Atrium: Left atrial size was normal in size. Right Atrium: Right atrial size was normal in size Pericardium: There is no evidence of pericardial effusion. Mitral Valve: The mitral valve is normal in structure. There is mild thickening of the mitral valve leaflet(s). There is mild calcification of the mitral valve leaflet(s). Trivial mitral valve regurgitation. Tricuspid Valve: The tricuspid valve is normal in structure. Tricuspid valve regurgitation is trivial. Aortic Valve: The aortic valve is tricuspid. . There is mild thickening and moderate calcification of the aortic valve. Aortic valve regurgitation is not visualized. Mild to moderate aortic valve sclerosis/calcification is present,  without any evidence of aortic stenosis. There is mild thickening of the aortic valve. There is moderate calcification of the aortic valve. Pulmonic Valve: The pulmonic valve was grossly normal. Pulmonic valve regurgitation is not visualized. Pulmonic regurgitation is not visualized. Aorta: The aortic root and ascending aorta are structurally normal, with no evidence of dilitation. Pulmonary Artery: The pulmonary artery is not well seen. Venous: The inferior vena cava was not well visualized. IAS/Shunts: The atrial septum is grossly normal. Additional Comments: No intracardiac source of embolism detected on this transthoracic study. A transesophageal echocardiogram is recommended to exclude cardiac source of embolism if  clinically indicated.  LEFT VENTRICLE PLAX 2D LVIDd:         3.93 cm  Diastology LVIDs:         2.59 cm  LV e' lateral:   18.40 cm/s LV PW:         1.51 cm  LV E/e' lateral: 3.5 LV IVS:        1.69 cm  LV e' medial:    14.50 cm/s LVOT diam:     2.50 cm  LV E/e' medial:  4.4 LV SV:         43 ml LV SV Index:   20.12 LVOT Area:     4.91 cm  RIGHT VENTRICLE RV Basal diam:  3.00 cm RV S prime:     9.90 cm/s TAPSE (M-mode): 1.1 cm LEFT ATRIUM             Index       RIGHT ATRIUM           Index LA diam:        4.30 cm 2.11 cm/m  RA Area:     18.70 cm LA Vol (A2C):   37.0 ml 18.13 ml/m RA Volume:   46.30 ml  22.69 ml/m LA Vol (A4C):   52.0 ml 25.48 ml/m LA Biplane Vol: 44.3 ml 21.71 ml/m  AORTIC VALVE LVOT Vmax:   84.90 cm/s LVOT Vmean:  57.100 cm/s LVOT VTI:    0.158 m  AORTA Ao Root diam: 3.50 cm MITRAL VALVE                        TRICUSPID VALVE MV Area (PHT): 3.54 cm             TR Peak grad:   48.7 mmHg MV PHT:        62.06 msec           TR Vmax:        349.00 cm/s MV Decel Time: 214 msec MV E velocity: 63.70 cm/s 103 cm/s  SHUNTS MV A velocity: 90.00 cm/s 70.3 cm/s Systemic VTI:  0.16 m MV E/A ratio:  0.71       1.5       Systemic Diam: 2.50 cm  Buford Dresser MD Electronically signed by Buford Dresser MD Signature Date/Time: 10/07/2019/3:31:07 PM    Final    CT HEAD CODE STROKE WO CONTRAST  Result Date: 10/07/2019 CLINICAL DATA:  Code stroke. Focal neuro deficit, greater than 6 hours, stroke suspected, aphasia and facial droop. EXAM: CT HEAD WITHOUT CONTRAST TECHNIQUE: Contiguous axial images were obtained from the base of the skull through the vertex without intravenous contrast. COMPARISON:  No pertinent prior studies available for comparison. FINDINGS: Brain: No evidence of acute intracranial hemorrhage. No demarcated cortical infarction. No evidence of intracranial mass. No midline shift or extra-axial fluid collection. Mild generalized  parenchymal atrophy. Vascular: No hyperdense  vessel.  Atherosclerotic calcifications. Skull: Normal. Negative for fracture or focal lesion. Sinuses/Orbits: Visualized orbits demonstrate no acute abnormality. No significant paranasal sinus disease or mastoid effusion at the imaged levels. These results were communicated to Dr. Rory Percy at [place] 1/27/2021by text page via the [facility] messaging system. IMPRESSION: No CT evidence of acute intracranial abnormality. Mild generalized parenchymal atrophy. Electronically Signed   By: Kellie Simmering DO   On: 10/07/2019 09:16   CT CEREBRAL PERFUSION W CM (CHARM STUDY)  Result Date: 10/07/2019 CLINICAL DATA:  Code stroke, follow-up EXAM: CT ANGIOGRAPHY HEAD AND NECK CT PERFUSION BRAIN TECHNIQUE: Multidetector CT imaging of the head and neck was performed using the standard protocol during bolus administration of intravenous contrast. Multiplanar CT image reconstructions and MIPs were obtained to evaluate the vascular anatomy. Carotid stenosis measurements (when applicable) are obtained utilizing NASCET criteria, using the distal internal carotid diameter as the denominator. Multiphase CT imaging of the brain was performed following IV bolus contrast injection. Subsequent parametric perfusion maps were calculated using RAPID software. CONTRAST:  168m OMNIPAQUE IOHEXOL 350 MG/ML SOLN COMPARISON:  None. FINDINGS: CTA NECK FINDINGS Aortic arch: Great vessel origins are patent. There is right origin of the left vertebral artery from the arch, with calcified plaque noted at the origin. Right carotid system: Patent. There is calcified and noncalcified plaque along the proximal ICA causing 75% stenosis. Left carotid system: Patent. There is calcified plaque at the ICA origin causing minimal stenosis. Vertebral arteries: Patent. Calcified plaque at both vertebral artery origins without evidence of flow limitation. Right vertebral artery is dominant. Skeleton: Degenerative changes. Other neck: No mass or adenopathy. Upper chest:  No apical lung mass. Review of the MIP images confirms the above findings CTA HEAD FINDINGS Anterior circulation: Intracranial internal carotid arteries are patent with calcified plaque causing mild stenosis. Anterior and middle cerebral arteries are patent. Posterior circulation: Intracranial vertebral arteries are patent with calcified plaque causing moderate stenosis on the left. Basilar artery is patent. Posterior cerebral arteries are patent with bilateral posterior communicating arteries present. Venous sinuses: As permitted by contrast timing, patent. Review of the MIP images confirms the above findings CT Brain Perfusion Findings: CBF (<30%) Volume: 094mPerfusion (Tmax>6.0s) volume: 34m36mismatch Volume: 34mL88mfarction Location: None IMPRESSION: No large vessel occlusion. No evidence of core infarction or territory at risk by perfusion imaging. Plaque at the proximal right ICA causes 75% stenosis. There is minimal stenosis of the left ICA origin. Electronically Signed   By: PranMacy Mis.   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  EXAM: INTRAVSC STENT CERV CAROTID W/ EMB-PROT  COMPARISON:  CT angiogram of the head and neck of October 07, 2019.  MEDICATIONS: Heparin 4,000 units IV; Ancef 2 g IV antibiotic was administered within 1 hour of the procedure.  ANESTHESIA/SEDATION: General anesthesia as patient was unable to lay still with sedation.  CONTRAST:  Isovue 300 approximately 85 mL.  FLUOROSCOPY TIME:  Fluoroscopy Time: 28 minutes 0 seconds (1948 mGy).  COMPLICATIONS: None immediate.  TECHNIQUE: Informed written consent was obtained from the patient after a thorough discussion of the  procedural risks, benefits and alternatives. All questions were addressed. Maximal Sterile  Barrier Technique was utilized including caps, mask, sterile gowns, sterile gloves, sterile drape, hand hygiene and skin antiseptic. A timeout was performed prior to the initiation of the procedure.  The right groin was prepped and draped in the usual sterile fashion. Thereafter using modified Seldinger technique, transfemoral access into the right common femoral artery was obtained without difficulty. Over a 0.035 inch guidewire, a 5 French Pinnacle sheath was inserted. Through this, and also over 0.035 inch guidewire, a 5 [place] JB 1 catheter was advanced to the aortic arch region and selectively positioned in the innominate artery, right common carotid artery, and left common carotid artery.  FINDINGS: The innominate artery arteriogram demonstrates wide patency of the right subclavian artery and the right common carotid artery proximally. The origin of the right vertebral artery appears mildly narrowed. More distally, the vessel is seen to opacify to the cranial skull base to the level of the right posterior-inferior cerebellar artery.  The right common carotid arteriogram demonstrates the right external carotid artery and its major branches to be widely patent.  The right internal carotid artery on the AP projection demonstrates approximately 75% stenosis secondary to a smooth segmental plaque. More distally, there is mild post stenotic dilatation. The distal right ICA petrous cavernous and supraclinoid segments are widely patent.  There is mild narrowing of the proximal right middle cerebral M1 segment. The right MCA distally and the right anterior cerebral artery opacify into the capillary and venous.  There is prompt transient opacification via the anterior communicating artery of the left anterior cerebral A2 segment and distally.  The left common carotid arteriogram demonstrates the left external carotid artery  and its major branches to be widely patent.  The left internal carotid artery at the bulb inferiorly demonstrates a smooth shallow plaque without evidence of intraluminal filling defects or of aspirations. More distally, the left internal carotid artery opacifies to the cranial skull base. The petrous, cavernous and the supraclinoid segments are widely patent.  The left posterior communicating artery demonstrates partial transverse filling of the left posterior cerebral artery distribution.  The left middle and the left anterior cerebral artery opacify into the capillary and venous phases.  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  The guidewire was removed. Good aspiration was obtained from the hub of the balloon guide catheter. Gentle contrast injection demonstrated no evidence of spasms, dissections or of intraluminal filling defects distal to the tip of the balloon guide.  Measurements were performed of the right internal carotid artery in its normal diameter portion proximally and also distally and also of the right common carotid artery.  A 4-7 mm NAV Emboshield device was then flushed with heparinized saline infusion to clear the filter and of the air, and also the of the delivery microcatheter. The filter was then captured into the delivery device in the usual manner. The combination of the delivery microcatheter, with the filter and the delivery 014 inch micro guidewire was then retrieved in a J configuration given to the micro guidewire.  Thereafter, using biplane roadmap technique and constant fluoroscopic  guidance in a coaxial manner and with constant heparinized saline infusion the filter delivery  microcatheter with the micro guidewire were then advanced to the right internal carotid artery.  Using a torque device, the micro guidewire was advanced without difficulty followed by the filter catheter delivery device to the distal end of the right internal carotid artery. The micro guidewire was positioned in the horizontal segment of the petrous portion.  The filter device was then deployed in the usual manner, the filter delivery microcatheter was retrieved and removed with the filter kept in stable position with intermittent fluoroscopy.  A control arteriogram performed through the 8 French balloon guide in the right common carotid artery demonstrated safe position of the tip of the micro guidewire, and also the filter. No evidence of dissections or of intraluminal filling defects or vasospasm was seen.  A 6 mm/8 mm x 40 mm Xact stent delivery system was then retrogradely prepped with heparinized saline infusion.  Thereafter using the rapid exchange technique, the stent delivery apparatus was then advanced without difficulty over the micro guidewire. The proximal and the distal landing zones were then positioned adequate distant from the site of the severe stenosis. The stent was then deployed in the usual manner without difficulty. The delivery apparatus was retrieved and removed. A control arteriogram performed through the 8 [place] FlowGate guide catheter in the right common carotid artery demonstrated now significantly improved caliber and flow through the stented segment. There continued to be a waist of this stent which necessitated the advancement of a 5 mm x 30 mm Viatrac 14 angioplasty balloon microcatheter which had been prepped with heparinized saline infusion. Again using the rapid exchange technique, the angioplasty balloon was advanced and positioned adequate distant from the site of the waist. A  control inflation was then performed using a micro inflation syringe device via micro tubing. Balloon was maintained for approximately 10 seconds. Thereafter, the balloon was deflated and retrieved and removed. A control arteriogram performed through the [facility] guide catheter in the right common carotid artery demonstrates significant improved caliber and flow through the stented segment.  There continued to be a small dog ear at the distal end of the stent. However, this remained stable throughout the procedure.  Control arteriograms were then performed at 15 and 30 minutes post angioplasty which continued to demonstrate excellent flow through the stented segment, and also intracranially without evidence of a filling defects or of occlusions. In view of the patient's long segment stenosis, the patient was given a total of 10.5 mg of intra-arterial Integrilin to obviate any complication of platelets at this site within the stent.  Also, the patient's ACT was maintained in the region of approximately 200 seconds throughout the procedure.  The filter was then captured using the filter capture device again using the rapid exchange technique.  Control arteriogram performed through the [facility] shuttle sheath in the right common carotid artery demonstrates significantly improved caliber and flow through the stented segment of the right intracranial proximally without evidence of intra stent irregularities or filling defects.  Placement of a second stent in the distal first stent was not undertaken in view of the increased risk of possible inducing platelet aggregation.  The balloon guide was then retrieved and removed. The 8 French Pinnacle sheath was then removed with the successful application of an 8 French Angio-Seal closure device for hemostasis in the right groin. Distal pulses remained Dopplerable in the posterior tibial, and the dorsalis pedis arteries bilaterally unchanged.  A flat panel CT of the brain  demonstrated no evidence of intracranial hemorrhage or mass effect.  The  patient's general anesthesia was then reversed, and the patient was then extubated without difficulty. The patient was slow in awakening from the general anesthetic. Eventually, however, the patient was able to respond appropriately and demonstrated orientation to place and year.  He denied any headaches or nausea or vomiting.  He was able to move all his 4 extremities without difficulty. He was then transferred to the neuro PACU and then neuro ICU for overnight close neurologic observations, continue on the low-dose IV heparin, and also management of blood pressure.  Overnight, the patient's neurological status remained stable. The following morning, the IV heparin was stopped and the patient was switched to Brilinta 90 mg b.i.d., with aspirin 85 mg a day. The patient was advised to sit up and attempt eating his breakfast.  The right groin appeared soft with distal pulses remaining Dopplerable unchanged bilaterally.  Instructions were given to the patient to continue with his dual medications. A follow-up in the clinic in 4 weeks will be undertaken with a probable ultrasound of the neck to evaluate the carotid stent status approximately 3 months from the time of the treatment.  The patient expressed understanding. The patient was deemed to be stable medically to be transferred to the primary service.  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  PLAN: Follow-up in the clinic 4 weeks post discharge, with ultrasound of the neck in 3 months from the time of the treatment.   Electronically Signed   By: Luanne Bras M.D.   On: 10/16/2019 09:31   IR ANGIO VERTEBRAL SEL SUBCLAVIAN INNOMINATE UNI R MOD SED  Result Date: 10/15/2019 CLINICAL DATA:  History of left sided weakness  involving the arm and leg secondary to right cerebral hemispheric subcortical ischemic changes with discovery of a high-grade 75% stenosis of the right internal carotid artery proximally.  EXAM: INTRAVSC STENT CERV CAROTID W/ EMB-PROT  COMPARISON:  CT angiogram of the head and neck of October 07, 2019.  MEDICATIONS: Heparin 4,000 units IV; Ancef 2 g IV antibiotic was administered within 1 hour of the procedure.  ANESTHESIA/SEDATION: General anesthesia as patient was unable to lay still with sedation.  CONTRAST:  Isovue 300 approximately 85 mL.  FLUOROSCOPY TIME:  Fluoroscopy Time: 28 minutes 0 seconds (1948 mGy).  COMPLICATIONS: None immediate.  TECHNIQUE: Informed written consent was obtained from the patient after a thorough discussion of the procedural risks, benefits and alternatives. All questions were addressed. Maximal Sterile Barrier Technique was utilized including caps, mask, sterile gowns, sterile gloves, sterile drape, hand hygiene and skin antiseptic. A timeout was performed prior to the initiation of the procedure.  The right groin was prepped and draped in the usual sterile fashion. Thereafter using modified Seldinger technique, transfemoral access into the right common femoral artery was obtained without difficulty. Over a 0.035 inch guidewire, a 5 French Pinnacle sheath was inserted. Through this, and also over 0.035 inch guidewire, a 5 [place] JB 1 catheter was advanced to the aortic arch region and selectively positioned in the innominate artery, right common carotid artery, and left common carotid artery.  FINDINGS: The innominate artery arteriogram demonstrates wide patency of the right subclavian artery and the right common carotid artery proximally. The origin of the right vertebral artery appears mildly narrowed. More distally, the vessel is seen to opacify to the cranial skull base to the level of the right posterior-inferior cerebellar artery.  The right  common carotid arteriogram  demonstrates the right external carotid artery and its major branches to be widely patent.  The right internal carotid artery on the AP projection demonstrates approximately 75% stenosis secondary to a smooth segmental plaque. More distally, there is mild post stenotic dilatation. The distal right ICA petrous cavernous and supraclinoid segments are widely patent.  There is mild narrowing of the proximal right middle cerebral M1 segment. The right MCA distally and the right anterior cerebral artery opacify into the capillary and venous.  There is prompt transient opacification via the anterior communicating artery of the left anterior cerebral A2 segment and distally.  The left common carotid arteriogram demonstrates the left external carotid artery and its major branches to be widely patent.  The left internal carotid artery at the bulb inferiorly demonstrates a smooth shallow plaque without evidence of intraluminal filling defects or of aspirations. More distally, the left internal carotid artery opacifies to the cranial skull base. The petrous, cavernous and the supraclinoid segments are widely patent.  The left posterior communicating artery demonstrates partial transverse filling of the left posterior cerebral artery distribution.  The left middle and the left anterior cerebral artery opacify into the capillary and venous phases.  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  The guidewire was removed. Good aspiration was obtained from the hub of the balloon guide catheter. Gentle contrast injection demonstrated no  evidence of spasms, dissections or of intraluminal filling defects distal to the tip of the balloon guide.  Measurements were performed of the right internal carotid artery in its normal diameter portion proximally and also distally and also of the right common carotid artery.  A 4-7 mm NAV Emboshield device was then flushed with heparinized saline infusion to clear the filter and of the air, and also the of the delivery microcatheter. The filter was then captured into the delivery device in the usual manner. The combination of the delivery microcatheter, with the filter and the delivery 014 inch micro guidewire was then retrieved in a J configuration given to the micro guidewire.  Thereafter, using biplane roadmap technique and constant fluoroscopic guidance in a coaxial manner and with constant heparinized saline infusion the filter delivery microcatheter with the micro guidewire were then advanced to the right internal carotid artery.  Using a torque device, the micro guidewire was advanced without difficulty followed by the filter catheter delivery device to the distal end of the right internal carotid artery. The micro guidewire was positioned in the horizontal segment of the petrous portion.  The filter device was then deployed in the usual manner, the filter delivery microcatheter was retrieved and removed with the filter kept in stable position with intermittent fluoroscopy.  A control arteriogram performed through the 8 French balloon guide in the right common carotid artery demonstrated safe position of the tip of the micro guidewire, and also the filter. No evidence of dissections or of intraluminal filling defects or vasospasm was seen.  A 6 mm/8 mm x 40 mm Xact stent delivery system was then retrogradely prepped with heparinized saline infusion.  Thereafter using the rapid exchange technique, the stent delivery apparatus was then advanced without  difficulty over the micro guidewire. The proximal  and the distal landing zones were then positioned adequate distant from the site of the severe stenosis. The stent was then deployed in the usual manner without difficulty. The delivery apparatus was retrieved and removed. A control arteriogram performed through the 8 [place] FlowGate guide catheter in the right common carotid artery demonstrated now significantly improved caliber and flow through the stented segment. There continued to be a waist of this stent which necessitated the advancement of a 5 mm x 30 mm Viatrac 14 angioplasty balloon microcatheter which had been prepped with heparinized saline infusion. Again using the rapid exchange technique, the angioplasty balloon was advanced and positioned adequate distant from the site of the waist. A control inflation was then performed using a micro inflation syringe device via micro tubing. Balloon was maintained for approximately 10 seconds. Thereafter, the balloon was deflated and retrieved and removed. A control arteriogram performed through the [facility] guide catheter in the right common carotid artery demonstrates significant improved caliber and flow through the stented segment.  There continued to be a small dog ear at the distal end of the stent. However, this remained stable throughout the procedure.  Control arteriograms were then performed at 15 and 30 minutes post angioplasty which continued to demonstrate excellent flow through the stented segment, and also intracranially without evidence of a filling defects or of occlusions. In view of the patient's long segment stenosis, the patient was given a total of 10.5 mg of intra-arterial Integrilin to obviate any complication of platelets at this site within the stent.  Also, the patient's ACT was maintained in the region of approximately 200 seconds throughout the procedure.  The filter was then captured using the filter capture device again using the rapid exchange technique.  Control arteriogram  performed through the [facility] shuttle sheath in the right common carotid artery demonstrates significantly improved caliber and flow through the stented segment of the right intracranial proximally without evidence of intra stent irregularities or filling defects.  Placement of a second stent in the distal first stent was not undertaken in view of the increased risk of possible inducing platelet aggregation.  The balloon guide was then retrieved and removed. The 8 French Pinnacle sheath was then removed with the successful application of an 8 French Angio-Seal closure device for hemostasis in the right groin. Distal pulses remained Dopplerable in the posterior tibial, and the dorsalis pedis arteries bilaterally unchanged.  A flat panel CT of the brain demonstrated no evidence of intracranial hemorrhage or mass effect. The  patient's general anesthesia was then reversed, and the patient was then extubated without difficulty. The patient was slow in awakening from the general anesthetic. Eventually, however, the patient was able to respond appropriately and demonstrated orientation to place and year.  He denied any headaches or nausea or vomiting.  He was able to move all his 4 extremities without difficulty. He was then transferred to the neuro PACU and then neuro ICU for overnight close neurologic observations, continue on the low-dose IV heparin, and also management of blood pressure.  Overnight, the patient's neurological status remained stable. The following morning, the IV heparin was stopped and the patient was switched to Brilinta 90 mg b.i.d., with aspirin 85 mg a day. The patient was advised to sit up and attempt eating his breakfast.  The right groin appeared soft with distal pulses remaining Dopplerable unchanged bilaterally.  Instructions were given to the patient to continue with his dual medications. A follow-up  in the clinic in 4 weeks will be undertaken with a probable ultrasound of the neck to  evaluate the carotid stent status approximately 3 months from the time of the treatment.  The patient expressed understanding. The patient was deemed to be stable medically to be transferred to the primary service.  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  PLAN: Follow-up in the clinic 4 weeks post discharge, with ultrasound of the neck in 3 months from the time of the treatment.   Electronically Signed   By: Luanne Bras M.D.   On: 10/16/2019 09:31    (Echo, Carotid, EGD, Colonoscopy, ERCP)    Subjective:   Discharge Exam: Vitals:   10/23/19 0351 10/23/19 0811  BP: (!) 154/65 (!) 122/51  Pulse: 83 79  Resp: 17 19  Temp: 98.2 F (36.8 C) (!) 97.5 F (36.4 C)  SpO2: 98% 97%     General: Pt is alert, awake, not in acute distress Cardiovascular: RRR, S1/S2 +, no rubs, no gallops Respiratory: CTA bilaterally, no wheezing, no rhonchi Abdominal: Soft, NT, ND, bowel sounds + Extremities: no edema, no cyanosis    The results of significant diagnostics from this hospitalization (including imaging, microbiology, ancillary and laboratory) are listed below for reference.     Microbiology: Recent Results (from the past 240 hour(s))  Surgical pcr screen     Status: None   Collection Time: 10/15/19  5:15 AM   Specimen: Nasal Mucosa; Nasal Swab  Result Value Ref Range Status   MRSA, PCR NEGATIVE NEGATIVE Final   Staphylococcus aureus NEGATIVE NEGATIVE Final    Comment: (NOTE) The Xpert SA Assay (FDA approved for NASAL specimens in patients 63 years of age and older), is one component of a comprehensive surveillance program. It is not intended to diagnose infection nor to guide or monitor treatment. Performed at [facility], [place] [address], [place], [place] [address]      Labs: BNP (last 3 results) No results  for input(s): BNP in the last 8760 hours. Basic Metabolic Panel: Recent Labs  Lab 10/17/19 1122 10/17/19 1122 10/18/19 0446 10/18/19 0446 10/19/19 0126 10/20/19 0557 10/21/19 0255 10/21/19 1806 10/22/19 0228  NA 138   < > 137   < > 138 138 137 140 139  K 3.9   < > 3.5   < > 3.5 3.1* 3.3* 3.9 3.9  CL 97*   < > 97*   < > 97* 101 98 96* 99  CO2 21*   < > 25   < > _0 GLUCOSE 85   < > 141*   < > 124* 126* 127* 150* 131*  BUN 33*   < > 17   < > 24* 40* 23 31* 34*  CREATININE 6.11*   < > 4.12*   < > 5.74* 7.62* 5.11* 6.08* 6.77*  CALCIUM 8.8*   < > 8.8*   < > 8.8* 8.7* 8.9 9.5 9.2  MG  --   --  1.7  --   --   --   --  1.9  --   PHOS 5.7*  --   --   --   --   --   --   --   --    < > = values in this interval not displayed.   Liver Function Tests: Recent Labs  Lab 10/17/19 1122  ALBUMIN 3.0*   No results for input(s): LIPASE, AMYLASE in the last 168 hours. Recent Labs  Lab 10/18/19 1908  AMMONIA 24   CBC: Recent Labs  Lab 10/17/19 1122 10/18/19 0446 10/19/19 0126 10/19/19 0126 10/20/19 0557 10/21/19 0255 10/22/19 0228 10/22/19 1832 10/23/19 0213  WBC 8.9   < > 11.0*  --  12.0* 14.7* 11.9*  --  10.6*  NEUTROABS 7.2  --   --   --   --   --   --   --   --   HGB 10.9*   < > 11.3*   < > 10.5* 10.8* 10.7* 11.0* 10.0*  HCT 33.6*   < > 34.1*   < > 32.5* 34.0* 34.0* 34.2* 32.1*  MCV 99.7   < > 98.0  --  100.0 101.5* 100.9*  --  104.2*  PLT 259   < > 273  --  290 330 310  --  331   < > = values in this interval not displayed.   Cardiac Enzymes: No results for input(s): CKTOTAL, CKMB, CKMBINDEX, TROPONINI in the last 168 hours. BNP: Invalid input(s): POCBNP CBG: Recent Labs  Lab 10/22/19 0618 10/22/19 1231 10/22/19 1712 10/22/19 2121 10/23/19 0615  GLUCAP 116* 89 156* 106* 161*   D-Dimer No results for input(s): DDIMER in the last 72 hours. Hgb A1c No results for input(s): HGBA1C in the last 72 hours. Lipid Profile No results for input(s): CHOL,  HDL, LDLCALC, TRIG, CHOLHDL, LDLDIRECT in the last 72 hours. Thyroid function studies No results for input(s): TSH, T4TOTAL, T3FREE, THYROIDAB in the last 72 hours.  Invalid input(s): FREET3 Anemia work up No results for input(s): VITAMINB12, FOLATE, FERRITIN, TIBC, IRON, RETICCTPCT in the last 72 hours. Urinalysis    Component Value Date/Time   COLORURINE YELLOW 10/21/2019 1054   APPEARANCEUR CLEAR 10/21/2019 1054   APPEARANCEUR Turbid (A) 05/11/2019 1037   LABSPEC 1.006 10/21/2019 1054   PHURINE 8.0 10/21/2019 1054   GLUCOSEU 150 (A) 10/21/2019 1054   HGBUR SMALL (A) 10/21/2019 1054   BILIRUBINUR NEGATIVE 10/21/2019 1054   BILIRUBINUR Negative 05/11/2019 [place] 10/21/2019 1054   PROTEINUR 100 (A) 10/21/2019 1054   NITRITE NEGATIVE 10/21/2019 1054   LEUKOCYTESUR NEGATIVE 10/21/2019 1054   Sepsis Labs Invalid input(s): PROCALCITONIN,  WBC,  LACTICIDVEN Microbiology Recent Results (from the past 240 hour(s))  Surgical pcr screen     Status: None   Collection Time: 10/15/19  5:15 AM   Specimen: Nasal Mucosa; Nasal Swab  Result Value Ref Range Status   MRSA, PCR NEGATIVE NEGATIVE Final   Staphylococcus aureus NEGATIVE NEGATIVE Final    Comment: (NOTE) The Xpert SA Assay (FDA approved for NASAL specimens in patients 48 years of age and older), is one component of a comprehensive surveillance program. It is not intended to diagnose infection nor to guide or monitor treatment. Performed at [facility], [place] [address], [place], [place] [address]      Time coordinating discharge: 40 minutes  SIGNED:   Elmarie Shiley, MD  [facility]

## 2019-10-23 NOTE — Progress Notes (Addendum)
Progress Note  Patient Name: Frank [name] Date of Encounter: 10/23/2019  Primary Cardiologist: Thompson Grayer, MD   Subjective   Denies any dyspnea.  Oriented x2 today  Inpatient Medications    Scheduled Meds: . aspirin EC  81 mg Oral Daily  . atorvastatin  20 mg Oral q1800  . calcitRIOL  0.5 mcg Oral Q T,Th,Sa-HD  . Chlorhexidine Gluconate Cloth  6 each Topical Q0600  . Chlorhexidine Gluconate Cloth  6 each Topical Q0600  . Chlorhexidine Gluconate Cloth  6 each Topical Q0600  . clotrimazole   Topical BID  . feeding supplement (NEPRO CARB STEADY)  237 mL Oral TID WC  . insulin aspart  0-15 Units Subcutaneous TID WC  . insulin aspart  0-5 Units Subcutaneous QHS  . levothyroxine  175 mcg Oral QAC breakfast  . lidocaine  1 application Urethral Once  . midodrine  10 mg Oral TID WC  . multivitamin  1 tablet Oral QHS  . pantoprazole (PROTONIX) IV  40 mg Intravenous Q12H  . polyethylene glycol  17 g Oral Daily  . QUEtiapine  12.5 mg Oral QHS  . sevelamer carbonate  2,400 mg Oral TID WC  . sodium chloride flush  10-40 mL Intracatheter Q12H  . tamsulosin  0.4 mg Oral Daily  . ticagrelor  90 mg Oral BID   Continuous Infusions: . sodium chloride Stopped (10/22/19 1606)  . cefTRIAXone (ROCEPHIN)  IV Stopped (10/22/19 1329)   PRN Meds: acetaminophen **OR** acetaminophen (TYLENOL) oral liquid 160 mg/5 mL **OR** acetaminophen, bisacodyl, ketoconazole, OLANZapine zydis, sodium chloride flush   Vital Signs    Vitals:   10/22/19 2328 10/23/19 0150 10/23/19 0351 10/23/19 0811  BP: (!) 145/62  (!) 154/65 (!) 122/51  Pulse: 89  83 79  Resp: 18  17 19   Temp: 98.2 F (36.8 C)  98.2 F (36.8 C) (!) 97.5 F (36.4 C)  TempSrc: Oral  Oral Oral  SpO2: 97%  98% 97%  Weight:  95.2 kg    Height:        Intake/Output Summary (Last 24 hours) at 10/23/2019 0930 Last data filed at 10/23/2019 0500 Gross per 24 hour  Intake 580 ml  Output 0 ml  Net 580 ml   Last 3 Weights 10/23/2019  10/22/2019 10/21/2019  Weight (lbs) 209 lb 14.1 oz 210 lb 8.6 oz 214 lb 1.1 oz  Weight (kg) 95.2 kg 95.5 kg 97.1 kg      Telemetry    First degree AV block, occasional Wenkebach.  Does have periods of 2:1 block where rate is slower but appears to be asymptomatic  - Personally Reviewed  ECG    No new tracings - Personally Reviewed  Physical Exam   GEN: No acute distress.  Oriented x2 Neck: No JVD Cardiac: RRR, 2/6 SEM; no rubs or gallops.  Respiratory: Clear to auscultation bilaterally. GI: Soft, nontender, non-distended  MS: No edema; No deformity. Neuro:  Nonfocal  Psych: Normal affect   Labs    High Sensitivity Troponin:   Recent Labs  Lab 10/17/19 1612 10/17/19 1816  TROPONINIHS 74* 77*      Chemistry Recent Labs  Lab 10/17/19 1122 10/18/19 0446 10/21/19 0255 10/21/19 1806 10/22/19 0228  NA 138   < > 137 140 139  K 3.9   < > 3.3* 3.9 3.9  CL 97*   < > 98 96* 99  CO2 21*   < > 29 28 24   GLUCOSE 85   < > 127*  150* 131*  BUN 33*   < > 23 31* 34*  CREATININE 6.11*   < > 5.11* 6.08* 6.77*  CALCIUM 8.8*   < > 8.9 9.5 9.2  ALBUMIN 3.0*  --   --   --   --   GFRNONAA 8*   < > 10* 8* 7*  GFRAA 9*   < > 12* 9* 8*  ANIONGAP 20*   < > 10 16* 16*   < > = values in this interval not displayed.     Hematology Recent Labs  Lab 10/21/19 0255 10/21/19 0255 10/22/19 0228 10/22/19 1832 10/23/19 0213  WBC 14.7*  --  11.9*  --  10.6*  RBC 3.35*  --  3.37*  --  3.08*  HGB 10.8*   < > 10.7* 11.0* 10.0*  HCT 34.0*   < > 34.0* 34.2* 32.1*  MCV 101.5*  --  100.9*  --  104.2*  MCH 32.2  --  31.8  --  32.5  MCHC 31.8  --  31.5  --  31.2  RDW 13.2  --  13.3  --  13.2  PLT 330  --  310  --  331   < > = values in this interval not displayed.    BNPNo results for input(s): BNP, PROBNP in the last 168 hours.   DDimer No results for input(s): DDIMER in the last 168 hours.   Radiology    DG Chest 2 View  Result Date: 10/21/2019 CLINICAL DATA:  History of recent stroke  with elevated white blood cell count, possible infiltrate EXAM: CHEST - 2 VIEW COMPARISON:  10/17/2019 FINDINGS: Cardiac shadow is enlarged but stable. Dialysis catheter is been removed in the interval. Right basilar atelectasis is seen stable from the previous exam. Interval clearing in the left base is seen. Small effusions are noted posteriorly. IMPRESSION: Small effusions with right basilar atelectasis. Electronically Signed   By: Inez Catalina M.D.   On: 10/21/2019 12:34    Cardiac Studies   TTE 10/07/19:  1. Left ventricular ejection fraction, by visual estimation, is 60 to  65%. The left ventricle has normal function. There is moderately increased  left ventricular hypertrophy.  2. Left ventricular diastolic parameters are indeterminate.  3. The left ventricle has no regional wall motion abnormalities.  4. Global right ventricle has mildly reduced systolic function.The right  ventricular size is normal. No increase in right ventricular wall  thickness.  5. Left atrial size was normal.  6. Right atrial size was normal.  7. The mitral valve is normal in structure. Trivial mitral valve  regurgitation.  8. The tricuspid valve is normal in structure.  9. The tricuspid valve is normal in structure. Tricuspid valve  regurgitation is trivial.  10. The aortic valve is tricuspid. Aortic valve regurgitation is not  visualized. Mild to moderate aortic valve sclerosis/calcification without  any evidence of aortic stenosis.  11. The pulmonic valve was grossly normal. Pulmonic valve regurgitation is  not visualized.  12. Moderately elevated pulmonary artery systolic pressure.  13. No intracardiac source of embolism detected on this transthoracic  study. A transesophageal echocardiogram is recommended to exclude cardiac  source of embolism if clinically indicated.  14. The atrial septum is grossly normal.   Patient Profile     79 y.o. 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  No significant pauses, sustained bradycardia or Mobitz II heart block. -remains no indication for pacemaker  -continue to monitor on telemetry    Bigeminy: has been having increased PVC frequency with episodes of bigeminy. Would check K+, Mag, replete for goal K>4, Mag>2.    ST elevation: telemetry called with concern of worsening ST elevation in V2.  EKG confirms isolated V2 ST elevation.  Patient is asymptomatic with unremarkable troponins.  No further work-up recommended.  For questions or updates, please contact [place] Please consult [email or web address] for contact info under      Signed, Donato Heinz, MD  10/23/2019, 9:30 AM

## 2019-10-23 NOTE — TOC Progression Note (Signed)
Transition of Care [facility]) - Progression Note    Patient Details  Name: Frank [name] MRN: [id number] Date of Birth: [birth year]  Transition of Care [facility]) CM/SW [place], [place] Phone Number: 10/23/2019, 4:10 PM  Clinical Narrative:   CSW following for discharge plan. CSW spoke with MD this morning with update that patient is stable, but no COVID test was collected; patient will need COVID test prior to discharge to SNF. COVID test ordered this morning, still not resulted. CSW also made sure [facility] a bed available for patient to admit, and confirmed that they could take him tomorrow with the DC summary completed today. CSW coordinated with patient's daughter, [name], to ensure that she had all of her questions answered by [place], and in agreement with discharge tomorrow. CSW coordinated with Renal Navigator to ensure that patient's home clinic was notified that patient will discharge the hospital and return to his home clinic on Tuesday, 2/16 at 10:00 am. [facility] to provide transportation.   Patient will dialyze in the hospital on Saturday morning and can then discharge to [facility], if [place] test has resulted. CSW to follow.    Expected Discharge Plan: [place] Barriers to Discharge: Continued Medical Work up  Expected Discharge Plan and Services Expected Discharge Plan: [place] Choice: [place] arrangements for the past 2 months: Single Family Home Expected Discharge Date: 10/23/19                                     Social Determinants of Health (SDOH) Interventions    Readmission Risk Interventions No flowsheet data found.

## 2019-10-23 NOTE — TOC Progression Note (Signed)
Transition of Care [facility]) - Progression Note    Patient Details  Name: Frank [name] MRN: [id number] Date of Birth: [birth year]  Transition of Care [facility]) CM/SW [place], [place] Phone Number: 10/23/2019, 12:25 PM  Clinical Narrative:   CSW met with patient's daughter, [name], at bedside to explain to patient that he will be going to SNF at DC. CSW informed patient of the plan for DC, and patient was able to repeat back that the plan was to go to [facility] when he was ready to DC. Patient said he was ready to go now, but CSW said the doctor wasn't quite ready to let him go just yet. CSW answered patient's questions, and then met with daughter when PT arrived to work with patient. CSW answered daughter's questions about insurance coverage and expectations for SNF stay, and provided name and number of the Admissions liaison at [facility] for her to call with specific questions. CSW to follow, unsure when patient will be stable for DC.     Expected Discharge Plan: [place] Barriers to Discharge: Continued Medical Work up  Expected Discharge Plan and Services Expected Discharge Plan: [place] Choice: [place] arrangements for the past 2 months: Single Family Home Expected Discharge Date: 10/23/19                                     Social Determinants of Health (SDOH) Interventions    Readmission Risk Interventions No flowsheet data found.

## 2019-10-23 NOTE — Progress Notes (Signed)
Subjective:  Seen in room, going home today  Objective Vital signs in last 24 hours: Vitals:   10/22/19 2328 10/23/19 0150 10/23/19 0351 10/23/19 0811  BP: (!) 145/62  (!) 154/65 (!) 122/51  Pulse: 89  83 79  Resp: 18  17 19   Temp: 98.2 F (36.8 C)  98.2 F (36.8 C) (!) 97.5 F (36.4 C)  TempSrc: Oral  Oral Oral  SpO2: 97%  98% 97%  Weight:  95.2 kg    Height:       Weight change: -1.9 kg  Physical Exam General:NAD, WDWN Heart:RRR no rub or mur  Lungs:CTAB, nml WOB Abdomen:soft, NTND Extremities:no LE edema Dialysis Access: LU AVG  Patent on HD HD   Dialysis:  TTS [facility]   4h  92kg   2/2.25 bath  LUA AVG  Hep 5000 Calcitriol0.46mcg PO qHD  Assessment/Plan: 1. 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 Per neuro.S/p ICA stent on 2/4, brilinta BID.Ok for heparin. Possible dc to SNF today.  2. ESRD-HD TTS.Had S/p temp cath 1/28 and AVG declot 1/29.  Using AVG successfully now, temp cath dc'd.  HD yesterday here.  3. Hypotension/volume- BP's good. Not able to UF yest d/t BP drops. Wts here 3kg up but may have gained body wt. Were unable to pull vol any lower here.  Midodrine ^'d here from 10mg  pre HD TTS to 10 mg tid.  4. Anemiaof CKD- Hgb 11.2.>10.7  No indication for ESA at this time. 5. Secondary Hyperparathyroidism -P 5.7  renvela 3 TID AC - continue VDRA.   Ca corec mild ^ monitor    6. Nutrition- Renal diet w/fluid /mutivit - add nepro  7. DM- per primary 8. Hypothyroidism -on levothyroxine 9. Dysphagia - on regular renal diet 10. UTI/urethritis: cipro 11. Urinary retention: hx BPH, flomax held when in ICU w/ low BP's. Flomax resumed. Needs follow up with urology and voiding trial.  12. Dispo: ?dc to snf today  Kelly Splinter, MD 10/23/2019, 12:31 PM      Labs: Basic Metabolic Panel: Recent Labs  Lab  10/17/19 1122 10/18/19 0446 10/21/19 1806 10/22/19 0228 10/23/19 1016  NA 138   < > 140 139 139  K 3.9   < > 3.9 3.9 4.2  CL 97*   < > 96* 99 101  CO2 21*   < > 28 24 28   GLUCOSE 85   < > 150* 131* 153*  BUN 33*   < > 31* 34* 21  CREATININE 6.11*   < > 6.08* 6.77* 5.45*  CALCIUM 8.8*   < > 9.5 9.2 9.2  PHOS 5.7*  --   --   --   --    < > = values in this interval not displayed.   Liver Function Tests: Recent Labs  Lab 10/17/19 1122  ALBUMIN 3.0*   No results for input(s): LIPASE, AMYLASE in the last 168 hours. Recent Labs  Lab 10/18/19 1908  AMMONIA 24   CBC: Recent Labs  Lab 10/17/19 1122 10/18/19 0446 10/19/19 0126 10/19/19 0126 10/20/19 0557 10/20/19 0557 10/21/19 0255 10/21/19 0255 10/22/19 0228 10/22/19 1832 10/23/19 0213  WBC 8.9   < > 11.0*   < > 12.0*   < > 14.7*  --  11.9*  --  10.6*  NEUTROABS 7.2  --   --   --   --   --   --   --   --   --   --  HGB 10.9*   < > 11.3*   < > 10.5*   < > 10.8*   < > 10.7* 11.0* 10.0*  HCT 33.6*   < > 34.1*   < > 32.5*   < > 34.0*   < > 34.0* 34.2* 32.1*  MCV 99.7   < > 98.0  --  100.0  --  101.5*  --  100.9*  --  104.2*  PLT 259   < > 273   < > 290   < > 330  --  310  --  331   < > = values in this interval not displayed.   Cardiac Enzymes: No results for input(s): CKTOTAL, CKMB, CKMBINDEX, TROPONINI in the last 168 hours. CBG: Recent Labs  Lab 10/22/19 0618 10/22/19 1231 10/22/19 1712 10/22/19 2121 10/23/19 0615  GLUCAP 116* 89 156* 106* 161*    Studies/Results: DG Chest 2 View  Result Date: 10/21/2019 CLINICAL DATA:  History of recent stroke with elevated white blood cell count, possible infiltrate EXAM: CHEST - 2 VIEW COMPARISON:  10/17/2019 FINDINGS: Cardiac shadow is enlarged but stable. Dialysis catheter is been removed in the interval. Right basilar atelectasis is seen stable from the previous exam. Interval clearing in the left base is seen. Small effusions are noted posteriorly. IMPRESSION: Small  effusions with right basilar atelectasis. Electronically Signed   By: Inez Catalina M.D.   On: 10/21/2019 12:34   Medications: . sodium chloride Stopped (10/22/19 1606)  . cefTRIAXone (ROCEPHIN)  IV Stopped (10/22/19 1329)   . aspirin EC  81 mg Oral Daily  . atorvastatin  20 mg Oral q1800  . calcitRIOL  0.5 mcg Oral Q T,Th,Sa-HD  . Chlorhexidine Gluconate Cloth  6 each Topical Q0600  . Chlorhexidine Gluconate Cloth  6 each Topical Q0600  . Chlorhexidine Gluconate Cloth  6 each Topical Q0600  . clotrimazole   Topical BID  . feeding supplement (NEPRO CARB STEADY)  237 mL Oral TID WC  . insulin aspart  0-15 Units Subcutaneous TID WC  . insulin aspart  0-5 Units Subcutaneous QHS  . levothyroxine  175 mcg Oral QAC breakfast  . lidocaine  1 application Urethral Once  . midodrine  10 mg Oral TID WC  . multivitamin  1 tablet Oral QHS  . pantoprazole (PROTONIX) IV  40 mg Intravenous Q12H  . polyethylene glycol  17 g Oral Daily  . QUEtiapine  12.5 mg Oral QHS  . sevelamer carbonate  2,400 mg Oral TID WC  . sodium chloride flush  10-40 mL Intracatheter Q12H  . tamsulosin  0.4 mg Oral Daily  . ticagrelor  90 mg Oral BID

## 2019-10-23 NOTE — Progress Notes (Signed)
Occupational Therapy Treatment Patient Details Name: Frank [name] MRN: [id number] DOB: 14-Jul-1941 Today's Date: 10/23/2019    History of present illness 79 yo admitted with slurred speech and facial droop with multifocal watershed infarcts. PMhx: ESRD, DM, HTN, HLD, neuropathy, PAD, depression, gout   OT comments  Pt making progress with functional goals. Session focused on bed mobility, sit - stand to [place] as precursor to [facility] transfers, UB and LB ADLs seated at EOB. Pt requires multi modal cues for safety, sequencing, task initiation and continuation. OT will continue to follow acutely  Follow Up Recommendations  SNF;Supervision/Assistance - 24 hour    Equipment Recommendations  3 in 1 bedside commode;Other (comment)(TBD at SNF)    Recommendations for Other Services      Precautions / Restrictions Precautions Precautions: Fall Precaution Comments: pt very anxious about falling Restrictions Weight Bearing Restrictions: No       Mobility Bed Mobility Overal bed mobility: Needs Assistance Bed Mobility: Supine to Sit;Sit to Supine     Supine to sit: Mod assist Sit to supine: Mod assist   General bed mobility comments: mod A using rail to scoot to HOB while seated EOB  Transfers Overall transfer level: Needs assistance Equipment used: Rolling walker (2 wheeled) Transfers: Sit to/from Stand Sit to Stand: Mod assist         General transfer comment: mod A to power up from bed, cues for correct hand placement    Balance Overall balance assessment: Needs assistance;History of Falls Sitting-balance support: No upper extremity supported;Feet supported Sitting balance-Leahy Scale: Fair Sitting balance - Comments: min - min guard A for balance/support at EOB   Standing balance support: During functional activity;Bilateral upper extremity supported Standing balance-Leahy Scale: Poor                             ADL either performed or assessed with clinical  judgement   ADL Overall ADL's : Needs assistance/impaired     Grooming: Wash/dry face;Oral care;Standing;Cueing for sequencing;Cueing for safety;Minimal assistance Grooming Details (indicate cue type and reason): pt easily distracted, redirection required for task continuation Upper Body Bathing: Min guard;Sitting   Lower Body Bathing: Moderate assistance;Sitting/lateral leans Lower Body Bathing Details (indicate cue type and reason): simulated Upper Body Dressing : Min guard;Sitting Upper Body Dressing Details (indicate cue type and reason): doffed dirty gown, donned clean gown seated EOB     Toilet Transfer: Moderate assistance;RW;Cueing for safety;Cueing for sequencing;Minimal assistance;Ambulation;+2 for safety/equipment Toilet Transfer Details (indicate cue type and reason): simulated sit - stand with RW from bed to [place] and Hygiene: Sit to/from stand;Total assistance       Functional mobility during ADLs: Moderate assistance;Rolling walker;Cueing for sequencing;Cueing for safety General ADL Comments: pt requires multi modal cues for safety, sequencing, task initiation and continuation     Vision Patient Visual Report: No change from baseline     Perception     Praxis      Cognition Arousal/Alertness: Awake/alert Behavior During Therapy: Anxious;WFL for tasks assessed/performed Overall Cognitive Status: Impaired/Different from baseline Area of Impairment: Attention;Problem solving;Safety/judgement;Following commands;Memory                     Memory: Decreased short-term memory Following Commands: Follows one step commands with increased time;Follows multi-step commands inconsistently Safety/Judgement: Decreased awareness of deficits;Decreased awareness of safety   Problem Solving: Slow processing;Requires verbal cues;Requires tactile cues General Comments: Pt remains tangential with conversation.  He remains very anxious  during        Exercises     Shoulder Instructions       General Comments      Pertinent Vitals/ Pain       Pain Assessment: No/denies pain Pain Score: 0-No pain Pain Intervention(s): Monitored during session;Repositioned  Home Living                                          Prior Functioning/Environment              Frequency  Min 2X/week        Progress Toward Goals  OT Goals(current goals can now be found in the care plan section)  Progress towards OT goals: Progressing toward goals     Plan Discharge plan remains appropriate    Co-evaluation                 AM-PAC OT "6 Clicks" Daily Activity     Outcome Measure   Help from another person eating meals?: A Little Help from another person taking care of personal grooming?: A Little Help from another person toileting, which includes using toliet, bedpan, or urinal?: A Lot Help from another person bathing (including washing, rinsing, drying)?: A Lot Help from another person to put on and taking off regular upper body clothing?: None Help from another person to put on and taking off regular lower body clothing?: A Lot 6 Click Score: 16    End of Session Equipment Utilized During Treatment: Gait belt;Rolling walker  OT Visit Diagnosis: Unsteadiness on feet (R26.81);Other abnormalities of gait and mobility (R26.89);Muscle weakness (generalized) (M62.81);Other symptoms and signs involving cognitive function   Activity Tolerance Patient tolerated treatment well   Patient Left with call bell/phone within reach;in bed;with bed alarm set   Nurse Communication          Time: [id number] OT Time Calculation (min): 17 min  Charges: OT General Charges $OT Visit: 1 Visit OT Treatments $Self Care/Home Management : 8-22 mins     Britt Bottom 10/23/2019, 2:39 PM

## 2019-10-23 NOTE — Progress Notes (Signed)
  Speech Language Pathology Treatment: Dysphagia  Patient Details Name: Frank [name] MRN: [id number] DOB: [birth year] Today's Date: 10/23/2019 Time: 1005-1016 SLP Time Calculation (min) (ACUTE ONLY): 11 min  Assessment / Plan / Recommendation Clinical Impression  Pt received sitting upright in bed, dentures in place, breathing on RA. Patient reports no difficulties with breakfast meal earlier this date. Pt seen with cup and straw sips of thin liquids: oral phase unremarkable, no overt s/sx aspiration observed. Pt seen with regular solids (graham crackers): he demonstrated a fast rate of intake, but mastication adequate and swallow initiation appeared to be timely, with no observable oral residue after the swallow. Patient observed to have a slightly increased work of breathing while consuming PO, he stating "hold on now, I need to catch my breath." When asked if he feels as though he has to "catch his breath" often while eating, patient said he wasn't sure. No immediate s/sx aspiration with graham crackers, but patient did have 1 instance of delayed coughing (>1 minute after the swallow). Unclear if related to PO intake or not.   Recommend continuing regular solids/ thin liquids, intermittent supervision to cue for strategies: slowing rate of intake , sitting upright for PO intake.  ST to follow briefly as per POC.   HPI HPI: Pt is a 78 y.o. male admitted 10/07/19 with weakness, facial droop, and dysarthria. PMH: ESRD, DM, HTN, HLD, neuropathy, PAD, 2nd hyperparathyroidism, ED, depression, gout. Head CT: atrophy; Chest x-ray: Small effusions with right basilar atelectasis; WBC elevated on 2/11; afebrile      SLP Plan  Continue with current plan of care       Recommendations  Diet recommendations: Regular;Thin liquid Liquids provided via: Cup;Straw Medication Administration: Whole meds with liquid Supervision: Intermittent supervision to cue for compensatory strategies Compensations: Slow  rate;Small sips/bites;Minimize environmental distractions Postural Changes and/or Swallow Maneuvers: Seated upright 90 degrees                Oral Care Recommendations: Oral care BID Follow up Recommendations: None SLP Visit Diagnosis: Dysphagia, unspecified (R13.10) Plan: Continue with current plan of care       GO                Alvin Rubano [place] 10/23/2019, 10:19 AM Marina Goodell, M.Ed., [place] [phone]: Acute Rehab office [phone] - pager

## 2019-10-23 NOTE — Progress Notes (Signed)
Troponin 59 , Dr Gardiner Rhyme notified of result.  No special order.

## 2019-10-24 LAB — BASIC METABOLIC PANEL
Anion gap: 13 (ref 5–15)
BUN: 32 mg/dL — ABNORMAL HIGH (ref 8–23)
CO2: 27 mmol/L (ref 22–32)
Calcium: 9.5 mg/dL (ref 8.9–10.3)
Chloride: 102 mmol/L (ref 98–111)
Creatinine, Ser: 6.82 mg/dL — ABNORMAL HIGH (ref 0.61–1.24)
GFR calc Af Amer: 8 mL/min — ABNORMAL LOW (ref >60)
GFR calc non Af Amer: 7 mL/min — ABNORMAL LOW (ref >60)
Glucose, Bld: 139 mg/dL — ABNORMAL HIGH (ref 70–99)
Potassium: 4.2 mmol/L (ref 3.5–5.1)
Sodium: 142 mmol/L (ref 135–145)

## 2019-10-24 LAB — CBC
HCT: 33 % — ABNORMAL LOW (ref 39.0–52.0)
Hemoglobin: 10.3 g/dL — ABNORMAL LOW (ref 13.0–17.0)
MCH: 32.5 pg (ref 26.0–34.0)
MCHC: 31.2 g/dL (ref 30.0–36.0)
MCV: 104.1 fL — ABNORMAL HIGH (ref 80.0–100.0)
Platelets: 336 10*3/uL (ref 150–400)
RBC: 3.17 MIL/uL — ABNORMAL LOW (ref 4.22–5.81)
RDW: 13.4 % (ref 11.5–15.5)
WBC: 12.5 10*3/uL — ABNORMAL HIGH (ref 4.0–10.5)
nRBC: 0 % (ref 0.0–0.2)

## 2019-10-24 LAB — GLUCOSE, CAPILLARY
Glucose-Capillary: 119 mg/dL — ABNORMAL HIGH (ref 70–99)
Glucose-Capillary: 131 mg/dL — ABNORMAL HIGH (ref 70–99)
Glucose-Capillary: 110 mg/dL — ABNORMAL HIGH (ref 70–99)

## 2019-10-24 MED ORDER — OLANZAPINE 5 MG PO TBDP: 5.0000 mg | ORAL_TABLET | Freq: Every day | ORAL | 0 refills | Status: DC | PRN

## 2019-10-24 MED ORDER — ACETAMINOPHEN 325 MG PO TABS
ORAL_TABLET | ORAL | Status: AC
  Administered 2019-10-24: 650 mg via ORAL
  Filled 2019-10-24: qty 2

## 2019-10-24 MED ORDER — CHLORHEXIDINE GLUCONATE CLOTH 2 % EX PADS
6.0000 | MEDICATED_PAD | Freq: Every day | CUTANEOUS | Status: DC
  Administered 2019-10-24: 6 via TOPICAL

## 2019-10-24 MED ORDER — CALCITRIOL 0.5 MCG PO CAPS
ORAL_CAPSULE | ORAL | Status: AC
  Administered 2019-10-24: 0.5 ug via ORAL
  Filled 2019-10-24: qty 1

## 2019-10-24 MED ORDER — AMOXICILLIN-POT CLAVULANATE 875-125 MG PO TABS: 1.0000 | ORAL_TABLET | Freq: Two times a day (BID) | ORAL | 0 refills | Status: AC

## 2019-10-24 NOTE — TOC Transition Note (Addendum)
Transition of Care [facility]) - CM/SW Discharge Note   Patient Details  Name: Frank [name] MRN: [id number] Date of Birth: [birth year]  Transition of Care [facility]) CM/SW Contact:  Alberteen Sam, LCSW Phone Number: 10/24/2019, 3:20 PM   Clinical Narrative:     [facility] Anticipated DC date: 10/24/2019 Family notified:[name] (daughter)  Transport [phone]  Per MD patient ready for DC to [facility] RN, patient, patient's family, and facility notified of DC. Discharge Summary sent to facility. RN given number for report   [place]. DC packet on chart. Ambulance transport requested for patient for 5:00 pm.  CSW signing off.  [place], [place]   Final next level of care: Skilled Nursing Facility Barriers to Discharge: No Barriers Identified   Patient Goals and CMS Choice   CMS Medicare.gov Compare Post Acute Care list provided to:: Patient Represenative (must comment)(daughter [name]) Choice offered to / list presented to : Adult Children  Discharge Placement PASRR number recieved: 10/14/19            Patient [facility]: [facility] Patient to be transferred to facility by: [place] Name of family member notified: [name] (Daughter) Patient and family notified of of transfer: 10/24/19  Discharge Plan and Services     Post Acute Care Choice: [place]                               Social Determinants of Health (SDOH) Interventions     Readmission Risk Interventions No flowsheet data found.

## 2019-10-24 NOTE — Progress Notes (Signed)
Subjective:  Didn't make dc to SNF yesterday, covid test was negative  Objective Vital signs in last 24 hours: Vitals:   10/23/19 2336 10/24/19 0443 10/24/19 0454 10/24/19 0739  BP: (!) 154/71 140/73  129/74  Pulse: 92 85  91  Resp: 17 17  19   Temp: 98.1 F (36.7 C) 97.8 F (36.6 C)  98.3 F (36.8 C)  TempSrc: Oral Oral  Oral  SpO2: 96% 95%  97%  Weight:   94.6 kg   Height:       Weight change: -0.6 kg  Physical Exam General:NAD, WDWN Heart:RRR no rub or mur  Lungs:CTAB, nml WOB Abdomen:soft, NTND Extremities:no LE edema Dialysis Access: LU AVG  Patent on HD HD   Dialysis:  TTS [facility]   4h  92kg   2/2.25 bath  LUA AVG  Hep 5000 Calcitriol0.81mcg PO qHD  Assessment/Plan: 1. 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 Per neuro.S/p ICA stent on 2/4, brilinta BID.Ok for heparin. Possible dc to SNF today.  2. ESRD-HD TTS.Had S/p temp cath 1/28 and AVG declot 1/29.  Using AVG successfully, temp cath dc'd.  HD today then possibly dc to SNF afterwards.   3. Hypotension/volume- BP's good. Wts here 3kg up, he may have gained body wt. Were unable to pull vol any lower here.  Midodrine ^'d here from 10mg  pre HD TTS to 10 mg tid.  4. Anemiaof CKD- Hgb 11.2.>10.7  No indication for ESA at this time. 5. Secondary Hyperparathyroidism -P 5.7  renvela 3 TID AC - continue VDRA.   Ca corec mild ^ monitor    6. Nutrition- Renal diet w/fluid /mutivit - add nepro  7. DM- per primary 8. Hypothyroidism -on levothyroxine 9. Dysphagia - on regular renal diet 10. UTI/urethritis: cipro 11. Urinary retention: hx BPH, flomax held when in ICU w/ low BP's. Flomax resumed. Needed foley for retention. Will need follow up with urology and voiding trial after dc 12. Dispo: per primary  Kelly Splinter, MD 10/24/2019, 10:11 AM      Labs: Basic  Metabolic Panel: Recent Labs  Lab 10/17/19 1122 10/18/19 0446 10/22/19 0228 10/23/19 1016 10/24/19 0758  NA 138   < > 139 139 142  K 3.9   < > 3.9 4.2 4.2  CL 97*   < > 99 101 102  CO2 21*   < > 24 28 27   GLUCOSE 85   < > 131* 153* 139*  BUN 33*   < > 34* 21 32*  CREATININE 6.11*   < > 6.77* 5.45* 6.82*  CALCIUM 8.8*   < > 9.2 9.2 9.5  PHOS 5.7*  --   --   --   --    < > = values in this interval not displayed.   Liver Function Tests: Recent Labs  Lab 10/17/19 1122  ALBUMIN 3.0*   No results for input(s): LIPASE, AMYLASE in the last 168 hours. Recent Labs  Lab 10/18/19 1908  AMMONIA 24   CBC: Recent Labs  Lab 10/17/19 1122 10/18/19 0446 10/20/19 0557 10/20/19 0557 10/21/19 0255 10/21/19 0255 10/22/19 0228 10/22/19 0228 10/22/19 1832 10/23/19 0213 10/24/19 0758  WBC 8.9   < > 12.0*   < > 14.7*   < > 11.9*  --   --  10.6* 12.5*  NEUTROABS 7.2  --   --   --   --   --   --   --   --   --   --  HGB 10.9*   < > 10.5*   < > 10.8*   < > 10.7*   < > 11.0* 10.0* 10.3*  HCT 33.6*   < > 32.5*   < > 34.0*   < > 34.0*   < > 34.2* 32.1* 33.0*  MCV 99.7   < > 100.0  --  101.5*  --  100.9*  --   --  104.2* 104.1*  PLT 259   < > 290   < > 330   < > 310  --   --  331 336   < > = values in this interval not displayed.   Cardiac Enzymes: No results for input(s): CKTOTAL, CKMB, CKMBINDEX, TROPONINI in the last 168 hours. CBG: Recent Labs  Lab 10/23/19 0615 10/23/19 1253 10/23/19 1539 10/23/19 2142 10/24/19 0628  GLUCAP 161* 153* 121* 156* 119*    Studies/Results: No results found. Medications: . sodium chloride Stopped (10/22/19 1606)  . cefTRIAXone (ROCEPHIN)  IV 1 g (10/23/19 1325)   . aspirin EC  81 mg Oral Daily  . atorvastatin  20 mg Oral q1800  . calcitRIOL  0.5 mcg Oral Q T,Th,Sa-HD  . Chlorhexidine Gluconate Cloth  6 each Topical Q0600  . clotrimazole   Topical BID  . feeding supplement (NEPRO CARB STEADY)  237 mL Oral TID WC  . insulin aspart  0-15  Units Subcutaneous TID WC  . insulin aspart  0-5 Units Subcutaneous QHS  . levothyroxine  175 mcg Oral QAC breakfast  . lidocaine  1 application Urethral Once  . midodrine  10 mg Oral TID WC  . multivitamin  1 tablet Oral QHS  . pantoprazole (PROTONIX) IV  40 mg Intravenous Q12H  . polyethylene glycol  17 g Oral Daily  . QUEtiapine  12.5 mg Oral QHS  . sevelamer carbonate  2,400 mg Oral TID WC  . sodium chloride flush  10-40 mL Intracatheter Q12H  . tamsulosin  0.4 mg Oral Daily  . ticagrelor  90 mg Oral BID

## 2019-10-24 NOTE — Progress Notes (Signed)
1500Spoke to pt daughter again and notified her pt had about 50 more min on Dialysis and that plans for SNF are still a GO today.  Pt continues to be in dialysis

## 2019-10-24 NOTE — Progress Notes (Signed)
Tele called to report PVCs and bigeminy. Provider notified.

## 2019-10-24 NOTE — Progress Notes (Signed)
Per Dialysis Pt will be going to dialysis after lunch  They had a full schedule when the request for him was put in.  Pt continue to ask and his daughter called as well. Will give her a call back to notify her and will call her when pt is discharged for transfer to SNF

## 2019-10-24 NOTE — Progress Notes (Signed)
Progress Note   Subjective   Doing well today, the patient denies CP or SOB.  No new concerns  Inpatient Medications    Scheduled Meds: . aspirin EC  81 mg Oral Daily  . atorvastatin  20 mg Oral q1800  . calcitRIOL  0.5 mcg Oral Q T,Th,Sa-HD  . Chlorhexidine Gluconate Cloth  6 each Topical Q0600  . clotrimazole   Topical BID  . feeding supplement (NEPRO CARB STEADY)  237 mL Oral TID WC  . insulin aspart  0-15 Units Subcutaneous TID WC  . insulin aspart  0-5 Units Subcutaneous QHS  . levothyroxine  175 mcg Oral QAC breakfast  . lidocaine  1 application Urethral Once  . midodrine  10 mg Oral TID WC  . multivitamin  1 tablet Oral QHS  . pantoprazole (PROTONIX) IV  40 mg Intravenous Q12H  . polyethylene glycol  17 g Oral Daily  . QUEtiapine  12.5 mg Oral QHS  . sevelamer carbonate  2,400 mg Oral TID WC  . sodium chloride flush  10-40 mL Intracatheter Q12H  . tamsulosin  0.4 mg Oral Daily  . ticagrelor  90 mg Oral BID   Continuous Infusions: . sodium chloride Stopped (10/22/19 1606)  . cefTRIAXone (ROCEPHIN)  IV 1 g (10/23/19 1325)   PRN Meds: acetaminophen **OR** acetaminophen (TYLENOL) oral liquid 160 mg/5 mL **OR** acetaminophen, bisacodyl, ketoconazole, OLANZapine zydis, sodium chloride flush   Vital Signs    Vitals:   10/23/19 2336 10/24/19 0443 10/24/19 0454 10/24/19 0739  BP: (!) 154/71 140/73  129/74  Pulse: 92 85  91  Resp: 17 17  19   Temp: 98.1 F (36.7 C) 97.8 F (36.6 C)  98.3 F (36.8 C)  TempSrc: Oral Oral  Oral  SpO2: 96% 95%  97%  Weight:   94.6 kg   Height:        Intake/Output Summary (Last 24 hours) at 10/24/2019 1110 Last data filed at 10/24/2019 0500 Gross per 24 hour  Intake 340 ml  Output 1100 ml  Net -760 ml   Filed Weights   10/22/19 0120 10/23/19 0150 10/24/19 0454  Weight: 95.5 kg 95.2 kg 94.6 kg    Telemetry    Sinus with mobitz I second degree AV block,  PACs,  Occasionally non conducted PACs,  No mobitz II or CHB seen,   No prolonged pauses - Personally Reviewed  Physical Exam   GEN- The patient is chronically ill appearing, alert and oriented x 3 today.   Head- normocephalic, atraumatic Eyes-  Sclera clear, conjunctiva pink Ears- hearing intact Oropharynx- clear Neck- supple, Lungs-   normal work of breathing Heart- Regular rate and rhythm  GI- soft  MS- diffuse atrophy Psych- euthymic mood, full affect   Labs    Chemistry Recent Labs  Lab 10/17/19 1122 10/18/19 0446 10/22/19 0228 10/23/19 1016 10/24/19 0758  NA 138   < > 139 139 142  K 3.9   < > 3.9 4.2 4.2  CL 97*   < > 99 101 102  CO2 21*   < > 24 28 27   GLUCOSE 85   < > 131* 153* 139*  BUN 33*   < > 34* 21 32*  CREATININE 6.11*   < > 6.77* 5.45* 6.82*  CALCIUM 8.8*   < > 9.2 9.2 9.5  ALBUMIN 3.0*  --   --   --   --   GFRNONAA 8*   < > 7* 9* 7*  GFRAA 9*   < >  8* 11* 8*  ANIONGAP 20*   < > 16* 10 13   < > = values in this interval not displayed.     Hematology Recent Labs  Lab 10/22/19 0228 10/22/19 0228 10/22/19 1832 10/23/19 0213 10/24/19 0758  WBC 11.9*  --   --  10.6* 12.5*  RBC 3.37*  --   --  3.08* 3.17*  HGB 10.7*   < > 11.0* 10.0* 10.3*  HCT 34.0*   < > 34.2* 32.1* 33.0*  MCV 100.9*  --   --  104.2* 104.1*  MCH 31.8  --   --  32.5 32.5  MCHC 31.5  --   --  31.2 31.2  RDW 13.3  --   --  13.2 13.4  PLT 310  --   --  331 336   < > = values in this interval not displayed.    Assessment & Plan    1.  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  He has scheduled follow-up with EP.  I would not advise any additional monitoring for this patient.  Cardiology team to see as needed while here. Please call with questions.   Thompson Grayer MD, [facility] 10/24/2019 11:10 AM

## 2019-10-24 NOTE — Progress Notes (Signed)
Tele called to report 3 beats of un-sustained Vtach and bigeminy. Pt assessed and asymptomatic. Provider notified.

## 2019-10-24 NOTE — Progress Notes (Signed)
1605Pt returned from Dialysis.  Pt alert and oriented x3.  Pt daughter at bedside.  Est 1 Liter removed Will administer Rocephin IVPB before discharge to SNF

## 2019-10-24 NOTE — Progress Notes (Signed)
Per Dialysis they will send transport for him now

## 2019-10-24 NOTE — Discharge Summary (Addendum)
Physician Discharge Summary  Frank [name] TML:[id number] DOB: 1941/03/19 DOA: 10/07/2019  PCP: McLean-Scocuzza, Nino Glow, MD  Admit date: 10/07/2019 Discharge date: 10/24/2019  Admitted From: Home  Disposition:  SNF  Recommendations for Outpatient Follow-up:  1. Follow up with PCP in 1-2 weeks 2. Please obtain BMP/CBC in one week 3. Follow up with Cardiology for further evaluation of A-V block  4. Follow up with Dr Estanislado Pandy in 4 weeks post stent.  5. Monitor hb and Potasium level.  6. Needs to follow up with urology for voiding trial.    Discharge Condition: Stable.  CODE STATUS: Full code Diet recommendation: Renal Diet.   Brief/Interim Summary: 79 year old with past medical history significant for ESRD on hemodialysis, diabetes, hypertension, hyperlipidemia, hypothyroidism, PAD, gout, depression who presented to the ED on 1/27 with complaining of weakness, facial droop, slurred speech.  He was hypotensive and there was concern for CVA.  Neurology was consulted.  Patient was admitted to ICU for IV pressors.  Stroke work-up revealed multiple scattered WM infarcts right more than left in watershed territories in the setting of hypoperfusion and right ICA high-grade stenosis.  Underwent cerebral arteriogram with right ICA revascularization/stent.  Currently plan is to continue dual antiplatelet therapy while awaiting a skilled nursing facility bed.  Hospital course complicated by delirium, dyspnea and telemetry abnormality prompting cardiology evaluation.  1-Multiple scattered WM infarcts right more than left in watershed territories -In the setting of hypoperfusion and right ICA high-grade stenosis. Status post bilateral angiogram followed by a stent assisted angioplasty of right ICA proximal with distal protection on 10/15/2019 -Echocardiogram ejection fraction 60 to 65%.  No source of embolus. -LDL: Hemoglobin A1c 6.7 -Continue with aspirin and Brilinta -Need to follow-up with neurology  in 4 weeks. -Zyprexa/Seroquel as needed for metabolic encephalopathy delirium. Will provide Zyprexa PRN.   2-ESRD on hemodialysis TTS, clotted AV fistula: Successful declot of left AV fistula on 10/09/2019 -Temporary hemodialysis catheter placement on 10/08/2019. HD catheter was removed.   3-Chronic Hypotension;  -Initially low dose neo-synephrine drip in ICU.  -Was on midodrine TID. Then change to BID.- change back to TID by Nephrology.  -BP stable on addiction of Flomax.   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  Cardiology was consulted.  Per cardiology patient has episode of Wenkebach , no bradycardia or higher AV bloc, no need for PPM. Follow up with EP out patient.  -EKG today with bradycardia and wenckebach. Replete k. Dr Gardiner Rhyme reviewed EKG and looks similar than before.  -Called by nurse; patient Hr rate drops to 30--40 at times, and AV block. Mg replaced.  Cardiology consulted again. No indication for Pacemaker.  -Bigeminy. Electrolytes normal. Discussed with Dr Gardiner Rhyme, patient is stable for discharge./  -cardiology will arrange follow up with EP.  -Appreciate Dr Rayann Heman evaluation and follow up.   Dyspnea: Chest x-ray on 12/6 showed mild bibasilar subsegmental atelectasis.  Incentive spirometry order Improved..  Treating for PNA, day 3 IV antibiotics. discharge on Augmentin for 5 more days.  Report  improvement of dyspnea.   Insomnia delirium: Discontinue Ambien.  Continue with Seroquel.  CT head unremarkable on 12/7.  Hypothyroidism: Continue with Synthroid  Urinary retention: Patient has a history of BPH.  Per notes Flomax was discontinued in ICU due to concern with hypotension.  He failed voiding trial. Discussed with Dr. Melvia Heaps now the blood pressure  are improved, Flomax was started.    Finasteride stopped.  Needs follow up with urology and voiding trial.   Anemia of chronic kidney disease: B12:1437. Hb remain stable.   Obesity: BMI 32.  Monitor  PNA; Leukocytosis: WBC fluctuates 14---12--10---12.  Patient report mild cough.  Chest x-ray effusion and mild right-sided atelectasis. Continue with incentive spirometry. Treated with ceftriaxone for 3 days IV. Plan to discharge on Augmentin for 5 more days.  Can received antibiotics after HD> on HD days.  UA negative Remain afebrile, denies worsening cough, Oxygen sat 96 % RA,.   Hypokalemia: Replaced.  Constipation; On  miralax. Received Dulcolax suppository Had BM.  DM; check CBG four times a day before meals and bed time  Monitor CBG, he has not required significant amount of insulin.   Smear blood stool, occult blood positive;  Episode happen after suppository. No further bleeding.  Hold heparin sub q for DVT prophylaxis.  Hb has remain stable at 10.  Discharge on PPI.  Needs colonoscopy out patient when he recovers.   ST changes; in one Lead V2; Patient asymptomatic. EKG ordered.  Discussed with cardiology, mild increase  ST V 2. Discussed with cardiology not significant elevation of troponin.      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 ([facility])   Atrioventricular block, Mobitz type 1, Wenckebach    Discharge Instructions  Discharge Instructions    Diet - low sodium heart healthy   Complete by: As directed                          Allergies as of 10/24/2019      Reactions   Ace Inhibitors Other (See Comments)   Hyperkalemia on ACE INHIBITORS Cr>1.9   Angiotensin Receptor Blockers Other (See Comments)   Hyperkalemia Elevates Cr > 1.9    Ivp Dye [iodinated Diagnostic Agents] Other (See Comments)   Cannot have due to renal failure   Adhesive [tape] Other (See Comments)   UNDESCRIBED REACTION Can tolerate ONLY Coban wrap or mild paper tape!!      Medication List    STOP taking these medications   FLUAD IM   gabapentin 300 MG capsule Commonly known as: NEURONTIN   ondansetron 4 MG tablet Commonly known as: ZOFRAN   PRESCRIPTION MEDICATION   sevelamer carbonate 2.4 g Pack Commonly known as: RENVELA Replaced by: sevelamer carbonate 800 MG tablet     TAKE these medications   allopurinol 100 MG tablet Commonly known as: ZYLOPRIM Take 1 tablet (100 mg total) by mouth daily.   amoxicillin-clavulanate 875-125 MG tablet Commonly known as: Augmentin Take 1 tablet by mouth 2 (two) times daily for 5 days.   aspirin EC 81 MG tablet Take 81 mg daily by mouth.   atorvastatin 20 MG tablet Commonly known as: LIPITOR Take 20 mg by mouth daily at 6 PM.   BALMEX EX Apply 1 application topically daily as needed (to irritated areas of skin).   calcitRIOL 0.5 MCG capsule Commonly known as: ROCALTROL Take 1 capsule (0.5 mcg total) by mouth Every Tuesday,Thursday,and Saturday with dialysis. What changed:   when to take this  additional instructions   clotrimazole 1 % cream Commonly known as: LOTRIMIN  Apply 1 application topically daily as needed (For yeast, groin rash).   docusate sodium 100 MG capsule Commonly known as: COLACE Take 200 mg by mouth daily as needed.   feeding supplement (NEPRO CARB STEADY) Liqd Take 237 mLs by mouth 3 (three) times daily with meals.   ketoconazole 2 % shampoo Commonly known as: Nizoral Apply 1 application topically 2 (two) times a week. What changed:   when to take this  reasons to take this   levothyroxine 175 MCG tablet Commonly known as: SYNTHROID Take 1 tablet (175 mcg total) by mouth daily before breakfast. What changed:   medication strength  how much to  take   lidocaine-prilocaine cream Commonly known as: EMLA Apply 1 application topically See admin instructions. Apply to access site 1 to 2 hours before dialysis. Cover with occlusive dressing.   midodrine 10 MG tablet Commonly known as: PROAMATINE Take 1 tablet (10 mg total) by mouth 3 (three) times daily with meals. What changed:   when to take this  additional instructions   multivitamin Tabs tablet Take 1 tablet daily by mouth.   OLANZapine zydis 5 MG disintegrating tablet Commonly known as: ZYPREXA Take 1 tablet (5 mg total) by mouth daily as needed (delirium).   pantoprazole 40 MG tablet Commonly known as: Protonix Take 1 tablet (40 mg total) by mouth daily.   polyethylene glycol 17 g packet Commonly known as: MIRALAX / GLYCOLAX Take 17 g by mouth daily.   QUEtiapine 25 MG tablet Commonly known as: SEROQUEL Take 0.5 tablets (12.5 mg total) by mouth at bedtime.   sevelamer carbonate 800 MG tablet Commonly known as: RENVELA Take 3 tablets (2,400 mg total) by mouth 3 (three) times daily with meals. Replaces: sevelamer carbonate 2.4 g Pack   tamsulosin 0.4 MG Caps capsule Commonly known as: FLOMAX Take 1 capsule (0.4 mg total) by mouth daily.   ticagrelor 90 MG Tabs tablet Commonly known as: BRILINTA Take 1 tablet (90 mg total) by mouth 2 (two) times daily.       Contact information for follow-up providers    Luanne Bras, MD Follow up in 4 week(s).   Specialties: Interventional Radiology, Radiology Why: Please follow-up withj Dr. Estanislado Pandy in clinic 4 weeks after discharge. Our office will call you to set up this appointment. Contact information: [address] [place] [address] [phone]            Contact information for after-discharge care    Destination    [facility] .   Service: Skilled Nursing Contact information: [address] [place] [phone]                  Allergies  Allergen Reactions  . Ace Inhibitors Other (See Comments)    Hyperkalemia on ACE INHIBITORS Cr>1.9  . Angiotensin Receptor Blockers Other (See Comments)    Hyperkalemia Elevates Cr > 1.9   . Ivp Dye [Iodinated Diagnostic Agents] Other (See Comments)    Cannot have due to renal failure  . Adhesive [Tape] Other (See Comments)    UNDESCRIBED REACTION Can tolerate ONLY Coban wrap or mild paper tape!!    Consultations:  Cardiology  Neurology  IR   Procedures/Studies: CT ANGIO HEAD W OR WO CONTRAST  Result Date: 10/07/2019 CLINICAL DATA:  Code stroke, follow-up EXAM: CT ANGIOGRAPHY HEAD AND NECK CT PERFUSION BRAIN TECHNIQUE: Multidetector CT imaging of the head and neck was performed using the standard protocol during bolus administration of  intravenous contrast. Multiplanar CT image reconstructions and MIPs were obtained to evaluate the vascular anatomy. Carotid stenosis measurements (when applicable) are obtained utilizing NASCET criteria, using the distal internal carotid diameter as the denominator. Multiphase CT imaging of the brain was performed following IV bolus contrast injection. Subsequent parametric perfusion maps were calculated using RAPID software. CONTRAST:  161m OMNIPAQUE IOHEXOL 350 MG/ML SOLN COMPARISON:  None. FINDINGS: CTA NECK FINDINGS Aortic arch: Great vessel origins are patent. There is right origin of the left vertebral artery from the arch, with calcified plaque noted at the origin. Right carotid system: Patent. There is calcified and noncalcified plaque along the proximal ICA causing 75% stenosis. Left carotid system: Patent. There is calcified plaque at the ICA origin causing minimal stenosis. Vertebral arteries: Patent. Calcified plaque at both vertebral artery origins without evidence of flow limitation. Right vertebral artery is dominant. Skeleton: Degenerative changes. Other neck: No mass or adenopathy. Upper chest: No apical lung mass. Review of  the MIP images confirms the above findings CTA HEAD FINDINGS Anterior circulation: Intracranial internal carotid arteries are patent with calcified plaque causing mild stenosis. Anterior and middle cerebral arteries are patent. Posterior circulation: Intracranial vertebral arteries are patent with calcified plaque causing moderate stenosis on the left. Basilar artery is patent. Posterior cerebral arteries are patent with bilateral posterior communicating arteries present. Venous sinuses: As permitted by contrast timing, patent. Review of the MIP images confirms the above findings CT Brain Perfusion Findings: CBF (<30%) Volume: 06mPerfusion (Tmax>6.0s) volume: 55m6mismatch Volume: 55mL65mfarction Location: None IMPRESSION: No large vessel occlusion. No evidence of core infarction or territory at risk by perfusion imaging. Plaque at the proximal right ICA causes 75% stenosis. There is minimal stenosis of the left ICA origin. Electronically Signed   By: PranMacy Mis.   On: 10/07/2019 09:41   DG Chest 2 View  Result Date: 10/21/2019 CLINICAL DATA:  History of recent stroke with elevated white blood cell count, possible infiltrate EXAM: CHEST - 2 VIEW COMPARISON:  10/17/2019 FINDINGS: Cardiac shadow is enlarged but stable. Dialysis catheter is been removed in the interval. Right basilar atelectasis is seen stable from the previous exam. Interval clearing in the left base is seen. Small effusions are noted posteriorly. IMPRESSION: Small effusions with right basilar atelectasis. Electronically Signed   By: MarkInez Catalina.   On: 10/21/2019 12:34   CT HEAD WO CONTRAST  Result Date: 10/18/2019 CLINICAL DATA:  Encephalopathy. Stent assisted angioplasty right ICA. EXAM: CT HEAD WITHOUT CONTRAST TECHNIQUE: Contiguous axial images were obtained from the base of the skull through the vertex without intravenous contrast. COMPARISON:  CT studies 10/07/2019.  MRI 10/07/2019. FINDINGS: Brain: No acute finding by CT.  Low-density in the cerebral hemispheric white matter could be consistent with chronic small vessel disease, but is known based on the previous MRI to relate at least in part to more recent deep white matter infarctions. No new abnormality. No hemorrhage mass-effect, hydrocephalus or extra-axial collection. Vascular: There is atherosclerotic calcification of the major vessels at the base of the brain. Skull: Negative Sinuses/Orbits: Clear/normal Other: None IMPRESSION: No acute finding by CT. Low-density in the cerebral hemispheric white matter appears similar to the previous CT studies and represents combination of chronic small vessel disease and more recent deep white matter infarctions. No sign of hemorrhage or mass effect. Electronically Signed   By: MarkNelson Chimes.   On: 10/18/2019 21:51   CT ANGIO NECK W OR WO CONTRAST  Result Date: 10/07/2019 CLINICAL  DATA:  Code stroke, follow-up EXAM: CT ANGIOGRAPHY HEAD AND NECK CT PERFUSION BRAIN TECHNIQUE: Multidetector CT imaging of the head and neck was performed using the standard protocol during bolus administration of intravenous contrast. Multiplanar CT image reconstructions and MIPs were obtained to evaluate the vascular anatomy. Carotid stenosis measurements (when applicable) are obtained utilizing NASCET criteria, using the distal internal carotid diameter as the denominator. Multiphase CT imaging of the brain was performed following IV bolus contrast injection. Subsequent parametric perfusion maps were calculated using RAPID software. CONTRAST:  162m OMNIPAQUE IOHEXOL 350 MG/ML SOLN COMPARISON:  None. FINDINGS: CTA NECK FINDINGS Aortic arch: Great vessel origins are patent. There is right origin of the left vertebral artery from the arch, with calcified plaque noted at the origin. Right carotid system: Patent. There is calcified and noncalcified plaque along the proximal ICA causing 75% stenosis. Left carotid system: Patent. There is calcified plaque at  the ICA origin causing minimal stenosis. Vertebral arteries: Patent. Calcified plaque at both vertebral artery origins without evidence of flow limitation. Right vertebral artery is dominant. Skeleton: Degenerative changes. Other neck: No mass or adenopathy. Upper chest: No apical lung mass. Review of the MIP images confirms the above findings CTA HEAD FINDINGS Anterior circulation: Intracranial internal carotid arteries are patent with calcified plaque causing mild stenosis. Anterior and middle cerebral arteries are patent. Posterior circulation: Intracranial vertebral arteries are patent with calcified plaque causing moderate stenosis on the left. Basilar artery is patent. Posterior cerebral arteries are patent with bilateral posterior communicating arteries present. Venous sinuses: As permitted by contrast timing, patent. Review of the MIP images confirms the above findings CT Brain Perfusion Findings: CBF (<30%) Volume: 067mPerfusion (Tmax>6.0s) volume: 4m96mismatch Volume: 4mL106mfarction Location: None IMPRESSION: No large vessel occlusion. No evidence of core infarction or territory at risk by perfusion imaging. Plaque at the proximal right ICA causes 75% stenosis. There is minimal stenosis of the left ICA origin. Electronically Signed   By: PranMacy Mis.   On: 10/07/2019 09:41   MR BRAIN WO CONTRAST  Result Date: 10/07/2019 CLINICAL DATA:  Initial evaluation for acute weakness, facial droop, slurred speech. Concern for stroke. EXAM: MRI HEAD WITHOUT CONTRAST TECHNIQUE: Multiplanar, multiecho pulse sequences of the brain and surrounding structures were obtained without intravenous contrast. COMPARISON:  Prior CTs from earlier the same day. FINDINGS: Brain: Moderately advanced age-related cerebral atrophy. Patchy T2/FLAIR hyperintensity within the periventricular white matter most consistent with chronic small vessel ischemic disease, mild in nature. Multifocal areas of restricted diffusion seen  involving the periventricular/periatrial white matter of both cerebral hemispheres, consistent with acute ischemic small vessel type infarcts. Overall, infarct burden slightly worse on the right as compared to the left. For reference purposes, largest area of ischemia measures approximately 12 mm at the posterior right frontal periventricular white matter. No associated hemorrhage or mass effect. No acute cortically based infarct. Gray-white matter differentiation otherwise maintained. No areas of remote cortical infarction. No acute intracranial hemorrhage. Single punctate chronic microhemorrhage noted at the right external capsule, likely related to small vessel disease. No other evidence for chronic intracranial hemorrhage. No mass lesion, midline shift or mass effect. No hydrocephalus. No extra-axial fluid collection. Incidental note made of a partially empty sella. Midline structures intact. Vascular: Major intracranial vascular flow voids are maintained. Skull and upper cervical spine: Craniocervical junction within normal limits. Bone marrow signal intensity normal. No scalp soft tissue abnormality. Sinuses/Orbits: Patient status post bilateral ocular lens replacement. Mild scattered mucosal thickening noted within the  ethmoidal air cells. Paranasal sinuses are otherwise clear. No mastoid effusion. Inner ear structures grossly normal. Other: None. IMPRESSION: 1. 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 No associated hemorrhage or mass effect. 2. Underlying moderately advanced age-related cerebral atrophy with mild chronic small vessel ischemic disease. Electronically Signed   By: Jeannine Boga M.D.   On: 10/07/2019 20:34   IR Fluoro Guide CV Line Right  Result Date: 10/08/2019 INDICATION: 79 year old with end-stage renal disease. Patient has a clotted left arm graft and will need  dialysis prior to the declot procedure. EXAM: FLUOROSCOPIC AND ULTRASOUND GUIDED PLACEMENT OF A NON-TUNNELED DIALYSIS CATHETER Physician: Stephan Minister. Henn, MD MEDICATIONS: None ANESTHESIA/SEDATION: None FLUOROSCOPY TIME:  Fluoroscopy Time: 12 seconds, 2 mGy COMPLICATIONS: None immediate. PROCEDURE: The procedure was explained to the patient. The risks and benefits of the procedure were discussed and the patient's questions were addressed. Informed consent was obtained from the patient. The patient was placed supine on the interventional table. Ultrasound confirmed a patent right internal jugular vein. Ultrasound images were obtained for documentation. The right neck was prepped and draped in a sterile fashion. The right neck was anesthetized with 1% lidocaine. Maximal barrier sterile technique was utilized including caps, mask, sterile gowns, sterile gloves, sterile drape, hand hygiene and skin antiseptic. A small incision was made with #11 blade scalpel. A 21 gauge needle directed into the right internal jugular vein with ultrasound guidance. A micropuncture dilator set was placed. A 16 cm Mahurkar catheter was selected. The catheter was advanced over a wire and positioned at the superior cavoatrial junction. Fluoroscopic images were obtained for documentation. Both dialysis lumens were found to aspirate and flush well. The proper amount of heparin was flushed in both lumens. The central venous lumen was flushed with normal saline. Catheter was sutured to skin. FINDINGS: Catheter tip at the superior cavoatrial junction. IMPRESSION: Successful placement of a right jugular non-tunneled dialysis catheter using ultrasound and fluoroscopic guidance. Electronically Signed   By: Markus Daft M.D.   On: 10/08/2019 17:18   IR INTRAVSC STENT CERV CAROTID W/EMB-PROT MOD SED  Result Date: 10/19/2019 CLINICAL DATA:  History of left sided weakness involving the arm and leg secondary to right cerebral hemispheric subcortical  ischemic changes with discovery of a high-grade 75% stenosis of the right internal carotid artery proximally. EXAM: INTRAVSC STENT CERV CAROTID W/ EMB-PROT COMPARISON:  CT angiogram of the head and neck of October 07, 2019. MEDICATIONS: Heparin 4,000 units IV; Ancef 2 g IV antibiotic was administered within 1 hour of the procedure. ANESTHESIA/SEDATION: General anesthesia as patient was unable to lay still with sedation. CONTRAST:  Isovue 300 approximately 85 mL. FLUOROSCOPY TIME:  Fluoroscopy Time: 28 minutes 0 seconds (1948 mGy). COMPLICATIONS: None immediate. TECHNIQUE: Informed written consent was obtained from the patient after a thorough discussion of the procedural risks, benefits and alternatives. All questions were addressed. Maximal Sterile Barrier Technique was utilized including caps, mask, sterile gowns, sterile gloves, sterile drape, hand hygiene and skin antiseptic. A timeout was performed prior to the initiation of the procedure. The right groin was prepped and draped in the usual sterile fashion. Thereafter using modified Seldinger technique, transfemoral access into the right common femoral artery was obtained without difficulty. Over a 0.035 inch guidewire, a 5 French Pinnacle sheath was inserted. Through this, and also over 0.035 inch guidewire, a 5 [place] JB 1 catheter was advanced to the aortic arch region and selectively positioned  in the innominate artery, right common carotid artery, and left common carotid artery. FINDINGS: The innominate artery arteriogram demonstrates wide patency of the right subclavian artery and the right common carotid artery proximally. The origin of the right vertebral artery appears mildly narrowed. More distally, the vessel is seen to opacify to the cranial skull base to the level of the right posterior-inferior cerebellar artery. The right common carotid arteriogram demonstrates the right external carotid artery and its major branches to be widely patent. The  right internal carotid artery on the AP projection demonstrates approximately 75% stenosis secondary to a smooth segmental plaque. More distally, there is mild post stenotic dilatation. The distal right ICA petrous cavernous and supraclinoid segments are widely patent. There is mild narrowing of the proximal right middle cerebral M1 segment. The right MCA distally and the right anterior cerebral artery opacify into the capillary and venous. There is prompt transient opacification via the anterior communicating artery of the left anterior cerebral A2 segment and distally. The left common carotid arteriogram demonstrates the left external carotid artery and its major branches to be widely patent. The left internal carotid artery at the bulb inferiorly demonstrates a smooth shallow plaque without evidence of intraluminal filling defects or of aspirations. More distally, the left internal carotid artery opacifies to the cranial skull base. The petrous, cavernous and the supraclinoid segments are widely patent. The left posterior communicating artery demonstrates partial transverse filling of the left posterior cerebral artery distribution. The left middle and the left anterior cerebral artery opacify into the capillary and venous phases. 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 The guidewire was removed. Good aspiration was obtained from the hub of the balloon guide catheter. Gentle contrast injection demonstrated no evidence of spasms, dissections or of intraluminal filling defects distal to the tip of the balloon guide.  Measurements were performed of the right internal carotid artery in its normal diameter portion proximally and also distally and also of the right common carotid artery. A 4-7 mm NAV Emboshield device was then flushed with heparinized saline infusion to clear the filter and of the air, and also the of the delivery microcatheter. The filter was then captured into the delivery device in the usual manner. The combination of the delivery microcatheter, with the filter and the delivery 014 inch micro guidewire was then retrieved in a J configuration given to the micro guidewire. Thereafter, using biplane roadmap technique and constant fluoroscopic guidance in a coaxial manner and with constant heparinized saline infusion the filter delivery microcatheter with the micro guidewire were then advanced to the right internal carotid artery. Using a torque device, the micro guidewire was advanced without difficulty followed by the filter catheter delivery device to the distal end of the right internal carotid artery. The micro guidewire was positioned in the horizontal segment of the petrous portion. The filter device was then deployed in the usual manner, the filter delivery microcatheter was retrieved and removed with the filter kept in stable position with intermittent fluoroscopy. A control arteriogram performed through the 8 French balloon guide in the right common carotid artery demonstrated safe position of the tip of the micro guidewire, and  also the filter. No evidence of dissections or of intraluminal filling defects or vasospasm was seen. A 6 mm/8 mm x 40 mm Xact stent delivery system was then retrogradely prepped with heparinized saline infusion. Thereafter using the rapid exchange technique, the stent delivery apparatus was then advanced without difficulty over the micro guidewire. The proximal and the distal landing zones were then positioned adequate distant from the site of the severe stenosis. The stent was  then deployed in the usual manner without difficulty. The delivery apparatus was retrieved and removed. A control arteriogram performed through the 8 [place] FlowGate guide catheter in the right common carotid artery demonstrated now significantly improved caliber and flow through the stented segment. There continued to be a waist of this stent which necessitated the advancement of a 5 mm x 30 mm Viatrac 14 angioplasty balloon microcatheter which had been prepped with heparinized saline infusion. Again using the rapid exchange technique, the angioplasty balloon was advanced and positioned adequate distant from the site of the waist. A control inflation was then performed using a micro inflation syringe device via micro tubing. Balloon was maintained for approximately 10 seconds. Thereafter, the balloon was deflated and retrieved and removed. A control arteriogram performed through the [facility] guide catheter in the right common carotid artery demonstrates significant improved caliber and flow through the stented segment. There continued to be a small dog ear at the distal end of the stent. However, this remained stable throughout the procedure. Control arteriograms were then performed at 15 and 30 minutes post angioplasty which continued to demonstrate excellent flow through the stented segment, and also intracranially without evidence of a filling defects or of occlusions. In view of the patient's long segment stenosis, the patient was given a total of 10.5 mg of intra-arterial Integrilin to obviate any complication of platelets at this site within the stent. Also, the patient's ACT was maintained in the region of approximately 200 seconds throughout the procedure. The filter was then captured using the filter capture device again using the rapid exchange technique. Control arteriogram performed through the [facility] shuttle sheath in the right common carotid artery demonstrates significantly improved caliber and flow  through the stented segment of the right intracranial proximally without evidence of intra stent irregularities or filling defects. Placement of a second stent in the distal first stent was not undertaken in view of the increased risk of possible inducing platelet aggregation. The balloon guide was then retrieved and removed. The 8 French Pinnacle sheath was then removed with the successful application of an 8 French Angio-Seal closure device for hemostasis in the right groin. Distal pulses remained Dopplerable in the posterior tibial, and the dorsalis pedis arteries bilaterally unchanged. A flat panel CT of the brain demonstrated no evidence of intracranial hemorrhage or mass effect. The patient's general anesthesia was then reversed, and the patient was then extubated without difficulty. The patient was slow in awakening from the general anesthetic. Eventually, however, the patient was able to respond appropriately and demonstrated orientation to place and year. He denied any headaches or nausea or vomiting. He was able to move all his 4 extremities without difficulty. He was then transferred to the neuro PACU and then neuro ICU for overnight close neurologic observations, continue on the low-dose IV heparin, and also management of blood pressure. Overnight, the patient's neurological status remained stable. The following morning, the IV heparin was stopped and the patient was switched to Brilinta 90 mg b.i.d., with aspirin 85 mg a day. The patient was  advised to sit up and attempt eating his breakfast. The right groin appeared soft with distal pulses remaining Dopplerable unchanged bilaterally. Instructions were given to the patient to continue with his dual medications. A follow-up in the clinic in 4 weeks will be undertaken with a probable ultrasound of the neck to evaluate the carotid stent status approximately 3 months from the time of the treatment. The patient expressed understanding. The patient was deemed  to be stable medically to be transferred to the primary service. 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 PLAN: Follow-up in the clinic 4 weeks post discharge, with ultrasound of the neck in 3 months from the time of the treatment. Electronically Signed   By: Luanne Bras M.D.   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  EXAM: INTRAVSC STENT CERV CAROTID W/ EMB-PROT  COMPARISON:  CT angiogram of the head and neck of October 07, 2019.  MEDICATIONS: Heparin 4,000 units IV; Ancef 2 g IV antibiotic was administered within 1 hour of the procedure.  ANESTHESIA/SEDATION: General anesthesia as patient was unable to lay still with sedation.  CONTRAST:  Isovue 300 approximately 85 mL.  FLUOROSCOPY TIME:  Fluoroscopy Time: 28 minutes 0 seconds (1948 mGy).  COMPLICATIONS: None immediate.  TECHNIQUE: Informed written consent was obtained from the patient after a thorough discussion of the procedural risks, benefits and alternatives. All questions were addressed. Maximal Sterile Barrier Technique was utilized including caps, mask, sterile gowns, sterile gloves, sterile drape, hand hygiene and skin antiseptic. A timeout was performed prior to the initiation of the procedure.  The right groin was prepped and draped in the usual sterile fashion. Thereafter using modified Seldinger technique, transfemoral access into the right common femoral artery was obtained without difficulty. Over a 0.035 inch guidewire, a 5 French Pinnacle sheath was inserted. Through this, and also over 0.035 inch guidewire, a 5 [place] JB 1  catheter was advanced to the aortic arch region and selectively positioned in the innominate artery, right common carotid artery, and left common carotid artery.  FINDINGS: The innominate artery arteriogram demonstrates wide patency of the right subclavian artery and the right common carotid artery proximally. The origin of the right vertebral artery appears mildly narrowed. More distally, the vessel is seen to opacify to the cranial skull base to the level of the right posterior-inferior cerebellar artery.  The right common carotid arteriogram demonstrates the right external carotid artery and its major branches to be widely patent.  The right internal carotid artery on the AP projection demonstrates approximately 75% stenosis secondary to a smooth segmental plaque. More distally, there is mild post stenotic dilatation. The distal right ICA petrous cavernous and supraclinoid segments are widely patent.  There is mild narrowing of the proximal right middle cerebral M1 segment. The right MCA distally and the right anterior cerebral artery opacify into the capillary and venous.  There is prompt transient opacification via the anterior communicating artery of the left anterior cerebral A2 segment and distally.  The left common carotid arteriogram demonstrates the left external carotid artery and its major branches to be widely patent.  The left internal carotid artery at the bulb inferiorly demonstrates a smooth  shallow plaque without evidence of intraluminal filling defects or of aspirations. More distally, the left internal carotid artery opacifies to the cranial skull base. The petrous, cavernous and the supraclinoid segments are widely patent.  The left posterior communicating artery demonstrates partial transverse filling of the left posterior cerebral artery distribution.  The left middle and the left anterior cerebral artery opacify into the capillary and venous phases.  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  The guidewire was removed. Good aspiration was obtained from the hub of the balloon guide catheter. Gentle contrast injection demonstrated no evidence of spasms, dissections or of intraluminal filling defects distal to the tip of the balloon guide.  Measurements were performed of the right internal carotid artery in its normal diameter portion proximally and also distally and also of the right common carotid artery.  A 4-7 mm NAV Emboshield device was then flushed with heparinized saline infusion to clear the filter and of the air, and also the of the delivery microcatheter. The filter was then captured into the delivery device in the usual manner. The combination of the delivery microcatheter, with the filter and the delivery 014 inch micro guidewire was then retrieved in a J configuration given to the micro guidewire.  Thereafter, using biplane roadmap technique and constant fluoroscopic guidance in a coaxial manner and with constant heparinized saline infusion the filter delivery microcatheter with the micro guidewire were then advanced to the right internal carotid artery.  Using a torque device, the micro guidewire was advanced without difficulty followed by the filter catheter delivery device to the distal end of the right internal carotid artery. The micro guidewire was positioned in the horizontal segment of the petrous portion.  The filter device was then deployed in the usual manner, the filter delivery microcatheter was retrieved and removed with the filter kept in stable position with intermittent  fluoroscopy.  A control arteriogram performed through the 8 French balloon guide in the right common carotid artery demonstrated safe position of the tip of the micro guidewire, and also the filter. No evidence of dissections or of intraluminal filling defects or vasospasm was seen.  A 6 mm/8 mm x 40 mm Xact stent delivery system was then retrogradely prepped with heparinized saline infusion.  Thereafter using the rapid exchange technique, the stent delivery apparatus was then advanced without difficulty over the micro guidewire. The proximal and the distal landing zones were then positioned adequate distant from the site of the severe stenosis. The stent was then deployed in the usual manner without difficulty. The delivery apparatus was retrieved and removed. A control arteriogram performed through the 8 [place] FlowGate guide catheter in the right common carotid artery demonstrated now significantly improved caliber and flow through the stented segment. There continued to be a waist of this stent which necessitated the advancement of a 5 mm x 30 mm Viatrac 14 angioplasty balloon microcatheter which had been prepped with heparinized saline infusion. Again using the rapid exchange technique, the angioplasty balloon was advanced and positioned adequate distant from the site of the waist. A control inflation was then performed using a micro inflation syringe device via micro tubing.  Balloon was maintained for approximately 10 seconds. Thereafter, the balloon was deflated and retrieved and removed. A control arteriogram performed through the [facility] guide catheter in the right common carotid artery demonstrates significant improved caliber and flow through the stented segment.  There continued to be a small dog ear at the distal end of the stent. However, this remained stable throughout the procedure.  Control arteriograms were then performed at 15 and 30 minutes post angioplasty which continued to demonstrate  excellent flow through the stented segment, and also intracranially without evidence of a filling defects or of occlusions. In view of the patient's long segment stenosis, the patient was given a total of 10.5 mg of intra-arterial Integrilin to obviate any complication of platelets at this site within the stent.  Also, the patient's ACT was maintained in the region of approximately 200 seconds throughout the procedure.  The filter was then captured using the filter capture device again using the rapid exchange technique.  Control arteriogram performed through the [facility] shuttle sheath in the right common carotid artery demonstrates significantly improved caliber and flow through the stented segment of the right intracranial proximally without evidence of intra stent irregularities or filling defects.  Placement of a second stent in the distal first stent was not undertaken in view of the increased risk of possible inducing platelet aggregation.  The balloon guide was then retrieved and removed. The 8 French Pinnacle sheath was then removed with the successful application of an 8 French Angio-Seal closure device for hemostasis in the right groin. Distal pulses remained Dopplerable in the posterior tibial, and the dorsalis pedis arteries bilaterally unchanged.  A flat panel CT of the brain demonstrated no evidence of intracranial hemorrhage or mass effect. The  patient's general anesthesia was then reversed, and the patient was then extubated without difficulty. The patient was slow in awakening from the general anesthetic. Eventually, however, the patient was able to respond appropriately and demonstrated orientation to place and year.  He denied any headaches or nausea or vomiting.  He was able to move all his 4 extremities without difficulty. He was then transferred to the neuro PACU and then neuro ICU for overnight close neurologic observations, continue on the low-dose IV heparin, and also management of  blood pressure.  Overnight, the patient's neurological status remained stable. The following morning, the IV heparin was stopped and the patient was switched to Brilinta 90 mg b.i.d., with aspirin 85 mg a day. The patient was advised to sit up and attempt eating his breakfast.  The right groin appeared soft with distal pulses remaining Dopplerable unchanged bilaterally.  Instructions were given to the patient to continue with his dual medications. A follow-up in the clinic in 4 weeks will be undertaken with a probable ultrasound of the neck to evaluate the carotid stent status approximately 3 months from the time of the treatment.  The patient expressed understanding. The patient was deemed to be stable medically to be transferred to the primary service.  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  PLAN: Follow-up in the clinic 4 weeks post discharge, with ultrasound of the neck in 3 months from the time of the treatment.   Electronically Signed   By: Luanne Bras M.D.   On: 10/16/2019 09:31   IR US Guide Vasc Access Left  Result Date: 10/09/2019 INDICATION: Clotted graft. History of end-stage renal disease currently receiving dialysis via  left upper extremity dialysis graft created approximately 6 months ago. Patient report, patient underwent declot procedure approximately 3 months ago. Patient underwent placement of a temporary right jugular approach dialysis catheter yesterday, 10/08/2019 for urgent dialysis and returns today for attempted image guided left upper extremity dialysis graft thrombolysis. EXAM: 1. FISTULALYSIS 2. ANGIOPLASTY OF VENOUS LIMB AND VENOUS ANASTOMOSIS 3. ULTRASOUND GUIDANCE FOR VENOUS ACCESS COMPARISON:  Image guided right non tunneled dialysis catheter placement-10/08/2019. MEDICATIONS: Heparin 4000 units IV; TPA 4 mg  into graft. CONTRAST:  35 cc Omnipaque 3 ANESTHESIA/SEDATION: Moderate (conscious) sedation was employed during this procedure. A total of Versed 2 mg and Fentanyl 100 mcg was administered intravenously. Moderate Sedation Time: 40 minutes. The patient's level of consciousness and vital signs were monitored continuously by radiology nursing throughout the procedure under my direct supervision. FLUOROSCOPY TIME:  4 minutes, 48 seconds (44 mGy) COMPLICATIONS: None immediate. TECHNIQUE: Informed written consent was obtained from the patient after a discussion of the risks, benefits and alternatives to treatment. Questions regarding the procedure were encouraged and answered. A timeout was performed prior to the initiation of the procedure. On physical examination, the existing left arm dialysis graft was negative for palpable pulse or thrill. The skin overlying the graft was prepped and draped in the usual sterile fashion, and a sterile drape was applied covering the operative field. Maximum barrier sterile technique with sterile gowns and gloves were used for the procedure. Under ultrasound guidance, the dialysis graft was accessed directed towards the venous anastomosis with a micropuncture kit after the overlying soft tissues were anesthetized with 1% lidocaine. An ultrasound image was saved for documentation purposes. The micropuncture sheath was exchange for a 7-French vascular sheath over a guidewire. Over a Benson wire, a Kumpe catheter was advanced centrally and a central venogram was performed. Pull back venogram was performed with the Kumpe catheter. Heparin was administered systemically and TPA was administered via the Kumpe catheter throughout near the entirety of the venous limb. The venous anastomosis and majority of the venous limb was angioplastied with a 6 mm x 4 cm Conquest balloon. An additional access was obtained directed towards the arterial anastomoses with a micropuncture kit after the overlying  soft tissues anesthetized with 1% lidocaine. This allowed for placement of a 6-French vascular sheath. The graft was thrombectomized with several rounds of push-pull mechanical thrombectomy with an occlusion balloon. Flow was restored to the graft as evidenced by restored pulsatility of the graft and brisk blood return from the side arm of the vascular sheath. Shuntograms were performed. Small area of adherent thrombus within the venous stick zone of the upper arm dialysis graft was angioplastied at multiple stations with a 7 mm x 4 cm Conquest balloon. Completion images were obtained. At this point, the procedure was terminated. All wires, catheters and sheaths were removed from the patient. Hemostasis was achieved at both access sites with deployment of a swizzle sutures which will be removed at the patient's next dialysis session. Dressings were placed. The patient tolerated the procedure without immediate postprocedural complication. FINDINGS: The existing left upper extremity AV graft is thrombosed. The venous anastomosis and near the entirety of the venous limb was angioplastied to 6 mm diameter. The graft was successfully thrombectomized using mechanical and pharmacologic means as above. Completion shuntogram demonstrates the venous limb and anastomosis are widely patent. The central venous system, arterial limb and arterial anastomosis are widely patent. IMPRESSION: 1. Technically successful left AV graft thrombolysis. 2. Successful angioplasty of the venous anastomosis and  majority of the venous limb to 6 mm diameter. Completion shuntogram demonstrates the venous limb and anastomosis are widely patent. 3. The arterial anastomosis and arterial limb are widely patent. 4. The central venous system is widely patent. ACCESS: This graft would be amenable to future percutaneous intervention as clinically indicated. Electronically Signed   By: Sandi Mariscal M.D.   On: 10/09/2019 17:32   IR US Guide Vasc Access  Right  Result Date: 10/08/2019 INDICATION: 79 year old with end-stage renal disease. Patient has a clotted left arm graft and will need dialysis prior to the declot procedure. EXAM: FLUOROSCOPIC AND ULTRASOUND GUIDED PLACEMENT OF A NON-TUNNELED DIALYSIS CATHETER Physician: Stephan Minister. Henn, MD MEDICATIONS: None ANESTHESIA/SEDATION: None FLUOROSCOPY TIME:  Fluoroscopy Time: 12 seconds, 2 mGy COMPLICATIONS: None immediate. PROCEDURE: The procedure was explained to the patient. The risks and benefits of the procedure were discussed and the patient's questions were addressed. Informed consent was obtained from the patient. The patient was placed supine on the interventional table. Ultrasound confirmed a patent right internal jugular vein. Ultrasound images were obtained for documentation. The right neck was prepped and draped in a sterile fashion. The right neck was anesthetized with 1% lidocaine. Maximal barrier sterile technique was utilized including caps, mask, sterile gowns, sterile gloves, sterile drape, hand hygiene and skin antiseptic. A small incision was made with #11 blade scalpel. A 21 gauge needle directed into the right internal jugular vein with ultrasound guidance. A micropuncture dilator set was placed. A 16 cm Mahurkar catheter was selected. The catheter was advanced over a wire and positioned at the superior cavoatrial junction. Fluoroscopic images were obtained for documentation. Both dialysis lumens were found to aspirate and flush well. The proper amount of heparin was flushed in both lumens. The central venous lumen was flushed with normal saline. Catheter was sutured to skin. FINDINGS: Catheter tip at the superior cavoatrial junction. IMPRESSION: Successful placement of a right jugular non-tunneled dialysis catheter using ultrasound and fluoroscopic guidance. Electronically Signed   By: Markus Daft M.D.   On: 10/08/2019 17:18   DG CHEST PORT 1 VIEW  Result Date: 10/17/2019 CLINICAL DATA:   End-stage renal disease. EXAM: PORTABLE CHEST 1 VIEW COMPARISON:  October 07, 2019. FINDINGS: Stable cardiomediastinal silhouette. No pneumothorax is noted. Mild bibasilar atelectasis or infiltrates are noted. Right internal jugular catheter is noted with distal tip in expected position of the SVC. Bony thorax is unremarkable. IMPRESSION: Mild bibasilar subsegmental atelectasis or infiltrates. Electronically Signed   By: Marijo Conception M.D.   On: 10/17/2019 13:18   DG Chest Portable 1 View  Result Date: 10/07/2019 CLINICAL DATA:  Pt from home with daughter LSN yesterday at 1500. Daughter saw pt this morning slumped over in a chair to the right side. Pt has been increasingly weak over the past week. EXAM: PORTABLE CHEST 1 VIEW COMPARISON:  01/23/2019 FINDINGS: There are low lung volumes. Mild linear opacity noted at the right lung base consistent with atelectasis. Lungs otherwise clear. No pleural effusion or pneumothorax. Cardiac silhouette is normal in size. No mediastinal or hilar masses. Skeletal structures are grossly intact. IMPRESSION: No acute cardiopulmonary disease. Electronically Signed   By: Lajean Manes M.D.   On: 10/07/2019 09:57   IR THROMBECTOMY AV FISTULA W/THROMBOLYSIS/PTA INC/SHUNT/IMG LEFT  Result Date: 10/09/2019 INDICATION: Clotted graft. History of end-stage renal disease currently receiving dialysis via left upper extremity dialysis graft created approximately 6 months ago. Patient report, patient underwent declot procedure approximately 3 months ago. Patient underwent placement of a  temporary right jugular approach dialysis catheter yesterday, 10/08/2019 for urgent dialysis and returns today for attempted image guided left upper extremity dialysis graft thrombolysis. EXAM: 1. FISTULALYSIS 2. ANGIOPLASTY OF VENOUS LIMB AND VENOUS ANASTOMOSIS 3. ULTRASOUND GUIDANCE FOR VENOUS ACCESS COMPARISON:  Image guided right non tunneled dialysis catheter placement-10/08/2019. MEDICATIONS:  Heparin 4000 units IV; TPA 4 mg into graft. CONTRAST:  35 cc Omnipaque 3 ANESTHESIA/SEDATION: Moderate (conscious) sedation was employed during this procedure. A total of Versed 2 mg and Fentanyl 100 mcg was administered intravenously. Moderate Sedation Time: 40 minutes. The patient's level of consciousness and vital signs were monitored continuously by radiology nursing throughout the procedure under my direct supervision. FLUOROSCOPY TIME:  4 minutes, 48 seconds (44 mGy) COMPLICATIONS: None immediate. TECHNIQUE: Informed written consent was obtained from the patient after a discussion of the risks, benefits and alternatives to treatment. Questions regarding the procedure were encouraged and answered. A timeout was performed prior to the initiation of the procedure. On physical examination, the existing left arm dialysis graft was negative for palpable pulse or thrill. The skin overlying the graft was prepped and draped in the usual sterile fashion, and a sterile drape was applied covering the operative field. Maximum barrier sterile technique with sterile gowns and gloves were used for the procedure. Under ultrasound guidance, the dialysis graft was accessed directed towards the venous anastomosis with a micropuncture kit after the overlying soft tissues were anesthetized with 1% lidocaine. An ultrasound image was saved for documentation purposes. The micropuncture sheath was exchange for a 7-French vascular sheath over a guidewire. Over a Benson wire, a Kumpe catheter was advanced centrally and a central venogram was performed. Pull back venogram was performed with the Kumpe catheter. Heparin was administered systemically and TPA was administered via the Kumpe catheter throughout near the entirety of the venous limb. The venous anastomosis and majority of the venous limb was angioplastied with a 6 mm x 4 cm Conquest balloon. An additional access was obtained directed towards the arterial anastomoses with a  micropuncture kit after the overlying soft tissues anesthetized with 1% lidocaine. This allowed for placement of a 6-French vascular sheath. The graft was thrombectomized with several rounds of push-pull mechanical thrombectomy with an occlusion balloon. Flow was restored to the graft as evidenced by restored pulsatility of the graft and brisk blood return from the side arm of the vascular sheath. Shuntograms were performed. Small area of adherent thrombus within the venous stick zone of the upper arm dialysis graft was angioplastied at multiple stations with a 7 mm x 4 cm Conquest balloon. Completion images were obtained. At this point, the procedure was terminated. All wires, catheters and sheaths were removed from the patient. Hemostasis was achieved at both access sites with deployment of a swizzle sutures which will be removed at the patient's next dialysis session. Dressings were placed. The patient tolerated the procedure without immediate postprocedural complication. FINDINGS: The existing left upper extremity AV graft is thrombosed. The venous anastomosis and near the entirety of the venous limb was angioplastied to 6 mm diameter. The graft was successfully thrombectomized using mechanical and pharmacologic means as above. Completion shuntogram demonstrates the venous limb and anastomosis are widely patent. The central venous system, arterial limb and arterial anastomosis are widely patent. IMPRESSION: 1. Technically successful left AV graft thrombolysis. 2. Successful angioplasty of the venous anastomosis and majority of the venous limb to 6 mm diameter. Completion shuntogram demonstrates the venous limb and anastomosis are widely patent. 3. The arterial anastomosis and  arterial limb are widely patent. 4. The central venous system is widely patent. ACCESS: This graft would be amenable to future percutaneous intervention as clinically indicated. Electronically Signed   By: Sandi Mariscal M.D.   On: 10/09/2019  17:32   ECHOCARDIOGRAM COMPLETE  Result Date: 10/07/2019   ECHOCARDIOGRAM REPORT   Patient Name:   Erik JAQUARIOUS GREY Date of Exam: 10/07/2019 Medical Rec #:  [id number]       Height:       67.0 in Accession #:    [id number]      Weight:       204.4 lb Date of Birth:  [birth year]       BSA:          2.04 m Patient Age:    46 years        BP:           129/96 mmHg Patient Gender: M               HR:           77 bpm. Exam Location:  Inpatient Procedure: 2D Echo, Cardiac Doppler and Color Doppler Indications:    CVA  History:        Patient has prior history of Echocardiogram examinations, most                 recent 01/22/2019. Stroke and PAD; Risk Factors:Hypertension,                 Current Smoker and Diabetes. ESRD.  Sonographer:    Dustin Flock Referring Phys: [place]  1. Left ventricular ejection fraction, by visual estimation, is 60 to 65%. The left ventricle has normal function. There is moderately increased left ventricular hypertrophy.  2. Left ventricular diastolic parameters are indeterminate.  3. The left ventricle has no regional wall motion abnormalities.  4. Global right ventricle has mildly reduced systolic function.The right ventricular size is normal. No increase in right ventricular wall thickness.  5. Left atrial size was normal.  6. Right atrial size was normal.  7. The mitral valve is normal in structure. Trivial mitral valve regurgitation.  8. The tricuspid valve is normal in structure.  9. The tricuspid valve is normal in structure. Tricuspid valve regurgitation is trivial. 10. The aortic valve is tricuspid. Aortic valve regurgitation is not visualized. Mild to moderate aortic valve sclerosis/calcification without any evidence of aortic stenosis. 11. The pulmonic valve was grossly normal. Pulmonic valve regurgitation is not visualized. 12. Moderately elevated pulmonary artery systolic pressure. 13. No intracardiac source of embolism detected on this transthoracic study.  A transesophageal echocardiogram is recommended to exclude cardiac source of embolism if clinically indicated. 14. The atrial septum is grossly normal. FINDINGS  Left Ventricle: Left ventricular ejection fraction, by visual estimation, is 60 to 65%. The left ventricle has normal function. The left ventricle has no regional wall motion abnormalities. There is moderately increased left ventricular hypertrophy. Concentric left ventricular hypertrophy. Left ventricular diastolic parameters are indeterminate. Right Ventricle: The right ventricular size is normal. No increase in right ventricular wall thickness. Global RV systolic function is has mildly reduced systolic function. The tricuspid regurgitant velocity is 3.49 m/s, and with an assumed right atrial pressure of 8 mmHg, the estimated right ventricular systolic pressure is moderately elevated at 56.7 mmHg. Left Atrium: Left atrial size was normal in size. Right Atrium: Right atrial size was normal in size Pericardium: There is no evidence of pericardial effusion. Mitral Valve:  The mitral valve is normal in structure. There is mild thickening of the mitral valve leaflet(s). There is mild calcification of the mitral valve leaflet(s). Trivial mitral valve regurgitation. Tricuspid Valve: The tricuspid valve is normal in structure. Tricuspid valve regurgitation is trivial. Aortic Valve: The aortic valve is tricuspid. . There is mild thickening and moderate calcification of the aortic valve. Aortic valve regurgitation is not visualized. Mild to moderate aortic valve sclerosis/calcification is present, without any evidence of aortic stenosis. There is mild thickening of the aortic valve. There is moderate calcification of the aortic valve. Pulmonic Valve: The pulmonic valve was grossly normal. Pulmonic valve regurgitation is not visualized. Pulmonic regurgitation is not visualized. Aorta: The aortic root and ascending aorta are structurally normal, with no evidence of  dilitation. Pulmonary Artery: The pulmonary artery is not well seen. Venous: The inferior vena cava was not well visualized. IAS/Shunts: The atrial septum is grossly normal. Additional Comments: No intracardiac source of embolism detected on this transthoracic study. A transesophageal echocardiogram is recommended to exclude cardiac source of embolism if clinically indicated.  LEFT VENTRICLE PLAX 2D LVIDd:         3.93 cm  Diastology LVIDs:         2.59 cm  LV e' lateral:   18.40 cm/s LV PW:         1.51 cm  LV E/e' lateral: 3.5 LV IVS:        1.69 cm  LV e' medial:    14.50 cm/s LVOT diam:     2.50 cm  LV E/e' medial:  4.4 LV SV:         43 ml LV SV Index:   20.12 LVOT Area:     4.91 cm  RIGHT VENTRICLE RV Basal diam:  3.00 cm RV S prime:     9.90 cm/s TAPSE (M-mode): 1.1 cm LEFT ATRIUM             Index       RIGHT ATRIUM           Index LA diam:        4.30 cm 2.11 cm/m  RA Area:     18.70 cm LA Vol (A2C):   37.0 ml 18.13 ml/m RA Volume:   46.30 ml  22.69 ml/m LA Vol (A4C):   52.0 ml 25.48 ml/m LA Biplane Vol: 44.3 ml 21.71 ml/m  AORTIC VALVE LVOT Vmax:   84.90 cm/s LVOT Vmean:  57.100 cm/s LVOT VTI:    0.158 m  AORTA Ao Root diam: 3.50 cm MITRAL VALVE                        TRICUSPID VALVE MV Area (PHT): 3.54 cm             TR Peak grad:   48.7 mmHg MV PHT:        62.06 msec           TR Vmax:        349.00 cm/s MV Decel Time: 214 msec MV E velocity: 63.70 cm/s 103 cm/s  SHUNTS MV A velocity: 90.00 cm/s 70.3 cm/s Systemic VTI:  0.16 m MV E/A ratio:  0.71       1.5       Systemic Diam: 2.50 cm  Buford Dresser MD Electronically signed by Buford Dresser MD Signature Date/Time: 10/07/2019/3:31:07 PM    Final    CT HEAD CODE STROKE WO CONTRAST  Result Date: 10/07/2019  CLINICAL DATA:  Code stroke. Focal neuro deficit, greater than 6 hours, stroke suspected, aphasia and facial droop. EXAM: CT HEAD WITHOUT CONTRAST TECHNIQUE: Contiguous axial images were obtained from the base of the skull  through the vertex without intravenous contrast. COMPARISON:  No pertinent prior studies available for comparison. FINDINGS: Brain: No evidence of acute intracranial hemorrhage. No demarcated cortical infarction. No evidence of intracranial mass. No midline shift or extra-axial fluid collection. Mild generalized parenchymal atrophy. Vascular: No hyperdense vessel.  Atherosclerotic calcifications. Skull: Normal. Negative for fracture or focal lesion. Sinuses/Orbits: Visualized orbits demonstrate no acute abnormality. No significant paranasal sinus disease or mastoid effusion at the imaged levels. These results were communicated to Dr. Rory Percy at [place] 1/27/2021by text page via the [facility] messaging system. IMPRESSION: No CT evidence of acute intracranial abnormality. Mild generalized parenchymal atrophy. Electronically Signed   By: Kellie Simmering DO   On: 10/07/2019 09:16   CT CEREBRAL PERFUSION W CM (CHARM STUDY)  Result Date: 10/07/2019 CLINICAL DATA:  Code stroke, follow-up EXAM: CT ANGIOGRAPHY HEAD AND NECK CT PERFUSION BRAIN TECHNIQUE: Multidetector CT imaging of the head and neck was performed using the standard protocol during bolus administration of intravenous contrast. Multiplanar CT image reconstructions and MIPs were obtained to evaluate the vascular anatomy. Carotid stenosis measurements (when applicable) are obtained utilizing NASCET criteria, using the distal internal carotid diameter as the denominator. Multiphase CT imaging of the brain was performed following IV bolus contrast injection. Subsequent parametric perfusion maps were calculated using RAPID software. CONTRAST:  116m OMNIPAQUE IOHEXOL 350 MG/ML SOLN COMPARISON:  None. FINDINGS: CTA NECK FINDINGS Aortic arch: Great vessel origins are patent. There is right origin of the left vertebral artery from the arch, with calcified plaque noted at the origin. Right carotid system: Patent. There is calcified and noncalcified plaque along the  proximal ICA causing 75% stenosis. Left carotid system: Patent. There is calcified plaque at the ICA origin causing minimal stenosis. Vertebral arteries: Patent. Calcified plaque at both vertebral artery origins without evidence of flow limitation. Right vertebral artery is dominant. Skeleton: Degenerative changes. Other neck: No mass or adenopathy. Upper chest: No apical lung mass. Review of the MIP images confirms the above findings CTA HEAD FINDINGS Anterior circulation: Intracranial internal carotid arteries are patent with calcified plaque causing mild stenosis. Anterior and middle cerebral arteries are patent. Posterior circulation: Intracranial vertebral arteries are patent with calcified plaque causing moderate stenosis on the left. Basilar artery is patent. Posterior cerebral arteries are patent with bilateral posterior communicating arteries present. Venous sinuses: As permitted by contrast timing, patent. Review of the MIP images confirms the above findings CT Brain Perfusion Findings: CBF (<30%) Volume: 041mPerfusion (Tmax>6.0s) volume: 37m83mismatch Volume: 37mL66mfarction Location: None IMPRESSION: No large vessel occlusion. No evidence of core infarction or territory at risk by perfusion imaging. Plaque at the proximal right ICA causes 75% stenosis. There is minimal stenosis of the left ICA origin. Electronically Signed   By: PranMacy Mis.   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  EXAM: INTRAVSC STENT CERV CAROTID W/ EMB-PROT  COMPARISON:  CT angiogram of the head and neck of October 07, 2019.  MEDICATIONS: Heparin 4,000 units IV; Ancef 2  g IV antibiotic was administered within 1 hour of  the procedure.  ANESTHESIA/SEDATION: General anesthesia as patient was unable to lay still with sedation.  CONTRAST:  Isovue 300 approximately 85 mL.  FLUOROSCOPY TIME:  Fluoroscopy Time: 28 minutes 0 seconds (1948 mGy).  COMPLICATIONS: None immediate.  TECHNIQUE: Informed written consent was obtained from the patient after a thorough discussion of the procedural risks, benefits and alternatives. All questions were addressed. Maximal Sterile Barrier Technique was utilized including caps, mask, sterile gowns, sterile gloves, sterile drape, hand hygiene and skin antiseptic. A timeout was performed prior to the initiation of the procedure.  The right groin was prepped and draped in the usual sterile fashion. Thereafter using modified Seldinger technique, transfemoral access into the right common femoral artery was obtained without difficulty. Over a 0.035 inch guidewire, a 5 French Pinnacle sheath was inserted. Through this, and also over 0.035 inch guidewire, a 5 [place] JB 1 catheter was advanced to the aortic arch region and selectively positioned in the innominate artery, right common carotid artery, and left common carotid artery.  FINDINGS: The innominate artery arteriogram demonstrates wide patency of the right subclavian artery and the right common carotid artery proximally. The origin of the right vertebral artery appears mildly narrowed. More distally, the vessel is seen to opacify to the cranial skull base to the level of the right posterior-inferior cerebellar artery.  The right common carotid arteriogram demonstrates the right external carotid artery and its major branches to be widely patent.  The right internal carotid artery on the AP projection demonstrates approximately 75% stenosis secondary to a smooth segmental plaque. More distally, there is mild post stenotic dilatation. The distal right ICA petrous cavernous and supraclinoid segments are widely patent.  There is mild narrowing of the  proximal right middle cerebral M1 segment. The right MCA distally and the right anterior cerebral artery opacify into the capillary and venous.  There is prompt transient opacification via the anterior communicating artery of the left anterior cerebral A2 segment and distally.  The left common carotid arteriogram demonstrates the left external carotid artery and its major branches to be widely patent.  The left internal carotid artery at the bulb inferiorly demonstrates a smooth shallow plaque without evidence of intraluminal filling defects or of aspirations. More distally, the left internal carotid artery opacifies to the cranial skull base. The petrous, cavernous and the supraclinoid segments are widely patent.  The left posterior communicating artery demonstrates partial transverse filling of the left posterior cerebral artery distribution.  The left middle and the left anterior cerebral artery opacify into the capillary and venous phases.  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  The guidewire was removed. Good aspiration was obtained from the hub of the balloon guide catheter. Gentle contrast injection demonstrated no evidence of spasms, dissections or of intraluminal filling defects distal to the tip of the balloon guide.  Measurements were performed of the right internal carotid artery in its normal diameter portion proximally and also distally and also of the right common carotid artery.  A 4-7 mm NAV Emboshield device was then flushed with heparinized saline infusion to clear the filter  and of the air, and  also the of the delivery microcatheter. The filter was then captured into the delivery device in the usual manner. The combination of the delivery microcatheter, with the filter and the delivery 014 inch micro guidewire was then retrieved in a J configuration given to the micro guidewire.  Thereafter, using biplane roadmap technique and constant fluoroscopic guidance in a coaxial manner and with constant heparinized saline infusion the filter delivery microcatheter with the micro guidewire were then advanced to the right internal carotid artery.  Using a torque device, the micro guidewire was advanced without difficulty followed by the filter catheter delivery device to the distal end of the right internal carotid artery. The micro guidewire was positioned in the horizontal segment of the petrous portion.  The filter device was then deployed in the usual manner, the filter delivery microcatheter was retrieved and removed with the filter kept in stable position with intermittent fluoroscopy.  A control arteriogram performed through the 8 French balloon guide in the right common carotid artery demonstrated safe position of the tip of the micro guidewire, and also the filter. No evidence of dissections or of intraluminal filling defects or vasospasm was seen.  A 6 mm/8 mm x 40 mm Xact stent delivery system was then retrogradely prepped with heparinized saline infusion.  Thereafter using the rapid exchange technique, the stent delivery apparatus was then advanced without difficulty over the micro guidewire. The proximal and the distal landing zones were then positioned adequate distant from the site of the severe stenosis. The stent was then deployed in the usual manner without difficulty. The delivery apparatus was retrieved and removed. A control arteriogram performed through the 8 [place] FlowGate guide catheter in the right common carotid artery demonstrated now significantly improved caliber and flow through  the stented segment. There continued to be a waist of this stent which necessitated the advancement of a 5 mm x 30 mm Viatrac 14 angioplasty balloon microcatheter which had been prepped with heparinized saline infusion. Again using the rapid exchange technique, the angioplasty balloon was advanced and positioned adequate distant from the site of the waist. A control inflation was then performed using a micro inflation syringe device via micro tubing. Balloon was maintained for approximately 10 seconds. Thereafter, the balloon was deflated and retrieved and removed. A control arteriogram performed through the [facility] guide catheter in the right common carotid artery demonstrates significant improved caliber and flow through the stented segment.  There continued to be a small dog ear at the distal end of the stent. However, this remained stable throughout the procedure.  Control arteriograms were then performed at 15 and 30 minutes post angioplasty which continued to demonstrate excellent flow through the stented segment, and also intracranially without evidence of a filling defects or of occlusions. In view of the patient's long segment stenosis, the patient was given a total of 10.5 mg of intra-arterial Integrilin to obviate any complication of platelets at this site within the stent.  Also, the patient's ACT was maintained in the region of approximately 200 seconds throughout the procedure.  The filter was then captured using the filter capture device again using the rapid exchange technique.  Control arteriogram performed through the [facility] shuttle sheath in the right common carotid artery demonstrates significantly improved caliber and flow through the stented segment of the right intracranial proximally without evidence of intra stent irregularities or filling defects.  Placement of a second stent in the distal first stent was not undertaken in view  of the increased risk of possible inducing platelet  aggregation.  The balloon guide was then retrieved and removed. The 8 French Pinnacle sheath was then removed with the successful application of an 8 French Angio-Seal closure device for hemostasis in the right groin. Distal pulses remained Dopplerable in the posterior tibial, and the dorsalis pedis arteries bilaterally unchanged.  A flat panel CT of the brain demonstrated no evidence of intracranial hemorrhage or mass effect. The  patient's general anesthesia was then reversed, and the patient was then extubated without difficulty. The patient was slow in awakening from the general anesthetic. Eventually, however, the patient was able to respond appropriately and demonstrated orientation to place and year.  He denied any headaches or nausea or vomiting.  He was able to move all his 4 extremities without difficulty. He was then transferred to the neuro PACU and then neuro ICU for overnight close neurologic observations, continue on the low-dose IV heparin, and also management of blood pressure.  Overnight, the patient's neurological status remained stable. The following morning, the IV heparin was stopped and the patient was switched to Brilinta 90 mg b.i.d., with aspirin 85 mg a day. The patient was advised to sit up and attempt eating his breakfast.  The right groin appeared soft with distal pulses remaining Dopplerable unchanged bilaterally.  Instructions were given to the patient to continue with his dual medications. A follow-up in the clinic in 4 weeks will be undertaken with a probable ultrasound of the neck to evaluate the carotid stent status approximately 3 months from the time of the treatment.  The patient expressed understanding. The patient was deemed to be stable medically to be transferred to the primary service.  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  PLAN: Follow-up in the clinic 4 weeks post discharge, with ultrasound of the neck in 3 months from the time of the treatment.   Electronically Signed   By: Luanne Bras M.D.   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  EXAM: INTRAVSC STENT CERV CAROTID W/ EMB-PROT  COMPARISON:  CT angiogram of the head and neck of October 07, 2019.  MEDICATIONS: Heparin 4,000 units IV; Ancef 2 g IV antibiotic was administered within 1 hour of the procedure.  ANESTHESIA/SEDATION: General anesthesia as patient was unable to lay still with sedation.  CONTRAST:  Isovue 300 approximately 85 mL.  FLUOROSCOPY TIME:  Fluoroscopy Time: 28 minutes 0 seconds (1948 mGy).  COMPLICATIONS: None immediate.  TECHNIQUE: Informed written consent was obtained from the patient after a thorough discussion of the procedural risks, benefits and alternatives. All questions were addressed. Maximal Sterile Barrier Technique was utilized including caps, mask, sterile gowns, sterile gloves, sterile drape, hand hygiene and skin antiseptic. A timeout was performed prior to the initiation of the procedure.  The right groin was prepped and draped in the usual sterile fashion. Thereafter using modified Seldinger technique, transfemoral access into the right common femoral artery was obtained without difficulty. Over a 0.035 inch guidewire, a 5 French Pinnacle sheath was inserted. Through this, and also over 0.035 inch guidewire, a 5 [place] JB 1 catheter was advanced to  the aortic arch region and selectively positioned in the innominate artery, right common carotid artery, and left common carotid artery.  FINDINGS: The innominate  artery arteriogram demonstrates wide patency of the right subclavian artery and the right common carotid artery proximally. The origin of the right vertebral artery appears mildly narrowed. More distally, the vessel is seen to opacify to the cranial skull base to the level of the right posterior-inferior cerebellar artery.  The right common carotid arteriogram demonstrates the right external carotid artery and its major branches to be widely patent.  The right internal carotid artery on the AP projection demonstrates approximately 75% stenosis secondary to a smooth segmental plaque. More distally, there is mild post stenotic dilatation. The distal right ICA petrous cavernous and supraclinoid segments are widely patent.  There is mild narrowing of the proximal right middle cerebral M1 segment. The right MCA distally and the right anterior cerebral artery opacify into the capillary and venous.  There is prompt transient opacification via the anterior communicating artery of the left anterior cerebral A2 segment and distally.  The left common carotid arteriogram demonstrates the left external carotid artery and its major branches to be widely patent.  The left internal carotid artery at the bulb inferiorly demonstrates a smooth shallow plaque without evidence of intraluminal filling defects or of aspirations. More distally, the left internal carotid artery opacifies to the cranial skull base. The petrous, cavernous and the supraclinoid segments are widely patent.  The left posterior communicating artery demonstrates partial transverse filling of the left posterior cerebral artery distribution.  The left middle and the left anterior cerebral artery opacify into the capillary and venous phases.  ENDOVASCULAR REVASCULARIZATION OF SYMPTOMATIC HIGH-GRADE STENOSIS OF THE RIGHT INTERNAL CAROTID ARTERY PROXIMALLY WITH STENT ASSISTED ANGIOPLASTY WITH DISTAL PROTECTION  The diagnostic JB 1 catheter in the right common  carotid artery was then exchanged over an 8 French Pinnacle sheath in the right groin for an 8 French 85 cm FlowGate balloon guide catheter which had been prepped with 50% contrast and 50% heparinized saline infusion.  The guidewire was removed. Good aspiration was obtained from the hub of the balloon guide catheter. Gentle contrast injection demonstrated no evidence of spasms, dissections or of intraluminal filling defects distal to the tip of the balloon guide.  Measurements were performed of the right internal carotid artery in its normal diameter portion proximally and also distally and also of the right common carotid artery.  A 4-7 mm NAV Emboshield device was then flushed with heparinized saline infusion to clear the filter and of the air, and also the of the delivery microcatheter. The filter was then captured into the delivery device in the usual manner. The combination of the delivery microcatheter, with the filter and the delivery 014 inch micro guidewire was then retrieved in a J configuration given to the micro guidewire.  Thereafter, using biplane roadmap technique and constant fluoroscopic guidance in a coaxial manner and with constant heparinized saline infusion the filter delivery microcatheter with the micro guidewire were then advanced to the right internal carotid artery.  Using a torque device, the micro guidewire was advanced without difficulty followed by the filter catheter delivery device to the distal end of the right internal carotid artery. The micro guidewire was positioned in the horizontal segment of the petrous portion.  The filter device was then deployed in the usual manner, the filter delivery microcatheter was retrieved and removed with the filter kept in stable position with intermittent fluoroscopy.  A control  arteriogram performed through the 8 French balloon guide in the right common carotid artery demonstrated safe position of the tip of the micro guidewire, and also  the filter. No evidence of dissections or of intraluminal filling defects or vasospasm was seen.  A 6 mm/8 mm x 40 mm Xact stent delivery system was then retrogradely prepped with heparinized saline infusion.  Thereafter using the rapid exchange technique, the stent delivery apparatus was then advanced without difficulty over the micro guidewire. The proximal and the distal landing zones were then positioned adequate distant from the site of the severe stenosis. The stent was then deployed in the usual manner without difficulty. The delivery apparatus was retrieved and removed. A control arteriogram performed through the 8 [place] FlowGate guide catheter in the right common carotid artery demonstrated now significantly improved caliber and flow through the stented segment. There continued to be a waist of this stent which necessitated the advancement of a 5 mm x 30 mm Viatrac 14 angioplasty balloon microcatheter which had been prepped with heparinized saline infusion. Again using the rapid exchange technique, the angioplasty balloon was advanced and positioned adequate distant from the site of the waist. A control inflation was then performed using a micro inflation syringe device via micro tubing. Balloon was maintained for approximately 10 seconds. Thereafter, the balloon was deflated and retrieved and removed. A control arteriogram performed through the [facility] guide catheter in the right common carotid artery demonstrates significant improved caliber and flow through the stented segment.  There continued to be a small dog ear at the distal end of the stent. However, this remained stable throughout the procedure.  Control arteriograms were then performed at 15 and 30 minutes post angioplasty which continued to demonstrate excellent flow through the stented segment, and also intracranially without evidence of a filling defects or of occlusions. In view of the patient's long segment stenosis, the patient was  given a total of 10.5 mg of intra-arterial Integrilin to obviate any complication of platelets at this site within the stent.  Also, the patient's ACT was maintained in the region of approximately 200 seconds throughout the procedure.  The filter was then captured using the filter capture device again using the rapid exchange technique.  Control arteriogram performed through the [facility] shuttle sheath in the right common carotid artery demonstrates significantly improved caliber and flow through the stented segment of the right intracranial proximally without evidence of intra stent irregularities or filling defects.  Placement of a second stent in the distal first stent was not undertaken in view of the increased risk of possible inducing platelet aggregation.  The balloon guide was then retrieved and removed. The 8 French Pinnacle sheath was then removed with the successful application of an 8 French Angio-Seal closure device for hemostasis in the right groin. Distal pulses remained Dopplerable in the posterior tibial, and the dorsalis pedis arteries bilaterally unchanged.  A flat panel CT of the brain demonstrated no evidence of intracranial hemorrhage or mass effect. The  patient's general anesthesia was then reversed, and the patient was then extubated without difficulty. The patient was slow in awakening from the general anesthetic. Eventually, however, the patient was able to respond appropriately and demonstrated orientation to place and year.  He denied any headaches or nausea or vomiting.  He was able to move all his 4 extremities without difficulty. He was then transferred to the neuro PACU and then neuro ICU for overnight close neurologic observations, continue on the low-dose IV heparin, and also  management of blood pressure.  Overnight, the patient's neurological status remained stable. The following morning, the IV heparin was stopped and the patient was switched to Brilinta 90 mg b.i.d., with  aspirin 85 mg a day. The patient was advised to sit up and attempt eating his breakfast.  The right groin appeared soft with distal pulses remaining Dopplerable unchanged bilaterally.  Instructions were given to the patient to continue with his dual medications. A follow-up in the clinic in 4 weeks will be undertaken with a probable ultrasound of the neck to evaluate the carotid stent status approximately 3 months from the time of the treatment.  The patient expressed understanding. The patient was deemed to be stable medically to be transferred to the primary service.  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  PLAN: Follow-up in the clinic 4 weeks post discharge, with ultrasound of the neck in 3 months from the time of the treatment.   Electronically Signed   By: Luanne Bras M.D.   On: 10/16/2019 09:31    Subjective: He denies chest pain or worsening dyspnea.  Still with productive Cough.  He was confuse last night. Confusion on and off during Hospitalization.   Discharge Exam: Vitals:   10/24/19 0739 10/24/19 1115  BP: 129/74 (!) 145/73  Pulse: 91 64  Resp: 19 18  Temp: 98.3 F (36.8 C) 98.5 F (36.9 C)  SpO2: 97% 96%     General: Pt is alert, awake, not in acute distress Cardiovascular: RRR, S1/S2 +, no rubs, no gallops Respiratory: CTA bilaterally, no wheezing, no rhonchi Abdominal: Soft, NT, ND, bowel sounds + Extremities: no edema, no cyanosis    The results of significant diagnostics from this hospitalization (including imaging, microbiology, ancillary and laboratory) are listed below for reference.     Microbiology: Recent Results (from the past 240 hour(s))  Surgical pcr screen     Status: None   Collection Time: 10/15/19  5:15 AM   Specimen: Nasal Mucosa; Nasal Swab  Result Value Ref Range Status   MRSA,  PCR NEGATIVE NEGATIVE Final   Staphylococcus aureus NEGATIVE NEGATIVE Final    Comment: (NOTE) The Xpert SA Assay (FDA approved for NASAL specimens in patients 18 years of age and older), is one component of a comprehensive surveillance program. It is not intended to diagnose infection nor to guide or monitor treatment. Performed at [facility], [place] [address] [place], [place] [address]   SARS CORONAVIRUS 2 (TAT 6-24 HRS) Nasopharyngeal Nasopharyngeal Swab     Status: None   Collection Time: 10/23/19  9:06 AM   Specimen: Nasopharyngeal Swab  Result Value Ref Range Status   SARS Coronavirus 2 NEGATIVE NEGATIVE Final    Comment: (NOTE) SARS-CoV-2 target nucleic acids are NOT DETECTED. The SARS-CoV-2 RNA is generally detectable in upper and lower respiratory specimens during the acute phase of infection. Negative results do not preclude SARS-CoV-2 infection, do not rule out co-infections with other pathogens, and should not be used as the sole basis for treatment or other patient management decisions. Negative results must be combined with clinical observations, patient history, and epidemiological information. The expected result is Negative. Fact Sheet for Patients: SugarRoll.be Fact Sheet for Healthcare Providers: https://www.woods-mathews.com/ This test is not yet approved or cleared by the [place] FDA and  has been authorized for detection and/or diagnosis of SARS-CoV-2 by FDA under an Emergency Use Authorization (EUA).  This EUA will remain  in effect (meaning this test can be used) for the duration of the COVID-19 declaration under Section 56 4(b)(1) of the Act, 21 U.S.C. section 360bbb-3(b)(1), unless the authorization is terminated or revoked sooner. Performed at [facility], [place] [address], [place], [place] [address]      Labs: BNP (last 3 results) No results for input(s): BNP in the last 8760 hours. Basic  Metabolic Panel: Recent Labs  Lab 10/18/19 0446 10/19/19 0126 10/21/19 0255 10/21/19 1806 10/22/19 0228 10/23/19 1016 10/24/19 0758  NA 137   < > 137 140 139 139 142  K 3.5   < > 3.3* 3.9 3.9 4.2 4.2  CL 97*   < > 98 96* 99 101 102  CO2 25   < > _0 GLUCOSE 141*   < > 127* 150* 131* 153* 139*  BUN 17   < > 23 31* 34* 21 32*  CREATININE 4.12*   < > 5.11* 6.08* 6.77* 5.45* 6.82*  CALCIUM 8.8*   < > 8.9 9.5 9.2 9.2 9.5  MG 1.7  --   --  1.9  --  2.3  --    < > = values in this interval not displayed.   Liver Function Tests: No results for input(s): AST, ALT, ALKPHOS, BILITOT, PROT, ALBUMIN in the last 168 hours. No results for input(s): LIPASE, AMYLASE in the last 168 hours. Recent Labs  Lab 10/18/19 1908  AMMONIA 24   CBC: Recent Labs  Lab 10/20/19 0557 10/20/19 0557 10/21/19 0255 10/22/19 0228 10/22/19 1832 10/23/19 0213 10/24/19 0758  WBC 12.0*  --  14.7* 11.9*  --  10.6* 12.5*  HGB 10.5*   < > 10.8* 10.7* 11.0* 10.0* 10.3*  HCT 32.5*   < > 34.0* 34.0* 34.2* 32.1* 33.0*  MCV 100.0  --  101.5* 100.9*  --  104.2* 104.1*  PLT 290  --  330 310  --  331 336   < > = values in this interval not displayed.   Cardiac Enzymes: No results for input(s): CKTOTAL, CKMB, CKMBINDEX, TROPONINI in the last 168 hours. BNP: Invalid input(s): POCBNP CBG: Recent Labs  Lab 10/23/19 1253 10/23/19 1539 10/23/19 2142 10/24/19 0628 10/24/19 1117  GLUCAP 153* 121* 156* 119* 131*   D-Dimer No results for input(s): DDIMER in the last 72 hours. Hgb A1c No results for input(s): HGBA1C in the last 72 hours. Lipid Profile No results for input(s): CHOL, HDL, LDLCALC, TRIG, CHOLHDL, LDLDIRECT in the last 72 hours. Thyroid function studies No results for input(s): TSH, T4TOTAL, T3FREE, THYROIDAB in the last 72 hours.  Invalid input(s): FREET3 Anemia work up No results for input(s): VITAMINB12, FOLATE, FERRITIN, TIBC, IRON, RETICCTPCT in the last 72 hours. Urinalysis     Component Value Date/Time   COLORURINE YELLOW 10/21/2019 1054   APPEARANCEUR CLEAR 10/21/2019 1054   APPEARANCEUR Turbid (A) 05/11/2019 1037   LABSPEC 1.006 10/21/2019 1054   PHURINE 8.0 10/21/2019 1054   GLUCOSEU 150 (A) 10/21/2019 1054   HGBUR SMALL (A) 10/21/2019 1054   BILIRUBINUR NEGATIVE 10/21/2019 1054   BILIRUBINUR Negative 05/11/2019 [place] 10/21/2019 1054   PROTEINUR 100 (A) 10/21/2019 1054   NITRITE NEGATIVE 10/21/2019 1054   LEUKOCYTESUR NEGATIVE 10/21/2019 1054   Sepsis Labs Invalid input(s): PROCALCITONIN,  WBC,  LACTICIDVEN Microbiology Recent Results (from the past 240 hour(s))  Surgical pcr screen     Status: None   Collection Time: 10/15/19  5:15 AM   Specimen: Nasal Mucosa; Nasal Swab  Result Value Ref Range Status   MRSA, PCR NEGATIVE NEGATIVE Final   Staphylococcus aureus NEGATIVE NEGATIVE Final    Comment: (NOTE) The Xpert SA Assay (FDA approved for NASAL specimens in patients 43 years of age and older), is one component of a comprehensive surveillance program. It is not intended to diagnose infection nor to guide or monitor treatment. Performed at [facility], [place] [address] [place], [place] [address]   SARS CORONAVIRUS 2 (TAT 6-24 HRS) Nasopharyngeal Nasopharyngeal Swab     Status: None   Collection Time: 10/23/19  9:06 AM   Specimen: Nasopharyngeal Swab  Result Value Ref Range Status   SARS Coronavirus 2 NEGATIVE NEGATIVE Final    Comment: (NOTE) SARS-CoV-2 target nucleic acids are NOT DETECTED. The SARS-CoV-2 RNA is generally detectable in upper and lower respiratory specimens during the acute phase of infection. Negative results do not preclude SARS-CoV-2 infection, do not rule out co-infections with other pathogens, and should not be used as the sole basis for treatment or other patient management decisions. Negative results must be combined with clinical observations, patient history, and epidemiological  information. The expected result is Negative. Fact Sheet for Patients: SugarRoll.be Fact Sheet for Healthcare Providers: https://www.woods-mathews.com/ This test is not yet approved or cleared by the [place] FDA and  has been authorized for detection and/or diagnosis of SARS-CoV-2 by FDA under an Emergency Use Authorization (EUA). This EUA will remain  in effect (meaning this test can be used) for the duration of the COVID-19 declaration under Section 56 4(b)(1) of the Act, 21 U.S.C. section 360bbb-3(b)(1), unless the authorization is terminated or revoked sooner. Performed at [facility], [place] [address], [place], [place] [address]      Time coordinating discharge: 40 minutes  SIGNED:   Elmarie Shiley, MD  [facility]

## 2019-10-24 NOTE — Progress Notes (Signed)
1845 PTAR due at 1700 but finally picked pt up at 1845. Daughter present. Pt in no distress Report called to facility hours ago

## 2019-10-24 NOTE — Progress Notes (Signed)
Pt down for dialysis via bed

## 2019-10-26 ENCOUNTER — Telehealth: Payer: Self-pay | Admitting: Internal Medicine

## 2019-10-26 NOTE — Telephone Encounter (Signed)
Patient is at [facility] place for Rehab stroke, will need to cancel this appt due to rehab.  Patient per daughter is still disoriented at times and will be I rehab at [facility] 6 weeks. During this time facility doctors will see patient for care.

## 2019-10-27 LAB — VITAMIN B1: Vitamin B1 (Thiamine): 151.4 nmol/L (ref 66.5–200.0)

## 2019-10-28 ENCOUNTER — Ambulatory Visit: Payer: Medicare Other | Admitting: Internal Medicine

## 2019-11-01 NOTE — Progress Notes (Deleted)
Cardiology Office Note Date:  11/01/2019  Patient ID:  Frank [name], Frank [name] 01-Aug-1941, MRN [id number] PCP:  McLean-Scocuzza, Nino Glow, MD  Electrophysiologist:  Dr. Rayann Heman Nephrologist: Dr. Posey Pronto   This visit type was conducted due to national recommendations for restrictions regarding the COVID-19 Pandemic (e.g. social distancing) in an effort to limit this patient's exposure and mitigate transmission in our community.  Due to her co-morbid illnesses, this patient is at least at moderate risk for complications without adequate follow up.  This format is felt to be most appropriate for this patient at this time.  The patient did not have access to video technology/had technical difficulties with video requiring transitioning to audio format only (telephone).  All issues noted in this document were discussed and addressed.  No physical exam could be performed with this format.  Please refer to the patient's chart for her  consent to telehealth for [facility].   Patient Location:  *** Provider Location: Office   Chief Complaint:  *** planned follow up, post hospital  History of Present Illness: DRESHAWN [name] is a 79 y.o. 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 Rayann Heman.  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  He saw [place] [place], [place] Nov 2020, for hypotension with HD and some non-HD days as well, and reprts of bradycardia I the 40's50's.  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  Discussed midodrine may exacerbate bradycardia.  The patient and his daughter (an Therapist, sports), wanted to try the midodrine with plans to  have follow up for titration if needed, and/or discuss PPM further.    I saw him 09/16/2019  via speaker phone with the patient who identifies himself by name and birthdate and his daughter Frank [name] (a Nature conservation officer). The visit transitioned to tele-health visit 2/2 unfortunate COVID exposure at [facility] last week, his COVID test result done is pending, he has no symptoms of COVID.  Since starting the ProAmatine, his dose was adjusted up to 5mg  (1/2 hour prior to HD on (HD days only) by Dr. Posey Pronto and has been on this dose since 07/28/2019. The patient did not think that there has been a marked improvement, that most sessions they still have to slow or stop his HD for a little bit prior to finishing his session.  When his BP gets to about 100/40's he feels very lightheaded feels terrible.  They have not reported, he has not noted any BP as low as they were before, nothing <90SBP  His daughter reported vitals given to her by HD nurse from the last few sessions mentioning they told her his HR's generally 70's-80's during the sessions, once was 54 at the start. Pre HD, 122/59, 121/57, today 121/54 Post HD 104/57, 103/45, today 100/56 Off HD days at home 121/45, 120/50, 119/45, once 95/51 (no syncope, but felt weak) In review of his HR on his home monitor, none < 60, most 70's  He denied any CP, palpitations, no SOB.  His Mobitz I/conduction system disease went back years and no reports of any significant bradycardia noted, planned to increase his ProAmatine  to 10mg  prior to HD sessions only  TODAY's visit   Unfortunately 10/07/2019 he suffered a stroke, (b/l watershed), work up felt 2/2 chronic hypotension and noted R ICA disease, this was revascularized with stent.  TTE noted LVEF 60-65%, no source of embolus, he was seen by cardiology during his hospitalization for SB, Mobiz I.  Dr. Rayann Heman included, not felt to ave indication for pacing. During his hospitalization, his ProAmatine increased to  10mg  TID every day (initially required pressor support).   *** symptoms of bradycardia? *** meds, *** no nodal blockers *** rehab    Past Medical History:  Diagnosis Date  . Anemia of chronic kidney failure   . Arthritis   . Bradycardia   . Cataracts, bilateral 05/15/2012  . Chronic idiopathic gout of multiple sites 02/10/2015  . Chronic kidney disease    [facility]  . Depression   . Diabetes mellitus without complication (HCC)    diet controlled   . Diabetic nephropathy ([place])   . ED (erectile dysfunction) 08/28/2012  . Elevated liver enzymes 06/10/2019  . Epiretinal membrane (ERM) of right eye 06/10/2019   Right eye saw AE 06/05/2019    . Essential hypertension 05/07/2013   Last Assessment & Plan:  Formatting of this note might be different from the original. HTN PLAN   BP goal is <140/90 BP Readings from Last 3 Encounters:  08/27/16 140/79  08/13/16 141/80  08/06/16 144/86   Current Anti-Hyptensive Medications: None  Today's Recommendations: Continue lifestyle modifications. Defer starting medication to PCP.  Recommend continued regular exercise as tolerated. Recomm  . History of UTI    s/p foley in hospital 01/2019   . Hyperlipidemia   . Hypothyroidism   . Illiterate 11/19/2013   Last Assessment & Plan:  Assisted patient with Handicapped Placard application today.    . Insomnia 05/08/2019  . Neuropathy   . Peripheral neuropathy   . Peripheral vascular disease ([place])   . Secondary hyperparathyroidism ([place])    Renal  . Thyroid disease     Past Surgical History:  Procedure Laterality Date  . AV FISTULA PLACEMENT Left 11/19/2017   Procedure: ARTERIOVENOUS (AV) FISTULA CREATION;  Surgeon: [name], MD;  Location: [place];  Service: Vascular;  Laterality: Left;  . BASCILIC VEIN TRANSPOSITION Left 06/04/2018   Procedure: BASILIC VEIN TRANSPOSITION ARM;  Surgeon: [name], MD;  Location: [place];  Service: Vascular;  Laterality: Left;  . COLONOSCOPY      . DIALYSIS/PERMA CATHETER REMOVAL N/A 03/23/2019   Procedure: DIALYSIS/PERMA CATHETER REMOVAL;  Surgeon: [name], MD;  Location: [facility];  Service: Cardiovascular;  Laterality: N/A;  . EYE SURGERY     cataract surgery b/l; photophobia since   . INSERTION OF DIALYSIS CATHETER N/A 01/23/2019   Procedure: INSERTION OF DIALYSIS CATHETER;  Surgeon: [name], MD;  Location: [facility];  Service: Vascular;  Laterality: N/A;  INSERTION OF DIALYSIS CATHETER  . IR ANGIO INTRA EXTRACRAN SEL COM CAROTID INNOMINATE UNI L MOD SED  10/15/2019  . IR ANGIO VERTEBRAL SEL SUBCLAVIAN INNOMINATE UNI R MOD SED  10/15/2019  . IR CT HEAD LTD  10/15/2019  . IR FLUORO GUIDE CV LINE RIGHT  10/08/2019  . IR INTRAVSC STENT CERV CAROTID W/EMB-PROT MOD SED INCL ANGIO  10/15/2019  . IR THROMBECTOMY AV FISTULA W/THROMBOLYSIS/PTA INC/SHUNT/IMG LEFT Left 10/09/2019  . IR US GUIDE VASC ACCESS LEFT  10/09/2019  . IR US GUIDE VASC ACCESS RIGHT  10/08/2019  . KNEE  ARTHROSCOPY Left    Knee  . PERIPHERAL VASCULAR THROMBECTOMY Left 07/06/2019   Procedure: PERIPHERAL VASCULAR THROMBECTOMY;  Surgeon: [name], MD;  Location: [place] [facility];  Service: Cardiovascular;  Laterality: Left;  . RADIOLOGY WITH ANESTHESIA N/A 10/15/2019   Procedure: stent placement;  Surgeon: Luanne Bras, MD;  Location: [facility];  Service: Radiology;  Laterality: N/A;  . THROMBECTOMY W/ EMBOLECTOMY Left 01/27/2019   Procedure: THROMBECTOMY Left arm ARTERIOVENOUS FISTULA  and Left arm Arteriovenous gortex graft;  Surgeon: [name], MD;  Location: [place];  Service: Vascular;  Laterality: Left;  Marland Kitchen VENOGRAM Left 01/27/2019   Procedure: Left arm FISTULOGRAM/;  Surgeon: [name], MD;  [facility]: [facility];  Service: Vascular;  Laterality: Left;    Current Outpatient Medications  Medication Sig Dispense Refill  . allopurinol (ZYLOPRIM) 100 MG tablet Take 1 tablet (100 mg total) by mouth daily. 90 tablet 3  . aspirin EC 81 MG tablet  Take 81 mg daily by mouth.    Marland Kitchen atorvastatin (LIPITOR) 20 MG tablet Take 20 mg by mouth daily at 6 PM.     . calcitRIOL (ROCALTROL) 0.5 MCG capsule Take 1 capsule (0.5 mcg total) by mouth Every Tuesday,Thursday,and Saturday with dialysis. 30 capsule 1  . clotrimazole (LOTRIMIN) 1 % cream Apply 1 application topically daily as needed (For yeast, groin rash).    Marland Kitchen docusate sodium (COLACE) 100 MG capsule Take 200 mg by mouth daily as needed.     Marland Kitchen ketoconazole (NIZORAL) 2 % shampoo Apply 1 application topically 2 (two) times a week. (Patient taking differently: Apply 1 application topically as needed for irritation. ) 120 mL 11  . levothyroxine (SYNTHROID) 175 MCG tablet Take 1 tablet (175 mcg total) by mouth daily before breakfast. 30 tablet 0  . lidocaine-prilocaine (EMLA) cream Apply 1 application topically See admin instructions. Apply to access site 1 to 2 hours before dialysis. Cover with occlusive dressing.    . midodrine (PROAMATINE) 10 MG tablet Take 1 tablet (10 mg total) by mouth 3 (three) times daily with meals. 90 tablet 0  . multivitamin (RENA-VIT) TABS tablet Take 1 tablet daily by mouth.    . Nutritional Supplements (FEEDING SUPPLEMENT, NEPRO CARB STEADY,) LIQD Take 237 mLs by mouth 3 (three) times daily with meals. 230 mL 0  . OLANZapine zydis (ZYPREXA) 5 MG disintegrating tablet Take 1 tablet (5 mg total) by mouth daily as needed (delirium). 30 tablet 0  . pantoprazole (PROTONIX) 40 MG tablet Take 1 tablet (40 mg total) by mouth daily. 30 tablet 1  . polyethylene glycol (MIRALAX / GLYCOLAX) 17 g packet Take 17 g by mouth daily. 14 each 0  . QUEtiapine (SEROQUEL) 25 MG tablet Take 0.5 tablets (12.5 mg total) by mouth at bedtime. 30 tablet 0  . sevelamer carbonate (RENVELA) 800 MG tablet Take 3 tablets (2,400 mg total) by mouth 3 (three) times daily with meals. 120 tablet 0  . tamsulosin (FLOMAX) 0.4 MG CAPS capsule Take 1 capsule (0.4 mg total) by mouth daily. 30 capsule 1  .  ticagrelor (BRILINTA) 90 MG TABS tablet Take 1 tablet (90 mg total) by mouth 2 (two) times daily. 60 tablet 3  . Zinc Oxide (BALMEX EX) Apply 1 application topically daily as needed (to irritated areas of skin).      No current facility-administered medications for this visit.    Allergies:   Ace inhibitors, Angiotensin receptor blockers, Ivp dye [iodinated diagnostic agents], and Adhesive [tape]  Social History:  The patient  reports that he quit smoking about 27 years ago. He quit after 30.00 years of use. He has never used smokeless tobacco. He reports that he does not drink alcohol or use drugs.   Family History:  The patient's family history includes Diabetes in his mother; Heart Problems in his mother; Kidney disease in his mother; Liver disease in his mother.  ROS:  Please see the history of present illness.    All other systems are reviewed and otherwise negative.   PHYSICAL EXAM:  VS:  There were no vitals taken for this visit. BMI: There is no height or weight on file to calculate BMI. The patient does not sound SOB, speaks at a normal pace in full sentences.  EKG:  10/23/19: SR 88bpm, marked 1st degree AVBlock, 371ms  10/07/2019: TTE IMPRESSIONS  1. Left ventricular ejection fraction, by visual estimation, is 60 to  65%. The left ventricle has normal function. There is moderately increased  left ventricular hypertrophy.  2. Left ventricular diastolic parameters are indeterminate.  3. The left ventricle has no regional wall motion abnormalities.  4. Global right ventricle has mildly reduced systolic function.The right  ventricular size is normal. No increase in right ventricular wall  thickness.  5. Left atrial size was normal.  6. Right atrial size was normal.  7. The mitral valve is normal in structure. Trivial mitral valve  regurgitation.  8. The tricuspid valve is normal in structure.  9. The tricuspid valve is normal in structure. Tricuspid valve    regurgitation is trivial.  10. The aortic valve is tricuspid. Aortic valve regurgitation is not  visualized. Mild to moderate aortic valve sclerosis/calcification without  any evidence of aortic stenosis.  11. The pulmonic valve was grossly normal. Pulmonic valve regurgitation is  not visualized.  12. Moderately elevated pulmonary artery systolic pressure.  13. No intracardiac source of embolism detected on this transthoracic  study. A transesophageal echocardiogram is recommended to exclude cardiac  source of embolism if clinically indicated.  14. The atrial septum is grossly normal.    01/22/2019: TTE IMPRESSIONS 1. The left ventricle has normal systolic function with an ejection fraction of 60-65%. The cavity size was normal. There is moderate concentric left ventricular hypertrophy. Left ventricular diastolic Doppler parameters are consistent with impaired  relaxation.  2. The right ventricle has normal systolic function. The cavity was normal. There is no increase in right ventricular wall thickness.  3. Left atrial size was mildly dilated.  4. The mitral valve is degenerative. Mild thickening of the mitral valve leaflet. There is mild mitral annular calcification present.  5. The aortic valve is tricuspid. Mild thickening of the aortic valve. Mild calcification of the aortic valve. Aortic valve regurgitation was not assessed by color flow Doppler.    Recent Labs: 10/07/2019: ALT 24; TSH 1.010 10/23/2019: Magnesium 2.3 10/24/2019: BUN 32; Creatinine, Ser 6.82; Hemoglobin 10.3; Platelets 336; Potassium 4.2; Sodium 142  10/08/2019: Cholesterol 92; HDL 34; LDL Cholesterol 35; Total CHOL/HDL Ratio 2.7; Triglycerides 117; VLDL 23   Estimated Creatinine Clearance: 9.8 mL/min (A) (by C-G formula based on SCr of 6.82 mg/dL (H)).   Wt Readings from Last 3 Encounters:  10/24/19 202 lb 9.6 oz (91.9 kg)  09/16/19 208 lb (94.3 kg)  08/26/19 208 lb 9.6 oz (94.6 kg)     Other studies  reviewed: Additional studies/records reviewed today include: summarized above  ASSESSMENT AND PLAN:  1. 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 Hypotension since on HD      He has felt pretty fatigued since the start of HD back in May 2020, no syncope.     Hypotensive during his hospitalization requiring pressor support transitioned t9o ProAmatine 10mg  TID     ***       Time:   Today, I have spent ***  minutes with the patient with telehealth technology discussing the above problems.         Disposition: ***  Current medicines are reviewed at length with the patient today.  The patient did not have any concerns regarding medicines.     Venetia Night, PA-C 11/01/2019 9:37 AM     [address] [place] [address] [phone] (office)  [phone] (fax)

## 2019-11-02 ENCOUNTER — Telehealth: Payer: Self-pay | Admitting: Physician Assistant

## 2019-11-02 ENCOUNTER — Other Ambulatory Visit: Payer: Self-pay

## 2019-11-02 ENCOUNTER — Telehealth: Payer: Medicare Other | Admitting: Physician Assistant

## 2019-11-02 NOTE — Telephone Encounter (Signed)
Late entry The patient's daughter [name] called me Friday 10/30/2019 to cancel his appointment for today.  She reports that her father was still disoriented/confused some post CVA and he would not be able to participate in a virtual visit and she preferred his follow up to be in person.  I offerred to make today's visit an in-clinic visit though she said that she would not be able to get him and bring him in and she definitely wanted to be present.  She is not able to go into the rehab facility to aid in his virtual visit and reports after being seen in the hospital by Dr. Rayann Heman, the recommendation at discharge was to be seen in 2 weeks.   She wanted his appt to be moved to next week when she could bring him in We have tried to get this done, so far today our scheduler has not been able to reach her, though will continue to try.  Tommye Standard, PA-C

## 2019-11-12 NOTE — Progress Notes (Signed)
PCP:  McLean-Scocuzza, Nino Glow, MD Primary Cardiologist: Thompson Grayer, MD  Frank [name] is a 79 y.o. male with past medical history of 1st degree AV block, recent CVA, and ESRD on HD who presents today for routine electrophysiology followup. They are seen for Dr. Rayann Heman.  Admitted 1/27 - 10/24/19 with CVA. Discharged to SNF. There was concern during this admission due to [place]. This is chronic and stable. Dr. Rayann Heman saw and no indication for pacemaker, and overall poor candidate.   He is currently at [facility] for SNF. Getting somewhat stronger. Daughter present today. His BP seems to be more stable on Midodrine 10 mg TID since recent admission. He has a foley in place for urinary retention, and is following with urology for this. Flomax recently increased. He denies chest pain, tachy palpitations, syncope, or near syncope.   The patient feels that he is tolerating medications without difficulties and is otherwise without complaint today.   Past Medical History:  Diagnosis Date  . Anemia of chronic kidney failure   . Arthritis   . Bradycardia   . Cataracts, bilateral 05/15/2012  . Chronic idiopathic gout of multiple sites 02/10/2015  . Chronic kidney disease    [facility]  . Depression   . Diabetes mellitus without complication (HCC)    diet controlled   . Diabetic nephropathy ([place])   . ED (erectile dysfunction) 08/28/2012  . Elevated liver enzymes 06/10/2019  . Epiretinal membrane (ERM) of right eye 06/10/2019   Right eye saw AE 06/05/2019    . Essential hypertension 05/07/2013   Last Assessment & Plan:  Formatting of this note might be different from the original. HTN PLAN   BP goal is <140/90 BP Readings from Last 3 Encounters:  08/27/16 140/79  08/13/16 141/80  08/06/16 144/86   Current Anti-Hyptensive Medications: None  Today's Recommendations: Continue lifestyle modifications. Defer starting medication to PCP.  Recommend continued regular exercise as tolerated. Recomm  .  History of UTI    s/p foley in hospital 01/2019   . Hyperlipidemia   . Hypothyroidism   . Illiterate 11/19/2013   Last Assessment & Plan:  Assisted patient with Handicapped Placard application today.    . Insomnia 05/08/2019  . Neuropathy   . Peripheral neuropathy   . Peripheral vascular disease ([place])   . Secondary hyperparathyroidism ([place])    Renal  . Thyroid disease    Past Surgical History:  Procedure Laterality Date  . AV FISTULA PLACEMENT Left 11/19/2017   Procedure: ARTERIOVENOUS (AV) FISTULA CREATION;  Surgeon: [name], MD;  Location: [place];  Service: Vascular;  Laterality: Left;  . BASCILIC VEIN TRANSPOSITION Left 06/04/2018   Procedure: BASILIC VEIN TRANSPOSITION ARM;  Surgeon: [name], MD;  Location: [place];  Service: Vascular;  Laterality: Left;  . COLONOSCOPY    . DIALYSIS/PERMA CATHETER REMOVAL N/A 03/23/2019   Procedure: DIALYSIS/PERMA CATHETER REMOVAL;  Surgeon: [name], MD;  Location: [place] [facility];  Service: Cardiovascular;  Laterality: N/A;  . EYE SURGERY     cataract surgery b/l; photophobia since   . INSERTION OF DIALYSIS CATHETER N/A 01/23/2019   Procedure: INSERTION OF DIALYSIS CATHETER;  Surgeon: [name], MD;  Location: [facility];  Service: Vascular;  Laterality: N/A;  INSERTION OF DIALYSIS CATHETER  . IR ANGIO INTRA EXTRACRAN SEL COM CAROTID INNOMINATE UNI L MOD SED  10/15/2019  . IR ANGIO VERTEBRAL SEL SUBCLAVIAN INNOMINATE UNI R MOD SED  10/15/2019  . IR CT  HEAD LTD  10/15/2019  . IR FLUORO GUIDE CV LINE RIGHT  10/08/2019  . IR INTRAVSC STENT CERV CAROTID W/EMB-PROT MOD SED INCL ANGIO  10/15/2019  . IR THROMBECTOMY AV FISTULA W/THROMBOLYSIS/PTA INC/SHUNT/IMG LEFT Left 10/09/2019  . IR US GUIDE VASC ACCESS LEFT  10/09/2019  . IR US GUIDE VASC ACCESS RIGHT  10/08/2019  . KNEE ARTHROSCOPY Left    Knee  . PERIPHERAL VASCULAR THROMBECTOMY Left 07/06/2019   Procedure: PERIPHERAL VASCULAR THROMBECTOMY;  Surgeon: [name],  MD;  Location: [place] [facility];  Service: Cardiovascular;  Laterality: Left;  . RADIOLOGY WITH ANESTHESIA N/A 10/15/2019   Procedure: stent placement;  Surgeon: Luanne Bras, MD;  Location: [place];  Service: Radiology;  Laterality: N/A;  . THROMBECTOMY W/ EMBOLECTOMY Left 01/27/2019   Procedure: THROMBECTOMY Left arm ARTERIOVENOUS FISTULA  and Left arm Arteriovenous gortex graft;  Surgeon: [name], MD;  Location: [place];  Service: Vascular;  Laterality: Left;  Marland Kitchen VENOGRAM Left 01/27/2019   Procedure: Left arm FISTULOGRAM/;  Surgeon: [name], MD;  Location: [facility];  Service: Vascular;  Laterality: Left;    Current Outpatient Medications  Medication Sig Dispense Refill  . allopurinol (ZYLOPRIM) 100 MG tablet Take 1 tablet (100 mg total) by mouth daily. 90 tablet 3  . aspirin EC 81 MG tablet Take 81 mg daily by mouth.    Marland Kitchen atorvastatin (LIPITOR) 20 MG tablet Take 20 mg by mouth daily at 6 PM.     . calcitRIOL (ROCALTROL) 0.5 MCG capsule Take 1 capsule (0.5 mcg total) by mouth Every Tuesday,Thursday,and Saturday with dialysis. 30 capsule 1  . clotrimazole (LOTRIMIN) 1 % cream Apply 1 application topically daily as needed (For yeast, groin rash).    Marland Kitchen docusate sodium (COLACE) 100 MG capsule Take 200 mg by mouth daily as needed.     Marland Kitchen ketoconazole (NIZORAL) 2 % shampoo Apply 1 application topically 2 (two) times a week. (Patient taking differently: Apply 1 application topically as needed for irritation. ) 120 mL 11  . levothyroxine (SYNTHROID) 175 MCG tablet Take 1 tablet (175 mcg total) by mouth daily before breakfast. 30 tablet 0  . lidocaine-prilocaine (EMLA) cream Apply 1 application topically See admin instructions. Apply to access site 1 to 2 hours before dialysis. Cover with occlusive dressing.    . Methoxy PEG-Epoetin Beta (MIRCERA IJ) Mircera    . midodrine (PROAMATINE) 10 MG tablet Take 1 tablet (10 mg total) by mouth 3 (three) times daily with meals. 90 tablet 0  .  multivitamin (RENA-VIT) TABS tablet Take 1 tablet daily by mouth.    . Nutritional Supplements (FEEDING SUPPLEMENT, NEPRO CARB STEADY,) LIQD Take 237 mLs by mouth 3 (three) times daily with meals. 230 mL 0  . OLANZapine zydis (ZYPREXA) 5 MG disintegrating tablet Take 1 tablet (5 mg total) by mouth daily as needed (delirium). 30 tablet 0  . pantoprazole (PROTONIX) 40 MG tablet Take 1 tablet (40 mg total) by mouth daily. 30 tablet 1  . polyethylene glycol (MIRALAX / GLYCOLAX) 17 g packet Take 17 g by mouth daily. 14 each 0  . QUEtiapine (SEROQUEL) 25 MG tablet Take 0.5 tablets (12.5 mg total) by mouth at bedtime. 30 tablet 0  . sevelamer carbonate (RENVELA) 800 MG tablet Take 3 tablets (2,400 mg total) by mouth 3 (three) times daily with meals. 120 tablet 0  . tamsulosin (FLOMAX) 0.4 MG CAPS capsule Take 1 capsule (0.4 mg total) by mouth daily. 30 capsule 1  .  ticagrelor (BRILINTA) 90 MG TABS tablet Take 1 tablet (90 mg total) by mouth 2 (two) times daily. 60 tablet 3  . Zinc Oxide (BALMEX EX) Apply 1 application topically daily as needed (to irritated areas of skin).      No current facility-administered medications for this visit.    Allergies  Allergen Reactions  . Ace Inhibitors Other (See Comments)    Hyperkalemia on ACE INHIBITORS Cr>1.9  . Angiotensin Receptor Blockers Other (See Comments)    Hyperkalemia Elevates Cr > 1.9   . Ivp Dye [Iodinated Diagnostic Agents] Other (See Comments)    Cannot have due to renal failure  . Adhesive [Tape] Other (See Comments)    UNDESCRIBED REACTION Can tolerate ONLY Coban wrap or mild paper tape!!    Social History   Socioeconomic History  . Marital status: Divorced    Spouse name: Not on file  . Number of children: 4  . Years of education: Not on file  . Highest education level: 8th grade  Occupational History  . Not on file  Tobacco Use  . Smoking status: Former Smoker    Years: 30.00    Quit date: 1994    Years since quitting:  27.1  . Smokeless tobacco: Never Used  Substance and Sexual Activity  . Alcohol use: No  . Drug use: No  . Sexual activity: Not Currently  Other Topics Concern  . Not on file  Social History Narrative   4 kids (3 daughters and 1 son)   Daughter [name] DPR   Social Determinants of Health   Financial Resource Strain: Low Risk   . Difficulty of Paying Living Expenses: Not hard at all  Food Insecurity: No Food Insecurity  . Worried About Charity fundraiser in the Last Year: Never true  . Ran Out of Food in the Last Year: Never true  Transportation Needs: No Transportation Needs  . Lack of Transportation (Medical): No  . Lack of Transportation (Non-Medical): No  Physical Activity:   . Days of Exercise per Week: Not on file  . Minutes of Exercise per Session: Not on file  Stress: No Stress Concern Present  . Feeling of Stress : Only a little  Social Connections: Unknown  . Frequency of Communication with Friends and Family: More than three times a week  . Frequency of Social Gatherings with Friends and Family: Not on file  . Attends Religious Services: Not on file  . Active Member of Clubs or Organizations: Not on file  . Attends Archivist Meetings: Not on file  . Marital Status: Divorced  Human resources officer Violence: Not At Risk  . Fear of Current or Ex-Partner: No  . Emotionally Abused: No  . Physically Abused: No  . Sexually Abused: No     Review of Systems: General: No chills, fever, night sweats or weight changes  Cardiovascular:  No chest pain, dyspnea on exertion, edema, orthopnea, palpitations, paroxysmal nocturnal dyspnea Dermatological: No rash, lesions or masses Respiratory: No cough, dyspnea Urologic: No hematuria, dysuria Abdominal: No nausea, vomiting, diarrhea, bright red blood per rectum, melena, or hematemesis Neurologic: No visual changes, weakness, changes in mental status All other systems reviewed and are otherwise negative except as noted  above.  Physical Exam: Vitals:   11/13/19 1101  BP: (!) 100/52  Pulse: (!) 106  SpO2: 98%  Weight: 194 lb 1.9 oz (88.1 kg)  Height: 5\' 7"  (1.702 m)    GEN- The patient is chronically ill appearing, alert and  oriented x 3 today.   HEENT: normocephalic, atraumatic; sclera clear, conjunctiva pink; hearing intact; oropharynx clear; neck supple, no JVP Lymph- no cervical lymphadenopathy Lungs- Clear to ausculation bilaterally, normal work of breathing.  No wheezes, rales, rhonchi Heart- Regular rate and rhythm, no murmurs, rubs or gallops, PMI not laterally displaced GI- soft, non-tender, non-distended, bowel sounds present, no hepatosplenomegaly Extremities- no clubbing, cyanosis, or edema; DP/PT/radial pulses 2+ bilaterally. L arm fistula MS- no significant deformity or atrophy Skin- warm and dry, no rash or lesion Psych- euthymic mood, full affect Neuro- strength and sensation are intact  EKG is not ordered. Personal review of EKG from 10/23/2019 shows shows NSR with significant 1st degree AV block  Assessment and Plan:  1. Conduction system disease Marked 1st degree AV block with intermittent [place]enckebach No further reports of bradycardia at this time Continue to follow.   2. Hypotension during HD Continue midodrine 10 mg TID after recent admission  3. Urinary retention Foley in place.  Follow with urology.  Shirley Friar, PA-C  11/13/19 11:15 AM

## 2019-11-13 ENCOUNTER — Ambulatory Visit (INDEPENDENT_AMBULATORY_CARE_PROVIDER_SITE_OTHER): Payer: Medicare Other | Admitting: Student

## 2019-11-13 ENCOUNTER — Encounter: Payer: Self-pay | Admitting: Student

## 2019-11-13 ENCOUNTER — Other Ambulatory Visit: Payer: Self-pay

## 2019-11-13 VITALS — BP 100/52 | HR 106 | Ht 67.0 in | Wt 194.1 lb

## 2019-11-13 DIAGNOSIS — I6521 Occlusion and stenosis of right carotid artery: Secondary | ICD-10-CM | POA: Diagnosis not present

## 2019-11-13 DIAGNOSIS — I440 Atrioventricular block, first degree: Secondary | ICD-10-CM | POA: Diagnosis not present

## 2019-11-13 DIAGNOSIS — I441 Atrioventricular block, second degree: Secondary | ICD-10-CM | POA: Diagnosis not present

## 2019-11-13 DIAGNOSIS — I953 Hypotension of hemodialysis: Secondary | ICD-10-CM

## 2019-11-13 DIAGNOSIS — I959 Hypotension, unspecified: Secondary | ICD-10-CM

## 2019-11-13 DIAGNOSIS — R339 Retention of urine, unspecified: Secondary | ICD-10-CM

## 2019-11-13 NOTE — Patient Instructions (Addendum)
Medication Instructions:  none *If you need a refill on your cardiac medications before your next appointment, please call your pharmacy*   Lab Work: none If you have labs (blood work) drawn today and your tests are completely normal, you will receive your results only by: Marland Kitchen MyChart Message (if you have MyChart) OR . A paper copy in the mail If you have any lab test that is abnormal or we need to change your treatment, we will call you to review the results.   Testing/Procedures: none   Follow-Up: At [facility], you and your health needs are our priority.  As part of our continuing mission to provide you with exceptional heart care, we have created designated Provider Care Teams.  These Care Teams include your primary Cardiologist (physician) and Advanced Practice Providers (APPs -  Physician Assistants and Nurse Practitioners) who all work together to provide you with the care you need, when you need it.  We recommend signing up for the patient portal called "MyChart".  Sign up information is provided on this After Visit Summary.  MyChart is used to connect with patients for Virtual Visits (Telemedicine).  Patients are able to view lab/test results, encounter notes, upcoming appointments, etc.  Non-urgent messages can be sent to your provider as well.   To learn more about what you can do with MyChart, go to [email or web address].    Your next appointment:   6 MONTHS  The format for your next appointment:   Either In Person or Virtual  Provider:   Dr Rayann Heman   Other Instructions

## 2019-11-23 ENCOUNTER — Other Ambulatory Visit: Payer: Self-pay

## 2019-11-23 ENCOUNTER — Ambulatory Visit (HOSPITAL_COMMUNITY)
Admission: RE | Admit: 2019-11-23 | Discharge: 2019-11-23 | Disposition: A | Discharge status: Home / Self Care | Payer: Medicare Other | Source: Ambulatory Visit | Attending: Student | Admitting: Student

## 2019-11-23 ENCOUNTER — Other Ambulatory Visit (HOSPITAL_COMMUNITY): Payer: Self-pay | Admitting: Interventional Radiology

## 2019-11-23 ENCOUNTER — Telehealth (HOSPITAL_COMMUNITY): Payer: Self-pay

## 2019-11-23 DIAGNOSIS — I6521 Occlusion and stenosis of right carotid artery: Secondary | ICD-10-CM

## 2019-11-23 NOTE — Telephone Encounter (Signed)
[place] to have pt come back for redo P2Y12. The lab didn't get enough blood for lab. Left message for transportation to call back for appt. AW

## 2019-11-23 NOTE — Progress Notes (Signed)
Chief Complaint: Patient was seen in consultation today for proximal right ICA stenosis s/p revascularization.  Referring Physician(s): Rosalin Hawking  Supervising Physician: Luanne Bras  Patient Status: [facility] - Out-pt  History of Present Illness: Frank [name] is a 79 y.o. 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 He is known to [facility] and has been followed by Dr. Estanislado Pandy since 09/2019. He first presented to our department at the request of Dr. 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 He underwent an image-guided cerebral arteriogram with revascularization of proximal right ICA stenosis using stent assisted angioplasty 10/15/2019 by Dr. Estanislado Pandy. He was discharged to [facility] 10/24/2019 in stable condition.  Patient presents today for follow-up regarding his recent procedure 10/15/2019. Patient awake and alert sitting in wheelchair. Accompanied by daughter, [name]. Complains of intermittent hematuria. States foley has been "pulled on" at facility, and daughter states gross hematuria x1, otherwise pink tinged urine. States that they have seen urology for this. Complains of intermittent bruising. Daughter states that he fell and his left hip is bruised. Daughter complains of drowsiness- patient states he always wants to sleep during the day. Denies headache, weakness, numbness/tingling, dizziness, vision changes, or speech difficulty.  Currently taking Brilinta 90 mg twice daily and Aspirin 81 mg once daily.   Past Medical History:  Diagnosis Date  .  Anemia of chronic kidney failure   . Arthritis   . Bradycardia   . Cataracts, bilateral 05/15/2012  . Chronic idiopathic gout of multiple sites 02/10/2015  . Chronic kidney disease    [facility]  . Depression   . Diabetes mellitus without complication (HCC)    diet controlled   . Diabetic nephropathy ([place])   . ED (erectile dysfunction) 08/28/2012  . Elevated liver enzymes 06/10/2019  . Epiretinal membrane (ERM) of right eye 06/10/2019   Right eye saw AE 06/05/2019    . Essential hypertension 05/07/2013   Last Assessment & Plan:  Formatting of this note might be different from the original. HTN PLAN   BP goal is <140/90 BP Readings from Last 3 Encounters:  08/27/16 140/79  08/13/16 141/80  08/06/16 144/86   Current Anti-Hyptensive Medications: None  Today's Recommendations: Continue lifestyle modifications. Defer starting medication to PCP.  Recommend continued regular exercise as tolerated. Recomm  . History of UTI    s/p foley in hospital 01/2019   . Hyperlipidemia   . Hypothyroidism   . Illiterate 11/19/2013   Last Assessment & Plan:  Assisted patient with Handicapped Placard application today.    . Insomnia 05/08/2019  . Neuropathy   . Peripheral neuropathy   . Peripheral vascular disease ([place])   . Secondary hyperparathyroidism ([place])    Renal  . Thyroid disease     Past Surgical History:  Procedure Laterality Date  . AV FISTULA PLACEMENT Left 11/19/2017   Procedure: ARTERIOVENOUS (AV) FISTULA CREATION;  Surgeon: [name], MD;  Location: [place];  Service: Vascular;  Laterality: Left;  . BASCILIC VEIN TRANSPOSITION Left 06/04/2018   Procedure: BASILIC VEIN TRANSPOSITION ARM;  Surgeon: [name], MD;  Location: [place];  Service: Vascular;  Laterality: Left;  . COLONOSCOPY    . DIALYSIS/PERMA  CATHETER REMOVAL N/A 03/23/2019   Procedure: DIALYSIS/PERMA CATHETER REMOVAL;  Surgeon: [name], MD;  Location: [place] [facility];  Service: Cardiovascular;   Laterality: N/A;  . EYE SURGERY     cataract surgery b/l; photophobia since   . INSERTION OF DIALYSIS CATHETER N/A 01/23/2019   Procedure: INSERTION OF DIALYSIS CATHETER;  Surgeon: [name], MD;  Location: [facility];  Service: Vascular;  Laterality: N/A;  INSERTION OF DIALYSIS CATHETER  . IR ANGIO INTRA EXTRACRAN SEL COM CAROTID INNOMINATE UNI L MOD SED  10/15/2019  . IR ANGIO VERTEBRAL SEL SUBCLAVIAN INNOMINATE UNI R MOD SED  10/15/2019  . IR CT HEAD LTD  10/15/2019  . IR FLUORO GUIDE CV LINE RIGHT  10/08/2019  . IR INTRAVSC STENT CERV CAROTID W/EMB-PROT MOD SED INCL ANGIO  10/15/2019  . IR THROMBECTOMY AV FISTULA W/THROMBOLYSIS/PTA INC/SHUNT/IMG LEFT Left 10/09/2019  . IR US GUIDE VASC ACCESS LEFT  10/09/2019  . IR US GUIDE VASC ACCESS RIGHT  10/08/2019  . KNEE ARTHROSCOPY Left    Knee  . PERIPHERAL VASCULAR THROMBECTOMY Left 07/06/2019   Procedure: PERIPHERAL VASCULAR THROMBECTOMY;  Surgeon: [name], MD;  Location: [place] [facility];  Service: Cardiovascular;  Laterality: Left;  . RADIOLOGY WITH ANESTHESIA N/A 10/15/2019   Procedure: stent placement;  Surgeon: Luanne Bras, MD;  Location: [place];  Service: Radiology;  Laterality: N/A;  . THROMBECTOMY W/ EMBOLECTOMY Left 01/27/2019   Procedure: THROMBECTOMY Left arm ARTERIOVENOUS FISTULA  and Left arm Arteriovenous gortex graft;  Surgeon: [name], MD;  Location: [place];  Service: Vascular;  Laterality: Left;  Marland Kitchen VENOGRAM Left 01/27/2019   Procedure: Left arm FISTULOGRAM/;  Surgeon: [name], MD;  Location: [place];  Service: Vascular;  Laterality: Left;    Allergies: Ace inhibitors, Angiotensin receptor blockers, Ivp dye [iodinated diagnostic agents], and Adhesive [tape]  Medications: Prior to Admission medications   Medication Sig Start Date End Date Taking? Authorizing Provider  acetaminophen (TYLENOL) 325 MG tablet Take by mouth. 03/03/19 03/01/20  [provider]  allopurinol (ZYLOPRIM) 100 MG tablet Take  1 tablet (100 mg total) by mouth daily. 06/03/19   McLean-Scocuzza, Nino Glow, MD  aspirin EC 81 MG tablet Take 81 mg daily by mouth.    [provider]  atorvastatin (LIPITOR) 20 MG tablet Take 20 mg by mouth daily at 6 PM.     [provider]  calcitRIOL (ROCALTROL) 0.5 MCG capsule Take 1 capsule (0.5 mcg total) by mouth Every Tuesday,Thursday,and Saturday with dialysis. 10/24/19   Regalado, Belkys A, MD  clotrimazole (LOTRIMIN) 1 % cream Apply 1 application topically daily as needed (For yeast, groin rash).    [provider]  docusate sodium (COLACE) 100 MG capsule Take 200 mg by mouth daily as needed.     [provider]  levothyroxine (SYNTHROID) 175 MCG tablet Take 1 tablet (175 mcg total) by mouth daily before breakfast. 10/24/19   Regalado, Belkys A, MD  lidocaine-prilocaine (EMLA) cream Apply 1 application topically See admin instructions. Apply to access site 1 to 2 hours before dialysis. Cover with occlusive dressing. 04/08/19   [provider]  Methoxy PEG-Epoetin Beta (MIRCERA IJ) Mircera 11/10/19 11/08/20  [provider]  midodrine (PROAMATINE) 10 MG tablet Take 1 tablet (10 mg total) by mouth 3 (three) times daily with meals. 10/23/19   Regalado, Belkys A, MD  multivitamin (RENA-VIT) TABS tablet Take 1 tablet daily by mouth.    [provider]  Nutritional Supplements (FEEDING SUPPLEMENT, NEPRO CARB STEADY,) LIQD Take 237 mLs by mouth 3 (three) times daily with meals. 10/23/19   Regalado, Belkys A, MD  OLANZapine zydis (ZYPREXA) 5 MG disintegrating tablet Take 1 tablet (5 mg total) by mouth daily as needed (delirium). 10/24/19   Regalado, Belkys A, MD  pantoprazole (PROTONIX) 40 MG tablet Take 1 tablet (40 mg total) by mouth daily. 10/23/19 12/22/19  Regalado, Belkys A, MD  polyethylene glycol (MIRALAX / GLYCOLAX) 17 g packet Take 17 g by mouth daily. 10/23/19   Regalado, Belkys A, MD  QUEtiapine (SEROQUEL) 25 MG tablet Take 0.5 tablets  (12.5 mg total) by mouth at bedtime. 10/23/19   Regalado, Belkys A, MD  sevelamer carbonate (RENVELA) 800 MG tablet Take 3 tablets (2,400 mg total) by mouth 3 (three) times daily with meals. 10/23/19   Regalado, Belkys A, MD  sulfamethoxazole-trimethoprim (BACTRIM) 400-80 MG tablet Take 1 tablet by mouth daily.    [provider]  tamsulosin (FLOMAX) 0.4 MG CAPS capsule Take 1 capsule (0.4 mg total) by mouth daily. 10/23/19   Regalado, Belkys A, MD  ticagrelor (BRILINTA) 90 MG TABS tablet Take 1 tablet (90 mg total) by mouth 2 (two) times daily. 10/23/19   Regalado, Belkys A, MD  Zinc Oxide (BALMEX EX) Apply 1 application topically daily as needed (to irritated areas of skin).     [provider]     Family History  Problem Relation Age of Onset  . Diabetes Mother   . Kidney disease Mother   . Liver disease Mother   . Heart Problems Mother     Social History   Socioeconomic History  . Marital status: Divorced    Spouse name: Not on file  . Number of children: 4  . Years of education: Not on file  . Highest education level: 8th grade  Occupational History  . Not on file  Tobacco Use  . Smoking status: Former Smoker    Years: 30.00    Quit date: 1994    Years since quitting: 27.2  . Smokeless tobacco: Never Used  Substance and Sexual Activity  . Alcohol use: No  . Drug use: No  . Sexual activity: Not Currently  Other Topics Concern  . Not on file  Social History Narrative   4 kids (3 daughters and 1 son)   Daughter [name] DPR   Social Determinants of Health   Financial Resource Strain: Low Risk   . Difficulty of Paying Living Expenses: Not hard at all  Food Insecurity: No Food Insecurity  . Worried About Charity fundraiser in the Last Year: Never true  . Ran Out of Food in the Last Year: Never true  Transportation Needs: No Transportation Needs  . Lack of Transportation (Medical): No  . Lack of Transportation (Non-Medical): No  Physical Activity:   .  Days of Exercise per Week:   . Minutes of Exercise per Session:   Stress: No Stress Concern Present  . Feeling of Stress : Only a little  Social Connections: Unknown  . Frequency of Communication with Friends and Family: More than three times a week  . Frequency of Social Gatherings with Friends and Family: Not on file  . Attends Religious Services: Not on file  . Active Member of Clubs or Organizations: Not on file  . Attends Archivist Meetings: Not on file  . Marital Status: Divorced     Review of Systems: A 12 point ROS discussed and pertinent  positives are indicated in the HPI above.  All other systems are negative.  Review of Systems  Constitutional: Negative for chills and fever.  Eyes: Negative for visual disturbance.  Respiratory: Negative for shortness of breath and wheezing.   Cardiovascular: Negative for chest pain and palpitations.  Genitourinary: Positive for hematuria.  Neurological: Negative for dizziness, speech difficulty, weakness, numbness and headaches.  Hematological: Bruises/bleeds easily.    Vital Signs: There were no vitals taken for this visit.  Physical Exam Constitutional:      General: He is not in acute distress.    Appearance: Normal appearance.  Skin:    General: Skin is warm and dry.     Comments: Multiple areas of superficial ecchymosis on bilateral arms; right hip with approximate golf ball size firmness with surrounding ecchymosis, no active bleeding noted.  Neurological:     Mental Status: He is alert.     Comments: Alert and oriented to person, time, and place. Appears drowsy but able to converse today.      Imaging: No results found.  Labs:  CBC: Recent Labs    10/21/19 0255 10/21/19 0255 10/22/19 0228 10/22/19 1832 10/23/19 0213 10/24/19 0758  WBC 14.7*  --  11.9*  --  10.6* 12.5*  HGB 10.8*   < > 10.7* 11.0* 10.0* 10.3*  HCT 34.0*   < > 34.0* 34.2* 32.1* 33.0*  PLT 330  --  310  --  331 336   < > = values  in this interval not displayed.    COAGS: Recent Labs    01/27/19 0521 10/07/19 0858  INR 1.1 1.0  APTT  --  30    BMP: Recent Labs    10/21/19 1806 10/22/19 0228 10/23/19 1016 10/24/19 0758  NA 140 139 139 142  K 3.9 3.9 4.2 4.2  CL 96* 99 101 102  CO2 28 24 28 27   GLUCOSE 150* 131* 153* 139*  BUN 31* 34* 21 32*  CALCIUM 9.5 9.2 9.2 9.5  CREATININE 6.08* 6.77* 5.45* 6.82*  GFRNONAA 8* 7* 9* 7*  GFRAA 9* 8* 11* 8*    LIVER FUNCTION TESTS: Recent Labs    01/22/19 0541 01/23/19 0349 01/28/19 0437 05/11/19 1037 06/10/19 1208 06/10/19 1208 10/07/19 0858 10/08/19 0316 10/12/19 0701 10/13/19 0515 10/14/19 0746 10/17/19 1122  BILITOT 0.5  --   --  0.3 0.4  --  0.4  --   --   --   --   --   AST 48*  --   --  63* 47*  --  40  --   --   --   --   --   ALT 27  --   --  88* 67*  --  24  --   --   --   --   --   ALKPHOS 68  --   --  168* 169*  --  99  --   --   --   --   --   PROT 6.4*  --   --  7.2 8.3  --  7.3  --   --   --   --   --   ALBUMIN 3.0*   < >   < > 4.0 4.3   < > 3.5   < > 2.6* 2.8* 2.8* 3.0*   < > = values in this interval not displayed.     Assessment and Plan:  Proximal right ICA stenosis s/p revascularization using stent assisted  angioplasty 10/15/2019 by Dr. Estanislado Pandy. Dr. Estanislado Pandy was present for consultation.  Discussed patient's post-stroke progress. Patient's daughter states that patient currently resides in a SNF, and needs assistance with bathing, dressing, and ambulating (can ambulated with assistance). Daughter and patient have questions regarding drowsiness. Reviewed patient medications- appears patient is on Trazodone BID in addition to a HS dose. Recommend daughter reach out to SNF regarding possible decrease in Trazodone dose, as this can be contributing to his drowsiness.  Discussed easy bruising. Will obtain P2Y12 today to check effectiveness of DAPT. Informed daughter that I will call her if this result warrants medication changes,  otherwise instructed patient to continue taking Brilinta 90 mg twice daily and Aspirin 81 mg once daily.  Discussed secondary stroke prevention. Instructed patient to follow-up with PCP regarding diabetes management.  Discussed proximal right ICA stent. Explained that best course of management for his stent at this time is with routine imaging scans to monitor for changes. Plan for follow-up with carotid US 6 months from recent procedure 10/15/2019. Informed patient that our schedulers will call him/his daughter to set up this imaging scan.  All questions answered and concerns addressed. Patient and daughter convey understanding and agree with plan.  Thank you for this interesting consult.  I greatly enjoyed meeting KNOX CERVI and look forward to participating in their care.  A copy of this report was sent to the requesting provider on this date.  Electronically Signed: Earley Abide, PA-C 11/23/2019, 10:44 AM   I spent a total of 40 Minutes in face to face in clinical consultation, greater than 50% of which was counseling/coordinating care for proximal right ICA stenosis s/p revascularization.

## 2019-11-26 ENCOUNTER — Other Ambulatory Visit: Payer: Self-pay | Admitting: Physician Assistant

## 2019-11-26 DIAGNOSIS — I63131 Cerebral infarction due to embolism of right carotid artery: Secondary | ICD-10-CM

## 2019-11-27 ENCOUNTER — Other Ambulatory Visit: Payer: Self-pay | Admitting: Student

## 2019-11-27 LAB — PLATELET INHIBITION P2Y12: Platelet Function  P2Y12: 194 [PRU] (ref 182–335)

## 2019-12-02 DIAGNOSIS — J309 Allergic rhinitis, unspecified: Secondary | ICD-10-CM | POA: Insufficient documentation

## 2019-12-02 DIAGNOSIS — E8770 Fluid overload, unspecified: Secondary | ICD-10-CM | POA: Insufficient documentation

## 2019-12-10 ENCOUNTER — Inpatient Hospital Stay: Payer: Medicare Other | Admitting: Neurology

## 2019-12-14 ENCOUNTER — Emergency Department (HOSPITAL_BASED_OUTPATIENT_CLINIC_OR_DEPARTMENT_OTHER): Payer: Medicare Other

## 2019-12-14 ENCOUNTER — Emergency Department (HOSPITAL_COMMUNITY)
Admission: EM | Admit: 2019-12-14 | Discharge: 2019-12-14 | Disposition: A | Discharge status: Home / Self Care | Payer: Medicare Other | Attending: Emergency Medicine | Admitting: Emergency Medicine

## 2019-12-14 ENCOUNTER — Other Ambulatory Visit: Payer: Self-pay

## 2019-12-14 DIAGNOSIS — I120 Hypertensive chronic kidney disease with stage 5 chronic kidney disease or end stage renal disease: Secondary | ICD-10-CM | POA: Insufficient documentation

## 2019-12-14 DIAGNOSIS — E1122 Type 2 diabetes mellitus with diabetic chronic kidney disease: Secondary | ICD-10-CM | POA: Diagnosis not present

## 2019-12-14 DIAGNOSIS — N186 End stage renal disease: Secondary | ICD-10-CM | POA: Diagnosis not present

## 2019-12-14 DIAGNOSIS — Y69 Unspecified misadventure during surgical and medical care: Secondary | ICD-10-CM | POA: Insufficient documentation

## 2019-12-14 DIAGNOSIS — Z992 Dependence on renal dialysis: Secondary | ICD-10-CM | POA: Insufficient documentation

## 2019-12-14 DIAGNOSIS — T8249XA Other complication of vascular dialysis catheter, initial encounter: Secondary | ICD-10-CM | POA: Diagnosis present

## 2019-12-14 DIAGNOSIS — T82868A Thrombosis of vascular prosthetic devices, implants and grafts, initial encounter: Secondary | ICD-10-CM

## 2019-12-14 DIAGNOSIS — Z79899 Other long term (current) drug therapy: Secondary | ICD-10-CM | POA: Insufficient documentation

## 2019-12-14 DIAGNOSIS — Z7984 Long term (current) use of oral hypoglycemic drugs: Secondary | ICD-10-CM | POA: Diagnosis not present

## 2019-12-14 LAB — CBC WITH DIFFERENTIAL/PLATELET
Abs Immature Granulocytes: 0.03 10*3/uL (ref 0.00–0.07)
Basophils Absolute: 0 10*3/uL (ref 0.0–0.1)
Basophils Relative: 1 %
Eosinophils Absolute: 0.3 10*3/uL (ref 0.0–0.5)
Eosinophils Relative: 4 %
HCT: 30 % — ABNORMAL LOW (ref 39.0–52.0)
Hemoglobin: 9.2 g/dL — ABNORMAL LOW (ref 13.0–17.0)
Immature Granulocytes: 0 %
Lymphocytes Relative: 17 %
Lymphs Abs: 1.2 10*3/uL (ref 0.7–4.0)
MCH: 31.1 pg (ref 26.0–34.0)
MCHC: 30.7 g/dL (ref 30.0–36.0)
MCV: 101.4 fL — ABNORMAL HIGH (ref 80.0–100.0)
Monocytes Absolute: 0.6 10*3/uL (ref 0.1–1.0)
Monocytes Relative: 9 %
Neutro Abs: 5.1 10*3/uL (ref 1.7–7.7)
Neutrophils Relative %: 69 %
Platelets: 171 10*3/uL (ref 150–400)
RBC: 2.96 MIL/uL — ABNORMAL LOW (ref 4.22–5.81)
RDW: 15.6 % — ABNORMAL HIGH (ref 11.5–15.5)
WBC: 7.3 10*3/uL (ref 4.0–10.5)
nRBC: 0 % (ref 0.0–0.2)

## 2019-12-14 LAB — BASIC METABOLIC PANEL
Anion gap: 13 (ref 5–15)
BUN: 78 mg/dL — ABNORMAL HIGH (ref 8–23)
CO2: 27 mmol/L (ref 22–32)
Calcium: 8.9 mg/dL (ref 8.9–10.3)
Chloride: 97 mmol/L — ABNORMAL LOW (ref 98–111)
Creatinine, Ser: 8.19 mg/dL — ABNORMAL HIGH (ref 0.61–1.24)
GFR calc Af Amer: 7 mL/min — ABNORMAL LOW (ref >60)
GFR calc non Af Amer: 6 mL/min — ABNORMAL LOW (ref >60)
Glucose, Bld: 182 mg/dL — ABNORMAL HIGH (ref 70–99)
Potassium: 4.8 mmol/L (ref 3.5–5.1)
Sodium: 137 mmol/L (ref 135–145)

## 2019-12-14 LAB — MAGNESIUM: Magnesium: 2.3 mg/dL (ref 1.7–2.4)

## 2019-12-14 LAB — PROTIME-INR
INR: 1 (ref 0.8–1.2)
Prothrombin Time: 13.2 seconds (ref 11.4–15.2)

## 2019-12-14 MED ORDER — MIDODRINE HCL 10 MG PO TABS: 10.0000 mg | ORAL_TABLET | Freq: Three times a day (TID) | ORAL | 3 refills | Status: AC

## 2019-12-14 NOTE — ED Provider Notes (Signed)
[facility] EMERGENCY DEPARTMENT Provider Note   CSN: [id number] Arrival date & time: 12/14/19  1522     History CC: Clotted AV fistula left arm  Frank [name] is a 79 y.o. 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  He reports on Saturday at dialysis they were unable to access his fistula site.  There is concerned it may be clotted again.  He is at a SNF near [place] and gets all of his current care at [facility].   He did get dialysis on Tues, Thurs and only missed 1 session on Saturday.  He denies weakness, SOB, chest pain, or left arm pain.    He sustained a laceration to his left lateral arm while being transported to the ED today, says his arm scraped on something.   Currently on brilinta 90 mg BID and aspirin 81 mg daily.  Update provided by his daughter by phone, Adelfa Koh, who tells me that the patient's nephrologist is Dr. Posey Pronto.  He was unable to get his dialysis on Saturday because the fistula site likely clotted.  They were unable to arrange for him to get vascular lab access on Saturday, and discussed it with the vascular surgeon on call at [facility], who had recommended that he come to [facility] Monday for evaluation.  She believes [facility] may have sent him to [facility] instead because "they said it was closer."  HPI     Past Medical History:  Diagnosis Date  . Anemia of chronic kidney failure   . Arthritis   . Bradycardia   . Cataracts, bilateral 05/15/2012  . Chronic idiopathic gout of multiple sites 02/10/2015  . Chronic kidney disease    [facility]  . Depression   . Diabetes mellitus without complication (HCC)    diet controlled   . Diabetic nephropathy ([place])   .  ED (erectile dysfunction) 08/28/2012  . Elevated liver enzymes 06/10/2019  . Epiretinal membrane (ERM) of right eye 06/10/2019   Right eye saw AE 06/05/2019    . Essential hypertension 05/07/2013   Last Assessment & Plan:  Formatting of this note might be different from the original. HTN PLAN   BP goal is <140/90 BP Readings from Last 3 Encounters:  08/27/16 140/79  08/13/16 141/80  08/06/16 144/86   Current Anti-Hyptensive Medications: None  Today's Recommendations: Continue lifestyle modifications. Defer starting medication to PCP.  Recommend continued regular exercise as tolerated. Recomm  . History of UTI    s/p foley in hospital 01/2019   . Hyperlipidemia   . Hypothyroidism   . Illiterate 11/19/2013   Last Assessment & Plan:  Assisted patient with Handicapped Placard application today.    . Insomnia 05/08/2019  . Neuropathy   . Peripheral neuropathy   . Peripheral vascular disease ([place])   . Secondary hyperparathyroidism ([place])    Renal  . Thyroid disease     Patient Active Problem List   Diagnosis Date Noted  . Atrioventricular block, Mobitz type 1, Wenckebach   . Left-sided weakness   . Cerebrovascular accident (CVA) due to embolism of right carotid artery ([place])   . Stenosis of right carotid artery   . Arterial hypotension   . Cerebral embolism with cerebral infarction 10/08/2019  .  Slurred speech 10/07/2019  . Epiretinal membrane (ERM) of right eye 06/10/2019  . Elevated liver enzymes 06/10/2019  . Benign prostatic hyperplasia 05/08/2019  . Insomnia 05/08/2019  . Coronary artery disease involving native coronary artery of native heart without angina pectoris 05/08/2019  . Atherosclerosis 05/08/2019  . Hyperphosphatemia 05/08/2019  . Secondary hyperparathyroidism ([place])   . Hyperlipidemia   . Gout   . Anemia of chronic kidney failure   . Near syncope 01/21/2019  . Elevated troponin 01/21/2019  . Osteoarthritis of midfoot, right 08/13/2016  . Foot swelling 07/04/2016  . DM  type 2 with diabetic peripheral neuropathy ([place]) 11/28/2015  . Pre-transplant evaluation for kidney transplant 11/28/2015  . HLD (hyperlipidemia) 08/01/2015  . 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 Chronic idiopathic gout of multiple sites 02/10/2015  . Illiterate 11/19/2013  . ESRD (end stage renal disease) ([place]) 05/07/2013  . Essential hypertension 05/07/2013  . Osteoarthritis 05/07/2013  . Diastasis recti 10/29/2012  . 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 ED (erectile dysfunction) 08/28/2012  . Cataracts, bilateral 05/15/2012  . Chronic pain 05/15/2012  . DM (diabetes mellitus) ([place]) 05/15/2012  . Gout of knee 05/15/2012  . Hyperkalemia 05/15/2012  . Hypothyroidism 05/15/2012    Past Surgical History:  Procedure Laterality Date  . AV FISTULA PLACEMENT Left 11/19/2017   Procedure: ARTERIOVENOUS (AV) FISTULA CREATION;  Surgeon: [name], MD;  Location: [facility];  Service: Vascular;  Laterality: Left;  . BASCILIC VEIN TRANSPOSITION Left 06/04/2018   Procedure: BASILIC VEIN TRANSPOSITION ARM;  Surgeon: [name], MD;  Location: [facility];  Service: Vascular;  Laterality: Left;  . COLONOSCOPY    . DIALYSIS/PERMA CATHETER REMOVAL N/A 03/23/2019   Procedure: DIALYSIS/PERMA CATHETER REMOVAL;  Surgeon: [name], MD;  Location: [facility] CV LAB;  Service: Cardiovascular;  Laterality: N/A;  . EYE SURGERY     cataract surgery b/l; photophobia since   . INSERTION OF DIALYSIS CATHETER N/A 01/23/2019   Procedure: INSERTION OF DIALYSIS CATHETER;  Surgeon: [name], MD;  Location: [facility] OR;  Service: Vascular;  Laterality: N/A;  INSERTION OF DIALYSIS CATHETER  . IR ANGIO INTRA EXTRACRAN SEL COM CAROTID INNOMINATE UNI L MOD SED  10/15/2019  . IR ANGIO VERTEBRAL SEL SUBCLAVIAN INNOMINATE UNI R MOD SED  10/15/2019  . IR CT HEAD LTD  10/15/2019  . IR FLUORO GUIDE CV  LINE RIGHT  10/08/2019  . IR INTRAVSC STENT CERV CAROTID W/EMB-PROT MOD SED INCL ANGIO  10/15/2019  . IR THROMBECTOMY AV FISTULA W/THROMBOLYSIS/PTA INC/SHUNT/IMG LEFT Left 10/09/2019  . IR US GUIDE VASC ACCESS LEFT  10/09/2019  . IR US GUIDE VASC ACCESS RIGHT  10/08/2019  . KNEE ARTHROSCOPY Left    Knee  . PERIPHERAL VASCULAR THROMBECTOMY Left 07/06/2019   Procedure: PERIPHERAL VASCULAR THROMBECTOMY;  Surgeon: [name], MD;  Location: [facility] CV LAB;  Service: Cardiovascular;  Laterality: Left;  . RADIOLOGY WITH ANESTHESIA N/A 10/15/2019   Procedure: stent placement;  Surgeon: Luanne Bras, MD;  Location: [facility];  Service: Radiology;  Laterality: N/A;  . THROMBECTOMY W/ EMBOLECTOMY Left 01/27/2019   Procedure: THROMBECTOMY Left arm ARTERIOVENOUS FISTULA  and Left arm Arteriovenous gortex graft;  Surgeon: [name], MD;  Location: [facility];  Service: Vascular;  Laterality: Left;  Marland Kitchen VENOGRAM Left 01/27/2019   Procedure: Left arm FISTULOGRAM/;  Surgeon: [name], MD;  Location: [facility];  Service: Vascular;  Laterality: Left;  Family History  Problem Relation Age of Onset  . Diabetes Mother   . Kidney disease Mother   . Liver disease Mother   . Heart Problems Mother     Social History   Tobacco Use  . Smoking status: Former Smoker    Years: 30.00    Quit date: 1994    Years since quitting: 27.2  . Smokeless tobacco: Never Used  Substance Use Topics  . Alcohol use: No  . Drug use: No    Home Medications Prior to Admission medications   Medication Sig Start Date End Date Taking? Authorizing Provider  acetaminophen (TYLENOL) 325 MG tablet Take 650 mg by mouth every 4 (four) hours as needed (for chronic pain).  03/03/19 03/01/20 Yes [provider]  allopurinol (ZYLOPRIM) 100 MG tablet Take 1 tablet (100 mg total) by mouth daily. 06/03/19  Yes McLean-Scocuzza, Nino Glow, MD  Amino Acids-Protein Hydrolys (FEEDING SUPPLEMENT, PRO-STAT SUGAR FREE 64,) LIQD  Take 30 mLs by mouth daily.   Yes [provider]  aspirin EC 81 MG tablet Take 81 mg daily by mouth.   Yes [provider]  atorvastatin (LIPITOR) 20 MG tablet Take 20 mg by mouth daily at 6 PM.    Yes [provider]  calcitRIOL (ROCALTROL) 0.5 MCG capsule Take 1 capsule (0.5 mcg total) by mouth Every Tuesday,Thursday,and Saturday with dialysis. 10/24/19  Yes Regalado, Belkys A, MD  clotrimazole (LOTRIMIN) 1 % cream Apply 1 application topically daily as needed (For yeast, groin rash).   Yes [provider]  docusate sodium (COLACE) 100 MG capsule Take 200 mg by mouth daily as needed.    Yes [provider]  ketoconazole (NIZORAL) 2 % shampoo Apply 1 application topically once. On Monday and Thursday 10/24/19  Yes [provider]  levothyroxine (SYNTHROID) 175 MCG tablet Take 1 tablet (175 mcg total) by mouth daily before breakfast. 10/24/19  Yes Regalado, Belkys A, MD  lidocaine-prilocaine (EMLA) cream Apply 1 application topically See admin instructions. Apply to access site 1 to 2 hours before dialysis. Cover with occlusive dressing.On Tuesday,Thursday and Saturday 04/08/19  Yes [provider]  midodrine (PROAMATINE) 10 MG tablet Take 1 tablet (10 mg total) by mouth 3 (three) times daily with meals. Patient taking differently: Take 10 mg by mouth See admin instructions. With meals. Administer 1/2 hour before dialysis on Tuesday, Thursday, Saturday. 12/14/19  Yes Shirley Friar, PA-C  Multiple Vitamin (MULTIVITAMIN WITH MINERALS) TABS tablet Take 1 tablet by mouth daily.   Yes [provider]  Nutritional Supplements (FEEDING SUPPLEMENT, NEPRO CARB STEADY,) LIQD Take 237 mLs by mouth 3 (three) times daily with meals. 10/23/19  Yes Regalado, Belkys A, MD  pantoprazole (PROTONIX) 40 MG tablet Take 1 tablet (40 mg total) by mouth daily. 10/23/19 12/22/19 Yes Regalado, Belkys A, MD  polyethylene glycol (MIRALAX / GLYCOLAX) 17 g  packet Take 17 g by mouth daily. 10/23/19  Yes Regalado, Belkys A, MD  sevelamer carbonate (RENVELA) 800 MG tablet Take 3 tablets (2,400 mg total) by mouth 3 (three) times daily with meals. 10/23/19  Yes Regalado, Belkys A, MD  tamsulosin (FLOMAX) 0.4 MG CAPS capsule Take 1 capsule (0.4 mg total) by mouth daily. 10/23/19  Yes Regalado, Belkys A, MD  ticagrelor (BRILINTA) 90 MG TABS tablet Take 1 tablet (90 mg total) by mouth 2 (two) times daily. 10/23/19  Yes Regalado, Belkys A, MD  traZODone (DESYREL) 50 MG tablet Take 25 mg by mouth at bedtime.  Yes [provider]  Zinc Oxide (BALMEX EX) Apply 1 application topically daily as needed (to irritated areas of skin).    Yes [provider]  OLANZapine zydis (ZYPREXA) 5 MG disintegrating tablet Take 1 tablet (5 mg total) by mouth daily as needed (delirium). Patient not taking: Reported on 12/14/2019 10/24/19   Regalado, Jerald Kief A, MD  QUEtiapine (SEROQUEL) 25 MG tablet Take 0.5 tablets (12.5 mg total) by mouth at bedtime. Patient not taking: Reported on 12/14/2019 10/23/19   Regalado, Jerald Kief A, MD  sulfamethoxazole-trimethoprim (BACTRIM) 400-80 MG tablet Take 1 tablet by mouth daily.    [provider]    Allergies    Ace inhibitors, Angiotensin receptor blockers, Ivp dye [iodinated diagnostic agents], and Adhesive [tape]  Review of Systems   Review of Systems  Constitutional: Negative for chills and fever.  HENT: Negative for ear pain and sore throat.   Eyes: Negative for pain and visual disturbance.  Respiratory: Negative for cough and shortness of breath.   Cardiovascular: Negative for chest pain and palpitations.  Gastrointestinal: Negative for abdominal pain and vomiting.  Genitourinary: Negative for dysuria and hematuria.  Musculoskeletal: Negative for arthralgias, back pain and myalgias.  Skin: Negative for color change and rash.  Neurological: Negative for syncope and headaches.  All other systems reviewed and are  negative.   Physical Exam Updated Vital Signs BP (!) 154/74   Pulse 76   Temp 98.2 F (36.8 C) (Oral)   Resp 16   Ht 5\' 8"  (1.727 m)   Wt 94.3 kg   SpO2 96%   BMI 31.63 kg/m   Physical Exam Vitals and nursing note reviewed.  Constitutional:      Appearance: He is well-developed.  HENT:     Head: Normocephalic and atraumatic.  Eyes:     Conjunctiva/sclera: Conjunctivae normal.  Cardiovascular:     Rate and Rhythm: Normal rate and regular rhythm.     Pulses: Normal pulses.  Pulmonary:     Effort: Pulmonary effort is normal. No respiratory distress.     Breath sounds: Normal breath sounds.  Abdominal:     Palpations: Abdomen is soft.     Tenderness: There is no abdominal tenderness.  Musculoskeletal:     Cervical back: Neck supple.     Comments: Fistula site in left arm with no palpable thrill, no audible bruit  Skin:    General: Skin is warm and dry.  Neurological:     General: No focal deficit present.     Mental Status: He is alert and oriented to person, place, and time.  Psychiatric:        Mood and Affect: Mood normal.        Behavior: Behavior normal.     ED Results / Procedures / Treatments   Labs (all labs ordered are listed, but only abnormal results are displayed) Labs Reviewed  BASIC METABOLIC PANEL - Abnormal; Notable for the following components:      Result Value   Chloride 97 (*)    Glucose, Bld 182 (*)    BUN 78 (*)    Creatinine, Ser 8.19 (*)    GFR calc non Af Amer 6 (*)    GFR calc Af Amer 7 (*)    All other components within normal limits  CBC WITH DIFFERENTIAL/PLATELET - Abnormal; Notable for the following components:   RBC 2.96 (*)    Hemoglobin 9.2 (*)    HCT 30.0 (*)    MCV 101.4 (*)  RDW 15.6 (*)    All other components within normal limits  MAGNESIUM  PROTIME-INR    EKG None  Radiology VAS US DUPLEX DIALYSIS ACCESS (AVF, AVG)  Result Date: 12/14/2019 DIALYSIS ACCESS Reason for Exam: Unable to dialyze through  AVF/AVG. Access Type: Brachial artery to axillary vein. History: Left AV graft thrombolysis 10/09/19, Mechanical thrombectomy to the left          brachial artery to axillary vein AV graft with the penumbra CAT D          device, 07/06/19 Performing Technologist: Sharion Dove RVS  Examination Guidelines: A complete evaluation includes B-mode imaging, spectral Doppler, color Doppler, and power Doppler as needed of all accessible portions of each vessel. Unilateral testing is considered an integral part of a complete examination. Limited examinations for reoccurring indications may be performed as noted.  Findings:   Thrombosed  Summary: Arteriovenous graft-Thrombus noted in the Left upper extremity.    --------------------------------------------------------------------------------   Preliminary     Procedures Procedures (including critical care time)  Medications Ordered in ED Medications - No data to display  ED Course  I have reviewed the triage vital signs and the nursing notes.  Pertinent labs & imaging results that were available during my care of the patient were reviewed by me and considered in my medical decision making (see chart for details).  This patient complains of thrombosed fistula and missed dialysis .  This involves an extensive number of treatment options, and is a complaint that carries with it a high risk of complications and morbidity. Missed dialysis can lead to complications including volume overload, respiratory failure, arrhythmias, electrolyte derangements, coma, and death.  I ordered, reviewed, and interpreted labs, which included vital signs, blood tests I ordered imaging studies which included vascular study of fistula graft site Additional history was obtained from patient's daughter as noted in history above Previous records obtained and reviewed showing recent hospitalization course (discharge summary), Dr Oneida Alar operative note I consulted Dr Posey Pronto of nephrology  and discussed lab and imaging findings  After the interventions stated above, I reevaluated the patient and found that he remained asymptomatic, with stable vital signs, no evidence of volume overload.  His blood work did not show a need for emergent dialysis.  I discussed the case with Dr. Posey Pronto and felt that it was reasonable to discharge the patient, if he would be able to get vascular access addressed in the next day or two, which Dr. Posey Pronto felt he could.  This was also discussed with the patient and the patient's daughter who agreed with the plan.   Clinical Course as of Dec 13 2312  Mon Dec 14, 2019  1807 Ultrasound technician reporting extensive clotting in fistula   [MT]  1850 Paged vascular surgery regarding thrombosis   [MT]  1952 Discussed with daughter, reported plan.  Dr patel to help coordinate outpatient vascular access appointment in next 1-2 days via the patient's dialysis center.   [MT]    Clinical Course User Index [MT] Ladonne Sharples, Carola Rhine, MD    Final Clinical Impression(s) / ED Diagnoses Final diagnoses:  Thrombosis of arteriovenous dialysis fistula, initial encounter [facility])    Rx / DC Orders ED Discharge Orders    None       Turner Kunzman, Carola Rhine, MD 12/14/19 2314

## 2019-12-14 NOTE — ED Triage Notes (Signed)
Pt goes to dialysis on T/Th/Sat.  On Saturday fistula was clogged, bleeding noted at the center in [place].  Pt was supposed to see them today and have fistula checked and nurse called him.  Pt here thinking he was in [place] to get his fistula checked.  Scheduled for dialysis tomorrow.  No bleeding noted.  No other sx noted.  In the process of transfer pt received a skin tear to outer L forearm.

## 2019-12-14 NOTE — Progress Notes (Signed)
VASCULAR LAB PRELIMINARY  PRELIMINARY  PRELIMINARY  PRELIMINARY  Left upper extremity evaluation of Dialysis access completed.    Preliminary report:  See CV proc for preliminary results.  Gave report to Dr. Caryl Ada, [facility], RVT 12/14/2019, 6:39 PM

## 2019-12-14 NOTE — ED Notes (Signed)
Waiting for [facility] for transport back to [facility].

## 2019-12-14 NOTE — ED Notes (Signed)
Patient verbalizes understanding of discharge instructions. Opportunity for questioning and answers were provided. Armband removed by staff, pt discharged from ED via [place] back to [facility].

## 2019-12-14 NOTE — ED Notes (Signed)
[facility] called no response.

## 2019-12-14 NOTE — Discharge Instructions (Addendum)
Discharge Summary  Frank [name] was diagnosed with a thrombosed left arm fistula in the ER today.  This was confirmed on vascular ultrasound.  His bloodwork did NOT show a need for emergent dialysis.  His labs are included.  I spoke to Dr Posey Pronto his nephrologist, who told me he would contact the patient's dialysis center tomorrow morning to arrange for rapid outpatient follow up in the vascular lab.  This is managed on an outpatient basis.  He said the dialysis center will call Frank [name] (or [name]) in the morning to coordinate things.  If your staff does not hear from the center by 8 am, please call the dialysis center.  Please keep Frank [name] NPO at midnight (medications are okay) until you confirm whether he will be having his procedure tomorrow.  It may happen tomorrow or the next day depending on availabilities.  Please note, he got a skin tear to his left arm during transport to the hospital.  Keep this bandaged overnight and for the next 2-3 days until it heals.

## 2019-12-18 ENCOUNTER — Telehealth: Payer: Self-pay | Admitting: Internal Medicine

## 2019-12-18 NOTE — Telephone Encounter (Signed)
Okay to send 

## 2019-12-18 NOTE — Telephone Encounter (Signed)
Frank [name] with [facility] called and states that they are starting nursing services today -along with PT, OT, and speech therapy. She is requesting a glucometer and strips. Pt had one at assisted living but does not have one where he currently resides. Please write order for that.

## 2019-12-21 ENCOUNTER — Other Ambulatory Visit: Payer: Self-pay | Admitting: Internal Medicine

## 2019-12-21 DIAGNOSIS — E119 Type 2 diabetes mellitus without complications: Secondary | ICD-10-CM

## 2019-12-21 MED ORDER — BLOOD GLUCOSE METER KIT: PACK | 0 refills | Status: DC

## 2019-12-21 NOTE — Telephone Encounter (Signed)
[name] informed

## 2019-12-21 NOTE — Telephone Encounter (Signed)
Rx for glucose meter on printer please fax [facility]   Thanks tMS

## 2019-12-21 NOTE — Telephone Encounter (Signed)
Faxed

## 2019-12-22 NOTE — Progress Notes (Signed)
[facility]    Provider:  Dr Jaynee Eagles Requesting Provider: McLean-Scocuzza, Olivia Mackie * Primary Care Provider:  McLean-Scocuzza, Nino Glow, MD  CC:  Stroke, follow up from hospital  HPI:  Frank [name] is a 79 y.o. male here as requested by McLean-Scocuzza, Olivia Mackie * for hospital follow up. PMHx thyroid disease, secondary hyperparathyroidism, end-stage renal disease on dialysis, peripheral vascular disease, peripheral neuropathy, insomnia, illiterate, hyperlipidemia, hypertension, diabetic nephropathy, diabetes mellitus, depression, chronic kidney disease, gout, bradycardia, anemia of chronic kidney disease, coronary artery disease, cerebral embolism with cerebral infarction and stenosis of the right carotid artery.  I reviewed notes from the hospital, patient presented on January 27 of this year for facial droop and altered mental status, family found him slumped over and having difficulty walking, legs weak for about a week, code stroke was initiated however he was outside the window for IV TPA.  His CTA head and neck showed a 75% stenosis of the right ICA.  MRI of the brain (personally reviewed images) showed multiple bilateral scattered infarcts in the watershed territories secondary to hypoperfusion in the setting of chronic hypotension and right ICA high-grade stenosis, status post stenting of the right ICA, he was started on aspirin and Plavix.  Chronic hypotension, on midodrine, he was already on Lipitor and his LDL was 35, his hemoglobin A1c was 6.7 which was within goal.  Here with his daughter who also provides much information. He feels he is improved. He has always had some stuttering that worsened after his stroke but now improved back to baseline. He is using a walker. He has dialysis 3x weekly. He has a walker and walks well. He feels weaker after dialysis. He is living in assisted living. Daughter states he did very well in [facility], He wants to drive and we discussed I  do not recommend that, he has memory changes likely dementia, daughter says his memory is worsening, he perseverates on subjects, now getting PT at assisted living and OT and speech too. Asiisted living managing medications. He had at least one fall at [facility] but now PT is working with him. [facility] is at [facility] and we discussed given his cognitive status, his physical status, multiple complicated comorbidities and stroke, I do recommend that he stays in assisted living and I discussed there is no driving.  Patient was disappointed he still wants to drive, I did explain that it was not safe for him or for other people on the road to have him driving.  But I did try to encourage him that transportation is available to him.  He is adjusting to his new living environment.  He is starting to see a new primary care doctor at [facility], she saw him today and daughter said they liked her very much and they discussed changing medications for anxiety.  We also discussed today his blood pressure is low systolic around 161, given his watershed infarcts and his atherosclerosis I do recommend that his blood pressure stay above 120 and he continues the midodrine 3 times a day.  For his anxiety they can modify his trazodone, we could try a very very low-dose as needed benzodiazepine but I will defer to his new doctor at [facility] assisted living for this.  We discussed fall risks.  We discussed managing his vascular risk factors, and I am very happy that he has so much support between his daughter and his assisted living facility.  His daughter appears to be quite  caring and knowledgeable.  He is eating well without swallowing difficulties.  No pain.  Reviewed notes, labs and imaging from outside physicians, which showed:  MRI of the brain (personally reviewed imaging) and agree with the following: 1. Multifocal acute ischemic nonhemorrhagic small vessel type infarcts involving the  periventricular/periatrial white matter of both cerebral hemispheres as above. No associated hemorrhage or mass effect. 2. Underlying moderately advanced age-related cerebral atrophy with mild chronic small vessel ischemic disease  Review of Systems: Patient complains of symptoms per HPI as well as the following symptoms: Memory loss, anxiety, cognitive decline, weakness, stuttering. Pertinent negatives and positives per HPI. All others negative.   Social History   Socioeconomic History  . Marital status: Divorced    Spouse name: Not on file  . Number of children: 4  . Years of education: Not on file  . Highest education level: 8th grade  Occupational History  . Not on file  Tobacco Use  . Smoking status: Former Smoker    Years: 30.00    Quit date: 1994    Years since quitting: 27.3  . Smokeless tobacco: Never Used  Substance and Sexual Activity  . Alcohol use: No  . Drug use: No  . Sexual activity: Not Currently  Other Topics Concern  . Not on file  Social History Narrative   4 kids (3 daughters and 1 son)   Daughter [name] DPR      Lives at [facility]    Social Determinants of Health   Financial Resource Strain: Low Risk   . Difficulty of Paying Living Expenses: Not hard at all  Food Insecurity: No Food Insecurity  . Worried About Charity fundraiser in the Last Year: Never true  . Ran Out of Food in the Last Year: Never true  Transportation Needs: No Transportation Needs  . Lack of Transportation (Medical): No  . Lack of Transportation (Non-Medical): No  Physical Activity:   . Days of Exercise per Week:   . Minutes of Exercise per Session:   Stress: No Stress Concern Present  . Feeling of Stress : Only a little  Social Connections: Unknown  . Frequency of Communication with Friends and Family: More than three times a week  . Frequency of Social Gatherings with Friends and Family: Not on file  . Attends Religious Services: Not on file  .  Active Member of Clubs or Organizations: Not on file  . Attends Archivist Meetings: Not on file  . Marital Status: Divorced  Human resources officer Violence: Not At Risk  . Fear of Current or Ex-Partner: No  . Emotionally Abused: No  . Physically Abused: No  . Sexually Abused: No    Family History  Problem Relation Age of Onset  . Diabetes Mother   . Kidney disease Mother   . Liver disease Mother   . Heart Problems Mother   . Stroke Neg Hx     Past Medical History:  Diagnosis Date  . Anemia of chronic kidney failure   . Arthritis   . Bradycardia   . Cataracts, bilateral 05/15/2012  . Chronic idiopathic gout of multiple sites 02/10/2015  . Chronic kidney disease    [facility]  . Depression   . Diabetes mellitus without complication (HCC)    diet controlled   . Diabetic nephropathy ([place])   . ED (erectile dysfunction) 08/28/2012  . Elevated liver enzymes 06/10/2019  . Epiretinal membrane (ERM) of right eye 06/10/2019   Right eye  saw AE 06/05/2019    . Essential hypertension 05/07/2013   Last Assessment & Plan:  Formatting of this note might be different from the original. HTN PLAN   BP goal is <140/90 BP Readings from Last 3 Encounters:  08/27/16 140/79  08/13/16 141/80  08/06/16 144/86   Current Anti-Hyptensive Medications: None  Today's Recommendations: Continue lifestyle modifications. Defer starting medication to PCP.  Recommend continued regular exercise as tolerated. Recomm  . History of UTI    s/p foley in hospital 01/2019   . Hyperlipidemia   . Hypothyroidism   . Illiterate 11/19/2013   Last Assessment & Plan:  Assisted patient with Handicapped Placard application today.    . Insomnia 05/08/2019  . Neuropathy   . Peripheral neuropathy   . Peripheral vascular disease ([place])   . Second degree heart block   . Secondary hyperparathyroidism ([place])    Renal  . Thyroid disease     Patient Active Problem List   Diagnosis Date Noted  . Atrioventricular block, Mobitz  type 1, Wenckebach   . Left-sided weakness   . Cerebrovascular accident (CVA) due to embolism of right carotid artery ([place])   . Stenosis of right carotid artery   . Arterial hypotension   . Cerebral embolism with cerebral infarction 10/08/2019  . Slurred speech 10/07/2019  . Epiretinal membrane (ERM) of right eye 06/10/2019  . Elevated liver enzymes 06/10/2019  . Benign prostatic hyperplasia 05/08/2019  . Insomnia 05/08/2019  . Coronary artery disease involving native coronary artery of native heart without angina pectoris 05/08/2019  . Atherosclerosis 05/08/2019  . Hyperphosphatemia 05/08/2019  . Secondary hyperparathyroidism ([place])   . Hyperlipidemia   . Gout   . Anemia of chronic kidney failure   . Near syncope 01/21/2019  . Elevated troponin 01/21/2019  . Osteoarthritis of midfoot, right 08/13/2016  . Foot swelling 07/04/2016  . DM type 2 with diabetic peripheral neuropathy ([place]) 11/28/2015  . Pre-transplant evaluation for kidney transplant 11/28/2015  . HLD (hyperlipidemia) 08/01/2015  . 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 Chronic idiopathic gout of multiple sites 02/10/2015  . Illiterate 11/19/2013  . ESRD (end stage renal disease) ([place]) 05/07/2013  . Essential hypertension 05/07/2013  . Osteoarthritis 05/07/2013  . Diastasis recti 10/29/2012  . 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 ED (erectile dysfunction) 08/28/2012  . Cataracts, bilateral 05/15/2012  . Chronic pain 05/15/2012  . DM (diabetes mellitus) ([place]) 05/15/2012  . Gout of knee 05/15/2012  . Hyperkalemia 05/15/2012  . Hypothyroidism 05/15/2012    Past Surgical History:  Procedure Laterality Date  . AV FISTULA PLACEMENT Left 11/19/2017   Procedure: ARTERIOVENOUS (AV) FISTULA CREATION;  Surgeon: [name], MD;  Location: [place];  Service: Vascular;  Laterality: Left;  . BASCILIC VEIN  TRANSPOSITION Left 06/04/2018   Procedure: BASILIC VEIN TRANSPOSITION ARM;  Surgeon: [name], MD;  Location: [facility];  Service: Vascular;  Laterality: Left;  . COLONOSCOPY    . DIALYSIS/PERMA CATHETER REMOVAL N/A 03/23/2019   Procedure: DIALYSIS/PERMA CATHETER REMOVAL;  Surgeon: [name], MD;  Location: [facility];  Service: Cardiovascular;  Laterality: N/A;  . EYE SURGERY     cataract surgery b/l; photophobia since   . INSERTION OF DIALYSIS CATHETER N/A 01/23/2019   Procedure: INSERTION OF DIALYSIS CATHETER;  Surgeon: [name], MD;  Location: [facility];  Service: Vascular;  Laterality: N/A;  INSERTION OF DIALYSIS CATHETER  . IR ANGIO  INTRA EXTRACRAN SEL COM CAROTID INNOMINATE UNI L MOD SED  10/15/2019  . IR ANGIO VERTEBRAL SEL SUBCLAVIAN INNOMINATE UNI R MOD SED  10/15/2019  . IR CT HEAD LTD  10/15/2019  . IR FLUORO GUIDE CV LINE RIGHT  10/08/2019  . IR INTRAVSC STENT CERV CAROTID W/EMB-PROT MOD SED INCL ANGIO  10/15/2019  . IR THROMBECTOMY AV FISTULA W/THROMBOLYSIS/PTA INC/SHUNT/IMG LEFT Left 10/09/2019  . IR US GUIDE VASC ACCESS LEFT  10/09/2019  . IR US GUIDE VASC ACCESS RIGHT  10/08/2019  . KNEE ARTHROSCOPY Left    Knee  . PERIPHERAL VASCULAR THROMBECTOMY Left 07/06/2019   Procedure: PERIPHERAL VASCULAR THROMBECTOMY;  Surgeon: [name], MD;  Location: [facility];  Service: Cardiovascular;  Laterality: Left;  . RADIOLOGY WITH ANESTHESIA N/A 10/15/2019   Procedure: stent placement;  Surgeon: Luanne Bras, MD;  Location: [facility];  Service: Radiology;  Laterality: N/A;  . THROMBECTOMY W/ EMBOLECTOMY Left 01/27/2019   Procedure: THROMBECTOMY Left arm ARTERIOVENOUS FISTULA  and Left arm Arteriovenous gortex graft;  Surgeon: [name], MD;  Location: [place];  Service: Vascular;  Laterality: Left;  Marland Kitchen VENOGRAM Left 01/27/2019   Procedure: Left arm FISTULOGRAM/;  Surgeon: [name], MD;  Location: [facility] OR;  Service: Vascular;  Laterality: Left;     Current Outpatient Medications  Medication Sig Dispense Refill  . acetaminophen (TYLENOL) 325 MG tablet Take 650 mg by mouth every 4 (four) hours as needed (for chronic pain).     Marland Kitchen allopurinol (ZYLOPRIM) 100 MG tablet Take 1 tablet (100 mg total) by mouth daily. 90 tablet 3  . Amino Acids-Protein Hydrolys (FEEDING SUPPLEMENT, PRO-STAT SUGAR FREE 64,) LIQD Take 30 mLs by mouth daily.    Marland Kitchen aspirin EC 81 MG tablet Take 81 mg daily by mouth.    Marland Kitchen atorvastatin (LIPITOR) 20 MG tablet Take 20 mg by mouth daily at 6 PM.     . blood glucose meter kit and supplies Dispense based on patient and insurance preference. Use up to two times daily as directed. (FOR ICD-10 E10.9, E11.9). STRIPS and LANCETS 1 year supply 1 each 0  . calcitRIOL (ROCALTROL) 0.5 MCG capsule Take 1 capsule (0.5 mcg total) by mouth Every Tuesday,Thursday,and Saturday with dialysis. 30 capsule 1  . clotrimazole (LOTRIMIN) 1 % cream Apply 1 application topically daily as needed (For yeast, groin rash).    Marland Kitchen docusate sodium (COLACE) 100 MG capsule Take 200 mg by mouth daily as needed.     Marland Kitchen ketoconazole (NIZORAL) 2 % shampoo Apply 1 application topically once. On Monday and Thursday    . levothyroxine (SYNTHROID) 175 MCG tablet Take 1 tablet (175 mcg total) by mouth daily before breakfast. 30 tablet 0  . lidocaine-prilocaine (EMLA) cream Apply 1 application topically See admin instructions. Apply to access site 1 to 2 hours before dialysis. Cover with occlusive dressing.On Tuesday,Thursday and Saturday    . midodrine (PROAMATINE) 10 MG tablet Take 1 tablet (10 mg total) by mouth 3 (three) times daily with meals. (Patient taking differently: Take 10 mg by mouth See admin instructions. With meals. Administer 1/2 hour before dialysis on Tuesday, Thursday, Saturday.) 270 tablet 3  . Multiple Vitamin (MULTIVITAMIN WITH MINERALS) TABS tablet Take 1 tablet by mouth daily.    . Nutritional Supplements (FEEDING SUPPLEMENT, NEPRO CARB STEADY,)  LIQD Take 237 mLs by mouth 3 (three) times daily with meals. 230 mL 0  . polyethylene glycol (MIRALAX / GLYCOLAX) 17 g packet Take 17 g by  mouth daily. 14 each 0  . sevelamer carbonate (RENVELA) 800 MG tablet Take 3 tablets (2,400 mg total) by mouth 3 (three) times daily with meals. 120 tablet 0  . tamsulosin (FLOMAX) 0.4 MG CAPS capsule Take 1 capsule (0.4 mg total) by mouth daily. (Patient taking differently: Take 0.8 mg by mouth daily. ) 30 capsule 1  . ticagrelor (BRILINTA) 90 MG TABS tablet Take 1 tablet (90 mg total) by mouth 2 (two) times daily. 60 tablet 3  . traZODone (DESYREL) 50 MG tablet Take 25 mg by mouth at bedtime.    . Zinc Oxide (BALMEX EX) Apply 1 application topically daily as needed (to irritated areas of skin).     Marland Kitchen donepezil (ARICEPT) 5 MG tablet Take 1 tablet (5 mg total) by mouth at bedtime. 30 tablet 6  . pantoprazole (PROTONIX) 40 MG tablet Take 1 tablet (40 mg total) by mouth daily. 30 tablet 1   No current facility-administered medications for this visit.    Allergies as of 12/23/2019 - Review Complete 12/23/2019  Allergen Reaction Noted  . Ace inhibitors Other (See Comments) 05/15/2012  . Angiotensin receptor blockers Other (See Comments) 05/15/2012  . Ivp dye [iodinated diagnostic agents] Other (See Comments) 01/21/2019  . Adhesive [tape] Other (See Comments) 01/21/2019    Vitals: BP (!) 103/50 (BP Location: Right Arm, Patient Position: Sitting)   Pulse 81   Temp 97.9 F (36.6 C) Comment: taken at front  Wt 198 lb (89.8 kg)   BMI 30.11 kg/m  Last Weight:  Wt Readings from Last 1 Encounters:  12/23/19 198 lb (89.8 kg)   Last Height:   Ht Readings from Last 1 Encounters:  12/14/19 _0  (1.727 m)     Physical exam: Exam: Gen: NAD, conversant, tangential and difficult to redirect              CV: SEM, RRR. No Carotid Bruits. No peripheral edema, warm, nontender Eyes: Conjunctivae clear without exudates or hemorrhage  Neuro: Detailed  Neurologic Exam  Speech: stuttering and perseveration, difficulty redirecting  Cognition:  MMSE - Mini Mental State Exam 12/23/2019  Not completed: (No Data)  Orientation to time 4  Orientation to Place 4  Registration 3  Attention/ Calculation 1  Recall 1  Language- name 2 objects 2  Language- repeat 1  Language- follow 3 step command 3  Language- read & follow direction 1  Write a sentence (No Data)  Write a sentence-comments pt unable to write. said he could only write his name.  Copy design 0    Cranial Nerves:    The pupils are equal, round, and reactive to light. Attempted fundoscopy could not visualize due to small pupils. Visual fields are full to finger threat. Extraocular movements are intact. Trigeminal sensation is intact and the muscles of mastication are normal. The face is symmetric. The palate elevates in the midline. Hearing intact. Voice is normal. Shoulder shrug is normal. The tongue has normal motion without fasciculations.   Coordination:    No dysmetria noted   Gait:    Cannot stand independently, needs walking aid  Motor Observation:    No asymmetry, no atrophy, and no involuntary movements noted. Tone:    Normal muscle tone.    Posture:    Posture is normal in the wheelchair    Strength: mild lower extremity proximal weakness otherwise strength appears intact.       Sensation: intact to LT     Reflex Exam:  DTR's:  Deep tendon reflexes in the upper and lower extremities are symmetrical bilaterally.   Toes:    The toes are equiv bilaterally.   Clonus:    Clonus is absent.    Assessment/Plan:  79 y.o. male here as requested by McLean-Scocuzza, Olivia Mackie * for hospital follow up. 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  -Multiple bilateral scattered infarcts secondary to hypoperfusion in the setting of right ICA high-grade stenosis, status post stenting.  Due to atherosclerosis, I do recommend patient continue midodrine 3 times daily regardless of dialysis day or not, and goal systolic greater than 789 to avoid hypotension and hypoperfusion - LDL was 35, hemoglobin A1c 6.7 both within goal -Continue cholesterol and diabetes management -He had endovascular revascularization of symptomatic high-grade stenosis of the right internal carotid artery with stent on October 15, 2019, he was supposed to follow-up in the clinic 4 weeks post discharge, with ultrasound of the neck in 3 months from the time of treatment.  Please schedule a follow-up with Dr. Estanislado Pandy in Interventional Radiology - May benefit from [place], will start Aricept low dose then increase to 41m as tolerated and then start [place] - Likely vascular dementia, but still may be helpful to try the aricept and namenda - no driving - fall risk  - For anxiety I would defer to assisted living physician as there are many options, but I am available if needed for consult - Continue asa and Brillinta for secondary stroke prevention  Meds ordered this encounter  Medications  . donepezil (ARICEPT) 5 MG tablet    Sig: Take 1 tablet (5 mg total) by mouth at bedtime.    Dispense:  30 tablet    Refill:  6    Cc: McLean-Scocuzza, TOlivia Mackie*,  [facility] [phone] and [phone] are the physicians will fax note to both numbers.  ASarina Ill MD  [facility] [address] [address] Phone [phone] [phone] I spent 60   minutes of face-to-face and non-face-to-face time with patient on the  1. Cerebrovascular accident (CVA) due to embolism of right carotid artery ([place]    diagnosis.  This included previsit chart review, lab review, study review, order entry, electronic health record documentation, patient education on the different diagnostic and therapeutic options, counseling and coordination of care, risks and benefits of management, compliance, or risk factor reduction

## 2019-12-23 ENCOUNTER — Encounter: Payer: Self-pay | Admitting: Neurology

## 2019-12-23 ENCOUNTER — Ambulatory Visit (INDEPENDENT_AMBULATORY_CARE_PROVIDER_SITE_OTHER): Payer: Medicare Other | Admitting: Neurology

## 2019-12-23 ENCOUNTER — Other Ambulatory Visit: Payer: Self-pay

## 2019-12-23 VITALS — BP 103/50 | HR 81 | Temp 97.9°F | Wt 198.0 lb

## 2019-12-23 DIAGNOSIS — I63131 Cerebral infarction due to embolism of right carotid artery: Secondary | ICD-10-CM | POA: Diagnosis not present

## 2019-12-23 DIAGNOSIS — I6521 Occlusion and stenosis of right carotid artery: Secondary | ICD-10-CM | POA: Diagnosis not present

## 2019-12-23 MED ORDER — DONEPEZIL HCL 5 MG PO TABS: 5.0000 mg | ORAL_TABLET | Freq: Every day | ORAL | 6 refills | Status: AC

## 2019-12-23 NOTE — Patient Instructions (Addendum)
Start Aricept 5mg  can email me and we can increase to 10mg  Net we can start Namenda(Memantine) Continue current medications for stroke   Vascular Dementia Dementia is a condition in which a person has problems with thinking, memory, and behavior that are severe enough to interfere with daily life. Vascular dementia is a type of dementia. It results from brain damage that is caused by the brain not getting enough blood. This condition may also be called vascular cognitive impairment. What are the causes? Vascular dementia is caused by conditions that lessen blood flow to the brain. Common causes of this condition include:  Multiple small strokes. These may happen without symptoms (silent stroke).  Major stroke.  Damage to small blood vessels in the brain (cerebral small vessel disease). What increases the risk? The following factors may make you more likely to develop this condition:  Having had a stroke.  Having high blood pressure (hypertension) or high cholesterol.  Having a disease that affects the heart or blood vessels.  Smoking.  Having diabetes.  Having metabolic syndrome.  Being obese.  Not being active.  Having depression.  Being over age 71. What are the signs or symptoms? Symptoms can vary from one person to another. Symptoms may be mild or severe depending on the amount of damage and which parts of the brain have been affected. Symptoms may begin suddenly or may develop slowly. Mental symptoms of vascular dementia may include:  Confusion.  Memory problems.  Poor attention and concentration.  Trouble understanding speech.  Depression.  Personality changes.  Trouble recognizing familiar people.  Agitation or aggression.  Paranoia.  Delusions or hallucinations. Physical symptoms of vascular dementia may include:  Weakness.  Poor balance.  Loss of bladder or bowel control (incontinence).  Unsteady walking (gait).  Speaking  problems. Behavioral symptoms of vascular dementia may include:  Getting lost in familiar places.  Problems with planning and judgment.  Trouble following instructions.  Social problems.  Emotional outbursts.  Trouble with daily activities and self-care.  Problems handling money. Symptoms may remain stable, or they may get worse over time. Symptoms of vascular dementia may be similar to those of Alzheimer's disease. The two conditions can occur together (mixed dementia). How is this diagnosed? Your health care provider will consider your medical history and symptoms or changes that are reported by friends and family. Your health care provider will do a physical exam and may order lab tests or other tests that check brain and nervous system function. Tests that may be done include:  Blood tests.  Brain imaging tests.  Tests of movement, speech, and other daily activities (neurological exam).  Tests of memory, thinking, and problem-solving (neuropsychological or neurocognitive testing). There is not a specific test to diagnose vascular dementia. Diagnosis may involve several specialists. These may include:  A health care provider who specializes in the brain and nervous system (neurologist).  A health provider who specializes in understanding how problems in the brain can alter behavior and cognitive function (neuropsychologist). How is this treated? There is no cure for vascular dementia. Brain damage that has already occurred cannot be reversed. Treatment depends on:  How severe the condition is.  Which parts of your brain have been affected.  Your overall health. Treatment measures aim to:  Treat the underlying cause of vascular dementia and manage risk factors. This may include: ? Controlling blood pressure. ? Lowering cholesterol. ? Treating diabetes. ? Quitting smoking. ? Losing weight or maintaining a healthy weight. ? Eating a healthy,  balanced diet. ? Getting  regular exercise.  Manage symptoms.  Prevent further brain damage.  Improve the person's health and quality of life. Treatment for dementia may involve a team of health care providers, including:  A neurologist.  A provider who specializes in disorders of the mind (psychiatrist).  A provider who specializes in helping people learn daily living skills (occupational therapist).  A provider who focuses on speech and language changes (Electrical engineer).  A heart specialist (cardiologist).  A provider who helps people learn how to manage physical changes, such as movement and walking (exercise physiologist or physical therapist). Follow these instructions at home: Lifestyle  People with vascular dementia may need regular help at home or daily care from a family member or home health care worker. Home care for a person with vascular dementia depends on what caused the condition and how severe the symptoms are. General guidelines for caregivers include:  Help the person with dementia remember people, appointments, and daily activities.  Help the person with dementia manage his or her medicines.  Help family and friends learn about ways to communicate with the person with dementia.  Create a safe living space to reduce the risk of injury or falls.  Find a support group to help caregivers and family cope with the effects of dementia.  General instructions  Help the person take over-the-counter and prescription medicines only as told by the health care provider.  Follow the health care provider's instructions for treating the condition that caused the dementia.  Make sure the person keeps all follow-up visits as told by the health care provider. This is important. Contact a health care provider if:  A fever develops.  New behavioral problems develop.  Problems with swallowing develop.  Confusion gets worse.  Sleepiness gets worse. Get help right away if:  Loss of  consciousness occurs.  There is a sudden loss of speech, balance, or thinking ability.  New numbness or paralysis occurs.  Sudden, severe headache occurs.  Vision is lost or suddenly gets worse in one or both eyes. Summary  Vascular dementia is a type of dementia. It results from brain damage that is caused by the brain not getting enough blood.  Vascular dementia is caused by conditions that lessen blood flow to the brain. Common causes of this condition include stroke and damage to small blood vessels in the brain.  Treatment focuses on treating the underlying cause of vascular dementia and managing any risk factors.  People with vascular dementia may need regular help at home or daily care from a family member or home health care worker.  Contact a health care provider if you or your caregiver notice any new symptoms. This information is not intended to replace advice given to you by your health care provider. Make sure you discuss any questions you have with your health care provider. Document Revised: 05/28/2018 Document Reviewed: 05/29/2018 Elsevier Patient Education  [place].   Donepezil tablets What is this medicine? DONEPEZIL (doe NEP e zil) is used to treat mild to moderate dementia caused by Alzheimer's disease. This medicine may be used for other purposes; ask your health care provider or pharmacist if you have questions. COMMON BRAND NAME(S): Aricept What should I tell my health care provider before I take this medicine? They need to know if you have any of these conditions:  asthma or other lung disease  difficulty passing urine  head injury  heart disease  history of irregular heartbeat  liver disease  seizures (convulsions)  stomach or intestinal disease, ulcers or stomach bleeding  an unusual or allergic reaction to donepezil, other medicines, foods, dyes, or preservatives  pregnant or trying to get pregnant  breast-feeding How should  I use this medicine? Take this medicine by mouth with a glass of water. Follow the directions on the prescription label. You may take this medicine with or without food. Take this medicine at regular intervals. This medicine is usually taken before bedtime. Do not take it more often than directed. Continue to take your medicine even if you feel better. Do not stop taking except on your doctor's advice. If you are taking the 23 mg donepezil tablet, swallow it whole; do not cut, crush, or chew it. Talk to your pediatrician regarding the use of this medicine in children. Special care may be needed. Overdosage: If you think you have taken too much of this medicine contact a poison control center or emergency room at once. NOTE: This medicine is only for you. Do not share this medicine with others. What if I miss a dose? If you miss a dose, take it as soon as you can. If it is almost time for your next dose, take only that dose, do not take double or extra doses. What may interact with this medicine? Do not take this medicine with any of the following medications:  certain medicines for fungal infections like itraconazole, fluconazole, posaconazole, and voriconazole  cisapride  dextromethorphan; quinidine  dronedarone  pimozide  quinidine  thioridazine This medicine may also interact with the following medications:  antihistamines for allergy, cough and cold  atropine  bethanechol  carbamazepine  certain medicines for bladder problems like oxybutynin, tolterodine  certain medicines for Parkinson's disease like benztropine, trihexyphenidyl  certain medicines for stomach problems like dicyclomine, hyoscyamine  certain medicines for travel sickness like scopolamine  dexamethasone  dofetilide  ipratropium  NSAIDs, medicines for pain and inflammation, like ibuprofen or naproxen  other medicines for Alzheimer's disease  other medicines that prolong the QT interval (cause an  abnormal heart rhythm)  phenobarbital  phenytoin  rifampin, rifabutin or rifapentine  ziprasidone This list may not describe all possible interactions. Give your health care provider a list of all the medicines, herbs, non-prescription drugs, or dietary supplements you use. Also tell them if you smoke, drink alcohol, or use illegal drugs. Some items may interact with your medicine. What should I watch for while using this medicine? Visit your doctor or health care professional for regular checks on your progress. Check with your doctor or health care professional if your symptoms do not get better or if they get worse. You may get drowsy or dizzy. Do not drive, use machinery, or do anything that needs mental alertness until you know how this drug affects you. What side effects may I notice from receiving this medicine? Side effects that you should report to your doctor or health care professional as soon as possible:  allergic reactions like skin rash, itching or hives, swelling of the face, lips, or tongue  feeling faint or lightheaded, falls  loss of bladder control  seizures  signs and symptoms of a dangerous change in heartbeat or heart rhythm like chest pain; dizziness; fast or irregular heartbeat; palpitations; feeling faint or lightheaded, falls; breathing problems  signs and symptoms of infection like fever or chills; cough; sore throat; pain or trouble passing urine  signs and symptoms of liver injury like dark yellow or brown urine; general ill feeling or flu-like symptoms;  light-colored stools; loss of appetite; nausea; right upper belly pain; unusually weak or tired; yellowing of the eyes or skin  slow heartbeat or palpitations  unusual bleeding or bruising  vomiting Side effects that usually do not require medical attention (report to your doctor or health care professional if they continue or are bothersome):  diarrhea, especially when starting  treatment  headache  loss of appetite  muscle cramps  nausea  stomach upset This list may not describe all possible side effects. Call your doctor for medical advice about side effects. You may report side effects to FDA at 1-800-FDA-1088. Where should I keep my medicine? Keep out of reach of children. Store at room temperature between 15 and 30 degrees C (59 and 86 degrees F). Throw away any unused medicine after the expiration date. NOTE: This sheet is a summary. It may not cover all possible information. If you have questions about this medicine, talk to your doctor, pharmacist, or health care provider.  2020 Elsevier/Gold Standard (2018-08-18 10:33:41) Memantine Tablets What is this medicine? MEMANTINE (MEM an teen) is used to treat dementia caused by Alzheimer's disease. This medicine may be used for other purposes; ask your health care provider or pharmacist if you have questions. COMMON BRAND NAME(S): Namenda What should I tell my health care provider before I take this medicine? They need to know if you have any of these conditions:  difficulty passing urine  kidney disease  liver disease  seizures  an unusual or allergic reaction to memantine, other medicines, foods, dyes, or preservatives  pregnant or trying to get pregnant  breast-feeding How should I use this medicine? Take this medicine by mouth with a glass of water. Follow the directions on the prescription label. You may take this medicine with or without food. Take your doses at regular intervals. Do not take your medicine more often than directed. Continue to take your medicine even if you feel better. Do not stop taking except on the advice of your doctor or health care professional. Talk to your pediatrician regarding the use of this medicine in children. Special care may be needed. Overdosage: If you think you have taken too much of this medicine contact a poison control center or emergency room at  once. NOTE: This medicine is only for you. Do not share this medicine with others. What if I miss a dose? If you miss a dose, take it as soon as you can. If it is almost time for your next dose, take only that dose. Do not take double or extra doses. If you do not take your medicine for several days, contact your health care provider. Your dose may need to be changed. What may interact with this medicine?  acetazolamide  amantadine  cimetidine  dextromethorphan  dofetilide  hydrochlorothiazide  ketamine  metformin  methazolamide  quinidine  ranitidine  sodium bicarbonate  triamterene This list may not describe all possible interactions. Give your health care provider a list of all the medicines, herbs, non-prescription drugs, or dietary supplements you use. Also tell them if you smoke, drink alcohol, or use illegal drugs. Some items may interact with your medicine. What should I watch for while using this medicine? Visit your doctor or health care professional for regular checks on your progress. Check with your doctor or health care professional if there is no improvement in your symptoms or if they get worse. You may get drowsy or dizzy. Do not drive, use machinery, or do anything that needs mental  alertness until you know how this drug affects you. Do not stand or sit up quickly, especially if you are an older patient. This reduces the risk of dizzy or fainting spells. Alcohol can make you more drowsy and dizzy. Avoid alcoholic drinks. What side effects may I notice from receiving this medicine? Side effects that you should report to your doctor or health care professional as soon as possible:  allergic reactions like skin rash, itching or hives, swelling of the face, lips, or tongue  agitation or a feeling of restlessness  depressed mood  dizziness  hallucinations  redness, blistering, peeling or loosening of the skin, including inside the  mouth  seizures  vomiting Side effects that usually do not require medical attention (report to your doctor or health care professional if they continue or are bothersome):  constipation  diarrhea  headache  nausea  trouble sleeping This list may not describe all possible side effects. Call your doctor for medical advice about side effects. You may report side effects to FDA at 1-800-FDA-1088. Where should I keep my medicine? Keep out of the reach of children. Store at room temperature between 15 degrees and 30 degrees C (59 degrees and 86 degrees F). Throw away any unused medicine after the expiration date. NOTE: This sheet is a summary. It may not cover all possible information. If you have questions about this medicine, talk to your doctor, pharmacist, or health care provider.  2020 Elsevier/Gold Standard (2013-06-15 14:10:42)

## 2019-12-24 ENCOUNTER — Telehealth: Payer: Self-pay | Admitting: *Deleted

## 2019-12-24 NOTE — Telephone Encounter (Signed)
Office visit from 12/23/19 with Dr. Jaynee Eagles faxed to [facility] per Dr. Jaynee Eagles. Received a receipt of confirmation for all 3 faxes.

## 2019-12-25 ENCOUNTER — Telehealth: Payer: Self-pay | Admitting: Internal Medicine

## 2019-12-25 NOTE — Telephone Encounter (Signed)
Faxed Home health certification and plan of care to [facility] on 6/81/27 Cert date 01/08/69-0/1/74 Verbal date 12/18/19

## 2019-12-25 NOTE — Telephone Encounter (Signed)
Ok for orders? 

## 2019-12-25 NOTE — Telephone Encounter (Signed)
[place], from [facility], [phone]. She needs a verbal order for speech therapy 1x week for 5weeks.

## 2019-12-25 NOTE — Telephone Encounter (Signed)
Okay for orders? 

## 2019-12-28 ENCOUNTER — Telehealth: Payer: Self-pay | Admitting: Internal Medicine

## 2019-12-28 ENCOUNTER — Other Ambulatory Visit: Payer: Self-pay | Admitting: Internal Medicine

## 2019-12-28 DIAGNOSIS — G4700 Insomnia, unspecified: Secondary | ICD-10-CM

## 2019-12-28 MED ORDER — TRAZODONE HCL 50 MG PO TABS: 25.0000 mg | ORAL_TABLET | Freq: Every evening | ORAL | 3 refills | Status: DC | PRN

## 2019-12-28 NOTE — Telephone Encounter (Signed)
Please advise 

## 2019-12-28 NOTE — Telephone Encounter (Signed)
T[place]ania with [facility] called and states that pt's rx of trazadone was discontinued and is taking melatonin instead. It is not helping him sleep and making him sick to his stomach. They would like to know why it is discontinued. Please call the nurse Stefari [phone] or [phone]

## 2019-12-28 NOTE — Telephone Encounter (Signed)
Sherry with [facility] states that the pt is in a facility and can not have a medication that has ranges in directions. The directions state 0.5 -1 and they need to know one or the other. Please correct and call pharmacy back. They would like this corrected today. [phone]

## 2019-12-28 NOTE — Telephone Encounter (Signed)
Please advise on dosage

## 2019-12-28 NOTE — Telephone Encounter (Signed)
Left detailed message on secure voice mail. 

## 2019-12-28 NOTE — Telephone Encounter (Signed)
I dont see where this was dc'ed I did not do this but if melatonin making sick and not helping stop Sent trazadone 25-50 mg qhs prn to [facility]   He needs appt with me if one not scheduled  I have not seen him since he was hospitalized   Thanks [place]

## 2019-12-28 NOTE — Telephone Encounter (Signed)
Trazadone 25 mg qhs resent inform [facility]   [place]

## 2019-12-29 ENCOUNTER — Telehealth: Payer: Self-pay | Admitting: Internal Medicine

## 2019-12-29 NOTE — Telephone Encounter (Signed)
Sherry informed

## 2019-12-29 NOTE — Telephone Encounter (Signed)
faxed medication change to [facility] on  trazadone and melatonin Fax # [phone]

## 2020-01-26 DIAGNOSIS — R197 Diarrhea, unspecified: Secondary | ICD-10-CM | POA: Insufficient documentation

## 2020-03-08 ENCOUNTER — Other Ambulatory Visit (INDEPENDENT_AMBULATORY_CARE_PROVIDER_SITE_OTHER): Payer: Self-pay | Admitting: Nurse Practitioner

## 2020-03-08 DIAGNOSIS — N186 End stage renal disease: Secondary | ICD-10-CM

## 2020-03-09 ENCOUNTER — Encounter (INDEPENDENT_AMBULATORY_CARE_PROVIDER_SITE_OTHER): Payer: Self-pay | Admitting: Nurse Practitioner

## 2020-03-09 ENCOUNTER — Other Ambulatory Visit: Payer: Self-pay

## 2020-03-09 ENCOUNTER — Ambulatory Visit (INDEPENDENT_AMBULATORY_CARE_PROVIDER_SITE_OTHER): Payer: Medicare Other

## 2020-03-09 ENCOUNTER — Ambulatory Visit (INDEPENDENT_AMBULATORY_CARE_PROVIDER_SITE_OTHER): Payer: Medicare Other | Admitting: Nurse Practitioner

## 2020-03-09 VITALS — BP 144/69 | HR 80 | Ht 67.0 in | Wt 203.0 lb

## 2020-03-09 DIAGNOSIS — E1142 Type 2 diabetes mellitus with diabetic polyneuropathy: Secondary | ICD-10-CM | POA: Diagnosis not present

## 2020-03-09 DIAGNOSIS — N186 End stage renal disease: Secondary | ICD-10-CM

## 2020-03-09 DIAGNOSIS — I10 Essential (primary) hypertension: Secondary | ICD-10-CM | POA: Diagnosis not present

## 2020-03-15 ENCOUNTER — Encounter (INDEPENDENT_AMBULATORY_CARE_PROVIDER_SITE_OTHER): Payer: Self-pay | Admitting: Nurse Practitioner

## 2020-03-15 NOTE — Progress Notes (Signed)
Subjective:    Patient ID: Frank [name], male    DOB: [birth year], 79 y.o.   MRN: [id number] Chief Complaint  Patient presents with  . Follow-up    U/S follow up    The patient returns to the office for followup of their dialysis access.  Patient is currently maintained via brachial axillary graft the function of the access has been stable. The patient denies increased bleeding time or increased recirculation. Patient denies difficulty with cannulation. The patient denies hand pain or other symptoms consistent with steal phenomena.  No significant arm swelling.  Patient most recently had intervention in April 2021 by Dr. Posey Pronto in [place] due to thrombosis.  The patient denies redness or swelling at the access site. The patient denies fever or chills at home or while on dialysis.  The patient denies amaurosis fugax or recent TIA symptoms. There are no recent neurological changes noted. The patient denies claudication symptoms or rest pain symptoms. The patient denies history of DVT, PE or superficial thrombophlebitis. The patient denies recent episodes of angina or shortness of breath.   Today noninvasive studies show a flow volume of 3017.  The brachial axillary graft is patent with no evidence of stenosis.      Review of Systems  Neurological: Positive for weakness.  All other systems reviewed and are negative.      Objective:   Physical Exam Vitals reviewed.  HENT:     Head: Normocephalic.  Cardiovascular:     Rate and Rhythm: Normal rate and regular rhythm.     Pulses: Normal pulses.          Radial pulses are 2+ on the right side and 2+ on the left side.     Heart sounds: Normal heart sounds.     Arteriovenous access: left arteriovenous access is present.    Comments: Brachial axillary graft good thrill and bruit Pulmonary:     Effort: Pulmonary effort is normal.     Breath sounds: Normal breath sounds.  Musculoskeletal:     Cervical back: Normal range of  motion.  Neurological:     Mental Status: He is alert and oriented to person, place, and time.  Psychiatric:        Mood and Affect: Mood normal.        Behavior: Behavior normal.        Thought Content: Thought content normal.        Judgment: Judgment normal.     BP (!) 144/69   Pulse 80   Ht '5\' 7"'  (1.702 m)   Wt 203 lb (92.1 kg)   BMI 31.79 kg/m   Past Medical History:  Diagnosis Date  . Anemia of chronic kidney failure   . Arthritis   . Bradycardia   . Cataracts, bilateral 05/15/2012  . Chronic idiopathic gout of multiple sites 02/10/2015  . Chronic kidney disease    [facility]  . Depression   . Diabetes mellitus without complication (HCC)    diet controlled   . Diabetic nephropathy ([place])   . ED (erectile dysfunction) 08/28/2012  . Elevated liver enzymes 06/10/2019  . Epiretinal membrane (ERM) of right eye 06/10/2019   Right eye saw AE 06/05/2019    . Essential hypertension 05/07/2013   Last Assessment & Plan:  Formatting of this note might be different from the original. HTN PLAN   BP goal is <140/90 BP Readings from Last 3 Encounters:  08/27/16 140/79  08/13/16 141/80  08/06/16 144/86  Current Anti-Hyptensive Medications: None  Today's Recommendations: Continue lifestyle modifications. Defer starting medication to PCP.  Recommend continued regular exercise as tolerated. Recomm  . History of UTI    s/p foley in hospital 01/2019   . Hyperlipidemia   . Hypothyroidism   . Illiterate 11/19/2013   Last Assessment & Plan:  Assisted patient with Handicapped Placard application today.    . Insomnia 05/08/2019  . Neuropathy   . Peripheral neuropathy   . Peripheral vascular disease ([place])   . Second degree heart block   . Secondary hyperparathyroidism ([place])    Renal  . Thyroid disease     Social History   Socioeconomic History  . Marital status: Divorced    Spouse name: Not on file  . Number of children: 4  . Years of education: Not on file  . Highest education level:  8th grade  Occupational History  . Not on file  Tobacco Use  . Smoking status: Former Smoker    Years: 30.00    Quit date: 1994    Years since quitting: 27.5  . Smokeless tobacco: Never Used  Vaping Use  . Vaping Use: Never used  Substance and Sexual Activity  . Alcohol use: No  . Drug use: No  . Sexual activity: Not Currently  Other Topics Concern  . Not on file  Social History Narrative   4 kids (3 daughters and 1 son)   Daughter [name] DPR      Lives at [facility]    Social Determinants of Health   Financial Resource Strain: Low Risk   . Difficulty of Paying Living Expenses: Not hard at all  Food Insecurity: No Food Insecurity  . Worried About Charity fundraiser in the Last Year: Never true  . Ran Out of Food in the Last Year: Never true  Transportation Needs: No Transportation Needs  . Lack of Transportation (Medical): No  . Lack of Transportation (Non-Medical): No  Physical Activity:   . Days of Exercise per Week:   . Minutes of Exercise per Session:   Stress: No Stress Concern Present  . Feeling of Stress : Only a little  Social Connections: Unknown  . Frequency of Communication with Friends and Family: More than three times a week  . Frequency of Social Gatherings with Friends and Family: Not on file  . Attends Religious Services: Not on file  . Active Member of Clubs or Organizations: Not on file  . Attends Archivist Meetings: Not on file  . Marital Status: Divorced  Human resources officer Violence: Not At Risk  . Fear of Current or Ex-Partner: No  . Emotionally Abused: No  . Physically Abused: No  . Sexually Abused: No    Past Surgical History:  Procedure Laterality Date  . AV FISTULA PLACEMENT Left 11/19/2017   Procedure: ARTERIOVENOUS (AV) FISTULA CREATION;  Surgeon: [name], MD;  Location: [facility];  Service: Vascular;  Laterality: Left;  . BASCILIC VEIN TRANSPOSITION Left 06/04/2018   Procedure: BASILIC VEIN  TRANSPOSITION ARM;  Surgeon: [name], MD;  Location: [facility];  Service: Vascular;  Laterality: Left;  . COLONOSCOPY    . DIALYSIS/PERMA CATHETER REMOVAL N/A 03/23/2019   Procedure: DIALYSIS/PERMA CATHETER REMOVAL;  Surgeon: [name], MD;  Location: [facility];  Service: Cardiovascular;  Laterality: N/A;  . EYE SURGERY     cataract surgery b/l; photophobia since   . INSERTION OF DIALYSIS CATHETER N/A 01/23/2019   Procedure:  INSERTION OF DIALYSIS CATHETER;  Surgeon: [name], MD;  Location: [facility] OR;  Service: Vascular;  Laterality: N/A;  INSERTION OF DIALYSIS CATHETER  . IR ANGIO INTRA EXTRACRAN SEL COM CAROTID INNOMINATE UNI L MOD SED  10/15/2019  . IR ANGIO VERTEBRAL SEL SUBCLAVIAN INNOMINATE UNI R MOD SED  10/15/2019  . IR CT HEAD LTD  10/15/2019  . IR FLUORO GUIDE CV LINE RIGHT  10/08/2019  . IR INTRAVSC STENT CERV CAROTID W/EMB-PROT MOD SED INCL ANGIO  10/15/2019  . IR THROMBECTOMY AV FISTULA W/THROMBOLYSIS/PTA INC/SHUNT/IMG LEFT Left 10/09/2019  . IR US GUIDE VASC ACCESS LEFT  10/09/2019  . IR US GUIDE VASC ACCESS RIGHT  10/08/2019  . KNEE ARTHROSCOPY Left    Knee  . PERIPHERAL VASCULAR THROMBECTOMY Left 07/06/2019   Procedure: PERIPHERAL VASCULAR THROMBECTOMY;  Surgeon: [name], MD;  Location: [facility];  Service: Cardiovascular;  Laterality: Left;  . RADIOLOGY WITH ANESTHESIA N/A 10/15/2019   Procedure: stent placement;  Surgeon: Luanne Bras, MD;  Location: [facility];  Service: Radiology;  Laterality: N/A;  . THROMBECTOMY W/ EMBOLECTOMY Left 01/27/2019   Procedure: THROMBECTOMY Left arm ARTERIOVENOUS FISTULA  and Left arm Arteriovenous gortex graft;  Surgeon: [name], MD;  Location: [facility];  Service: Vascular;  Laterality: Left;  Marland Kitchen VENOGRAM Left 01/27/2019   Procedure: Left arm FISTULOGRAM/;  Surgeon: [name], MD;  Location[facility];  Service: Vascular;  Laterality: Left;    Family History  Problem Relation Age of Onset  .  Diabetes Mother   . Kidney disease Mother   . Liver disease Mother   . Heart Problems Mother   . Stroke Neg Hx     Allergies  Allergen Reactions  . Ace Inhibitors Other (See Comments)    Hyperkalemia on ACE INHIBITORS Cr>1.9  . Angiotensin Receptor Blockers Other (See Comments)    Hyperkalemia Elevates Cr > 1.9   . Ivp Dye [Iodinated Diagnostic Agents] Other (See Comments)    Cannot have due to renal failure  . Adhesive [Tape] Other (See Comments)    UNDESCRIBED REACTION Can tolerate ONLY Coban wrap or mild paper tape!!       Assessment & Plan:   1. ESRD (end stage renal disease) ([place]) Recommend:  The patient is doing well and currently has adequate dialysis access. The patient's dialysis center is not reporting any access issues. Flow pattern is stable when compared to the prior ultrasound.  The patient should have a duplex ultrasound of the dialysis access in 6 months Per the patient's daughter the patient follows with Dr. Posey Pronto on a regular basis at his dialysis center as well as he also maintains constant follow-up of his AV fistula as such we will continue to allow the patient to follow with Dr. Posey Pronto for his access and they will contact her office if our intervention is needed.    2. Essential hypertension Continue antihypertensive medications as already ordered, these medications have been reviewed and there are no changes at this time.   3. DM type 2 with diabetic peripheral neuropathy ([place]) Continue hypoglycemic medications as already ordered, these medications have been reviewed and there are no changes at this time.  Hgb A1C to be monitored as already arranged by primary service    Current Outpatient Medications on File Prior to Visit  Medication Sig Dispense Refill  . acetaminophen (TYLENOL) 325 MG tablet Take 325 mg by mouth every 6 (six) hours as needed.    Marland Kitchen allopurinol (  ZYLOPRIM) 100 MG tablet Take 1 tablet (100 mg total) by mouth daily. 90 tablet 3   . ALPRAZolam (XANAX) 0.25 MG tablet     . alum & mag hydroxide-simeth (MAALOX/MYLANTA) 200-200-20 MG/5ML suspension Take 5 mLs by mouth every 6 (six) hours as needed for indigestion or heartburn.    Marland Kitchen aspirin EC 81 MG tablet Take 81 mg daily by mouth.    Marland Kitchen atorvastatin (LIPITOR) 20 MG tablet Take 20 mg by mouth daily at 6 PM.     . benzonatate (TESSALON) 200 MG capsule Take 200 mg by mouth 3 (three) times daily as needed for cough.    . bisacodyl (DULCOLAX) 10 MG suppository Place 10 mg rectally as needed for moderate constipation.    . bismuth subsalicylate (PEPTO BISMOL) 262 MG/15ML suspension Take 15 mLs by mouth every 6 (six) hours as needed.    . clotrimazole (LOTRIMIN) 1 % cream Apply 1 application topically daily as needed (For yeast, groin rash).    . diphenhydrAMINE HCl (DIPHENHYDRAMINE COUGH PO) Take by mouth. One a day as needed for itching or insomnia    . docusate sodium (COLACE) 100 MG capsule Take 200 mg by mouth daily as needed.     . donepezil (ARICEPT) 5 MG tablet Take 1 tablet (5 mg total) by mouth at bedtime. 30 tablet 6  . guaifenesin (ROBITUSSIN) 100 MG/5ML syrup Take 100 mg by mouth 3 (three) times daily as needed for cough.    Marland Kitchen ketoconazole (NIZORAL) 2 % shampoo Apply 1 application topically once. On Monday and Thursday    . levothyroxine (SYNTHROID) 175 MCG tablet Take 1 tablet (175 mcg total) by mouth daily before breakfast. 30 tablet 0  . lidocaine-prilocaine (EMLA) cream Apply 1 application topically See admin instructions. Apply to access site 1 to 2 hours before dialysis. Cover with occlusive dressing.On Tuesday,Thursday and Saturday    . loperamide (IMODIUM) 2 MG capsule Take 2 mg by mouth as needed for diarrhea or loose stools.    . magnesium hydroxide (MILK OF MAGNESIA) 400 MG/5ML suspension Take 5 mLs by mouth daily as needed for mild constipation. 30cc 2 tablespoons full  By mounth as needed for one dose    . Melatonin 3 MG CAPS Take by mouth. One tab po at bed  time    . midodrine (PROAMATINE) 10 MG tablet Take 1 tablet (10 mg total) by mouth 3 (three) times daily with meals. (Patient taking differently: Take 10 mg by mouth See admin instructions. With meals. Administer 1/2 hour before dialysis on Tuesday, Thursday, Saturday.) 270 tablet 3  . Multiple Vitamin (MULTIVITAMIN WITH MINERALS) TABS tablet Take 1 tablet by mouth daily.    . polyethylene glycol (MIRALAX / GLYCOLAX) 17 g packet Take 17 g by mouth daily. 14 each 0  . senna (SENOKOT) 8.6 MG TABS tablet Take 1 tablet by mouth.    . sevelamer carbonate (RENVELA) 800 MG tablet Take 3 tablets (2,400 mg total) by mouth 3 (three) times daily with meals. 120 tablet 0  . sodium phosphate Pediatric (FLEET) 3.5-9.5 GM/59ML enema Place 1 enema rectally once.    . tamsulosin (FLOMAX) 0.4 MG CAPS capsule Take 1 capsule (0.4 mg total) by mouth daily. (Patient taking differently: Take 0.8 mg by mouth daily. ) 30 capsule 1  . ticagrelor (BRILINTA) 90 MG TABS tablet Take 1 tablet (90 mg total) by mouth 2 (two) times daily. 60 tablet 3  . traZODone (DESYREL) 50 MG tablet Take 0.5 tablets (25 mg total)  by mouth at bedtime as needed for sleep. 90 tablet 3  . Zinc Oxide (BALMEX EX) Apply 1 application topically daily as needed (to irritated areas of skin).     . Amino Acids-Protein Hydrolys (FEEDING SUPPLEMENT, PRO-STAT SUGAR FREE 64,) LIQD Take 30 mLs by mouth daily. (Patient not taking: Reported on 03/09/2020)    . blood glucose meter kit and supplies Dispense based on patient and insurance preference. Use up to two times daily as directed. (FOR ICD-10 E10.9, E11.9). STRIPS and LANCETS 1 year supply (Patient not taking: Reported on 03/09/2020) 1 each 0  . calcitRIOL (ROCALTROL) 0.5 MCG capsule Take 1 capsule (0.5 mcg total) by mouth Every Tuesday,Thursday,and Saturday with dialysis. (Patient not taking: Reported on 03/09/2020) 30 capsule 1  . Nutritional Supplements (FEEDING SUPPLEMENT, NEPRO CARB STEADY,) LIQD Take 237 mLs  by mouth 3 (three) times daily with meals. (Patient not taking: Reported on 03/09/2020) 230 mL 0  . pantoprazole (PROTONIX) 40 MG tablet Take 1 tablet (40 mg total) by mouth daily. 30 tablet 1   No current facility-administered medications on file prior to visit.    There are no Patient Instructions on file for this visit. No follow-ups on file.   Kris Hartmann, NP

## 2020-03-19 ENCOUNTER — Other Ambulatory Visit: Payer: Self-pay

## 2020-03-19 DIAGNOSIS — Z992 Dependence on renal dialysis: Secondary | ICD-10-CM | POA: Insufficient documentation

## 2020-03-19 DIAGNOSIS — N186 End stage renal disease: Secondary | ICD-10-CM | POA: Insufficient documentation

## 2020-03-19 DIAGNOSIS — E039 Hypothyroidism, unspecified: Secondary | ICD-10-CM | POA: Insufficient documentation

## 2020-03-19 DIAGNOSIS — T82590A Other mechanical complication of surgically created arteriovenous fistula, initial encounter: Secondary | ICD-10-CM | POA: Diagnosis present

## 2020-03-19 DIAGNOSIS — Z87891 Personal history of nicotine dependence: Secondary | ICD-10-CM | POA: Insufficient documentation

## 2020-03-19 DIAGNOSIS — E119 Type 2 diabetes mellitus without complications: Secondary | ICD-10-CM | POA: Diagnosis not present

## 2020-03-19 DIAGNOSIS — Z79899 Other long term (current) drug therapy: Secondary | ICD-10-CM | POA: Diagnosis not present

## 2020-03-19 DIAGNOSIS — I120 Hypertensive chronic kidney disease with stage 5 chronic kidney disease or end stage renal disease: Secondary | ICD-10-CM | POA: Diagnosis not present

## 2020-03-19 DIAGNOSIS — Z7982 Long term (current) use of aspirin: Secondary | ICD-10-CM | POA: Insufficient documentation

## 2020-03-19 LAB — CBC WITH DIFFERENTIAL/PLATELET
Abs Immature Granulocytes: 0.04 10*3/uL (ref 0.00–0.07)
Basophils Absolute: 0.1 10*3/uL (ref 0.0–0.1)
Basophils Relative: 1 %
Eosinophils Absolute: 0.2 10*3/uL (ref 0.0–0.5)
Eosinophils Relative: 2 %
HCT: 37.8 % — ABNORMAL LOW (ref 39.0–52.0)
Hemoglobin: 11.9 g/dL — ABNORMAL LOW (ref 13.0–17.0)
Immature Granulocytes: 1 %
Lymphocytes Relative: 20 %
Lymphs Abs: 1.8 10*3/uL (ref 0.7–4.0)
MCH: 30 pg (ref 26.0–34.0)
MCHC: 31.5 g/dL (ref 30.0–36.0)
MCV: 95.2 fL (ref 80.0–100.0)
Monocytes Absolute: 0.9 10*3/uL (ref 0.1–1.0)
Monocytes Relative: 10 %
Neutro Abs: 5.9 10*3/uL (ref 1.7–7.7)
Neutrophils Relative %: 66 %
Platelets: 225 10*3/uL (ref 150–400)
RBC: 3.97 MIL/uL — ABNORMAL LOW (ref 4.22–5.81)
RDW: 15.4 % (ref 11.5–15.5)
WBC: 8.9 10*3/uL (ref 4.0–10.5)
nRBC: 0 % (ref 0.0–0.2)

## 2020-03-19 LAB — COMPREHENSIVE METABOLIC PANEL
ALT: 16 U/L (ref 0–44)
AST: 29 U/L (ref 15–41)
Albumin: 4.1 g/dL (ref 3.5–5.0)
Alkaline Phosphatase: 147 U/L — ABNORMAL HIGH (ref 38–126)
Anion gap: 12 (ref 5–15)
BUN: 30 mg/dL — ABNORMAL HIGH (ref 8–23)
CO2: 32 mmol/L (ref 22–32)
Calcium: 9.2 mg/dL (ref 8.9–10.3)
Chloride: 95 mmol/L — ABNORMAL LOW (ref 98–111)
Creatinine, Ser: 4.42 mg/dL — ABNORMAL HIGH (ref 0.61–1.24)
GFR calc Af Amer: 14 mL/min — ABNORMAL LOW (ref >60)
GFR calc non Af Amer: 12 mL/min — ABNORMAL LOW (ref >60)
Glucose, Bld: 178 mg/dL — ABNORMAL HIGH (ref 70–99)
Potassium: 3.9 mmol/L (ref 3.5–5.1)
Sodium: 139 mmol/L (ref 135–145)
Total Bilirubin: 0.7 mg/dL (ref 0.3–1.2)
Total Protein: 7.8 g/dL (ref 6.5–8.1)

## 2020-03-19 NOTE — ED Notes (Signed)
Pt reports dialysis fistula bleeding through dressing.  Dressing removed and reappied. Pt has bandaids in place, placed gauze and wrapped with kerlex loosely. Pt placed back out in lobby, pt c/o wanting to lay down and asking for blanket and pillow to be put on the floor for him, advised patient he cannot lay on the floor. Pt placed in lobby chair and propped feet up on the wheelchair he was sitting in. Pt given warm blanket. Advised patient I did not know what time he would be see or how long the wait would be.

## 2020-03-19 NOTE — ED Notes (Signed)
Lab notified to add labs on to blood sent with save labels.

## 2020-03-19 NOTE — ED Notes (Addendum)
Pt arrives from [place] for reports of bleeding from dialysis port. PT had dialysis today and the pt took the patch that was placed over the dressing off to take a shower and it started bleeding. PT has dressing to left arm over access area, blood on gauze. VS on arrival 102/45, 95% RA, 72 HR

## 2020-03-20 ENCOUNTER — Emergency Department
Admission: EM | Admit: 2020-03-20 | Discharge: 2020-03-20 | Disposition: A | Discharge status: Home / Self Care | Payer: Medicare Other | Attending: Emergency Medicine | Admitting: Emergency Medicine

## 2020-03-20 DIAGNOSIS — T829XXA Unspecified complication of cardiac and vascular prosthetic device, implant and graft, initial encounter: Secondary | ICD-10-CM

## 2020-03-20 NOTE — Discharge Instructions (Signed)
Remove the brown wrapping at 12PM today. Keep the white dressing on until you go back to dialysis on Tuesday. Return to the ER for bleeding.

## 2020-03-20 NOTE — ED Notes (Signed)
Bleeding seems to be under control at this time, pt asking about leaving and calling EMS to take him home, advised him that EMS will not take him home, pt agreed to stay, VS re-checked as well.

## 2020-03-20 NOTE — ED Notes (Signed)
Pt reports Tuesday, Thursday and Saturday dialysis and on Saturday afternoon after getting home from dialysis he removed the dressing from the access site in his left upper arm so he could take a shower. Area started bleeding from two points. Pt wrapped area and gauze eventually became soaked trough. Guaze removed at this time and only a scant amount of bleeding noted. Dr Alfred Levins in to see patient.

## 2020-03-20 NOTE — ED Provider Notes (Signed)
[facility] Emergency Department Provider Note  ____________________________________________  Time seen: Approximately 3:23 AM  I have reviewed the triage vital signs and the nursing notes.   HISTORY  Chief Complaint Vascular Access Problem   HPI Frank [name] is a 79 y.o. male with a history of ESRD on HD who presents for evaluation of bleeding fistula.  Patient reports coming home from dialysis today.  He remove the dressing went to take a shower.  He noticed that the 2 holes in his fistula from the early access location started bleeding.   Patient applied pressure dressing but was unable to make it stop and EMS was called.  Patient describes it as moderate and constant until EMS applied pressure dressing.  Patient is on Brilinta.  No dizziness, chest pain or shortness of breath.  Past Medical History:  Diagnosis Date  . Anemia of chronic kidney failure   . Arthritis   . Bradycardia   . Cataracts, bilateral 05/15/2012  . Chronic idiopathic gout of multiple sites 02/10/2015  . Chronic kidney disease    [facility]  . Depression   . Diabetes mellitus without complication (HCC)    diet controlled   . Diabetic nephropathy ([place])   . ED (erectile dysfunction) 08/28/2012  . Elevated liver enzymes 06/10/2019  . Epiretinal membrane (ERM) of right eye 06/10/2019   Right eye saw AE 06/05/2019    . Essential hypertension 05/07/2013   Last Assessment & Plan:  Formatting of this note might be different from the original. HTN PLAN   BP goal is <140/90 BP Readings from Last 3 Encounters:  08/27/16 140/79  08/13/16 141/80  08/06/16 144/86   Current Anti-Hyptensive Medications: None  Today's Recommendations: Continue lifestyle modifications. Defer starting medication to PCP.  Recommend continued regular exercise as tolerated. Recomm  . History of UTI    s/p foley in hospital 01/2019   . Hyperlipidemia   . Hypothyroidism   . Illiterate 11/19/2013   Last Assessment &  Plan:  Assisted patient with Handicapped Placard application today.    . Insomnia 05/08/2019  . Neuropathy   . Peripheral neuropathy   . Peripheral vascular disease ([place])   . Second degree heart block   . Secondary hyperparathyroidism ([place])    Renal  . Thyroid disease     Patient Active Problem List   Diagnosis Date Noted  . Diarrhea, unspecified 01/26/2020  . Allergic rhinitis, unspecified 12/02/2019  . Fluid overload, unspecified 12/02/2019  . Atrioventricular block, Mobitz type 1, Wenckebach   . Left-sided weakness   . 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 Stenosis of right carotid artery   . Arterial hypotension   . Cerebral embolism with cerebral infarction 10/08/2019  . Slurred speech 10/07/2019  . Epiretinal membrane (ERM) of right eye 06/10/2019  . Elevated liver enzymes 06/10/2019  . Benign prostatic hyperplasia 05/08/2019  . Insomnia 05/08/2019  . Coronary artery disease involving native coronary artery of native heart without angina pectoris 05/08/2019  . Atherosclerosis 05/08/2019  . Hyperphosphatemia 05/08/2019  . Secondary hyperparathyroidism ([place])   . Hyperlipidemia   . Gout   . Anemia of chronic kidney failure   . Near syncope 01/21/2019  . Elevated troponin 01/21/2019  . Osteoarthritis of midfoot, right 08/13/2016  . Foot swelling 07/04/2016  . DM type 2 with diabetic peripheral neuropathy ([place]) 11/28/2015  . Pre-transplant evaluation for kidney transplant 11/28/2015  . HLD (hyperlipidemia) 08/01/2015  . Combined arterial  insufficiency and corporo-venous occlusive erectile dysfunction 02/10/2015  . Chronic idiopathic gout of multiple sites 02/10/2015  . Illiterate 11/19/2013  . ESRD (end stage renal disease) ([place]) 05/07/2013  . Essential hypertension 05/07/2013  . Osteoarthritis 05/07/2013  . Diastasis recti 10/29/2012  . 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 ED  (erectile dysfunction) 08/28/2012  . Cataracts, bilateral 05/15/2012  . Chronic pain 05/15/2012  . DM (diabetes mellitus) ([place]) 05/15/2012  . Gout of knee 05/15/2012  . Hyperkalemia 05/15/2012  . Hypothyroidism 05/15/2012    Past Surgical History:  Procedure Laterality Date  . AV FISTULA PLACEMENT Left 11/19/2017   Procedure: ARTERIOVENOUS (AV) FISTULA CREATION;  Surgeon: [name], MD;  Location: [place];  Service: Vascular;  Laterality: Left;  . BASCILIC VEIN TRANSPOSITION Left 06/04/2018   Procedure: BASILIC VEIN TRANSPOSITION ARM;  Surgeon: [name], MD;  Location: [place];  Service: Vascular;  Laterality: Left;  . COLONOSCOPY    . DIALYSIS/PERMA CATHETER REMOVAL N/A 03/23/2019   Procedure: DIALYSIS/PERMA CATHETER REMOVAL;  Surgeon: [name], MD;  Location: [place] [facility];  Service: Cardiovascular;  Laterality: N/A;  . EYE SURGERY     cataract surgery b/l; photophobia since   . INSERTION OF DIALYSIS CATHETER N/A 01/23/2019   Procedure: INSERTION OF DIALYSIS CATHETER;  Surgeon: [name], MD;  Location: [facility] OR;  Service: Vascular;  Laterality: N/A;  INSERTION OF DIALYSIS CATHETER  . IR ANGIO INTRA EXTRACRAN SEL COM CAROTID INNOMINATE UNI L MOD SED  10/15/2019  . IR ANGIO VERTEBRAL SEL SUBCLAVIAN INNOMINATE UNI R MOD SED  10/15/2019  . IR CT HEAD LTD  10/15/2019  . IR FLUORO GUIDE CV LINE RIGHT  10/08/2019  . IR INTRAVSC STENT CERV CAROTID W/EMB-PROT MOD SED INCL ANGIO  10/15/2019  . IR THROMBECTOMY AV FISTULA W/THROMBOLYSIS/PTA INC/SHUNT/IMG LEFT Left 10/09/2019  . IR US GUIDE VASC ACCESS LEFT  10/09/2019  . IR US GUIDE VASC ACCESS RIGHT  10/08/2019  . KNEE ARTHROSCOPY Left    Knee  . PERIPHERAL VASCULAR THROMBECTOMY Left 07/06/2019   Procedure: PERIPHERAL VASCULAR THROMBECTOMY;  Surgeon: [name], MD;  Location: [place] [facility];  Service: Cardiovascular;  Laterality: Left;  . RADIOLOGY WITH ANESTHESIA N/A 10/15/2019   Procedure: stent  placement;  Surgeon: Luanne Bras, MD;  Location: [place];  Service: Radiology;  Laterality: N/A;  . THROMBECTOMY W/ EMBOLECTOMY Left 01/27/2019   Procedure: THROMBECTOMY Left arm ARTERIOVENOUS FISTULA  and Left arm Arteriovenous gortex graft;  Surgeon: [name], MD;  Location: [place];  Service: Vascular;  Laterality: Left;  Marland Kitchen VENOGRAM Left 01/27/2019   Procedure: Left arm FISTULOGRAM/;  Surgeon: [name], MD;  Location: [facility] OR;  Service: Vascular;  Laterality: Left;    Prior to Admission medications   Medication Sig Start Date End Date Taking? Authorizing Provider  acetaminophen (TYLENOL) 325 MG tablet Take 325 mg by mouth every 6 (six) hours as needed.    [provider]  allopurinol (ZYLOPRIM) 100 MG tablet Take 1 tablet (100 mg total) by mouth daily. 06/03/19   McLean-Scocuzza, Nino Glow, MD  ALPRAZolam Duanne Moron) 0.25 MG tablet  01/11/20   [provider]  alum & mag hydroxide-simeth (MAALOX/MYLANTA) 200-200-20 MG/5ML suspension Take 5 mLs by mouth every 6 (six) hours as needed for indigestion or heartburn.    [provider]  Amino Acids-Protein Hydrolys (FEEDING SUPPLEMENT, PRO-STAT SUGAR FREE 64,) LIQD Take 30 mLs by mouth daily. Patient  not taking: Reported on 03/09/2020    [provider]  aspirin EC 81 MG tablet Take 81 mg daily by mouth.    [provider]  atorvastatin (LIPITOR) 20 MG tablet Take 20 mg by mouth daily at 6 PM.     [provider]  benzonatate (TESSALON) 200 MG capsule Take 200 mg by mouth 3 (three) times daily as needed for cough.    [provider]  bisacodyl (DULCOLAX) 10 MG suppository Place 10 mg rectally as needed for moderate constipation.    [provider]  bismuth subsalicylate (PEPTO BISMOL) 262 MG/15ML suspension Take 15 mLs by mouth every 6 (six) hours as needed.    [provider]  blood glucose meter kit and supplies Dispense based on patient and insurance  preference. Use up to two times daily as directed. (FOR ICD-10 E10.9, E11.9). STRIPS and LANCETS 1 year supply Patient not taking: Reported on 03/09/2020 12/21/19   McLean-Scocuzza, Nino Glow, MD  calcitRIOL (ROCALTROL) 0.5 MCG capsule Take 1 capsule (0.5 mcg total) by mouth Every Tuesday,Thursday,and Saturday with dialysis. Patient not taking: Reported on 03/09/2020 10/24/19   Regalado, Jerald Kief A, MD  clotrimazole (LOTRIMIN) 1 % cream Apply 1 application topically daily as needed (For yeast, groin rash).    [provider]  diphenhydrAMINE HCl (DIPHENHYDRAMINE COUGH PO) Take by mouth. One a day as needed for itching or insomnia 12/03/19 11/29/20  [provider]  docusate sodium (COLACE) 100 MG capsule Take 200 mg by mouth daily as needed.     [provider]  donepezil (ARICEPT) 5 MG tablet Take 1 tablet (5 mg total) by mouth at bedtime. 12/23/19   Melvenia Beam, MD  guaifenesin (ROBITUSSIN) 100 MG/5ML syrup Take 100 mg by mouth 3 (three) times daily as needed for cough.    [provider]  ketoconazole (NIZORAL) 2 % shampoo Apply 1 application topically once. On Monday and Thursday 10/24/19   [provider]  levothyroxine (SYNTHROID) 175 MCG tablet Take 1 tablet (175 mcg total) by mouth daily before breakfast. 10/24/19   Regalado, Belkys A, MD  lidocaine-prilocaine (EMLA) cream Apply 1 application topically See admin instructions. Apply to access site 1 to 2 hours before dialysis. Cover with occlusive dressing.On Tuesday,Thursday and Saturday 04/08/19   [provider]  loperamide (IMODIUM) 2 MG capsule Take 2 mg by mouth as needed for diarrhea or loose stools.    [provider]  magnesium hydroxide (MILK OF MAGNESIA) 400 MG/5ML suspension Take 5 mLs by mouth daily as needed for mild constipation. 30cc 2 tablespoons full  By mounth as needed for one dose    [provider]  Melatonin 3 MG CAPS Take by mouth. One tab po at bed time     [provider]  midodrine (PROAMATINE) 10 MG tablet Take 1 tablet (10 mg total) by mouth 3 (three) times daily with meals. Patient taking differently: Take 10 mg by mouth See admin instructions. With meals. Administer 1/2 hour before dialysis on Tuesday, Thursday, Saturday. 12/14/19   Shirley Friar, PA-C  Multiple Vitamin (MULTIVITAMIN WITH MINERALS) TABS tablet Take 1 tablet by mouth daily.    [provider]  Nutritional Supplements (FEEDING SUPPLEMENT, NEPRO CARB STEADY,) LIQD Take 237 mLs by mouth 3 (three) times daily with meals. Patient not taking: Reported on 03/09/2020 10/23/19   Regalado, Jerald Kief A, MD  pantoprazole (PROTONIX) 40 MG tablet Take 1 tablet (40 mg total) by mouth daily. 10/23/19 12/22/19  Regalado,  Belkys A, MD  polyethylene glycol (MIRALAX / GLYCOLAX) 17 g packet Take 17 g by mouth daily. 10/23/19   Regalado, Belkys A, MD  senna (SENOKOT) 8.6 MG TABS tablet Take 1 tablet by mouth.    [provider]  sevelamer carbonate (RENVELA) 800 MG tablet Take 3 tablets (2,400 mg total) by mouth 3 (three) times daily with meals. 10/23/19   Regalado, Belkys A, MD  sodium phosphate Pediatric (FLEET) 3.5-9.5 GM/59ML enema Place 1 enema rectally once.    [provider]  tamsulosin (FLOMAX) 0.4 MG CAPS capsule Take 1 capsule (0.4 mg total) by mouth daily. Patient taking differently: Take 0.8 mg by mouth daily.  10/23/19   Regalado, Belkys A, MD  ticagrelor (BRILINTA) 90 MG TABS tablet Take 1 tablet (90 mg total) by mouth 2 (two) times daily. 10/23/19   Regalado, Belkys A, MD  traZODone (DESYREL) 50 MG tablet Take 0.5 tablets (25 mg total) by mouth at bedtime as needed for sleep. 12/28/19   McLean-Scocuzza, Nino Glow, MD  Zinc Oxide (BALMEX EX) Apply 1 application topically daily as needed (to irritated areas of skin).     [provider]    Allergies Ace inhibitors, Angiotensin receptor blockers, Ivp dye [iodinated diagnostic agents], and Adhesive  [tape]  Family History  Problem Relation Age of Onset  . Diabetes Mother   . Kidney disease Mother   . Liver disease Mother   . Heart Problems Mother   . Stroke Neg Hx     Social History Social History   Tobacco Use  . Smoking status: Former Smoker    Years: 30.00    Quit date: 1994    Years since quitting: 27.5  . Smokeless tobacco: Never Used  Vaping Use  . Vaping Use: Never used  Substance Use Topics  . Alcohol use: No  . Drug use: No    Review of Systems  Constitutional: Negative for fever. Eyes: Negative for visual changes. ENT: Negative for sore throat. Neck: No neck pain  Cardiovascular: Negative for chest pain. Respiratory: Negative for shortness of breath. Gastrointestinal: Negative for abdominal pain, vomiting or diarrhea. Genitourinary: Negative for dysuria. Musculoskeletal: Negative for back pain. Skin: Negative for rash. Neurological: Negative for headaches, weakness or numbness. Psych: No SI or HI  ____________________________________________   PHYSICAL EXAM:  VITAL SIGNS: Vitals:   03/19/20 2209 03/20/20 0109  BP: (!) 96/46 (!) 104/49  Pulse: 72 72  Resp: 18 18  Temp:    SpO2: 98% 96%   Constitutional: Alert and oriented. Well appearing and in no apparent distress. HEENT:      Head: Normocephalic and atraumatic.         Eyes: Conjunctivae are normal. Sclera is non-icteric.       Mouth/Throat: Mucous membranes are moist.       Neck: Supple with no signs of meningismus. Cardiovascular: Regular rate and rhythm. No murmurs, gallops, or rubs.  Respiratory: Normal respiratory effort. Lungs are clear to auscultation bilaterally.  Musculoskeletal: Minimal amount of oozing from one the access holes on the L Av fistula without significant bleeding Neurologic: Normal speech and language. Face is symmetric. Moving all extremities. No gross focal neurologic deficits are appreciated. Skin: Skin is warm, dry and intact. No rash noted. Psychiatric:  Mood and affect are normal. Speech and behavior are normal.  ____________________________________________   LABS (all labs ordered are listed, but only abnormal results are displayed)  Labs Reviewed  CBC WITH DIFFERENTIAL/PLATELET - Abnormal; Notable for the following components:  Result Value   RBC 3.97 (*)    Hemoglobin 11.9 (*)    HCT 37.8 (*)    All other components within normal limits  COMPREHENSIVE METABOLIC PANEL - Abnormal; Notable for the following components:   Chloride 95 (*)    Glucose, Bld 178 (*)    BUN 30 (*)    Creatinine, Ser 4.42 (*)    Alkaline Phosphatase 147 (*)    GFR calc non Af Amer 12 (*)    GFR calc Af Amer 14 (*)    All other components within normal limits  PROTIME-INR  APTT   ____________________________________________  EKG  none  ____________________________________________  RADIOLOGY  none  ____________________________________________   PROCEDURES  Procedure(s) performed: None Procedures Critical Care performed:  None ____________________________________________   INITIAL IMPRESSION / ASSESSMENT AND PLAN / ED COURSE   79 y.o. male with a history of ESRD on HD who presents for evaluation of bleeding fistula.  At this time bleeding is mostly controlled.  There is a very small amount of oozing from one of the previous access holes in the left AV fistula.  Quick clot gauze with pressure dressing was applied.  Recommended keeping the pressure dressing for 12 hours and then removing it.  Blood work with no signs of acute blood loss anemia.  Patient hemodynamically stable.  At this time stable for discharge home.  Discussed my return precautions for recurrent bleeding.  Old medical records reviewed.      _____________________________________________ Please note:  Patient was evaluated in Emergency Department today for the symptoms described in the history of present illness. Patient was evaluated in the context of the global  COVID-19 pandemic, which necessitated consideration that the patient might be at risk for infection with the SARS-CoV-2 virus that causes COVID-19. Institutional protocols and algorithms that pertain to the evaluation of patients at risk for COVID-19 are in a state of rapid change based on information released by regulatory bodies including the CDC and federal and state organizations. These policies and algorithms were followed during the patient's care in the ED.  Some ED evaluations and interventions may be delayed as a result of limited staffing during the pandemic.   [place] Controlled Substance Database was reviewed by me. ____________________________________________   FINAL CLINICAL IMPRESSION(S) / ED DIAGNOSES   Final diagnoses:  Complication of arteriovenous dialysis fistula, initial encounter      NEW MEDICATIONS STARTED DURING THIS VISIT:  ED Discharge Orders    None       Note:  This document was prepared using Dragon voice recognition software and may include unintentional dictation errors.    Alfred Levins, [place], MD 03/20/20 0330

## 2020-05-03 ENCOUNTER — Telehealth (HOSPITAL_COMMUNITY): Payer: Self-pay

## 2020-05-03 NOTE — Telephone Encounter (Signed)
Called to schedule us carotid, no answer, left vm. AW  

## 2020-05-09 ENCOUNTER — Other Ambulatory Visit (HOSPITAL_COMMUNITY): Payer: Self-pay | Admitting: Interventional Radiology

## 2020-05-09 DIAGNOSIS — I6521 Occlusion and stenosis of right carotid artery: Secondary | ICD-10-CM

## 2020-05-24 NOTE — Progress Notes (Signed)
Electrophysiology Office Note Date: 05/25/2020  ID:  Frank [name], DOB Mar 15, 1941, MRN [id number]  PCP: Bonnita Nasuti, MD Electrophysiologist: Rayann Heman  CC: routine follow up  Frank [name] is a 79 y.o. male seen today for Dr Rayann Heman.  He presents today for routine electrophysiology followup.  Since last being seen in our clinic, the patient reports doing relatively well.  He lives in assisted living and remains fairly active.  He has had some shortness of breath at night that resolves with sitting up.  He continues with HD.  He denies chest pain, palpitations, PND, nausea, vomiting, dizziness, syncope, edema, weight gain, or early satiety.  Past Medical History:  Diagnosis Date  . Anemia of chronic kidney failure   . Arthritis   . Bradycardia   . Cataracts, bilateral 05/15/2012  . Chronic idiopathic gout of multiple sites 02/10/2015  . Chronic kidney disease    [facility]  . Depression   . Diabetes mellitus without complication (HCC)    diet controlled   . Diabetic nephropathy ([place])   . ED (erectile dysfunction) 08/28/2012  . Elevated liver enzymes 06/10/2019  . Epiretinal membrane (ERM) of right eye 06/10/2019   Right eye saw AE 06/05/2019    . Essential hypertension 05/07/2013   Last Assessment & Plan:  Formatting of this note might be different from the original. HTN PLAN   BP goal is <140/90 BP Readings from Last 3 Encounters:  08/27/16 140/79  08/13/16 141/80  08/06/16 144/86   Current Anti-Hyptensive Medications: None  Today's Recommendations: Continue lifestyle modifications. Defer starting medication to PCP.  Recommend continued regular exercise as tolerated. Recomm  . History of UTI    s/p foley in hospital 01/2019   . Hyperlipidemia   . Hypothyroidism   . Illiterate 11/19/2013   Last Assessment & Plan:  Assisted patient with Handicapped Placard application today.    . Insomnia 05/08/2019  . Neuropathy   . Peripheral neuropathy   . Peripheral vascular disease ([place])    . Second degree heart block   . Secondary hyperparathyroidism ([place])    Renal  . Thyroid disease    Past Surgical History:  Procedure Laterality Date  . AV FISTULA PLACEMENT Left 11/19/2017   Procedure: ARTERIOVENOUS (AV) FISTULA CREATION;  Surgeon: [name], MD;  Location: [facility];  Service: Vascular;  Laterality: Left;  . BASCILIC VEIN TRANSPOSITION Left 06/04/2018   Procedure: BASILIC VEIN TRANSPOSITION ARM;  Surgeon: [name], MD;  Location: [facility];  Service: Vascular;  Laterality: Left;  . COLONOSCOPY    . DIALYSIS/PERMA CATHETER REMOVAL N/A 03/23/2019   Procedure: DIALYSIS/PERMA CATHETER REMOVAL;  Surgeon: [name], MD;  Location: [facility];  Service: Cardiovascular;  Laterality: N/A;  . EYE SURGERY     cataract surgery b/l; photophobia since   . INSERTION OF DIALYSIS CATHETER N/A 01/23/2019   Procedure: INSERTION OF DIALYSIS CATHETER;  Surgeon: [name], MD;  Location: [facility];  Service: Vascular;  Laterality: N/A;  INSERTION OF DIALYSIS CATHETER  . IR ANGIO INTRA EXTRACRAN SEL COM CAROTID INNOMINATE UNI L MOD SED  10/15/2019  . IR ANGIO VERTEBRAL SEL SUBCLAVIAN INNOMINATE UNI R MOD SED  10/15/2019  . IR CT HEAD LTD  10/15/2019  . IR FLUORO GUIDE CV LINE RIGHT  10/08/2019  . IR INTRAVSC STENT CERV CAROTID W/EMB-PROT MOD SED INCL ANGIO  10/15/2019  . IR THROMBECTOMY AV FISTULA W/THROMBOLYSIS/PTA INC/SHUNT/IMG LEFT Left 10/09/2019  . IR US  GUIDE VASC ACCESS LEFT  10/09/2019  . IR US GUIDE VASC ACCESS RIGHT  10/08/2019  . KNEE ARTHROSCOPY Left    Knee  . PERIPHERAL VASCULAR THROMBECTOMY Left 07/06/2019   Procedure: PERIPHERAL VASCULAR THROMBECTOMY;  Surgeon: [name], MD;  Location: [facility];  Service: Cardiovascular;  Laterality: Left;  . RADIOLOGY WITH ANESTHESIA N/A 10/15/2019   Procedure: stent placement;  Surgeon: Luanne Bras, MD;  Location: [facility];  Service: Radiology;  Laterality: N/A;  . THROMBECTOMY W/ EMBOLECTOMY  Left 01/27/2019   Procedure: THROMBECTOMY Left arm ARTERIOVENOUS FISTULA  and Left arm Arteriovenous gortex graft;  Surgeon: [name], MD;  Location: [facility];  Service: Vascular;  Laterality: Left;  Marland Kitchen VENOGRAM Left 01/27/2019   Procedure: Left arm FISTULOGRAM/;  Surgeon: [name], MD;  Location: [facility];  Service: Vascular;  Laterality: Left;    Current Outpatient Medications  Medication Sig Dispense Refill  . acetaminophen (TYLENOL) 325 MG tablet Take 325 mg by mouth every 6 (six) hours as needed.    Marland Kitchen allopurinol (ZYLOPRIM) 100 MG tablet Take 1 tablet (100 mg total) by mouth daily. 90 tablet 3  . ALPRAZolam (XANAX) 0.25 MG tablet     . alum & mag hydroxide-simeth (MAALOX/MYLANTA) 200-200-20 MG/5ML suspension Take 5 mLs by mouth every 6 (six) hours as needed for indigestion or heartburn.    . Amino Acids-Protein Hydrolys (FEEDING SUPPLEMENT, PRO-STAT SUGAR FREE 64,) LIQD Take 30 mLs by mouth daily.     Marland Kitchen aspirin EC 81 MG tablet Take 81 mg daily by mouth.    Marland Kitchen atorvastatin (LIPITOR) 20 MG tablet Take 20 mg by mouth daily at 6 PM.     . benzonatate (TESSALON) 200 MG capsule Take 200 mg by mouth 3 (three) times daily as needed for cough.    . bisacodyl (DULCOLAX) 10 MG suppository Place 10 mg rectally as needed for moderate constipation.    . bismuth subsalicylate (PEPTO BISMOL) 262 MG/15ML suspension Take 15 mLs by mouth every 6 (six) hours as needed.    . blood glucose meter kit and supplies Dispense based on patient and insurance preference. Use up to two times daily as directed. (FOR ICD-10 E10.9, E11.9). STRIPS and LANCETS 1 year supply 1 each 0  . calcitRIOL (ROCALTROL) 0.5 MCG capsule Take 1 capsule (0.5 mcg total) by mouth Every Tuesday,Thursday,and Saturday with dialysis. 30 capsule 1  . clotrimazole (LOTRIMIN) 1 % cream Apply 1 application topically daily as needed (For yeast, groin rash).    . diphenhydrAMINE HCl (DIPHENHYDRAMINE COUGH PO) Take by mouth. One a day as needed  for itching or insomnia    . docusate sodium (COLACE) 100 MG capsule Take 200 mg by mouth daily as needed.     . donepezil (ARICEPT) 5 MG tablet Take 1 tablet (5 mg total) by mouth at bedtime. 30 tablet 6  . guaifenesin (ROBITUSSIN) 100 MG/5ML syrup Take 100 mg by mouth 3 (three) times daily as needed for cough.    Marland Kitchen ketoconazole (NIZORAL) 2 % shampoo Apply 1 application topically once. On Monday and Thursday    . levothyroxine (SYNTHROID) 175 MCG tablet Take 1 tablet (175 mcg total) by mouth daily before breakfast. 30 tablet 0  . lidocaine-prilocaine (EMLA) cream Apply 1 application topically See admin instructions. Apply to access site 1 to 2 hours before dialysis. Cover with occlusive dressing.On Tuesday,Thursday and Saturday    . loperamide (IMODIUM) 2 MG capsule Take 2 mg by mouth as needed for  diarrhea or loose stools.    . magnesium hydroxide (MILK OF MAGNESIA) 400 MG/5ML suspension Take 5 mLs by mouth daily as needed for mild constipation. 30cc 2 tablespoons full  By mounth as needed for one dose    . Melatonin 3 MG CAPS Take by mouth. One tab po at bed time    . midodrine (PROAMATINE) 10 MG tablet Take 1 tablet (10 mg total) by mouth 3 (three) times daily with meals. (Patient taking differently: Take 10 mg by mouth See admin instructions. With meals. Administer 1/2 hour before dialysis on Tuesday, Thursday, Saturday.) 270 tablet 3  . Multiple Vitamin (MULTIVITAMIN WITH MINERALS) TABS tablet Take 1 tablet by mouth daily.    . Nutritional Supplements (FEEDING SUPPLEMENT, NEPRO CARB STEADY,) LIQD Take 237 mLs by mouth 3 (three) times daily with meals. 230 mL 0  . pantoprazole (PROTONIX) 40 MG tablet Take 1 tablet (40 mg total) by mouth daily. 30 tablet 1  . polyethylene glycol (MIRALAX / GLYCOLAX) 17 g packet Take 17 g by mouth daily. 14 each 0  . senna (SENOKOT) 8.6 MG TABS tablet Take 1 tablet by mouth.    . sevelamer carbonate (RENVELA) 800 MG tablet Take 3 tablets (2,400 mg total) by mouth  3 (three) times daily with meals. 120 tablet 0  . sodium phosphate Pediatric (FLEET) 3.5-9.5 GM/59ML enema Place 1 enema rectally once.    . tamsulosin (FLOMAX) 0.4 MG CAPS capsule Take 1 capsule (0.4 mg total) by mouth daily. (Patient taking differently: Take 0.8 mg by mouth daily. ) 30 capsule 1  . ticagrelor (BRILINTA) 90 MG TABS tablet Take 1 tablet (90 mg total) by mouth 2 (two) times daily. 60 tablet 3  . traZODone (DESYREL) 50 MG tablet Take 0.5 tablets (25 mg total) by mouth at bedtime as needed for sleep. 90 tablet 3  . Zinc Oxide (BALMEX EX) Apply 1 application topically daily as needed (to irritated areas of skin).      No current facility-administered medications for this visit.    Allergies:   Ace inhibitors, Angiotensin receptor blockers, Ivp dye [iodinated diagnostic agents], and Adhesive [tape]   Social History: Social History   Socioeconomic History  . Marital status: Divorced    Spouse name: Not on file  . Number of children: 4  . Years of education: Not on file  . Highest education level: 8th grade  Occupational History  . Not on file  Tobacco Use  . Smoking status: Former Smoker    Years: 30.00    Quit date: 1994    Years since quitting: 27.7  . Smokeless tobacco: Never Used  Vaping Use  . Vaping Use: Never used  Substance and Sexual Activity  . Alcohol use: No  . Drug use: No  . Sexual activity: Not Currently  Other Topics Concern  . Not on file  Social History Narrative   4 kids (3 daughters and 1 son)   Daughter [name] DPR      Lives at [facility]    Social Determinants of Health   Financial Resource Strain: Low Risk   . Difficulty of Paying Living Expenses: Not hard at all  Food Insecurity: No Food Insecurity  . Worried About Charity fundraiser in the Last Year: Never true  . Ran Out of Food in the Last Year: Never true  Transportation Needs: No Transportation Needs  . Lack of Transportation (Medical): No  . Lack of  Transportation (Non-Medical): No  Physical  Activity:   . Days of Exercise per Week: Not on file  . Minutes of Exercise per Session: Not on file  Stress: No Stress Concern Present  . Feeling of Stress : Only a little  Social Connections: Unknown  . Frequency of Communication with Friends and Family: More than three times a week  . Frequency of Social Gatherings with Friends and Family: Not on file  . Attends Religious Services: Not on file  . Active Member of Clubs or Organizations: Not on file  . Attends Archivist Meetings: Not on file  . Marital Status: Divorced  Human resources officer Violence: Not At Risk  . Fear of Current or Ex-Partner: No  . Emotionally Abused: No  . Physically Abused: No  . Sexually Abused: No    Family History: Family History  Problem Relation Age of Onset  . Diabetes Mother   . Kidney disease Mother   . Liver disease Mother   . Heart Problems Mother   . Stroke Neg Hx     Review of Systems: All other systems reviewed and are otherwise negative except as noted above.   Physical Exam: VS:  BP (!) 118/58   Pulse 82   Ht _0  (1.702 m)   Wt 207 lb 9.6 oz (94.2 kg)   SpO2 92%   BMI 32.51 kg/m  , BMI Body mass index is 32.51 kg/m. Wt Readings from Last 3 Encounters:  05/25/20 207 lb 9.6 oz (94.2 kg)  03/19/20 200 lb (90.7 kg)  03/09/20 203 lb (92.1 kg)    GEN- The patient is well appearing, alert and oriented x 3 today.   HEENT: normocephalic, atraumatic; sclera clear, conjunctiva pink; hearing intact; oropharynx clear; neck supple, no JVP Lymph- no cervical lymphadenopathy Lungs- Clear to ausculation bilaterally, normal work of breathing.  No wheezes, rales, rhonchi Heart- Regular rate and rhythm, no murmurs, rubs or gallops, PMI not laterally displaced GI- soft, non-tender, non-distended, bowel sounds present, no hepatosplenomegaly Extremities- no clubbing, cyanosis, or edema; DP/PT/radial pulses 2+ bilaterally MS- no significant  deformity or atrophy Skin- warm and dry, no rash or lesion  Psych- euthymic mood, full affect Neuro- strength and sensation are intact   EKG:  EKG is ordered today. The ekg ordered today shows sinus rhythm, rate 85, marked 1st degree AV block   Recent Labs: 10/07/2019: TSH 1.010 12/14/2019: Magnesium 2.3 03/19/2020: ALT 16; BUN 30; Creatinine, Ser 4.42; Hemoglobin 11.9; Platelets 225; Potassium 3.9; Sodium 139    Other studies Reviewed: Additional studies/ records that were reviewed today include: office notes  Assessment and Plan:  1.  Conduction system disease EKG stable  No symptomatic bradycardia No current indication for pacemaker implant. He would be a poor candidate for invasive procedures with increased infection risk  2.  Hypotension during HD Continue Midodrine  3.  Urinary retention Followed by urology   4.  ESRD on HD He has had some orthopnea Echo from January reviewed with normal LVEF Encouraged low Na+ diet, but he also has orthostatic intolerance.  Will follow for now   Current medicines are reviewed at length with the patient today.   The patient does not have concerns regarding his medicines.  The following changes were made today:  none  Labs/ tests ordered today include: none Orders Placed This Encounter  Procedures  . EKG 12-Lead     Disposition:   Follow up with Dr Rayann Heman 1 year      Signed, Chanetta Marshall, NP 05/25/2020 9:46 AM  [address] [place] [phone] (office) [phone] (fax)

## 2020-05-25 ENCOUNTER — Other Ambulatory Visit: Payer: Self-pay

## 2020-05-25 ENCOUNTER — Ambulatory Visit (INDEPENDENT_AMBULATORY_CARE_PROVIDER_SITE_OTHER): Payer: Medicare Other | Admitting: Nurse Practitioner

## 2020-05-25 ENCOUNTER — Encounter: Payer: Self-pay | Admitting: Nurse Practitioner

## 2020-05-25 VITALS — BP 118/58 | HR 82 | Ht 67.0 in | Wt 207.6 lb

## 2020-05-25 DIAGNOSIS — R339 Retention of urine, unspecified: Secondary | ICD-10-CM

## 2020-05-25 DIAGNOSIS — N186 End stage renal disease: Secondary | ICD-10-CM | POA: Diagnosis not present

## 2020-05-25 DIAGNOSIS — I6521 Occlusion and stenosis of right carotid artery: Secondary | ICD-10-CM | POA: Diagnosis not present

## 2020-05-25 DIAGNOSIS — I959 Hypotension, unspecified: Secondary | ICD-10-CM

## 2020-05-25 DIAGNOSIS — I440 Atrioventricular block, first degree: Secondary | ICD-10-CM | POA: Diagnosis not present

## 2020-05-25 NOTE — Patient Instructions (Signed)
Medication Instructions:  No changes *If you need a refill on your cardiac medications before your next appointment, please call your pharmacy*   Lab Work: none If you have labs (blood work) drawn today and your tests are completely normal, you will receive your results only by: Marland Kitchen MyChart Message (if you have MyChart) OR . A paper copy in the mail If you have any lab test that is abnormal or we need to change your treatment, we will call you to review the results.   Testing/Procedures: none   Follow-Up: At [facility], you and your health needs are our priority.  As part of our continuing mission to provide you with exceptional heart care, we have created designated Provider Care Teams.  These Care Teams include your primary Cardiologist (physician) and Advanced Practice Providers (APPs -  Physician Assistants and Nurse Practitioners) who all work together to provide you with the care you need, when you need it.  We recommend signing up for the patient portal called "MyChart".  Sign up information is provided on this After Visit Summary.  MyChart is used to connect with patients for Virtual Visits (Telemedicine).  Patients are able to view lab/test results, encounter notes, upcoming appointments, etc.  Non-urgent messages can be sent to your provider as well.   To learn more about what you can do with MyChart, go to [email or web address].    Your next appointment:   12 month(s)  The format for your next appointment:   In Person  Provider:   You may see Thompson Grayer, MD or one of the following Advanced Practice Providers on your designated Care Team:    Chanetta Marshall, NP  Tommye Standard, PA-C  Legrand Como "Oda [place], [place]    Other Instructions

## 2020-06-18 IMAGING — DX PORTABLE CHEST - 1 VIEW
1 series · 1 of 1 positions shown · non-contrast
Comparison: None.

CLINICAL DATA: Generalized weakness

EXAM:
PORTABLE CHEST 1 VIEW

[chest ap]
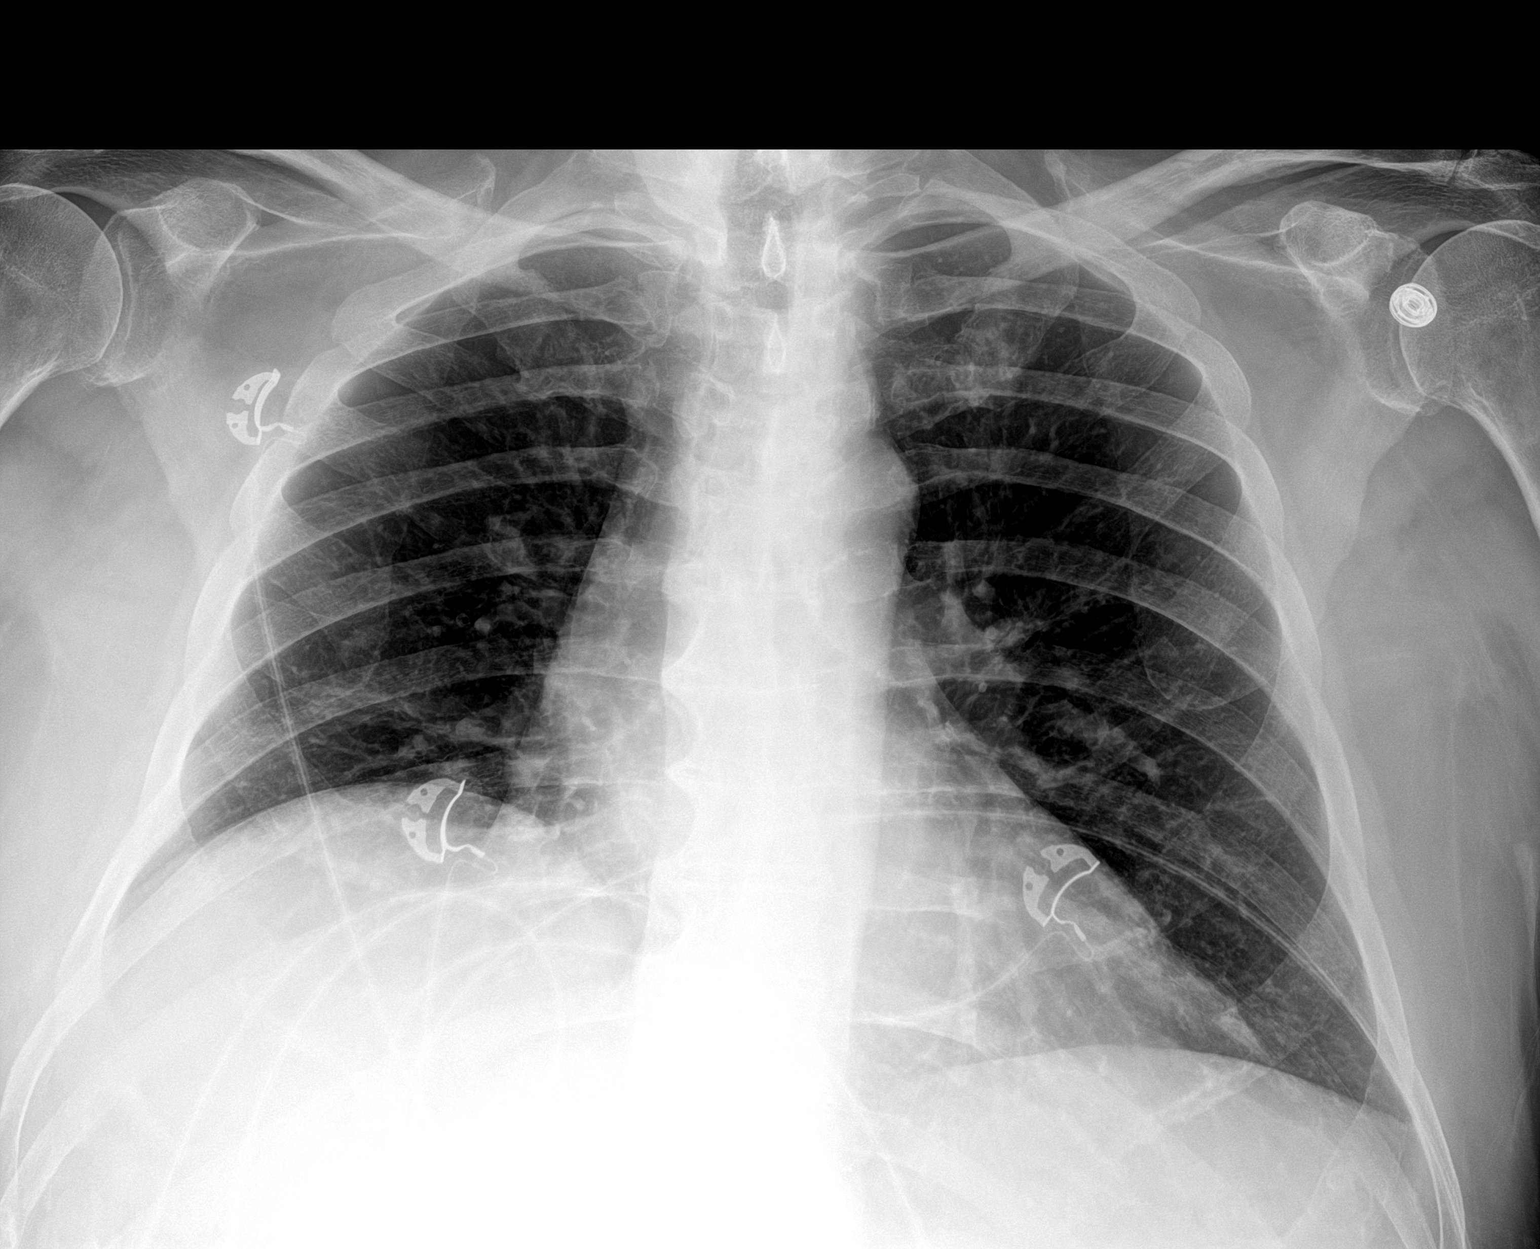

[1 of 1 positions shown; findings below may reference images not displayed]

FINDINGS: Elevated right diaphragm with mild right lower lobe atelectasis.
Lungs otherwise clear. Negative for heart failure.
IMPRESSION: Mild right lower lobe atelectasis.

## 2020-06-20 IMAGING — DX CHEST  1 VIEW
1 series · 1 of 1 positions shown · non-contrast
Comparison: Chest x-ray dated January 21, 2019.

CLINICAL DATA: New right-sided dialysis catheter.

EXAM:
CHEST  1 VIEW

[chest]
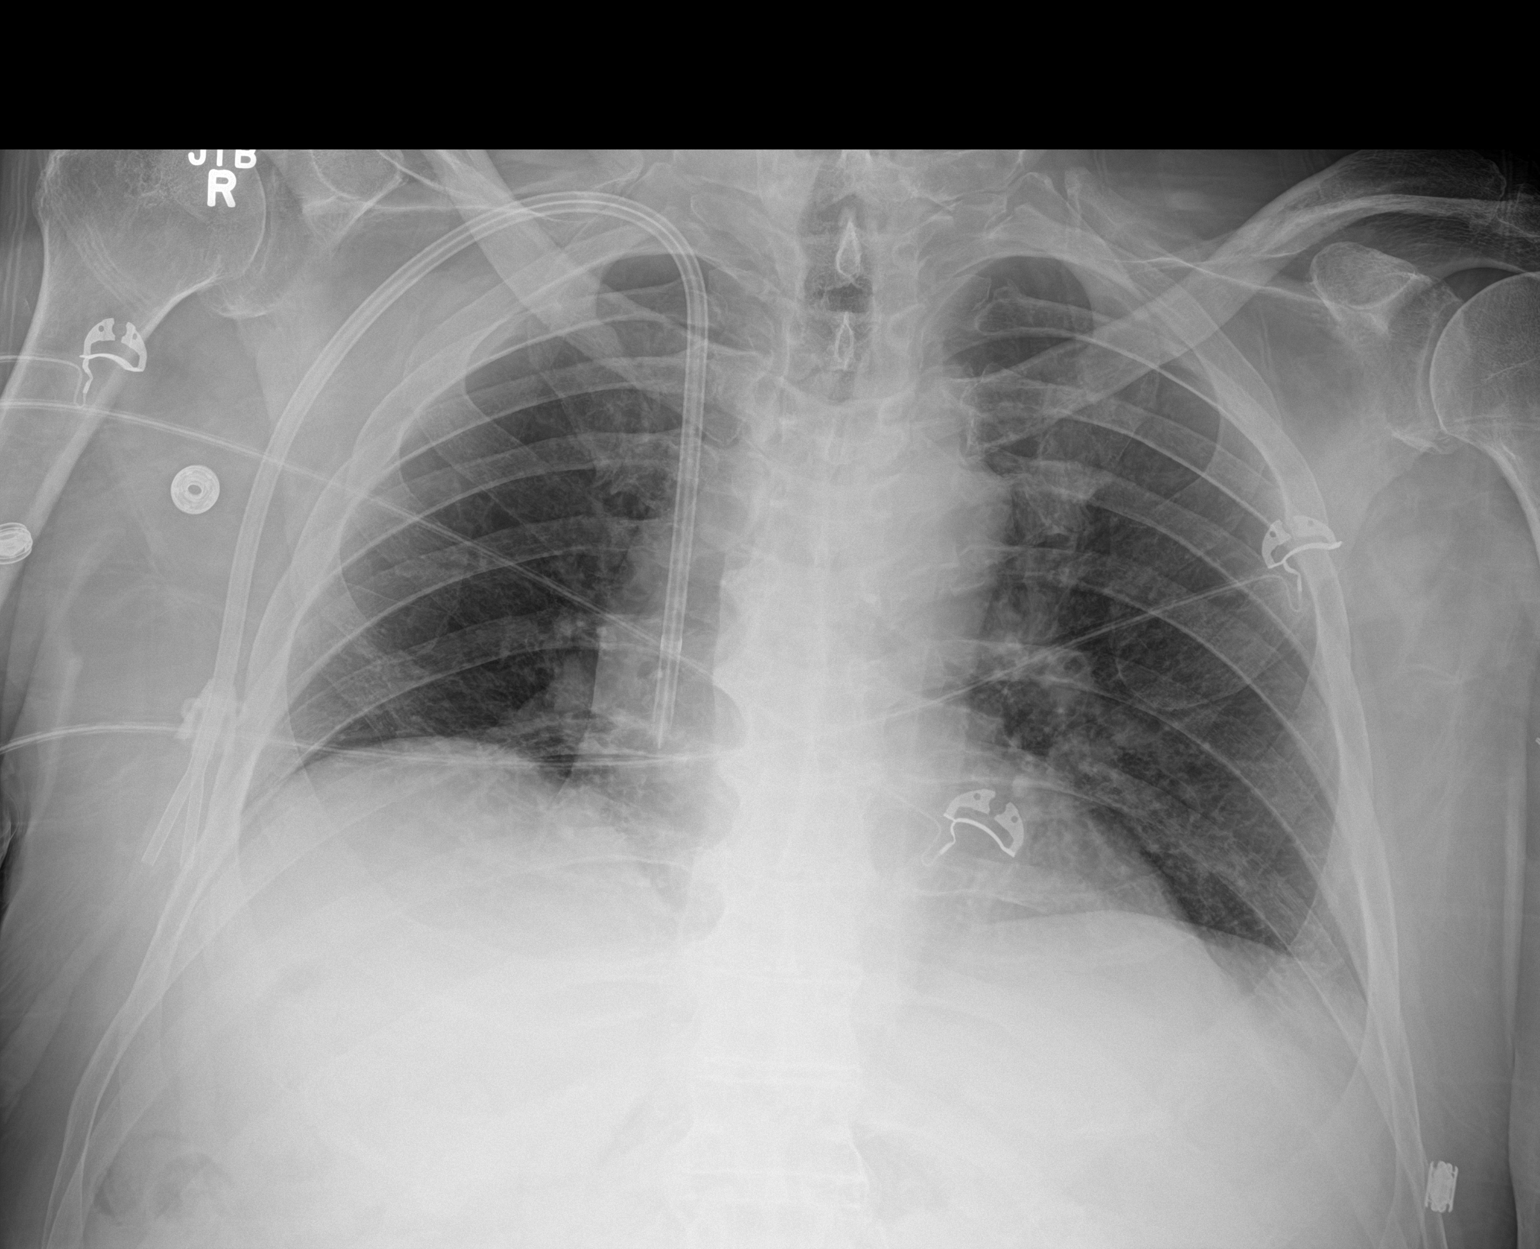

[1 of 1 positions shown; findings below may reference images not displayed]

FINDINGS: Interval placement of a tunneled right internal jugular dialysis
catheter with the tip in the proximal right atrium. The heart size
and mediastinal contours are within normal limits. Normal pulmonary
vascularity. Low lung volumes. No focal consolidation, pleural
effusion, or pneumothorax. No acute osseous abnormality.
IMPRESSION: 1. New tunneled right internal jugular dialysis catheter without
complicating feature.

## 2020-06-29 ENCOUNTER — Other Ambulatory Visit: Payer: Self-pay

## 2020-06-29 ENCOUNTER — Encounter: Payer: Self-pay | Admitting: Adult Health

## 2020-06-29 ENCOUNTER — Ambulatory Visit (HOSPITAL_COMMUNITY)
Admission: RE | Admit: 2020-06-29 | Discharge: 2020-06-29 | Disposition: A | Discharge status: Home / Self Care | Payer: Medicare Other | Source: Ambulatory Visit | Attending: Interventional Radiology | Admitting: Interventional Radiology

## 2020-06-29 ENCOUNTER — Ambulatory Visit (INDEPENDENT_AMBULATORY_CARE_PROVIDER_SITE_OTHER): Payer: Medicare Other | Admitting: Adult Health

## 2020-06-29 VITALS — BP 97/62 | HR 83 | Ht 67.5 in | Wt 203.0 lb

## 2020-06-29 DIAGNOSIS — I6521 Occlusion and stenosis of right carotid artery: Secondary | ICD-10-CM | POA: Insufficient documentation

## 2020-06-29 DIAGNOSIS — I10 Essential (primary) hypertension: Secondary | ICD-10-CM | POA: Diagnosis not present

## 2020-06-29 DIAGNOSIS — I63131 Cerebral infarction due to embolism of right carotid artery: Secondary | ICD-10-CM | POA: Diagnosis not present

## 2020-06-29 DIAGNOSIS — I63239 Cerebral infarction due to unspecified occlusion or stenosis of unspecified carotid arteries: Secondary | ICD-10-CM | POA: Diagnosis not present

## 2020-06-29 DIAGNOSIS — G319 Degenerative disease of nervous system, unspecified: Secondary | ICD-10-CM

## 2020-06-29 DIAGNOSIS — E785 Hyperlipidemia, unspecified: Secondary | ICD-10-CM

## 2020-06-29 NOTE — Patient Instructions (Signed)
Continue aspirin 81 mg daily and Brilinta (ticagrelor) 90 mg bid  and atorvastatin  for secondary stroke prevention  Continue to follow up with PCP regarding cholesterol and blood pressure management  Maintain strict control of hypertension with blood pressure goal below 130/90 and cholesterol with LDL cholesterol (bad cholesterol) goal below 70 mg/dL.     Followup in the future with me in 6 months or call earlier if needed      Thank you for coming to see [place] at [facility]. I hope we have been able to provide you high quality care today.  You may receive a patient satisfaction survey over the next few weeks. We would appreciate your feedback and comments so that we may continue to improve ourselves and the health of our patients.

## 2020-06-29 NOTE — Progress Notes (Signed)
[facility]    Provider:  Dr Jaynee Eagles Requesting Provider: McLean-Scocuzza, Olivia Mackie * Primary Care Provider:  Bonnita Nasuti, MD  CC:  Stroke, follow up from hospital  HPI:   Today, 06/29/2020, Frank [name] returns for stroke follow-up accompanied by his daughter who provides majority of history.  Stable from stroke standpoint without new or reoccurring stroke/TIA symptoms.  Cognition has been stable without worsening and he continues to reside at [facility].  Remains on aspirin and Brilinta with mild bruising but no bleeding.  Continues on atorvastatin 20 mg daily without myalgias.  Blood pressure today 97/62 currently on midodrine 3 times daily and as needed during HD sessions.  He did have carotid ultrasound completed today ordered by Dr. Estanislado Pandy which showed right ICA stent <50% stenosis and left ICA 1 to 39% stenosis.  No concerns at this time.   Initial visit 12/23/2019  Frank [name] is a 79 y.o. male here as requested by McLean-Scocuzza, Olivia Mackie * for hospital follow up. PMHx thyroid disease, secondary hyperparathyroidism, end-stage renal disease on dialysis, peripheral vascular disease, peripheral neuropathy, insomnia, illiterate, hyperlipidemia, hypertension, diabetic nephropathy, diabetes mellitus, depression, chronic kidney disease, gout, bradycardia, anemia of chronic kidney disease, coronary artery disease, cerebral embolism with cerebral infarction and stenosis of the right carotid artery.  I reviewed notes from the hospital, patient presented on January 27 of this year for facial droop and altered mental status, family found him slumped over and having difficulty walking, legs weak for about a week, code stroke was initiated however he was outside the window for IV TPA.  His CTA head and neck showed a 75% stenosis of the right ICA.  MRI of the brain (personally reviewed images) showed multiple bilateral scattered infarcts in the watershed territories secondary to  hypoperfusion in the setting of chronic hypotension and right ICA high-grade stenosis, status post stenting of the right ICA, he was started on aspirin and Plavix.  Chronic hypotension, on midodrine, he was already on Lipitor and his LDL was 35, his hemoglobin A1c was 6.7 which was within goal.  Here with his daughter who also provides much information. He feels he is improved. He has always had some stuttering that worsened after his stroke but now improved back to baseline. He is using a walker. He has dialysis 3x weekly. He has a walker and walks well. He feels weaker after dialysis. He is living in assisted living. Daughter states he did very well in [facility], He wants to drive and we discussed I do not recommend that, he has memory changes likely dementia, daughter says his memory is worsening, he perseverates on subjects, now getting PT at assisted living and OT and speech too. Asiisted living managing medications. He had at least one fall at [facility] but now PT is working with him. He is at [facility] and we discussed given his cognitive status, his physical status, multiple complicated comorbidities and stroke, I do recommend that he stays in assisted living and I discussed there is no driving.  Patient was disappointed he still wants to drive, I did explain that it was not safe for him or for other people on the road to have him driving.  But I did try to encourage him that transportation is available to him.  He is adjusting to his new living environment.  He is starting to see a new primary care doctor at [facility], she saw him today and daughter said they liked  her very much and they discussed changing medications for anxiety.  We also discussed today his blood pressure is low systolic around 884, given his watershed infarcts and his atherosclerosis I do recommend that his blood pressure stay above 120 and he continues the midodrine 3 times a day.  For his  anxiety they can modify his trazodone, we could try a very very low-dose as needed benzodiazepine but I will defer to his new doctor at [facility] assisted living for this.  We discussed fall risks.  We discussed managing his vascular risk factors, and I am very happy that he has so much support between his daughter and his assisted living facility.  His daughter appears to be quite caring and knowledgeable.  He is eating well without swallowing difficulties.  No pain.  Reviewed notes, labs and imaging from outside physicians, which showed:  MRI of the brain (personally reviewed imaging) and agree with the following: 1. 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 No associated hemorrhage or mass effect. 2. Underlying moderately advanced age-related cerebral atrophy with mild chronic small vessel ischemic disease  Review of Systems: Patient complains of symptoms per HPI.  Pertinent negatives and positives per HPI. All others negative.   Social History   Socioeconomic History  . Marital status: Divorced    Spouse name: Not on file  . Number of children: 4  . Years of education: Not on file  . Highest education level: 8th grade  Occupational History  . Not on file  Tobacco Use  . Smoking status: Former Smoker    Years: 30.00    Quit date: 1994    Years since quitting: 27.8  . Smokeless tobacco: Never Used  Vaping Use  . Vaping Use: Never used  Substance and Sexual Activity  . Alcohol use: No  . Drug use: No  . Sexual activity: Not Currently  Other Topics Concern  . Not on file  Social History Narrative   4 kids (3 daughters and 1 son)   Daughter [name] DPR      Lives at [facility]    Social Determinants of Health   Financial Resource Strain: Low Risk   . Difficulty of Paying Living Expenses: Not hard at all  Food Insecurity: No Food  Insecurity  . Worried About Charity fundraiser in the Last Year: Never true  . Ran Out of Food in the Last Year: Never true  Transportation Needs: No Transportation Needs  . Lack of Transportation (Medical): No  . Lack of Transportation (Non-Medical): No  Physical Activity:   . Days of Exercise per Week: Not on file  . Minutes of Exercise per Session: Not on file  Stress: No Stress Concern Present  . Feeling of Stress : Only a little  Social Connections: Unknown  . Frequency of Communication with Friends and Family: More than three times a week  . Frequency of Social Gatherings with Friends and Family: Not on file  . Attends Religious Services: Not on file  . Active Member of Clubs or Organizations: Not on file  . Attends Archivist Meetings: Not on file  . Marital Status: Divorced  Human resources officer Violence: Not At Risk  . Fear of Current or Ex-Partner: No  . Emotionally Abused: No  . Physically Abused: No  . Sexually Abused: No    Family History  Problem Relation Age of Onset  . Diabetes Mother   .  Kidney disease Mother   . Liver disease Mother   . Heart Problems Mother   . Stroke Neg Hx     Past Medical History:  Diagnosis Date  . Anemia of chronic kidney failure   . Arthritis   . Bradycardia   . Cataracts, bilateral 05/15/2012  . Chronic idiopathic gout of multiple sites 02/10/2015  . Chronic kidney disease    [facility]  . Depression   . Diabetes mellitus without complication (HCC)    diet controlled   . Diabetic nephropathy ([place])   . ED (erectile dysfunction) 08/28/2012  . Elevated liver enzymes 06/10/2019  . Epiretinal membrane (ERM) of right eye 06/10/2019   Right eye saw AE 06/05/2019    . Essential hypertension 05/07/2013   Last Assessment & Plan:  Formatting of this note might be different from the original. HTN PLAN   BP goal is <140/90 BP Readings from Last 3 Encounters:  08/27/16 140/79  08/13/16 141/80  08/06/16 144/86   Current  Anti-Hyptensive Medications: None  Today's Recommendations: Continue lifestyle modifications. Defer starting medication to PCP.  Recommend continued regular exercise as tolerated. Recomm  . History of UTI    s/p foley in hospital 01/2019   . Hyperlipidemia   . Hypothyroidism   . Illiterate 11/19/2013   Last Assessment & Plan:  Assisted patient with Handicapped Placard application today.    . Insomnia 05/08/2019  . Neuropathy   . Peripheral neuropathy   . Peripheral vascular disease ([place])   . Second degree heart block   . Secondary hyperparathyroidism ([place])    Renal  . Thyroid disease     Patient Active Problem List   Diagnosis Date Noted  . Diarrhea, unspecified 01/26/2020  . Allergic rhinitis, unspecified 12/02/2019  . Fluid overload, unspecified 12/02/2019  . Atrioventricular block, Mobitz type 1, Wenckebach   . Left-sided weakness   . Cerebrovascular accident (CVA) due to embolism of right carotid artery ([place])   . Stenosis of right carotid artery   . Arterial hypotension   . Cerebral embolism with cerebral infarction 10/08/2019  . Slurred speech 10/07/2019  . Epiretinal membrane (ERM) of right eye 06/10/2019  . Elevated liver enzymes 06/10/2019  . Benign prostatic hyperplasia 05/08/2019  . Insomnia 05/08/2019  . Coronary artery disease involving native coronary artery of native heart without angina pectoris 05/08/2019  . Atherosclerosis 05/08/2019  . Hyperphosphatemia 05/08/2019  . Secondary hyperparathyroidism ([place])   . Hyperlipidemia   . Gout   . Anemia of chronic kidney failure   . Near syncope 01/21/2019  . Elevated troponin 01/21/2019  . Osteoarthritis of midfoot, right 08/13/2016  . Foot swelling 07/04/2016  . DM type 2 with diabetic peripheral neuropathy ([place]) 11/28/2015  . Pre-transplant evaluation for kidney transplant 11/28/2015  . HLD (hyperlipidemia) 08/01/2015  . 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 Chronic idiopathic gout of multiple sites 02/10/2015  . Illiterate 11/19/2013  . ESRD (end stage renal disease) ([place]) 05/07/2013  . Essential hypertension 05/07/2013  . Osteoarthritis 05/07/2013  . Diastasis recti 10/29/2012  . 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 ED (erectile dysfunction) 08/28/2012  . Cataracts, bilateral 05/15/2012  . Chronic pain 05/15/2012  . DM (diabetes mellitus) ([place]) 05/15/2012  . Gout of knee 05/15/2012  . Hyperkalemia 05/15/2012  . Hypothyroidism 05/15/2012    Past Surgical History:  Procedure Laterality Date  . AV FISTULA PLACEMENT Left 11/19/2017   Procedure: ARTERIOVENOUS (AV) FISTULA CREATION;  Surgeon: [name], MD;  Location: [facility];  Service: Vascular;  Laterality: Left;  . BASCILIC VEIN TRANSPOSITION Left 06/04/2018   Procedure: BASILIC VEIN TRANSPOSITION ARM;  Surgeon: [name], MD;  Location: [facility];  Service: Vascular;  Laterality: Left;  . COLONOSCOPY    . DIALYSIS/PERMA CATHETER REMOVAL N/A 03/23/2019   Procedure: DIALYSIS/PERMA CATHETER REMOVAL;  Surgeon: [name], MD;  Location: [facility];  Service: Cardiovascular;  Laterality: N/A;  . EYE SURGERY     cataract surgery b/l; photophobia since   . INSERTION OF DIALYSIS CATHETER N/A 01/23/2019   Procedure: INSERTION OF DIALYSIS CATHETER;  Surgeon: [name], MD;  Location: [facility];  Service: Vascular;  Laterality: N/A;  INSERTION OF DIALYSIS CATHETER  . IR ANGIO INTRA EXTRACRAN SEL COM CAROTID INNOMINATE UNI L MOD SED  10/15/2019  . IR ANGIO VERTEBRAL SEL SUBCLAVIAN INNOMINATE UNI R MOD SED  10/15/2019  . IR CT HEAD LTD  10/15/2019  . IR FLUORO GUIDE CV LINE RIGHT  10/08/2019  . IR INTRAVSC STENT CERV CAROTID W/EMB-PROT MOD SED INCL ANGIO  10/15/2019  . IR THROMBECTOMY AV FISTULA W/THROMBOLYSIS/PTA INC/SHUNT/IMG LEFT Left 10/09/2019  . IR US GUIDE VASC ACCESS LEFT  10/09/2019  . IR US GUIDE VASC ACCESS  RIGHT  10/08/2019  . KNEE ARTHROSCOPY Left    Knee  . PERIPHERAL VASCULAR THROMBECTOMY Left 07/06/2019   Procedure: PERIPHERAL VASCULAR THROMBECTOMY;  Surgeon: [name], MD;  Location: [facility];  Service: Cardiovascular;  Laterality: Left;  . RADIOLOGY WITH ANESTHESIA N/A 10/15/2019   Procedure: stent placement;  Surgeon: Luanne Bras, MD;  Location: [facility];  Service: Radiology;  Laterality: N/A;  . THROMBECTOMY W/ EMBOLECTOMY Left 01/27/2019   Procedure: THROMBECTOMY Left arm ARTERIOVENOUS FISTULA  and Left arm Arteriovenous gortex graft;  Surgeon: [name], MD;  Location: [facility];  Service: Vascular;  Laterality: Left;  Marland Kitchen VENOGRAM Left 01/27/2019   Procedure: Left arm FISTULOGRAM/;  Surgeon: [name], MD;  Location: [facility];  Service: Vascular;  Laterality: Left;    Current Outpatient Medications  Medication Sig Dispense Refill  . acetaminophen (TYLENOL) 325 MG tablet Take 325 mg by mouth every 6 (six) hours as needed. 316m po q4hrs    . allopurinol (ZYLOPRIM) 100 MG tablet Take 1 tablet (100 mg total) by mouth daily. 90 tablet 3  . ALPRAZolam (XANAX) 0.25 MG tablet BID PRN    . alum & mag hydroxide-simeth (MAALOX/MYLANTA) 200-200-20 MG/5ML suspension Take 5 mLs by mouth every 6 (six) hours as needed for indigestion or heartburn.    . Amino Acids-Protein Hydrolys (FEEDING SUPPLEMENT, PRO-STAT SUGAR FREE 64,) LIQD Take 30 mLs by mouth daily.     .Marland Kitchenaspirin EC 81 MG tablet Take 81 mg daily by mouth.    .Marland Kitchenatorvastatin (LIPITOR) 20 MG tablet Take 20 mg by mouth daily at 6 PM.     . benzonatate (TESSALON) 200 MG capsule Take 200 mg by mouth 3 (three) times daily as needed for cough.    . bisacodyl (DULCOLAX) 10 MG suppository Place 10 mg rectally as needed for moderate constipation.    . bismuth subsalicylate (PEPTO BISMOL) 262 MG/15ML suspension Take 15 mLs by mouth every 6 (six) hours as needed. q2 hrs PRN up to 6 doses in 24hrs    . blood glucose meter kit and  supplies Dispense based on patient and insurance preference. Use up to two times daily as directed. (FOR ICD-10  E10.9, E11.9). STRIPS and LANCETS 1 year supply 1 each 0  . calcitRIOL (ROCALTROL) 0.5 MCG capsule Take 1 capsule (0.5 mcg total) by mouth Every Tuesday,Thursday,and Saturday with dialysis. 30 capsule 1  . clotrimazole (LOTRIMIN) 1 % cream Apply 1 application topically daily as needed (For yeast, groin rash).    Marland Kitchen docusate sodium (COLACE) 100 MG capsule Take 200 mg by mouth daily as needed.     . donepezil (ARICEPT) 5 MG tablet Take 1 tablet (5 mg total) by mouth at bedtime. 30 tablet 6  . guaifenesin (ROBITUSSIN) 100 MG/5ML syrup Take 100 mg by mouth 3 (three) times daily as needed for cough. Get 54m q6hr PRN    . ketoconazole (NIZORAL) 2 % shampoo Apply 1 application topically once. On Monday and Thursday    . levothyroxine (SYNTHROID) 175 MCG tablet Take 1 tablet (175 mcg total) by mouth daily before breakfast. 30 tablet 0  . lidocaine-prilocaine (EMLA) cream Apply 1 application topically See admin instructions. Apply to access site 1 to 2 hours before dialysis. Cover with occlusive dressing.On Tuesday,Thursday and Saturday    . loperamide (IMODIUM) 2 MG capsule Take 2 mg by mouth as needed for diarrhea or loose stools.    . magnesium hydroxide (MILK OF MAGNESIA) 400 MG/5ML suspension Take 5 mLs by mouth daily as needed for mild constipation. 30cc 2 tablespoons full  By mounth as needed for one dose    . Melatonin 3 MG CAPS Take by mouth. One tab po at bed time- Taking 156m   . midodrine (PROAMATINE) 10 MG tablet Take 1 tablet (10 mg total) by mouth 3 (three) times daily with meals. (Patient taking differently: Take 10 mg by mouth See admin instructions. With meals. Administer 1/2 hour before dialysis on Tuesday, Thursday, Saturday.) 270 tablet 3  . Multiple Vitamin (MULTIVITAMIN WITH MINERALS) TABS tablet Take 1 tablet by mouth daily.    . polyethylene glycol (MIRALAX / GLYCOLAX) 17 g  packet Take 17 g by mouth daily. 14 each 0  . senna (SENOKOT) 8.6 MG TABS tablet Take 1 tablet by mouth.    . sevelamer carbonate (RENVELA) 800 MG tablet Take 3 tablets (2,400 mg total) by mouth 3 (three) times daily with meals. 120 tablet 0  . sodium phosphate Pediatric (FLEET) 3.5-9.5 GM/59ML enema Place 1 enema rectally once. PRN    . tamsulosin (FLOMAX) 0.4 MG CAPS capsule Take 1 capsule (0.4 mg total) by mouth daily. (Patient taking differently: Take 0.8 mg by mouth daily. 2 capsule) 30 capsule 1  . ticagrelor (BRILINTA) 90 MG TABS tablet Take 1 tablet (90 mg total) by mouth 2 (two) times daily. 60 tablet 3  . traZODone (DESYREL) 50 MG tablet Take 0.5 tablets (25 mg total) by mouth at bedtime as needed for sleep. 90 tablet 3  . Zinc Oxide (BALMEX EX) Apply 1 application topically daily as needed (to irritated areas of skin).     . pantoprazole (PROTONIX) 40 MG tablet Take 1 tablet (40 mg total) by mouth daily. 30 tablet 1   No current facility-administered medications for this visit.    Allergies as of 06/29/2020 - Review Complete 05/25/2020  Allergen Reaction Noted  . Ace inhibitors Other (See Comments) 05/15/2012  . Angiotensin receptor blockers Other (See Comments) 05/15/2012  . Ivp dye [iodinated diagnostic agents] Other (See Comments) 01/21/2019  . Adhesive [tape] Other (See Comments) 01/21/2019    Vitals: Today's Vitals   06/29/20 1041  BP: 97/62  Pulse: 83  Weight:  203 lb (92.1 kg)  Height: 5' 7.5" (1.715 m)   Body mass index is 31.33 kg/m.  Physical exam: General: well developed, well nourished,  very pleasant elderly Caucasian male, seated, in no evident distress Head: head normocephalic and atraumatic.   Neck: supple with no carotid or supraclavicular bruits Cardiovascular: regular rate and rhythm, no murmurs Musculoskeletal: no deformity Skin:  no rash/petichiae Vascular:  Normal pulses all extremities   Neurologic Exam Mental Status: Awake and fully alert.    Mild to moderate stuttering but no evidence of aphasia or dysarthria.  Oriented to place and time. Recent memory impaired and remote memory intact. Attention span, concentration and fund of knowledge appropriate during visit. Mood and affect appropriate.  Cranial Nerves: Pupils equal, briskly reactive to light. Extraocular movements full without nystagmus. Visual fields full to confrontation. Hearing intact. Facial sensation intact. Face, tongue, palate moves normally and symmetrically.  Motor: Normal bulk and tone. Normal strength in all tested extremity muscles. Sensory.: intact to touch , pinprick , position and vibratory sensation.  Coordination: Rapid alternating movements normal in all extremities. Finger-to-nose and heel-to-shin performed accurately bilaterally. Gait and Station: Arises from chair without difficulty. Stance is normal. Gait demonstrates normal stride length and balance Reflexes: 1+ and symmetric. Toes downgoing.      Assessment/Plan:  79 y.o. male here as requested by McLean-Scocuzza, Olivia Mackie * for hospital follow up. 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  -Multiple bilateral scattered infarcts secondary to hypoperfusion in the setting of right ICA high-grade stenosis, status post stenting.  Chronic ultrasound today showed right ICA stent<50% stenosis currently monitored by Dr. Estanislado Pandy.  He remains on aspirin and Brilinta and advised to discuss further with Dr. Estanislado Pandy regarding ongoing use and duration of Brilinta. -Continue atorvastatin 20 mg  daily for HLD management and secondary stroke prevention -Discussed importance of avoiding hypotension and to continue midodrine 3 times daily and as needed during HD visits which can be managed and prescribed by cardiology/nephrology -Remains on Aricept 5 mg nightly with cognition stable since prior visit -continue Aricept 5 mg daily managed by facility   Follow-up in 6 months or call earlier if needed  I spent 30 minutes of face-to-face and non-face-to-face time with patient and daughter.  This included previsit chart review, lab review, study review, order entry, electronic health record documentation, patient education and discussion regarding history of stroke, right ICA stenosis s/p stent and review of recent ultrasound, ongoing use of above medications, importance of managing stroke risk factors and answered all questions to patient and daughter satisfaction  Frann Rider, AGNP-BC  [facility] [address] [place], [place] [address]  Phone [phone] Fax [phone] Note: This document was prepared with digital dictation and possible smart phrase technology. Any transcriptional errors that result from this process are unintentional.

## 2020-07-01 ENCOUNTER — Telehealth (HOSPITAL_COMMUNITY): Payer: Self-pay | Admitting: Radiology

## 2020-07-01 NOTE — Progress Notes (Signed)
I agree with the above plan 

## 2020-07-01 NOTE — Telephone Encounter (Signed)
Called pt's daughter. Per Deveshwar pt is to have another US carotids in 6 months time. She agreed with this plan. She wanted to know if her dad needs to continue on the Brilinta or if he can come off of this medication. I will ask Deveshwar and call her back. JM

## 2020-07-05 ENCOUNTER — Telehealth (HOSPITAL_COMMUNITY): Payer: Self-pay | Admitting: Radiology

## 2020-07-05 NOTE — Telephone Encounter (Signed)
Called pt's daughter, per Dr. Estanislado Pandy pt is to continue the Brilinta for now and Deveshwar will re-evaluate after his next follow-up US carotids in 6 months. Daughter agrees with this plan of care. JM

## 2020-09-10 HISTORY — PX: INSERTION OF DIALYSIS CATHETER: SHX1324

## 2020-11-06 ENCOUNTER — Emergency Department
Admission: EM | Admit: 2020-11-06 | Discharge: 2020-11-06 | Disposition: A | Discharge status: Home / Self Care | Payer: Medicare Other | Attending: Emergency Medicine | Admitting: Emergency Medicine

## 2020-11-06 ENCOUNTER — Emergency Department: Payer: Medicare Other

## 2020-11-06 ENCOUNTER — Other Ambulatory Visit: Payer: Self-pay

## 2020-11-06 DIAGNOSIS — R110 Nausea: Secondary | ICD-10-CM

## 2020-11-06 DIAGNOSIS — E1121 Type 2 diabetes mellitus with diabetic nephropathy: Secondary | ICD-10-CM | POA: Diagnosis not present

## 2020-11-06 DIAGNOSIS — Z992 Dependence on renal dialysis: Secondary | ICD-10-CM | POA: Diagnosis not present

## 2020-11-06 DIAGNOSIS — Z79899 Other long term (current) drug therapy: Secondary | ICD-10-CM | POA: Diagnosis not present

## 2020-11-06 DIAGNOSIS — R112 Nausea with vomiting, unspecified: Secondary | ICD-10-CM | POA: Insufficient documentation

## 2020-11-06 DIAGNOSIS — E039 Hypothyroidism, unspecified: Secondary | ICD-10-CM | POA: Diagnosis not present

## 2020-11-06 DIAGNOSIS — Z87891 Personal history of nicotine dependence: Secondary | ICD-10-CM | POA: Insufficient documentation

## 2020-11-06 DIAGNOSIS — Z8673 Personal history of transient ischemic attack (TIA), and cerebral infarction without residual deficits: Secondary | ICD-10-CM | POA: Insufficient documentation

## 2020-11-06 DIAGNOSIS — I2510 Atherosclerotic heart disease of native coronary artery without angina pectoris: Secondary | ICD-10-CM | POA: Insufficient documentation

## 2020-11-06 DIAGNOSIS — E1142 Type 2 diabetes mellitus with diabetic polyneuropathy: Secondary | ICD-10-CM | POA: Diagnosis not present

## 2020-11-06 DIAGNOSIS — I120 Hypertensive chronic kidney disease with stage 5 chronic kidney disease or end stage renal disease: Secondary | ICD-10-CM | POA: Insufficient documentation

## 2020-11-06 DIAGNOSIS — N186 End stage renal disease: Secondary | ICD-10-CM | POA: Diagnosis not present

## 2020-11-06 DIAGNOSIS — E1122 Type 2 diabetes mellitus with diabetic chronic kidney disease: Secondary | ICD-10-CM | POA: Insufficient documentation

## 2020-11-06 DIAGNOSIS — R109 Unspecified abdominal pain: Secondary | ICD-10-CM | POA: Diagnosis not present

## 2020-11-06 LAB — URINALYSIS, COMPLETE (UACMP) WITH MICROSCOPIC
Bilirubin Urine: NEGATIVE
Glucose, UA: >=500 mg/dL — AB
Ketones, ur: NEGATIVE mg/dL
Nitrite: NEGATIVE
Protein, ur: 100 mg/dL — AB
RBC / HPF: >50 RBC/hpf — ABNORMAL HIGH (ref 0–5)
Specific Gravity, Urine: 1.009 (ref 1.005–1.030)
pH: 6 (ref 5.0–8.0)

## 2020-11-06 LAB — CBC
HCT: 35.7 % — ABNORMAL LOW (ref 39.0–52.0)
Hemoglobin: 11.6 g/dL — ABNORMAL LOW (ref 13.0–17.0)
MCH: 31.4 pg (ref 26.0–34.0)
MCHC: 32.5 g/dL (ref 30.0–36.0)
MCV: 96.5 fL (ref 80.0–100.0)
Platelets: 178 10*3/uL (ref 150–400)
RBC: 3.7 MIL/uL — ABNORMAL LOW (ref 4.22–5.81)
RDW: 13.9 % (ref 11.5–15.5)
WBC: 11.3 10*3/uL — ABNORMAL HIGH (ref 4.0–10.5)
nRBC: 0 % (ref 0.0–0.2)

## 2020-11-06 LAB — COMPREHENSIVE METABOLIC PANEL
ALT: 13 U/L (ref 0–44)
AST: 19 U/L (ref 15–41)
Albumin: 4.1 g/dL (ref 3.5–5.0)
Alkaline Phosphatase: 124 U/L (ref 38–126)
Anion gap: 15 (ref 5–15)
BUN: 88 mg/dL — ABNORMAL HIGH (ref 8–23)
CO2: 21 mmol/L — ABNORMAL LOW (ref 22–32)
Calcium: 9.3 mg/dL (ref 8.9–10.3)
Chloride: 102 mmol/L (ref 98–111)
Creatinine, Ser: 10.88 mg/dL — ABNORMAL HIGH (ref 0.61–1.24)
GFR, Estimated: 4 mL/min — ABNORMAL LOW (ref >60)
Glucose, Bld: 217 mg/dL — ABNORMAL HIGH (ref 70–99)
Potassium: 5.1 mmol/L (ref 3.5–5.1)
Sodium: 138 mmol/L (ref 135–145)
Total Bilirubin: 0.7 mg/dL (ref 0.3–1.2)
Total Protein: 8.2 g/dL — ABNORMAL HIGH (ref 6.5–8.1)

## 2020-11-06 LAB — LIPASE, BLOOD: Lipase: 61 U/L — ABNORMAL HIGH (ref 11–51)

## 2020-11-06 MED ORDER — ONDANSETRON HCL 4 MG/2ML IJ SOLN: 4.0000 mg | Freq: Once | INTRAMUSCULAR | Status: DC

## 2020-11-06 MED ORDER — ONDANSETRON HCL 4 MG PO TABS: 4.0000 mg | ORAL_TABLET | Freq: Three times a day (TID) | ORAL | 0 refills | Status: AC | PRN

## 2020-11-06 NOTE — ED Provider Notes (Signed)
[facility] Emergency Department Provider Note   ____________________________________________   I have reviewed the triage vital signs and the nursing notes.   HISTORY  Chief Complaint Abdominal Pain   History limited by: Not Limited   HPI Frank [name] is a 80 y.o. male who presents to the emergency department today because of concerns for abdominal pain.  Patient states that the pain is located in his lower abdomen.  The patient's pain started after he ate some apple time.  He states that the pain has been constant since then.  It has been accompanied by multiple episodes of nausea and vomiting.  Patient states that he recently has had some diarrhea since he was given a meal consisting of meatball sauce and bread.  In addition the patient is on dialysis Tuesday Thursday Saturday.  He states when he went to dialysis yesterday they were unable to access his fistula.  He says he does have an appointment tomorrow morning at 7 AM to try to have his fistula worked on or have a catheter placed in his chest.  Records reviewed. Per medical record review patient has a history of end stage renal disease on dialysis.   Past Medical History:  Diagnosis Date  . Anemia of chronic kidney failure   . Arthritis   . Bradycardia   . Cataracts, bilateral 05/15/2012  . Chronic idiopathic gout of multiple sites 02/10/2015  . Chronic kidney disease    [facility]  . Depression   . Diabetes mellitus without complication (HCC)    diet controlled   . Diabetic nephropathy ([place])   . ED (erectile dysfunction) 08/28/2012  . Elevated liver enzymes 06/10/2019  . Epiretinal membrane (ERM) of right eye 06/10/2019   Right eye saw AE 06/05/2019    . Essential hypertension 05/07/2013   Last Assessment & Plan:  Formatting of this note might be different from the original. HTN PLAN   BP goal is <140/90 BP Readings from Last 3 Encounters:  08/27/16 140/79  08/13/16 141/80  08/06/16 144/86    Current Anti-Hyptensive Medications: None  Today's Recommendations: Continue lifestyle modifications. Defer starting medication to PCP.  Recommend continued regular exercise as tolerated. Recomm  . History of UTI    s/p foley in hospital 01/2019   . Hyperlipidemia   . Hypothyroidism   . Illiterate 11/19/2013   Last Assessment & Plan:  Assisted patient with Handicapped Placard application today.    . Insomnia 05/08/2019  . Neuropathy   . Peripheral neuropathy   . Peripheral vascular disease ([place])   . Second degree heart block   . Secondary hyperparathyroidism ([place])    Renal  . Thyroid disease     Patient Active Problem List   Diagnosis Date Noted  . Diarrhea, unspecified 01/26/2020  . Allergic rhinitis, unspecified 12/02/2019  . Fluid overload, unspecified 12/02/2019  . Atrioventricular block, Mobitz type 1, Wenckebach   . Left-sided weakness   . 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 Stenosis of right carotid artery   . Arterial hypotension   . Cerebral embolism with cerebral infarction 10/08/2019  . Slurred speech 10/07/2019  . Epiretinal membrane (ERM) of right eye 06/10/2019  . Elevated liver enzymes 06/10/2019  . Benign prostatic hyperplasia 05/08/2019  . Insomnia 05/08/2019  . Coronary artery disease involving native coronary artery of native heart without angina pectoris 05/08/2019  . Atherosclerosis 05/08/2019  . Hyperphosphatemia 05/08/2019  . Secondary hyperparathyroidism ([place])   .  Hyperlipidemia   . Gout   . Anemia of chronic kidney failure   . Near syncope 01/21/2019  . Elevated troponin 01/21/2019  . Osteoarthritis of midfoot, right 08/13/2016  . Foot swelling 07/04/2016  . DM type 2 with diabetic peripheral neuropathy ([place]) 11/28/2015  . Pre-transplant evaluation for kidney transplant 11/28/2015  . HLD (hyperlipidemia) 08/01/2015  . Combined arterial insufficiency and corporo-venous occlusive erectile dysfunction  02/10/2015  . Chronic idiopathic gout of multiple sites 02/10/2015  . Illiterate 11/19/2013  . ESRD (end stage renal disease) ([place]) 05/07/2013  . Essential hypertension 05/07/2013  . Osteoarthritis 05/07/2013  . Diastasis recti 10/29/2012  . 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 ED (erectile dysfunction) 08/28/2012  . Cataracts, bilateral 05/15/2012  . Chronic pain 05/15/2012  . DM (diabetes mellitus) ([place]) 05/15/2012  . Gout of knee 05/15/2012  . Hyperkalemia 05/15/2012  . Hypothyroidism 05/15/2012    Past Surgical History:  Procedure Laterality Date  . AV FISTULA PLACEMENT Left 11/19/2017   Procedure: ARTERIOVENOUS (AV) FISTULA CREATION;  Surgeon: [name], MD;  Location: [place];  Service: Vascular;  Laterality: Left;  . BASCILIC VEIN TRANSPOSITION Left 06/04/2018   Procedure: BASILIC VEIN TRANSPOSITION ARM;  Surgeon: [name], MD;  Location: [place];  Service: Vascular;  Laterality: Left;  . COLONOSCOPY    . DIALYSIS/PERMA CATHETER REMOVAL N/A 03/23/2019   Procedure: DIALYSIS/PERMA CATHETER REMOVAL;  Surgeon: [name], MD;  Location: [place] [facility];  Service: Cardiovascular;  Laterality: N/A;  . EYE SURGERY     cataract surgery b/l; photophobia since   . INSERTION OF DIALYSIS CATHETER N/A 01/23/2019   Procedure: INSERTION OF DIALYSIS CATHETER;  Surgeon: [name], MD;  Location: [facility];  Service: Vascular;  Laterality: N/A;  INSERTION OF DIALYSIS CATHETER  . IR ANGIO INTRA EXTRACRAN SEL COM CAROTID INNOMINATE UNI L MOD SED  10/15/2019  . IR ANGIO VERTEBRAL SEL SUBCLAVIAN INNOMINATE UNI R MOD SED  10/15/2019  . IR CT HEAD LTD  10/15/2019  . IR FLUORO GUIDE CV LINE RIGHT  10/08/2019  . IR INTRAVSC STENT CERV CAROTID W/EMB-PROT MOD SED INCL ANGIO  10/15/2019  . IR THROMBECTOMY AV FISTULA W/THROMBOLYSIS/PTA INC/SHUNT/IMG LEFT Left 10/09/2019  . IR US GUIDE VASC ACCESS LEFT  10/09/2019  . IR US GUIDE  VASC ACCESS RIGHT  10/08/2019  . KNEE ARTHROSCOPY Left    Knee  . PERIPHERAL VASCULAR THROMBECTOMY Left 07/06/2019   Procedure: PERIPHERAL VASCULAR THROMBECTOMY;  Surgeon: [name], MD;  Location: [place] [facility];  Service: Cardiovascular;  Laterality: Left;  . RADIOLOGY WITH ANESTHESIA N/A 10/15/2019   Procedure: stent placement;  Surgeon: Luanne Bras, MD;  Location: [place];  Service: Radiology;  Laterality: N/A;  . THROMBECTOMY W/ EMBOLECTOMY Left 01/27/2019   Procedure: THROMBECTOMY Left arm ARTERIOVENOUS FISTULA  and Left arm Arteriovenous gortex graft;  Surgeon: [name], MD;  Location: [place];  Service: Vascular;  Laterality: Left;  Marland Kitchen VENOGRAM Left 01/27/2019   Procedure: Left arm FISTULOGRAM/;  Surgeon: [name], MD;  Location: [facility];  Service: Vascular;  Laterality: Left;    Prior to Admission medications   Medication Sig Start Date End Date Taking? Authorizing Provider  acetaminophen (TYLENOL) 325 MG tablet Take 325 mg by mouth every 6 (six) hours as needed. 342m po q4hrs    [provider]  allopurinol (ZYLOPRIM) 100 MG tablet Take 1 tablet (100 mg total) by mouth daily. 06/03/19  McLean-Scocuzza, Nino Glow, MD  ALPRAZolam Duanne Moron) 0.25 MG tablet BID PRN 01/11/20   [provider]  alum & mag hydroxide-simeth (MAALOX/MYLANTA) 200-200-20 MG/5ML suspension Take 5 mLs by mouth every 6 (six) hours as needed for indigestion or heartburn.    [provider]  Amino Acids-Protein Hydrolys (FEEDING SUPPLEMENT, PRO-STAT SUGAR FREE 64,) LIQD Take 30 mLs by mouth daily.     [provider]  aspirin EC 81 MG tablet Take 81 mg daily by mouth.    [provider]  atorvastatin (LIPITOR) 20 MG tablet Take 20 mg by mouth daily at 6 PM.     [provider]  benzonatate (TESSALON) 200 MG capsule Take 200 mg by mouth 3 (three) times daily as needed for cough.    [provider]  bisacodyl (DULCOLAX) 10 MG suppository  Place 10 mg rectally as needed for moderate constipation.    [provider]  bismuth subsalicylate (PEPTO BISMOL) 262 MG/15ML suspension Take 15 mLs by mouth every 6 (six) hours as needed. q2 hrs PRN up to 6 doses in 24hrs    [provider]  blood glucose meter kit and supplies Dispense based on patient and insurance preference. Use up to two times daily as directed. (FOR ICD-10 E10.9, E11.9). STRIPS and LANCETS 1 year supply 12/21/19   McLean-Scocuzza, Nino Glow, MD  calcitRIOL (ROCALTROL) 0.5 MCG capsule Take 1 capsule (0.5 mcg total) by mouth Every Tuesday,Thursday,and Saturday with dialysis. 10/24/19   Regalado, Belkys A, MD  clotrimazole (LOTRIMIN) 1 % cream Apply 1 application topically daily as needed (For yeast, groin rash).    [provider]  docusate sodium (COLACE) 100 MG capsule Take 200 mg by mouth daily as needed.     [provider]  donepezil (ARICEPT) 5 MG tablet Take 1 tablet (5 mg total) by mouth at bedtime. 12/23/19   Melvenia Beam, MD  guaifenesin (ROBITUSSIN) 100 MG/5ML syrup Take 100 mg by mouth 3 (three) times daily as needed for cough. Get 8m q6hr PRN    [provider]  ketoconazole (NIZORAL) 2 % shampoo Apply 1 application topically once. On Monday and Thursday 10/24/19   [provider]  levothyroxine (SYNTHROID) 175 MCG tablet Take 1 tablet (175 mcg total) by mouth daily before breakfast. 10/24/19   Regalado, Belkys A, MD  lidocaine-prilocaine (EMLA) cream Apply 1 application topically See admin instructions. Apply to access site 1 to 2 hours before dialysis. Cover with occlusive dressing.On Tuesday,Thursday and Saturday 04/08/19   [provider]  loperamide (IMODIUM) 2 MG capsule Take 2 mg by mouth as needed for diarrhea or loose stools.    [provider]  magnesium hydroxide (MILK OF MAGNESIA) 400 MG/5ML suspension Take 5 mLs by mouth daily as needed for mild constipation. 30cc 2 tablespoons full  By  mounth as needed for one dose    [provider]  Melatonin 3 MG CAPS Take by mouth. One tab po at bed time- Taking 120m   [provider]  midodrine (PROAMATINE) 10 MG tablet Take 1 tablet (10 mg total) by mouth 3 (three) times daily with meals. Patient taking differently: Take 10 mg by mouth See admin instructions. With meals. Administer 1/2 hour before dialysis on Tuesday, Thursday, Saturday. 12/14/19   TiShirley FriarPA-C  Multiple Vitamin (MULTIVITAMIN WITH MINERALS) TABS tablet Take 1 tablet by mouth daily.    [provider]  pantoprazole (PROTONIX) 40 MG tablet Take 1 tablet (40 mg total)  by mouth daily. 10/23/19 05/25/20  Regalado, Belkys A, MD  polyethylene glycol (MIRALAX / GLYCOLAX) 17 g packet Take 17 g by mouth daily. 10/23/19   Regalado, Belkys A, MD  senna (SENOKOT) 8.6 MG TABS tablet Take 1 tablet by mouth.    [provider]  sevelamer carbonate (RENVELA) 800 MG tablet Take 3 tablets (2,400 mg total) by mouth 3 (three) times daily with meals. 10/23/19   Regalado, Belkys A, MD  sodium phosphate Pediatric (FLEET) 3.5-9.5 GM/59ML enema Place 1 enema rectally once. PRN    [provider]  tamsulosin (FLOMAX) 0.4 MG CAPS capsule Take 1 capsule (0.4 mg total) by mouth daily. Patient taking differently: Take 0.8 mg by mouth daily. 2 capsule 10/23/19   Regalado, Belkys A, MD  ticagrelor (BRILINTA) 90 MG TABS tablet Take 1 tablet (90 mg total) by mouth 2 (two) times daily. 10/23/19   Regalado, Belkys A, MD  traZODone (DESYREL) 50 MG tablet Take 0.5 tablets (25 mg total) by mouth at bedtime as needed for sleep. 12/28/19   McLean-Scocuzza, Nino Glow, MD  Zinc Oxide (BALMEX EX) Apply 1 application topically daily as needed (to irritated areas of skin).     [provider]    Allergies Ace inhibitors, Angiotensin receptor blockers, Ivp dye [iodinated diagnostic agents], and Adhesive [tape]  Family History  Problem Relation Age of Onset   . Diabetes Mother   . Kidney disease Mother   . Liver disease Mother   . Heart Problems Mother   . Stroke Neg Hx     Social History Social History   Tobacco Use  . Smoking status: Former Smoker    Years: 30.00    Quit date: 1994    Years since quitting: 28.1  . Smokeless tobacco: Never Used  Vaping Use  . Vaping Use: Never used  Substance Use Topics  . Alcohol use: No  . Drug use: No    Review of Systems Constitutional: No fever/chills Eyes: No visual changes. ENT: No sore throat. Cardiovascular: Denies chest pain. Respiratory: Denies shortness of breath. Gastrointestinal: Positive for lower abdominal pain, nausea and vomiting.  Genitourinary: Negative for dysuria. Musculoskeletal: Negative for back pain. Skin: Negative for rash. Neurological: Negative for headaches, focal weakness or numbness.  ____________________________________________   PHYSICAL EXAM:  VITAL SIGNS: ED Triage Vitals  Enc Vitals Group     BP 11/06/20 1902 (!) 159/109     Pulse Rate 11/06/20 1902 91     Resp 11/06/20 1902 20     Temp 11/06/20 1902 98.3 F (36.8 C)     Temp Source 11/06/20 1902 Oral     SpO2 11/06/20 1902 93 %     Weight 11/06/20 1903 204 lb (92.5 kg)     Height 11/06/20 1903 _0  (1.727 m)     Head Circumference --      Peak Flow --      Pain Score 11/06/20 1903 8   Constitutional: Alert and oriented.  Eyes: Conjunctivae are normal.  ENT      Head: Normocephalic and atraumatic.      Nose: No congestion/rhinnorhea.      Mouth/Throat: Mucous membranes are moist.      Neck: No stridor. Hematological/Lymphatic/Immunilogical: No cervical lymphadenopathy. Cardiovascular: Normal rate, regular rhythm.  No murmurs, rubs, or gallops.  Respiratory: Normal respiratory effort without tachypnea nor retractions. Breath sounds are clear and equal bilaterally. No wheezes/rales/rhonchi. Gastrointestinal: Soft. Tender to palpation in the lower abdomen.  Genitourinary:  Deferred Musculoskeletal: Normal  range of motion in all extremities. No lower extremity edema. Neurologic:  Normal speech and language. No gross focal neurologic deficits are appreciated.  Skin:  Skin is warm, dry and intact. No rash noted. Psychiatric: Mood and affect are normal. Speech and behavior are normal. Patient exhibits appropriate insight and judgment.  ____________________________________________    LABS (pertinent positives/negatives)  Lipase 61 CBC wbc 11.3, hgb 11.6, plt 178 CMP na 138, k 5.1, glu 217, cr 10.88  ____________________________________________   EKG  I, Nance Pear, attending physician, personally viewed and interpreted this EKG  EKG Time: 1907 Rate: 92 Rhythm: sinus rhythm Axis: left axis deviation Intervals: qtc 457 QRS: LAFB ST changes: no st elevation Impression: abnormal ekg  ____________________________________________    RADIOLOGY  CT abd/pel No acute abnormality   ____________________________________________   PROCEDURES  Procedures  ____________________________________________   INITIAL IMPRESSION / ASSESSMENT AND PLAN / ED COURSE  Pertinent labs & imaging results that were available during my care of the patient were reviewed by me and considered in my medical decision making (see chart for details).   Presented to the emergency department today because of concerns for abdominal pain, nausea vomiting after eating today. Blood work without any concerning leukocytosis. Given abdominal pain nausea vomiting a CT scan was obtained to evaluate for any possible obstruction or significant intra-abdominal infection. CT did not show any acute findings. I do find this reassuring. Patient did feel better after antiemetic. At this point do wonder if patient suffering from acute food poisoning. Will give patient prescription for antiemetics. Discussed findings and plan with patient.    ____________________________________________   FINAL CLINICAL IMPRESSION(S) / ED DIAGNOSES  Final diagnoses:  Abdominal pain, unspecified abdominal location  Nausea     Note: This dictation was prepared with Dragon dictation. Any transcriptional errors that result from this process are unintentional     Nance Pear, MD 11/06/20 2140

## 2020-11-06 NOTE — ED Triage Notes (Addendum)
Pt arrives via EMS from [facility]- pt missed dialysis yesterday after they could not feel or hear thrill/bruit- pt now has abdominal pain has vomited x3 since this afternoon- pt states it started after he ate a slice of apple pie at lunch- pt denies diarrhea- pt has appointment with cards tomorrow- pt was given zofran by ems

## 2020-11-06 NOTE — Discharge Instructions (Addendum)
Please seek medical attention for any high fevers, chest pain, shortness of breath, change in behavior, persistent vomiting, bloody stool or any other new or concerning symptoms.  

## 2020-11-21 IMAGING — CT CT CHEST W/O CM
1 series · 13 of 34 positions shown, 17 images · IV contrast (omnipaque)
Comparison: None.

CLINICAL DATA: Unintended weight loss.

EXAM:
CT CHEST, ABDOMEN, AND PELVIS WITH CONTRAST
TECHNIQUE: Multidetector CT imaging of the chest, abdomen and pelvis was
performed following the standard protocol during bolus
administration of intravenous contrast.
CONTRAST:  100mL OMNIPAQUE IOHEXOL 300 MG/ML  SOLN

[Series 2: thorax · axial · 0.80mm/px · z∈[-295,-41]mm · 13 of 149 slices shown, 17 images]
[im 11/149  mediastinal]
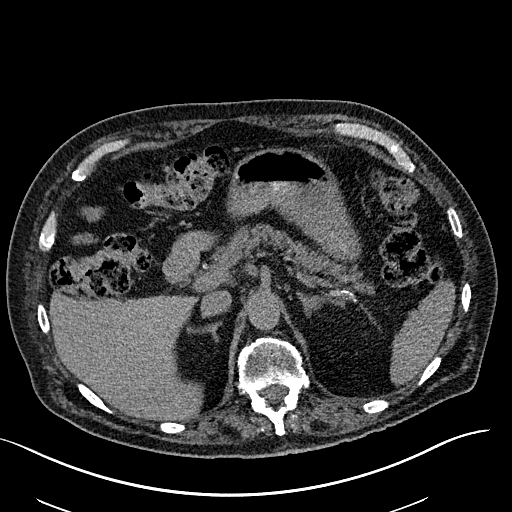
[im 11/149  lung]
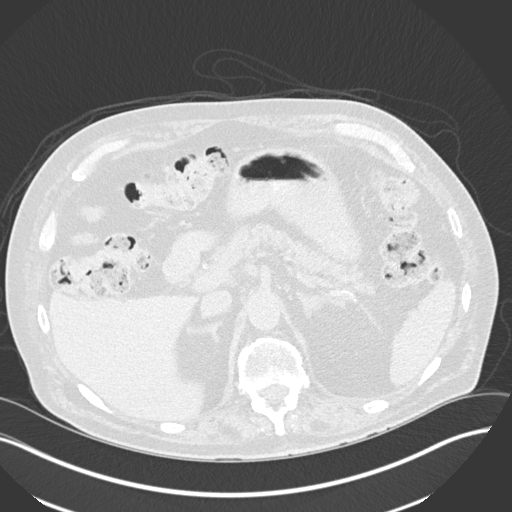
[im 22/149  lung]
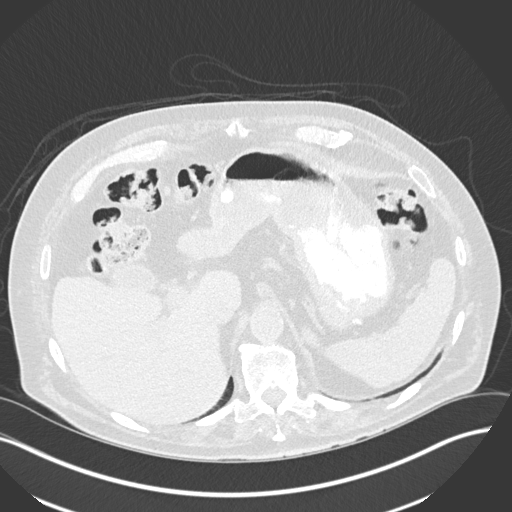
[im 33/149  lung]
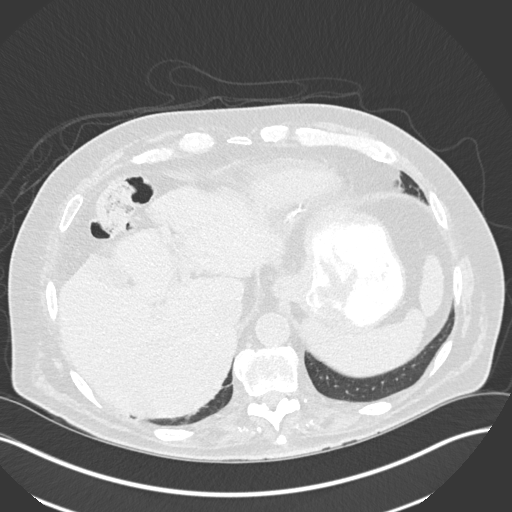
[im 44/149  lung]
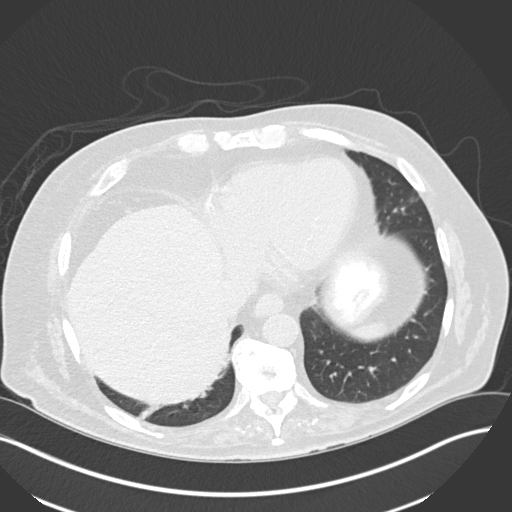
[im 60/149  mediastinal]
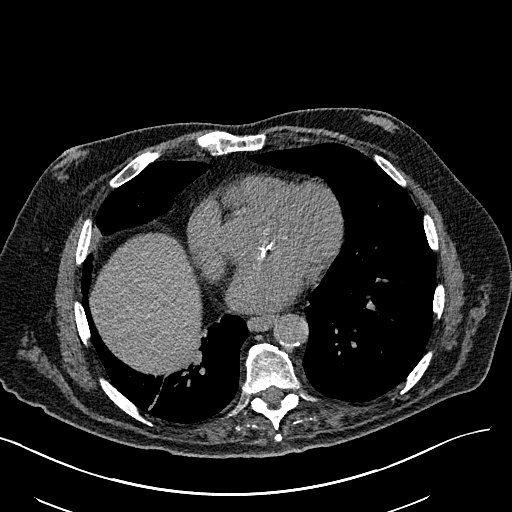
[im 60/149  lung]
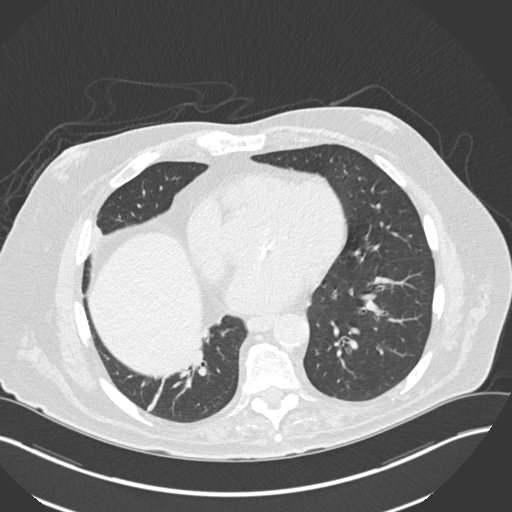
[im 66/149  lung]
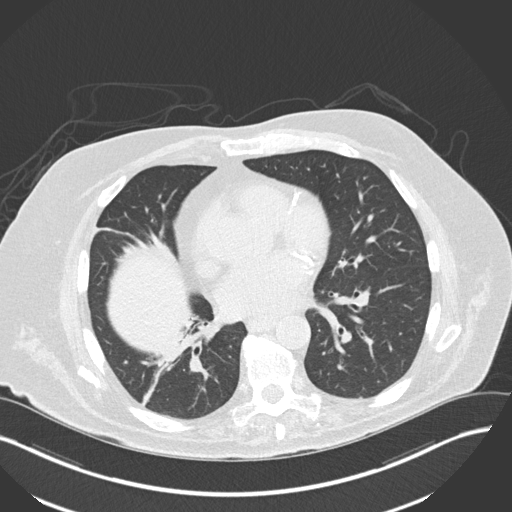
[im 77/149  lung]
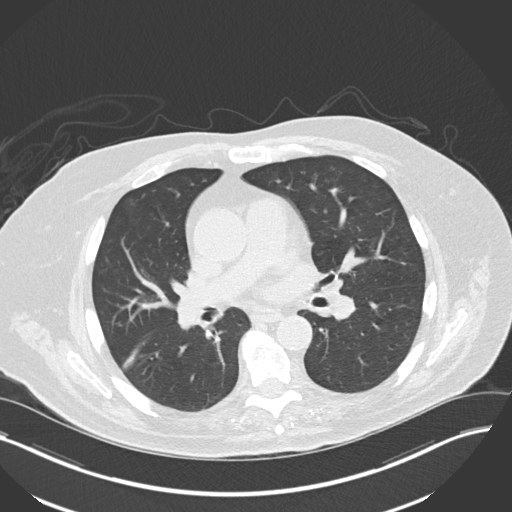
[im 83/149  lung]
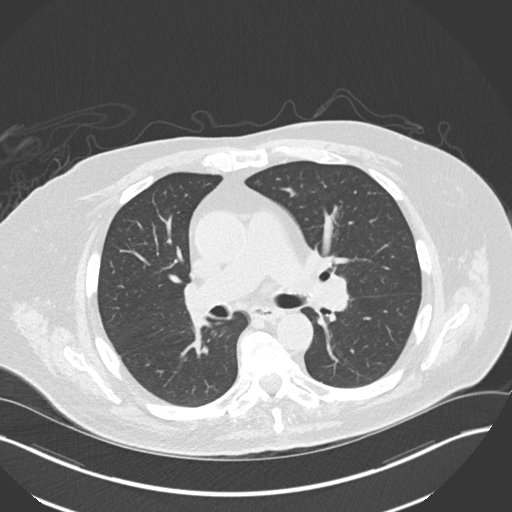
[im 89/149  mediastinal]
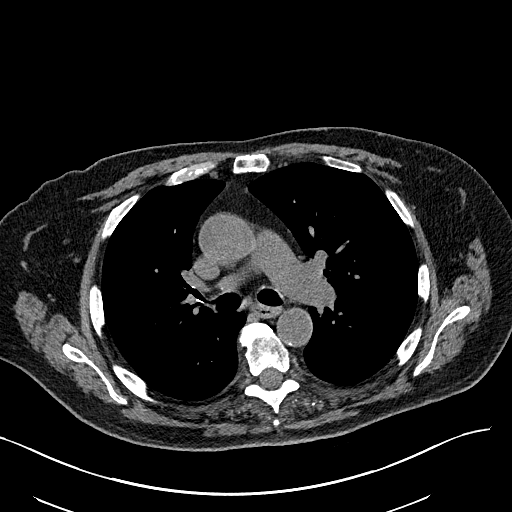
[im 89/149  lung]
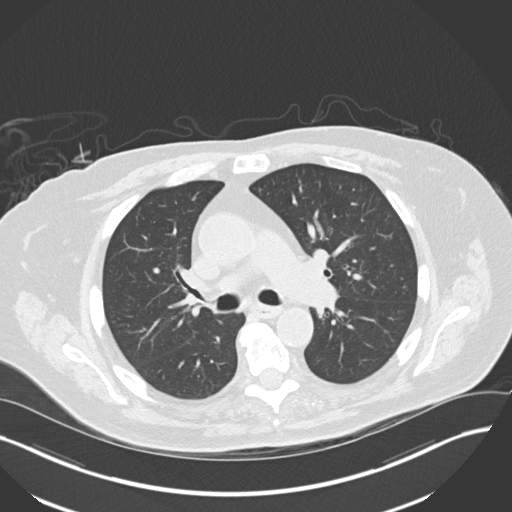
[im 105/149  lung]
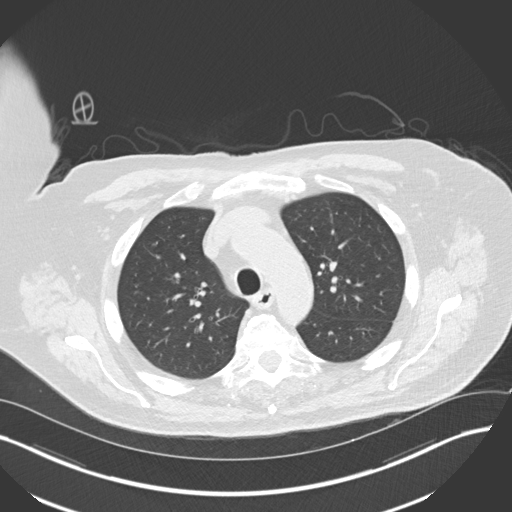
[im 116/149  lung]
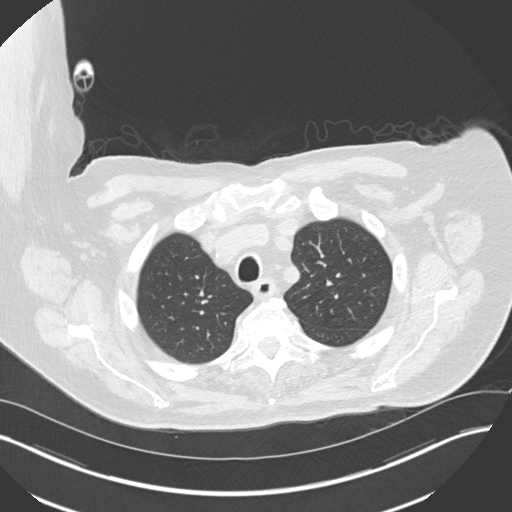
[im 127/149  lung]
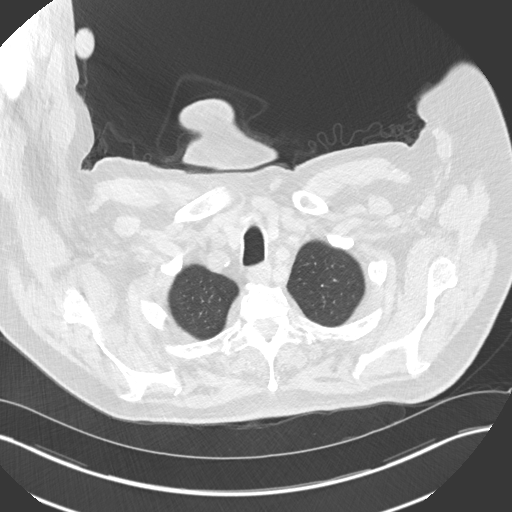
[im 138/149  mediastinal]
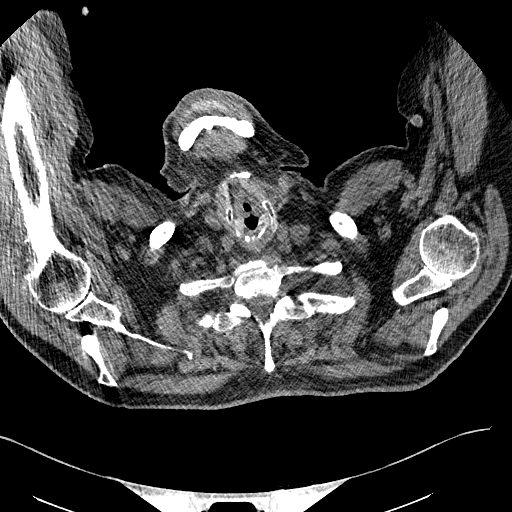
[im 138/149  lung]
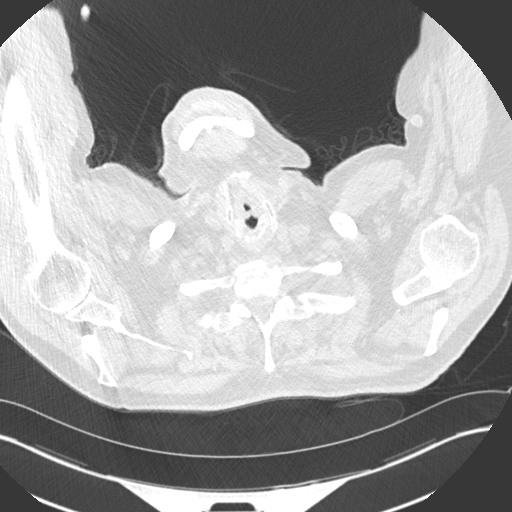

[13 of 34 positions shown; findings below may reference images not displayed]

FINDINGS: CT CHEST FINDINGS

Cardiovascular: The heart size is normal. No substantial pericardial
effusion. Coronary artery calcification is evident. Atherosclerotic
calcification is noted in the wall of the thoracic aorta. Ascending
thoracic aorta measures 4.2 cm diameter.

Mediastinum/Nodes: No mediastinal lymphadenopathy. There is no
axillary lymphadenopathy. No evidence for gross hilar
lymphadenopathy although assessment is limited by the lack of
intravenous contrast on today's study. The esophagus has normal
imaging features.

Lungs/Pleura: Centrilobular emphsyema noted. Bilateral tiny
pulmonary nodules are identified, measuring up to about 3 mm and
most of which appear calcified (representative example central left
upper lobe on 56/3). No overtly suspicious nodule or mass. There is
some atelectasis in the right base with asymmetric elevation right
hemidiaphragm. No pleural effusion.

Musculoskeletal: No worrisome lytic or sclerotic osseous
abnormality. Mild bilateral gynecomastia noted.

CT ABDOMEN PELVIS FINDINGS

Hepatobiliary: No suspicious focal abnormality within the liver
parenchyma. There is no evidence for gallstones, gallbladder wall
thickening, or pericholecystic fluid. No intrahepatic or
extrahepatic biliary dilation.

Pancreas: No focal mass lesion. No dilatation of the main duct. No
intraparenchymal cyst. No peripancreatic edema.

Spleen: No splenomegaly. No focal mass lesion.

Adrenals/Urinary Tract: No adrenal nodule or mass. Cortical scarring
noted in the kidneys bilaterally. No worrisome

Renal mass lesion. No hydronephrosis. No evidence for hydroureter.
Bladder is incompletely distended which likely accentuates the
circumferential bladder wall thickening. Soft tissue projecting in
the lumen of the posterior bladder base likely reflects prostate
hypertrophy.

Stomach/Bowel: Stomach is unremarkable. No gastric wall thickening.
No evidence of outlet obstruction. Duodenum is normally positioned
as is the ligament of Treitz. Small duodenal diverticulum noted. No
small bowel wall thickening. No small bowel dilatation. The terminal
ileum is normal. The appendix is normal. No gross colonic mass. No
colonic wall thickening.

Vascular/Lymphatic: There is abdominal aortic atherosclerosis
without aneurysm. 11 mm short axis hepato duodenal ligament lymph
node on [DATE] is upper normal to mildly increased. No retroperitoneal
lymphadenopathy. No pelvic sidewall lymphadenopathy.

Reproductive: Prostate gland is enlarged.

Other: No intraperitoneal free fluid.

Musculoskeletal: No worrisome lytic or sclerotic osseous
abnormality.
IMPRESSION: 1. No acute findings in the chest, abdomen, or pelvis. Specifically,
no findings to explain the patient's history of weight loss
2. Tiny bilateral pulmonary nodules, many of which are calcified. No
follow-up needed if patient is low-risk (and has no known or
suspected primary neoplasm). Non-contrast chest CT can be considered
in 12 months if patient is high-risk. This recommendation follows
the consensus statement: Guidelines for Management of Incidental
Pulmonary Nodules Detected on CT Images: From the [HOSPITAL]
3. Prostatomegaly. Mild circumferential wall thickening is noted in
the under distended bladder and a component of bladder outlet
obstruction not excluded. Soft tissue projects into the lumen of the
inferior posterior bladder likely related to prostate hypertrophy
but urothelial lesion not excluded.
4. Aortic Atherosclerosis (UKR5X-L84.4) and Emphysema (UKR5X-T32.D).

## 2021-01-02 ENCOUNTER — Ambulatory Visit: Payer: Medicare Other | Admitting: Adult Health

## 2021-01-02 NOTE — Progress Notes (Deleted)
[facility]    Provider:  Dr Jaynee [name] Requesting Provider: Bonnita Nasuti, MD Primary Care Provider:  Bonnita Nasuti, MD  CC:  Stroke, follow up from hospital  HPI:   Today, 01/02/2021, Frank [name] returns for 66-monthstroke follow-up accompanied by his daughter [facility]  Stable from stroke standpoint without new stroke/TIA symptoms Cognitive impairment stable per report.  Remains on Aricept 5 mg daily tolerating without side effects  Compliant on aspirin, Brilinta and atorvastatin without associated side effects Blood pressure today ***.  Continues on midodrine TID and PRN during HD sessions     History provided for reference purposes only Update 06/29/2020 JM: [name] accompanied by his daughter who provides majority of history.  Stable from stroke standpoint without new or reoccurring stroke/TIA symptoms.  Cognition has been stable without worsening and he continues to reside at [facility].  Remains on aspirin and Brilinta with mild bruising but no bleeding.  Continues on atorvastatin 20 mg daily without myalgias.  Blood pressure today 97/62 currently on midodrine 3 times daily and as needed during HD sessions.  He did have carotid ultrasound completed today ordered by Frank. DEstanislado Pandywhich showed right ICA stent <50% stenosis and left ICA 1 to 39% stenosis.  No concerns at this time.  Initial visit 12/23/2019 Frank. AHart [place]is a 80y.o. male here as requested by [name] MD for hospital follow up. PMHx thyroid disease, secondary hyperparathyroidism, end-stage renal disease on dialysis, peripheral vascular disease, peripheral neuropathy, insomnia, illiterate, hyperlipidemia, hypertension, diabetic nephropathy, diabetes mellitus, depression, chronic kidney disease, gout, bradycardia, anemia of chronic kidney disease, coronary artery disease, cerebral embolism with cerebral infarction  and stenosis of the right carotid artery.  I reviewed notes from the hospital, patient presented on January 27 of this year for facial droop and altered mental status, family found him slumped over and having difficulty walking, legs weak for about a week, code stroke was initiated however he was outside the window for IV TPA.  His CTA head and neck showed a 75% stenosis of the right ICA.  MRI of the brain (personally reviewed images) showed multiple bilateral scattered infarcts in the watershed territories secondary to hypoperfusion in the setting of chronic hypotension and right ICA high-grade stenosis, status post stenting of the right ICA, he was started on aspirin and Plavix.  Chronic hypotension, on midodrine, he was already on Lipitor and his LDL was 35, his hemoglobin A1c was 6.7 which was within goal.  Here with his daughter who also provides much information. He feels he is improved. He has always had some stuttering that worsened after his stroke but now improved back to baseline. He is using a walker. He has dialysis 3x weekly. He has a walker and walks well. He feels weaker after dialysis. He is living in assisted living. [facility] [place] [facility], He wants to drive and we discussed I do not recommend that, he has memory changes likely dementia, daughter says his memory is worsening, he perseverates on subjects, now getting PT at assisted living and OT and speech too. Asiisted living managing medications. [facility] [place] [facility] [place] [facility] but now [facility] is working with him. He is at [facility] and we discussed given his cognitive status, his physical status, multiple complicated comorbidities and stroke, I do recommend that he stays in assisted living  and I discussed there is no driving.  Patient was disappointed he still wants to drive, I did explain that it was not safe for him or for other people on the road to have him driving.  But I did try to encourage  him that transportation is available to him.  He is adjusting to his new living environment.  He is starting to see a new primary care doctor at [facility] assisted [facility], she saw him today and daughter said they liked her very much and they discussed changing medications for anxiety.  We also discussed today his blood pressure is low systolic around 185, given his watershed infarcts and his atherosclerosis I do recommend that his blood pressure stay above 120 and he continues the midodrine 3 times a day.  For his anxiety they can modify his trazodone, we could try a very very low-dose as needed benzodiazepine but I will defer to his new doctor at [facility] assisted living for this.  We discussed fall risks.  We discussed managing his vascular risk factors, and I am very happy that he has so much support between his daughter and his assisted living facility.  His daughter appears to be quite caring and knowledgeable.  He is eating well without swallowing difficulties.  No pain.  Reviewed notes, labs and imaging from outside physicians, which showed:  MRI of the brain (personally reviewed imaging) and agree with the following: 1. 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 No associated hemorrhage or mass effect. 2. Underlying moderately advanced age-related cerebral atrophy with mild chronic small vessel ischemic disease  Review of Systems: Patient complains of symptoms per HPI.  Pertinent negatives and positives per HPI. All others negative.   Social History   Socioeconomic History  . Marital status: Divorced    Spouse name: Not on file  . Number of children: 4  . Years of education: Not on file  . Highest education level: 8th grade  Occupational History  . Not on file  Tobacco Use  . Smoking status: Former Smoker    Years: 30.00    Quit date: 1994    Years since  quitting: 28.3  . Smokeless tobacco: Never Used  Vaping Use  . Vaping Use: Never used  Substance and Sexual Activity  . Alcohol use: No  . Drug use: No  . Sexual activity: Not Currently  Other Topics Concern  . Not on file  Social History Narrative   4 kids (3 daughters and 1 son)   Daughter [name] DPR      Lives at [facility]    Social Determinants of Health   Financial Resource Strain: Not on file  Food Insecurity: Not on file  Transportation Needs: Not on file  Physical Activity: Not on file  Stress: Not on file  Social Connections: Not on file  Intimate Partner Violence: Not on file    Family History  Problem Relation Age of Onset  . Diabetes Mother   . Kidney disease Mother   . Liver disease Mother   . Heart Problems Mother   . Stroke Neg Hx     Past Medical History:  Diagnosis Date  . Anemia of chronic kidney failure   . Arthritis   . Bradycardia   . Cataracts, bilateral 05/15/2012  . Chronic idiopathic gout of multiple sites 02/10/2015  . Chronic kidney disease    [facility]  . Depression   .  Diabetes mellitus without complication (HCC)    diet controlled   . Diabetic nephropathy ([place])   . ED (erectile dysfunction) 08/28/2012  . Elevated liver enzymes 06/10/2019  . Epiretinal membrane (ERM) of right eye 06/10/2019   Right eye saw AE 06/05/2019    . Essential hypertension 05/07/2013   Last Assessment & Plan:  Formatting of this note might be different from the original. HTN PLAN   BP goal is <140/90 BP Readings from Last 3 Encounters:  08/27/16 140/79  08/13/16 141/80  08/06/16 144/86   Current Anti-Hyptensive Medications: None  Today's Recommendations: Continue lifestyle modifications. Defer starting medication to PCP.  Recommend continued regular exercise as tolerated. Recomm  . History of UTI    s/p foley in hospital 01/2019   . Hyperlipidemia   . Hypothyroidism   . Illiterate 11/19/2013   Last Assessment & Plan:  Assisted patient  with Handicapped Placard application today.    . Insomnia 05/08/2019  . Neuropathy   . Peripheral neuropathy   . Peripheral vascular disease ([place])   . Second degree heart block   . Secondary hyperparathyroidism ([place])    Renal  . Thyroid disease     Patient Active Problem List   Diagnosis Date Noted  . Diarrhea, unspecified 01/26/2020  . Allergic rhinitis, unspecified 12/02/2019  . Fluid overload, unspecified 12/02/2019  . Atrioventricular block, Mobitz type 1, Wenckebach   . Left-sided weakness   . Cerebrovascular accident (CVA) due to embolism of right carotid artery ([place])   . Stenosis of right carotid artery   . Arterial hypotension   . Cerebral embolism with cerebral infarction 10/08/2019  . Slurred speech 10/07/2019  . Epiretinal membrane (ERM) of right eye 06/10/2019  . Elevated liver enzymes 06/10/2019  . Benign prostatic hyperplasia 05/08/2019  . Insomnia 05/08/2019  . Coronary artery disease involving native coronary artery of native heart without angina pectoris 05/08/2019  . Atherosclerosis 05/08/2019  . Hyperphosphatemia 05/08/2019  . Secondary hyperparathyroidism ([place])   . Hyperlipidemia   . Gout   . Anemia of chronic kidney failure   . Near syncope 01/21/2019  . Elevated troponin 01/21/2019  . Osteoarthritis of midfoot, right 08/13/2016  . Foot swelling 07/04/2016  . DM type 2 with diabetic peripheral neuropathy ([place]) 11/28/2015  . Pre-transplant evaluation for kidney transplant 11/28/2015  . HLD (hyperlipidemia) 08/01/2015  . 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 Chronic idiopathic gout of multiple sites 02/10/2015  . Illiterate 11/19/2013  . ESRD (end stage renal disease) ([place]) 05/07/2013  . Essential hypertension 05/07/2013  . Osteoarthritis 05/07/2013  . Diastasis recti 10/29/2012  . 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 ED (erectile dysfunction)  08/28/2012  . Cataracts, bilateral 05/15/2012  . Chronic pain 05/15/2012  . DM (diabetes mellitus) ([place]) 05/15/2012  . Gout of knee 05/15/2012  . Hyperkalemia 05/15/2012  . Hypothyroidism 05/15/2012    Past Surgical History:  Procedure Laterality Date  . AV FISTULA PLACEMENT Left 11/19/2017   Procedure: ARTERIOVENOUS (AV) FISTULA CREATION;  Surgeon: [name], MD;  Location: [facility];  Service: Vascular;  Laterality: Left;  . BASCILIC VEIN TRANSPOSITION Left 06/04/2018   Procedure: BASILIC VEIN TRANSPOSITION ARM;  Surgeon: [name], MD;  Location: [facility];  Service: Vascular;  Laterality: Left;  . COLONOSCOPY    . DIALYSIS/PERMA CATHETER REMOVAL N/A 03/23/2019   Procedure: DIALYSIS/PERMA CATHETER REMOVAL;  Surgeon: [name], MD;  Location: [facility];  Service: Cardiovascular;  Laterality: N/A;  . EYE SURGERY     cataract surgery b/l; photophobia since   . INSERTION OF DIALYSIS CATHETER N/A 01/23/2019   Procedure: INSERTION OF DIALYSIS CATHETER;  Surgeon: [name], MD;  Location: [facility];  Service: Vascular;  Laterality: N/A;  INSERTION OF DIALYSIS CATHETER  . IR ANGIO INTRA EXTRACRAN SEL COM CAROTID INNOMINATE UNI L MOD SED  10/15/2019  . IR ANGIO VERTEBRAL SEL SUBCLAVIAN INNOMINATE UNI R MOD SED  10/15/2019  . IR CT HEAD LTD  10/15/2019  . IR FLUORO GUIDE CV LINE RIGHT  10/08/2019  . IR INTRAVSC STENT CERV CAROTID W/EMB-PROT MOD SED INCL ANGIO  10/15/2019  . IR THROMBECTOMY AV FISTULA W/THROMBOLYSIS/PTA INC/SHUNT/IMG LEFT Left 10/09/2019  . IR US GUIDE VASC ACCESS LEFT  10/09/2019  . IR US GUIDE VASC ACCESS RIGHT  10/08/2019  . KNEE ARTHROSCOPY Left    Knee  . PERIPHERAL VASCULAR THROMBECTOMY Left 07/06/2019   Procedure: PERIPHERAL VASCULAR THROMBECTOMY;  Surgeon: [name], MD;  Location: [facility];  Service: Cardiovascular;  Laterality: Left;  . RADIOLOGY WITH ANESTHESIA N/A 10/15/2019   Procedure: stent placement;  Surgeon:  Luanne Bras, MD;  Location: [facility];  Service: Radiology;  Laterality: N/A;  . THROMBECTOMY W/ EMBOLECTOMY Left 01/27/2019   Procedure: THROMBECTOMY Left arm ARTERIOVENOUS FISTULA  and Left arm Arteriovenous gortex graft;  Surgeon: [name], MD;  Location: [facility];  Service: Vascular;  Laterality: Left;  Marland Kitchen VENOGRAM Left 01/27/2019   Procedure: Left arm FISTULOGRAM/;  Surgeon: [name], MD;  Location: [facility];  Service: Vascular;  Laterality: Left;    Current Outpatient Medications  Medication Sig Dispense Refill  . acetaminophen (TYLENOL) 325 MG tablet Take 325 mg by mouth every 6 (six) hours as needed. 372m po q4hrs    . allopurinol (ZYLOPRIM) 100 MG tablet Take 1 tablet (100 mg total) by mouth daily. 90 tablet 3  . ALPRAZolam (XANAX) 0.25 MG tablet BID PRN    . alum & mag hydroxide-simeth (MAALOX/MYLANTA) 200-200-20 MG/5ML suspension Take 5 mLs by mouth every 6 (six) hours as needed for indigestion or heartburn.    . Amino Acids-Protein Hydrolys (FEEDING SUPPLEMENT, PRO-STAT SUGAR FREE 64,) LIQD Take 30 mLs by mouth daily.     .Marland Kitchenaspirin EC 81 MG tablet Take 81 mg daily by mouth.    .Marland Kitchenatorvastatin (LIPITOR) 20 MG tablet Take 20 mg by mouth daily at 6 PM.     . benzonatate (TESSALON) 200 MG capsule Take 200 mg by mouth 3 (three) times daily as needed for cough.    . bisacodyl (DULCOLAX) 10 MG suppository Place 10 mg rectally as needed for moderate constipation.    . bismuth subsalicylate (PEPTO BISMOL) 262 MG/15ML suspension Take 15 mLs by mouth every 6 (six) hours as needed. q2 hrs PRN up to 6 doses in 24hrs    . blood glucose meter kit and supplies Dispense based on patient and insurance preference. Use up to two times daily as directed. (FOR ICD-10 E10.9, E11.9). STRIPS and LANCETS 1 year supply 1 each 0  . calcitRIOL (ROCALTROL) 0.5 MCG capsule Take 1 capsule (0.5 mcg total) by mouth Every Tuesday,Thursday,and Saturday with dialysis. 30 capsule 1  . clotrimazole (LOTRIMIN)  1 % cream Apply 1 application topically daily as needed (For yeast, groin rash).    .Marland Kitchendocusate sodium (COLACE) 100 MG capsule Take 200 mg by mouth daily as needed.     . donepezil (  ARICEPT) 5 MG tablet Take 1 tablet (5 mg total) by mouth at bedtime. 30 tablet 6  . guaifenesin (ROBITUSSIN) 100 MG/5ML syrup Take 100 mg by mouth 3 (three) times daily as needed for cough. Get 52m q6hr PRN    . ketoconazole (NIZORAL) 2 % shampoo Apply 1 application topically once. On Monday and Thursday    . levothyroxine (SYNTHROID) 175 MCG tablet Take 1 tablet (175 mcg total) by mouth daily before breakfast. 30 tablet 0  . lidocaine-prilocaine (EMLA) cream Apply 1 application topically See admin instructions. Apply to access site 1 to 2 hours before dialysis. Cover with occlusive dressing.On Tuesday,Thursday and Saturday    . loperamide (IMODIUM) 2 MG capsule Take 2 mg by mouth as needed for diarrhea or loose stools.    . magnesium hydroxide (MILK OF MAGNESIA) 400 MG/5ML suspension Take 5 mLs by mouth daily as needed for mild constipation. 30cc 2 tablespoons full  By mounth as needed for one dose    . Melatonin 3 MG CAPS Take by mouth. One tab po at bed time- Taking 157m   . midodrine (PROAMATINE) 10 MG tablet Take 1 tablet (10 mg total) by mouth 3 (three) times daily with meals. (Patient taking differently: Take 10 mg by mouth See admin instructions. With meals. Administer 1/2 hour before dialysis on Tuesday, Thursday, Saturday.) 270 tablet 3  . Multiple Vitamin (MULTIVITAMIN WITH MINERALS) TABS tablet Take 1 tablet by mouth daily.    . ondansetron (ZOFRAN) 4 MG tablet Take 1 tablet (4 mg total) by mouth every 8 (eight) hours as needed. 20 tablet 0  . pantoprazole (PROTONIX) 40 MG tablet Take 1 tablet (40 mg total) by mouth daily. 30 tablet 1  . polyethylene glycol (MIRALAX / GLYCOLAX) 17 g packet Take 17 g by mouth daily. 14 each 0  . senna (SENOKOT) 8.6 MG TABS tablet Take 1 tablet by mouth.    . sevelamer  carbonate (RENVELA) 800 MG tablet Take 3 tablets (2,400 mg total) by mouth 3 (three) times daily with meals. 120 tablet 0  . sodium phosphate Pediatric (FLEET) 3.5-9.5 GM/59ML enema Place 1 enema rectally once. PRN    . tamsulosin (FLOMAX) 0.4 MG CAPS capsule Take 1 capsule (0.4 mg total) by mouth daily. (Patient taking differently: Take 0.8 mg by mouth daily. 2 capsule) 30 capsule 1  . ticagrelor (BRILINTA) 90 MG TABS tablet Take 1 tablet (90 mg total) by mouth 2 (two) times daily. 60 tablet 3  . traZODone (DESYREL) 50 MG tablet Take 0.5 tablets (25 mg total) by mouth at bedtime as needed for sleep. 90 tablet 3  . Zinc Oxide (BALMEX EX) Apply 1 application topically daily as needed (to irritated areas of skin).      No current facility-administered medications for this visit.    Allergies as of 01/02/2021 - Review Complete 11/06/2020  Allergen Reaction Noted  . Ace inhibitors Other (See Comments) 05/15/2012  . Angiotensin receptor blockers Other (See Comments) 05/15/2012  . Ivp dye [iodinated diagnostic agents] Other (See Comments) 01/21/2019  . Adhesive [tape] Other (See Comments) 01/21/2019    Vitals: There were no vitals filed for this visit. There is no height or weight on file to calculate BMI.  Physical exam: General: well developed, well nourished,  very pleasant elderly Caucasian male, seated, in no evident distress Head: head normocephalic and atraumatic.   Neck: supple with no carotid or supraclavicular bruits Cardiovascular: regular rate and rhythm, no murmurs Musculoskeletal: no deformity Skin:  no  rash/petichiae Vascular:  Normal pulses all extremities   Neurologic Exam Mental Status: Awake and fully alert.   Mild to moderate stuttering but no evidence of aphasia or dysarthria.  Oriented to place and time. Recent memory impaired and remote memory intact. Attention span, concentration and fund of knowledge appropriate during visit. Mood and affect appropriate.  Cranial  Nerves: Pupils equal, briskly reactive to light. Extraocular movements full without nystagmus. Visual fields full to confrontation. Hearing intact. Facial sensation intact. Face, tongue, palate moves normally and symmetrically.  Motor: Normal bulk and tone. Normal strength in all tested extremity muscles. Sensory.: intact to touch , pinprick , position and vibratory sensation.  Coordination: Rapid alternating movements normal in all extremities. Finger-to-nose and heel-to-shin performed accurately bilaterally. Gait and Station: Arises from chair without difficulty. Stance is normal. Gait demonstrates normal stride length and balance Reflexes: 1+ and symmetric. Toes downgoing.      Assessment/Plan:  80 y.o. male with history of multiple scattered infarcts in setting of hypoperfusion with right ICA stenosis s/p stent 10/2019 with residual cognitive impairment.  He was initially seen in office by Frank [name] as requested by his PCP.  PMHx significant for ESRD on HD, PVD, peripheral neuropathy, insomnia, HTN, HLD, DM, CAD, and anemia of chronic disease   Hx of prior strokes -Continue aspirin and atorvastatin for secondary stroke prevention -Discussed secondary stroke prevention measures and importance of close PCP follow-up for aggressive stroke risk factor management -HTN: BP goal<130/90 w/ importance of avoiding hypotension. Use of midodrine TID PRN -HLD: LDL goal<70.  On atorvastatin 20 mg daily per PCP -DM: A1c goal<7  R carotid stenosis S/p stent 10/2019 Routinely followed by Frank. Estanislado [name] Carotid duplex 06/2020 R ICA stent <50% stenosis - is due for repeat ultrasound  Vascular cognitive impairment -Stable on Aricept 5 mg nightly     Follow-up in 6 months or call earlier if needed   CC:  [facility] provider: Dr. Kathleene Hazel, Rosalyn Charters, MD    I spent 30 minutes of face-to-face and non-face-to-face time with patient and daughter.  This included previsit chart review, lab review, study review,  order entry, electronic health record documentation, patient education and discussion regarding history of stroke, right ICA stenosis s/p stent and review of recent ultrasound, ongoing use of above medications, importance of managing stroke risk factors and answered all questions to patient and daughter satisfaction  Frann Rider, AGNP-BC  [facility] [address] [place], [place] [address]  Phone [phone] Fax [phone] Note: This document was prepared with digital dictation and possible smart phrase technology. Any transcriptional errors that result from this process are unintentional.

## 2021-01-10 ENCOUNTER — Emergency Department
Admission: EM | Admit: 2021-01-10 | Discharge: 2021-01-10 | Disposition: A | Discharge status: Home / Self Care | Payer: Medicare Other | Attending: Emergency Medicine | Admitting: Emergency Medicine

## 2021-01-10 ENCOUNTER — Other Ambulatory Visit: Payer: Self-pay

## 2021-01-10 ENCOUNTER — Other Ambulatory Visit (HOSPITAL_COMMUNITY): Payer: Self-pay | Admitting: Interventional Radiology

## 2021-01-10 ENCOUNTER — Emergency Department: Payer: Medicare Other

## 2021-01-10 DIAGNOSIS — E1122 Type 2 diabetes mellitus with diabetic chronic kidney disease: Secondary | ICD-10-CM | POA: Diagnosis not present

## 2021-01-10 DIAGNOSIS — N186 End stage renal disease: Secondary | ICD-10-CM | POA: Insufficient documentation

## 2021-01-10 DIAGNOSIS — E039 Hypothyroidism, unspecified: Secondary | ICD-10-CM | POA: Diagnosis not present

## 2021-01-10 DIAGNOSIS — E1140 Type 2 diabetes mellitus with diabetic neuropathy, unspecified: Secondary | ICD-10-CM | POA: Insufficient documentation

## 2021-01-10 DIAGNOSIS — Z992 Dependence on renal dialysis: Secondary | ICD-10-CM | POA: Diagnosis not present

## 2021-01-10 DIAGNOSIS — R110 Nausea: Secondary | ICD-10-CM | POA: Diagnosis not present

## 2021-01-10 DIAGNOSIS — I120 Hypertensive chronic kidney disease with stage 5 chronic kidney disease or end stage renal disease: Secondary | ICD-10-CM | POA: Diagnosis not present

## 2021-01-10 DIAGNOSIS — Z7982 Long term (current) use of aspirin: Secondary | ICD-10-CM | POA: Insufficient documentation

## 2021-01-10 DIAGNOSIS — Z87891 Personal history of nicotine dependence: Secondary | ICD-10-CM | POA: Diagnosis not present

## 2021-01-10 DIAGNOSIS — I2510 Atherosclerotic heart disease of native coronary artery without angina pectoris: Secondary | ICD-10-CM | POA: Insufficient documentation

## 2021-01-10 DIAGNOSIS — R109 Unspecified abdominal pain: Secondary | ICD-10-CM | POA: Insufficient documentation

## 2021-01-10 DIAGNOSIS — Z79899 Other long term (current) drug therapy: Secondary | ICD-10-CM | POA: Diagnosis not present

## 2021-01-10 DIAGNOSIS — I6521 Occlusion and stenosis of right carotid artery: Secondary | ICD-10-CM

## 2021-01-10 LAB — COMPREHENSIVE METABOLIC PANEL
ALT: 17 U/L (ref 0–44)
AST: 27 U/L (ref 15–41)
Albumin: 4.3 g/dL (ref 3.5–5.0)
Alkaline Phosphatase: 103 U/L (ref 38–126)
Anion gap: 14 (ref 5–15)
BUN: 84 mg/dL — ABNORMAL HIGH (ref 8–23)
CO2: 25 mmol/L (ref 22–32)
Calcium: 9.6 mg/dL (ref 8.9–10.3)
Chloride: 98 mmol/L (ref 98–111)
Creatinine, Ser: 9.65 mg/dL — ABNORMAL HIGH (ref 0.61–1.24)
GFR, Estimated: 5 mL/min — ABNORMAL LOW (ref >60)
Glucose, Bld: 157 mg/dL — ABNORMAL HIGH (ref 70–99)
Potassium: 5.5 mmol/L — ABNORMAL HIGH (ref 3.5–5.1)
Sodium: 137 mmol/L (ref 135–145)
Total Bilirubin: 0.8 mg/dL (ref 0.3–1.2)
Total Protein: 8.2 g/dL — ABNORMAL HIGH (ref 6.5–8.1)

## 2021-01-10 LAB — URINALYSIS, COMPLETE (UACMP) WITH MICROSCOPIC
Bacteria, UA: NONE SEEN
Bilirubin Urine: NEGATIVE
Glucose, UA: 50 mg/dL — AB
Ketones, ur: NEGATIVE mg/dL
Nitrite: NEGATIVE
Protein, ur: 100 mg/dL — AB
Specific Gravity, Urine: 1.012 (ref 1.005–1.030)
pH: 6 (ref 5.0–8.0)

## 2021-01-10 LAB — CBC
HCT: 37 % — ABNORMAL LOW (ref 39.0–52.0)
Hemoglobin: 12 g/dL — ABNORMAL LOW (ref 13.0–17.0)
MCH: 31.3 pg (ref 26.0–34.0)
MCHC: 32.4 g/dL (ref 30.0–36.0)
MCV: 96.6 fL (ref 80.0–100.0)
Platelets: 170 10*3/uL (ref 150–400)
RBC: 3.83 MIL/uL — ABNORMAL LOW (ref 4.22–5.81)
RDW: 14.5 % (ref 11.5–15.5)
WBC: 11.1 10*3/uL — ABNORMAL HIGH (ref 4.0–10.5)
nRBC: 0 % (ref 0.0–0.2)

## 2021-01-10 LAB — LIPASE, BLOOD: Lipase: 58 U/L — ABNORMAL HIGH (ref 11–51)

## 2021-01-10 MED ORDER — HYDROCODONE-ACETAMINOPHEN 5-325 MG PO TABS
2.0000 | ORAL_TABLET | Freq: Once | ORAL | Status: AC
  Administered 2021-01-10: 2 via ORAL
  Filled 2021-01-10: qty 2

## 2021-01-10 MED ORDER — ONDANSETRON HCL 4 MG/2ML IJ SOLN
4.0000 mg | Freq: Once | INTRAMUSCULAR | Status: AC
  Administered 2021-01-10: 4 mg via INTRAVENOUS
  Filled 2021-01-10: qty 2

## 2021-01-10 MED ORDER — FENTANYL CITRATE (PF) 100 MCG/2ML IJ SOLN
50.0000 ug | Freq: Once | INTRAMUSCULAR | Status: AC
  Administered 2021-01-10: 50 ug via INTRAVENOUS
  Filled 2021-01-10: qty 2

## 2021-01-10 NOTE — ED Triage Notes (Signed)
Pt came ems c/o pain starting at midnight in right kidney; pt does dialysis left fistula; temp 97.9; tries to throw up but cant; came from [facility]

## 2021-01-10 NOTE — ED Notes (Signed)
Pt unable to sign discharge pad

## 2021-01-10 NOTE — ED Notes (Signed)
Pt unable to sign MSE waiver due to condition

## 2021-01-10 NOTE — ED Provider Notes (Signed)
[facility] Emergency Department Provider Note  Time seen: 7:22 AM  I have reviewed the triage vital signs and the nursing notes.   HISTORY  Chief Complaint Abdominal Pain and Nausea    HPI Frank [name] is a 80 y.o. 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  According to the patient overnight last night he developed pain in his "right kidney."  Has been feeling nauseated and feels like he needs to vomit but cannot.  Denies any diarrhea.  Denies any fever cough or shortness of breath.  Largely negative review of systems otherwise.  Patient last received dialysis on Saturday and is scheduled for dialysis at 1040 this morning.   Past Medical History:  Diagnosis Date  . Anemia of chronic kidney failure   . Arthritis   . Bradycardia   . Cataracts, bilateral 05/15/2012  . Chronic idiopathic gout of multiple sites 02/10/2015  . Chronic kidney disease    [facility]  . Depression   . Diabetes mellitus without complication (HCC)    diet controlled   . Diabetic nephropathy ([place])   . ED (erectile dysfunction) 08/28/2012  . Elevated liver enzymes 06/10/2019  . Epiretinal membrane (ERM) of right eye 06/10/2019   Right eye saw AE 06/05/2019    . Essential hypertension 05/07/2013   Last Assessment & Plan:  Formatting of this note might be different from the original. HTN PLAN   BP goal is <140/90 BP Readings from Last 3 Encounters:  08/27/16 140/79  08/13/16 141/80  08/06/16 144/86   Current Anti-Hyptensive Medications: None  Today's Recommendations: Continue lifestyle modifications. Defer starting medication to PCP.  Recommend continued regular exercise as tolerated. Recomm  . History of UTI    s/p foley in hospital 01/2019   . Hyperlipidemia   . Hypothyroidism   . Illiterate 11/19/2013   Last Assessment & Plan:  Assisted patient with  Handicapped Placard application today.    . Insomnia 05/08/2019  . Neuropathy   . Peripheral neuropathy   . Peripheral vascular disease ([place])   . Second degree heart block   . Secondary hyperparathyroidism ([place])    Renal  . Thyroid disease     Patient Active Problem List   Diagnosis Date Noted  . Diarrhea, unspecified 01/26/2020  . Allergic rhinitis, unspecified 12/02/2019  . Fluid overload, unspecified 12/02/2019  . Atrioventricular block, Mobitz type 1, Wenckebach   . Left-sided weakness   . 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 Stenosis of right carotid artery   . Arterial hypotension   . Cerebral embolism with cerebral infarction 10/08/2019  . Slurred speech 10/07/2019  . Epiretinal membrane (ERM) of right eye 06/10/2019  . Elevated liver enzymes 06/10/2019  . Benign prostatic hyperplasia 05/08/2019  . Insomnia 05/08/2019  . Coronary artery disease involving native coronary artery of native heart without angina pectoris 05/08/2019  . Atherosclerosis 05/08/2019  . Hyperphosphatemia 05/08/2019  . Secondary hyperparathyroidism ([place])   . Hyperlipidemia   . Gout   . Anemia of chronic kidney failure   . Near syncope 01/21/2019  . Elevated troponin 01/21/2019  . Osteoarthritis of midfoot, right 08/13/2016  . Foot swelling 07/04/2016  . DM type 2 with diabetic peripheral neuropathy ([place]) 11/28/2015  . Pre-transplant evaluation for kidney transplant 11/28/2015  . HLD (hyperlipidemia) 08/01/2015  . Combined arterial insufficiency and corporo-venous occlusive erectile  dysfunction 02/10/2015  . Chronic idiopathic gout of multiple sites 02/10/2015  . Illiterate 11/19/2013  . ESRD (end stage renal disease) ([place]) 05/07/2013  . Essential hypertension 05/07/2013  . Osteoarthritis 05/07/2013  . Diastasis recti 10/29/2012  . 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 ED (erectile dysfunction)  08/28/2012  . Cataracts, bilateral 05/15/2012  . Chronic pain 05/15/2012  . DM (diabetes mellitus) ([place]) 05/15/2012  . Gout of knee 05/15/2012  . Hyperkalemia 05/15/2012  . Hypothyroidism 05/15/2012    Past Surgical History:  Procedure Laterality Date  . AV FISTULA PLACEMENT Left 11/19/2017   Procedure: ARTERIOVENOUS (AV) FISTULA CREATION;  Surgeon: [name], MD;  Location: [place];  Service: Vascular;  Laterality: Left;  . BASCILIC VEIN TRANSPOSITION Left 06/04/2018   Procedure: BASILIC VEIN TRANSPOSITION ARM;  Surgeon: [name], MD;  Location: [place];  Service: Vascular;  Laterality: Left;  . COLONOSCOPY    . DIALYSIS/PERMA CATHETER REMOVAL N/A 03/23/2019   Procedure: DIALYSIS/PERMA CATHETER REMOVAL;  Surgeon: [name], MD;  Location: [place] [facility];  Service: Cardiovascular;  Laterality: N/A;  . EYE SURGERY     cataract surgery b/l; photophobia since   . INSERTION OF DIALYSIS CATHETER N/A 01/23/2019   Procedure: INSERTION OF DIALYSIS CATHETER;  Surgeon: [name], MD;  Location: [facility];  Service: Vascular;  Laterality: N/A;  INSERTION OF DIALYSIS CATHETER  . IR ANGIO INTRA EXTRACRAN SEL COM CAROTID INNOMINATE UNI L MOD SED  10/15/2019  . IR ANGIO VERTEBRAL SEL SUBCLAVIAN INNOMINATE UNI R MOD SED  10/15/2019  . IR CT HEAD LTD  10/15/2019  . IR FLUORO GUIDE CV LINE RIGHT  10/08/2019  . IR INTRAVSC STENT CERV CAROTID W/EMB-PROT MOD SED INCL ANGIO  10/15/2019  . IR THROMBECTOMY AV FISTULA W/THROMBOLYSIS/PTA INC/SHUNT/IMG LEFT Left 10/09/2019  . IR US GUIDE VASC ACCESS LEFT  10/09/2019  . IR US GUIDE VASC ACCESS RIGHT  10/08/2019  . KNEE ARTHROSCOPY Left    Knee  . PERIPHERAL VASCULAR THROMBECTOMY Left 07/06/2019   Procedure: PERIPHERAL VASCULAR THROMBECTOMY;  Surgeon: [name], MD;  Location: [place] [facility];  Service: Cardiovascular;  Laterality: Left;  . RADIOLOGY WITH ANESTHESIA N/A 10/15/2019   Procedure: stent placement;  Surgeon:  Luanne Bras, MD;  Location: [place];  Service: Radiology;  Laterality: N/A;  . THROMBECTOMY W/ EMBOLECTOMY Left 01/27/2019   Procedure: THROMBECTOMY Left arm ARTERIOVENOUS FISTULA  and Left arm Arteriovenous gortex graft;  Surgeon: [name], MD;  Location: [place];  Service: Vascular;  Laterality: Left;  [place] Kitchen VENOGRAM Left 01/27/2019   Procedure: Left arm FISTULOGRAM/;  Surgeon: [name], MD;  Location[facility];  Service: Vascular;  Laterality: Left;    Prior to Admission medications   Medication Sig Start Date End Date Taking? Authorizing Provider  acetaminophen (TYLENOL) 325 MG tablet Take 325 mg by mouth every 6 (six) hours as needed. 357m po q4hrs    [provider]  allopurinol (ZYLOPRIM) 100 MG tablet Take 1 tablet (100 mg total) by mouth daily. 06/03/19   McLean-Scocuzza, TNino Glow MD  ALPRAZolam (Duanne Moron 0.25 MG tablet BID PRN 01/11/20   [provider]  alum & mag hydroxide-simeth (MAALOX/MYLANTA) 200-200-20 MG/5ML suspension Take 5 mLs by mouth every 6 (six) hours as needed for indigestion or heartburn.    [provider]  Amino Acids-Protein Hydrolys (FEEDING SUPPLEMENT, PRO-STAT SUGAR FREE 64,) LIQD Take 30 mLs by mouth daily.  [provider]  aspirin EC 81 MG tablet Take 81 mg daily by mouth.    [provider]  atorvastatin (LIPITOR) 20 MG tablet Take 20 mg by mouth daily at 6 PM.     [provider]  benzonatate (TESSALON) 200 MG capsule Take 200 mg by mouth 3 (three) times daily as needed for cough.    [provider]  bisacodyl (DULCOLAX) 10 MG suppository Place 10 mg rectally as needed for moderate constipation.    [provider]  bismuth subsalicylate (PEPTO BISMOL) 262 MG/15ML suspension Take 15 mLs by mouth every 6 (six) hours as needed. q2 hrs PRN up to 6 doses in 24hrs    [provider]  blood glucose meter kit and supplies Dispense based on patient and insurance preference. Use  up to two times daily as directed. (FOR ICD-10 E10.9, E11.9). STRIPS and LANCETS 1 year supply 12/21/19   McLean-Scocuzza, Nino Glow, MD  calcitRIOL (ROCALTROL) 0.5 MCG capsule Take 1 capsule (0.5 mcg total) by mouth Every Tuesday,Thursday,and Saturday with dialysis. 10/24/19   Regalado, Belkys A, MD  clotrimazole (LOTRIMIN) 1 % cream Apply 1 application topically daily as needed (For yeast, groin rash).    [provider]  docusate sodium (COLACE) 100 MG capsule Take 200 mg by mouth daily as needed.     [provider]  donepezil (ARICEPT) 5 MG tablet Take 1 tablet (5 mg total) by mouth at bedtime. 12/23/19   Melvenia Beam, MD  guaifenesin (ROBITUSSIN) 100 MG/5ML syrup Take 100 mg by mouth 3 (three) times daily as needed for cough. Get 84m q6hr PRN    [provider]  ketoconazole (NIZORAL) 2 % shampoo Apply 1 application topically once. On Monday and Thursday 10/24/19   [provider]  levothyroxine (SYNTHROID) 175 MCG tablet Take 1 tablet (175 mcg total) by mouth daily before breakfast. 10/24/19   Regalado, Belkys A, MD  lidocaine-prilocaine (EMLA) cream Apply 1 application topically See admin instructions. Apply to access site 1 to 2 hours before dialysis. Cover with occlusive dressing.On Tuesday,Thursday and Saturday 04/08/19   [provider]  loperamide (IMODIUM) 2 MG capsule Take 2 mg by mouth as needed for diarrhea or loose stools.    [provider]  magnesium hydroxide (MILK OF MAGNESIA) 400 MG/5ML suspension Take 5 mLs by mouth daily as needed for mild constipation. 30cc 2 tablespoons full  By mounth as needed for one dose    [provider]  Melatonin 3 MG CAPS Take by mouth. One tab po at bed time- Taking 16m   [provider]  midodrine (PROAMATINE) 10 MG tablet Take 1 tablet (10 mg total) by mouth 3 (three) times daily with meals. Patient taking differently: Take 10 mg by mouth See admin instructions. With meals.  Administer 1/2 hour before dialysis on Tuesday, Thursday, Saturday. 12/14/19   TiShirley FriarPA-C  Multiple Vitamin (MULTIVITAMIN WITH MINERALS) TABS tablet Take 1 tablet by mouth daily.    [provider]  ondansetron (ZOFRAN) 4 MG tablet Take 1 tablet (4 mg total) by mouth every 8 (eight) hours as needed. 11/06/20   GoNance PearMD  pantoprazole (PROTONIX) 40 MG tablet Take 1 tablet (40 mg total) by mouth daily. 10/23/19 05/25/20  Regalado, Belkys A, MD  polyethylene glycol (MIRALAX / GLYCOLAX) 17 g packet Take 17 g by mouth daily. 10/23/19   Regalado, Belkys A, MD  senna (SENOKOT) 8.6 MG TABS tablet Take 1 tablet by  mouth.    [provider]  sevelamer carbonate (RENVELA) 800 MG tablet Take 3 tablets (2,400 mg total) by mouth 3 (three) times daily with meals. 10/23/19   Regalado, Belkys A, MD  sodium phosphate Pediatric (FLEET) 3.5-9.5 GM/59ML enema Place 1 enema rectally once. PRN    [provider]  tamsulosin (FLOMAX) 0.4 MG CAPS capsule Take 1 capsule (0.4 mg total) by mouth daily. Patient taking differently: Take 0.8 mg by mouth daily. 2 capsule 10/23/19   Regalado, Belkys A, MD  ticagrelor (BRILINTA) 90 MG TABS tablet Take 1 tablet (90 mg total) by mouth 2 (two) times daily. 10/23/19   Regalado, Belkys A, MD  traZODone (DESYREL) 50 MG tablet Take 0.5 tablets (25 mg total) by mouth at bedtime as needed for sleep. 12/28/19   McLean-Scocuzza, Nino Glow, MD  Zinc Oxide (BALMEX EX) Apply 1 application topically daily as needed (to irritated areas of skin).     [provider]    Allergies  Allergen Reactions  . Ace Inhibitors Other (See Comments)    Hyperkalemia on ACE INHIBITORS Cr>1.9  . Angiotensin Receptor Blockers Other (See Comments)    Hyperkalemia Elevates Cr > 1.9   . Ivp Dye [Iodinated Diagnostic Agents] Other (See Comments)    Cannot have due to renal failure  . Adhesive [Tape] Other (See Comments)    UNDESCRIBED REACTION Can tolerate  ONLY Coban wrap or mild paper tape!!    Family History  Problem Relation Age of Onset  . Diabetes Mother   . Kidney disease Mother   . Liver disease Mother   . Heart Problems Mother   . Stroke Neg Hx     Social History Social History   Tobacco Use  . Smoking status: Former Smoker    Years: 30.00    Quit date: 1994    Years since quitting: 28.3  . Smokeless tobacco: Never Used  Vaping Use  . Vaping Use: Never used  Substance Use Topics  . Alcohol use: No  . Drug use: No    Review of Systems Constitutional: Negative for fever. Cardiovascular: Negative for chest pain. Respiratory: Negative for shortness of breath. Gastrointestinal: Right flank pain.  Positive for nausea. Genitourinary: Negative for urinary compaints.  Creates a small amount of urine each day. Musculoskeletal: Negative for musculoskeletal complaints Neurological: Negative for headache All other ROS negative  ____________________________________________   PHYSICAL EXAM:  Constitutional: Alert and oriented. Well appearing and in no distress. Eyes: Normal exam ENT      Head: Normocephalic and atraumatic.      Mouth/Throat: Mucous membranes are moist. Cardiovascular: Normal rate, regular rhythm. Respiratory: Normal respiratory effort without tachypnea nor retractions. Breath sounds are clear  Gastrointestinal: Soft and nontender. No distention.   Musculoskeletal: Nontender with normal range of motion in all extremities.  Neurologic:  Normal speech and language. No gross focal neurologic deficits  Skin:  Skin is warm, dry and intact.  Psychiatric: Mood and affect are normal.  ____________________________________________    EKG  EKG viewed and interpreted by myself shows a normal sinus rhythm 83 bpm with a narrow QRS, left axis deviation, largely normal intervals with no concerning ST changes.  ____________________________________________    RADIOLOGY  IMPRESSION:  1. 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  2. Enlarged prostate up lifting and deforming the bladder base,  limiting assessment of the right ureteral orifice.  3. Cholelithiasis without evidence of cholecystitis.   ____________________________________________   INITIAL IMPRESSION / ASSESSMENT AND  PLAN / ED COURSE  Pertinent labs & imaging results that were available during my care of the patient were reviewed by me and considered in my medical decision making (see chart for details).   Patient presents to the emergency department for right flank pain and nausea beginning overnight.  Currently the patient appears well, no distress.  Does have mild vague tenderness palpation on abdominal exam but no focal area of tenderness identified.  Exam is somewhat difficult as patient is quite sensitive, patient jumps every time an EKG lead is placed on them because they are "too cold."  Patient is on dialysis, was scheduled for dialysis this morning.  We will check labs, obtain a CT renal scan, dose nausea medication and continue to closely monitor while awaiting results.  Patient agreeable to plan of care.  Differential would include electrolyte or metabolic abnormality, UTI or pyelonephritis, ureterolithiasis, constipation, colitis or diverticulitis, or other intra-abdominal abnormality.  Patient's work-up is reassuring.  Lab work does show need for dialysis but does not appear to be emergent.  We spoke to the patient dialysis center and they will see him at 11 AM for his scheduled dialysis.  Patient CT scan is reassuring.  Patient appears well.  We will discharge to his dialysis center for dialysis and have the patient follow-up with his doctor.  Frank [name] was evaluated in Emergency Department on 01/10/2021 for the symptoms described in the history of present illness. He was evaluated in the context of the global COVID-19 pandemic, which necessitated consideration that the patient might  be at risk for infection with the SARS-CoV-2 virus that causes COVID-19. Institutional protocols and algorithms that pertain to the evaluation of patients at risk for COVID-19 are in a state of rapid change based on information released by regulatory bodies including the CDC and federal and state organizations. These policies and algorithms were followed during the patient's care in the ED.  ____________________________________________   FINAL CLINICAL IMPRESSION(S) / ED DIAGNOSES  Nausea Right flank pain   Harvest Dark, MD 01/10/21 1005

## 2021-01-11 LAB — URINE CULTURE

## 2021-02-10 ENCOUNTER — Ambulatory Visit (HOSPITAL_COMMUNITY)
Admission: RE | Admit: 2021-02-10 | Discharge: 2021-02-10 | Disposition: A | Discharge status: Home / Self Care | Payer: Medicare Other | Source: Ambulatory Visit | Attending: Interventional Radiology | Admitting: Interventional Radiology

## 2021-02-10 ENCOUNTER — Other Ambulatory Visit: Payer: Self-pay

## 2021-02-10 DIAGNOSIS — I6521 Occlusion and stenosis of right carotid artery: Secondary | ICD-10-CM

## 2021-02-10 NOTE — Progress Notes (Signed)
Carotid duplex has been completed.   Preliminary results in CV Proc.   Abram Sander 02/10/2021 10:23 AM

## 2021-02-22 ENCOUNTER — Telehealth (HOSPITAL_COMMUNITY): Payer: Self-pay

## 2021-02-22 NOTE — Telephone Encounter (Signed)
Pt's daughter agreed to f/u in 6 months with US carotid. AW

## 2021-04-03 ENCOUNTER — Ambulatory Visit (INDEPENDENT_AMBULATORY_CARE_PROVIDER_SITE_OTHER): Payer: Medicare Other | Admitting: Adult Health

## 2021-04-03 ENCOUNTER — Encounter: Payer: Self-pay | Admitting: Adult Health

## 2021-04-03 VITALS — BP 137/61 | HR 66 | Ht 68.0 in | Wt 208.0 lb

## 2021-04-03 DIAGNOSIS — G319 Degenerative disease of nervous system, unspecified: Secondary | ICD-10-CM

## 2021-04-03 DIAGNOSIS — E785 Hyperlipidemia, unspecified: Secondary | ICD-10-CM | POA: Diagnosis not present

## 2021-04-03 DIAGNOSIS — I10 Essential (primary) hypertension: Secondary | ICD-10-CM | POA: Diagnosis not present

## 2021-04-03 DIAGNOSIS — I63131 Cerebral infarction due to embolism of right carotid artery: Secondary | ICD-10-CM | POA: Diagnosis not present

## 2021-04-03 DIAGNOSIS — I63239 Cerebral infarction due to unspecified occlusion or stenosis of unspecified carotid arteries: Secondary | ICD-10-CM | POA: Diagnosis not present

## 2021-04-03 NOTE — Patient Instructions (Addendum)
Per review of progress notes, recommenced doing SLP - per patient, not yet started. I do agree with doing speech therapy to see if this helps improve.   Continue current medications for stroke prevention - no changes today  Continue to follow up with PCP regarding cholesterol, blood pressure and diabetes management and routine monitoring of cholesterol and A1c levels Maintain strict control of hypertension with blood pressure goal below 130/90, diabetes with hemoglobin A1c goal below 7% and cholesterol with LDL cholesterol (bad cholesterol) goal below 70 mg/dL.   Continue to follow with Dr .Estanislado Pandy for routine monitoring of carotid stent as well as ongoing use of Brilinta       Followup in the future with me in 6 months or call earlier if needed       Thank you for coming to see [place] at [facility]. I hope we have been able to provide you high quality care today.  You may receive a patient satisfaction survey over the next few weeks. We would appreciate your feedback and comments so that we may continue to improve ourselves and the health of our patients.

## 2021-04-03 NOTE — Progress Notes (Addendum)
[facility]    Provider:  Dr Jaynee Eagles Requesting Provider: Bonnita Nasuti, MD Primary Care Provider:  Verl Blalock, NP  CC:  Stroke, follow up from hospital  HPI:    Today, 04/03/2021, [name] returns for overdue 61-monthfollow-up accompanied by his daughter who provides majority of history (prior visit in 12/2020 rescheduled due to daughter having COVID).  [facility]  He has been stable from stroke standpoint without new stroke/TIA symptoms. 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  He is currently waiting to start working with SLP.  Compliant on aspirin, Brilinta and atorvastatin without associated side effects per review of MAR.  Cognition stable currently on Aricept 5 mg nightly per ALF.  Blood pressure today 137/61.  Remains on midodrine PRN during HD sessions that he continues to receive twice weekly.  Recent repeat carotid ultrasound showed stable appearance of right ICA stent <50% stenosis. Per daughter, they were advised to continue Brilinta with plans on repeat carotid duplex around December.  No new concerns at this time.     History provided for reference purposes only Update 06/29/2020 JM: Mr. EMimmsreturns for stroke follow-up accompanied by his daughter who provides majority of history.  Stable from stroke standpoint without new or reoccurring stroke/TIA symptoms.  Cognition has been stable without worsening and he continues to reside at [facility].  Remains on aspirin and Brilinta with mild bruising but no bleeding.  Continues on atorvastatin 20 mg daily without myalgias.  Blood pressure today 97/62 currently on midodrine 3 times daily and as needed during HD sessions.  He did have carotid ultrasound completed today ordered by Dr. DEstanislado Pandywhich showed right ICA stent <50% stenosis and left ICA 1 to 39% stenosis.  No concerns at this  time.  Initial visit 12/23/2019 Dr. AHart Rochesteris a 80y.o. male here as requested by [name] MD for hospital follow up. PMHx thyroid disease, secondary hyperparathyroidism, end-stage renal disease on dialysis, peripheral vascular disease, peripheral neuropathy, insomnia, illiterate, hyperlipidemia, hypertension, diabetic nephropathy, diabetes mellitus, depression, chronic kidney disease, gout, bradycardia, anemia of chronic kidney disease, coronary artery disease, cerebral embolism with cerebral infarction and stenosis of the right carotid artery.  I reviewed notes from the hospital, patient presented on January 27 of this year for facial droop and altered mental status, family found him slumped over and having difficulty walking, legs weak for about a week, code stroke was initiated however he was outside the window for IV TPA.  His CTA head and neck showed a 75% stenosis of the right ICA.  MRI of the brain (personally reviewed images) showed multiple bilateral scattered infarcts in the watershed territories secondary to hypoperfusion in the setting of chronic hypotension and right ICA high-grade stenosis, status post stenting of the right ICA, he was started on aspirin and Plavix.  Chronic hypotension, on midodrine, he was already on Lipitor and his LDL was 35, his hemoglobin A1c was 6.7 which was within goal.  Here with his daughter who also provides much information. He feels he is improved. He has always had some stuttering that worsened after his stroke but now improved back to baseline. He is using a walker. He has dialysis 3x weekly. He has a walker and walks well. He feels weaker after dialysis. He is living in assisted living. [place]  [facility], He wants to drive and we discussed I do not recommend that, he has memory changes likely dementia, daughter says his memory is worsening, he perseverates on subjects, now getting PT at assisted living and OT  and speech too. Asiisted living managing medications. He had at least one fall at [facility] but now PT is working with him. [facility] is [facility] and we discussed given his cognitive status, his physical status, multiple complicated comorbidities and stroke, I do recommend that he stays in assisted living and I discussed there is no driving.  Patient was disappointed he still wants to drive, I did explain that it was not safe for him or for other people on the road to have him driving.  But I did try to encourage him that transportation is available to him.  He is adjusting to his new living environment.  He is starting to see a new primary care doctor at [facility], she saw him today and daughter said they liked her very much and they discussed changing medications for anxiety.  We also discussed today his blood pressure is low systolic around [id number], given his watershed infarcts and his atherosclerosis I do recommend that his blood pressure stay above 120 and he continues the midodrine 3 times a day.  For his anxiety they can modify his trazodone, we could try a very very low-dose as needed benzodiazepine but I will defer to his new doctor at [facility] assisted living for this.  We discussed fall risks.  We discussed managing his vascular risk factors, and I am very happy that he has so much support between his daughter and his assisted living facility.  His daughter appears to be quite caring and knowledgeable.  He is eating well without swallowing difficulties.  No pain.  Reviewed notes, labs and imaging from outside physicians, which showed:  MRI of the brain (personally reviewed imaging) and agree with the following: 1. 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 No associated hemorrhage or mass effect. 2. Underlying moderately advanced age-related  cerebral atrophy with mild chronic small vessel ischemic disease  Review of Systems: Patient complains of symptoms per HPI.  Pertinent negatives and positives per HPI. All others negative.   Social History   Socioeconomic History   Marital status: Divorced    Spouse name: Not on file   Number of children: 4   Years of education: Not on file   Highest education level: 8th grade  Occupational History   Not on file  Tobacco Use   Smoking status: Former    Years: 30.00    Types: Cigarettes    Quit date: 1994    Years since quitting: 28.5   Smokeless tobacco: Never  Vaping Use   Vaping Use: Never used  Substance and Sexual Activity   Alcohol use: No   Drug use: No   Sexual activity: Not Currently  Other Topics Concern   Not on file  Social History Narrative   4 kids (3 daughters and 1 son)   Daughter [name] DPR      Lives at [facility]    Social Determinants of Health   Financial Resource Strain: Not on file  Food Insecurity: Not on file  Transportation Needs: Not on file  Physical Activity: Not on file  Stress: Not on file  Social Connections: Not on file  Intimate Partner Violence: Not on  file    Family History  Problem Relation Age of Onset   Diabetes Mother    Kidney disease Mother    Liver disease Mother    Heart Problems Mother    Stroke Neg Hx     Past Medical History:  Diagnosis Date   Anemia of chronic kidney failure    Arthritis    Bradycardia    Cataracts, bilateral 05/15/2012   Chronic idiopathic gout of multiple sites 02/10/2015   Chronic kidney disease    [facility]   Depression    Diabetes mellitus without complication ([place])    diet controlled    Diabetic nephropathy ([place])    ED (erectile dysfunction) 08/28/2012   Elevated liver enzymes 06/10/2019   Epiretinal membrane (ERM) of right eye 06/10/2019   Right eye saw AE 06/05/2019     Essential hypertension 05/07/2013   Last Assessment & Plan:  Formatting of this note  might be different from the original. HTN PLAN   BP goal is <140/90 BP Readings from Last 3 Encounters:  08/27/16 140/79  08/13/16 141/80  08/06/16 144/86   Current Anti-Hyptensive Medications: None  Today's Recommendations: Continue lifestyle modifications. Defer starting medication to PCP.  Recommend continued regular exercise as tolerated. Recomm   History of UTI    s/p foley in hospital 01/2019    Hyperlipidemia    Hypothyroidism    Illiterate 11/19/2013   Last Assessment & Plan:  Assisted patient with Handicapped Placard application today.     Insomnia 05/08/2019   Neuropathy    Peripheral neuropathy    Peripheral vascular disease (HCC)    Second degree heart block    Secondary hyperparathyroidism ([place])    Renal   Thyroid disease     Patient Active Problem List   Diagnosis Date Noted   Diarrhea, unspecified 01/26/2020   Allergic rhinitis, unspecified 12/02/2019   Fluid overload, unspecified 12/02/2019   Atrioventricular block, Mobitz type 1, Wenckebach    Left-sided weakness    Cerebrovascular accident (CVA) due to embolism of right carotid artery ([place])    Stenosis of right carotid artery    Arterial hypotension    Cerebral embolism with cerebral infarction 10/08/2019   Slurred speech 10/07/2019   Epiretinal membrane (ERM) of right eye 06/10/2019   Elevated liver enzymes 06/10/2019   Benign prostatic hyperplasia 05/08/2019   Insomnia 05/08/2019   Coronary artery disease involving native coronary artery of native heart without angina pectoris 05/08/2019   Atherosclerosis 05/08/2019   Hyperphosphatemia 05/08/2019   Secondary hyperparathyroidism ([place])    Hyperlipidemia    Gout    Anemia of chronic kidney failure    Near syncope 01/21/2019   Elevated troponin 01/21/2019   Osteoarthritis of midfoot, right 08/13/2016   Foot swelling 07/04/2016   DM type 2 with diabetic peripheral neuropathy ([place]) 11/28/2015   Pre-transplant evaluation for kidney transplant 11/28/2015   HLD  (hyperlipidemia) 08/01/2015   Combined arterial insufficiency and corporo-venous occlusive erectile dysfunction 02/10/2015   Chronic idiopathic gout of multiple sites 02/10/2015   Illiterate 11/19/2013   ESRD (end stage renal disease) ([place]) 05/07/2013   Essential hypertension 05/07/2013   Osteoarthritis 05/07/2013   Diastasis recti 10/29/2012   Type 2 diabetes mellitus with retinopathy without macular edema ([place]) 10/17/2012   ED (erectile dysfunction) 08/28/2012   Cataracts, bilateral 05/15/2012   Chronic pain 05/15/2012   DM (diabetes mellitus) ([place]) 05/15/2012   Gout of knee 05/15/2012   Hyperkalemia 05/15/2012   Hypothyroidism 05/15/2012    Past Surgical  History:  Procedure Laterality Date   AV FISTULA PLACEMENT Left 11/19/2017   Procedure: ARTERIOVENOUS (AV) FISTULA CREATION;  Surgeon: [name], MD;  Location: [facility];  Service: Vascular;  Laterality: Left;   [place] Left 06/04/2018   Procedure: BASILIC VEIN TRANSPOSITION ARM;  Surgeon: [name], MD;  Location: [facility];  Service: Vascular;  Laterality: Left;   COLONOSCOPY     DIALYSIS/PERMA CATHETER REMOVAL N/A 03/23/2019   Procedure: DIALYSIS/PERMA CATHETER REMOVAL;  Surgeon: [name], MD;  Location: [facility];  Service: Cardiovascular;  Laterality: N/A;   EYE SURGERY     cataract surgery b/l; photophobia since    INSERTION OF DIALYSIS CATHETER N/A 01/23/2019   Procedure: INSERTION OF DIALYSIS CATHETER;  Surgeon: [name], MD;  Location: [facility];  Service: Vascular;  Laterality: N/A;  INSERTION OF DIALYSIS CATHETER   IR ANGIO INTRA EXTRACRAN SEL COM CAROTID INNOMINATE UNI L MOD SED  10/15/2019   IR ANGIO VERTEBRAL SEL SUBCLAVIAN INNOMINATE UNI R MOD SED  10/15/2019   IR CT HEAD LTD  10/15/2019   IR FLUORO GUIDE CV LINE RIGHT  10/08/2019   IR INTRAVSC STENT CERV CAROTID W/EMB-PROT MOD SED INCL ANGIO  10/15/2019   IR THROMBECTOMY AV FISTULA W/THROMBOLYSIS/PTA  INC/SHUNT/IMG LEFT Left 10/09/2019   IR US GUIDE VASC ACCESS LEFT  10/09/2019   IR US GUIDE VASC ACCESS RIGHT  10/08/2019   KNEE ARTHROSCOPY Left    Knee   PERIPHERAL VASCULAR THROMBECTOMY Left 07/06/2019   Procedure: PERIPHERAL VASCULAR THROMBECTOMY;  Surgeon: [name], MD;  Location: [facility];  Service: Cardiovascular;  Laterality: Left;   RADIOLOGY WITH ANESTHESIA N/A 10/15/2019   Procedure: stent placement;  Surgeon: Luanne Bras, MD;  Location: [facility];  Service: Radiology;  Laterality: N/A;   THROMBECTOMY W/ EMBOLECTOMY Left 01/27/2019   Procedure: THROMBECTOMY Left arm ARTERIOVENOUS FISTULA  and Left arm Arteriovenous gortex graft;  Surgeon: [name], MD;  [facility];  Service: Vascular;  Laterality: Left;   VENOGRAM Left 01/27/2019   Procedure: Left arm FISTULOGRAM/;  Surgeon: [name], MD;  Location: [facility] OR;  Service: Vascular;  Laterality: Left;    Current Outpatient Medications  Medication Sig Dispense Refill   acetaminophen (TYLENOL) 325 MG tablet Take 325 mg by mouth every 6 (six) hours as needed. '325mg'$  po q4hrs     albuterol (VENTOLIN HFA) 108 (90 Base) MCG/ACT inhaler Inhale into the lungs.     allopurinol (ZYLOPRIM) 100 MG tablet Take 1 tablet (100 mg total) by mouth daily. 90 tablet 3   ALPRAZolam (XANAX) 0.25 MG tablet BID PRN     aspirin EC 81 MG tablet Take 81 mg daily by mouth.     atorvastatin (LIPITOR) 20 MG tablet Take 20 mg by mouth daily at 6 PM.      benzonatate (TESSALON) 200 MG capsule Take 200 mg by mouth 3 (three) times daily as needed for cough.     bisacodyl (DULCOLAX) 10 MG suppository Place 10 mg rectally as needed for moderate constipation.     bismuth subsalicylate (PEPTO BISMOL) 262 MG/15ML suspension Take 15 mLs by mouth every 6 (six) hours as needed. q2 hrs PRN up to 6 doses in 24hrs     Cholecalciferol (VITAMIN D) 50 MCG (2000 UT) tablet Take 2,000 Units by mouth daily.     docusate sodium (COLACE) 100 MG capsule  Take 200 mg by mouth daily as needed.  donepezil (ARICEPT) 5 MG tablet Take 1 tablet (5 mg total) by mouth at bedtime. 30 tablet 6   ketoconazole (NIZORAL) 2 % shampoo Apply 1 application topically once. On Monday and Thursday     levothyroxine (SYNTHROID) 175 MCG tablet Take 1 tablet (175 mcg total) by mouth daily before breakfast. 30 tablet 0   lidocaine-prilocaine (EMLA) cream Apply 1 application topically See admin instructions. Apply to access site 1 to 2 hours before dialysis. Cover with occlusive dressing.On Tuesday,Thursday and Saturday     loperamide (IMODIUM) 2 MG capsule Take 2 mg by mouth as needed for diarrhea or loose stools.     magnesium hydroxide (MILK OF MAGNESIA) 400 MG/5ML suspension Take 5 mLs by mouth daily as needed for mild constipation. 30cc 2 tablespoons full  By mounth as needed for one dose     Melatonin 3 MG CAPS Take by mouth. One tab po at bed time- Taking '10mg'$      midodrine (PROAMATINE) 10 MG tablet Take 1 tablet (10 mg total) by mouth 3 (three) times daily with meals. (Patient taking differently: Take 10 mg by mouth See admin instructions. With meals. Administer 1/2 hour before dialysis on Tuesday, Thursday, Saturday.) 270 tablet 3   Multiple Vitamin (MULTIVITAMIN WITH MINERALS) TABS tablet Take 1 tablet by mouth daily.     ondansetron (ZOFRAN) 4 MG tablet Take 1 tablet (4 mg total) by mouth every 8 (eight) hours as needed. 20 tablet 0   pantoprazole (PROTONIX) 40 MG tablet Take 1 tablet (40 mg total) by mouth daily. 30 tablet 1   polyethylene glycol (MIRALAX / GLYCOLAX) 17 g packet Take 17 g by mouth daily. 14 each 0   senna (SENOKOT) 8.6 MG TABS tablet Take 1 tablet by mouth.     sevelamer carbonate (RENVELA) 800 MG tablet Take 3 tablets (2,400 mg total) by mouth 3 (three) times daily with meals. 120 tablet 0   sodium phosphate Pediatric (FLEET) 3.5-9.5 GM/59ML enema Place 1 enema rectally once. PRN     tamsulosin (FLOMAX) 0.4 MG CAPS capsule Take 1 capsule (0.4  mg total) by mouth daily. (Patient taking differently: Take 0.8 mg by mouth daily. 2 capsule) 30 capsule 1   ticagrelor (BRILINTA) 90 MG TABS tablet Take 1 tablet by mouth 2 (two) times daily.     tiZANidine (ZANAFLEX) 2 MG tablet Take 2 mg by mouth at bedtime.     traZODone (DESYREL) 50 MG tablet Take 0.5 tablets (25 mg total) by mouth at bedtime as needed for sleep. 90 tablet 3   Zinc Oxide (BALMEX EX) Apply 1 application topically daily as needed (to irritated areas of skin).      No current facility-administered medications for this visit.    Allergies as of 04/03/2021 - Review Complete 04/03/2021  Allergen Reaction Noted   Ace inhibitors Other (See Comments) 05/15/2012   Angiotensin receptor blockers Other (See Comments) 05/15/2012   Ivp dye [iodinated diagnostic agents] Other (See Comments) 01/21/2019   Adhesive [tape] Other (See Comments) 01/21/2019    Vitals: Today's Vitals   04/03/21 0808  BP: 137/61  Pulse: 66  Weight: 208 lb (94.3 kg)  Height: '5\' 8"'$  (1.727 m)    Body mass index is 31.63 kg/m.  Physical exam: General: well developed, well nourished,  very pleasant elderly Caucasian male, seated, in no evident distress Head: head normocephalic and atraumatic.   Neck: supple with no carotid or supraclavicular bruits Cardiovascular: regular rate and rhythm, no murmurs Musculoskeletal: no deformity Skin:  no rash/petichiae Vascular:  Normal pulses all extremities   Neurologic Exam Mental Status: Awake and fully alert.  Mild to moderate stuttering but no evidence of aphasia or dysarthria.  Oriented to place and time. Recent memory impaired and remote memory intact. Attention span, concentration and fund of knowledge appropriate during visit. Mood and affect appropriate.  Cranial Nerves: Pupils equal, briskly reactive to light. Extraocular movements full without nystagmus. Visual fields full to confrontation. Hearing intact. Facial sensation intact. Face, tongue, palate  moves normally and symmetrically.  Motor: Normal bulk and tone. Normal strength in all tested extremity muscles. Sensory.: intact to touch , pinprick , position and vibratory sensation.  Coordination: Rapid alternating movements normal in all extremities. Finger-to-nose and heel-to-shin performed accurately bilaterally. Gait and Station: Arises from chair without difficulty. Stance is normal. Gait demonstrates normal stride length and balance with use of Rollator walker Reflexes: 1+ and symmetric. Toes downgoing.      Assessment/Plan:  80 y.o. male here as requested by McLean-Scocuzza, Olivia Mackie for hospital follow up regarding multiple white matter infarcts s/p stent angioplasty of right ICA 10/15/2019.  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    -Multiple bilateral scattered infarcts secondary to hypoperfusion in the setting of right ICA high-grade stenosis, status post stenting.  Carotid ultrasound 06/29/2020 and 02/10/2021 showed right ICA stent<50% stenosis in left ICA 1 to 39% stenosis currently monitored by Dr. Estanislado Pandy.  Plans on repeat duplex around 08/2021.  Remains on Brilinta per Dr. Arlean Hopping recommendations (reported per daughter) -Continue aspirin and atorvastatin 20 mg daily for HLD management and secondary stroke prevention.   -Discussed importance of avoiding hypotension and to continue midodrine 3 times daily and as needed during HD visits which can be managed and prescribed by cardiology/nephrology -Remains on Aricept 5 mg nightly with cognition stable  since prior visit -continue Aricept 5 mg daily managed by facility    Follow-up in 6 months or call earlier if needed   CC:  [facility] provider: Dr. Kandace Blitz, Edmonia Lynch, NP    I spent 31 minutes of face-to-face and non-face-to-face time with patient and daughter.  This included previsit chart review, lab review, study review, order entry, electronic health record documentation, patient education and discussion regarding history of stroke as well as secondary stroke prevention measures and aggressive stroke risk factor management, right ICA stenosis s/p stent and review of recent ultrasound, ongoing use of above medications and answered all questions to patient and daughter satisfaction  Frann Rider, AGNP-BC  [facility] [address] [place], [place] [address]  Phone [phone] Fax [phone] Note: This document was prepared with digital dictation and possible smart phrase technology. Any transcriptional errors that result from this process are unintentional.   agree with assessment and plan as stated.     Sarina Ill, MD [facility]

## 2021-08-30 ENCOUNTER — Telehealth (HOSPITAL_COMMUNITY): Payer: Self-pay

## 2021-08-30 NOTE — Telephone Encounter (Signed)
Called to schedule us carotid, no answer, left vm. AW  

## 2021-09-06 ENCOUNTER — Other Ambulatory Visit (HOSPITAL_COMMUNITY): Payer: Self-pay | Admitting: Interventional Radiology

## 2021-09-06 DIAGNOSIS — I6521 Occlusion and stenosis of right carotid artery: Secondary | ICD-10-CM

## 2021-09-18 ENCOUNTER — Other Ambulatory Visit: Payer: Self-pay

## 2021-09-18 DIAGNOSIS — N186 End stage renal disease: Secondary | ICD-10-CM

## 2021-10-02 ENCOUNTER — Encounter: Payer: Self-pay | Admitting: Adult Health

## 2021-10-02 ENCOUNTER — Ambulatory Visit (INDEPENDENT_AMBULATORY_CARE_PROVIDER_SITE_OTHER): Payer: Medicare Other | Admitting: Adult Health

## 2021-10-02 ENCOUNTER — Other Ambulatory Visit: Payer: Self-pay

## 2021-10-02 ENCOUNTER — Ambulatory Visit (HOSPITAL_COMMUNITY)
Admission: RE | Admit: 2021-10-02 | Discharge: 2021-10-02 | Disposition: A | Discharge status: Home / Self Care | Payer: Medicare Other | Source: Ambulatory Visit | Attending: Interventional Radiology | Admitting: Interventional Radiology

## 2021-10-02 VITALS — BP 151/73 | HR 71

## 2021-10-02 DIAGNOSIS — I63131 Cerebral infarction due to embolism of right carotid artery: Secondary | ICD-10-CM

## 2021-10-02 DIAGNOSIS — I6521 Occlusion and stenosis of right carotid artery: Secondary | ICD-10-CM | POA: Diagnosis present

## 2021-10-02 NOTE — Progress Notes (Signed)
[facility]    Provider:  Dr Jaynee Eagles Requesting Provider: Verl Blalock, NP Primary Care Provider:  Verl Blalock, NP    Chief Complaint  Patient presents with   Follow-up    Rm 3  with daughter [name]  Pt is well and stable, no new concerns       HPI:   Update 10/02/2021 JM: Returns for 77-month stroke follow-up accompanied by his daughter.  Continues to reside at [facility].  Overall stable without new stroke/TIA symptoms. Worsening baseline stuttering post stroke stable - reports at times worsening with fatigue.  Cognition stable per daughter.  Remains on Aricept managed by facility.  Compliant on aspirin and atorvastatin without side effects.  Blood pressure today 151/73. Recent blood pressure at ALF 132/80. Use of midodrine PRN during HD sessions. Routinely followed by [facility]. Completed repeat carotid ultrasound today - remains on Brilinta per IR recommendations. Did have COVID end of last month - has since recovered well. Reports visit with Dr. Donzetta Matters this week for occluded  HD graft site - currently using temporary catheter.  No further concerns at this time.     History provided for reference purposes only Update 04/03/2021 JM: Mr. Gartman returns for overdue 28-month follow-up accompanied by his daughter who provides majority of history (prior visit in 12/2020 rescheduled due to daughter having COVID).  Current residence of [facility].  He has been stable from stroke standpoint without new stroke/TIA symptoms. 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  He is currently waiting to start working with SLP.  Compliant on aspirin, Brilinta and atorvastatin without associated side effects per review of MAR.  Cognition stable currently on Aricept 5 mg nightly per ALF.  Blood pressure today 137/61.  Remains on midodrine PRN during HD sessions that he continues to  receive twice weekly.  Recent repeat carotid ultrasound showed stable appearance of right ICA stent <50% stenosis. Per daughter, they were advised to continue Brilinta with plans on repeat carotid duplex around December.  No new concerns at this time.   Update 06/29/2020 JM: Mr. Rosenbloom returns for stroke follow-up accompanied by his daughter who provides majority of history.  Stable from stroke standpoint without new or reoccurring stroke/TIA symptoms.  Cognition has been stable without worsening and he continues to reside at [facility].  Remains on aspirin and Brilinta with mild bruising but no bleeding.  Continues on atorvastatin 20 mg daily without myalgias.  Blood pressure today 97/62 currently on midodrine 3 times daily and as needed during HD sessions.  He did have carotid ultrasound completed today ordered by Dr. Estanislado Pandy which showed right ICA stent <50% stenosis and left ICA 1 to 39% stenosis.  No concerns at this time.  Initial visit 12/23/2019 Dr. Hart Rochester is a 81 y.o. male here as requested by Verl Blalock, NP for hospital follow up. PMHx thyroid disease, secondary hyperparathyroidism, end-stage renal disease on dialysis, peripheral vascular disease, peripheral neuropathy, insomnia, illiterate, hyperlipidemia, hypertension, diabetic nephropathy, diabetes mellitus, depression, chronic kidney disease, gout, bradycardia, anemia of chronic kidney disease, coronary artery disease, cerebral embolism with cerebral infarction and stenosis of the right carotid artery.  I reviewed notes from the hospital, patient presented on January 27 of this year for facial droop and altered mental status, family found him slumped over and having difficulty walking, legs weak for about a week, code stroke  was initiated however he was outside the window for IV TPA.  His CTA head and neck showed a 75% stenosis of the right ICA.  MRI of the brain (personally reviewed images) showed multiple bilateral  scattered infarcts in the watershed territories secondary to hypoperfusion in the setting of chronic hypotension and right ICA high-grade stenosis, status post stenting of the right ICA, he was started on aspirin and Plavix.  Chronic hypotension, on midodrine, he was already on Lipitor and his LDL was 35, his hemoglobin A1c was 6.7 which was within goal.  Here with his daughter who also provides much information. He feels he is improved. He has always had some stuttering that worsened after his stroke but now improved back to baseline. He is using a walker. He has dialysis 3x weekly. He has a walker and walks well. He feels weaker after dialysis. He is living in assisted living. Daughter states he did very well in [facility], He wants to drive and we discussed I do not recommend that, he has memory changes likely dementia, daughter says his memory is worsening, he perseverates on subjects, now getting PT at assisted living and OT and speech too. Asiisted living managing medications. He had at least one fall at [facility] but now PT is working with him. He is at [facility] and we discussed given his cognitive status, his physical status, multiple complicated comorbidities and stroke, I do recommend that he stays in assisted living and I discussed there is no driving.  Patient was disappointed he still wants to drive, I did explain that it was not safe for him or for other people on the road to have him driving.  But I did try to encourage him that transportation is available to him.  He is adjusting to his new living environment.  He is starting to see a new primary care doctor at [facility], she saw him today and daughter said they liked her very much and they discussed changing medications for anxiety.  We also discussed today his blood pressure is low systolic around 324, given his watershed infarcts and his atherosclerosis I do recommend that his blood pressure stay above  120 and he continues the midodrine 3 times a day.  For his anxiety they can modify his trazodone, we could try a very very low-dose as needed benzodiazepine but I will defer to his new doctor at [facility] assisted living for this.  We discussed fall risks.  We discussed managing his vascular risk factors, and I am very happy that he has so much support between his daughter and his assisted living facility.  His daughter appears to be quite caring and knowledgeable.  He is eating well without swallowing difficulties.  No pain.  Reviewed notes, labs and imaging from outside physicians, which showed:  MRI of the brain (personally reviewed imaging) and agree with the following: 1. 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 No associated hemorrhage or mass effect. 2. Underlying moderately advanced age-related cerebral atrophy with mild chronic small vessel ischemic disease  Review of Systems: Patient complains of symptoms per HPI.  Pertinent negatives and positives per HPI. All others negative.   Social History   Socioeconomic History   Marital status: Divorced    Spouse name: Not on file   Number of children: 4   Years of education: Not on file   Highest education level: 8th grade  Occupational History   Not on file  Tobacco Use   Smoking status: Former    Years: 30.00    Types: Cigarettes    Quit date: 1994    Years since quitting: 29.0   Smokeless tobacco: Never  Vaping Use   Vaping Use: Never used  Substance and Sexual Activity   Alcohol use: No   Drug use: No   Sexual activity: Not Currently  Other Topics Concern   Not on file  Social History Narrative   4 kids (3 daughters and 1 son)   Daughter Forensic psychologist DPR      Lives at [facility]    Social Determinants of Health   Financial Resource Strain: Not on file  Food Insecurity: Not on  file  Transportation Needs: Not on file  Physical Activity: Not on file  Stress: Not on file  Social Connections: Not on file  Intimate Partner Violence: Not on file    Family History  Problem Relation Age of Onset   Diabetes Mother    Kidney disease Mother    Liver disease Mother    Heart Problems Mother    Stroke Neg Hx     Past Medical History:  Diagnosis Date   Anemia of chronic kidney failure    Arthritis    Bradycardia    Cataracts, bilateral 05/15/2012   Chronic idiopathic gout of multiple sites 02/10/2015   Chronic kidney disease    [facility]   Depression    Diabetes mellitus without complication ([place])    diet controlled    Diabetic nephropathy ([place])    ED (erectile dysfunction) 08/28/2012   Elevated liver enzymes 06/10/2019   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 HTN PLAN   BP goal is <140/90 BP Readings from Last 3 Encounters:  08/27/16 140/79  08/13/16 141/80  08/06/16 144/86   Current Anti-Hyptensive Medications: None  Today's Recommendations: Continue lifestyle modifications. Defer starting medication to PCP.  Recommend continued regular exercise as tolerated. Recomm   History of UTI    s/p foley in hospital 01/2019    Hyperlipidemia    Hypothyroidism    Illiterate 11/19/2013   Last Assessment & Plan:  Assisted patient with Handicapped Placard application today.     Insomnia 05/08/2019   Neuropathy    Peripheral neuropathy    Peripheral vascular disease (HCC)    Second degree heart block    Secondary hyperparathyroidism ([place])    Renal   Thyroid disease     Patient Active Problem List   Diagnosis Date Noted   Diarrhea, unspecified 01/26/2020   Allergic rhinitis, unspecified 12/02/2019   Fluid overload, unspecified 12/02/2019   Atrioventricular block, Mobitz type 1, Wenckebach     Left-sided weakness    Cerebrovascular accident (CVA) due to embolism of right carotid artery ([place])    Stenosis of right carotid artery    Arterial hypotension    Cerebral embolism with cerebral infarction 10/08/2019   Slurred speech 10/07/2019   Epiretinal membrane (ERM) of right eye 06/10/2019   Elevated liver enzymes 06/10/2019   Benign prostatic hyperplasia 05/08/2019   Insomnia 05/08/2019   Coronary artery disease involving native coronary artery of native heart without angina pectoris 05/08/2019   Atherosclerosis 05/08/2019   Hyperphosphatemia 05/08/2019   Secondary hyperparathyroidism ([place])    Hyperlipidemia  Gout    Anemia of chronic kidney failure    Near syncope 01/21/2019   Elevated troponin 01/21/2019   Osteoarthritis of midfoot, right 08/13/2016   Foot swelling 07/04/2016   DM type 2 with diabetic peripheral neuropathy ([place]) 11/28/2015   Pre-transplant evaluation for kidney transplant 11/28/2015   HLD (hyperlipidemia) 08/01/2015   Combined arterial insufficiency and corporo-venous occlusive erectile dysfunction 02/10/2015   Chronic idiopathic gout of multiple sites 02/10/2015   Illiterate 11/19/2013   ESRD (end stage renal disease) ([place]) 05/07/2013   Essential hypertension 05/07/2013   Osteoarthritis 05/07/2013   Diastasis recti 10/29/2012   Type 2 diabetes mellitus with retinopathy without macular edema ([place]) 10/17/2012   ED (erectile dysfunction) 08/28/2012   Cataracts, bilateral 05/15/2012   Chronic pain 05/15/2012   DM (diabetes mellitus) ([place]) 05/15/2012   Gout of knee 05/15/2012   Hyperkalemia 05/15/2012   Hypothyroidism 05/15/2012    Past Surgical History:  Procedure Laterality Date   AV FISTULA PLACEMENT Left 11/19/2017   Procedure: ARTERIOVENOUS (AV) FISTULA CREATION;  Surgeon: [name], MD;  Location: [facility];  Service: Vascular;  Laterality: Left;   [place] Left 06/04/2018   Procedure: BASILIC VEIN TRANSPOSITION  ARM;  Surgeon: [name], MD;  Location: [facility];  Service: Vascular;  Laterality: Left;   COLONOSCOPY     DIALYSIS/PERMA CATHETER REMOVAL N/A 03/23/2019   Procedure: DIALYSIS/PERMA CATHETER REMOVAL;  Surgeon: [name], MD;  Location: [facility];  Service: Cardiovascular;  Laterality: N/A;   EYE SURGERY     cataract surgery b/l; photophobia since    INSERTION OF DIALYSIS CATHETER N/A 01/23/2019   Procedure: INSERTION OF DIALYSIS CATHETER;  Surgeon: [name], MD;  Location: [facility];  Service: Vascular;  Laterality: N/A;  INSERTION OF DIALYSIS CATHETER   IR ANGIO INTRA EXTRACRAN SEL COM CAROTID INNOMINATE UNI L MOD SED  10/15/2019   IR ANGIO VERTEBRAL SEL SUBCLAVIAN INNOMINATE UNI R MOD SED  10/15/2019   IR CT HEAD LTD  10/15/2019   IR FLUORO GUIDE CV LINE RIGHT  10/08/2019   IR INTRAVSC STENT CERV CAROTID W/EMB-PROT MOD SED INCL ANGIO  10/15/2019   IR THROMBECTOMY AV FISTULA W/THROMBOLYSIS/PTA INC/SHUNT/IMG LEFT Left 10/09/2019   IR US GUIDE VASC ACCESS LEFT  10/09/2019   IR US GUIDE VASC ACCESS RIGHT  10/08/2019   KNEE ARTHROSCOPY Left    Knee   PERIPHERAL VASCULAR THROMBECTOMY Left 07/06/2019   Procedure: PERIPHERAL VASCULAR THROMBECTOMY;  Surgeon: [name], MD;  Location: [place] [facility];  Service: Cardiovascular;  Laterality: Left;   RADIOLOGY WITH ANESTHESIA N/A 10/15/2019   Procedure: stent placement;  Surgeon: Luanne Bras, MD;  Location: [facility];  Service: Radiology;  Laterality: N/A;   THROMBECTOMY W/ EMBOLECTOMY Left 01/27/2019   Procedure: THROMBECTOMY Left arm ARTERIOVENOUS FISTULA  and Left arm Arteriovenous gortex graft;  Surgeon: [name], MD;  Location: [facility];  Service: Vascular;  Laterality: Left;   VENOGRAM Left 01/27/2019   Procedure: Left arm FISTULOGRAM/;  Surgeon: [name], MD;  Location: [facility] OR;  Service: Vascular;  Laterality: Left;    Current Outpatient Medications  Medication Sig Dispense Refill   acetaminophen  (TYLENOL) 325 MG tablet Take 325 mg by mouth every 6 (six) hours as needed. 325mg  po q4hrs     albuterol (VENTOLIN HFA) 108 (90 Base) MCG/ACT inhaler Inhale into the lungs.     allopurinol (ZYLOPRIM) 100 MG tablet Take 1 tablet (100 mg total) by  mouth daily. 90 tablet 3   ALPRAZolam (XANAX) 0.25 MG tablet BID PRN     aspirin EC 81 MG tablet Take 81 mg daily by mouth.     atorvastatin (LIPITOR) 20 MG tablet Take 20 mg by mouth daily at 6 PM.      benzonatate (TESSALON) 200 MG capsule Take 200 mg by mouth 3 (three) times daily as needed for cough.     bisacodyl (DULCOLAX) 10 MG suppository Place 10 mg rectally as needed for moderate constipation.     Cholecalciferol (VITAMIN D) 50 MCG (2000 UT) tablet Take 2,000 Units by mouth daily.     docusate sodium (COLACE) 100 MG capsule Take 200 mg by mouth daily as needed.      donepezil (ARICEPT) 5 MG tablet Take 1 tablet (5 mg total) by mouth at bedtime. 30 tablet 6   levothyroxine (SYNTHROID) 175 MCG tablet Take 1 tablet (175 mcg total) by mouth daily before breakfast. (Patient taking differently: Take 200 mcg by mouth daily before breakfast.) 30 tablet 0   lidocaine-prilocaine (EMLA) cream Apply 1 application topically See admin instructions. Apply to access site 1 to 2 hours before dialysis. Cover with occlusive dressing.On Tuesday,Thursday and Saturday     Melatonin 3 MG CAPS Take by mouth. One tab po at bed time- Taking 10mg      midodrine (PROAMATINE) 10 MG tablet Take 1 tablet (10 mg total) by mouth 3 (three) times daily with meals. 270 tablet 3   ondansetron (ZOFRAN) 4 MG tablet Take 1 tablet (4 mg total) by mouth every 8 (eight) hours as needed. 20 tablet 0   pantoprazole (PROTONIX) 40 MG tablet Take 1 tablet (40 mg total) by mouth daily. 30 tablet 1   senna (SENOKOT) 8.6 MG TABS tablet Take 1 tablet by mouth.     sevelamer carbonate (RENVELA) 800 MG tablet Take 3 tablets (2,400 mg total) by mouth 3 (three) times daily with meals. 120 tablet 0    tamsulosin (FLOMAX) 0.4 MG CAPS capsule Take 1 capsule (0.4 mg total) by mouth daily. 30 capsule 1   ticagrelor (BRILINTA) 90 MG TABS tablet Take 1 tablet by mouth 2 (two) times daily.     tiZANidine (ZANAFLEX) 2 MG tablet Take 2 mg by mouth at bedtime.     traZODone (DESYREL) 50 MG tablet Take 0.5 tablets (25 mg total) by mouth at bedtime as needed for sleep. 90 tablet 3   Zinc Oxide (BALMEX EX) Apply 1 application topically daily as needed (to irritated areas of skin).      No current facility-administered medications for this visit.    Allergies as of 10/02/2021 - Review Complete 10/02/2021  Allergen Reaction Noted   Ace inhibitors Other (See Comments) 05/15/2012   Angiotensin receptor blockers Other (See Comments) 05/15/2012   Ivp dye [iodinated contrast media] Other (See Comments) 01/21/2019   Adhesive [tape] Other (See Comments) 01/21/2019    Vitals: Today's Vitals   10/02/21 1026  BP: (!) 151/73  Pulse: 71   There is no height or weight on file to calculate BMI.  Physical exam: General: well developed, well nourished,  very pleasant elderly Caucasian male, seated, in no evident distress Head: head normocephalic and atraumatic.   Neck: supple with no carotid or supraclavicular bruits Cardiovascular: regular rate and rhythm, no murmurs Musculoskeletal: no deformity Skin:  no rash/petichiae Vascular:  Normal pulses all extremities   Neurologic Exam Mental Status: Awake and fully alert.  Mild to moderate stuttering but no evidence of  aphasia or dysarthria.  Oriented to place and time. Recent memory impaired and remote memory intact. Attention span, concentration and fund of knowledge appropriate during visit. Mood and affect appropriate.  Cranial Nerves: Pupils equal, briskly reactive to light. Extraocular movements full without nystagmus. Visual fields full to confrontation. Hearing intact. Facial sensation intact. Face, tongue, palate moves normally and symmetrically.   Motor: Normal bulk and tone. Normal strength in all tested extremity muscles. Sensory.: intact to touch , pinprick , position and vibratory sensation.  Coordination: Rapid alternating movements normal in all extremities. Finger-to-nose and heel-to-shin performed accurately bilaterally. Gait and Station: deferred, in w/c. RW not present Reflexes: 1+ and symmetric. Toes downgoing.       Assessment/Plan:  81 y.o. male here as requested by McLean-Scocuzza, Olivia Mackie for hospital follow up regarding multiple bilateral white matter infarcts s/p stent angioplasty of right ICA 10/15/2019.  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     -Followed by Dr. Estanislado Pandy s/p R ICA stenting - completed repeat carotid ultrasound today. Advised to discuss ongoing need of Brilinta with Dr. Estanislado Pandy once results available -Continue aspirin and atorvastatin 20 mg daily for HLD management and secondary stroke prevention and maintain strict control of blood pressure with SBP goal 120-140 (to ensure adequate perfusion), HLD with LDL goal less than 70 and DM with A1c goal less than 7.0 -continue Aricept 5 mg nightly for dementia managed by ALF    Overall stable from stroke standpoint without further recommendations. Can follow up as needed    CC:  Verl Blalock, NP    I spent 32 minutes of face-to-face and non-face-to-face time with patient and daughter.  This included previsit chart review, lab review, study review, electronic health record documentation, patient education and  discussion regarding history of stroke as well as secondary stroke prevention measures and aggressive stroke risk factor management, right ICA stenosis s/p stent, ongoing use of above medications and answered all questions to patient and daughter satisfaction  Frann Rider, AGNP-BC  [facility] [address] [place], [place] [address]  Phone [phone] Fax [phone] Note: This document was prepared with digital dictation and possible smart phrase technology. Any transcriptional errors that result from this process are unintentional.

## 2021-10-02 NOTE — Progress Notes (Signed)
VASCULAR LAB    Right Carotid duplex has been performed.  See CV proc for preliminary results.   Anihya Tuma, RVT 10/02/2021, 9:33 AM

## 2021-10-02 NOTE — Patient Instructions (Signed)
Continue aspirin 81 mg daily  and atorvastatin for secondary stroke prevention  Continue Brilinta per Dr. Arlean Hopping recommendations - ensure you discuss ongoing need of Brilinta once ultrasound results are available   Continue to follow up with PCP regarding blood pressure and cholesterol management  Maintain strict control of hypertension with blood pressure goal below 130/90 and cholesterol with LDL cholesterol (bad cholesterol) goal below 70 mg/dL.   Signs of a Stroke? Follow the BEFAST method:  Balance Watch for a sudden loss of balance, trouble with coordination or vertigo Eyes Is there a sudden loss of vision in one or both eyes? Or double vision?  Face: Ask the person to smile. Does one side of the face droop or is it numb?  Arms: Ask the person to raise both arms. Does one arm drift downward? Is there weakness or numbness of a leg? Speech: Ask the person to repeat a simple phrase. Does the speech sound slurred/strange? Is the person confused ? Time: If you observe any of these signs, call 911.     Overall stable from stroke standpoint - can follow up as needed at this time      Thank you for coming to see [place] at [facility]. I hope we have been able to provide you high quality care today.  You may receive a patient satisfaction survey over the next few weeks. We would appreciate your feedback and comments so that we may continue to improve ourselves and the health of our patients.

## 2021-10-04 ENCOUNTER — Ambulatory Visit (INDEPENDENT_AMBULATORY_CARE_PROVIDER_SITE_OTHER)
Admission: RE | Admit: 2021-10-04 | Discharge: 2021-10-04 | Disposition: A | Discharge status: Home / Self Care | Payer: Medicare Other | Source: Ambulatory Visit | Attending: Vascular Surgery | Admitting: Vascular Surgery

## 2021-10-04 ENCOUNTER — Other Ambulatory Visit: Payer: Self-pay

## 2021-10-04 ENCOUNTER — Ambulatory Visit (INDEPENDENT_AMBULATORY_CARE_PROVIDER_SITE_OTHER): Payer: Medicare Other | Admitting: Vascular Surgery

## 2021-10-04 ENCOUNTER — Ambulatory Visit (HOSPITAL_COMMUNITY)
Admission: RE | Admit: 2021-10-04 | Discharge: 2021-10-04 | Disposition: A | Discharge status: Home / Self Care | Payer: Medicare Other | Source: Ambulatory Visit | Attending: Vascular Surgery | Admitting: Vascular Surgery

## 2021-10-04 VITALS — BP 116/70 | HR 76 | Temp 98.2°F | Resp 20 | Ht 68.0 in | Wt 208.0 lb

## 2021-10-04 DIAGNOSIS — N186 End stage renal disease: Secondary | ICD-10-CM

## 2021-10-04 NOTE — Progress Notes (Signed)
Patient ID: Frank [name], male   DOB: Jul 12, 1941, 81 y.o.   MRN: [id number]  Reason for Consult: Follow-up   Referred by Verl Blalock, NP  Subjective:     HPI:  Frank [name] is a 81 y.o. male previous left upper fistulogram with graft.  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  He is here to discuss access in his right upper extremity.  He denies any previous procedures in the right upper extremity.  Currently dialyzing via catheter.  He is on Brilinta and aspirin for carotid stenting.  Currently he is on dialysis Tuesdays, Thursdays and Saturdays.  Past Medical History:  Diagnosis Date   Anemia of chronic kidney failure    Arthritis    Bradycardia    Cataracts, bilateral 05/15/2012   Chronic idiopathic gout of multiple sites 02/10/2015   Chronic kidney disease    [facility]   Depression    Diabetes mellitus without complication ([place])    diet controlled    Diabetic nephropathy ([place])    ED (erectile dysfunction) 08/28/2012   Elevated liver enzymes 06/10/2019   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 HTN PLAN   BP goal is <140/90 BP Readings from Last 3 Encounters:  08/27/16 140/79  08/13/16 141/80  08/06/16 144/86   Current Anti-Hyptensive Medications: None  Today's Recommendations: Continue lifestyle modifications. Defer starting medication to PCP.  Recommend continued regular exercise as tolerated. Recomm   History of UTI    s/p foley in hospital 01/2019    Hyperlipidemia    Hypothyroidism    Illiterate 11/19/2013   Last Assessment & Plan:  Assisted patient with Handicapped Placard application today.     Insomnia 05/08/2019   Neuropathy    Peripheral neuropathy    Peripheral vascular disease (HCC)    Second degree heart block    Secondary  hyperparathyroidism ([place])    Renal   Thyroid disease    Family History  Problem Relation Age of Onset   Diabetes Mother    Kidney disease Mother    Liver disease Mother    Heart Problems Mother    Stroke Neg Hx    Past Surgical History:  Procedure Laterality Date   AV FISTULA PLACEMENT Left 11/19/2017   Procedure: ARTERIOVENOUS (AV) FISTULA CREATION;  Surgeon: [name], MD;  Location: [place];  Service: Vascular;  Laterality: Left;   [place] Left 06/04/2018   Procedure: BASILIC VEIN TRANSPOSITION ARM;  Surgeon: [name], MD;  Location: [place];  Service: Vascular;  Laterality: Left;   COLONOSCOPY     DIALYSIS/PERMA CATHETER REMOVAL N/A 03/23/2019   Procedure: DIALYSIS/PERMA CATHETER REMOVAL;  Surgeon: [name], MD;  Location: [facility];  Service: Cardiovascular;  Laterality: N/A;   EYE SURGERY     cataract surgery b/l; photophobia since    INSERTION OF DIALYSIS CATHETER N/A 01/23/2019   Procedure: INSERTION OF DIALYSIS CATHETER;  Surgeon: [name], MD;  Location: [facility];  Service: Vascular;  Laterality: N/A;  INSERTION OF DIALYSIS CATHETER   IR ANGIO INTRA EXTRACRAN SEL COM CAROTID INNOMINATE UNI L MOD SED  10/15/2019   IR ANGIO VERTEBRAL SEL SUBCLAVIAN INNOMINATE UNI R MOD SED  10/15/2019   IR CT HEAD LTD  10/15/2019   IR FLUORO GUIDE CV LINE RIGHT  10/08/2019   IR INTRAVSC STENT CERV CAROTID W/EMB-PROT MOD SED INCL ANGIO  10/15/2019   IR THROMBECTOMY AV FISTULA W/THROMBOLYSIS/PTA INC/SHUNT/IMG LEFT Left 10/09/2019   IR US GUIDE VASC ACCESS LEFT  10/09/2019   IR US GUIDE VASC ACCESS RIGHT  10/08/2019   KNEE ARTHROSCOPY Left    Knee   PERIPHERAL VASCULAR THROMBECTOMY Left 07/06/2019   Procedure: PERIPHERAL VASCULAR THROMBECTOMY;  Surgeon: [name], MD;  Location: [facility];  Service: Cardiovascular;  Laterality: Left;   RADIOLOGY WITH ANESTHESIA N/A 10/15/2019   Procedure: stent placement;  Surgeon: Luanne Bras, MD;  Location: [facility];  Service: Radiology;  Laterality: N/A;   THROMBECTOMY W/ EMBOLECTOMY Left 01/27/2019   Procedure: THROMBECTOMY Left arm ARTERIOVENOUS FISTULA  and Left arm Arteriovenous gortex graft;  Surgeon: [name], MD;  Location: [facility];  Service: Vascular;  Laterality: Left;   VENOGRAM Left 01/27/2019   Procedure: Left arm FISTULOGRAM/;  Surgeon: [name], MD;  Location: [place];  Service: Vascular;  Laterality: Left;    Short Social History:  Social History   Tobacco Use   Smoking status: Former    Years: 30.00    Types: Cigarettes    Quit date: 1994    Years since quitting: 29.0   Smokeless tobacco: Never  Substance Use Topics   Alcohol use: No    Allergies  Allergen Reactions   Ace Inhibitors Other (See Comments)    Hyperkalemia on ACE INHIBITORS Cr>1.9   Angiotensin Receptor Blockers Other (See Comments)    Hyperkalemia Elevates Cr > 1.9    Ivp Dye [Iodinated Contrast Media] Other (See Comments)    Cannot have due to renal failure   Adhesive [Tape] Other (See Comments)    UNDESCRIBED REACTION Can tolerate ONLY Coban wrap or mild paper tape!!    Current Outpatient Medications  Medication Sig Dispense Refill   acetaminophen (TYLENOL) 325 MG tablet Take 325 mg by mouth every 6 (six) hours as needed. 325mg  po q4hrs     albuterol (VENTOLIN HFA) 108 (90 Base) MCG/ACT inhaler Inhale into the lungs.     allopurinol (ZYLOPRIM) 100 MG tablet Take 1 tablet (100 mg total) by mouth daily. 90 tablet 3   ALPRAZolam (XANAX) 0.25 MG tablet Take 0.25 mg by mouth as needed. Prior to HD sessions     ALPRAZolam (XANAX) 0.5 MG tablet Take 0.25 mg by mouth daily with supper.     aspirin EC 81 MG tablet Take 81 mg daily by mouth.     atorvastatin (LIPITOR) 20 MG tablet Take 20 mg by mouth daily at 6 PM.      benzonatate (TESSALON) 200 MG capsule Take 200 mg by mouth 3 (three) times daily as needed for cough.     bisacodyl (DULCOLAX) 10 MG suppository Place  10 mg rectally as needed for moderate constipation.     Cholecalciferol (VITAMIN D) 50 MCG (2000 UT) tablet Take 2,000 Units by mouth daily.     dexamethasone (DECADRON) 4 MG tablet Take by mouth.     docusate sodium (COLACE) 100 MG capsule Take 200 mg by mouth daily as needed.      donepezil (ARICEPT) 5 MG tablet Take 1 tablet (5 mg total) by mouth at bedtime. 30 tablet 6   guaiFENesin (MUCINEX) 600 MG 12 hr tablet Take by mouth 2 (two) times daily as needed.     levothyroxine (SYNTHROID) 175 MCG tablet  Take 1 tablet (175 mcg total) by mouth daily before breakfast. (Patient taking differently: Take 200 mcg by mouth daily before breakfast.) 30 tablet 0   lidocaine-prilocaine (EMLA) cream Apply 1 application topically See admin instructions. Apply to access site 1 to 2 hours before dialysis. Cover with occlusive dressing.On Tuesday,Thursday and Saturday     Melatonin 10 MG SUBL Place 10 mg under the tongue at bedtime.     midodrine (PROAMATINE) 10 MG tablet Take 1 tablet (10 mg total) by mouth 3 (three) times daily with meals. 270 tablet 3   ondansetron (ZOFRAN) 4 MG tablet Take 1 tablet (4 mg total) by mouth every 8 (eight) hours as needed. 20 tablet 0   senna (SENOKOT) 8.6 MG TABS tablet Take 1 tablet by mouth.     sevelamer carbonate (RENVELA) 800 MG tablet Take 3 tablets (2,400 mg total) by mouth 3 (three) times daily with meals. 120 tablet 0   tamsulosin (FLOMAX) 0.4 MG CAPS capsule Take 1 capsule (0.4 mg total) by mouth daily. 30 capsule 1   ticagrelor (BRILINTA) 90 MG TABS tablet Take 1 tablet by mouth 2 (two) times daily.     tiZANidine (ZANAFLEX) 2 MG tablet Take 2 mg by mouth at bedtime.     traZODone (DESYREL) 50 MG tablet Take 50 mg by mouth at bedtime.     Zinc Oxide (BALMEX EX) Apply 1 application topically daily as needed (to irritated areas of skin).      pantoprazole (PROTONIX) 40 MG tablet Take 1 tablet (40 mg total) by mouth daily. 30 tablet 1   No current facility-administered  medications for this visit.    Review of Systems  Constitutional:  Constitutional negative. HENT: HENT negative.  Eyes: Eyes negative.  Respiratory: Respiratory negative.  Cardiovascular: Cardiovascular negative.  GI: Gastrointestinal negative.  Musculoskeletal: Musculoskeletal negative.  Neurological: Neurological negative. Hematologic: Positive for bruises/bleeds easily.  Psychiatric: Psychiatric negative.       Objective:  Objective  Vitals:   10/04/21 1009  BP: 116/70  Pulse: 76  Resp: 20  Temp: 98.2 F (36.8 C)  SpO2: 94%    Physical Exam HENT:     Head: Normocephalic.     Nose:     Comments: Wearing a mask Eyes:     Pupils: Pupils are equal, round, and reactive to light.  Cardiovascular:     Pulses:          Radial pulses are 2+ on the right side and 2+ on the left side.  Pulmonary:     Effort: Pulmonary effort is normal.  Abdominal:     General: Abdomen is flat.  Skin:    Capillary Refill: Capillary refill takes less than 2 seconds.  Neurological:     General: No focal deficit present.     Mental Status: He is alert.  Psychiatric:        Mood and Affect: Mood normal.        Behavior: Behavior normal.    Data: +-----------------+-------------+----------+---------+   Right Cephalic    Diameter (cm) Depth (cm) Findings    +-----------------+-------------+----------+---------+   Shoulder              0.24                             +-----------------+-------------+----------+---------+   Prox upper arm        0.23                             +-----------------+-------------+----------+---------+  Mid upper arm      0.21 / 0.37                         +-----------------+-------------+----------+---------+   Dist upper arm        0.30                             +-----------------+-------------+----------+---------+   Antecubital fossa     0.30                             +-----------------+-------------+----------+---------+   Prox forearm           0.38                  crosses    +-----------------+-------------+----------+---------+   Mid forearm           0.19                             +-----------------+-------------+----------+---------+   Dist forearm          0.22                 branching   +-----------------+-------------+----------+---------+   +-----------------+-------------+----------+--------+   Right Basilic     Diameter (cm) Depth (cm) Findings   +-----------------+-------------+----------+--------+   Prox upper arm        0.49                            +-----------------+-------------+----------+--------+   Mid upper arm      0.49 / 0.65                        +-----------------+-------------+----------+--------+   Dist upper arm        0.50                            +-----------------+-------------+----------+--------+   Antecubital fossa     0.43                            +-----------------+-------------+----------+--------+   Prox forearm       0.21 / 0.20                        +-----------------+-------------+----------+--------+   Summary: Right: Measurements above.  Left: Left arm not imaged. .   Right Pre-Dialysis Findings:  +-----------------------+----------+--------------------+---------+--------  +   Location                PSV (cm/s) Intralum. Diam. (cm) Waveform   Comments   +-----------------------+----------+--------------------+---------+--------  +   Brachial Antecub. 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  We have discussed proceeding with right upper extremity arteriovenous access possible basilic vein fistula versus graft if the vein is not suitable at the time of surgery.  I discussed the possible outcomes including need for 2 procedures with fistula and possible additional procedures to maintain patency of both fistula and graft.  We also discussed primary nonfunction and they demonstrate good understanding.  We will get him scheduled on a nondialysis day in the near future.    [name] MD [facility]

## 2021-10-04 NOTE — H&P (View-Only) (Signed)
Patient ID: Frank [name], male   DOB: 1941/01/05, 81 y.o.   MRN: [id number]  Reason for Consult: Follow-up   Referred by Verl Blalock, NP  Subjective:     HPI:  Frank [name] is a 81 y.o. male previous left upper fistulogram with graft.  Frank [name] had undergone graft thrombectomy on multiple occasions but the graft is now thrombosed.  Frank [name] is here to discuss access in his right upper extremity.  Frank [name] denies any previous procedures in the right upper extremity.  Currently dialyzing via catheter.  Frank [name] is on Brilinta and aspirin for carotid stenting.  Currently Frank [name] is on dialysis Tuesdays, Thursdays and Saturdays.  Past Medical History:  Diagnosis Date   Anemia of chronic kidney failure    Arthritis    Bradycardia    Cataracts, bilateral 05/15/2012   Chronic idiopathic gout of multiple sites 02/10/2015   Chronic kidney disease    [facility]   Depression    Diabetes mellitus without complication ([place])    diet controlled    Diabetic nephropathy ([place])    ED (erectile dysfunction) 08/28/2012   Elevated liver enzymes 06/10/2019   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 HTN PLAN   BP goal is <140/90 BP Readings from Last 3 Encounters:  08/27/16 140/79  08/13/16 141/80  08/06/16 144/86   Current Anti-Hyptensive Medications: None  Today's Recommendations: Continue lifestyle modifications. Defer starting medication to PCP.  Recommend continued regular exercise as tolerated. Recomm   History of UTI    s/p foley in hospital 01/2019    Hyperlipidemia    Hypothyroidism    Illiterate 11/19/2013   Last Assessment & Plan:  Assisted patient with Handicapped Placard application today.     Insomnia 05/08/2019   Neuropathy    Peripheral neuropathy    Peripheral vascular disease (HCC)    Second degree heart block    Secondary  hyperparathyroidism ([place])    Renal   Thyroid disease    Family History  Problem Relation Age of Onset   Diabetes Mother    Kidney disease Mother    Liver disease Mother    Heart Problems Mother    Stroke Neg Hx    Past Surgical History:  Procedure Laterality Date   AV FISTULA PLACEMENT Left 11/19/2017   Procedure: ARTERIOVENOUS (AV) FISTULA CREATION;  Surgeon: [name], MD;  Location: [place];  Service: Vascular;  Laterality: Left;   [place] Left 06/04/2018   Procedure: BASILIC VEIN TRANSPOSITION ARM;  Surgeon: [name], MD;  Location: [place];  Service: Vascular;  Laterality: Left;   COLONOSCOPY     DIALYSIS/PERMA CATHETER REMOVAL N/A 03/23/2019   Procedure: DIALYSIS/PERMA CATHETER REMOVAL;  Surgeon: [name], MD;  Location: [place] [facility];  Service: Cardiovascular;  Laterality: N/A;   EYE SURGERY     cataract surgery b/l; photophobia since    INSERTION OF DIALYSIS CATHETER N/A 01/23/2019   Procedure: INSERTION OF DIALYSIS CATHETER;  Surgeon: [name], MD;  Location: [facility];  Service: Vascular;  Laterality: N/A;  INSERTION OF DIALYSIS CATHETER   IR ANGIO INTRA EXTRACRAN SEL COM CAROTID INNOMINATE UNI L MOD SED  10/15/2019   IR ANGIO VERTEBRAL SEL SUBCLAVIAN INNOMINATE UNI R MOD SED  10/15/2019   IR CT HEAD LTD  10/15/2019   IR FLUORO GUIDE CV LINE RIGHT  10/08/2019   IR INTRAVSC STENT CERV CAROTID W/EMB-PROT MOD SED INCL ANGIO  10/15/2019   IR THROMBECTOMY AV FISTULA W/THROMBOLYSIS/PTA INC/SHUNT/IMG LEFT Left 10/09/2019   IR US GUIDE VASC ACCESS LEFT  10/09/2019   IR US GUIDE VASC ACCESS RIGHT  10/08/2019   KNEE ARTHROSCOPY Left    Knee   PERIPHERAL VASCULAR THROMBECTOMY Left 07/06/2019   Procedure: PERIPHERAL VASCULAR THROMBECTOMY;  Surgeon: [name], MD;  Location: [place] [facility];  Service: Cardiovascular;  Laterality: Left;   RADIOLOGY WITH ANESTHESIA N/A 10/15/2019   Procedure: stent placement;  Surgeon: Luanne Bras, MD;  Location: [place];  Service: Radiology;  Laterality: N/A;   THROMBECTOMY W/ EMBOLECTOMY Left 01/27/2019   Procedure: THROMBECTOMY Left arm ARTERIOVENOUS FISTULA  and Left arm Arteriovenous gortex graft;  Surgeon: [name], MD;  Location: [facility] OR;  Service: Vascular;  Laterality: Left;   VENOGRAM Left 01/27/2019   Procedure: Left arm FISTULOGRAM/;  Surgeon: [name], MD;  Location: [place];  Service: Vascular;  Laterality: Left;    Short Social History:  Social History   Tobacco Use   Smoking status: Former    Years: 30.00    Types: Cigarettes    Quit date: 1994    Years since quitting: 29.0   Smokeless tobacco: Never  Substance Use Topics   Alcohol use: No    Allergies  Allergen Reactions   Ace Inhibitors Other (See Comments)    Hyperkalemia on ACE INHIBITORS Cr>1.9   Angiotensin Receptor Blockers Other (See Comments)    Hyperkalemia Elevates Cr > 1.9    Ivp Dye [Iodinated Contrast Media] Other (See Comments)    Cannot have due to renal failure   Adhesive [Tape] Other (See Comments)    UNDESCRIBED REACTION Can tolerate ONLY Coban wrap or mild paper tape!!    Current Outpatient Medications  Medication Sig Dispense Refill   acetaminophen (TYLENOL) 325 MG tablet Take 325 mg by mouth every 6 (six) hours as needed. 325mg  po q4hrs     albuterol (VENTOLIN HFA) 108 (90 Base) MCG/ACT inhaler Inhale into the lungs.     allopurinol (ZYLOPRIM) 100 MG tablet Take 1 tablet (100 mg total) by mouth daily. 90 tablet 3   ALPRAZolam (XANAX) 0.25 MG tablet Take 0.25 mg by mouth as needed. Prior to HD sessions     ALPRAZolam (XANAX) 0.5 MG tablet Take 0.25 mg by mouth daily with supper.     aspirin EC 81 MG tablet Take 81 mg daily by mouth.     atorvastatin (LIPITOR) 20 MG tablet Take 20 mg by mouth daily at 6 PM.      benzonatate (TESSALON) 200 MG capsule Take 200 mg by mouth 3 (three) times daily as needed for cough.     bisacodyl (DULCOLAX) 10 MG suppository Place  10 mg rectally as needed for moderate constipation.     Cholecalciferol (VITAMIN D) 50 MCG (2000 UT) tablet Take 2,000 Units by mouth daily.     dexamethasone (DECADRON) 4 MG tablet Take by mouth.     docusate sodium (COLACE) 100 MG capsule Take 200 mg by mouth daily as needed.      donepezil (ARICEPT) 5 MG tablet Take 1 tablet (5 mg total) by mouth at bedtime. 30 tablet 6   guaiFENesin (MUCINEX) 600 MG 12 hr tablet Take by mouth 2 (two) times daily as needed.     levothyroxine (SYNTHROID) 175 MCG tablet  Take 1 tablet (175 mcg total) by mouth daily before breakfast. (Patient taking differently: Take 200 mcg by mouth daily before breakfast.) 30 tablet 0   lidocaine-prilocaine (EMLA) cream Apply 1 application topically See admin instructions. Apply to access site 1 to 2 hours before dialysis. Cover with occlusive dressing.On Tuesday,Thursday and Saturday     Melatonin 10 MG SUBL Place 10 mg under the tongue at bedtime.     midodrine (PROAMATINE) 10 MG tablet Take 1 tablet (10 mg total) by mouth 3 (three) times daily with meals. 270 tablet 3   ondansetron (ZOFRAN) 4 MG tablet Take 1 tablet (4 mg total) by mouth every 8 (eight) hours as needed. 20 tablet 0   senna (SENOKOT) 8.6 MG TABS tablet Take 1 tablet by mouth.     sevelamer carbonate (RENVELA) 800 MG tablet Take 3 tablets (2,400 mg total) by mouth 3 (three) times daily with meals. 120 tablet 0   tamsulosin (FLOMAX) 0.4 MG CAPS capsule Take 1 capsule (0.4 mg total) by mouth daily. 30 capsule 1   ticagrelor (BRILINTA) 90 MG TABS tablet Take 1 tablet by mouth 2 (two) times daily.     tiZANidine (ZANAFLEX) 2 MG tablet Take 2 mg by mouth at bedtime.     traZODone (DESYREL) 50 MG tablet Take 50 mg by mouth at bedtime.     Zinc Oxide (BALMEX EX) Apply 1 application topically daily as needed (to irritated areas of skin).      pantoprazole (PROTONIX) 40 MG tablet Take 1 tablet (40 mg total) by mouth daily. 30 tablet 1   No current facility-administered  medications for this visit.    Review of Systems  Constitutional:  Constitutional negative. HENT: HENT negative.  Eyes: Eyes negative.  Respiratory: Respiratory negative.  Cardiovascular: Cardiovascular negative.  GI: Gastrointestinal negative.  Musculoskeletal: Musculoskeletal negative.  Neurological: Neurological negative. Hematologic: Positive for bruises/bleeds easily.  Psychiatric: Psychiatric negative.       Objective:  Objective  Vitals:   10/04/21 1009  BP: 116/70  Pulse: 76  Resp: 20  Temp: 98.2 F (36.8 C)  SpO2: 94%    Physical Exam HENT:     Head: Normocephalic.     Nose:     Comments: Wearing a mask Eyes:     Pupils: Pupils are equal, round, and reactive to light.  Cardiovascular:     Pulses:          Radial pulses are 2+ on the right side and 2+ on the left side.  Pulmonary:     Effort: Pulmonary effort is normal.  Abdominal:     General: Abdomen is flat.  Skin:    Capillary Refill: Capillary refill takes less than 2 seconds.  Neurological:     General: No focal deficit present.     Mental Status: Frank [name] is alert.  Psychiatric:        Mood and Affect: Mood normal.        Behavior: Behavior normal.    Data: +-----------------+-------------+----------+---------+   Right Cephalic    Diameter (cm) Depth (cm) Findings    +-----------------+-------------+----------+---------+   Shoulder              0.24                             +-----------------+-------------+----------+---------+   Prox upper arm        0.23                             +-----------------+-------------+----------+---------+  Mid upper arm      0.21 / 0.37                         +-----------------+-------------+----------+---------+   Dist upper arm        0.30                             +-----------------+-------------+----------+---------+   Antecubital fossa     0.30                             +-----------------+-------------+----------+---------+   Prox forearm           0.38                  crosses    +-----------------+-------------+----------+---------+   Mid forearm           0.19                             +-----------------+-------------+----------+---------+   Dist forearm          0.22                 branching   +-----------------+-------------+----------+---------+   +-----------------+-------------+----------+--------+   Right Basilic     Diameter (cm) Depth (cm) Findings   +-----------------+-------------+----------+--------+   Prox upper arm        0.49                            +-----------------+-------------+----------+--------+   Mid upper arm      0.49 / 0.65                        +-----------------+-------------+----------+--------+   Dist upper arm        0.50                            +-----------------+-------------+----------+--------+   Antecubital fossa     0.43                            +-----------------+-------------+----------+--------+   Prox forearm       0.21 / 0.20                        +-----------------+-------------+----------+--------+   Summary: Right: Measurements above.  Left: Left arm not imaged. .   Right Pre-Dialysis Findings:  +-----------------------+----------+--------------------+---------+--------  +   Location                PSV (cm/s) Intralum. Diam. (cm) Waveform   Comments   +-----------------------+----------+--------------------+---------+--------  +   Brachial Antecub. 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  We have discussed proceeding with right upper extremity arteriovenous access possible basilic vein fistula versus graft if the vein is not suitable at the time of surgery.  I discussed the possible outcomes including need for 2 procedures with fistula and possible additional procedures to maintain patency of both fistula and graft.  We also discussed primary nonfunction and they demonstrate good understanding.  We will get him scheduled on a nondialysis day in the near future.    [name] MD [facility]

## 2021-10-09 ENCOUNTER — Telehealth (HOSPITAL_COMMUNITY): Payer: Self-pay

## 2021-10-09 NOTE — Telephone Encounter (Signed)
Called pt's daughter regarding recent imaging, no answer, no vm. Will try back later. AW

## 2021-10-25 ENCOUNTER — Encounter (HOSPITAL_COMMUNITY): Payer: Self-pay | Admitting: Vascular Surgery

## 2021-10-25 ENCOUNTER — Telehealth: Payer: Self-pay | Admitting: Physician Assistant

## 2021-10-25 ENCOUNTER — Other Ambulatory Visit: Payer: Self-pay

## 2021-10-25 NOTE — Progress Notes (Signed)
PCP - Irene Limbo, NP Cardiologist - denies  PPM/ICD - denies   Chest x-ray - 10/21/19 EKG - 01/10/21 Stress Test - denies ECHO - 10/07/19 Cardiac Cath - denies  Sleep Study - denies   DM- Type 2 Fasting Blood Sugar - 100-110 Checks Blood Sugar once a day  Blood Thinner Instructions: pt instructed to continue Brilinta and ASA thru DOS   ERAS Protcol -no, NPO   COVID TEST- n/a, ambulatory surgery   Anesthesia review: no  Patient denies shortness of breath, fever, cough and chest pain on phone call.   All instructions explained to the patient's daughter, with a verbal understanding of the material. She will go over instructions with the staff at her father's assisted living faciliy. Patient also instructed to wear a mask in public until surgery. The opportunity to ask questions was provided.  Pt's daughter [name] will need to accompany him DOS due to memory issues

## 2021-10-26 NOTE — Telephone Encounter (Signed)
Received message from IR scheduler stating patient's daughter had questions regarding Brilinta.  Patient is s/p right ICA stenting 10/15/19 with Dr. Estanislado Pandy.  Returned call to patient's daughter [name] today - she states that patient is undergoing new fistula or graft placement this Friday due to previous fistula becoming clotted due to low flor. She states vascular surgery told them to stay on Brilinta for this procedure. She is wondering if Brilinta will be helpful to prevent further clotting in the future - reviewed MOA of Brilinta, specifically that it is intended to discourage platelets sticking to stent to prevent narrowing/occlusion and that the benefit of this in the setting of clots 2/2 low blood flow is not known. Discussed that because Brilinta is not an anticoagulant it is not intended to prevent clot formation, although it is possible that it may have some benefit regarding clotting within the graft/fistula. [name] states understanding to this and denies any concerns with continuing Brilinta at this time.  Patient will undergo carotid artery duplex in 6 months for routine follow up (last duplex 10/02/21).  Candiss Norse, PA-C

## 2021-10-26 NOTE — Progress Notes (Signed)
Pt is a resident of a assisted living home.  Arrangements have been made for pt tp arrive to [facility].  Surgery was moved to 0916.  Per daughter [name]. 0715 is the earliest that pt can arrive.

## 2021-10-27 ENCOUNTER — Ambulatory Visit (HOSPITAL_COMMUNITY)
Admission: RE | Admit: 2021-10-27 | Discharge: 2021-10-27 | Disposition: A | Discharge status: Home / Self Care | Payer: Medicare Other | Source: Ambulatory Visit | Attending: Vascular Surgery | Admitting: Vascular Surgery

## 2021-10-27 ENCOUNTER — Encounter (HOSPITAL_COMMUNITY)
Admission: RE | Disposition: A | Discharge status: Home / Self Care | Payer: Self-pay | Source: Ambulatory Visit | Attending: Vascular Surgery

## 2021-10-27 ENCOUNTER — Ambulatory Visit (HOSPITAL_BASED_OUTPATIENT_CLINIC_OR_DEPARTMENT_OTHER): Payer: Medicare Other | Admitting: Anesthesiology

## 2021-10-27 ENCOUNTER — Other Ambulatory Visit (HOSPITAL_COMMUNITY): Payer: Self-pay

## 2021-10-27 ENCOUNTER — Encounter (HOSPITAL_COMMUNITY): Payer: Self-pay | Admitting: Vascular Surgery

## 2021-10-27 ENCOUNTER — Ambulatory Visit (HOSPITAL_COMMUNITY): Payer: Medicare Other | Admitting: Anesthesiology

## 2021-10-27 ENCOUNTER — Other Ambulatory Visit: Payer: Self-pay

## 2021-10-27 DIAGNOSIS — I2510 Atherosclerotic heart disease of native coronary artery without angina pectoris: Secondary | ICD-10-CM | POA: Diagnosis not present

## 2021-10-27 DIAGNOSIS — N186 End stage renal disease: Secondary | ICD-10-CM | POA: Insufficient documentation

## 2021-10-27 DIAGNOSIS — Z992 Dependence on renal dialysis: Secondary | ICD-10-CM | POA: Insufficient documentation

## 2021-10-27 DIAGNOSIS — E1122 Type 2 diabetes mellitus with diabetic chronic kidney disease: Secondary | ICD-10-CM | POA: Insufficient documentation

## 2021-10-27 DIAGNOSIS — Z7902 Long term (current) use of antithrombotics/antiplatelets: Secondary | ICD-10-CM | POA: Insufficient documentation

## 2021-10-27 DIAGNOSIS — Z7982 Long term (current) use of aspirin: Secondary | ICD-10-CM | POA: Diagnosis not present

## 2021-10-27 DIAGNOSIS — Z87891 Personal history of nicotine dependence: Secondary | ICD-10-CM | POA: Diagnosis not present

## 2021-10-27 DIAGNOSIS — I120 Hypertensive chronic kidney disease with stage 5 chronic kidney disease or end stage renal disease: Secondary | ICD-10-CM

## 2021-10-27 HISTORY — PX: AV FISTULA PLACEMENT: SHX1204

## 2021-10-27 HISTORY — DX: Benign prostatic hyperplasia without lower urinary tract symptoms: N40.0

## 2021-10-27 LAB — POCT I-STAT, CHEM 8
BUN: 27 mg/dL — ABNORMAL HIGH (ref 8–23)
Calcium, Ion: 1.15 mmol/L (ref 1.15–1.40)
Chloride: 97 mmol/L — ABNORMAL LOW (ref 98–111)
Creatinine, Ser: 5.5 mg/dL — ABNORMAL HIGH (ref 0.61–1.24)
Glucose, Bld: 108 mg/dL — ABNORMAL HIGH (ref 70–99)
HCT: 36 % — ABNORMAL LOW (ref 39.0–52.0)
Hemoglobin: 12.2 g/dL — ABNORMAL LOW (ref 13.0–17.0)
Potassium: 4.1 mmol/L (ref 3.5–5.1)
Sodium: 136 mmol/L (ref 135–145)
TCO2: 31 mmol/L (ref 22–32)

## 2021-10-27 LAB — GLUCOSE, CAPILLARY
Glucose-Capillary: 108 mg/dL — ABNORMAL HIGH (ref 70–99)
Glucose-Capillary: 122 mg/dL — ABNORMAL HIGH (ref 70–99)

## 2021-10-27 SURGERY — ARTERIOVENOUS (AV) FISTULA CREATION
Anesthesia: Regional | Site: Arm Upper | Laterality: Right

## 2021-10-27 MED ORDER — PHENYLEPHRINE 40 MCG/ML (10ML) SYRINGE FOR IV PUSH (FOR BLOOD PRESSURE SUPPORT)
PREFILLED_SYRINGE | INTRAVENOUS | Status: AC
  Filled 2021-10-27: qty 20

## 2021-10-27 MED ORDER — EPHEDRINE 5 MG/ML INJ
INTRAVENOUS | Status: AC
  Filled 2021-10-27: qty 5

## 2021-10-27 MED ORDER — PROPOFOL 500 MG/50ML IV EMUL
INTRAVENOUS | Status: DC | PRN
  Administered 2021-10-27: 25 ug/kg/min via INTRAVENOUS

## 2021-10-27 MED ORDER — FENTANYL CITRATE (PF) 100 MCG/2ML IJ SOLN: 50.0000 ug | Freq: Once | INTRAMUSCULAR | Status: AC

## 2021-10-27 MED ORDER — HYDROCODONE-ACETAMINOPHEN 5-325 MG PO TABS
1.0000 | ORAL_TABLET | Freq: Four times a day (QID) | ORAL | 0 refills | Status: DC | PRN
  Filled 2021-10-27: qty 8, 2d supply, fill #0

## 2021-10-27 MED ORDER — FENTANYL CITRATE (PF) 100 MCG/2ML IJ SOLN
INTRAMUSCULAR | Status: AC
  Administered 2021-10-27: 50 ug via INTRAVENOUS
  Filled 2021-10-27: qty 2

## 2021-10-27 MED ORDER — MEPERIDINE HCL 25 MG/ML IJ SOLN: 6.2500 mg | INTRAMUSCULAR | Status: DC | PRN

## 2021-10-27 MED ORDER — PHENYLEPHRINE HCL-NACL 20-0.9 MG/250ML-% IV SOLN
INTRAVENOUS | Status: DC | PRN
  Administered 2021-10-27: 20 ug/min via INTRAVENOUS

## 2021-10-27 MED ORDER — HEPARIN 6000 UNIT IRRIGATION SOLUTION
Status: DC | PRN
  Administered 2021-10-27: 1

## 2021-10-27 MED ORDER — ORAL CARE MOUTH RINSE: 15.0000 mL | Freq: Once | OROMUCOSAL | Status: AC

## 2021-10-27 MED ORDER — PROMETHAZINE HCL 25 MG/ML IJ SOLN: 6.2500 mg | INTRAMUSCULAR | Status: DC | PRN

## 2021-10-27 MED ORDER — SODIUM CHLORIDE 0.9 % IV SOLN: INTRAVENOUS | Status: DC

## 2021-10-27 MED ORDER — CHLORHEXIDINE GLUCONATE 4 % EX LIQD: 60.0000 mL | Freq: Once | CUTANEOUS | Status: DC

## 2021-10-27 MED ORDER — LIDOCAINE-EPINEPHRINE (PF) 1 %-1:200000 IJ SOLN
INTRAMUSCULAR | Status: AC
  Filled 2021-10-27: qty 30

## 2021-10-27 MED ORDER — ACETAMINOPHEN 500 MG PO TABS
1000.0000 mg | ORAL_TABLET | Freq: Once | ORAL | Status: AC
  Administered 2021-10-27: 1000 mg via ORAL
  Filled 2021-10-27: qty 2

## 2021-10-27 MED ORDER — EPHEDRINE SULFATE-NACL 50-0.9 MG/10ML-% IV SOSY
PREFILLED_SYRINGE | INTRAVENOUS | Status: DC | PRN
  Administered 2021-10-27 (×2): 10 mg via INTRAVENOUS
  Administered 2021-10-27: 5 mg via INTRAVENOUS

## 2021-10-27 MED ORDER — INSULIN ASPART 100 UNIT/ML IJ SOLN: 0.0000 [IU] | INTRAMUSCULAR | Status: DC | PRN

## 2021-10-27 MED ORDER — FENTANYL CITRATE (PF) 100 MCG/2ML IJ SOLN: 25.0000 ug | INTRAMUSCULAR | Status: DC | PRN

## 2021-10-27 MED ORDER — MIDAZOLAM HCL 2 MG/2ML IJ SOLN
INTRAMUSCULAR | Status: AC
  Filled 2021-10-27: qty 2

## 2021-10-27 MED ORDER — HEPARIN 6000 UNIT IRRIGATION SOLUTION
Status: AC
  Filled 2021-10-27: qty 500

## 2021-10-27 MED ORDER — 0.9 % SODIUM CHLORIDE (POUR BTL) OPTIME
TOPICAL | Status: DC | PRN
Start: 2021-10-27 — End: 2021-10-27
  Administered 2021-10-27: 1000 mL

## 2021-10-27 MED ORDER — CHLORHEXIDINE GLUCONATE 0.12 % MT SOLN
15.0000 mL | Freq: Once | OROMUCOSAL | Status: AC
  Administered 2021-10-27: 15 mL via OROMUCOSAL
  Filled 2021-10-27: qty 15

## 2021-10-27 MED ORDER — MEPIVACAINE HCL (PF) 1.5 % IJ SOLN
INTRAMUSCULAR | Status: DC | PRN
  Administered 2021-10-27: 20 mL via PERINEURAL

## 2021-10-27 MED ORDER — VASOPRESSIN 20 UNIT/ML IV SOLN
INTRAVENOUS | Status: DC | PRN
  Administered 2021-10-27 (×3): 1 [IU] via INTRAVENOUS

## 2021-10-27 MED ORDER — PHENYLEPHRINE HCL-NACL 20-0.9 MG/250ML-% IV SOLN: INTRAVENOUS | Status: DC | PRN

## 2021-10-27 MED ORDER — CEFAZOLIN SODIUM-DEXTROSE 2-4 GM/100ML-% IV SOLN
2.0000 g | INTRAVENOUS | Status: AC
  Administered 2021-10-27: 2 g via INTRAVENOUS
  Filled 2021-10-27: qty 100

## 2021-10-27 MED ORDER — PROPOFOL 10 MG/ML IV BOLUS
INTRAVENOUS | Status: DC | PRN
  Administered 2021-10-27: 20 mg via INTRAVENOUS

## 2021-10-27 MED ORDER — VASOPRESSIN 20 UNIT/ML IV SOLN
INTRAVENOUS | Status: AC
  Filled 2021-10-27: qty 1

## 2021-10-27 MED ORDER — PHENYLEPHRINE 40 MCG/ML (10ML) SYRINGE FOR IV PUSH (FOR BLOOD PRESSURE SUPPORT)
PREFILLED_SYRINGE | INTRAVENOUS | Status: DC | PRN
  Administered 2021-10-27: 120 ug via INTRAVENOUS
  Administered 2021-10-27: 40 ug via INTRAVENOUS
  Administered 2021-10-27: 120 ug via INTRAVENOUS
  Administered 2021-10-27: 20 ug via INTRAVENOUS
  Administered 2021-10-27: 120 ug via INTRAVENOUS

## 2021-10-27 SURGICAL SUPPLY — 30 items
ARMBAND PINK RESTRICT EXTREMIT (MISCELLANEOUS) ×2 IMPLANT
BAG COUNTER SPONGE SURGICOUNT (BAG) ×2 IMPLANT
CANISTER SUCT 3000ML PPV (MISCELLANEOUS) ×2 IMPLANT
CLIP LIGATING EXTRA MED SLVR (CLIP) ×2 IMPLANT
CLIP LIGATING EXTRA SM BLUE (MISCELLANEOUS) ×2 IMPLANT
COVER PROBE W GEL 5X96 (DRAPES) IMPLANT
DERMABOND ADVANCED (GAUZE/BANDAGES/DRESSINGS) ×1
DERMABOND ADVANCED .7 DNX12 (GAUZE/BANDAGES/DRESSINGS) ×1 IMPLANT
ELECT REM PT RETURN 9FT ADLT (ELECTROSURGICAL) ×2
ELECTRODE REM PT RTRN 9FT ADLT (ELECTROSURGICAL) ×1 IMPLANT
GLOVE SURG ENC MOIS LTX SZ7.5 (GLOVE) ×2 IMPLANT
GLOVE SURG MICRO LTX SZ6 (GLOVE) ×1 IMPLANT
GLOVE SURG POLYISO LF SZ6.5 (GLOVE) ×2 IMPLANT
GOWN STRL REUS W/ TWL LRG LVL3 (GOWN DISPOSABLE) ×2 IMPLANT
GOWN STRL REUS W/ TWL XL LVL3 (GOWN DISPOSABLE) ×1 IMPLANT
GOWN STRL REUS W/TWL LRG LVL3 (GOWN DISPOSABLE) ×2
GOWN STRL REUS W/TWL XL LVL3 (GOWN DISPOSABLE) ×1
INSERT FOGARTY SM (MISCELLANEOUS) IMPLANT
KIT BASIN OR (CUSTOM PROCEDURE TRAY) ×2 IMPLANT
KIT TURNOVER KIT B (KITS) ×2 IMPLANT
NS IRRIG 1000ML POUR BTL (IV SOLUTION) ×2 IMPLANT
PACK CV ACCESS (CUSTOM PROCEDURE TRAY) ×2 IMPLANT
PAD ARMBOARD 7.5X6 YLW CONV (MISCELLANEOUS) ×4 IMPLANT
SUT MNCRL AB 4-0 PS2 18 (SUTURE) ×2 IMPLANT
SUT PROLENE 6 0 BV (SUTURE) ×3 IMPLANT
SUT VIC AB 3-0 SH 27 (SUTURE) ×1
SUT VIC AB 3-0 SH 27X BRD (SUTURE) ×1 IMPLANT
TOWEL GREEN STERILE (TOWEL DISPOSABLE) ×2 IMPLANT
UNDERPAD 30X36 HEAVY ABSORB (UNDERPADS AND DIAPERS) ×2 IMPLANT
WATER STERILE IRR 1000ML POUR (IV SOLUTION) ×2 IMPLANT

## 2021-10-27 NOTE — Discharge Instructions (Signed)
° °  [facility]  Discharge Instructions  AV Fistula or Graft Surgery for Dialysis Access  Please refer to the following instructions for your post-procedure care. Your surgeon or physician assistant will discuss any changes with you.  Activity  You may drive the day following your surgery, if you are comfortable and no longer taking prescription pain medication. Resume full activity as the soreness in your incision resolves.  Bathing/Showering  You may shower after you go home. Keep your incision dry for 48 hours. Do not soak in a bathtub, hot tub, or swim until the incision heals completely. You may not shower if you have a hemodialysis catheter.  Incision Care  Clean your incision with mild soap and water after 48 hours. Pat the area dry with a clean towel. You do not need a bandage unless otherwise instructed. Do not apply any ointments or creams to your incision. You may have skin glue on your incision. Do not peel it off. It will come off on its own in about one week. Your arm may swell a bit after surgery. To reduce swelling use pillows to elevate your arm so it is above your heart. Your doctor will tell you if you need to lightly wrap your arm with an ACE bandage.  Diet  Resume your normal diet. There are not special food restrictions following this procedure. In order to heal from your surgery, it is CRITICAL to get adequate nutrition. Your body requires vitamins, minerals, and protein. Vegetables are the best source of vitamins and minerals. Vegetables also provide the perfect balance of protein. Processed food has little nutritional value, so try to avoid this.  Medications  Resume taking all of your medications. If your incision is causing pain, you may take over-the counter pain relievers such as acetaminophen (Tylenol). If you were prescribed a stronger pain medication, please be aware these medications can cause nausea and constipation. Prevent  nausea by taking the medication with a snack or meal. Avoid constipation by drinking plenty of fluids and eating foods with high amount of fiber, such as fruits, vegetables, and grains.  Do not take Tylenol if you are taking prescription pain medications.  Follow up Your surgeon may want to see you in the office following your access surgery. If so, this will be arranged at the time of your surgery.  Please call us immediately for any of the following conditions:  Increased pain, redness, drainage (pus) from your incision site Fever of 101 degrees or higher Severe or worsening pain at your incision site Hand pain or numbness.  Reduce your risk of vascular disease:  Stop smoking. If you would like help, call [facility] at 1-800-QUIT-NOW 225 240 5766) or [place] at [place] your cholesterol Maintain a desired weight Control your diabetes Keep your blood pressure down  Dialysis  It will take several weeks to several months for your new dialysis access to be ready for use. Your surgeon will determine when it is okay to use it. Your nephrologist will continue to direct your dialysis. You can continue to use your Permcath until your new access is ready for use.   10/27/2021 AVISHAI REIHL [id number] 1941-01-01  Surgeon(s): [name], MD  Procedure(s): CREATION OF BRACHIOCEPHALIC RIGHT ARM ARTERIOVENOUS (AV) FISTULA  x Do not stick fistula for 12 weeks    If you have any questions, please call the office at [phone].

## 2021-10-27 NOTE — Anesthesia Procedure Notes (Signed)
Procedure Name: MAC Date/Time: 10/27/2021 9:25 AM Performed by: Lorie Phenix, CRNA Pre-anesthesia Checklist: Patient identified, Emergency Drugs available, Suction available and Patient being monitored Patient Re-evaluated:Patient Re-evaluated prior to induction Oxygen Delivery Method: Simple face mask Placement Confirmation: positive ETCO2

## 2021-10-27 NOTE — Anesthesia Procedure Notes (Addendum)
Anesthesia Regional Block: Supraclavicular block   Pre-Anesthetic Checklist: , timeout performed,  Correct Patient, Correct Site, Correct Laterality,  Correct Procedure, Correct Position, site marked,  Risks and benefits discussed,  Surgical consent,  Pre-op evaluation,  At surgeon's request and post-op pain management  Laterality: Upper  Prep: chloraprep       Needles:  Injection technique: Single-shot  Needle Type: Stimiplex          Additional Needles:   Procedures:,,,, ultrasound used (permanent image in chart),,    Narrative:  Start time: 10/27/2021 8:27 AM End time: 10/27/2021 8:47 AM Injection made incrementally with aspirations every 5 mL.  Performed by: Personally  Anesthesiologist: Nolon Nations, MD  Additional Notes: BP cuff, SpO2 and EKG monitors applied. Sedation begun. Nerve location verified with ultrasound. Anesthetic injected incrementally, slowly, and after neg aspirations under direct u/s guidance. Good perineural spread. Tolerated well.

## 2021-10-27 NOTE — Op Note (Signed)
° ° °  Patient name: Frank [name] MRN: [id number] DOB: Oct 28, 1940 Sex: male  10/27/2021 Pre-operative Diagnosis: End-stage renal disease Post-operative diagnosis:  Same Surgeon:  Erlene Quan C. Donzetta Matters, MD Assistant: Leontine Locket, PA Procedure Performed:  Right brachial artery to cephalic vein AV fistula creation  Indications:  81 year-old male with history of end-stage renal disease currently on dialysis via catheter with previous left upper extremity fistulas and grafts that have failed.  He is now indicated for right upper extremity access.  An assistant was necessary to facilitate exposure and expedite the case.  Findings: Cephalic vein at the antecubital measured approximately 4 mm.  The brachial artery was healthy measuring 4 mm.  After creation of the fistula was a very strong thrill likely trace both physically and with Doppler of the upper arm and there was a palpable radial artery pulse at the wrist confirmed with Doppler that did augment with compression of the fistula.   Procedure:  The patient was identified in the holding area and taken to the operating room where is placed supine operative table and MAC anesthesia was induced.  He was sterilely prepped and draped in the right upper extremity usual fashion, antibiotics were ministered a timeout was called.  Ultrasound was used to identify what appeared to be a suitable cephalic vein and brachial artery at the level of the antecubital.  Preoperative block was performed and this was tested and noted to be intact.  A transverse incision was made between the vein and the artery.  We dissected out the vein dividing branches between ties and this was marked for orientation.  We dissected through the deep fascia the brachial artery and placed a vessel loop around this.  The vein was then transected distally and tied off and flushed with heparinized saline and clamped.  The artery was clamped distally and proximally opened longitudinally flushed with  heparinized saline in both directions.  The vein was then sewn into side with 6-0 Prolene suture.  Prior completion without flushing or directions.  Upon completion there was very strong thrill I can be traced up the upper arm both palpably and with Doppler.  There was a strong palpable radial artery pulse at the wrist confirmed with Doppler.  Some of the soft tissue was freed up around the vein to allow to seat nicely.  The wound was irrigated and then we closed over top the fistula with Vicryl and Monocryl.  Dermabond is placed at the skin level.  He was awakened from anesthesia having tolerated procedure without any complication.  All counts were correct at completion.  EBL: 20 cc    Verneal Wiers C. Donzetta Matters, MD [facility] [place] Office: [phone] Pager: [phone]

## 2021-10-27 NOTE — Transfer of Care (Signed)
Immediate Anesthesia Transfer of Care Note  Patient: Frank [name]  Procedure(s) Performed: CREATION OF BRACHIOCEPHALIC RIGHT ARM ARTERIOVENOUS (AV) FISTULA (Right: Arm Upper)  Patient Location: PACU  Anesthesia Type:MAC and Regional  Level of Consciousness: awake and alert   Airway & Oxygen Therapy: Patient Spontanous Breathing and Patient connected to face mask oxygen  Post-op Assessment: Report given to RN and Post -op Vital signs reviewed and stable  Post vital signs: Reviewed and stable  Last Vitals:  Vitals Value Taken Time  BP 133/71 10/27/21 1024  Temp    Pulse 76 10/27/21 1025  Resp 15 10/27/21 1025  SpO2 99 % 10/27/21 1025  Vitals shown include unvalidated device data.  Last Pain:  Vitals:   10/27/21 0900  TempSrc:   PainSc: 0-No pain      Patients Stated Pain Goal: 3 (55/83/16 7425)  Complications: Pt remains on neosynephrine in PACU.

## 2021-10-27 NOTE — Progress Notes (Signed)
Orthopedic Tech Progress Note Patient Details:  Frank [name] 1941/05/19 [id number]  Patient ID: [name], male   DOB: [birth year], 81 y.o.   MRN: [id number] Dropped arm sling off to nurse in PACU. [name] [name] 10/27/2021, 10:43 AM

## 2021-10-27 NOTE — Anesthesia Postprocedure Evaluation (Signed)
Anesthesia Post Note  Patient: Frank [name]  Procedure(s) Performed: CREATION OF BRACHIOCEPHALIC RIGHT ARM ARTERIOVENOUS (AV) FISTULA (Right: Arm Upper)     Patient location during evaluation: PACU Anesthesia Type: Regional Level of consciousness: awake and alert Pain management: pain level controlled Vital Signs Assessment: post-procedure vital signs reviewed and stable Respiratory status: spontaneous breathing Cardiovascular status: stable Anesthetic complications: no   No notable events documented.  Last Vitals:  Vitals:   10/27/21 1140 10/27/21 1155  BP: (!) 112/51 (!) 109/52  Pulse: 79 80  Resp: (!) 22 18  Temp:  36.6 C  SpO2: 93% 95%    Last Pain:  Vitals:   10/27/21 1155  TempSrc:   PainSc: 0-No pain                 Nolon Nations

## 2021-10-27 NOTE — Anesthesia Preprocedure Evaluation (Addendum)
Anesthesia Evaluation  Patient identified by MRN, date of birth, ID band Patient awake  General Assessment Comment:Patient awake  Reviewed: Allergy & Precautions, NPO status , Patient's Chart, lab work & pertinent test results  Airway Mallampati: III  TM Distance: >3 FB Neck ROM: Full    Dental  (+) Edentulous Upper, Edentulous Lower, Dental Advisory Given   Pulmonary former smoker,    Pulmonary exam normal breath sounds clear to auscultation       Cardiovascular hypertension, Pt. on medications pulmonary hypertension+ CAD and + Peripheral Vascular Disease  Normal cardiovascular exam+ dysrhythmias  Rhythm:Regular Rate:Normal  ECG: Accelerated Junctional rhythm Left axis deviation  ECHO: 11. Left ventricular ejection fraction, by visual estimation, is 60 to 65%. The left ventricle has normal function. There is moderately increased left ventricular hypertrophy.  2. Left ventricular diastolic parameters are indeterminate.  3. The left ventricle has no regional wall motion abnormalities.  4. Global right ventricle has mildly reduced systolic function.The right ventricular size is normal. No increase in right ventricular wall thickness.  5. Left atrial size was normal.  6. Right atrial size was normal.  7. The mitral valve is normal in structure. Trivial mitral valve regurgitation.  8. The tricuspid valve is normal in structure.  9. The tricuspid valve is normal in structure. Tricuspid valve regurgitation is trivial.  10. The aortic valve is tricuspid. Aortic valve regurgitation is not visualized. Mild to moderate aortic valve sclerosis/calcification without  any evidence of aortic stenosis.  11. The pulmonic valve was grossly normal. Pulmonic valve regurgitation is not visualized.  12. Moderately elevated pulmonary artery systolic pressure.  13. No intracardiac source of embolism detected on this transthoracic study. A  transesophageal echocardiogram is recommended to exclude cardiac source of embolism if clinically indicated.  14. The atrial septum is grossly normal.    Neuro/Psych PSYCHIATRIC DISORDERS Depression  Neuromuscular disease CVA, Residual Symptoms    GI/Hepatic   Endo/Other  diabetesHypothyroidism   Renal/GU ESRF and DialysisRenal disease (T, R, Sat)     Musculoskeletal  (+) Arthritis , Gout   Abdominal (+) + obese,   Peds  Hematology  (+) Blood dyscrasia, anemia , HLD   Anesthesia Other Findings stroke  Reproductive/Obstetrics                           Anesthesia Physical  Anesthesia Plan  ASA: 4  Anesthesia Plan: Regional   Post-op Pain Management: Regional block* and Tylenol PO (pre-op)*   Induction: Intravenous  PONV Risk Score and Plan: 1 and Dexamethasone, Treatment may vary due to age or medical condition and Propofol infusion  Airway Management Planned: Natural Airway and Nasal Cannula  Additional Equipment:   Intra-op Plan:   Post-operative Plan:   Informed Consent: I have reviewed the patients History and Physical, chart, labs and discussed the procedure including the risks, benefits and alternatives for the proposed anesthesia with the patient or authorized representative who has indicated his/her understanding and acceptance.     Dental advisory given  Plan Discussed with: CRNA  Anesthesia Plan Comments:     Anesthesia Quick Evaluation

## 2021-10-27 NOTE — Interval H&P Note (Signed)
History and Physical Interval Note:  10/27/2021 7:21 AM  Frank [name]  has presented today for surgery, with the diagnosis of ESRD.  The various methods of treatment have been discussed with the patient and family. After consideration of risks, benefits and other options for treatment, the patient has consented to  Procedure(s): RIGHT ARM ARTERIOVENOUS (AV) FISTULA VERSUS ARTERIOVENOUS GRAFT CREATION (Right) as a surgical intervention.  The patient's history has been reviewed, patient examined, no change in status, stable for surgery.  I have reviewed the patient's chart and labs.  Questions were answered to the patient's satisfaction.     Servando Snare

## 2021-10-28 ENCOUNTER — Encounter (HOSPITAL_COMMUNITY): Payer: Self-pay | Admitting: Vascular Surgery

## 2021-12-08 ENCOUNTER — Other Ambulatory Visit: Payer: Self-pay | Admitting: *Deleted

## 2021-12-08 DIAGNOSIS — N186 End stage renal disease: Secondary | ICD-10-CM

## 2021-12-13 ENCOUNTER — Ambulatory Visit (HOSPITAL_COMMUNITY)
Admission: RE | Admit: 2021-12-13 | Discharge: 2021-12-13 | Disposition: A | Discharge status: Home / Self Care | Payer: Medicare Other | Source: Ambulatory Visit | Attending: Vascular Surgery | Admitting: Vascular Surgery

## 2021-12-13 ENCOUNTER — Encounter: Payer: Self-pay | Admitting: Vascular Surgery

## 2021-12-13 ENCOUNTER — Ambulatory Visit (INDEPENDENT_AMBULATORY_CARE_PROVIDER_SITE_OTHER): Payer: Medicare Other | Admitting: Vascular Surgery

## 2021-12-13 VITALS — BP 131/71 | HR 69 | Temp 98.4°F | Resp 20 | Ht 68.0 in | Wt 191.0 lb

## 2021-12-13 DIAGNOSIS — N186 End stage renal disease: Secondary | ICD-10-CM | POA: Insufficient documentation

## 2021-12-13 NOTE — Progress Notes (Signed)
?  ? ?Subjective:  ?  ? Patient ID: Frank [name], male   DOB: Jul 06, 1941, 81 y.o.   MRN: [id number] ? ?HPI 81 year old male follows up after right brachial artery cephalic vein AV fistula.  Incisions of all healed well.  He has no complaints today. ? ? ?Review of Systems ?No complaints ?   ?Objective:  ? Physical Exam ?Vitals:  ? 12/13/21 1437  ?BP: 131/71  ?Pulse: 69  ?Resp: 20  ?Temp: 98.4 ?F (36.9 ?C)  ?SpO2: 93%  ? ?Awake alert oriented ?Nonlabored respirations ?Right upper arm with very strong thrill and well-healed incision. ? ? ?AVF                 PSV (cm/s)Flow Vol (mL/min)Comments  ?+--------------------+----------+-----------------+--------+  ?Native artery inflow   185           961                 ?+--------------------+----------+-----------------+--------+  ?AVF Anastomosis        431                               ?+--------------------+----------+-----------------+--------+  ? ?   ?+------------+----------+-------------+----------+-------------------------  ?----+  ?OUTFLOW VEINPSV (cm/s)Diameter (cm)Depth (cm)          Describe        ?      ?+------------+----------+-------------+----------+-------------------------  ?----+  ?Shoulder       135        0.61        0.30                             ?      ?+------------+----------+-------------+----------+-------------------------  ?----+  ?Prox UA        104        0.66        0.25                             ?      ?+------------+----------+-------------+----------+-------------------------  ?----+  ?Mid UA         134        0.75        0.34         competing branch    ?      ?+------------+----------+-------------+----------+-------------------------  ?----+  ?Dist UA        728        0.70        0.31   change in Diameter/  ?Velocity   ?                                                       increase        ?      ?+------------+----------+-------------+----------+-------------------------   ?----+  ?AC Fossa       224        0.89        0.17                             ?      ?+------------+----------+-------------+----------+-------------------------  ?----+  ? ?   ? ?   ?  Summary:  ?Patent right AV fistula. An area of diameter change with elevated  ?velocities is noted in the distal upper arm.  ?   ?Assessment:  ?   ?81 year old male with end-stage renal disease now status post right upper arm AV fistula creation. ?   ?Plan:  ?   ?Fistula maturing well should be suitable for cannulation May 1. ?He can follow-up with me on as-needed basis. ?   ?[place] Ursua C. Donzetta Matters, MD ?[facility] ?Office: [phone] ?Pager: [phone] ? ?

## 2022-04-05 NOTE — Progress Notes (Addendum)
PCP:  Verl Blalock, NP Primary Cardiologist: Thompson Grayer, MD Electrophysiologist: Thompson Grayer, MD   Frank [name] is a 81 y.o. male seen today for Thompson Grayer, MD for routine electrophysiology followup.  Since last being seen in our clinic the patient reports doing well from a cardiac perspective.   Daughter brings in an echo from November that mentions " atrial fibrillation" as his rhythm. No history of same.   In atrial flutter today on EKG, new diagnosis.  No change in functional. Has occasional chest pain that he describes as reflux, mainly only when he eats a particular type of sausage. he denies exertional chest pain, palpitations, dyspnea, PND, orthopnea, nausea, vomiting, dizziness, syncope, edema, weight gain, or early satiety.  Past Medical History:  Diagnosis Date   Anemia of chronic kidney failure    Arthritis    Benign prostate hyperplasia    Bradycardia    Cataracts, bilateral 05/15/2012   Chronic idiopathic gout of multiple sites 02/10/2015   Chronic kidney disease    [facility]- ESRD   Depression    Diabetes mellitus without complication ([place])    diet controlled    Diabetic nephropathy ([place])    ED (erectile dysfunction) 08/28/2012   Elevated liver enzymes 06/10/2019   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 HTN PLAN   BP goal is <140/90 BP Readings from Last 3 Encounters:  08/27/16 140/79  08/13/16 141/80  08/06/16 144/86   Current Anti-Hyptensive Medications: None  Today's Recommendations: Continue lifestyle modifications. Defer starting medication to PCP.  Recommend continued regular exercise as tolerated. Recomm   History of UTI    s/p foley in hospital 01/2019    Hyperlipidemia    Hypothyroidism    Illiterate 11/19/2013   Last Assessment & Plan:  Assisted patient with Handicapped  Placard application today.     Insomnia 05/08/2019   Neuropathy    Peripheral neuropathy    Peripheral vascular disease ([place])    Second degree heart block    Secondary hyperparathyroidism ([place])    Renal   Stroke ([place]) 2021   Thyroid disease    Past Surgical History:  Procedure Laterality Date   AV FISTULA PLACEMENT Left 11/19/2017   Procedure: ARTERIOVENOUS (AV) FISTULA CREATION;  Surgeon: [name], MD;  Location: [place];  Service: Vascular;  Laterality: Left;   AV FISTULA PLACEMENT Right 10/27/2021   Procedure: CREATION OF BRACHIOCEPHALIC RIGHT ARM ARTERIOVENOUS (AV) FISTULA;  Surgeon: [name], MD;  Location: [place];  Service: Vascular;  Laterality: Right;   [place] Left 06/04/2018   Procedure: BASILIC VEIN TRANSPOSITION ARM;  Surgeon: [name], MD;  Location: [place];  Service: Vascular;  Laterality: Left;   COLONOSCOPY     DIALYSIS/PERMA CATHETER REMOVAL N/A 03/23/2019   Procedure: DIALYSIS/PERMA CATHETER REMOVAL;  Surgeon: [name], MD;  Location: [place] [facility];  Service: Cardiovascular;  Laterality: N/A;   EYE SURGERY     cataract surgery b/l; photophobia since    INSERTION OF DIALYSIS CATHETER N/A 01/23/2019   Procedure: INSERTION OF DIALYSIS CATHETER;  Surgeon: [name], MD;  Location: [facility] OR;  Service: Vascular;  Laterality: N/A;  INSERTION OF DIALYSIS CATHETER   INSERTION OF DIALYSIS CATHETER  2022   right subclavian (chest area)   IR ANGIO  INTRA EXTRACRAN SEL COM CAROTID INNOMINATE UNI L MOD SED  10/15/2019   IR ANGIO VERTEBRAL SEL SUBCLAVIAN INNOMINATE UNI R MOD SED  10/15/2019   IR CT HEAD LTD  10/15/2019   IR FLUORO GUIDE CV LINE RIGHT  10/08/2019   IR INTRAVSC STENT CERV CAROTID W/EMB-PROT MOD SED INCL ANGIO  10/15/2019   IR THROMBECTOMY AV FISTULA W/THROMBOLYSIS/PTA INC/SHUNT/IMG LEFT Left 10/09/2019   IR US GUIDE VASC ACCESS LEFT  10/09/2019   IR US GUIDE VASC ACCESS RIGHT   10/08/2019   KNEE ARTHROSCOPY Left    Knee   PERIPHERAL VASCULAR THROMBECTOMY Left 07/06/2019   Procedure: PERIPHERAL VASCULAR THROMBECTOMY;  Surgeon: [name], MD;  Location: [place] [facility];  Service: Cardiovascular;  Laterality: Left;   RADIOLOGY WITH ANESTHESIA N/A 10/15/2019   Procedure: stent placement;  Surgeon: Luanne Bras, MD;  Location: [place];  Service: Radiology;  Laterality: N/A;   THROMBECTOMY W/ EMBOLECTOMY Left 01/27/2019   Procedure: THROMBECTOMY Left arm ARTERIOVENOUS FISTULA  and Left arm Arteriovenous gortex graft;  Surgeon: [name], MD;  Location: [facility] OR;  Service: Vascular;  Laterality: Left;   VENOGRAM Left 01/27/2019   Procedure: Left arm FISTULOGRAM/;  Surgeon: [name], MD;  Location: [facility] OR;  Service: Vascular;  Laterality: Left;    Current Outpatient Medications  Medication Sig Dispense Refill   acetaminophen (TYLENOL) 325 MG tablet Take 650 mg by mouth every 6 (six) hours as needed for moderate pain.     albuterol (VENTOLIN HFA) 108 (90 Base) MCG/ACT inhaler Inhale 2 puffs into the lungs every 6 (six) hours as needed for shortness of breath.     allopurinol (ZYLOPRIM) 100 MG tablet Take 1 tablet (100 mg total) by mouth daily. 90 tablet 3   ALPRAZolam (XANAX) 0.25 MG tablet Take 0.25 mg by mouth daily with supper.     ALPRAZolam (XANAX) 0.5 MG tablet Take 0.25 mg by mouth Every Tuesday,Thursday,and Saturday with dialysis.     aspirin 81 MG chewable tablet Chew 81 mg by mouth daily.     atorvastatin (LIPITOR) 20 MG tablet Take 20 mg by mouth daily.     benzonatate (TESSALON) 200 MG capsule Take 200 mg by mouth 3 (three) times daily as needed for cough.     bisacodyl (DULCOLAX) 10 MG suppository Place 10 mg rectally as needed for moderate constipation.     Camphor-Menthol (FREEZE IT EX) Apply 1 application topically every 6 (six) hours as needed (back and neck pain).     Cholecalciferol (VITAMIN D) 50 MCG (2000 UT) tablet Take 2,000  Units by mouth daily.     docusate sodium (COLACE) 100 MG capsule Take 200 mg by mouth daily as needed for moderate constipation.     donepezil (ARICEPT) 5 MG tablet Take 1 tablet (5 mg total) by mouth at bedtime. 30 tablet 6   guaiFENesin (MUCINEX) 600 MG 12 hr tablet Take 600 mg by mouth every 12 (twelve) hours as needed (congestion).     HYDROcodone-acetaminophen (NORCO) 5-325 MG tablet Take 1 tablet by mouth every 6 (six) hours as needed for moderate pain. 8 tablet 0   levothyroxine (SYNTHROID) 200 MCG tablet Take 200 mcg by mouth daily before breakfast.     lidocaine-prilocaine (EMLA) cream Apply 1 application topically See admin instructions. Apply to access site 1 to 2 hours before dialysis. Cover with occlusive dressing.On Tuesday,Thursday and Saturday     Melatonin 10 MG TABS Take 10 mg by mouth at bedtime.  midodrine (PROAMATINE) 10 MG tablet Take 1 tablet (10 mg total) by mouth 3 (three) times daily with meals. (Patient taking differently: Take 10 mg by mouth 3 (three) times daily with meals. Use as directed) 270 tablet 3   ondansetron (ZOFRAN) 4 MG tablet Take 1 tablet (4 mg total) by mouth every 8 (eight) hours as needed. 20 tablet 0   senna (SENOKOT) 8.6 MG TABS tablet Take 2 tablets by mouth at bedtime as needed for moderate constipation.     sevelamer carbonate (RENVELA) 800 MG tablet Take 3 tablets (2,400 mg total) by mouth 3 (three) times daily with meals. 120 tablet 0   Skin Protectants, Misc. (MINERIN CREME EX) Apply 1 application topically daily. Apply to both arms and legs     tamsulosin (FLOMAX) 0.4 MG CAPS capsule Take 1 capsule (0.4 mg total) by mouth daily. 30 capsule 1   ticagrelor (BRILINTA) 90 MG TABS tablet Take 90 mg by mouth 2 (two) times daily.     tiZANidine (ZANAFLEX) 2 MG tablet Take 2 mg by mouth at bedtime.     traZODone (DESYREL) 50 MG tablet Take 50 mg by mouth at bedtime.     umeclidinium-vilanterol (ANORO ELLIPTA) 62.5-25 MCG/ACT AEPB Inhale 1 puff into  the lungs daily.     zinc oxide (BALMEX) 11.3 % CREA cream Apply 1 application topically See admin instructions. Apply to bottom and groin area 3 times daily and as needed for incontinence care     pantoprazole (PROTONIX) 40 MG tablet Take 1 tablet (40 mg total) by mouth daily. (Patient taking differently: Take 40 mg by mouth daily before breakfast.) 30 tablet 1   No current facility-administered medications for this visit.    Allergies  Allergen Reactions   Ace Inhibitors Other (See Comments)    Hyperkalemia on ACE INHIBITORS Cr>1.9   Angiotensin Receptor Blockers Other (See Comments)    Hyperkalemia Elevates Cr > 1.9    Ivp Dye [Iodinated Contrast Media] Other (See Comments)    Cannot have due to renal failure   Adhesive [Tape] Other (See Comments)    UNDESCRIBED REACTION Can tolerate ONLY Coban wrap or mild paper tape!!    Social History   Socioeconomic History   Marital status: Divorced    Spouse name: Not on file   Number of children: 4   Years of education: Not on file   Highest education level: 8th grade  Occupational History   Not on file  Tobacco Use   Smoking status: Former    Years: 30.00    Types: Cigarettes    Quit date: 1994    Years since quitting: 29.5   Smokeless tobacco: Never  Vaping Use   Vaping Use: Never used  Substance and Sexual Activity   Alcohol use: No   Drug use: No   Sexual activity: Not Currently  Other Topics Concern   Not on file  Social History Narrative   4 kids (3 daughters and 1 son)   Daughter [name] DPR      Lives at [place] Determinants of Health   Financial Resource Strain: Low Risk  (07/06/2019)   Overall Financial Resource Strain (CARDIA)    Difficulty of Paying Living Expenses: Not hard at all  Food Insecurity: No Food Insecurity (07/06/2019)   Hunger Vital Sign    Worried About Running Out of Food in the Last Year: Never true    Ran Out of Food in the Last Year: Never  true   Transportation Needs: No Transportation Needs (07/06/2019)   PRAPARE - Hydrologist (Medical): No    Lack of Transportation (Non-Medical): No  Physical Activity: Not on file  Stress: No Stress Concern Present (07/06/2019)   [place]    Feeling of Stress : Only a little  Social Connections: Unknown (07/06/2019)   Social Connection and Isolation Panel [NHANES]    Frequency of Communication with Friends and Family: More than three times a week    Frequency of Social Gatherings with Friends and Family: Not on file    Attends Religious Services: Not on file    Active Member of Clubs or Organizations: Not on file    Attends Archivist Meetings: Not on file    Marital Status: Divorced  Intimate Partner Violence: Not At Risk (07/06/2019)   Humiliation, Afraid, Rape, and Kick questionnaire    Fear of Current or Ex-Partner: No    Emotionally Abused: No    Physically Abused: No    Sexually Abused: No     Review of Systems: All other systems reviewed and are otherwise negative except as noted above.  Physical Exam: Vitals:   04/09/22 1050  BP: (!) 110/56  Pulse: 66  SpO2: 97%  Weight: 192 lb (87.1 kg)  Height: 5\' 8"  (1.727 m)    GEN- The patient is well appearing, alert and oriented x 3 today.   HEENT: normocephalic, atraumatic; sclera clear, conjunctiva pink; hearing intact; oropharynx clear; neck supple, no JVP Lymph- no cervical lymphadenopathy Lungs- Clear to ausculation bilaterally, normal work of breathing.  No wheezes, rales, rhonchi Heart- Regular rate and rhythm, no murmurs, rubs or gallops, PMI not laterally displaced GI- soft, non-tender, non-distended, bowel sounds present, no hepatosplenomegaly Extremities- no clubbing, cyanosis, or edema; DP/PT/radial pulses 2+ bilaterally MS- no significant deformity or atrophy Skin- warm and dry, no rash or lesion Psych-  euthymic mood, full affect Neuro- strength and sensation are intact  EKG is ordered. Personal review of EKG from today shows Atrial flutter at 66 bpm  Additional studies reviewed include: Previous EP office notes.   Assessment and Plan:  1.  Conduction system disease 2. Atrial flutter, new diagnosis [id number]  > 7 EKG today shows atrial flutter which is a new diagnosis.  Will need to consider [place].  Given carotid disease and need for brilinta. Will discuss with vascular best regimen. ? If stop ASA and continue Brilinta + Eliquis 2.5 to see how he tolerates.  No symptomatic bradycardia No current indication for pacemaker implant. He would be a poor candidate for invasive procedures with increased infection risk   3.  Hypotension during HD Continue midodrine   4.  Urinary retention Followed by urology    5.  ESRD on HD Echo from 07/2021 showed normal LVEF Volume per HD.     Follow up with EP APP in  6 weeks.  Discussed with Dr. Curt Bears. Given history of carotid disease with stenting, we will confer with vascular to determine best regimen given need for anti-platelet therapy. Message sent to Dr. Donzetta Matters.   Shirley Friar, PA-C  04/09/22 10:58 AM   ADDENDUM Discussed with Dr. Donzetta Matters.  STOP Brilinta START Eliquis 2.5 mg BID CONTINUE Aspirin 81 mg daily  [employer]" [place], [place]  04/26/2022 12:12 PM

## 2022-04-09 ENCOUNTER — Ambulatory Visit (INDEPENDENT_AMBULATORY_CARE_PROVIDER_SITE_OTHER): Payer: Medicare Other | Admitting: Student

## 2022-04-09 VITALS — BP 110/56 | HR 66 | Ht 68.0 in | Wt 192.0 lb

## 2022-04-09 DIAGNOSIS — I6521 Occlusion and stenosis of right carotid artery: Secondary | ICD-10-CM | POA: Diagnosis not present

## 2022-04-09 DIAGNOSIS — I440 Atrioventricular block, first degree: Secondary | ICD-10-CM

## 2022-04-09 DIAGNOSIS — I959 Hypotension, unspecified: Secondary | ICD-10-CM

## 2022-04-09 DIAGNOSIS — R339 Retention of urine, unspecified: Secondary | ICD-10-CM

## 2022-04-09 DIAGNOSIS — I4892 Unspecified atrial flutter: Secondary | ICD-10-CM

## 2022-04-09 DIAGNOSIS — N186 End stage renal disease: Secondary | ICD-10-CM

## 2022-04-09 NOTE — Patient Instructions (Signed)
Medication Instructions:  Your physician recommends that you continue on your current medications as directed. Please refer to the Current Medication list given to you today.  *If you need a refill on your cardiac medications before your next appointment, please call your pharmacy*   Lab Work: None  If you have labs (blood work) drawn today and your tests are completely normal, you will receive your results only by: [place] (if you have MyChart) OR A paper copy in the mail If you have any lab test that is abnormal or we need to change your treatment, we will call you to review the results.    Follow-Up: At [facility], you and your health needs are our priority.  As part of our continuing mission to provide you with exceptional heart care, we have created designated Provider Care Teams.  These Care Teams include your primary Cardiologist (physician) and Advanced Practice Providers (APPs -  Physician Assistants and Nurse Practitioners) who all work together to provide you with the care you need, when you need it.   Your next appointment:   05/21/2022

## 2022-04-20 ENCOUNTER — Telehealth: Payer: Self-pay

## 2022-04-20 NOTE — Telephone Encounter (Signed)
-----   Message from Shirley Friar, PA-C sent at 04/16/2022  2:58 PM EDT ----- Per Dr. Donzetta Matters, can STOP Brilinta, and be on ASA 81 + Eliquis 2.5 mg BID for Atrial flutter/fib.       ----- Message ----- From: [name], MD Sent: 04/16/2022   2:37 PM EDT To: Shirley Friar, PA-C  Now that he is over 3 months out can be on either Brilinta or aspirin with Eliquis.  Just needs 1 antiplatelet agent from stent standpoint.  Erlene Quan ----- Message ----- From: Shirley Friar, Hershal Coria Sent: 04/09/2022  12:24 PM EDT To: [name], MD  Pt you follow with new diagnosis of atrial flutter. Plan will be for rate control.   Given h/o Carotid stenting on Brilinta and ASA; what would be the best regimen if we added Eliquis?  Would you recommend Brilinta + Eliquis and stop the ASA?  Confirmed with Pharm-D we can still use dose adjusted Eliquis in HD patients who otherwise meet the criteria.   Thank you! Legrand Como [address]" [place], [place]  04/09/2022 12:24 PM

## 2022-04-20 NOTE — Telephone Encounter (Signed)
Spoke with pt's Daughter and she is aware of the change. I will fwd these changes to [place].

## 2022-04-23 NOTE — Telephone Encounter (Signed)
LM with receptionist to have someone return my call to make them aware of this medication change. Whoever I needed to speak with was out of the office at the time.

## 2022-04-26 NOTE — Telephone Encounter (Signed)
Facility is aware of medication change. I will fax order to them @ [phone].

## 2022-04-27 ENCOUNTER — Other Ambulatory Visit: Payer: Self-pay

## 2022-04-27 ENCOUNTER — Telehealth: Payer: Self-pay | Admitting: Internal Medicine

## 2022-04-27 MED ORDER — APIXABAN 2.5 MG PO TABS: 2.5000 mg | ORAL_TABLET | Freq: Two times a day (BID) | ORAL | 3 refills | Status: DC

## 2022-04-27 NOTE — Telephone Encounter (Signed)
Pt c/o medication issue:  1. Name of Medication:   Eliquis  2. How are you currently taking this medication (dosage and times per day)? No taking yet  3. Are you having a reaction (difficulty breathing--STAT)?   4. What is your medication issue?   Caller stated patient is currently in their facility and  will need a prescription for Eliquis.  Caller stated patient was on ticagrelor (BRILINTA) 90 MG TABS tablet but has had this medication change.

## 2022-04-27 NOTE — Telephone Encounter (Signed)
Spoke with [name].Marland KitchenRx for Eliquis was sent to [facility] so the facility can administer this to the pt.

## 2022-05-02 ENCOUNTER — Other Ambulatory Visit: Payer: Self-pay | Admitting: Student

## 2022-05-02 DIAGNOSIS — I4892 Unspecified atrial flutter: Secondary | ICD-10-CM

## 2022-05-02 NOTE — Telephone Encounter (Signed)
Eliquis 2.5mg  refill request received. Patient is 81 years old, weight-87.1kg, Crea-5.50 on 10/27/2021, Diagnosis-Afib/flutter, and last seen by Oda Kilts on 04/09/2022. Dose is appropriate based on dosing criteria. Will send in refill to requested pharmacy.

## 2022-05-21 ENCOUNTER — Ambulatory Visit: Payer: Medicare Other | Admitting: Student

## 2022-05-23 ENCOUNTER — Ambulatory Visit: Payer: Medicare Other | Admitting: Student

## 2022-06-07 NOTE — Progress Notes (Signed)
PCP:  Verl Blalock, NP Primary Cardiologist: Thompson Grayer, MD Electrophysiologist: Thompson Grayer, MD -> Dr. Quentin Ore [facility])  Frank [name] is a 81 y.o. male seen today for Thompson Grayer, MD for routine electrophysiology followup.   At last visit started on [facility] with new diagnosis of AFL.   Since last being seen in our clinic the patient reports doing well from a cardiac perspective. He had brief, singular palpitations but nothing sustained. Denies exertional CP or undue SOB. No syncope or edema. "Very skinny ankles" with HD. Takes extra midodrine on HD days.   Past Medical History:  Diagnosis Date   Anemia of chronic kidney failure    Arthritis    Benign prostate hyperplasia    Bradycardia    Cataracts, bilateral 05/15/2012   Chronic idiopathic gout of multiple sites 02/10/2015   Chronic kidney disease    [facility]- ESRD   Depression    Diabetes mellitus without complication ([place])    diet controlled    Diabetic nephropathy ([place])    ED (erectile dysfunction) 08/28/2012   Elevated liver enzymes 06/10/2019   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 HTN PLAN   BP goal is <140/90 BP Readings from Last 3 Encounters:  08/27/16 140/79  08/13/16 141/80  08/06/16 144/86   Current Anti-Hyptensive Medications: None  Today's Recommendations: Continue lifestyle modifications. Defer starting medication to PCP.  Recommend continued regular exercise as tolerated. Recomm   History of UTI    s/p foley in hospital 01/2019    Hyperlipidemia    Hypothyroidism    Illiterate 11/19/2013   Last Assessment & Plan:  Assisted patient with Handicapped Placard application today.     Insomnia 05/08/2019   Neuropathy    Peripheral neuropathy    Peripheral vascular disease ([place])    Second degree heart block    Secondary  hyperparathyroidism ([place])    Renal   Stroke ([place]) 2021   Thyroid disease    Past Surgical History:  Procedure Laterality Date   AV FISTULA PLACEMENT Left 11/19/2017   Procedure: ARTERIOVENOUS (AV) FISTULA CREATION;  Surgeon: [name], MD;  Location: [place];  Service: Vascular;  Laterality: Left;   AV FISTULA PLACEMENT Right 10/27/2021   Procedure: CREATION OF BRACHIOCEPHALIC RIGHT ARM ARTERIOVENOUS (AV) FISTULA;  Surgeon: [name], MD;  Location: [place];  Service: Vascular;  Laterality: Right;   [place] Left 06/04/2018   Procedure: BASILIC VEIN TRANSPOSITION ARM;  Surgeon: [name], MD;  Location: [place];  Service: Vascular;  Laterality: Left;   COLONOSCOPY     DIALYSIS/PERMA CATHETER REMOVAL N/A 03/23/2019   Procedure: DIALYSIS/PERMA CATHETER REMOVAL;  Surgeon: [name], MD;  Location: [place] [facility];  Service: Cardiovascular;  Laterality: N/A;   EYE SURGERY     cataract surgery b/l; photophobia since    INSERTION OF DIALYSIS CATHETER N/A 01/23/2019   Procedure: INSERTION OF DIALYSIS CATHETER;  Surgeon: [name], MD;  Location: [facility] OR;  Service: Vascular;  Laterality: N/A;  INSERTION OF DIALYSIS CATHETER   INSERTION OF DIALYSIS CATHETER  2022   right subclavian (chest area)   IR ANGIO INTRA EXTRACRAN SEL COM CAROTID INNOMINATE UNI L MOD SED  10/15/2019   IR ANGIO VERTEBRAL SEL SUBCLAVIAN INNOMINATE UNI R MOD SED  10/15/2019  IR CT HEAD LTD  10/15/2019   IR FLUORO GUIDE CV LINE RIGHT  10/08/2019   IR INTRAVSC STENT CERV CAROTID W/EMB-PROT MOD SED INCL ANGIO  10/15/2019   IR THROMBECTOMY AV FISTULA W/THROMBOLYSIS/PTA INC/SHUNT/IMG LEFT Left 10/09/2019   IR US GUIDE VASC ACCESS LEFT  10/09/2019   IR US GUIDE VASC ACCESS RIGHT  10/08/2019   KNEE ARTHROSCOPY Left    Knee   PERIPHERAL VASCULAR THROMBECTOMY Left 07/06/2019   Procedure: PERIPHERAL VASCULAR THROMBECTOMY;  Surgeon: [name], MD;   Location: [place] [facility];  Service: Cardiovascular;  Laterality: Left;   RADIOLOGY WITH ANESTHESIA N/A 10/15/2019   Procedure: stent placement;  Surgeon: Luanne Bras, MD;  Location: [place];  Service: Radiology;  Laterality: N/A;   THROMBECTOMY W/ EMBOLECTOMY Left 01/27/2019   Procedure: THROMBECTOMY Left arm ARTERIOVENOUS FISTULA  and Left arm Arteriovenous gortex graft;  Surgeon: [name], MD;  Location[facility] OR;  Service: Vascular;  Laterality: Left;   VENOGRAM Left 01/27/2019   Procedure: Left arm FISTULOGRAM/;  Surgeon: [name], MD;  Location[facility];  Service: Vascular;  Laterality: Left;    Current Outpatient Medications  Medication Sig Dispense Refill   acetaminophen (TYLENOL) 325 MG tablet Take 650 mg by mouth every 6 (six) hours as needed for moderate pain.     albuterol (VENTOLIN HFA) 108 (90 Base) MCG/ACT inhaler Inhale 2 puffs into the lungs every 6 (six) hours as needed for shortness of breath.     allopurinol (ZYLOPRIM) 100 MG tablet Take 1 tablet (100 mg total) by mouth daily. 90 tablet 3   ALPRAZolam (XANAX) 0.25 MG tablet Take 0.25 mg by mouth daily with supper.     ALPRAZolam (XANAX) 0.5 MG tablet Take 0.25 mg by mouth Every Tuesday,Thursday,and Saturday with dialysis.     apixaban (ELIQUIS) 2.5 MG TABS tablet TAKE ONE TABLET BY MOUTH TWICE A DAY. **THIS MEDICINE THINS THE BLOOD** 60 tablet 5   aspirin 81 MG chewable tablet Chew 81 mg by mouth daily.     atorvastatin (LIPITOR) 20 MG tablet Take 20 mg by mouth daily.     benzonatate (TESSALON) 200 MG capsule Take 200 mg by mouth 3 (three) times daily as needed for cough.     bisacodyl (DULCOLAX) 10 MG suppository Place 10 mg rectally as needed for moderate constipation.     Camphor-Menthol (FREEZE IT EX) Apply 1 application topically every 6 (six) hours as needed (back and neck pain).     Cholecalciferol (VITAMIN D) 50 MCG (2000 UT) tablet Take 2,000 Units by mouth daily.     docusate sodium  (COLACE) 100 MG capsule Take 200 mg by mouth daily as needed for moderate constipation.     donepezil (ARICEPT) 5 MG tablet Take 1 tablet (5 mg total) by mouth at bedtime. 30 tablet 6   guaiFENesin (MUCINEX) 600 MG 12 hr tablet Take 600 mg by mouth every 12 (twelve) hours as needed (congestion).     levothyroxine (SYNTHROID) 150 MCG tablet Take 150 mcg by mouth daily.     lidocaine-prilocaine (EMLA) cream Apply 1 application topically See admin instructions. Apply to access site 1 to 2 hours before dialysis. Cover with occlusive dressing.On Tuesday,Thursday and Saturday     Melatonin 10 MG TABS Take 10 mg by mouth at bedtime.     midodrine (PROAMATINE) 10 MG tablet Take 1 tablet (10 mg total) by mouth 3 (three) times daily with meals. (Patient taking differently: Take 10 mg by mouth  3 (three) times daily with meals. Use as directed) 270 tablet 3   midodrine (PROAMATINE) 5 MG tablet 5 mg. Take 5 mg by mouth at 10:30 and 1 pm on Dialysis days     ondansetron (ZOFRAN) 4 MG tablet Take 1 tablet (4 mg total) by mouth every 8 (eight) hours as needed. 20 tablet 0   pantoprazole (PROTONIX) 40 MG tablet Take 1 tablet (40 mg total) by mouth daily. (Patient taking differently: Take 40 mg by mouth daily before breakfast.) 30 tablet 1   senna (SENOKOT) 8.6 MG TABS tablet Take 2 tablets by mouth at bedtime as needed for moderate constipation.     sevelamer carbonate (RENVELA) 800 MG tablet Take 3 tablets (2,400 mg total) by mouth 3 (three) times daily with meals. (Patient taking differently: Take 2,400 mg by mouth 3 (three) times daily with meals. Use as directed) 120 tablet 0   Skin Protectants, Misc. (MINERIN CREME EX) Apply 1 application topically daily. Apply to both arms and legs     tamsulosin (FLOMAX) 0.4 MG CAPS capsule Take 1 capsule (0.4 mg total) by mouth daily. (Patient taking differently: Take 0.8 mg by mouth daily.) 30 capsule 1   tiZANidine (ZANAFLEX) 2 MG tablet Take 2 mg by mouth at bedtime.      traZODone (DESYREL) 50 MG tablet Take 50 mg by mouth at bedtime.     umeclidinium-vilanterol (ANORO ELLIPTA) 62.5-25 MCG/ACT AEPB Inhale 1 puff into the lungs daily.     zinc oxide (BALMEX) 11.3 % CREA cream Apply 1 application topically See admin instructions. Apply to bottom and groin area 3 times daily and as needed for incontinence care     No current facility-administered medications for this visit.    Allergies  Allergen Reactions   Ace Inhibitors Other (See Comments)    Hyperkalemia on ACE INHIBITORS Cr>1.9   Angiotensin Receptor Blockers Other (See Comments)    Hyperkalemia Elevates Cr > 1.9    Ivp Dye [Iodinated Contrast Media] Other (See Comments)    Cannot have due to renal failure   Adhesive [Tape] Other (See Comments)    UNDESCRIBED REACTION Can tolerate ONLY Coban wrap or mild paper tape!!    Social History   Socioeconomic History   Marital status: Divorced    Spouse name: Not on file   Number of children: 4   Years of education: Not on file   Highest education level: 8th grade  Occupational History   Not on file  Tobacco Use   Smoking status: Former    Years: 30.00    Types: Cigarettes    Quit date: 1994    Years since quitting: 29.7   Smokeless tobacco: Never  Vaping Use   Vaping Use: Never used  Substance and Sexual Activity   Alcohol use: No   Drug use: No   Sexual activity: Not Currently  Other Topics Concern   Not on file  Social History Narrative   4 kids (3 daughters and 1 son)   Daughter [name] DPR      Lives at [facility] Determinants of Health   Financial Resource Strain: Low Risk  (07/06/2019)   Overall Financial Resource Strain (CARDIA)    Difficulty of Paying Living Expenses: Not hard at all  Food Insecurity: No Food Insecurity (07/06/2019)   Hunger Vital Sign    Worried About Running Out of Food in the Last Year: Never true    Ran Out of Food in  the Last Year: Never true  Transportation Needs: No  Transportation Needs (07/06/2019)   PRAPARE - Hydrologist (Medical): No    Lack of Transportation (Non-Medical): No  Physical Activity: Not on file  Stress: No Stress Concern Present (07/06/2019)   [place]    Feeling of Stress : Only a little  Social Connections: Unknown (07/06/2019)   Social Connection and Isolation Panel [NHANES]    Frequency of Communication with Friends and Family: More than three times a week    Frequency of Social Gatherings with Friends and Family: Not on file    Attends Religious Services: Not on file    Active Member of Clubs or Organizations: Not on file    Attends Archivist Meetings: Not on file    Marital Status: Divorced  Intimate Partner Violence: Not At Risk (07/06/2019)   Humiliation, Afraid, Rape, and Kick questionnaire    Fear of Current or Ex-Partner: No    Emotionally Abused: No    Physically Abused: No    Sexually Abused: No     Review of Systems: All other systems reviewed and are otherwise negative except as noted above.  Physical Exam: Vitals:   06/08/22 1038  BP: (!) 83/44  Pulse: 72  Weight: 189 lb (85.7 kg)  Height: 5\' 8"  (1.727 m)    GEN- The patient is well appearing, alert and oriented x 3 today.   HEENT: normocephalic, atraumatic; sclera clear, conjunctiva pink; hearing intact; oropharynx clear; neck supple, no JVP Lymph- no cervical lymphadenopathy Lungs- Clear to ausculation bilaterally, normal work of breathing.  No wheezes, rales, rhonchi Heart- Irregularly irregular rate and rhythm, no murmurs, rubs or gallops, PMI not laterally displaced GI- soft, non-tender, non-distended, bowel sounds present, no hepatosplenomegaly Extremities- No peripheral edema. no clubbing or cyanosis; DP/PT/radial pulses 2+ bilaterally MS- no significant deformity or atrophy Skin- warm and dry, no rash or lesion Psych- euthymic mood,  full affect Neuro- strength and sensation are intact  EKG is ordered. Personal review of EKG from today shows AF at 72 bpm  Additional studies reviewed include: Previous EP office notes.   Assessment and Plan:  1.  Conduction system disease 2. Atrial flutter, new diagnosis [id number]  > 7 EKG today shows AF at 72 bpm  Continue eliquis 2.5 (ESRD and > 80) and ASA No symptomatic bradycardia No current indication for pacemaker implant. He would be a poor candidate for invasive procedures with increased infection risk With baseline conduction disease, poor candidate for AADs including amiodarone, which would be his only options given ESRD.  Currently, he is asymptomatic and in shared decision making we have opted for a rate control strategy. They also wish to avoid cardioversion at this time. It is felt he has been in AF since 07/2021 with no real symptoms, based on echo report at outside facility per daughter.    3.  Hypotension during HD Continue midodrine   4.  Urinary retention Followed by urology    5.  ESRD on HD Echo from 07/2021 showed normal LVEF Volume per HD.   Follow up with Dr. Quentin Ore in 6 months in [place] to establish from Dr. Thomasene Mohair, PA-C  06/08/22 10:52 AM

## 2022-06-08 ENCOUNTER — Ambulatory Visit: Payer: Medicare Other | Attending: Student | Admitting: Student

## 2022-06-08 ENCOUNTER — Encounter: Payer: Self-pay | Admitting: Student

## 2022-06-08 VITALS — BP 83/44 | HR 72 | Ht 68.0 in | Wt 189.0 lb

## 2022-06-08 DIAGNOSIS — I6521 Occlusion and stenosis of right carotid artery: Secondary | ICD-10-CM | POA: Diagnosis not present

## 2022-06-08 DIAGNOSIS — N186 End stage renal disease: Secondary | ICD-10-CM | POA: Insufficient documentation

## 2022-06-08 DIAGNOSIS — I4892 Unspecified atrial flutter: Secondary | ICD-10-CM | POA: Diagnosis present

## 2022-06-08 DIAGNOSIS — I959 Hypotension, unspecified: Secondary | ICD-10-CM | POA: Diagnosis present

## 2022-06-08 NOTE — Patient Instructions (Signed)
Medication Instructions:  Your physician recommends that you continue on your current medications as directed. Please refer to the Current Medication list given to you today.  *If you need a refill on your cardiac medications before your next appointment, please call your pharmacy*   Lab Work: None If you have labs (blood work) drawn today and your tests are completely normal, you will receive your results only by: MyChart Message (if you have MyChart) OR A paper copy in the mail If you have any lab test that is abnormal or we need to change your treatment, we will call you to review the results.   Follow-Up: At [facility], you and your health needs are our priority.  As part of our continuing mission to provide you with exceptional heart care, we have created designated Provider Care Teams.  These Care Teams include your primary Cardiologist (physician) and Advanced Practice Providers (APPs -  Physician Assistants and Nurse Practitioners) who all work together to provide you with the care you need, when you need it.   Your next appointment:   6 month(s)  The format for your next appointment:   In Person  Provider:   Cameron [place], MD   

## 2022-06-21 DIAGNOSIS — D631 Anemia in chronic kidney disease: Secondary | ICD-10-CM | POA: Diagnosis present

## 2022-06-21 DIAGNOSIS — Z992 Dependence on renal dialysis: Secondary | ICD-10-CM

## 2022-06-21 DIAGNOSIS — I69320 Aphasia following cerebral infarction: Secondary | ICD-10-CM

## 2022-06-21 DIAGNOSIS — I9589 Other hypotension: Secondary | ICD-10-CM | POA: Diagnosis present

## 2022-06-21 DIAGNOSIS — A4181 Sepsis due to Enterococcus: Principal | ICD-10-CM | POA: Diagnosis present

## 2022-06-21 DIAGNOSIS — Z87440 Personal history of urinary (tract) infections: Secondary | ICD-10-CM

## 2022-06-21 DIAGNOSIS — R6521 Severe sepsis with septic shock: Secondary | ICD-10-CM | POA: Diagnosis present

## 2022-06-21 DIAGNOSIS — E039 Hypothyroidism, unspecified: Secondary | ICD-10-CM | POA: Diagnosis present

## 2022-06-21 DIAGNOSIS — E1151 Type 2 diabetes mellitus with diabetic peripheral angiopathy without gangrene: Secondary | ICD-10-CM | POA: Diagnosis present

## 2022-06-21 DIAGNOSIS — Z841 Family history of disorders of kidney and ureter: Secondary | ICD-10-CM

## 2022-06-21 DIAGNOSIS — I2510 Atherosclerotic heart disease of native coronary artery without angina pectoris: Secondary | ICD-10-CM | POA: Diagnosis present

## 2022-06-21 DIAGNOSIS — N186 End stage renal disease: Secondary | ICD-10-CM | POA: Diagnosis present

## 2022-06-21 DIAGNOSIS — Z91041 Radiographic dye allergy status: Secondary | ICD-10-CM

## 2022-06-21 DIAGNOSIS — D72819 Decreased white blood cell count, unspecified: Secondary | ICD-10-CM | POA: Diagnosis present

## 2022-06-21 DIAGNOSIS — L89151 Pressure ulcer of sacral region, stage 1: Secondary | ICD-10-CM | POA: Diagnosis present

## 2022-06-21 DIAGNOSIS — E871 Hypo-osmolality and hyponatremia: Secondary | ICD-10-CM | POA: Diagnosis not present

## 2022-06-21 DIAGNOSIS — Z87891 Personal history of nicotine dependence: Secondary | ICD-10-CM

## 2022-06-21 DIAGNOSIS — A4159 Other Gram-negative sepsis: Secondary | ICD-10-CM | POA: Diagnosis present

## 2022-06-21 DIAGNOSIS — K8000 Calculus of gallbladder with acute cholecystitis without obstruction: Secondary | ICD-10-CM | POA: Diagnosis present

## 2022-06-21 DIAGNOSIS — Z66 Do not resuscitate: Secondary | ICD-10-CM | POA: Diagnosis not present

## 2022-06-21 DIAGNOSIS — J9811 Atelectasis: Secondary | ICD-10-CM | POA: Diagnosis present

## 2022-06-21 DIAGNOSIS — Z833 Family history of diabetes mellitus: Secondary | ICD-10-CM

## 2022-06-21 DIAGNOSIS — Z1611 Resistance to penicillins: Secondary | ICD-10-CM | POA: Diagnosis present

## 2022-06-21 DIAGNOSIS — B961 Klebsiella pneumoniae [K. pneumoniae] as the cause of diseases classified elsewhere: Secondary | ICD-10-CM | POA: Diagnosis present

## 2022-06-21 DIAGNOSIS — N2581 Secondary hyperparathyroidism of renal origin: Secondary | ICD-10-CM | POA: Diagnosis present

## 2022-06-21 DIAGNOSIS — Z1152 Encounter for screening for COVID-19: Secondary | ICD-10-CM

## 2022-06-21 DIAGNOSIS — Z888 Allergy status to other drugs, medicaments and biological substances status: Secondary | ICD-10-CM

## 2022-06-21 DIAGNOSIS — I4510 Unspecified right bundle-branch block: Secondary | ICD-10-CM | POA: Diagnosis present

## 2022-06-21 DIAGNOSIS — E1122 Type 2 diabetes mellitus with diabetic chronic kidney disease: Secondary | ICD-10-CM | POA: Diagnosis present

## 2022-06-21 DIAGNOSIS — N400 Enlarged prostate without lower urinary tract symptoms: Secondary | ICD-10-CM | POA: Diagnosis present

## 2022-06-21 DIAGNOSIS — I120 Hypertensive chronic kidney disease with stage 5 chronic kidney disease or end stage renal disease: Secondary | ICD-10-CM | POA: Diagnosis present

## 2022-06-21 DIAGNOSIS — M899 Disorder of bone, unspecified: Secondary | ICD-10-CM | POA: Diagnosis present

## 2022-06-21 DIAGNOSIS — E876 Hypokalemia: Secondary | ICD-10-CM | POA: Diagnosis present

## 2022-06-21 DIAGNOSIS — M1A09X0 Idiopathic chronic gout, multiple sites, without tophus (tophi): Secondary | ICD-10-CM | POA: Diagnosis present

## 2022-06-21 DIAGNOSIS — I441 Atrioventricular block, second degree: Secondary | ICD-10-CM | POA: Diagnosis present

## 2022-06-21 DIAGNOSIS — K810 Acute cholecystitis: Secondary | ICD-10-CM | POA: Diagnosis not present

## 2022-06-21 DIAGNOSIS — I469 Cardiac arrest, cause unspecified: Secondary | ICD-10-CM | POA: Diagnosis not present

## 2022-06-21 DIAGNOSIS — M1990 Unspecified osteoarthritis, unspecified site: Secondary | ICD-10-CM | POA: Diagnosis present

## 2022-06-21 DIAGNOSIS — I2489 Other forms of acute ischemic heart disease: Secondary | ICD-10-CM | POA: Diagnosis present

## 2022-06-21 DIAGNOSIS — F419 Anxiety disorder, unspecified: Secondary | ICD-10-CM | POA: Diagnosis present

## 2022-06-21 DIAGNOSIS — Z7901 Long term (current) use of anticoagulants: Secondary | ICD-10-CM

## 2022-06-21 DIAGNOSIS — Z20822 Contact with and (suspected) exposure to covid-19: Secondary | ICD-10-CM | POA: Diagnosis present

## 2022-06-21 DIAGNOSIS — G4700 Insomnia, unspecified: Secondary | ICD-10-CM | POA: Diagnosis present

## 2022-06-21 DIAGNOSIS — E785 Hyperlipidemia, unspecified: Secondary | ICD-10-CM | POA: Diagnosis present

## 2022-06-21 DIAGNOSIS — R0902 Hypoxemia: Secondary | ICD-10-CM | POA: Diagnosis not present

## 2022-06-21 DIAGNOSIS — I482 Chronic atrial fibrillation, unspecified: Secondary | ICD-10-CM | POA: Diagnosis present

## 2022-06-21 DIAGNOSIS — E1142 Type 2 diabetes mellitus with diabetic polyneuropathy: Secondary | ICD-10-CM | POA: Diagnosis present

## 2022-06-21 NOTE — ED Triage Notes (Signed)
First nurse note- Pt to triage via [facility] from [place] with c/o abd pain x 1 day.  Pt had Zofran and Tylenol at facility with improvement.  Last BM yesterday morning. Dialysis pt, with last treatment this morning.  Pt A&O, ambulatory per EMS

## 2022-06-22 ENCOUNTER — Emergency Department: Payer: Medicare Other

## 2022-06-22 ENCOUNTER — Inpatient Hospital Stay: Payer: Medicare Other | Admitting: Radiology

## 2022-06-22 ENCOUNTER — Inpatient Hospital Stay
Admission: EM | Admit: 2022-06-22 | Discharge: 2022-07-11 | DRG: 871 | Disposition: E | Payer: Medicare Other | Source: Skilled Nursing Facility | Attending: Internal Medicine | Admitting: Internal Medicine

## 2022-06-22 DIAGNOSIS — I469 Cardiac arrest, cause unspecified: Secondary | ICD-10-CM | POA: Diagnosis not present

## 2022-06-22 DIAGNOSIS — A419 Sepsis, unspecified organism: Secondary | ICD-10-CM | POA: Diagnosis present

## 2022-06-22 DIAGNOSIS — I482 Chronic atrial fibrillation, unspecified: Secondary | ICD-10-CM | POA: Diagnosis present

## 2022-06-22 DIAGNOSIS — E1122 Type 2 diabetes mellitus with diabetic chronic kidney disease: Secondary | ICD-10-CM | POA: Diagnosis present

## 2022-06-22 DIAGNOSIS — A4159 Other Gram-negative sepsis: Secondary | ICD-10-CM | POA: Diagnosis present

## 2022-06-22 DIAGNOSIS — D72819 Decreased white blood cell count, unspecified: Secondary | ICD-10-CM | POA: Diagnosis present

## 2022-06-22 DIAGNOSIS — N186 End stage renal disease: Secondary | ICD-10-CM | POA: Diagnosis present

## 2022-06-22 DIAGNOSIS — I2489 Other forms of acute ischemic heart disease: Secondary | ICD-10-CM | POA: Diagnosis present

## 2022-06-22 DIAGNOSIS — E1151 Type 2 diabetes mellitus with diabetic peripheral angiopathy without gangrene: Secondary | ICD-10-CM | POA: Diagnosis present

## 2022-06-22 DIAGNOSIS — Z7189 Other specified counseling: Secondary | ICD-10-CM | POA: Diagnosis not present

## 2022-06-22 DIAGNOSIS — I2510 Atherosclerotic heart disease of native coronary artery without angina pectoris: Secondary | ICD-10-CM | POA: Diagnosis present

## 2022-06-22 DIAGNOSIS — D631 Anemia in chronic kidney disease: Secondary | ICD-10-CM | POA: Diagnosis present

## 2022-06-22 DIAGNOSIS — E1142 Type 2 diabetes mellitus with diabetic polyneuropathy: Secondary | ICD-10-CM | POA: Diagnosis present

## 2022-06-22 DIAGNOSIS — K819 Cholecystitis, unspecified: Secondary | ICD-10-CM | POA: Diagnosis not present

## 2022-06-22 DIAGNOSIS — L8990 Pressure ulcer of unspecified site, unspecified stage: Secondary | ICD-10-CM | POA: Insufficient documentation

## 2022-06-22 DIAGNOSIS — Z20822 Contact with and (suspected) exposure to covid-19: Secondary | ICD-10-CM | POA: Diagnosis present

## 2022-06-22 DIAGNOSIS — I120 Hypertensive chronic kidney disease with stage 5 chronic kidney disease or end stage renal disease: Secondary | ICD-10-CM | POA: Diagnosis present

## 2022-06-22 DIAGNOSIS — E039 Hypothyroidism, unspecified: Secondary | ICD-10-CM | POA: Diagnosis present

## 2022-06-22 DIAGNOSIS — R6520 Severe sepsis without septic shock: Secondary | ICD-10-CM | POA: Diagnosis not present

## 2022-06-22 DIAGNOSIS — Z66 Do not resuscitate: Secondary | ICD-10-CM | POA: Diagnosis not present

## 2022-06-22 DIAGNOSIS — Z1611 Resistance to penicillins: Secondary | ICD-10-CM | POA: Diagnosis present

## 2022-06-22 DIAGNOSIS — A4181 Sepsis due to Enterococcus: Secondary | ICD-10-CM | POA: Diagnosis present

## 2022-06-22 DIAGNOSIS — K8000 Calculus of gallbladder with acute cholecystitis without obstruction: Secondary | ICD-10-CM | POA: Diagnosis present

## 2022-06-22 DIAGNOSIS — R7989 Other specified abnormal findings of blood chemistry: Secondary | ICD-10-CM | POA: Diagnosis present

## 2022-06-22 DIAGNOSIS — L89151 Pressure ulcer of sacral region, stage 1: Secondary | ICD-10-CM | POA: Diagnosis present

## 2022-06-22 DIAGNOSIS — N2581 Secondary hyperparathyroidism of renal origin: Secondary | ICD-10-CM | POA: Diagnosis present

## 2022-06-22 DIAGNOSIS — Z992 Dependence on renal dialysis: Secondary | ICD-10-CM | POA: Diagnosis not present

## 2022-06-22 DIAGNOSIS — R6521 Severe sepsis with septic shock: Secondary | ICD-10-CM | POA: Diagnosis present

## 2022-06-22 DIAGNOSIS — J9811 Atelectasis: Secondary | ICD-10-CM | POA: Diagnosis present

## 2022-06-22 DIAGNOSIS — K810 Acute cholecystitis: Secondary | ICD-10-CM

## 2022-06-22 DIAGNOSIS — E871 Hypo-osmolality and hyponatremia: Secondary | ICD-10-CM | POA: Diagnosis not present

## 2022-06-22 DIAGNOSIS — I69320 Aphasia following cerebral infarction: Secondary | ICD-10-CM | POA: Diagnosis not present

## 2022-06-22 HISTORY — PX: IR PERC CHOLECYSTOSTOMY: IMG2326

## 2022-06-22 LAB — COMPREHENSIVE METABOLIC PANEL
ALT: 9 U/L (ref 0–44)
AST: 23 U/L (ref 15–41)
Albumin: 4 g/dL (ref 3.5–5.0)
Alkaline Phosphatase: 78 U/L (ref 38–126)
Anion gap: 19 — ABNORMAL HIGH (ref 5–15)
BUN: 22 mg/dL (ref 8–23)
CO2: 25 mmol/L (ref 22–32)
Calcium: 8.7 mg/dL — ABNORMAL LOW (ref 8.9–10.3)
Chloride: 94 mmol/L — ABNORMAL LOW (ref 98–111)
Creatinine, Ser: 4.05 mg/dL — ABNORMAL HIGH (ref 0.61–1.24)
GFR, Estimated: 14 mL/min — ABNORMAL LOW (ref >60)
Glucose, Bld: 163 mg/dL — ABNORMAL HIGH (ref 70–99)
Potassium: 3.2 mmol/L — ABNORMAL LOW (ref 3.5–5.1)
Sodium: 138 mmol/L (ref 135–145)
Total Bilirubin: 1.2 mg/dL (ref 0.3–1.2)
Total Protein: 7.6 g/dL (ref 6.5–8.1)

## 2022-06-22 LAB — GLUCOSE, CAPILLARY
Glucose-Capillary: 90 mg/dL (ref 70–99)
Glucose-Capillary: 80 mg/dL (ref 70–99)
Glucose-Capillary: 107 mg/dL — ABNORMAL HIGH (ref 70–99)
Glucose-Capillary: 114 mg/dL — ABNORMAL HIGH (ref 70–99)

## 2022-06-22 LAB — CBG MONITORING, ED: Glucose-Capillary: 84 mg/dL (ref 70–99)

## 2022-06-22 LAB — CBC
HCT: 43.5 % (ref 39.0–52.0)
Hemoglobin: 13 g/dL (ref 13.0–17.0)
MCH: 30.2 pg (ref 26.0–34.0)
MCHC: 29.9 g/dL — ABNORMAL LOW (ref 30.0–36.0)
MCV: 100.9 fL — ABNORMAL HIGH (ref 80.0–100.0)
Platelets: 153 10*3/uL (ref 150–400)
RBC: 4.31 MIL/uL (ref 4.22–5.81)
RDW: 13.2 % (ref 11.5–15.5)
WBC: 3.1 10*3/uL — ABNORMAL LOW (ref 4.0–10.5)
nRBC: 0 % (ref 0.0–0.2)

## 2022-06-22 LAB — TYPE AND SCREEN
ABO/RH(D): B POS
Antibody Screen: NEGATIVE

## 2022-06-22 LAB — BASIC METABOLIC PANEL
Anion gap: 11 (ref 5–15)
BUN: 33 mg/dL — ABNORMAL HIGH (ref 8–23)
CO2: 28 mmol/L (ref 22–32)
Calcium: 8.2 mg/dL — ABNORMAL LOW (ref 8.9–10.3)
Chloride: 97 mmol/L — ABNORMAL LOW (ref 98–111)
Creatinine, Ser: 5.11 mg/dL — ABNORMAL HIGH (ref 0.61–1.24)
GFR, Estimated: 11 mL/min — ABNORMAL LOW (ref >60)
Glucose, Bld: 121 mg/dL — ABNORMAL HIGH (ref 70–99)
Potassium: 4 mmol/L (ref 3.5–5.1)
Sodium: 136 mmol/L (ref 135–145)

## 2022-06-22 LAB — SARS CORONAVIRUS 2 BY RT PCR: SARS Coronavirus 2 by RT PCR: NEGATIVE

## 2022-06-22 LAB — TROPONIN I (HIGH SENSITIVITY)
Troponin I (High Sensitivity): 49 ng/L — ABNORMAL HIGH (ref <18)
Troponin I (High Sensitivity): 51 ng/L — ABNORMAL HIGH (ref <18)
Troponin I (High Sensitivity): 80 ng/L — ABNORMAL HIGH (ref <18)

## 2022-06-22 LAB — PROTIME-INR
INR: 1.4 — ABNORMAL HIGH (ref 0.8–1.2)
Prothrombin Time: 17.3 seconds — ABNORMAL HIGH (ref 11.4–15.2)

## 2022-06-22 LAB — MRSA NEXT GEN BY PCR, NASAL: MRSA by PCR Next Gen: NOT DETECTED

## 2022-06-22 LAB — HEMOGLOBIN A1C
Hgb A1c MFr Bld: 5.2 % (ref 4.8–5.6)
Mean Plasma Glucose: 102.54 mg/dL

## 2022-06-22 LAB — LIPASE, BLOOD: Lipase: 38 U/L (ref 11–51)

## 2022-06-22 LAB — LACTIC ACID, PLASMA
Lactic Acid, Venous: 2.5 mmol/L (ref 0.5–1.9)
Lactic Acid, Venous: 1.3 mmol/L (ref 0.5–1.9)

## 2022-06-22 LAB — APTT: aPTT: 38 seconds — ABNORMAL HIGH (ref 24–36)

## 2022-06-22 LAB — MAGNESIUM: Magnesium: 1.8 mg/dL (ref 1.7–2.4)

## 2022-06-22 LAB — PROCALCITONIN: Procalcitonin: 6.51 ng/mL

## 2022-06-22 MED ORDER — SODIUM CHLORIDE 0.9% FLUSH
5.0000 mL | Freq: Three times a day (TID) | INTRAVENOUS | Status: DC
  Administered 2022-06-23 – 2022-06-27 (×12): 5 mL

## 2022-06-22 MED ORDER — PIPERACILLIN-TAZOBACTAM 3.375 G IVPB 30 MIN
3.3750 g | Freq: Once | INTRAVENOUS | Status: AC
  Administered 2022-06-22: 3.375 g via INTRAVENOUS
  Filled 2022-06-22: qty 50

## 2022-06-22 MED ORDER — ALLOPURINOL 100 MG PO TABS
100.0000 mg | ORAL_TABLET | Freq: Every day | ORAL | Status: DC
  Administered 2022-06-23 – 2022-06-27 (×5): 100 mg via ORAL
  Filled 2022-06-22 (×6): qty 1

## 2022-06-22 MED ORDER — LACTATED RINGERS IV BOLUS
1000.0000 mL | Freq: Once | INTRAVENOUS | Status: AC
  Administered 2022-06-22: 1000 mL via INTRAVENOUS

## 2022-06-22 MED ORDER — ALPRAZOLAM 0.25 MG PO TABS
0.2500 mg | ORAL_TABLET | Freq: Every day | ORAL | Status: DC
  Administered 2022-06-22 – 2022-06-27 (×6): 0.25 mg via ORAL
  Filled 2022-06-22 (×6): qty 1

## 2022-06-22 MED ORDER — ZINC OXIDE 11.3 % EX CREA
1.0000 | TOPICAL_CREAM | CUTANEOUS | Status: DC | PRN
  Administered 2022-06-22: 1 via TOPICAL
  Filled 2022-06-22: qty 56

## 2022-06-22 MED ORDER — SODIUM CHLORIDE 0.9 % IV SOLN: 250.0000 mL | INTRAVENOUS | Status: DC

## 2022-06-22 MED ORDER — IOHEXOL 300 MG/ML  SOLN
2.0000 mL | Freq: Once | INTRAMUSCULAR | Status: AC | PRN
  Administered 2022-06-22: 2 mL

## 2022-06-22 MED ORDER — ACETAMINOPHEN 10 MG/ML IV SOLN
1000.0000 mg | Freq: Once | INTRAVENOUS | Status: AC
  Administered 2022-06-22: 1000 mg via INTRAVENOUS
  Filled 2022-06-22: qty 100

## 2022-06-22 MED ORDER — ATORVASTATIN CALCIUM 20 MG PO TABS
20.0000 mg | ORAL_TABLET | Freq: Every day | ORAL | Status: DC
  Administered 2022-06-23 – 2022-06-27 (×5): 20 mg via ORAL
  Filled 2022-06-22 (×5): qty 1

## 2022-06-22 MED ORDER — MIDODRINE HCL 5 MG PO TABS
5.0000 mg | ORAL_TABLET | ORAL | Status: DC
  Administered 2022-06-23 – 2022-06-26 (×3): 5 mg via ORAL
  Filled 2022-06-22 (×2): qty 1

## 2022-06-22 MED ORDER — ACETAMINOPHEN 325 MG PO TABS: 650.0000 mg | ORAL_TABLET | Freq: Four times a day (QID) | ORAL | Status: DC | PRN

## 2022-06-22 MED ORDER — DONEPEZIL HCL 5 MG PO TABS
5.0000 mg | ORAL_TABLET | Freq: Every day | ORAL | Status: DC
  Administered 2022-06-22 – 2022-06-27 (×6): 5 mg via ORAL
  Filled 2022-06-22 (×7): qty 1

## 2022-06-22 MED ORDER — MELATONIN 5 MG PO TABS
10.0000 mg | ORAL_TABLET | Freq: Every day | ORAL | Status: DC
  Administered 2022-06-22 – 2022-06-27 (×6): 10 mg via ORAL
  Filled 2022-06-22 (×6): qty 2

## 2022-06-22 MED ORDER — FENTANYL CITRATE (PF) 100 MCG/2ML IJ SOLN
INTRAMUSCULAR | Status: AC | PRN
  Administered 2022-06-22: 25 ug via INTRAVENOUS

## 2022-06-22 MED ORDER — MIDODRINE HCL 5 MG PO TABS
10.0000 mg | ORAL_TABLET | Freq: Three times a day (TID) | ORAL | Status: DC
  Administered 2022-06-22 – 2022-06-27 (×15): 10 mg via ORAL
  Filled 2022-06-22 (×15): qty 2

## 2022-06-22 MED ORDER — TIZANIDINE HCL 2 MG PO TABS
2.0000 mg | ORAL_TABLET | Freq: Every day | ORAL | Status: DC
  Administered 2022-06-22 – 2022-06-27 (×6): 2 mg via ORAL
  Filled 2022-06-22 (×8): qty 1

## 2022-06-22 MED ORDER — VITAMIN D 25 MCG (1000 UNIT) PO TABS
2000.0000 [IU] | ORAL_TABLET | Freq: Every day | ORAL | Status: DC
  Administered 2022-06-23 – 2022-06-27 (×5): 2000 [IU] via ORAL
  Filled 2022-06-22 (×5): qty 2

## 2022-06-22 MED ORDER — MIDAZOLAM HCL 2 MG/2ML IJ SOLN
INTRAMUSCULAR | Status: AC | PRN
  Administered 2022-06-22: .5 mg via INTRAVENOUS

## 2022-06-22 MED ORDER — LIDOCAINE HCL 1 % IJ SOLN
INTRAMUSCULAR | Status: AC
  Administered 2022-06-22: 10 mL
  Filled 2022-06-22: qty 20

## 2022-06-22 MED ORDER — NOREPINEPHRINE 4 MG/250ML-% IV SOLN
2.0000 ug/min | INTRAVENOUS | Status: DC
  Administered 2022-06-22: 2 ug/min via INTRAVENOUS
  Administered 2022-06-23: 5 ug/min via INTRAVENOUS
  Administered 2022-06-24: 4 ug/min via INTRAVENOUS
  Administered 2022-06-24: 5 ug/min via INTRAVENOUS
  Filled 2022-06-22 (×4): qty 250

## 2022-06-22 MED ORDER — SODIUM CHLORIDE 0.9 % IV SOLN: 2.0000 g | Freq: Once | INTRAVENOUS | Status: DC

## 2022-06-22 MED ORDER — LEVOTHYROXINE SODIUM 50 MCG PO TABS
150.0000 ug | ORAL_TABLET | Freq: Every day | ORAL | Status: DC
  Administered 2022-06-23 – 2022-06-26 (×3): 150 ug via ORAL
  Filled 2022-06-22 (×4): qty 1

## 2022-06-22 MED ORDER — HYDROCODONE-ACETAMINOPHEN 5-325 MG PO TABS
1.0000 | ORAL_TABLET | ORAL | Status: DC | PRN
  Administered 2022-06-22 – 2022-06-24 (×3): 1 via ORAL
  Filled 2022-06-22 (×3): qty 1

## 2022-06-22 MED ORDER — LIDOCAINE-PRILOCAINE 2.5-2.5 % EX CREA: 1.0000 | TOPICAL_CREAM | CUTANEOUS | Status: DC

## 2022-06-22 MED ORDER — SENNA 8.6 MG PO TABS
2.0000 | ORAL_TABLET | Freq: Every evening | ORAL | Status: DC | PRN
  Administered 2022-06-25 – 2022-06-26 (×2): 17.2 mg via ORAL
  Filled 2022-06-22 (×2): qty 2

## 2022-06-22 MED ORDER — UMECLIDINIUM-VILANTEROL 62.5-25 MCG/ACT IN AEPB
1.0000 | INHALATION_SPRAY | Freq: Every day | RESPIRATORY_TRACT | Status: DC
  Administered 2022-06-24 – 2022-06-27 (×4): 1 via RESPIRATORY_TRACT
  Filled 2022-06-22: qty 14

## 2022-06-22 MED ORDER — INSULIN ASPART 100 UNIT/ML IJ SOLN
0.0000 [IU] | INTRAMUSCULAR | Status: DC
  Filled 2022-06-22: qty 1

## 2022-06-22 MED ORDER — SEVELAMER CARBONATE 800 MG PO TABS
2400.0000 mg | ORAL_TABLET | Freq: Three times a day (TID) | ORAL | Status: DC
  Administered 2022-06-22 – 2022-06-27 (×12): 2400 mg via ORAL
  Filled 2022-06-22 (×13): qty 3

## 2022-06-22 MED ORDER — METRONIDAZOLE 500 MG/100ML IV SOLN
500.0000 mg | Freq: Two times a day (BID) | INTRAVENOUS | Status: DC
  Administered 2022-06-22: 500 mg via INTRAVENOUS
  Filled 2022-06-22: qty 100

## 2022-06-22 MED ORDER — PIPERACILLIN-TAZOBACTAM IN DEX 2-0.25 GM/50ML IV SOLN
2.2500 g | Freq: Three times a day (TID) | INTRAVENOUS | Status: AC
  Administered 2022-06-22 – 2022-06-25 (×11): 2.25 g via INTRAVENOUS
  Filled 2022-06-22 (×13): qty 50

## 2022-06-22 MED ORDER — ALPRAZOLAM 0.25 MG PO TABS
0.2500 mg | ORAL_TABLET | ORAL | Status: DC
  Administered 2022-06-23: 0.25 mg via ORAL
  Filled 2022-06-22: qty 1

## 2022-06-22 MED ORDER — ONDANSETRON HCL 4 MG PO TABS: 4.0000 mg | ORAL_TABLET | Freq: Four times a day (QID) | ORAL | Status: DC | PRN

## 2022-06-22 MED ORDER — FENTANYL CITRATE (PF) 100 MCG/2ML IJ SOLN
INTRAMUSCULAR | Status: AC
  Filled 2022-06-22: qty 2

## 2022-06-22 MED ORDER — ALBUMIN HUMAN 25 % IV SOLN
25.0000 g | Freq: Once | INTRAVENOUS | Status: AC
  Administered 2022-06-22: 25 g via INTRAVENOUS
  Filled 2022-06-22: qty 100

## 2022-06-22 MED ORDER — TRAZODONE HCL 50 MG PO TABS
50.0000 mg | ORAL_TABLET | Freq: Every day | ORAL | Status: DC
  Administered 2022-06-22 – 2022-06-27 (×6): 50 mg via ORAL
  Filled 2022-06-22 (×6): qty 1

## 2022-06-22 MED ORDER — KCL IN DEXTROSE-NACL 20-5-0.9 MEQ/L-%-% IV SOLN
INTRAVENOUS | Status: AC
  Filled 2022-06-22: qty 1000

## 2022-06-22 MED ORDER — BACITRACIN-NEOMYCIN-POLYMYXIN OINTMENT TUBE
1.0000 | TOPICAL_OINTMENT | CUTANEOUS | Status: DC
  Filled 2022-06-22: qty 14.17

## 2022-06-22 MED ORDER — ALBUTEROL SULFATE (2.5 MG/3ML) 0.083% IN NEBU: 3.0000 mL | INHALATION_SOLUTION | Freq: Four times a day (QID) | RESPIRATORY_TRACT | Status: DC | PRN

## 2022-06-22 MED ORDER — MIDAZOLAM HCL 2 MG/2ML IJ SOLN
INTRAMUSCULAR | Status: AC
  Filled 2022-06-22: qty 2

## 2022-06-22 MED ORDER — ONDANSETRON HCL 4 MG/2ML IJ SOLN: 4.0000 mg | Freq: Four times a day (QID) | INTRAMUSCULAR | Status: DC | PRN

## 2022-06-22 MED ORDER — CHLORHEXIDINE GLUCONATE CLOTH 2 % EX PADS
6.0000 | MEDICATED_PAD | Freq: Every day | CUTANEOUS | Status: DC
  Administered 2022-06-22 – 2022-06-25 (×3): 6 via TOPICAL

## 2022-06-22 MED ORDER — ORAL CARE MOUTH RINSE: 15.0000 mL | OROMUCOSAL | Status: DC | PRN

## 2022-06-22 NOTE — Progress Notes (Signed)
Spoke with Primary RN  about [facility] PIV and the pressors are not needed at this time . Patient has 2 working PIVs at this time . Primary RN will consult the IV team if [facility] PIV is needed.

## 2022-06-22 NOTE — Consult Note (Signed)
Chief Complaint: Patient was seen in consultation today for cholecystitis.  Referring Physician(s): Caroleen Hamman, MD  Supervising Physician: Arne Cleveland  Patient Status: [place] - In-pt  History of Present Illness: Frank [name] is a 81 y.o. male with PMH of ESRD, diabetes mellitus, second degree heart block and stroke being seen today for cholecystitis. Patient presented to [facility] ED with 1-2 days of upper abdominal pain, nausea, and vomiting. On presentation, patient underwent CT Abdomen/Pelvis which was concerning for cholecystitis. Surgery was consulted and felt that patient was not a good candidate for surgical intervention. IR was consulted to perform image-guided percutaneous cholecystostomy drain placement.  Past Medical History:  Diagnosis Date   Anemia of chronic kidney failure    Arthritis    Benign prostate hyperplasia    Bradycardia    Cataracts, bilateral 05/15/2012   Chronic idiopathic gout of multiple sites 02/10/2015   Chronic kidney disease    [facility]- ESRD   Depression    Diabetes mellitus without complication ([place])    diet controlled    Diabetic nephropathy ([place])    ED (erectile dysfunction) 08/28/2012   Elevated liver enzymes 06/10/2019   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 HTN PLAN   BP goal is <140/90 BP Readings from Last 3 Encounters:  08/27/16 140/79  08/13/16 141/80  08/06/16 144/86   Current Anti-Hyptensive Medications: None  Today's Recommendations: Continue lifestyle modifications. Defer starting medication to PCP.  Recommend continued regular exercise as tolerated. Recomm   History of UTI    s/p foley in hospital 01/2019    Hyperlipidemia    Hypothyroidism    Illiterate 11/19/2013   Last Assessment & Plan:  Assisted patient with Handicapped Placard application today.      Insomnia 05/08/2019   Neuropathy    Peripheral neuropathy    Peripheral vascular disease ([place])    Second degree heart block    Secondary hyperparathyroidism ([place])    Renal   Stroke ([place]) 2021   Thyroid disease     Past Surgical History:  Procedure Laterality Date   AV FISTULA PLACEMENT Left 11/19/2017   Procedure: ARTERIOVENOUS (AV) FISTULA CREATION;  Surgeon: [name], MD;  Location: [place];  Service: Vascular;  Laterality: Left;   AV FISTULA PLACEMENT Right 10/27/2021   Procedure: CREATION OF BRACHIOCEPHALIC RIGHT ARM ARTERIOVENOUS (AV) FISTULA;  Surgeon: [name], MD;  Location: [place];  Service: Vascular;  Laterality: Right;   [place] Left 06/04/2018   Procedure: BASILIC VEIN TRANSPOSITION ARM;  Surgeon: [name], MD;  Location: [place];  Service: Vascular;  Laterality: Left;   COLONOSCOPY     DIALYSIS/PERMA CATHETER REMOVAL N/A 03/23/2019   Procedure: DIALYSIS/PERMA CATHETER REMOVAL;  Surgeon: [name], MD;  Location: [place] [facility];  Service: Cardiovascular;  Laterality: N/A;   EYE SURGERY     cataract surgery b/l; photophobia since    INSERTION OF DIALYSIS CATHETER N/A 01/23/2019   Procedure: INSERTION OF DIALYSIS CATHETER;  Surgeon: [name], MD;  Location: [facility] OR;  Service: Vascular;  Laterality: N/A;  INSERTION OF DIALYSIS CATHETER   INSERTION OF DIALYSIS CATHETER  2022   right subclavian (chest area)   IR ANGIO INTRA EXTRACRAN SEL COM CAROTID INNOMINATE UNI L MOD SED  10/15/2019   IR ANGIO VERTEBRAL  SEL SUBCLAVIAN INNOMINATE UNI R MOD SED  10/15/2019   IR CT HEAD LTD  10/15/2019   IR FLUORO GUIDE CV LINE RIGHT  10/08/2019   IR INTRAVSC STENT CERV CAROTID W/EMB-PROT MOD SED INCL ANGIO  10/15/2019   IR PERC CHOLECYSTOSTOMY  07/06/2022   IR THROMBECTOMY AV FISTULA W/THROMBOLYSIS/PTA INC/SHUNT/IMG LEFT Left 10/09/2019   IR US GUIDE VASC ACCESS LEFT  10/09/2019   IR US GUIDE VASC ACCESS  RIGHT  10/08/2019   KNEE ARTHROSCOPY Left    Knee   PERIPHERAL VASCULAR THROMBECTOMY Left 07/06/2019   Procedure: PERIPHERAL VASCULAR THROMBECTOMY;  Surgeon: [name], MD;  Location: [place] [facility];  Service: Cardiovascular;  Laterality: Left;   RADIOLOGY WITH ANESTHESIA N/A 10/15/2019   Procedure: stent placement;  Surgeon: Luanne Bras, MD;  Location: [place];  Service: Radiology;  Laterality: N/A;   THROMBECTOMY W/ EMBOLECTOMY Left 01/27/2019   Procedure: THROMBECTOMY Left arm ARTERIOVENOUS FISTULA  and Left arm Arteriovenous gortex graft;  Surgeon: [name], MD;  Location: [facility];  Service: Vascular;  Laterality: Left;   VENOGRAM Left 01/27/2019   Procedure: Left arm FISTULOGRAM/;  Surgeon: [name], MD;  Location: [place];  Service: Vascular;  Laterality: Left;    Allergies: Ace inhibitors, Angiotensin receptor blockers, Ivp dye [iodinated contrast media], and Adhesive [tape]  Medications: Prior to Admission medications   Medication Sig Start Date End Date Taking? Authorizing Provider  ALPRAZolam Duanne Moron) 0.25 MG tablet Take 0.25 mg by mouth daily with supper. 01/11/20  Yes [provider]  apixaban (ELIQUIS) 2.5 MG TABS tablet TAKE ONE TABLET BY MOUTH TWICE A DAY. **THIS MEDICINE THINS THE BLOOD** 05/02/22  Yes Shirley Friar, PA-C  Cholecalciferol (VITAMIN D) 50 MCG (2000 UT) tablet Take 2,000 Units by mouth daily. 01/24/21  Yes [provider]  donepezil (ARICEPT) 5 MG tablet Take 1 tablet (5 mg total) by mouth at bedtime. 12/23/19  Yes Melvenia Beam, MD  lidocaine-prilocaine (EMLA) cream Apply 1 application topically See admin instructions. Apply to access site 1 to 2 hours before dialysis. Cover with occlusive dressing.On Tuesday,Thursday and Saturday 04/08/19  Yes [provider]  Melatonin 10 MG TABS Take 10 mg by mouth at bedtime.   Yes [provider]  midodrine (PROAMATINE) 10 MG tablet Take 1 tablet (10 mg  total) by mouth 3 (three) times daily with meals. Patient taking differently: Take 10 mg by mouth 3 (three) times daily with meals. Use as directed 12/14/19  Yes Tillery, Satira Mccallum, PA-C  midodrine (PROAMATINE) 5 MG tablet 5 mg. Take 5 mg by mouth at 10:30 and 1 pm on Dialysis days   Yes [provider]  neomycin-bacitracin-polymyxin (NEOSPORIN) [phone] ointment Apply 1 Application topically 2 (two) times a week.   Yes [provider]  pantoprazole (PROTONIX) 40 MG tablet Take 1 tablet (40 mg total) by mouth daily. Patient taking differently: Take 40 mg by mouth daily before breakfast. 10/23/19 06/14/2022 Yes Regalado, Belkys A, MD  sevelamer carbonate (RENVELA) 800 MG tablet Take 3 tablets (2,400 mg total) by mouth 3 (three) times daily with meals. Patient taking differently: Take 2,400 mg by mouth 3 (three) times daily with meals. Use as directed 10/23/19  Yes Regalado, Belkys A, MD  Skin Protectants, Misc. (MINERIN CREME EX) Apply 1 application topically daily. Apply to both arms and legs   Yes [provider]  tamsulosin (FLOMAX) 0.4 MG CAPS capsule Take 1 capsule (0.4 mg total) by mouth daily. Patient  taking differently: Take 0.8 mg by mouth daily. 10/23/19  Yes Regalado, Belkys A, MD  tiZANidine (ZANAFLEX) 2 MG tablet Take 2 mg by mouth at bedtime. 03/27/21  Yes [provider]  traZODone (DESYREL) 50 MG tablet Take 50 mg by mouth at bedtime.   Yes [provider]  zinc oxide (BALMEX) 11.3 % CREA cream Apply 1 application topically See admin instructions. Apply to bottom and groin area 3 times daily and as needed for incontinence care   Yes [provider]  acetaminophen (TYLENOL) 325 MG tablet Take 650 mg by mouth every 6 (six) hours as needed for moderate pain.    [provider]  albuterol (VENTOLIN HFA) 108 (90 Base) MCG/ACT inhaler Inhale 2 puffs into the lungs every 6 (six) hours as needed for shortness of breath. 12/20/20    [provider]  allopurinol (ZYLOPRIM) 100 MG tablet Take 1 tablet (100 mg total) by mouth daily. 06/03/19   McLean-Scocuzza, Nino Glow, MD  ALPRAZolam Duanne Moron) 0.5 MG tablet Take 0.25 mg by mouth Every Tuesday,Thursday,and Saturday with dialysis. 09/21/21   [provider]  aspirin 81 MG chewable tablet Chew 81 mg by mouth daily.    [provider]  atorvastatin (LIPITOR) 20 MG tablet Take 20 mg by mouth daily.    [provider]  benzonatate (TESSALON) 200 MG capsule Take 200 mg by mouth 3 (three) times daily as needed for cough. Patient not taking: Reported on 07/06/2022    [provider]  bisacodyl (DULCOLAX) 10 MG suppository Place 10 mg rectally as needed for moderate constipation. Patient not taking: Reported on 06/12/2022    [provider]  Camphor-Menthol (FREEZE IT EX) Apply 1 application topically every 6 (six) hours as needed (back and neck pain).    [provider]  docusate sodium (COLACE) 100 MG capsule Take 200 mg by mouth daily as needed for moderate constipation. Patient not taking: Reported on 06/18/2022    [provider]  guaiFENesin (MUCINEX) 600 MG 12 hr tablet Take 600 mg by mouth every 12 (twelve) hours as needed (congestion).    [provider]  levothyroxine (SYNTHROID) 150 MCG tablet Take 150 mcg by mouth daily. 05/21/22   [provider]  ondansetron (ZOFRAN) 4 MG tablet Take 1 tablet (4 mg total) by mouth every 8 (eight) hours as needed. 11/06/20   Nance Pear, MD  senna (SENOKOT) 8.6 MG TABS tablet Take 2 tablets by mouth at bedtime as needed for moderate constipation.    [provider]  umeclidinium-vilanterol (ANORO ELLIPTA) 62.5-25 MCG/ACT AEPB Inhale 1 puff into the lungs daily.    [provider]     Family History  Problem Relation Age of Onset   Diabetes Mother    Kidney disease Mother    Liver disease Mother    Heart Problems Mother    Stroke  Neg Hx     Social History   Socioeconomic History   Marital status: Divorced    Spouse name: Not on file   Number of children: 4   Years of education: Not on file   Highest education level: 8th grade  Occupational History   Not on file  Tobacco Use   Smoking status: Former    Years: 30.00    Types: Cigarettes    Quit date: 1994    Years since quitting: 29.8   Smokeless tobacco: Never  Vaping Use   Vaping Use: Never used  Substance and Sexual Activity  Alcohol use: No   Drug use: No   Sexual activity: Not Currently  Other Topics Concern   Not on file  Social History Narrative   4 kids (3 daughters and 1 son)   Daughter [name] DPR      [facility]    Social Determinants of Health   Financial Resource Strain: Low Risk  (07/06/2019)   Overall Financial Resource Strain (CARDIA)    Difficulty of Paying Living Expenses: Not hard at all  Food Insecurity: No Food Insecurity (07/06/2019)   Hunger Vital Sign    Worried About Running Out of Food in the Last Year: Never true    Ran Out of Food in the Last Year: Never true  Transportation Needs: No Transportation Needs (07/06/2019)   PRAPARE - Hydrologist (Medical): No    Lack of Transportation (Non-Medical): No  Physical Activity: Not on file  Stress: No Stress Concern Present (07/06/2019)   [employer]    Feeling of Stress : Only a little  Social Connections: Unknown (07/06/2019)   Social Connection and Isolation Panel [NHANES]    Frequency of Communication with Friends and Family: More than three times a week    Frequency of Social Gatherings with Friends and Family: Not on file    Attends Religious Services: Not on file    Active Member of Clubs or Organizations: Not on file    Attends Archivist Meetings: Not on file    Marital Status: Divorced     Review of Systems: A 12 point ROS  discussed and pertinent positives are indicated in the HPI above.  All other systems are negative.  Review of Systems  Constitutional:  Negative for chills and fever.  Respiratory:  Negative for shortness of breath.   Cardiovascular:  Negative for chest pain and leg swelling.  Gastrointestinal:  Positive for abdominal pain, nausea and vomiting. Negative for diarrhea.  Neurological:  Negative for dizziness and headaches.  Psychiatric/Behavioral:  Negative for confusion.     Vital Signs: BP (!) 92/58   Pulse 68   Temp 99 F (37.2 C) (Oral)   Resp (!) 23   Ht 5\' 8"  (1.727 m)   Wt 189 lb 6 oz (85.9 kg)   SpO2 98%   BMI 28.79 kg/m    Physical Exam Vitals reviewed.  HENT:     Head: Normocephalic.     Mouth/Throat:     Mouth: Mucous membranes are moist.  Cardiovascular:     Rate and Rhythm: Normal rate and regular rhythm.     Pulses: Normal pulses.     Heart sounds: Normal heart sounds.  Pulmonary:     Effort: Pulmonary effort is normal.     Breath sounds: Normal breath sounds.  Abdominal:     General: Bowel sounds are normal. There is no distension.     Palpations: Abdomen is soft.     Tenderness: There is abdominal tenderness.  Skin:    General: Skin is warm and dry.  Neurological:     Mental Status: He is alert and oriented to person, place, and time.  Psychiatric:        Mood and Affect: Mood normal.        Behavior: Behavior normal.     Comments: ED had reported slight confusion, but patient seemed alert, oriented, and with appropriate affect on exam     Imaging: IR Perc Cholecystostomy  Result Date: 07/01/2022 CLINICAL DATA:  Cholelithiasis, gallbladder wall thickening and edema suggesting acute cholecystitis, sepsis. General surgery requested consultation for cholecystostomy catheter placement. EXAM: PERCUTANEOUS CHOLECYSTOSTOMY TUBE PLACEMENT WITH ULTRASOUND AND FLUOROSCOPIC GUIDANCE FLUOROSCOPY: Radiation Exposure Index (as provided by the fluoroscopic  device): 6.6 mGy air Kerma TECHNIQUE: The procedure, risks (including but not limited to bleeding, infection, organ damage ), benefits, and alternatives were explained to the patient. Questions regarding the procedure were encouraged and answered. The patient understands and consents to the procedure. Survey ultrasound of the abdomen was performed and an appropriate skin entry site was identified. Skin site was marked, prepped with Betadine, and draped in usual sterile fashion, and infiltrated locally with 1% lidocaine. Intravenous Fentanyl 33mcg and Versed 0.5mg  were administered as conscious sedation during continuous monitoring of the patient's level of consciousness and physiological / cardiorespiratory status by the radiology RN, with a total moderate sedation time of 10 minutes. Under real-time ultrasound guidance, gallbladder was accessed using a transhepatic approach with a 21-gauge needle. Ultrasound image documentation was saved. Bile returned through the hub. Needle was exchanged over a 018 guidewire for transitional dilator which allowed placement of 035 wire. Over this, a 10.2 French pigtail catheter was advanced and formed centrally in the gallbladder lumen. Small contrast injection confirmed appropriate position. Catheter secured externally with 0 Prolene suture and placed external drain bag. Patient tolerated the procedure well. COMPLICATIONS: COMPLICATIONS none IMPRESSION: 1. Technically successful percutaneous cholecystostomy tube placement with ultrasound and fluoroscopic guidance. Electronically Signed   By: Lucrezia Europe M.D.   On: 06/14/2022 15:07   CT ABDOMEN PELVIS WO CONTRAST  Result Date: 07/10/2022 CLINICAL DATA:  Acute nonlocalized abdominal pain. Abdominal pain for 1 day with nausea and vomiting. EXAM: CT ABDOMEN AND PELVIS WITHOUT CONTRAST TECHNIQUE: Multidetector CT imaging of the abdomen and pelvis was performed following the standard protocol without IV contrast. RADIATION DOSE  REDUCTION: This exam was performed according to the departmental dose-optimization program which includes automated exposure control, adjustment of the mA and/or kV according to patient size and/or use of iterative reconstruction technique. COMPARISON:  01/10/2021 FINDINGS: Lower chest: Small right pleural effusion with atelectasis or consolidation in the right lung base. Hepatobiliary: No focal liver lesions. Cholelithiasis with stone in the gallbladder. Gallbladder wall thickening and pericholecystic edema. Changes may represent acute cholecystitis in the appropriate clinical setting. No bile duct dilatation. Pancreas: Unremarkable. No pancreatic ductal dilatation or surrounding inflammatory changes. Spleen: Normal in size without focal abnormality. Adrenals/Urinary Tract: No adrenal gland nodules. Kidneys are atrophic. No hydronephrosis or hydroureter. No renal or ureteral stones. Bladder wall is thickened possibly indicating cystitis or outlet obstruction. Stomach/Bowel: Stomach, small bowel, and colon are mostly decompressed. Scattered stool in the colon. No wall thickening or inflammatory changes. Appendix is normal. Vascular/Lymphatic: Aortic atherosclerosis. No enlarged abdominal or pelvic lymph nodes. Reproductive: Prostate gland is enlarged, measuring 5.1 cm diameter. Other: Small amount of free fluid in the pelvis. Free fluid has mildly increased attenuation which may indicate hemorrhagic or proteinaceous fluid. No source is identified. No free air to suggest perforation. Musculoskeletal: Degenerative changes in the spine. No destructive bone lesions. IMPRESSION: 1. Cholelithiasis with gallbladder wall thickening and edema suggesting acute cholecystitis. 2. Small amount of free fluid in the pelvis. Increased density in the fluid may indicate hemorrhagic fluid or proteinaceous fluid. Source is not identified. 3. Prostate gland enlargement. Bladder wall thickening likely due to outlet obstruction but could  indicate cystitis. 4. Small right pleural effusion with basilar atelectasis or consolidation. These  results were called by telephone at the time of interpretation on 06/30/2022 at 3:10 am to provider [facility] , who verbally acknowledged these results. Electronically Signed   By: Lucienne Capers M.D.   On: 07/04/2022 03:17   DG Chest 2 View  Result Date: 07/07/2022 CLINICAL DATA:  Chest pain EXAM: CHEST - 2 VIEW COMPARISON:  10/21/2019 FINDINGS: Bibasilar atelectasis.  No pleural effusion or pneumothorax. The heart is top-normal in size. Degenerative changes of the visualized thoracolumbar spine. IMPRESSION: Bibasilar atelectasis. Electronically Signed   By: Julian Hy M.D.   On: 07/04/2022 01:32    Labs:  CBC: Recent Labs    10/27/21 0811 06/18/2022 0110  WBC  --  3.1*  HGB 12.2* 13.0  HCT 36.0* 43.5  PLT  --  153    COAGS: Recent Labs    06/14/2022 0348  INR 1.4*  APTT 38*    BMP: Recent Labs    10/27/21 0811 06/30/2022 0110  NA 136 138  K 4.1 3.2*  CL 97* 94*  CO2  --  25  GLUCOSE 108* 163*  BUN 27* 22  CALCIUM  --  8.7*  CREATININE 5.50* 4.05*  GFRNONAA  --  14*    LIVER FUNCTION TESTS: Recent Labs    07/08/2022 0110  BILITOT 1.2  AST 23  ALT 9  ALKPHOS 78  PROT 7.6  ALBUMIN 4.0    TUMOR MARKERS: No results for input(s): "AFPTM", "CEA", "CA199", "CHROMGRNA" in the last 8760 hours.  Assessment and Plan:  Frank [name] is an 81 yo male being seen today for cholecystitis. Request was received for image-guided percutaneous cholecystostomy drain placement. Case has been reviewed and approved by Dr Vernard Gambles. Patient's daughter was called for consent given the reports of confusion from the ED.  Risks and benefits discussed with the patient's daughter including, but not limited to bleeding, infection, gallbladder perforation, bile leak, sepsis or even death.  All of the patient's daughter's questions were answered, patient is agreeable to  proceed. Consent signed and in chart.  Thank you for this interesting consult.  I greatly enjoyed meeting SCHYLER COUNSELL and look forward to participating in their care.  A copy of this report was sent to the requesting provider on this date.  Electronically Signed: Lura Em, PA 06/14/2022, 3:28 PM   I spent a total of 40 Minutes    in face to face in clinical consultation, greater than 50% of which was counseling/coordinating care for cholecystitis.

## 2022-06-22 NOTE — Consult Note (Signed)
[facility] SURGICAL CONSULTATION NOTE (initial) - cpt: 15400   HISTORY OF PRESENT ILLNESS (HPI):  81 y.o. male presented to [facility] ED overnight for evaluation of abdominal pain. Patient is somewhat poor historian. Reports 1-2 day history of upper abdominal pain with nausea and emesis. No fever, chills, urinary changes, or bowel changes. Further history is difficult to obtain. Patient has quite significant comorbidities including ESRD on HD, HTN, HLD, need for anticoagulation. Work up in the ED revealed leukopenia to 3.1K, BMP consistent with ESRD, LFTs and Bilirubin levels are normal, lipase level is normal at 38, mild lactic acidosis to 2.5 (repeat pending). He did undergo CT Abdomen/Pelvis which was concerning for cholecystitis. He was admitted to the medicine service   Surgery is consulted by emergency medicine physician Dr. Vladimir Crofts, MD in this context for evaluation and management of acute cholecystitis.  PAST MEDICAL HISTORY (PMH):  Past Medical History:  Diagnosis Date   Anemia of chronic kidney failure    Arthritis    Benign prostate hyperplasia    Bradycardia    Cataracts, bilateral 05/15/2012   Chronic idiopathic gout of multiple sites 02/10/2015   Chronic kidney disease    [facility]- ESRD   Depression    Diabetes mellitus without complication ([place])    diet controlled    Diabetic nephropathy ([place])    ED (erectile dysfunction) 08/28/2012   Elevated liver enzymes 06/10/2019   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 HTN PLAN   BP goal is <140/90 BP Readings from Last 3 Encounters:  08/27/16 140/79  08/13/16 141/80  08/06/16 144/86   Current Anti-Hyptensive Medications: None  Today's Recommendations: Continue lifestyle modifications. Defer starting medication to PCP.  Recommend continued  regular exercise as tolerated. Recomm   History of UTI    s/p foley in hospital 01/2019    Hyperlipidemia    Hypothyroidism    Illiterate 11/19/2013   Last Assessment & Plan:  Assisted patient with Handicapped Placard application today.     Insomnia 05/08/2019   Neuropathy    Peripheral neuropathy    Peripheral vascular disease ([place])    Second degree heart block    Secondary hyperparathyroidism ([place])    Renal   Stroke ([place]) 2021   Thyroid disease      PAST SURGICAL HISTORY ([place]):  Past Surgical History:  Procedure Laterality Date   AV FISTULA PLACEMENT Left 11/19/2017   Procedure: ARTERIOVENOUS (AV) FISTULA CREATION;  Surgeon: [name], MD;  Location: [place];  Service: Vascular;  Laterality: Left;   AV FISTULA PLACEMENT Right 10/27/2021   Procedure: CREATION OF BRACHIOCEPHALIC RIGHT ARM ARTERIOVENOUS (AV) FISTULA;  Surgeon: [name], MD;  Location: [place];  Service: Vascular;  Laterality: Right;   [place] Left 06/04/2018   Procedure: BASILIC VEIN TRANSPOSITION ARM;  Surgeon: [name], MD;  Location: [place];  Service: Vascular;  Laterality: Left;   COLONOSCOPY     DIALYSIS/PERMA CATHETER REMOVAL N/A 03/23/2019   Procedure: DIALYSIS/PERMA CATHETER REMOVAL;  Surgeon: [name], MD;  Location: [place] [facility];  Service: Cardiovascular;  Laterality: N/A;   EYE SURGERY     cataract surgery b/l; photophobia since    INSERTION OF DIALYSIS CATHETER N/A 01/23/2019   Procedure: INSERTION OF DIALYSIS CATHETER;  Surgeon: [name], MD;  Location: [facility];  Service: Vascular;  Laterality: N/A;  INSERTION OF DIALYSIS CATHETER   INSERTION OF DIALYSIS CATHETER  2022   right subclavian (chest area)   IR ANGIO INTRA EXTRACRAN SEL COM CAROTID INNOMINATE UNI L MOD SED  10/15/2019   IR ANGIO VERTEBRAL SEL SUBCLAVIAN INNOMINATE UNI R MOD SED  10/15/2019   IR CT HEAD LTD  10/15/2019   IR FLUORO GUIDE CV LINE RIGHT   10/08/2019   IR INTRAVSC STENT CERV CAROTID W/EMB-PROT MOD SED INCL ANGIO  10/15/2019   IR THROMBECTOMY AV FISTULA W/THROMBOLYSIS/PTA INC/SHUNT/IMG LEFT Left 10/09/2019   IR US GUIDE VASC ACCESS LEFT  10/09/2019   IR US GUIDE VASC ACCESS RIGHT  10/08/2019   KNEE ARTHROSCOPY Left    Knee   PERIPHERAL VASCULAR THROMBECTOMY Left 07/06/2019   Procedure: PERIPHERAL VASCULAR THROMBECTOMY;  Surgeon: [name], MD;  Location: [place] [facility];  Service: Cardiovascular;  Laterality: Left;   RADIOLOGY WITH ANESTHESIA N/A 10/15/2019   Procedure: stent placement;  Surgeon: Luanne Bras, MD;  Location: [place];  Service: Radiology;  Laterality: N/A;   THROMBECTOMY W/ EMBOLECTOMY Left 01/27/2019   Procedure: THROMBECTOMY Left arm ARTERIOVENOUS FISTULA  and Left arm Arteriovenous gortex graft;  Surgeon: [name], MD;  Location: [facility];  Service: Vascular;  Laterality: Left;   VENOGRAM Left 01/27/2019   Procedure: Left arm FISTULOGRAM/;  Surgeon: [name], MD;  Location: [place];  Service: Vascular;  Laterality: Left;     MEDICATIONS:  Prior to Admission medications   Medication Sig Start Date End Date Taking? Authorizing Provider  ALPRAZolam Duanne Moron) 0.25 MG tablet Take 0.25 mg by mouth daily with supper. 01/11/20  Yes [provider]  apixaban (ELIQUIS) 2.5 MG TABS tablet TAKE ONE TABLET BY MOUTH TWICE A DAY. **THIS MEDICINE THINS THE BLOOD** 05/02/22  Yes Shirley Friar, PA-C  Cholecalciferol (VITAMIN D) 50 MCG (2000 UT) tablet Take 2,000 Units by mouth daily. 01/24/21  Yes [provider]  donepezil (ARICEPT) 5 MG tablet Take 1 tablet (5 mg total) by mouth at bedtime. 12/23/19  Yes Melvenia Beam, MD  lidocaine-prilocaine (EMLA) cream Apply 1 application topically See admin instructions. Apply to access site 1 to 2 hours before dialysis. Cover with occlusive dressing.On Tuesday,Thursday and Saturday 04/08/19  Yes [provider]  Melatonin 10 MG  TABS Take 10 mg by mouth at bedtime.   Yes [provider]  midodrine (PROAMATINE) 10 MG tablet Take 1 tablet (10 mg total) by mouth 3 (three) times daily with meals. Patient taking differently: Take 10 mg by mouth 3 (three) times daily with meals. Use as directed 12/14/19  Yes Tillery, Satira Mccallum, PA-C  midodrine (PROAMATINE) 5 MG tablet 5 mg. Take 5 mg by mouth at 10:30 and 1 pm on Dialysis days   Yes [provider]  neomycin-bacitracin-polymyxin (NEOSPORIN) [phone] ointment Apply 1 Application topically 2 (two) times a week.   Yes [provider]  pantoprazole (PROTONIX) 40 MG tablet Take 1 tablet (40 mg total) by mouth daily. Patient taking differently: Take 40 mg by mouth daily before breakfast. 10/23/19 06/15/2022 Yes Regalado, Belkys A, MD  sevelamer carbonate (RENVELA) 800 MG tablet Take 3 tablets (2,400 mg total) by mouth 3 (three) times daily with meals. Patient taking differently: Take 2,400 mg by mouth 3 (three) times daily with meals. Use as directed 10/23/19  Yes Regalado, Belkys A, MD  Skin Protectants, Misc. (MINERIN CREME EX) Apply 1 application topically daily.  Apply to both arms and legs   Yes [provider]  tamsulosin (FLOMAX) 0.4 MG CAPS capsule Take 1 capsule (0.4 mg total) by mouth daily. Patient taking differently: Take 0.8 mg by mouth daily. 10/23/19  Yes Regalado, Belkys A, MD  tiZANidine (ZANAFLEX) 2 MG tablet Take 2 mg by mouth at bedtime. 03/27/21  Yes [provider]  traZODone (DESYREL) 50 MG tablet Take 50 mg by mouth at bedtime.   Yes [provider]  zinc oxide (BALMEX) 11.3 % CREA cream Apply 1 application topically See admin instructions. Apply to bottom and groin area 3 times daily and as needed for incontinence care   Yes [provider]  acetaminophen (TYLENOL) 325 MG tablet Take 650 mg by mouth every 6 (six) hours as needed for moderate pain.    [provider]  albuterol (VENTOLIN HFA)  108 (90 Base) MCG/ACT inhaler Inhale 2 puffs into the lungs every 6 (six) hours as needed for shortness of breath. 12/20/20   [provider]  allopurinol (ZYLOPRIM) 100 MG tablet Take 1 tablet (100 mg total) by mouth daily. 06/03/19   McLean-Scocuzza, Nino Glow, MD  ALPRAZolam Duanne Moron) 0.5 MG tablet Take 0.25 mg by mouth Every Tuesday,Thursday,and Saturday with dialysis. 09/21/21   [provider]  aspirin 81 MG chewable tablet Chew 81 mg by mouth daily.    [provider]  atorvastatin (LIPITOR) 20 MG tablet Take 20 mg by mouth daily.    [provider]  benzonatate (TESSALON) 200 MG capsule Take 200 mg by mouth 3 (three) times daily as needed for cough. Patient not taking: Reported on 06/10/2022    [provider]  bisacodyl (DULCOLAX) 10 MG suppository Place 10 mg rectally as needed for moderate constipation. Patient not taking: Reported on 06/21/2022    [provider]  Camphor-Menthol (FREEZE IT EX) Apply 1 application topically every 6 (six) hours as needed (back and neck pain).    [provider]  docusate sodium (COLACE) 100 MG capsule Take 200 mg by mouth daily as needed for moderate constipation. Patient not taking: Reported on 06/12/2022    [provider]  guaiFENesin (MUCINEX) 600 MG 12 hr tablet Take 600 mg by mouth every 12 (twelve) hours as needed (congestion).    [provider]  levothyroxine (SYNTHROID) 150 MCG tablet Take 150 mcg by mouth daily. 05/21/22   [provider]  ondansetron (ZOFRAN) 4 MG tablet Take 1 tablet (4 mg total) by mouth every 8 (eight) hours as needed. 11/06/20   Nance Pear, MD  senna (SENOKOT) 8.6 MG TABS tablet Take 2 tablets by mouth at bedtime as needed for moderate constipation.    [provider]  umeclidinium-vilanterol (ANORO ELLIPTA) 62.5-25 MCG/ACT AEPB Inhale 1 puff into the lungs daily.    [provider]     ALLERGIES:  Allergies   Allergen Reactions   Ace Inhibitors Other (See Comments)    Hyperkalemia on ACE INHIBITORS Cr>1.9   Angiotensin Receptor Blockers Other (See Comments)    Hyperkalemia Elevates Cr > 1.9    Ivp Dye [Iodinated Contrast Media] Other (See Comments)    Cannot have due to renal failure   Adhesive [Tape] Other (See Comments)    UNDESCRIBED REACTION Can tolerate ONLY Coban wrap or mild paper tape!!     SOCIAL HISTORY:  Social History   Socioeconomic History   Marital status: Divorced    Spouse name: Not on file   Number of children: 4  Years of education: Not on file   Highest education level: 8th grade  Occupational History   Not on file  Tobacco Use   Smoking status: Former    Years: 30.00    Types: Cigarettes    Quit date: 81    Years since quitting: 29.8   Smokeless tobacco: Never  Vaping Use   Vaping Use: Never used  Substance and Sexual Activity   Alcohol use: No   Drug use: No   Sexual activity: Not Currently  Other Topics Concern   Not on file  Social History Narrative   4 kids (3 daughters and 1 son)   Daughter [name] DPR      Lives at [facility]    Social Determinants of Health   Financial Resource Strain: Low Risk  (07/06/2019)   Overall Financial Resource Strain (CARDIA)    Difficulty of Paying Living Expenses: Not hard at all  Food Insecurity: No Food Insecurity (07/06/2019)   Hunger Vital Sign    Worried About Running Out of Food in the Last Year: Never true    [employer] in the Last Year: Never true  Transportation Needs: No Transportation Needs (07/06/2019)   PRAPARE - Hydrologist (Medical): No    Lack of Transportation (Non-Medical): No  Physical Activity: Not on file  Stress: No Stress Concern Present (07/06/2019)   [place]    Feeling of Stress : Only a little  Social Connections: Unknown (07/06/2019)   Social  Connection and Isolation Panel [NHANES]    Frequency of Communication with Friends and Family: More than three times a week    Frequency of Social Gatherings with Friends and Family: Not on file    Attends Religious Services: Not on file    Active Member of Clubs or Organizations: Not on file    Attends Archivist Meetings: Not on file    Marital Status: Divorced  Intimate Partner Violence: Not At Risk (07/06/2019)   Humiliation, Afraid, Rape, and Kick questionnaire    Fear of Current or Ex-Partner: No    Emotionally Abused: No    Physically Abused: No    Sexually Abused: No     FAMILY HISTORY:  Family History  Problem Relation Age of Onset   Diabetes Mother    Kidney disease Mother    Liver disease Mother    Heart Problems Mother    Stroke Neg Hx       REVIEW OF SYSTEMS:  Review of Systems  Constitutional:  Negative for chills and fever.  HENT:  Negative for congestion and sore throat.   Cardiovascular:  Negative for chest pain and palpitations.  Gastrointestinal:  Positive for abdominal pain, nausea and vomiting.  All other systems reviewed and are negative.   VITAL SIGNS:  Temp:  [100 F (37.8 C)-103 F (39.4 C)] 103 F (39.4 C) (10/13 0337) Pulse Rate:  [78-103] 87 (10/13 0700) Resp:  [18-20] 20 (10/13 0700) BP: (81-128)/(43-98) 92/49 (10/13 0700) SpO2:  [87 %-100 %] 100 % (10/13 0700) Weight:  [85.7 kg] 85.7 kg (10/13 0055)     Height: 5\' 8"  (172.7 cm) Weight: 85.7 kg BMI (Calculated): 28.74   INTAKE/OUTPUT:  10/12 0701 - 10/13 0700 In: 1150 [IV Piggyback:1150] Out: -   PHYSICAL EXAM:  Physical Exam Vitals and nursing note reviewed. Exam conducted with a chaperone present.  Constitutional:      General: He  is not in acute distress.    Appearance: He is normal weight. He is not ill-appearing.     Comments: Patient resting in bed, somewhat disheveled, NAD  HENT:     Head: Normocephalic and atraumatic.  Eyes:     General: No scleral icterus.     Extraocular Movements: Extraocular movements intact.  Cardiovascular:     Rate and Rhythm: Normal rate.     Heart sounds: Normal heart sounds.  Pulmonary:     Effort: Pulmonary effort is normal. No respiratory distress.     Comments: On [place] Abdominal:     General: Abdomen is flat. There is no distension.     Palpations: Abdomen is soft.     Tenderness: There is abdominal tenderness in the right upper quadrant. There is no guarding or rebound. Positive signs include Murphy's sign.     Comments: Abdomen is soft, he is markedly tender in the RUQ with positive Murphy's Sign, non-distended, no rebound/guarding   Genitourinary:    Comments: Deferred Skin:    General: Skin is warm and dry.     Coloration: Skin is not jaundiced or pale.  Neurological:     General: No focal deficit present.     Mental Status: He is alert and oriented to person, place, and time.  Psychiatric:        Mood and Affect: Mood normal.        Behavior: Behavior normal.      Labs:     Latest Ref Rng & Units 07/08/2022    1:10 AM 10/27/2021    8:11 AM 01/10/2021    7:32 AM  CBC  WBC 4.0 - 10.5 K/uL 3.1   11.1   Hemoglobin 13.0 - 17.0 g/dL 13.0  12.2  12.0   Hematocrit 39.0 - 52.0 % 43.5  36.0  37.0   Platelets 150 - 400 K/uL 153   170       Latest Ref Rng & Units 07/04/2022    1:10 AM 10/27/2021    8:11 AM 01/10/2021    7:32 AM  CMP  Glucose 70 - 99 mg/dL 163  108  157   BUN 8 - 23 mg/dL 22  27  84   Creatinine 0.61 - 1.24 mg/dL 4.05  5.50  9.65   Sodium 135 - 145 mmol/L 138  136  137   Potassium 3.5 - 5.1 mmol/L 3.2  4.1  5.5   Chloride 98 - 111 mmol/L 94  97  98   CO2 22 - 32 mmol/L 25   25   Calcium 8.9 - 10.3 mg/dL 8.7   9.6   Total Protein 6.5 - 8.1 g/dL 7.6   8.2   Total Bilirubin 0.3 - 1.2 mg/dL 1.2   0.8   Alkaline Phos 38 - 126 U/L 78   103   AST 15 - 41 U/L 23   27   ALT 0 - 44 U/L 9   17     Imaging studies:   CT Abdomen/Pelvis (06/24/2022) personally reviewed cholelithiasis with  thickened gallbladder and surrounding inflammation concerning for acute cholecystitis, and radiologist report reviewed below:   IMPRESSION: 1. Cholelithiasis with gallbladder wall thickening and edema suggesting acute cholecystitis. 2. Small amount of free fluid in the pelvis. Increased density in the fluid may indicate hemorrhagic fluid or proteinaceous fluid. Source is not identified. 3. Prostate gland enlargement. Bladder wall thickening likely due to outlet obstruction but could indicate cystitis. 4. Small right pleural effusion with  basilar atelectasis or consolidation.   Assessment/Plan: (ICD-10's: K81.0) 81 y.o. male with right upper quadrant abdominal pain found to have acute cholecystitis, complicated by significant comorbid conditions and need for anticoagulation.   - Appreciate medicine admission - Given his comorbidities and need for anticoagulation (Eliquis), recommend temporization with percutaneous cholecystostomy tube in this acute setting to allow optimization for interval cholecystectomy in a ~6-8 weeks.  Patient understanding, appreciate IR help   - NPO for potential IR procedure; IVF Resuscitation  - IV Abx (Zosyn)   - Monitor abdominal examination; on-going bowel function - Pain control prn; antiemetics prn    - further management per primary service; we will follow   All of the above findings and recommendations were discussed with the patient, and all of patient's questions were answered to his expressed satisfaction.  Thank you for the opportunity to participate in this patient's care.   -- Edison Simon, PA-C [facility] 07/06/2022, 7:45 AM M-F: 7am - 4pm

## 2022-06-22 NOTE — Assessment & Plan Note (Signed)
Stable Continue Synthroid 

## 2022-06-22 NOTE — ED Notes (Signed)
Pt brought to ED rm 26 at this time, this RN now assuming care. 

## 2022-06-22 NOTE — Assessment & Plan Note (Addendum)
Patient presents for evaluation of abdominal pain mostly in the right upper quadrant associated with nausea and vomiting. Imaging is consistent with acute cholecystitis Patient has been seen in consultation by surgery who recommend percutaneous cholecystostomy tube placement to allow for optimization for interval cholecystectomy in 6 to 8 weeks. Continue IV antibiotic therapy with Zosyn

## 2022-06-22 NOTE — ED Notes (Signed)
MD notified of pt's BP. New orders received.

## 2022-06-22 NOTE — Assessment & Plan Note (Signed)
Hold aspirin for planned procedure Continue atorvastatin

## 2022-06-22 NOTE — Assessment & Plan Note (Signed)
Noted to have mildly elevated troponin with a flat curve, most likely secondary to demand ischemia with sepsis.  No chest pain

## 2022-06-22 NOTE — Assessment & Plan Note (Addendum)
Patient admitted with severe sepsis and septic shock initially requiring pressors. IV pressors were discontinued but patient is currently on p.o. midodrine. Secondary to acute cholecystitis s/p cholecystostomy tube in place.  Cultures growing Klebsiella oxytoca and Enterococcus raffenosus.  Blood cultures remain negative. Patient initially received Zosyn followed by Unasyn. -Continue with Unasyn. -Continue with midodrine.

## 2022-06-22 NOTE — ED Notes (Addendum)
Pt CBG taken pre request and is 10

## 2022-06-22 NOTE — ED Provider Notes (Signed)
[facility] Provider Note    Event Date/Time   First MD Initiated Contact with Patient 06/25/2022 0330     (approximate)   History   Abdominal Pain   HPI  Frank [name] is a 81 y.o. male who presents to the ED for evaluation of Abdominal Pain   Patient presents to the ED for evaluation of abdominal pain.  Reports pain for the past 24 hours or so to his RUQ.  Reports objective fevers and chills alongside this and nausea without emesis or stool changes.   Physical Exam   Triage Vital Signs: ED Triage Vitals  Enc Vitals Group     BP 07/07/2022 0054 (!) 95/49     Pulse Rate 06/24/2022 0054 88     Resp 07/06/2022 0054 18     Temp 07/04/2022 0054 100 F (37.8 C)     Temp Source 06/21/2022 0054 Oral     SpO2 06/21/2022 0054 97 %     Weight 06/27/2022 0055 189 lb (85.7 kg)     Height 07/10/2022 0055 5\' 8"  (1.727 m)     Head Circumference --      Peak Flow --      Pain Score 06/14/2022 0104 8     Pain Loc --      Pain Edu? --      Excl. in [place]? --     Most recent vital signs: Vitals:   07/08/2022 0700 06/17/2022 0755  BP: (!) 92/49 (!) 82/49  Pulse: 87 65  Resp: 20 18  Temp:    SpO2: 100% 100%    General: Awake, no distress.  Warm to touch. CV:  Good peripheral perfusion.  Resp:  Normal effort.  Abd:  No distention.  Quite tender to the RUQ with guarding, lesser tenderness to the suprapubic abdomen MSK:  No deformity noted.  Neuro:  No focal deficits appreciated. Other:     ED Results / Procedures / Treatments   Labs (all labs ordered are listed, but only abnormal results are displayed) Labs Reviewed  COMPREHENSIVE METABOLIC PANEL - Abnormal; Notable for the following components:      Result Value   Potassium 3.2 (*)    Chloride 94 (*)    Glucose, Bld 163 (*)    Creatinine, Ser 4.05 (*)    Calcium 8.7 (*)    GFR, Estimated 14 (*)    Anion gap 19 (*)    All other components within normal limits  CBC - Abnormal; Notable for the following components:    WBC 3.1 (*)    MCV 100.9 (*)    MCHC 29.9 (*)    All other components within normal limits  LACTIC ACID, PLASMA - Abnormal; Notable for the following components:   Lactic Acid, Venous 2.5 (*)    All other components within normal limits  PROTIME-INR - Abnormal; Notable for the following components:   Prothrombin Time 17.3 (*)    INR 1.4 (*)    All other components within normal limits  APTT - Abnormal; Notable for the following components:   aPTT 38 (*)    All other components within normal limits  TROPONIN I (HIGH SENSITIVITY) - Abnormal; Notable for the following components:   Troponin I (High Sensitivity) 49 (*)    All other components within normal limits  TROPONIN I (HIGH SENSITIVITY) - Abnormal; Notable for the following components:   Troponin I (High Sensitivity) 51 (*)    All other components within normal limits  CULTURE, BLOOD (ROUTINE X 2)  CULTURE, BLOOD (ROUTINE X 2)  LIPASE, BLOOD  PROCALCITONIN  URINALYSIS, ROUTINE W REFLEX MICROSCOPIC  LACTIC ACID, PLASMA  TYPE AND SCREEN    EKG Atrial fibrillation with a rate of 91 bpm.  Leftward axis.  Right bundle.  No STEMI.  RADIOLOGY CT abdomen/pelvis interpreted by me with evidence of cholecystitis without clear perforation. CXR interpreted by me without evidence of acute cardiopulmonary pathology.  Official radiology report(s): CT ABDOMEN PELVIS WO CONTRAST  Result Date: 06/21/2022 CLINICAL DATA:  Acute nonlocalized abdominal pain. Abdominal pain for 1 day with nausea and vomiting. EXAM: CT ABDOMEN AND PELVIS WITHOUT CONTRAST TECHNIQUE: Multidetector CT imaging of the abdomen and pelvis was performed following the standard protocol without IV contrast. RADIATION DOSE REDUCTION: This exam was performed according to the departmental dose-optimization program which includes automated exposure control, adjustment of the mA and/or kV according to patient size and/or use of iterative reconstruction technique. COMPARISON:   01/10/2021 FINDINGS: Lower chest: Small right pleural effusion with atelectasis or consolidation in the right lung base. Hepatobiliary: No focal liver lesions. Cholelithiasis with stone in the gallbladder. Gallbladder wall thickening and pericholecystic edema. Changes may represent acute cholecystitis in the appropriate clinical setting. No bile duct dilatation. Pancreas: Unremarkable. No pancreatic ductal dilatation or surrounding inflammatory changes. Spleen: Normal in size without focal abnormality. Adrenals/Urinary Tract: No adrenal gland nodules. Kidneys are atrophic. No hydronephrosis or hydroureter. No renal or ureteral stones. Bladder wall is thickened possibly indicating cystitis or outlet obstruction. Stomach/Bowel: Stomach, small bowel, and colon are mostly decompressed. Scattered stool in the colon. No wall thickening or inflammatory changes. Appendix is normal. Vascular/Lymphatic: Aortic atherosclerosis. No enlarged abdominal or pelvic lymph nodes. Reproductive: Prostate gland is enlarged, measuring 5.1 cm diameter. Other: Small amount of free fluid in the pelvis. Free fluid has mildly increased attenuation which may indicate hemorrhagic or proteinaceous fluid. No source is identified. No free air to suggest perforation. Musculoskeletal: Degenerative changes in the spine. No destructive bone lesions. IMPRESSION: 1. Cholelithiasis with gallbladder wall thickening and edema suggesting acute cholecystitis. 2. Small amount of free fluid in the pelvis. Increased density in the fluid may indicate hemorrhagic fluid or proteinaceous fluid. Source is not identified. 3. Prostate gland enlargement. Bladder wall thickening likely due to outlet obstruction but could indicate cystitis. 4. Small right pleural effusion with basilar atelectasis or consolidation. These results were called by telephone at the time of interpretation on 06/23/2022 at 3:10 am to provider [facility] , who verbally acknowledged these  results. Electronically Signed   By: Lucienne Capers M.D.   On: 06/10/2022 03:17   DG Chest 2 View  Result Date: 06/29/2022 CLINICAL DATA:  Chest pain EXAM: CHEST - 2 VIEW COMPARISON:  10/21/2019 FINDINGS: Bibasilar atelectasis.  No pleural effusion or pneumothorax. The heart is top-normal in size. Degenerative changes of the visualized thoracolumbar spine. IMPRESSION: Bibasilar atelectasis. Electronically Signed   By: Julian Hy M.D.   On: 06/21/2022 01:32    PROCEDURES and INTERVENTIONS:  .1-3 Lead EKG Interpretation  Performed by: Vladimir Crofts, MD Authorized by: Vladimir Crofts, MD     Interpretation: normal     ECG rate:  72   ECG rate assessment: normal     Rhythm: sinus rhythm     Ectopy: none     Conduction: normal   .Critical Care  Performed by: Vladimir Crofts, MD Authorized by: Vladimir Crofts, MD   Critical care provider statement:    Critical care time (minutes):  30  Critical care time was exclusive of:  Separately billable procedures and treating other patients   Critical care was necessary to treat or prevent imminent or life-threatening deterioration of the following conditions:  Sepsis and circulatory failure   Critical care was time spent personally by me on the following activities:  Development of treatment plan with patient or surrogate, discussions with consultants, evaluation of patient's response to treatment, examination of patient, ordering and review of laboratory studies, ordering and review of radiographic studies, ordering and performing treatments and interventions, pulse oximetry, re-evaluation of patient's condition and review of old charts   Medications  piperacillin-tazobactam (ZOSYN) IVPB 3.375 g (0 g Intravenous Stopped 06/30/2022 0440)  acetaminophen (OFIRMEV) IV 1,000 mg (0 mg Intravenous Stopped 06/21/2022 0407)  lactated ringers bolus 1,000 mL (0 mLs Intravenous Stopped 06/12/2022 0438)  lactated ringers bolus 1,000 mL (1,000 mLs Intravenous New  Bag/Given 06/13/2022 0526)  albumin human 25 % solution 25 g (25 g Intravenous New Bag/Given 06/15/2022 0755)     IMPRESSION / MDM / [place] / ED COURSE  I reviewed the triage vital signs and the nursing notes.  Differential diagnosis includes, but is not limited to,   {Patient presents with symptoms of an acute illness or injury that is potentially life-threatening.  ESRD patient on Eliquis presents with evidence of cholecystitis requiring medical admission for IR [facility] tube.  He is started on antibiotics.  Noted to be febrile, soft pressures improving with fluids.  Blood work with leukopenia.  Normal lipase.  No signs of hepatobiliary obstruction on his metabolic panel.  Procalcitonin is quite high and his lactic acid is similarly elevated.  Consult with medicine for admission.  Surgery consults  Clinical Course as of 07/10/2022 0830  Fri Jun 22, 2022  0348 I consult with Dr. Dahlia Byes, surgery.  [DS]  6160 He's trying to get images pulled up [DS]  0354 He does not see signs of perforation.  Recommend medical admission, antibiotics for IR tube  [DS]    Clinical Course User Index [DS] Vladimir Crofts, MD     FINAL CLINICAL IMPRESSION(S) / ED DIAGNOSES   Final diagnoses:  Cholecystitis  ESRD (end stage renal disease) on dialysis [facility])     Rx / DC Orders   ED Discharge Orders     None        Note:  This document was prepared using Dragon voice recognition software and may include unintentional dictation errors.   Vladimir Crofts, MD 07/03/2022 0830

## 2022-06-22 NOTE — ED Notes (Signed)
ED Provider at bedside. 

## 2022-06-22 NOTE — Assessment & Plan Note (Signed)
Check blood sugars every 4 hours while NPO Gentle IV fluid hydration with dextrose containing fluids to maintain blood sugars Patient has a history of end-stage renal disease secondary to diabetes and his dialysis days are Tuesday/Thursday/Saturday Nephrology consult for renal replacement therapy

## 2022-06-22 NOTE — Consult Note (Signed)
NAME:  Frank [name], MRN:  [id number], DOB:  Frank [name], LOS: 0 ADMISSION DATE:  06/12/2022, CONSULTATION DATE: 07/07/2022 REFERRING MD: Dr. Francine Graven, CHIEF COMPLAINT: Abdominal Pain  History of Present Illness:  This is an 81 yo male who presented to [facility] ER on 10/13 with a 24 hr hx of RUQ abdominal pain, nausea, and vomiting.  He also endorsed subjective fevers and chills.    ED Course  Upon arrival to the ER lab results revealed K+ 3.2/chloride 94/ glucose 163/creatinine 4.05/calcium 8.7/anion gap 19/troponin 49/lactic acid 2.5/pct 6.51/wbc 3.1/PT 17.3/INR 1.4.  CT Abd/Pelvis concerning for cholelithiasis with gallbladder wall thickening and edema suggesting acute cholecystitis.  Pt received zosyn.  General surgery consulted by ER provider and IR consulted per recommendations for cholecystostomy tube placement.  PT admitted to the stepdown unit per hospitalist team for additional workup and treatment.    Initial ER vital signs: temp 100 F, bp 95/49/hr 88/resp rate 18/O2 sats 97% on RA Initial EKG: Atrial fibrillation with PVC's and RBBB, hr 91 CT Abd Pelvis 10/13: Cholelithiasis with gallbladder wall thickening and edema suggesting acute cholecystitis. Small amount of free fluid in the pelvis. Increased density in the fluid may indicate hemorrhagic fluid or proteinaceous fluid. Source is not identified. Prostate gland enlargement. Bladder wall thickening likely due to outlet obstruction but could indicate cystitis. Small right pleural effusion with basilar atelectasis or consolidation.  CXR 10/13: Bibasilar atelectasis   Despite 2L fluid bolus bp's remained soft PCCM team consulted for possible need of vasopressors.    Pertinent  Medical History  Anemia of CKD Arthritis BPH Bradycardia  Bilateral Cataracts  Type II Diabetes Mellitus Diabetic Nephropathy Erectile Dysfunction Essential HTN Elevated Liver Enzymes  UTI HLD Hypothyroidism [place]ia Illiterate Peripheral Neuropathy   PVD Second Degree Heart Block Stroke  Significant Hospital Events: Including procedures, antibiotic start and stop dates in addition to other pertinent events   10/13: Admitted with sepsis secondary to acute cholecystitis IR consulted for cholecystostomy tube placement.  PCCM team consulted due to soft bp's may require vasopressors   Interim History / Subjective:  Pt currently not on vasopressors bp 90/51 with map of 64  Objective   Blood pressure (!) 84/50, pulse 77, temperature 98.5 F (36.9 C), temperature source Oral, resp. rate 16, height 5\' 8"  (1.727 m), weight 85.7 kg, SpO2 100 %.        Intake/Output Summary (Last 24 hours) at 06/23/2022 0942 Last data filed at 07/08/2022 0440 Gross per 24 hour  Intake 1150 ml  Output --  Net 1150 ml   Filed Weights   06/21/2022 0055  Weight: 85.7 kg    Examination: General: Acute on chronically-ill appearing male, NAD on 3L O2  HENT: Supple, no JVD  Lungs: Clear throughout, even, non labored  Cardiovascular: Irregular irregular, no r/g, 2+ radial/1+ distal pulses  Abdomen: +BS x4, obese, tenderness present Extremities: Moves all extremities; RUE fistula +bruit/thrill Neuro: Alert and oriented, follows commands, PERRLA  GU: Deferred   Resolved Hospital Problem list     Assessment & Plan:   Sepsis secondary to cholelithiasis  - Trend WBC and monitor fever curve  - Trend PCT  - Follow cultures  - Continue zosyn - IR consulted for cholecystostomy tube placement  Hypotension secondary to sepsis  - Continuous telemetry monitoring  - Gentle iv fluid resuscitation and prn levophed gtt to maintain map >60 and/or sbp 90 - Continue midodrine   Mildly elevated troponin likely demand ischemia in the setting of sepsis  Hx: Essential HTN, HLD, PVD, and Stroke  - Trend until troponin peaked  - Continue atorvastatin  - Hold outpatient apixaban for procedure    ESRD on HD  Lactic acidosis  Hypokalemia  - Trend BMP and lactic  acid  - Replace electrolytes as indicated  - Avoid nephrotoxic medications   Type II diabetes mellitus  - CBG's q4hrs - SSI - Target range 140-180  Hypothyroidism - Continue outpatient synthroid  Best Practice (right click and "Reselect all SmartList Selections" daily)   Diet/type: NPO DVT prophylaxis: SCD GI prophylaxis: N/A Lines: N/A Foley:  N/A Code Status:  full code Last date of multidisciplinary goals of care discussion [N/A]  Labs   CBC: Recent Labs  Lab 06/23/2022 0110  WBC 3.1*  HGB 13.0  HCT 43.5  MCV 100.9*  PLT 370    Basic Metabolic Panel: Recent Labs  Lab 07/04/2022 0110  NA 138  K 3.2*  CL 94*  CO2 25  GLUCOSE 163*  BUN 22  CREATININE 4.05*  CALCIUM 8.7*   GFR: Estimated Creatinine Clearance: 15.5 mL/min (A) (by C-G formula based on SCr of 4.05 mg/dL (H)). Recent Labs  Lab 06/17/2022 0110 07/03/2022 0348 06/13/2022 0914  PROCALCITON  --  6.51  --   WBC 3.1*  --   --   LATICACIDVEN  --  2.5* 1.3    Liver Function Tests: Recent Labs  Lab 06/21/2022 0110  AST 23  ALT 9  ALKPHOS 78  BILITOT 1.2  PROT 7.6  ALBUMIN 4.0   Recent Labs  Lab 07/09/2022 0110  LIPASE 38   No results for input(s): "AMMONIA" in the last 168 hours.  ABG    Component Value Date/Time   TCO2 31 10/27/2021 0811     Coagulation Profile: Recent Labs  Lab 07/09/2022 0348  INR 1.4*    Cardiac Enzymes: No results for input(s): "CKTOTAL", "CKMB", "CKMBINDEX", "TROPONINI" in the last 168 hours.  HbA1C: Hgb A1c MFr Bld  Date/Time Value Ref Range Status  10/08/2019 03:16 AM 6.7 (H) 4.8 - 5.6 % Final    Comment:    (NOTE) Pre diabetes:          5.7%-6.4% Diabetes:              >6.4% Glycemic control for   <7.0% adults with diabetes   05/11/2019 10:37 AM 6.6 (H) 4.8 - 5.6 % Final    Comment:             Prediabetes: 5.7 - 6.4          Diabetes: >6.4          Glycemic control for adults with diabetes: <7.0     CBG: Recent Labs  Lab 06/15/2022 0901   GLUCAP 84    Review of Systems: Positives in BOLD  Gen: Denies fever, chills, weight change, fatigue, night sweats HEENT: Denies blurred vision, double vision, hearing loss, tinnitus, sinus congestion, rhinorrhea, sore throat, neck stiffness, dysphagia PULM: Denies shortness of breath, cough, sputum production, hemoptysis, wheezing CV: Denies chest pain, edema, orthopnea, paroxysmal nocturnal dyspnea, palpitations GI: abdominal pain, nausea, vomiting, diarrhea, hematochezia, melena, constipation, change in bowel habits GU: Denies dysuria, hematuria, polyuria, oliguria, urethral discharge Endocrine: Denies hot or cold intolerance, polyuria, polyphagia or appetite change Derm: Denies rash, dry skin, scaling or peeling skin change Heme: Denies easy bruising, bleeding, bleeding gums Neuro: Denies headache, numbness, weakness, slurred speech, loss of memory or consciousness   Past Medical History:  He,  has a  past medical history of Anemia of chronic kidney failure, Arthritis, Benign prostate hyperplasia, Bradycardia, Cataracts, bilateral (05/15/2012), 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   Surgical History:   Past Surgical History:  Procedure Laterality Date   AV FISTULA PLACEMENT Left 11/19/2017   Procedure: ARTERIOVENOUS (AV) FISTULA CREATION;  Surgeon: [name], MD;  [facility];  Service: Vascular;  Laterality:  Left;   AV FISTULA PLACEMENT Right 10/27/2021   Procedure: CREATION OF BRACHIOCEPHALIC RIGHT ARM ARTERIOVENOUS (AV) FISTULA;  Surgeon: [name], MD;  Location: [place];  Service: Vascular;  Laterality: Right;   [place] Left 06/04/2018   Procedure: BASILIC VEIN TRANSPOSITION ARM;  Surgeon: [name], MD;  Location: [place];  Service: Vascular;  Laterality: Left;   COLONOSCOPY     DIALYSIS/PERMA CATHETER REMOVAL N/A 03/23/2019   Procedure: DIALYSIS/PERMA CATHETER REMOVAL;  Surgeon: [name], MD;  Location: [place] [facility];  Service: Cardiovascular;  Laterality: N/A;   EYE SURGERY     cataract surgery b/l; photophobia since    INSERTION OF DIALYSIS CATHETER N/A 01/23/2019   Procedure: INSERTION OF DIALYSIS CATHETER;  Surgeon: [name], MD;  Location: [facility];  Service: Vascular;  Laterality: N/A;  INSERTION OF DIALYSIS CATHETER   INSERTION OF DIALYSIS CATHETER  2022   right subclavian (chest area)   IR ANGIO INTRA EXTRACRAN SEL COM CAROTID INNOMINATE UNI L MOD SED  10/15/2019   IR ANGIO VERTEBRAL SEL SUBCLAVIAN INNOMINATE UNI R MOD SED  10/15/2019   IR CT HEAD LTD  10/15/2019   IR FLUORO GUIDE CV LINE RIGHT  10/08/2019   IR INTRAVSC STENT CERV CAROTID W/EMB-PROT MOD SED INCL ANGIO  10/15/2019   IR THROMBECTOMY AV FISTULA W/THROMBOLYSIS/PTA INC/SHUNT/IMG LEFT Left 10/09/2019   IR US GUIDE VASC ACCESS LEFT  10/09/2019   IR US GUIDE VASC ACCESS RIGHT  10/08/2019   KNEE ARTHROSCOPY Left    Knee   PERIPHERAL VASCULAR THROMBECTOMY Left 07/06/2019   Procedure: PERIPHERAL VASCULAR THROMBECTOMY;  Surgeon: [name], MD;  Location: [place] [facility];  Service: Cardiovascular;  Laterality: Left;   RADIOLOGY WITH ANESTHESIA N/A 10/15/2019   Procedure: stent placement;  Surgeon: Luanne Bras, MD;  Location: [place];  Service: Radiology;  Laterality: N/A;   THROMBECTOMY W/ EMBOLECTOMY Left 01/27/2019   Procedure: THROMBECTOMY Left  arm ARTERIOVENOUS FISTULA  and Left arm Arteriovenous gortex graft;  Surgeon: [name], MD;  Location: [facility] OR;  Service: Vascular;  Laterality: Left;   VENOGRAM Left 01/27/2019   Procedure: Left arm FISTULOGRAM/;  Surgeon: [name], MD;  Location: [place];  Service: Vascular;  Laterality: Left;     Social History:   reports that he quit smoking about 29 years ago. His smoking use included cigarettes. He has never used smokeless tobacco. He reports that he does not drink alcohol and does not use drugs.   Family History:  His family history includes Diabetes in his mother; Heart Problems in his mother; Kidney disease in his mother; Liver disease in his mother. There is no history of Stroke.   Allergies Allergies  Allergen Reactions  Ace Inhibitors Other (See Comments)    Hyperkalemia on ACE INHIBITORS Cr>1.9   Angiotensin Receptor Blockers Other (See Comments)    Hyperkalemia Elevates Cr > 1.9    Ivp Dye [Iodinated Contrast Media] Other (See Comments)    Cannot have due to renal failure   Adhesive [Tape] Other (See Comments)    UNDESCRIBED REACTION Can tolerate ONLY Coban wrap or mild paper tape!!     Home Medications  Prior to Admission medications   Medication Sig Start Date End Date Taking? Authorizing Provider  ALPRAZolam Duanne Moron) 0.25 MG tablet Take 0.25 mg by mouth daily with supper. 01/11/20  Yes [provider]  apixaban (ELIQUIS) 2.5 MG TABS tablet TAKE ONE TABLET BY MOUTH TWICE A DAY. **THIS MEDICINE THINS THE BLOOD** 05/02/22  Yes Shirley Friar, PA-C  Cholecalciferol (VITAMIN D) 50 MCG (2000 UT) tablet Take 2,000 Units by mouth daily. 01/24/21  Yes [provider]  donepezil (ARICEPT) 5 MG tablet Take 1 tablet (5 mg total) by mouth at bedtime. 12/23/19  Yes Melvenia Beam, MD  lidocaine-prilocaine (EMLA) cream Apply 1 application topically See admin instructions. Apply to access site 1 to 2 hours before dialysis. Cover with occlusive  dressing.On Tuesday,Thursday and Saturday 04/08/19  Yes [provider]  Melatonin 10 MG TABS Take 10 mg by mouth at bedtime.   Yes [provider]  midodrine (PROAMATINE) 10 MG tablet Take 1 tablet (10 mg total) by mouth 3 (three) times daily with meals. Patient taking differently: Take 10 mg by mouth 3 (three) times daily with meals. Use as directed 12/14/19  Yes Tillery, Satira Mccallum, PA-C  midodrine (PROAMATINE) 5 MG tablet 5 mg. Take 5 mg by mouth at 10:30 and 1 pm on Dialysis days   Yes [provider]  neomycin-bacitracin-polymyxin (NEOSPORIN) [phone] ointment Apply 1 Application topically 2 (two) times a week.   Yes [provider]  pantoprazole (PROTONIX) 40 MG tablet Take 1 tablet (40 mg total) by mouth daily. Patient taking differently: Take 40 mg by mouth daily before breakfast. 10/23/19 06/24/2022 Yes Regalado, Belkys A, MD  sevelamer carbonate (RENVELA) 800 MG tablet Take 3 tablets (2,400 mg total) by mouth 3 (three) times daily with meals. Patient taking differently: Take 2,400 mg by mouth 3 (three) times daily with meals. Use as directed 10/23/19  Yes Regalado, Belkys A, MD  Skin Protectants, Misc. (MINERIN CREME EX) Apply 1 application topically daily. Apply to both arms and legs   Yes [provider]  tamsulosin (FLOMAX) 0.4 MG CAPS capsule Take 1 capsule (0.4 mg total) by mouth daily. Patient taking differently: Take 0.8 mg by mouth daily. 10/23/19  Yes Regalado, Belkys A, MD  tiZANidine (ZANAFLEX) 2 MG tablet Take 2 mg by mouth at bedtime. 03/27/21  Yes [provider]  traZODone (DESYREL) 50 MG tablet Take 50 mg by mouth at bedtime.   Yes [provider]  zinc oxide (BALMEX) 11.3 % CREA cream Apply 1 application topically See admin instructions. Apply to bottom and groin area 3 times daily and as needed for incontinence care   Yes [provider]  acetaminophen (TYLENOL) 325 MG tablet Take 650 mg by mouth every 6  (six) hours as needed for moderate pain.    [provider]  albuterol (VENTOLIN HFA) 108 (90 Base) MCG/ACT inhaler Inhale 2 puffs into the lungs every 6 (six) hours as needed for shortness of breath. 12/20/20   [provider]  allopurinol (ZYLOPRIM) 100 MG tablet Take  1 tablet (100 mg total) by mouth daily. 06/03/19   McLean-Scocuzza, Nino Glow, MD  ALPRAZolam Duanne Moron) 0.5 MG tablet Take 0.25 mg by mouth Every Tuesday,Thursday,and Saturday with dialysis. 09/21/21   [provider]  aspirin 81 MG chewable tablet Chew 81 mg by mouth daily.    [provider]  atorvastatin (LIPITOR) 20 MG tablet Take 20 mg by mouth daily.    [provider]  benzonatate (TESSALON) 200 MG capsule Take 200 mg by mouth 3 (three) times daily as needed for cough. Patient not taking: Reported on 06/26/2022    [provider]  bisacodyl (DULCOLAX) 10 MG suppository Place 10 mg rectally as needed for moderate constipation. Patient not taking: Reported on 07/05/2022    [provider]  Camphor-Menthol (FREEZE IT EX) Apply 1 application topically every 6 (six) hours as needed (back and neck pain).    [provider]  docusate sodium (COLACE) 100 MG capsule Take 200 mg by mouth daily as needed for moderate constipation. Patient not taking: Reported on 07/09/2022    [provider]  guaiFENesin (MUCINEX) 600 MG 12 hr tablet Take 600 mg by mouth every 12 (twelve) hours as needed (congestion).    [provider]  levothyroxine (SYNTHROID) 150 MCG tablet Take 150 mcg by mouth daily. 05/21/22   [provider]  ondansetron (ZOFRAN) 4 MG tablet Take 1 tablet (4 mg total) by mouth every 8 (eight) hours as needed. 11/06/20   Nance Pear, MD  senna (SENOKOT) 8.6 MG TABS tablet Take 2 tablets by mouth at bedtime as needed for moderate constipation.    [provider]  umeclidinium-vilanterol (ANORO ELLIPTA) 62.5-25 MCG/ACT AEPB Inhale  1 puff into the lungs daily.    [provider]     Critical care time: 24 minutes      Donell Beers, [place] Pager [phone] (please enter 7 digits) PCCM Consult Pager [phone] (please enter 7 digits)

## 2022-06-22 NOTE — ED Triage Notes (Signed)
Abd pain x 1 day with n/v earlier. Given zofran at facility and denies n/v at this time. Pt alert and following commands. Reports abd pain radiates into chest. EKG obtained.

## 2022-06-22 NOTE — Consult Note (Addendum)
Pharmacy Antibiotic Note  Frank [name] is a 81 y.o. male admitted on 06/26/2022 with  intra-abdominal infection . Pharmacy has been consulted for Zosyn dosing.  Assessment: 81 yo M with PMH ESRD-HD (TTS), anemia of CKD, hypothyroidism, depression, CVA, Afib (on Eliquis) presenting with abdominal pain. CTAP shows cholelithiasis with gallbladder wall thickening and edema suggesting acute cholecystitis. Pt is febrile (Tmax 103), tachypneic, hypotensive, lactic acid 2.5, with low WBC of 3.1. Pt received one-time doses of Zosyn 3.375 g and metronidazole 500 mg in the ED. The one-time dose of metronidazole was because the initial antibiotic plan was cefepime + metronidazole before treatment team changed plan to Zosyn monotherapy. Pt will be admitted to CCU. COVID-19 PCR and Bcx have been collected.  Plan: Initiate Zosyn 2.25 g IV q8H Monitor for signs & symptoms of worsening infection to assess for antibiotic efficacy Follow up culture results to assess for opportunities to narrow therapy  Height: 5\' 8"  (172.7 cm) Weight: 85.7 kg (189 lb) IBW/kg (Calculated) : 68.4  Temp (24hrs), Avg:100.5 F (38.1 C), Min:98.5 F (36.9 C), Max:103 F (39.4 C)  Recent Labs  Lab 06/21/2022 0110 06/20/2022 0348 06/11/2022 0914  WBC 3.1*  --   --   CREATININE 4.05*  --   --   LATICACIDVEN  --  2.5* 1.3    Estimated Creatinine Clearance: 15.5 mL/min (A) (by C-G formula based on SCr of 4.05 mg/dL (H)).    Allergies  Allergen Reactions   Ace Inhibitors Other (See Comments)    Hyperkalemia on ACE INHIBITORS Cr>1.9   Angiotensin Receptor Blockers Other (See Comments)    Hyperkalemia Elevates Cr > 1.9    Ivp Dye [Iodinated Contrast Media] Other (See Comments)    Cannot have due to renal failure   Adhesive [Tape] Other (See Comments)    UNDESCRIBED REACTION Can tolerate ONLY Coban wrap or mild paper tape!!    Antimicrobials this admission: Zosyn 10/13 >>  Metronidazole 10/13 x 1 (given because initial  plan was for cefepime + Flagyl before plan was switched to Zosyn monotherapy)  Dose adjustments this admission: N/A  Microbiology results: 10/13 BCx: collected 10/13 COVID19 PCR: collected  Thank you for allowing pharmacy to be a part of this patient's care.  Dara Hoyer, PharmD PGY-1 Pharmacy Resident 06/25/2022 10:54 AM

## 2022-06-22 NOTE — Assessment & Plan Note (Signed)
History of chronic A-fib on anticoagulation with apixaban as secondary prophylaxis for an acute stroke. Hold apixaban for planned procedure

## 2022-06-22 NOTE — H&P (Addendum)
History and Physical    Patient: Frank [name] JTT:[id number] DOB: 07-18-1941 DOA: 07/05/2022 DOS: the patient was seen and examined on 07/03/2022 PCP: Verl Blalock, NP  Patient coming from: [facility] assisted living  Chief Complaint:  Chief Complaint  Patient presents with   Abdominal Pain   HPI: Frank [name] is a 81 y.o. 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 Abdominal pain is mostly in the epigastrium and right upper quadrant.  Rated a 6 x 7 over 10 in intensity at its worst.  Associated with nausea and vomiting.  Patient had a normal bowel movement 1 day prior to his admission.  He has had chills and subjective fevers. He denies having any chest pain, no dizziness, no lightheadedness, no palpitations, no diaphoresis, no leg swelling, no blurred vision no focal deficit. Upon arrival to the ER he was febrile with a Tmax of 103, lactic acid was elevated at 2.5 and white count of 3.1. Patient remains hypotensive with systolic blood pressure in the 80s and map of 60 and has received 2 L of IV LR as well as albumin.  He is awake and alert. He had a CAT scan of the abdomen and pelvis which showed cholelithiasis with gallbladder wall thickening and edema suggesting acute cholecystitis. Surgery was consulted by ER physician who recommended admission to medicine with consult to interventional radiology for cholecystostomy tube placement.     Review of Systems: As mentioned in the history of present illness. All other systems reviewed and are negative. Past Medical History:  Diagnosis Date   Anemia of  chronic kidney failure    Arthritis    Benign prostate hyperplasia    Bradycardia    Cataracts, bilateral 05/15/2012   Chronic idiopathic gout of multiple sites 02/10/2015   Chronic kidney disease    [facility]- ESRD   Depression    Diabetes mellitus without complication ([place])    diet controlled    Diabetic nephropathy ([place])    ED (erectile dysfunction) 08/28/2012   Elevated liver enzymes 06/10/2019   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 HTN PLAN   BP goal is <140/90 BP Readings from Last 3 Encounters:  08/27/16 140/79  08/13/16 141/80  08/06/16 144/86   Current Anti-Hyptensive Medications: None  Today's Recommendations: Continue lifestyle modifications. Defer starting medication to PCP.  Recommend continued regular exercise as tolerated. Recomm   History of UTI    s/p foley in hospital 01/2019    Hyperlipidemia    Hypothyroidism    Illiterate 11/19/2013   Last Assessment & Plan:  Assisted patient with Handicapped Placard application today.     Insomnia 05/08/2019   Neuropathy    Peripheral neuropathy    Peripheral vascular disease ([place])    Second degree heart block    Secondary hyperparathyroidism ([place])    Renal   Stroke ([place]) 2021   Thyroid disease    Past Surgical History:  Procedure Laterality Date   AV FISTULA PLACEMENT Left 11/19/2017   Procedure: ARTERIOVENOUS (AV) FISTULA CREATION;  Surgeon: [name], MD;  Location: [place];  Service: Vascular;  Laterality: Left;   AV FISTULA PLACEMENT Right 10/27/2021   Procedure: CREATION OF BRACHIOCEPHALIC RIGHT ARM ARTERIOVENOUS (AV) FISTULA;  Surgeon: [name], MD;  Location: [place];  Service: Vascular;  Laterality: Right;   [place] Left 06/04/2018   Procedure: BASILIC VEIN TRANSPOSITION ARM;  Surgeon: [name], MD;  Location: [place];  Service: Vascular;  Laterality: Left;   COLONOSCOPY     DIALYSIS/PERMA CATHETER REMOVAL N/A 03/23/2019   Procedure: DIALYSIS/PERMA CATHETER REMOVAL;  Surgeon: [name], MD;  Location: [place] [facility];  Service: Cardiovascular;  Laterality: N/A;   EYE SURGERY     cataract surgery b/l; photophobia since    INSERTION OF DIALYSIS CATHETER N/A 01/23/2019   Procedure: INSERTION OF DIALYSIS CATHETER;  Surgeon: [name], MD;  Location: [facility];  Service: Vascular;  Laterality: N/A;  INSERTION OF DIALYSIS CATHETER   INSERTION OF DIALYSIS CATHETER  2022   right subclavian (chest area)   IR ANGIO INTRA EXTRACRAN SEL COM CAROTID INNOMINATE UNI L MOD SED  10/15/2019   IR ANGIO VERTEBRAL SEL SUBCLAVIAN INNOMINATE UNI R MOD SED  10/15/2019   IR CT HEAD LTD  10/15/2019   IR FLUORO GUIDE CV LINE RIGHT  10/08/2019   IR INTRAVSC STENT CERV CAROTID W/EMB-PROT MOD SED INCL ANGIO  10/15/2019   IR THROMBECTOMY AV FISTULA W/THROMBOLYSIS/PTA INC/SHUNT/IMG LEFT Left 10/09/2019   IR US GUIDE VASC ACCESS LEFT  10/09/2019   IR US GUIDE VASC ACCESS RIGHT  10/08/2019   KNEE ARTHROSCOPY Left    Knee   PERIPHERAL VASCULAR THROMBECTOMY Left 07/06/2019   Procedure: PERIPHERAL VASCULAR THROMBECTOMY;  Surgeon: [name], MD;  Location: [place] [facility];  Service: Cardiovascular;  Laterality: Left;   RADIOLOGY WITH ANESTHESIA N/A 10/15/2019   Procedure: stent placement;  Surgeon: Luanne Bras, MD;  Location: [place];  Service: Radiology;  Laterality: N/A;   THROMBECTOMY W/ EMBOLECTOMY Left 01/27/2019   Procedure: THROMBECTOMY Left arm ARTERIOVENOUS FISTULA  and Left arm Arteriovenous gortex graft;  Surgeon: [name], MD;  Location: [facility];  Service: Vascular;  Laterality: Left;   VENOGRAM Left 01/27/2019   Procedure: Left arm FISTULOGRAM/;  Surgeon: [name], MD;  Location: [place];  Service: Vascular;  Laterality: Left;   Social History:   reports that he quit smoking about 29 years ago. His smoking use included cigarettes. He has never used smokeless tobacco. He reports that he does not drink alcohol and does not use drugs.  Allergies  Allergen Reactions   Ace Inhibitors Other (See Comments)    Hyperkalemia on ACE INHIBITORS Cr>1.9   Angiotensin Receptor Blockers Other (See Comments)    Hyperkalemia Elevates Cr > 1.9    Ivp Dye [Iodinated Contrast Media] Other (See Comments)    Cannot have due to renal failure   Adhesive [Tape] Other (See Comments)    UNDESCRIBED REACTION Can tolerate ONLY Coban wrap or mild paper tape!!    Family History  Problem Relation Age of Onset   Diabetes Mother    Kidney disease Mother    Liver disease Mother    Heart Problems Mother    Stroke Neg Hx     Prior to Admission medications   Medication Sig Start Date End Date Taking? Authorizing Provider  ALPRAZolam Duanne Moron) 0.25 MG tablet Take 0.25 mg by mouth daily with supper. 01/11/20  Yes [provider]  apixaban (ELIQUIS) 2.5 MG TABS tablet TAKE ONE TABLET BY MOUTH TWICE A DAY. **THIS MEDICINE THINS THE BLOOD** 05/02/22  Yes Shirley Friar, PA-C  Cholecalciferol (VITAMIN D) 50 MCG (2000 UT) tablet Take 2,000 Units by mouth daily. 01/24/21  Yes [provider]  donepezil (ARICEPT) 5 MG tablet Take 1 tablet (5 mg total) by mouth at bedtime. 12/23/19  Yes Melvenia Beam, MD  lidocaine-prilocaine (EMLA) cream Apply 1 application topically See admin instructions. Apply to access site 1 to 2 hours before dialysis. Cover with occlusive dressing.On Tuesday,Thursday and Saturday 04/08/19  Yes [provider]  Melatonin 10 MG TABS Take 10 mg by mouth at bedtime.   Yes [provider]  midodrine (PROAMATINE) 10 MG tablet Take 1 tablet (10 mg total) by mouth 3 (three) times daily with meals. Patient taking differently: Take 10 mg by mouth 3 (three) times daily with meals. Use as directed 12/14/19  Yes Tillery,  Satira Mccallum, PA-C  midodrine (PROAMATINE) 5 MG tablet 5 mg. Take 5 mg by mouth at 10:30 and 1 pm on Dialysis days   Yes [provider]  neomycin-bacitracin-polymyxin (NEOSPORIN) [phone] ointment Apply 1 Application topically 2 (two) times a week.   Yes [provider]  pantoprazole (PROTONIX) 40 MG tablet Take 1 tablet (40 mg total) by mouth daily. Patient taking differently: Take 40 mg by mouth daily before breakfast. 10/23/19 06/18/2022 Yes Regalado, Belkys A, MD  sevelamer carbonate (RENVELA) 800 MG tablet Take 3 tablets (2,400 mg total) by mouth 3 (three) times daily with meals. Patient taking differently: Take 2,400 mg by mouth 3 (three) times daily with meals. Use as directed 10/23/19  Yes Regalado, Belkys A, MD  Skin Protectants, Misc. (MINERIN CREME EX) Apply 1 application topically daily. Apply to both arms and legs   Yes [provider]  tamsulosin (FLOMAX) 0.4 MG CAPS capsule Take 1 capsule (0.4 mg total) by mouth daily. Patient taking differently: Take 0.8 mg by mouth daily. 10/23/19  Yes Regalado, Belkys A, MD  tiZANidine (ZANAFLEX) 2 MG tablet Take 2 mg by mouth at bedtime. 03/27/21  Yes [provider]  traZODone (DESYREL) 50 MG tablet Take 50 mg by mouth at bedtime.   Yes [provider]  zinc oxide (BALMEX) 11.3 % CREA cream Apply 1 application topically See admin instructions. Apply to bottom and groin area 3 times daily and as needed for incontinence care   Yes [provider]  acetaminophen (TYLENOL) 325 MG tablet Take 650 mg by mouth every 6 (six) hours as needed for moderate pain.    [provider]  albuterol (VENTOLIN HFA) 108 (90 Base) MCG/ACT inhaler Inhale 2 puffs into the lungs every 6 (six) hours as needed for shortness of breath. 12/20/20   [provider]  allopurinol (ZYLOPRIM) 100 MG tablet Take 1 tablet (100 mg total) by mouth daily. 06/03/19   McLean-Scocuzza, Nino Glow, MD  ALPRAZolam Duanne Moron) 0.5  MG tablet Take 0.25 mg by mouth Every Tuesday,Thursday,and Saturday with dialysis. 09/21/21   [provider]  aspirin 81 MG chewable tablet Chew 81 mg by mouth daily.    [provider]  atorvastatin (LIPITOR) 20 MG tablet Take 20 mg by mouth daily.    [provider]  benzonatate (TESSALON) 200 MG capsule Take 200 mg by mouth 3 (three) times daily as needed for cough. Patient not taking: Reported on 06/25/2022    [provider]  bisacodyl (DULCOLAX) 10 MG suppository Place 10 mg  rectally as needed for moderate constipation. Patient not taking: Reported on 06/21/2022    [provider]  Camphor-Menthol (FREEZE IT EX) Apply 1 application topically every 6 (six) hours as needed (back and neck pain).    [provider]  docusate sodium (COLACE) 100 MG capsule Take 200 mg by mouth daily as needed for moderate constipation. Patient not taking: Reported on 07/02/2022    [provider]  guaiFENesin (MUCINEX) 600 MG 12 hr tablet Take 600 mg by mouth every 12 (twelve) hours as needed (congestion).    [provider]  levothyroxine (SYNTHROID) 150 MCG tablet Take 150 mcg by mouth daily. 05/21/22   [provider]  ondansetron (ZOFRAN) 4 MG tablet Take 1 tablet (4 mg total) by mouth every 8 (eight) hours as needed. 11/06/20   Nance Pear, MD  senna (SENOKOT) 8.6 MG TABS tablet Take 2 tablets by mouth at bedtime as needed for moderate constipation.    [provider]  umeclidinium-vilanterol (ANORO ELLIPTA) 62.5-25 MCG/ACT AEPB Inhale 1 puff into the lungs daily.    [provider]    Physical Exam: Vitals:   06/25/2022 0913 06/30/2022 0915 06/14/2022 0930 06/13/2022 1000  BP:  (!) 82/48 (!) 84/50 (!) 86/45  Pulse: 79 86 77 76  Resp: (!) 23 (!) 25 16 (!) 21  Temp: 98.5 F (36.9 C)     TempSrc: Oral     SpO2: 100% 100% 100% 100%  Weight:      Height:       Physical Exam Vitals and nursing note reviewed.   Constitutional:      Comments: Chronically ill-appearing  HENT:     Head: Normocephalic and atraumatic.  Eyes:     Comments: Pale conjunctiva  Cardiovascular:     Rate and Rhythm: Normal rate and regular rhythm.  Pulmonary:     Effort: Pulmonary effort is normal.     Breath sounds: Normal breath sounds.  Abdominal:     General: Abdomen is flat. Bowel sounds are normal.     Palpations: Abdomen is soft.     Comments: Diffusely tender but more in the right upper quadrant  Skin:    General: Skin is warm and dry.  Neurological:     General: No focal deficit present.     Mental Status: He is alert.  Psychiatric:        Mood and Affect: Mood normal.        Behavior: Behavior normal.     Data Reviewed: Relevant notes from primary care and specialist visits, past discharge summaries as available in EHR, including Care Everywhere. Prior diagnostic testing as pertinent to current admission diagnoses Updated medications and problem lists for reconciliation ED course, including vitals, labs, imaging, treatment and response to treatment Triage notes, nursing and pharmacy notes and ED provider's notes Notable results as noted in HPI Labs reviewed.  Lactic acid 2.5 >> 1.3, procalcitonin 6.51, troponin 51, PT 17.3, INR 1.4, lipase 38, sodium 138, potassium 3.2, chloride 94, bicarb 25, glucose 163, BUN 22, creatinine 4.05, calcium 8.7, total protein 7.6, albumin 4.0, AST 23, ALT 9, alkaline phosphatase 78, total bilirubin 1.2, white count 3.1, hemoglobin 13.0, hematocrit 43.5, platelet count 153 Chest x-ray reviewed by me shows bibasilar atelectasis CT scan of abdomen and pelvis showed  cholelithiasis with gallbladder wall thickening and edema suggesting acute cholecystitis. Small amount of free fluid in the pelvis. Increased density in the fluid may indicate hemorrhagic fluid or proteinaceous fluid. Source is not  identified. Prostate gland enlargement. Bladder wall thickening likely due to  outlet obstruction but could indicate cystitis. Small right pleural effusion with basilar atelectasis or consolidation. Twelve-lead EKG reviewed by me shows A-fib with PVCs, left axis deviation and a right bundle branch block There are no new results to review at this time.  Assessment and Plan: * Sepsis ([place]) As evidenced by fever with a Tmax of 103, hypotension, tachypnea, lactic acidosis and elevated procalcitonin levels. Source of sepsis appears to be acute cholecystitis Patient has a history of end-stage renal disease and is on hemodialysis.   He received a total of 2.2 L fluid bolus and albumin and remains hypotensive with systolic blood pressure in the 80s and a MAP of 60. He may likely require IV pressors and will consult critical care . Chart review also shows that patient takes midodrine at home and may have chronic hypotension. Place patient empirically on antibiotic therapy with Zosyn Follow-up results of blood cultures  Acute cholecystitis Patient presents for evaluation of abdominal pain mostly in the right upper quadrant associated with nausea and vomiting. Imaging is consistent with acute cholecystitis Patient has been seen in consultation by surgery who recommend percutaneous cholecystostomy tube placement to allow for optimization for interval cholecystectomy in 6 to 8 weeks. Continue IV antibiotic therapy with Zosyn  Atrial fibrillation, chronic (HCC) History of chronic A-fib on anticoagulation with apixaban as secondary prophylaxis for an acute stroke. Hold apixaban for planned procedure  Coronary artery disease involving native coronary artery of native heart without angina pectoris Hold aspirin for planned procedure Continue atorvastatin  Elevated troponin Noted to have mildly elevated troponin which is flat Probably related to history of renal disease as well as demand ischemia from sepsis  Hypothyroidism Stable Continue Synthroid  Diabetes mellitus with  ESRD (end-stage renal disease) (HCC) Check blood sugars every 4 hours while NPO Gentle IV fluid hydration with dextrose containing fluids to maintain blood sugars Patient has a history of end-stage renal disease secondary to diabetes and his dialysis days are Tuesday/Thursday/Saturday Nephrology consult for renal replacement therapy      Advance Care Planning:   Code Status: Full Code   Consults: Nephrology, critical care  Family Communication: Greater than 50% of time was spent discussing patient's condition and plan of care with his daughter over the phone ([place]).  All questions and concerns have been addressed.  She verbalizes understanding and agrees with the plan.  CODE STATUS was discussed and patient is a full code.  Severity of Illness: The appropriate patient status for this patient is INPATIENT. Inpatient status is judged to be reasonable and necessary in order to provide the required intensity of service to ensure the patient's safety. The patient's presenting symptoms, physical exam findings, and initial radiographic and laboratory data in the context of their chronic comorbidities is felt to place them at high risk for further clinical deterioration. Furthermore, it is not anticipated that the patient will be medically stable for discharge from the hospital within 2 midnights of admission.   * I certify that at the point of admission it is my clinical judgment that the patient will require inpatient hospital care spanning beyond 2 midnights from the point of admission due to high intensity of service, high risk for further deterioration and high frequency of surveillance required.*  Author: Collier Bullock, MD 07/02/2022 10:44 AM  For on call review [email or web address].

## 2022-06-22 NOTE — Procedures (Signed)
  Procedure:  Perc cholecystostomy catheter placement 33f Preprocedure diagnosis: The primary encounter diagnosis was Cholecystitis. A diagnosis of ESRD (end stage renal disease) on dialysis [facility]) was also pertinent to this visit.  Postprocedure diagnosis: same EBL:    minimal Complications:   none immediate  See full dictation in [employer].  Dillard Cannon MD Main # [phone] Pager  [phone] Mobile [phone]

## 2022-06-23 ENCOUNTER — Inpatient Hospital Stay: Payer: Medicare Other

## 2022-06-23 DIAGNOSIS — A419 Sepsis, unspecified organism: Secondary | ICD-10-CM | POA: Diagnosis not present

## 2022-06-23 DIAGNOSIS — R6520 Severe sepsis without septic shock: Secondary | ICD-10-CM | POA: Diagnosis not present

## 2022-06-23 LAB — MAGNESIUM: Magnesium: 1.8 mg/dL (ref 1.7–2.4)

## 2022-06-23 LAB — BASIC METABOLIC PANEL
Anion gap: 13 (ref 5–15)
BUN: 37 mg/dL — ABNORMAL HIGH (ref 8–23)
CO2: 27 mmol/L (ref 22–32)
Calcium: 7.8 mg/dL — ABNORMAL LOW (ref 8.9–10.3)
Chloride: 97 mmol/L — ABNORMAL LOW (ref 98–111)
Creatinine, Ser: 5.18 mg/dL — ABNORMAL HIGH (ref 0.61–1.24)
GFR, Estimated: 11 mL/min — ABNORMAL LOW (ref >60)
Glucose, Bld: 159 mg/dL — ABNORMAL HIGH (ref 70–99)
Potassium: 3.9 mmol/L (ref 3.5–5.1)
Sodium: 137 mmol/L (ref 135–145)

## 2022-06-23 LAB — GLUCOSE, CAPILLARY
Glucose-Capillary: 99 mg/dL (ref 70–99)
Glucose-Capillary: 129 mg/dL — ABNORMAL HIGH (ref 70–99)
Glucose-Capillary: 116 mg/dL — ABNORMAL HIGH (ref 70–99)
Glucose-Capillary: 143 mg/dL — ABNORMAL HIGH (ref 70–99)
Glucose-Capillary: 103 mg/dL — ABNORMAL HIGH (ref 70–99)
Glucose-Capillary: 95 mg/dL (ref 70–99)

## 2022-06-23 LAB — PROCALCITONIN: Procalcitonin: 35.56 ng/mL

## 2022-06-23 LAB — CBC
HCT: 33.9 % — ABNORMAL LOW (ref 39.0–52.0)
Hemoglobin: 10.3 g/dL — ABNORMAL LOW (ref 13.0–17.0)
MCH: 30.3 pg (ref 26.0–34.0)
MCHC: 30.4 g/dL (ref 30.0–36.0)
MCV: 99.7 fL (ref 80.0–100.0)
Platelets: 123 10*3/uL — ABNORMAL LOW (ref 150–400)
RBC: 3.4 MIL/uL — ABNORMAL LOW (ref 4.22–5.81)
RDW: 13.5 % (ref 11.5–15.5)
WBC: 12.4 10*3/uL — ABNORMAL HIGH (ref 4.0–10.5)
nRBC: 0 % (ref 0.0–0.2)

## 2022-06-23 LAB — PHOSPHORUS: Phosphorus: 3.9 mg/dL (ref 2.5–4.6)

## 2022-06-23 LAB — HEPATITIS B SURFACE ANTIBODY,QUALITATIVE: Hep B S Ab: REACTIVE — AB

## 2022-06-23 LAB — HEPATITIS C ANTIBODY: HCV Ab: NONREACTIVE

## 2022-06-23 LAB — CORTISOL-AM, BLOOD: Cortisol - AM: 35 ug/dL — ABNORMAL HIGH (ref 6.7–22.6)

## 2022-06-23 LAB — HEPATITIS B CORE ANTIBODY, TOTAL: Hep B Core Total Ab: NONREACTIVE

## 2022-06-23 LAB — PROTIME-INR
INR: 1.9 — ABNORMAL HIGH (ref 0.8–1.2)
Prothrombin Time: 21.5 seconds — ABNORMAL HIGH (ref 11.4–15.2)

## 2022-06-23 LAB — HEPATITIS B SURFACE ANTIGEN: Hepatitis B Surface Ag: NONREACTIVE

## 2022-06-23 MED ORDER — LACTATED RINGERS IV SOLN: INTRAVENOUS | Status: DC

## 2022-06-23 MED ORDER — MAGNESIUM SULFATE 2 GM/50ML IV SOLN: 2.0000 g | Freq: Once | INTRAVENOUS | Status: DC

## 2022-06-23 MED ORDER — MAGNESIUM SULFATE IN D5W 1-5 GM/100ML-% IV SOLN
1.0000 g | INTRAVENOUS | Status: AC
  Administered 2022-06-23 (×2): 1 g via INTRAVENOUS
  Filled 2022-06-23 (×2): qty 100

## 2022-06-23 MED ORDER — CHLORHEXIDINE GLUCONATE CLOTH 2 % EX PADS
6.0000 | MEDICATED_PAD | Freq: Every day | CUTANEOUS | Status: DC
  Administered 2022-06-24 – 2022-06-25 (×2): 6 via TOPICAL

## 2022-06-23 NOTE — Progress Notes (Signed)
NAME:  Frank [name], MRN:  [id number], DOB:  [birth year], LOS: 1 ADMISSION DATE:  07/05/2022, CONSULTATION DATE: 06/29/2022 REFERRING MD: Dr. Francine Graven, CHIEF COMPLAINT: Abdominal Pain  History of Present Illness:  This is an 81 yo male who presented to [facility] ER on 10/13 with a 24 hr hx of RUQ abdominal pain, nausea, and vomiting.  He also endorsed subjective fevers and chills.    ED Course  Upon arrival to the ER lab results revealed K+ 3.2/chloride 94/ glucose 163/creatinine 4.05/calcium 8.7/anion gap 19/troponin 49/lactic acid 2.5/pct 6.51/wbc 3.1/PT 17.3/INR 1.4.  CT Abd/Pelvis concerning for cholelithiasis with gallbladder wall thickening and edema suggesting acute cholecystitis.  Pt received zosyn.  General surgery consulted by ER provider and IR consulted per recommendations for cholecystostomy tube placement.  PT admitted to the stepdown unit per hospitalist team for additional workup and treatment.    Initial ER vital signs: temp 100 F, bp 95/49/hr 88/resp rate 18/O2 sats 97% on RA Initial EKG: Atrial fibrillation with PVC's and RBBB, hr 91 CT Abd Pelvis 10/13: Cholelithiasis with gallbladder wall thickening and edema suggesting acute cholecystitis. Small amount of free fluid in the pelvis. Increased density in the fluid may indicate hemorrhagic fluid or proteinaceous fluid. Source is not identified. Prostate gland enlargement. Bladder wall thickening likely due to outlet obstruction but could indicate cystitis. Small right pleural effusion with basilar atelectasis or consolidation.  CXR 10/13: Bibasilar atelectasis   Despite 2L fluid bolus bp's remained soft PCCM team consulted for possible need of vasopressors.    06/23/22- patient seen and examined at bedside.  He remains critically ill on levophed with chronic aphasic speech post previous stroke.  He still has active return form GB drain but seems clinically improved. He reports ongoing RUQ pain and tenderness.   He is for HD today.     Pertinent  Medical History  Anemia of CKD Arthritis BPH Bradycardia  Bilateral Cataracts  Type II Diabetes Mellitus Diabetic Nephropathy Erectile Dysfunction Essential HTN Elevated Liver Enzymes  UTI HLD Hypothyroidism Insominia Illiterate Peripheral Neuropathy  PVD Second Degree Heart Block Stroke  Significant Hospital Events: Including procedures, antibiotic start and stop dates in addition to other pertinent events   10/13: Admitted with sepsis secondary to acute cholecystitis IR consulted for cholecystostomy tube placement.  PCCM team consulted due to soft bp's may require vasopressors   Interim History / Subjective:  Pt currently not on vasopressors bp 90/51 with map of 64  Objective   Blood pressure (!) 103/52, pulse 71, temperature 98.2 F (36.8 C), temperature source Oral, resp. rate (!) 31, height 5\' 8"  (1.727 m), weight 85.9 kg, SpO2 100 %.        Intake/Output Summary (Last 24 hours) at 06/23/2022 1708 Last data filed at 06/23/2022 1600 Gross per 24 hour  Intake 724 ml  Output 90 ml  Net 634 ml    Filed Weights   06/23/2022 0055 06/17/2022 1252  Weight: 85.7 kg 85.9 kg    Examination: General: Acute on chronically-ill appearing male, NAD on 3L O2  HENT: Supple, no JVD  Lungs: Clear throughout, even, non labored  Cardiovascular: Irregular irregular, no r/g, 2+ radial/1+ distal pulses  Abdomen: +BS x4, obese, tenderness present Extremities: Moves all extremities; RUE fistula +bruit/thrill Neuro: Alert and oriented, follows commands, PERRLA  GU: Deferred   Resolved Hospital Problem list     Assessment & Plan:   Sepsis secondary to cholelithiasis  - Trend WBC and monitor fever curve  - Trend PCT  -  Follow cultures  - Continue zosyn - IR consulted for cholecystostomy tube placement  Hypotension secondary to sepsis  - Continuous telemetry monitoring  - Gentle iv fluid resuscitation and prn levophed gtt to maintain map >60 and/or sbp 90 -  Continue midodrine   Mildly elevated troponin likely demand ischemia in the setting of sepsis  Hx: Essential HTN, HLD, PVD, and Stroke  - Trend until troponin peaked  - Continue atorvastatin  - Hold outpatient apixaban for procedure    ESRD on HD  Lactic acidosis  Hypokalemia  - Trend BMP and lactic acid  - Replace electrolytes as indicated  - Avoid nephrotoxic medications   Type II diabetes mellitus  - CBG's q4hrs - SSI - Target range 140-180  Hypothyroidism - Continue outpatient synthroid  Best Practice (right click and "Reselect all SmartList Selections" daily)   Diet/type: NPO DVT prophylaxis: SCD GI prophylaxis: N/A Lines: N/A Foley:  N/A Code Status:  full code Last date of multidisciplinary goals of care discussion [N/A]  Labs   CBC: Recent Labs  Lab 07/09/2022 0110 06/23/22 0507  WBC 3.1* 12.4*  HGB 13.0 10.3*  HCT 43.5 33.9*  MCV 100.9* 99.7  PLT 153 123*     Basic Metabolic Panel: Recent Labs  Lab 07/09/2022 0110 06/10/2022 2012 06/23/22 0507  NA 138 136 137  K 3.2* 4.0 3.9  CL 94* 97* 97*  CO2 25 28 27   GLUCOSE 163* 121* 159*  BUN 22 33* 37*  CREATININE 4.05* 5.11* 5.18*  CALCIUM 8.7* 8.2* 7.8*  MG  --  1.8 1.8  PHOS  --   --  3.9    GFR: Estimated Creatinine Clearance: 12.1 mL/min (A) (by C-G formula based on SCr of 5.18 mg/dL (H)). Recent Labs  Lab 07/04/2022 0110 06/14/2022 0348 06/15/2022 0914 06/23/22 0507  PROCALCITON  --  6.51  --  35.56  WBC 3.1*  --   --  12.4*  LATICACIDVEN  --  2.5* 1.3  --      Liver Function Tests: Recent Labs  Lab 06/10/2022 0110  AST 23  ALT 9  ALKPHOS 78  BILITOT 1.2  PROT 7.6  ALBUMIN 4.0    Recent Labs  Lab 07/10/2022 0110  LIPASE 38    No results for input(s): "AMMONIA" in the last 168 hours.  ABG    Component Value Date/Time   TCO2 31 10/27/2021 0811     Coagulation Profile: Recent Labs  Lab 06/26/2022 0348 06/23/22 0507  INR 1.4* 1.9*     Cardiac Enzymes: No results for  input(s): "CKTOTAL", "CKMB", "CKMBINDEX", "TROPONINI" in the last 168 hours.  HbA1C: Hgb A1c MFr Bld  Date/Time Value Ref Range Status  07/07/2022 01:10 AM 5.2 4.8 - 5.6 % Final    Comment:    (NOTE) Pre diabetes:          5.7%-6.4%  Diabetes:              >6.4%  Glycemic control for   <7.0% adults with diabetes   10/08/2019 03:16 AM 6.7 (H) 4.8 - 5.6 % Final    Comment:    (NOTE) Pre diabetes:          5.7%-6.4% Diabetes:              >6.4% Glycemic control for   <7.0% adults with diabetes     CBG: Recent Labs  Lab 06/12/2022 2321 06/23/22 0342 06/23/22 0751 06/23/22 1130 06/23/22 1523  GLUCAP 114* 99 129* 116* 143*  Review of Systems: Positives in BOLD  Gen: Denies fever, chills, weight change, fatigue, night sweats HEENT: Denies blurred vision, double vision, hearing loss, tinnitus, sinus congestion, rhinorrhea, sore throat, neck stiffness, dysphagia PULM: Denies shortness of breath, cough, sputum production, hemoptysis, wheezing CV: Denies chest pain, edema, orthopnea, paroxysmal nocturnal dyspnea, palpitations GI: abdominal pain, nausea, vomiting, diarrhea, hematochezia, melena, constipation, change in bowel habits GU: Denies dysuria, hematuria, polyuria, oliguria, urethral discharge Endocrine: Denies hot or cold intolerance, polyuria, polyphagia or appetite change Derm: Denies rash, dry skin, scaling or peeling skin change Heme: Denies easy bruising, bleeding, bleeding gums Neuro: Denies headache, numbness, weakness, slurred speech, loss of memory or consciousness   Past Medical History:  He,  has a past medical history of Anemia of chronic kidney failure, Arthritis, Benign prostate hyperplasia, Bradycardia, Cataracts, bilateral (05/15/2012), 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   Surgical History:   Past Surgical History:  Procedure Laterality Date   AV FISTULA PLACEMENT Left 11/19/2017   Procedure: ARTERIOVENOUS (AV) FISTULA CREATION;  Surgeon: [name], MD;  Location: [place];  Service: Vascular;  Laterality: Left;   AV FISTULA PLACEMENT Right 10/27/2021   Procedure: CREATION OF BRACHIOCEPHALIC RIGHT ARM ARTERIOVENOUS (AV) FISTULA;  Surgeon: [name], MD;  Location: [place];  Service: Vascular;  Laterality: Right;   [place] Left 06/04/2018   Procedure: BASILIC VEIN TRANSPOSITION ARM;  Surgeon: [name], MD;  Location: [place];  Service: Vascular;  Laterality: Left;   COLONOSCOPY     DIALYSIS/PERMA CATHETER REMOVAL N/A 03/23/2019   Procedure: DIALYSIS/PERMA CATHETER REMOVAL;  Surgeon: [name], MD;  Location: [place] [facility];  Service: Cardiovascular;  Laterality: N/A;   EYE SURGERY     cataract surgery b/l; photophobia since    INSERTION OF DIALYSIS CATHETER N/A 01/23/2019   Procedure: INSERTION OF DIALYSIS CATHETER;  Surgeon: [name], MD;  Location: [facility] OR;  Service: Vascular;  Laterality: N/A;  INSERTION OF DIALYSIS CATHETER   INSERTION OF DIALYSIS CATHETER  2022   right subclavian (chest area)   IR ANGIO INTRA EXTRACRAN SEL COM CAROTID INNOMINATE UNI L MOD SED  10/15/2019   IR ANGIO VERTEBRAL SEL SUBCLAVIAN INNOMINATE UNI R MOD SED  10/15/2019   IR CT HEAD LTD  10/15/2019   IR FLUORO GUIDE CV LINE RIGHT  10/08/2019   IR INTRAVSC STENT CERV CAROTID W/EMB-PROT MOD SED INCL ANGIO  10/15/2019   IR PERC  CHOLECYSTOSTOMY  06/20/2022   IR THROMBECTOMY AV FISTULA W/THROMBOLYSIS/PTA INC/SHUNT/IMG LEFT Left 10/09/2019   IR US GUIDE VASC ACCESS LEFT  10/09/2019   IR US GUIDE VASC ACCESS RIGHT  10/08/2019   KNEE ARTHROSCOPY Left    Knee   PERIPHERAL VASCULAR THROMBECTOMY Left 07/06/2019   Procedure: PERIPHERAL VASCULAR THROMBECTOMY;  Surgeon: [name], MD;  Location: [place] [facility];  Service: Cardiovascular;  Laterality: Left;   RADIOLOGY WITH ANESTHESIA N/A 10/15/2019   Procedure: stent placement;  Surgeon: Luanne Bras, MD;  Location: [place];  Service: Radiology;  Laterality: N/A;   THROMBECTOMY W/ EMBOLECTOMY Left 01/27/2019   Procedure: THROMBECTOMY Left arm ARTERIOVENOUS FISTULA  and  Left arm Arteriovenous gortex graft;  Surgeon: [name], MD;  Location: [facility] OR;  Service: Vascular;  Laterality: Left;   VENOGRAM Left 01/27/2019   Procedure: Left arm FISTULOGRAM/;  Surgeon: [name], MD;  Location: [place];  Service: Vascular;  Laterality: Left;     Social History:   reports that he quit smoking about 29 years ago. His smoking use included cigarettes. He has never used smokeless tobacco. He reports that he does not drink alcohol and does not use drugs.   Family History:  His family history includes Diabetes in his mother; Heart Problems in his mother; Kidney disease in his mother; Liver disease in his mother. There is no history of Stroke.   Allergies Allergies  Allergen Reactions   Ace Inhibitors Other (See Comments)    Hyperkalemia on ACE INHIBITORS Cr>1.9   Angiotensin Receptor Blockers Other (See Comments)    Hyperkalemia Elevates Cr > 1.9    Ivp Dye [Iodinated Contrast Media] Other (See Comments)    Cannot have due to renal failure   Adhesive [Tape] Other (See Comments)    UNDESCRIBED REACTION Can tolerate ONLY Coban wrap or mild paper tape!!     Home Medications  Prior to Admission medications   Medication Sig Start Date End Date Taking?  Authorizing Provider  ALPRAZolam Duanne Moron) 0.25 MG tablet Take 0.25 mg by mouth daily with supper. 01/11/20  Yes [provider]  apixaban (ELIQUIS) 2.5 MG TABS tablet TAKE ONE TABLET BY MOUTH TWICE A DAY. **THIS MEDICINE THINS THE BLOOD** 05/02/22  Yes Shirley Friar, PA-C  Cholecalciferol (VITAMIN D) 50 MCG (2000 UT) tablet Take 2,000 Units by mouth daily. 01/24/21  Yes [provider]  donepezil (ARICEPT) 5 MG tablet Take 1 tablet (5 mg total) by mouth at bedtime. 12/23/19  Yes Melvenia Beam, MD  lidocaine-prilocaine (EMLA) cream Apply 1 application topically See admin instructions. Apply to access site 1 to 2 hours before dialysis. Cover with occlusive dressing.On Tuesday,Thursday and Saturday 04/08/19  Yes [provider]  Melatonin 10 MG TABS Take 10 mg by mouth at bedtime.   Yes [provider]  midodrine (PROAMATINE) 10 MG tablet Take 1 tablet (10 mg total) by mouth 3 (three) times daily with meals. Patient taking differently: Take 10 mg by mouth 3 (three) times daily with meals. Use as directed 12/14/19  Yes Tillery, Satira Mccallum, PA-C  midodrine (PROAMATINE) 5 MG tablet 5 mg. Take 5 mg by mouth at 10:30 and 1 pm on Dialysis days   Yes [provider]  neomycin-bacitracin-polymyxin (NEOSPORIN) [phone] ointment Apply 1 Application topically 2 (two) times a week.   Yes [provider]  pantoprazole (PROTONIX) 40 MG tablet Take 1 tablet (40 mg total) by mouth daily. Patient taking differently: Take 40 mg by mouth daily before breakfast. 10/23/19 06/29/2022 Yes Regalado, Belkys A, MD  sevelamer carbonate (RENVELA) 800 MG tablet Take 3 tablets (2,400 mg total) by mouth 3 (three) times daily with meals. Patient taking differently: Take 2,400 mg by mouth 3 (three) times daily with meals. Use as directed 10/23/19  Yes Regalado, Belkys A, MD  Skin Protectants, Misc. (MINERIN CREME EX) Apply 1 application topically daily. Apply to both arms and  legs   Yes [provider]  tamsulosin (FLOMAX) 0.4 MG CAPS capsule Take 1 capsule (0.4 mg total) by mouth daily. Patient taking differently: Take 0.8 mg by mouth daily. 10/23/19  Yes Regalado, Belkys A, MD  tiZANidine (ZANAFLEX) 2 MG tablet  Take 2 mg by mouth at bedtime. 03/27/21  Yes [provider]  traZODone (DESYREL) 50 MG tablet Take 50 mg by mouth at bedtime.   Yes [provider]  zinc oxide (BALMEX) 11.3 % CREA cream Apply 1 application topically See admin instructions. Apply to bottom and groin area 3 times daily and as needed for incontinence care   Yes [provider]  acetaminophen (TYLENOL) 325 MG tablet Take 650 mg by mouth every 6 (six) hours as needed for moderate pain.    [provider]  albuterol (VENTOLIN HFA) 108 (90 Base) MCG/ACT inhaler Inhale 2 puffs into the lungs every 6 (six) hours as needed for shortness of breath. 12/20/20   [provider]  allopurinol (ZYLOPRIM) 100 MG tablet Take 1 tablet (100 mg total) by mouth daily. 06/03/19   McLean-Scocuzza, Nino Glow, MD  ALPRAZolam Duanne Moron) 0.5 MG tablet Take 0.25 mg by mouth Every Tuesday,Thursday,and Saturday with dialysis. 09/21/21   [provider]  aspirin 81 MG chewable tablet Chew 81 mg by mouth daily.    [provider]  atorvastatin (LIPITOR) 20 MG tablet Take 20 mg by mouth daily.    [provider]  benzonatate (TESSALON) 200 MG capsule Take 200 mg by mouth 3 (three) times daily as needed for cough. Patient not taking: Reported on 06/25/2022    [provider]  bisacodyl (DULCOLAX) 10 MG suppository Place 10 mg rectally as needed for moderate constipation. Patient not taking: Reported on 07/10/2022    [provider]  Camphor-Menthol (FREEZE IT EX) Apply 1 application topically every 6 (six) hours as needed (back and neck pain).    [provider]  docusate sodium (COLACE) 100 MG capsule Take 200 mg by mouth daily as  needed for moderate constipation. Patient not taking: Reported on 06/19/2022    [provider]  guaiFENesin (MUCINEX) 600 MG 12 hr tablet Take 600 mg by mouth every 12 (twelve) hours as needed (congestion).    [provider]  levothyroxine (SYNTHROID) 150 MCG tablet Take 150 mcg by mouth daily. 05/21/22   [provider]  ondansetron (ZOFRAN) 4 MG tablet Take 1 tablet (4 mg total) by mouth every 8 (eight) hours as needed. 11/06/20   Nance Pear, MD  senna (SENOKOT) 8.6 MG TABS tablet Take 2 tablets by mouth at bedtime as needed for moderate constipation.    [provider]  umeclidinium-vilanterol (ANORO ELLIPTA) 62.5-25 MCG/ACT AEPB Inhale 1 puff into the lungs daily.    [provider]     Critical care provider statement:   Total critical care time: 33 minutes   Performed by: Lanney Gins MD   Critical care time was exclusive of separately billable procedures and treating other patients.   Critical care was necessary to treat or prevent imminent or life-threatening deterioration.   Critical care was time spent personally by me on the following activities: development of treatment plan with patient and/or surrogate as well as nursing, discussions with consultants, evaluation of patient's response to treatment, examination of patient, obtaining history from patient or surrogate, ordering and performing treatments and interventions, ordering and review of laboratory studies, ordering and review of radiographic studies, pulse oximetry and re-evaluation of patient's condition.    Ottie Glazier, M.D.  Pulmonary & Critical Care Medicine

## 2022-06-23 NOTE — Consult Note (Signed)
Frank [name] MRN: [id number] DOB/AGE: 80-Jul-1942 80 y.o. Primary Care Physician:Hagan, Edmonia Lynch, NP Admit date: 06/21/2022 Chief Complaint:  Chief Complaint  Patient presents with   Abdominal Pain   HPI:   Patient is a 81 year old Caucasian male with a past medical history of diabetes mellitus, end-stage renal disease on hemodialysis on Tuesday Thursday Saturday schedule, hypothyroidism, depression, CVA, atrial fibrillation-on chronic anticoagulation therapy, anemia of chronic disease who was brought to the ER from [facility] assisted living with chief complaint of abdominal pain.  History of present illness date backs to 2 or 3 days when patient started having abdominal pain it was epigastric in location with some pain in the right upper quadrant as well. Upon evaluation in the ER patient was found to be febrile with a temperature of 103, patient was found to have lactic acidosis at level of 2.5.  Patient was also found to be hypotensive with SBP in 80.  After undergoing CT scan patient was found to have acute cholecystitis. Patient was thereafter admitted.  Nephrology was consulted for comanagement of dialysis patient Patient was seen today in ICU. Patient main concern was that when he takes a deep breath then the right side of his abdomen hurt where they recently put the tube. Patient offers no complaint of fever/cough No complaint of nausea/vomiting. Patient informed me that his abdominal pain has now improved No complaint of hematuria No complaint of chest pain No complaint of shortness of breath No complaint of change in speech or change in vision    Past Medical History:  Diagnosis Date   Anemia of chronic kidney failure    Arthritis    Benign prostate hyperplasia    Bradycardia    Cataracts, bilateral 05/15/2012   Chronic idiopathic gout of multiple sites 02/10/2015   Chronic kidney disease    [facility]- ESRD   Depression    Diabetes mellitus without  complication ([place])    diet controlled    Diabetic nephropathy ([place])    ED (erectile dysfunction) 08/28/2012   Elevated liver enzymes 06/10/2019   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 HTN PLAN   BP goal is <140/90 BP Readings from Last 3 Encounters:  08/27/16 140/79  08/13/16 141/80  08/06/16 144/86   Current Anti-Hyptensive Medications: None  Today's Recommendations: Continue lifestyle modifications. Defer starting medication to PCP.  Recommend continued regular exercise as tolerated. Recomm   History of UTI    s/p foley in hospital 01/2019    Hyperlipidemia    Hypothyroidism    Illiterate 11/19/2013   Last Assessment & Plan:  Assisted patient with Handicapped Placard application today.     Insomnia 05/08/2019   Neuropathy    Peripheral neuropathy    Peripheral vascular disease (HCC)    Second degree heart block    Secondary hyperparathyroidism ([place])    Renal   Stroke ([place]) 2021   Thyroid disease         Family History  Problem Relation Age of Onset   Diabetes Mother    Kidney disease Mother    Liver disease Mother    Heart Problems Mother    Stroke Neg Hx     Social History:  reports that he quit smoking about 29 years ago. His smoking use included cigarettes. He has never used smokeless tobacco. He  reports that he does not drink alcohol and does not use drugs.   Allergies:  Allergies  Allergen Reactions   Ace Inhibitors Other (See Comments)    Hyperkalemia on ACE INHIBITORS Cr>1.9   Angiotensin Receptor Blockers Other (See Comments)    Hyperkalemia Elevates Cr > 1.9    Ivp Dye [Iodinated Contrast Media] Other (See Comments)    Cannot have due to renal failure   Adhesive [Tape] Other (See Comments)    UNDESCRIBED REACTION Can tolerate ONLY Coban wrap or mild paper tape!!    Medications Prior  to Admission  Medication Sig Dispense Refill   ALPRAZolam (XANAX) 0.25 MG tablet Take 0.25 mg by mouth daily with supper.     apixaban (ELIQUIS) 2.5 MG TABS tablet TAKE ONE TABLET BY MOUTH TWICE A DAY. **THIS MEDICINE THINS THE BLOOD** 60 tablet 5   Cholecalciferol (VITAMIN D) 50 MCG (2000 UT) tablet Take 2,000 Units by mouth daily.     donepezil (ARICEPT) 5 MG tablet Take 1 tablet (5 mg total) by mouth at bedtime. 30 tablet 6   lidocaine-prilocaine (EMLA) cream Apply 1 application topically See admin instructions. Apply to access site 1 to 2 hours before dialysis. Cover with occlusive dressing.On Tuesday,Thursday and Saturday     Melatonin 10 MG TABS Take 10 mg by mouth at bedtime.     midodrine (PROAMATINE) 10 MG tablet Take 1 tablet (10 mg total) by mouth 3 (three) times daily with meals. (Patient taking differently: Take 10 mg by mouth 3 (three) times daily with meals. Use as directed) 270 tablet 3   midodrine (PROAMATINE) 5 MG tablet 5 mg. Take 5 mg by mouth at 10:30 and 1 pm on Dialysis days     neomycin-bacitracin-polymyxin (NEOSPORIN) [phone] ointment Apply 1 Application topically 2 (two) times a week.     pantoprazole (PROTONIX) 40 MG tablet Take 1 tablet (40 mg total) by mouth daily. (Patient taking differently: Take 40 mg by mouth daily before breakfast.) 30 tablet 1   sevelamer carbonate (RENVELA) 800 MG tablet Take 3 tablets (2,400 mg total) by mouth 3 (three) times daily with meals. (Patient taking differently: Take 2,400 mg by mouth 3 (three) times daily with meals. Use as directed) 120 tablet 0   Skin Protectants, Misc. (MINERIN CREME EX) Apply 1 application topically daily. Apply to both arms and legs     tamsulosin (FLOMAX) 0.4 MG CAPS capsule Take 1 capsule (0.4 mg total) by mouth daily. (Patient taking differently: Take 0.8 mg by mouth daily.) 30 capsule 1   tiZANidine (ZANAFLEX) 2 MG tablet Take 2 mg by mouth at bedtime.     traZODone (DESYREL) 50 MG tablet Take 50 mg by mouth  at bedtime.     zinc oxide (BALMEX) 11.3 % CREA cream Apply 1 application topically See admin instructions. Apply to bottom and groin area 3 times daily and as needed for incontinence care     acetaminophen (TYLENOL) 325 MG tablet Take 650 mg by mouth every 6 (six) hours as needed for moderate pain.     albuterol (VENTOLIN HFA) 108 (90 Base) MCG/ACT inhaler Inhale 2 puffs into the lungs every 6 (six) hours as needed for shortness of breath.     allopurinol (ZYLOPRIM) 100 MG tablet Take 1 tablet (100 mg total) by mouth daily. 90 tablet 3   ALPRAZolam (XANAX) 0.5 MG tablet Take 0.25 mg by mouth Every Tuesday,Thursday,and Saturday with dialysis.     aspirin 81 MG chewable tablet Chew 81 mg by  mouth daily.     atorvastatin (LIPITOR) 20 MG tablet Take 20 mg by mouth daily.     benzonatate (TESSALON) 200 MG capsule Take 200 mg by mouth 3 (three) times daily as needed for cough. (Patient not taking: Reported on 06/26/2022)     bisacodyl (DULCOLAX) 10 MG suppository Place 10 mg rectally as needed for moderate constipation. (Patient not taking: Reported on 07/09/2022)     Camphor-Menthol (FREEZE IT EX) Apply 1 application topically every 6 (six) hours as needed (back and neck pain).     docusate sodium (COLACE) 100 MG capsule Take 200 mg by mouth daily as needed for moderate constipation. (Patient not taking: Reported on 06/25/2022)     guaiFENesin (MUCINEX) 600 MG 12 hr tablet Take 600 mg by mouth every 12 (twelve) hours as needed (congestion).     levothyroxine (SYNTHROID) 150 MCG tablet Take 150 mcg by mouth daily.     ondansetron (ZOFRAN) 4 MG tablet Take 1 tablet (4 mg total) by mouth every 8 (eight) hours as needed. 20 tablet 0   senna (SENOKOT) 8.6 MG TABS tablet Take 2 tablets by mouth at bedtime as needed for moderate constipation.     umeclidinium-vilanterol (ANORO ELLIPTA) 62.5-25 MCG/ACT AEPB Inhale 1 puff into the lungs daily.         SWH:QPRFF from the symptoms mentioned above,there are no  other symptoms referable to all systems reviewed.   allopurinol  100 mg Oral Daily   ALPRAZolam  0.25 mg Oral Q supper   ALPRAZolam  0.25 mg Oral Q T,Th,Sa-HD   atorvastatin  20 mg Oral Daily   Chlorhexidine Gluconate Cloth  6 each Topical Q0600   cholecalciferol  2,000 Units Oral Daily   donepezil  5 mg Oral QHS   insulin aspart  0-6 Units Subcutaneous Q4H   levothyroxine  150 mcg Oral Daily   lidocaine-prilocaine  1 Application Topical See admin instructions   melatonin  10 mg Oral QHS   midodrine  10 mg Oral TID WC   midodrine  5 mg Oral 2 times per day on Tue Thu Sat   [START ON 06/25/2022] neomycin-bacitracin-polymyxin  1 Application Topical Once per day on Mon Thu   sevelamer carbonate  2,400 mg Oral TID WC   sodium chloride flush  5 mL Intracatheter Q8H   tiZANidine  2 mg Oral QHS   traZODone  50 mg Oral QHS   umeclidinium-vilanterol  1 puff Inhalation Daily        Physical Exam: Vital signs in last 24 hours: Temp:  [98 F (36.7 C)-99 F (37.2 C)] 98 F (36.7 C) (10/14 0800) Pulse Rate:  [28-95] 63 (10/14 0800) Resp:  [10-34] 34 (10/14 0800) BP: (77-101)/(38-86) 101/45 (10/14 0800) SpO2:  [98 %-100 %] 100 % (10/14 0800) Weight:  [85.9 kg] 85.9 kg (10/13 1252) Weight change: 0.17 kg Last BM Date :  (PTA)  Intake/Output from previous day: 10/13 0701 - 10/14 0700 In: 494.8 [I.V.:344.8; IV Piggyback:150] Out: 90 [Drains:90] No intake/output data recorded.   Physical Exam: General- pt is awake,alert, oriented to time place and person, Chronically ill-appearing Resp- No acute REsp distress, Decreased breath sounds at bases CVS- S1S2 regular ij rate and rhythm GIT- BS+, soft, NT,JP drain in situ EXT- NO LE Edema, Cyanosis CNS- CN 2-12 grossly intact. Moving all 4 extremities Psych- normal mod and affect Access-  AVF positive bruit and thrill   Lab Results: CBC Recent Labs    06/30/2022 0110 06/23/22 0507  WBC 3.1* 12.4*  HGB 13.0 10.3*  HCT 43.5 33.9*   PLT 153 123*    BMET Recent Labs    06/25/2022 2012 06/23/22 0507  NA 136 137  K 4.0 3.9  CL 97* 97*  CO2 28 27  GLUCOSE 121* 159*  BUN 33* 37*  CREATININE 5.11* 5.18*  CALCIUM 8.2* 7.8*    MICRO Recent Results (from the past 240 hour(s))  Blood culture (routine x 2)     Status: None (Preliminary result)   Collection Time: 07/04/2022  3:48 AM   Specimen: BLOOD  Result Value Ref Range Status   Specimen Description BLOOD LEFT FOREARM  Final   Special Requests   Final    BOTTLES DRAWN AEROBIC AND ANAEROBIC Blood Culture results may not be optimal due to an excessive volume of blood received in culture bottles   Culture   Final    NO GROWTH 1 DAY Performed at [facility], [address] [place], [place] [address]    Report Status PENDING  Incomplete  Blood culture (routine x 2)     Status: None (Preliminary result)   Collection Time: 06/16/2022  3:48 AM   Specimen: BLOOD  Result Value Ref Range Status   Specimen Description BLOOD LEFT ASSIST CONTROL  Final   Special Requests   Final    BOTTLES DRAWN AEROBIC AND ANAEROBIC Blood Culture results may not be optimal due to an excessive volume of blood received in culture bottles   Culture   Final    NO GROWTH 1 DAY Performed at [facility], [address] [place], [place] [address]    Report Status PENDING  Incomplete  SARS Coronavirus 2 by RT PCR (hospital order, performed in [place] hospital lab) *cepheid single result test* Anterior Nasal Swab     Status: None   Collection Time: 06/26/2022 12:07 PM   Specimen: Anterior Nasal Swab  Result Value Ref Range Status   SARS Coronavirus 2 by RT PCR NEGATIVE NEGATIVE Final    Comment: (NOTE) SARS-CoV-2 target nucleic acids are NOT DETECTED.  The SARS-CoV-2 RNA is generally detectable in upper and lower respiratory specimens during the acute phase of infection. The lowest concentration of SARS-CoV-2 viral copies this assay can detect is 250 copies / mL. A  negative result does not preclude SARS-CoV-2 infection and should not be used as the sole basis for treatment or other patient management decisions.  A negative result may occur with improper specimen collection / handling, submission of specimen other than nasopharyngeal swab, presence of viral mutation(s) within the areas targeted by this assay, and inadequate number of viral copies (<250 copies / mL). A negative result must be combined with clinical observations, patient history, and epidemiological information.  Fact Sheet for Patients:   https://www.patel.info/  Fact Sheet for Healthcare Providers: https://hall.com/  This test is not yet approved or  cleared by the [place] FDA and has been authorized for detection and/or diagnosis of SARS-CoV-2 by FDA under an Emergency Use Authorization (EUA).  This EUA will remain in effect (meaning this test can be used) for the duration of the COVID-19 declaration under Section 564(b)(1) of the Act, 21 U.S.C. section 360bbb-3(b)(1), unless the authorization is terminated or revoked sooner.  Performed at [facility], [address] [place], [place] [address]   MRSA Next Gen by PCR, Nasal     Status: None   Collection Time: 06/23/2022 12:58 PM   Specimen: Nasal Mucosa; Nasal Swab  Result  Value Ref Range Status   MRSA by PCR Next Gen NOT DETECTED NOT DETECTED Final    Comment: (NOTE) The GeneXpert MRSA Assay (FDA approved for NASAL specimens only), is one component of a comprehensive MRSA colonization surveillance program. It is not intended to diagnose MRSA infection nor to guide or monitor treatment for MRSA infections. Test performance is not FDA approved in patients less than 72 years old. Performed at [facility], [address] [place], [place] [address]       Lab Results  Component Value Date   PTH 184 (H) 01/23/2019   CALCIUM 7.8 (L) 06/23/2022   CAION  1.15 10/27/2021   PHOS 3.9 06/23/2022      Impression:    1)Renal    End-stage renal disease Patient is on hemodialysis Patient is on Tuesday Thursday Saturday schedule Patient undergoes dialysis at [facility] under the care of Dr. Graylon Gunning   2)Hypotension Patient is on midodrine Patient is currently on vasopressors  3)Anemia of chronic disease  HGb at goal (9--11)   4)Secondary Hyerparathyroidism - CKD Mineral-Bone Disorder PTH acceptable  Secondary Hyperparathyroidism  present  PTH at 201 on 10.3 Phosphorus at goal.   5)Acute cholecystitis Patient is being followed by primary team, surgery team Patient is to have percutaneous cholecystostomy tube  6)Electrolytes Normokalemic NOrmonatremic   7)Acid base Co2 at goal  9)Atrial fibrillation Patient was on anticoagulant as an outpatient Currently on hold secondary to planned procedure   Plan:   We will dialyze patient today We will continue patient on midodrine We will continue patient on vasopressors We will use 3K bath     Montoya Brandel s [facility] 06/23/2022, 8:38 AM

## 2022-06-23 NOTE — Progress Notes (Signed)
patient in ICU bed .  Alert and oriented.  Informed consent signed and in chart.   Treatment initiated: 1800 Treatment completed: 2100  Patient tolerated well.   Alert, without acute distress.  Hand-off given to patient's nurse.   Access used: FISTULA Access issues: NONE  Total UF removed: 1000 Medication(s) given: NONE Post HD VS: 100 ORAL, 130/59 84 20 99 [place] 2L Post HD weight: 87.8 KG   [facility]

## 2022-06-23 NOTE — Consult Note (Signed)
Pharmacy Antibiotic Note  Frank [name] is a 81 y.o. male admitted on 07/08/2022 with  intra-abdominal infection . Pharmacy has been consulted for Zosyn dosing.  Assessment: 81 yo M with PMH ESRD-HD (TTS), anemia of CKD, hypothyroidism, depression, CVA, Afib (on Eliquis) presenting with abdominal pain. CTAP shows cholelithiasis with gallbladder wall thickening and edema suggesting acute cholecystitis. Pt is febrile (Tmax 103), tachypneic, hypotensive, lactic acid 2.5, with low WBC of 3.1. Pt received one-time doses of Zosyn 3.375 g and metronidazole 500 mg in the ED. The one-time dose of metronidazole was because the initial antibiotic plan was cefepime + metronidazole before treatment team changed plan to Zosyn monotherapy. Pt will be admitted to CCU. COVID-19 PCR and Bcx have been collected.  Plan: Initiate Zosyn 2.25 g IV q8H for HD dosing Monitor for signs & symptoms of worsening infection to assess for antibiotic efficacy Follow up culture results to assess for opportunities to narrow therapy  Height: 5\' 8"  (172.7 cm) Weight: 85.9 kg (189 lb 6 oz) IBW/kg (Calculated) : 68.4  Temp (24hrs), Avg:98.6 F (37 C), Min:98 F (36.7 C), Max:99 F (37.2 C)  Recent Labs  Lab 06/21/2022 0110 06/12/2022 0348 06/12/2022 0914 06/25/2022 2012 06/23/22 0507  WBC 3.1*  --   --   --  12.4*  CREATININE 4.05*  --   --  5.11* 5.18*  LATICACIDVEN  --  2.5* 1.3  --   --      Estimated Creatinine Clearance: 12.1 mL/min (A) (by C-G formula based on SCr of 5.18 mg/dL (H)).    Allergies  Allergen Reactions   Ace Inhibitors Other (See Comments)    Hyperkalemia on ACE INHIBITORS Cr>1.9   Angiotensin Receptor Blockers Other (See Comments)    Hyperkalemia Elevates Cr > 1.9    Ivp Dye [Iodinated Contrast Media] Other (See Comments)    Cannot have due to renal failure   Adhesive [Tape] Other (See Comments)    UNDESCRIBED REACTION Can tolerate ONLY Coban wrap or mild paper tape!!    Antimicrobials  this admission: Zosyn 10/13 >>  Metronidazole 10/13 x 1 (given because initial plan was for cefepime + Flagyl before plan was switched to Zosyn monotherapy)  Dose adjustments this admission: N/A  Microbiology results: 10/13 BCx: collected 10/13 COVID19 PCR: collected  Thank you for allowing pharmacy to be a part of this patient's care.  Chinita [place] PharmD Clinical Pharmacist 06/23/2022

## 2022-06-23 NOTE — Progress Notes (Signed)
Pt aspirated while taking his medications .... Pt was sitting at 45 degrees at time of aspiration.... chest x ray has been ordered... Sat now is 91, HR 107 RR 29 ... Pt is A&O x4.... Notified MD.

## 2022-06-23 NOTE — Progress Notes (Signed)
Subjective:  CC: Frank [name] is a 81 y.o. male  Hospital stay day 1,   IR guided percutaneous cholecystostomy tube placement for acute cholecystitis, complicated by significant comorbid conditions and need for anticoagulation.  HPI: No acute issues reported overnight.  Persistent hypotension requiring pressor use.  Patient states pain is more localized to the drain insertion site.  ROS:  General: Denies weight loss, weight gain, fatigue, fevers, chills, and night sweats. Heart: Denies chest pain, palpitations, racing heart, irregular heartbeat, leg pain or swelling, and decreased activity tolerance. Respiratory: Denies breathing difficulty, shortness of breath, wheezing, cough, and sputum. GI: Denies change in appetite, heartburn, nausea, vomiting, constipation, diarrhea, and blood in stool. GU: Denies difficulty urinating, pain with urinating, urgency, frequency, blood in urine.   Objective:   Temp:  [98 F (36.7 C)-98.9 F (37.2 C)] 98 F (36.7 C) (10/14 0800) Pulse Rate:  [28-95] 84 (10/14 1100) Resp:  [10-34] 23 (10/14 1100) BP: (77-104)/(38-86) 100/49 (10/14 1100) SpO2:  [98 %-100 %] 100 % (10/14 1100)     Height: 5\' 8"  (172.7 cm) Weight: 85.9 kg BMI (Calculated): 28.8   Intake/Output this shift:   Intake/Output Summary (Last 24 hours) at 06/23/2022 1353 Last data filed at 06/23/2022 1000 Gross per 24 hour  Intake 562.58 ml  Output 90 ml  Net 472.58 ml    Constitutional :  alert, cooperative, appears stated age, and no distress  Respiratory:  clear to auscultation bilaterally  Cardiovascular:  regular rate and rhythm  Gastrointestinal: Soft, no guarding, right upper quadrant pain at insertion site of cholecystostomy tube which is draining dark bile. .   Skin: Cool and moist.   Psychiatric: Normal affect, non-agitated, not confused       LABS:     Latest Ref Rng & Units 06/23/2022    5:07 AM 06/11/2022    8:12 PM 07/07/2022    1:10 AM  CMP  Glucose 70 - 99  mg/dL 159  121  163   BUN 8 - 23 mg/dL 37  33  22   Creatinine 0.61 - 1.24 mg/dL 5.18  5.11  4.05   Sodium 135 - 145 mmol/L 137  136  138   Potassium 3.5 - 5.1 mmol/L 3.9  4.0  3.2   Chloride 98 - 111 mmol/L 97  97  94   CO2 22 - 32 mmol/L 27  28  25    Calcium 8.9 - 10.3 mg/dL 7.8  8.2  8.7   Total Protein 6.5 - 8.1 g/dL   7.6   Total Bilirubin 0.3 - 1.2 mg/dL   1.2   Alkaline Phos 38 - 126 U/L   78   AST 15 - 41 U/L   23   ALT 0 - 44 U/L   9       Latest Ref Rng & Units 06/23/2022    5:07 AM 07/03/2022    1:10 AM 10/27/2021    8:11 AM  CBC  WBC 4.0 - 10.5 K/uL 12.4  3.1    Hemoglobin 13.0 - 17.0 g/dL 10.3  13.0  12.2   Hematocrit 39.0 - 52.0 % 33.9  43.5  36.0   Platelets 150 - 400 K/uL 123  153      RADS: CLINICAL DATA:  Cholelithiasis, gallbladder wall thickening and edema suggesting acute cholecystitis, sepsis. General surgery requested consultation for cholecystostomy catheter placement.   EXAM: PERCUTANEOUS CHOLECYSTOSTOMY TUBE PLACEMENT WITH ULTRASOUND AND FLUOROSCOPIC GUIDANCE   FLUOROSCOPY: Radiation Exposure Index (as provided by  the fluoroscopic device): 6.6 mGy air Kerma   TECHNIQUE: The procedure, risks (including but not limited to bleeding, infection, organ damage ), benefits, and alternatives were explained to the patient. Questions regarding the procedure were encouraged and answered. The patient understands and consents to the procedure. Survey ultrasound of the abdomen was performed and an appropriate skin entry site was identified. Skin site was marked, prepped with Betadine, and draped in usual sterile fashion, and infiltrated locally with 1% lidocaine.   Intravenous Fentanyl 2mcg and Versed 0.5mg  were administered as conscious sedation during continuous monitoring of the patient's level of consciousness and physiological / cardiorespiratory status by the radiology RN, with a total moderate sedation time of 10 minutes.   Under real-time  ultrasound guidance, gallbladder was accessed using a transhepatic approach with a 21-gauge needle. Ultrasound image documentation was saved. Bile returned through the hub. Needle was exchanged over a 018 guidewire for transitional dilator which allowed placement of 035 wire. Over this, a 10.2 French pigtail catheter was advanced and formed centrally in the gallbladder lumen. Small contrast injection confirmed appropriate position. Catheter secured externally with 0 Prolene suture and placed external drain bag. Patient tolerated the procedure well.   COMPLICATIONS: COMPLICATIONS none   IMPRESSION: 1. Technically successful percutaneous cholecystostomy tube placement with ultrasound and fluoroscopic guidance.     Electronically Signed   By: Lucrezia Europe M.D.   On: 07/04/2022 15:07 Assessment:   IR guided percutaneous cholecystostomy tube placement for acute cholecystitis, complicated by significant comorbid conditions and need for anticoagulation.  Doing well from gallbladder standpoint.  Recommend continuing to monitor for any recurrent symptoms.  Further care per ICU team regarding his persistent hypotension.  Okay to advance diet as tolerated from surgery standpoint.  Will peripherally follow for now.  Please call with any additional questions.  Follow-up regarding drain care and outpatient management per radiology team.  labs/images/medications/previous chart entries reviewed personally and relevant changes/updates noted above.

## 2022-06-23 NOTE — Progress Notes (Signed)
PROGRESS NOTE  Frank [name]  DOB: Jan 14, 1941  PCP: Verl Blalock, NP VEL:[id number]  DOA: 06/15/2022  LOS: 1 day  Hospital Day: 2  Brief narrative: Frank [name] is a 81 y.o. male with PMH significant for ESRD-HD-TTS, DM2, HTN, HLD, CAD, A-fib on Eliquis, CVA, PAD, chronic anemia, hypothyroidism, depression, BPH, gout who lives at [facility] assisted living. 10/13, patient presented to the ED for evaluation of epigastric, right upper quadrant pain, stated with nausea, vomiting. In the ED, patient had temperature of 103, heart rate 98, blood pressure in 90s which later dropped down to 80s.  Initially on room air but later required up to 4 L of oxygen Labs with WBC 3.1, hemoglobin 13, potassium 3.2, lactic acid elevated 2.5 troponin elevated to 49>51 CT abdomen showed cholelithiasis with gallbladder wall thickening and edema suggesting acute cholecystitis. Blood cultures sent.  Started on empiric IV antibiotic coverage with IV Zosyn. Patient was given IV fluid boluses to support blood pressure. Admitted to [facility], IR and nephrology consulted. With persistent low blood pressure, PCCM was consulted as well. The same day, patient underwent percutaneous cholecystostomy tube placement by IR  Subjective: Patient was seen and examined this morning in ICU.  Present elderly Caucasian male.  Not in physical distress.  Family members at bedside.  Patient's blood pressure is running low in 25E systolic but patient was mentating okay. Chart reviewed. No recurrence of fever since the initial 103 at presentation, heart rate in 70s, blood pressure running low over 1980s to 90s.   Last set of labs from this morning showed WBC count of 12.4, hemoglobin 10.3.  Assessment and plan: Severe sepsis with hypotension - POA Presented with fever, hypotension, lactic acidosis  Blood culture sent.  Empiric IV antibiotics started. Fluid boluses given.  PCCM consult appreciated.  Requiring  low-dose of Levophed this morning.  Acute cholecystitis S/p percutaneous cholecystostomy tube placement by IR -10/13 Presented with right upper quadrant pain, gastric pain, vomiting. CT abdomen finding as above showing acute cholecystitis General surgery recommend percutaneous cholecystostomy tube placement to allow for optimization for interval cholecystectomy in 6 to 8 weeks. Continue IV antibiotic therapy with Zosyn  ESRD-HD-TTS Chronic hypotension Patient requires IV fluid because of sepsis.  Nephrology consulted. On midodrine twice daily TTS for chronic hypotension.  Type 2 diabetes mellitus A1c 5.2 on 06/21/2022 PTA not on meds Currently on sliding scale insulin with Accu-Cheks Recent Labs  Lab 06/20/2022 2000 06/19/2022 2321 06/23/22 0342 06/23/22 0751 06/23/22 1130  GLUCAP 107* 114* 99 129* 116*    Elevated troponin  History of CAD History of stroke, PAD Troponin elevated likely due to sepsis.  No active chest pain.   PTA on Eliquis, aspirin, statin. Keep Eliquis and aspirin on hold Recent Labs    06/13/2022 0110 06/10/2022 0348 07/02/2022 1207  TROPONINIHS 49* 51* 80*    Atrial fibrillation, chronic  Eliquis on hold  Hypothyroidism Stable Continue Synthroid   BPH Flomax 0.4 mg daily.  Anxiety/depression Xanax, Aricept, trazodone   Goals of care   Code Status: Full Code   Mobility: Encourage ambulation.  PT eval  Skin assessment:  Pressure Injury 06/27/2022 Coccyx Mid Stage 1 -  Intact skin with non-blanchable redness of a localized area usually over a bony prominence. (Active)  06/15/2022 1300  Location: Coccyx  Location Orientation: Mid  Staging: Stage 1 -  Intact skin with non-blanchable redness of a localized area usually over a bony prominence.  Wound Description (Comments):   Present  on Admission: Yes    Nutritional status:  Body mass index is 28.79 kg/m.          Diet:  Diet Order             Diet clear liquid Room service appropriate?  Yes; Fluid consistency: Thin  Diet effective now                   DVT prophylaxis:  SCDs Start: 06/11/2022 0951   Antimicrobials: IV Zosyn Fluid: Levophed drip Consultants: Critical care, nephrology Family Communication: Family members at bedside  Status is: Inpatient  Continue in-hospital care because: Pending culture report, pending PT eval.  Continues to remain hypotensive Level of care: Stepdown   Dispo: The patient is from: Assisted living facility              Anticipated d/c is to: Back to assisted living facility hopefully              Patient currently is not medically stable to d/c.   Difficult to place patient No     Infusions:   sodium chloride     magnesium sulfate bolus IVPB 1 g (06/23/22 1231)   norepinephrine (LEVOPHED) Adult infusion 5 mcg/min (06/23/22 1230)   piperacillin-tazobactam (ZOSYN)  IV 2.25 g (06/23/22 1146)    Scheduled Meds:  allopurinol  100 mg Oral Daily   ALPRAZolam  0.25 mg Oral Q supper   ALPRAZolam  0.25 mg Oral Q T,Th,Sa-HD   atorvastatin  20 mg Oral Daily   Chlorhexidine Gluconate Cloth  6 each Topical Q0600   [START ON 06/24/2022] Chlorhexidine Gluconate Cloth  6 each Topical Q0600   cholecalciferol  2,000 Units Oral Daily   donepezil  5 mg Oral QHS   insulin aspart  0-6 Units Subcutaneous Q4H   levothyroxine  150 mcg Oral Daily   lidocaine-prilocaine  1 Application Topical See admin instructions   melatonin  10 mg Oral QHS   midodrine  10 mg Oral TID WC   midodrine  5 mg Oral 2 times per day on Tue Thu Sat   [START ON 06/25/2022] neomycin-bacitracin-polymyxin  1 Application Topical Once per day on Mon Thu   sevelamer carbonate  2,400 mg Oral TID WC   sodium chloride flush  5 mL Intracatheter Q8H   tiZANidine  2 mg Oral QHS   traZODone  50 mg Oral QHS   umeclidinium-vilanterol  1 puff Inhalation Daily    PRN meds: acetaminophen, albuterol, HYDROcodone-acetaminophen, ondansetron **OR** ondansetron (ZOFRAN) IV, mouth  rinse, senna, zinc oxide   Antimicrobials: Anti-infectives (From admission, onward)    Start     Dose/Rate Route Frequency Ordered Stop   06/24/2022 1200  piperacillin-tazobactam (ZOSYN) IVPB 2.25 g        2.25 g 100 mL/hr over 30 Minutes Intravenous Every 8 hours 06/13/2022 1046     06/29/2022 1000  ceFEPIme (MAXIPIME) 2 g in sodium chloride 0.9 % 100 mL IVPB  Status:  Discontinued        2 g 200 mL/hr over 30 Minutes Intravenous  Once 07/08/2022 0952 07/04/2022 1041   07/02/2022 1000  metroNIDAZOLE (FLAGYL) IVPB 500 mg  Status:  Discontinued        500 mg 100 mL/hr over 60 Minutes Intravenous Every 12 hours 06/12/2022 0952 06/18/2022 1041   06/21/2022 0330  piperacillin-tazobactam (ZOSYN) IVPB 3.375 g        3.375 g 100 mL/hr over 30 Minutes Intravenous  Once 06/12/2022 0317 06/26/2022 0440  Objective: Vitals:   06/23/22 1000 06/23/22 1100  BP: (!) 104/56 (!) 100/49  Pulse: 65 84  Resp: (!) 27 (!) 23  Temp:    SpO2: 99% 100%    Intake/Output Summary (Last 24 hours) at 06/23/2022 1250 Last data filed at 06/23/2022 1000 Gross per 24 hour  Intake 562.58 ml  Output 90 ml  Net 472.58 ml   Filed Weights   07/04/2022 0055 06/23/2022 1252  Weight: 85.7 kg 85.9 kg   Weight change: 0.17 kg Body mass index is 28.79 kg/m.   Physical Exam: General exam: Pleasant, elderly male.  Not in distress Skin: No rashes, lesions or ulcers. HEENT: Atraumatic, normocephalic, no obvious bleeding Lungs: Clear to auscultation bilaterally CVS: Regular rate and rhythm, no murmur GI/Abd soft, tenderness present in right upper quadrant at the site of biliary drainage, nondistended, bowel sound present CNS: Alert, awake, oriented to place and person Psychiatry: Mood appropriate Extremities: No pedal edema, no calf tenderness  Data Review: I have personally reviewed the laboratory data and studies available.  F/u labs ordered Unresulted Labs (From admission, onward)     Start     Ordered   06/23/22 1239   Hepatitis B surface antigen  (New Admission Hemo Labs (Hepatitis B))  Once,   R       Question:  Specimen collection method  Answer:  Lab=Lab collect   06/23/22 1239   06/23/22 1239  Hepatitis B surface antibody  (New Admission Hemo Labs (Hepatitis B))  Once,   R       Question:  Specimen collection method  Answer:  Lab=Lab collect   06/23/22 1239   06/23/22 1239  Hepatitis B surface antibody,quantitative  (New Admission Hemo Labs (Hepatitis B))  Once,   R       Question:  Specimen collection method  Answer:  Lab=Lab collect   06/23/22 1239   06/23/22 1239  Hepatitis B core antibody, total  (New Admission Hemo Labs (Hepatitis B))  Once,   R       Question:  Specimen collection method  Answer:  Lab=Lab collect   06/23/22 1239   06/23/22 1239  Hepatitis C antibody  (New Admission Hemo Labs (Hepatitis B))  Once,   R       Question:  Specimen collection method  Answer:  Lab=Lab collect   06/23/22 1239   06/23/22 0500  Procalcitonin  Daily at 5am,   URGENT      07/10/2022 0351   06/23/22 8416  Basic metabolic panel  Daily at 5am,   R      06/19/2022 1251   06/23/22 0500  CBC  Daily at 5am,   R      06/18/2022 1251   06/23/22 0500  Magnesium  Daily at 5am,   R      06/20/2022 1251   06/23/22 0500  Phosphorus  Daily at 5am,   R      06/25/2022 1251   07/09/2022 0107  Urinalysis, Routine w reflex microscopic  Once,   URGENT        06/11/2022 0107            Signed, Terrilee Croak, MD [facility] 06/23/2022

## 2022-06-24 LAB — GLUCOSE, CAPILLARY
Glucose-Capillary: 93 mg/dL (ref 70–99)
Glucose-Capillary: 101 mg/dL — ABNORMAL HIGH (ref 70–99)
Glucose-Capillary: 115 mg/dL — ABNORMAL HIGH (ref 70–99)
Glucose-Capillary: 132 mg/dL — ABNORMAL HIGH (ref 70–99)
Glucose-Capillary: 145 mg/dL — ABNORMAL HIGH (ref 70–99)

## 2022-06-24 LAB — BASIC METABOLIC PANEL
Anion gap: 9 (ref 5–15)
BUN: 28 mg/dL — ABNORMAL HIGH (ref 8–23)
CO2: 29 mmol/L (ref 22–32)
Calcium: 8.5 mg/dL — ABNORMAL LOW (ref 8.9–10.3)
Chloride: 96 mmol/L — ABNORMAL LOW (ref 98–111)
Creatinine, Ser: 4.23 mg/dL — ABNORMAL HIGH (ref 0.61–1.24)
GFR, Estimated: 13 mL/min — ABNORMAL LOW (ref >60)
Glucose, Bld: 102 mg/dL — ABNORMAL HIGH (ref 70–99)
Potassium: 4 mmol/L (ref 3.5–5.1)
Sodium: 134 mmol/L — ABNORMAL LOW (ref 135–145)

## 2022-06-24 LAB — CBC
HCT: 33.3 % — ABNORMAL LOW (ref 39.0–52.0)
Hemoglobin: 10.3 g/dL — ABNORMAL LOW (ref 13.0–17.0)
MCH: 31.4 pg (ref 26.0–34.0)
MCHC: 30.9 g/dL (ref 30.0–36.0)
MCV: 101.5 fL — ABNORMAL HIGH (ref 80.0–100.0)
Platelets: 135 10*3/uL — ABNORMAL LOW (ref 150–400)
RBC: 3.28 MIL/uL — ABNORMAL LOW (ref 4.22–5.81)
RDW: 13.4 % (ref 11.5–15.5)
WBC: 11 10*3/uL — ABNORMAL HIGH (ref 4.0–10.5)
nRBC: 0 % (ref 0.0–0.2)

## 2022-06-24 LAB — PROCALCITONIN: Procalcitonin: 33.92 ng/mL

## 2022-06-24 LAB — PHOSPHORUS: Phosphorus: 3 mg/dL (ref 2.5–4.6)

## 2022-06-24 LAB — MAGNESIUM: Magnesium: 2 mg/dL (ref 1.7–2.4)

## 2022-06-24 NOTE — Consult Note (Signed)
Pharmacy Antibiotic Note  Frank [name] is a 81 y.o. male admitted on 06/16/2022 with  intra-abdominal infection . Pharmacy has been consulted for Zosyn dosing.  Assessment: 81 yo M with PMH ESRD-HD (TTS), anemia of CKD, hypothyroidism, depression, CVA, Afib (on Eliquis) presenting with abdominal pain. CTAP shows cholelithiasis with gallbladder wall thickening and edema suggesting acute cholecystitis. Pt is febrile (Tmax 103), tachypneic, hypotensive, lactic acid 2.5, with low WBC of 3.1. Pt received one-time doses of Zosyn 3.375 g and metronidazole 500 mg in the ED. The one-time dose of metronidazole was because the initial antibiotic plan was cefepime + metronidazole before treatment team changed plan to Zosyn monotherapy. Pt will be admitted to CCU. COVID-19 PCR and Bcx have been collected.  Plan:  Zosyn 2.25 g IV q8H for HD dosing Monitor for signs & symptoms of worsening infection to assess for antibiotic efficacy Follow up culture results to assess for opportunities to narrow therapy  Height: 5\' 8"  (172.7 cm) Weight: 87.8 kg (193 lb 9 oz) IBW/kg (Calculated) : 68.4  Temp (24hrs), Avg:99.1 F (37.3 C), Min:97.9 F (36.6 C), Max:100.1 F (37.8 C)  Recent Labs  Lab 06/17/2022 0110 07/03/2022 0348 07/03/2022 0914 07/01/2022 2012 06/23/22 0507 06/24/22 0538  WBC 3.1*  --   --   --  12.4* 11.0*  CREATININE 4.05*  --   --  5.11* 5.18* 4.23*  LATICACIDVEN  --  2.5* 1.3  --   --   --      Estimated Creatinine Clearance: 15 mL/min (A) (by C-G formula based on SCr of 4.23 mg/dL (H)).    Allergies  Allergen Reactions   Ace Inhibitors Other (See Comments)    Hyperkalemia on ACE INHIBITORS Cr>1.9   Angiotensin Receptor Blockers Other (See Comments)    Hyperkalemia Elevates Cr > 1.9    Ivp Dye [Iodinated Contrast Media] Other (See Comments)    Cannot have due to renal failure   Adhesive [Tape] Other (See Comments)    UNDESCRIBED REACTION Can tolerate ONLY Coban wrap or mild paper  tape!!    Antimicrobials this admission: Zosyn 10/13 >>  Metronidazole 10/13 x 1 (given because initial plan was for cefepime + Flagyl before plan was switched to Zosyn monotherapy)  Dose adjustments this admission: N/A  Microbiology results: 10/13 BCx: collected 10/13 COVID19 PCR: collected 10/13: Bile cx:   Thank you for allowing pharmacy to be a part of this patient's care.  Chinita Greenland PharmD Clinical Pharmacist 06/24/2022

## 2022-06-24 NOTE — Progress Notes (Signed)
Frank [name]  MRN: [id number]  DOB/AGE: 03-17-1941 81 y.o.  Primary Care Physician:Hagan, Edmonia Lynch, NP  Admit date: 06/17/2022  Chief Complaint:  Chief Complaint  Patient presents with   Abdominal Pain    S-Pt presented on  06/14/2022 with  Chief Complaint  Patient presents with   Abdominal Pain  .   Patient is a 81 year old Caucasian male with a past medical history of diabetes mellitus, end-stage renal disease on hemodialysis on Tuesday Thursday Saturday schedule, hypothyroidism, depression, CVA, atrial fibrillation-on chronic anticoagulation therapy, anemia of chronic disease who was brought to the ER from [facility] assisted living with chief complaint of abdominal pain.   Patient was seen today in ICU. Patient offers no new specific physical complaint. Patient informed me that his abdominal pain is now better   Medications   allopurinol  100 mg Oral Daily   ALPRAZolam  0.25 mg Oral Q supper   ALPRAZolam  0.25 mg Oral Q T,Th,Sa-HD   atorvastatin  20 mg Oral Daily   Chlorhexidine Gluconate Cloth  6 each Topical Q0600   Chlorhexidine Gluconate Cloth  6 each Topical Q0600   cholecalciferol  2,000 Units Oral Daily   donepezil  5 mg Oral QHS   insulin aspart  0-6 Units Subcutaneous Q4H   levothyroxine  150 mcg Oral Daily   lidocaine-prilocaine  1 Application Topical See admin instructions   melatonin  10 mg Oral QHS   midodrine  10 mg Oral TID WC   midodrine  5 mg Oral 2 times per day on Tue Thu Sat   [START ON 06/25/2022] neomycin-bacitracin-polymyxin  1 Application Topical Once per day on Mon Thu   sevelamer carbonate  2,400 mg Oral TID WC   sodium chloride flush  5 mL Intracatheter Q8H   tiZANidine  2 mg Oral QHS   traZODone  50 mg Oral QHS   umeclidinium-vilanterol  1 puff Inhalation Daily         YKD:XIPJA from the symptoms mentioned above,there are no other symptoms referable to all systems reviewed.  Physical Exam: Vital signs in last 24  hours: Temp:  [98.2 F (36.8 C)-100.1 F (37.8 C)] 98.7 F (37.1 C) (10/15 0400) Pulse Rate:  [39-134] 78 (10/15 0930) Resp:  [17-36] 21 (10/15 0930) BP: (86-130)/(41-67) 90/52 (10/15 0930) SpO2:  [86 %-100 %] 97 % (10/15 0930) Weight:  [87.8 kg-88.8 kg] 87.8 kg (10/14 2129) Weight change: 2.9 kg Last BM Date :  (PTA)  Intake/Output from previous day: 10/14 0701 - 10/15 0700 In: 1133.4 [I.V.:783.4; IV Piggyback:350] Out: 1180 [Drains:180] No intake/output data recorded.   Physical Exam:  General- pt is awake,alert, oriented to time place and person, Chronically ill-appearing  Resp- No acute REsp distress, Decreased breath sounds at bases  CVS- S1S2 regular in rate and rhythm  GIT- BS+, soft, NT,JP drain in situ  EXT- NO LE Edema, Cyanosis   Access-  AVF positive bruit and thrill  Lab Results:  CBC  Recent Labs    06/23/22 0507 06/24/22 0538  WBC 12.4* 11.0*  HGB 10.3* 10.3*  HCT 33.9* 33.3*  PLT 123* 135*    BMET  Recent Labs    06/23/22 0507 06/24/22 0538  NA 137 134*  K 3.9 4.0  CL 97* 96*  CO2 27 29  GLUCOSE 159* 102*  BUN 37* 28*  CREATININE 5.18* 4.23*  CALCIUM 7.8* 8.5*      Most recent Creatinine trend  Lab Results  Component Value Date   CREATININE  4.23 (H) 06/24/2022   CREATININE 5.18 (H) 06/23/2022   CREATININE 5.11 (H) 06/21/2022      MICRO   Recent Results (from the past 240 hour(s))  Blood culture (routine x 2)     Status: None (Preliminary result)   Collection Time: 07/09/2022  3:48 AM   Specimen: BLOOD  Result Value Ref Range Status   Specimen Description BLOOD LEFT FOREARM  Final   Special Requests   Final    BOTTLES DRAWN AEROBIC AND ANAEROBIC Blood Culture results may not be optimal due to an excessive volume of blood received in culture bottles   Culture   Final    NO GROWTH 2 DAYS Performed at [facility], [address], [place], [place] [address]    Report Status PENDING  Incomplete  Blood  culture (routine x 2)     Status: None (Preliminary result)   Collection Time: 06/21/2022  3:48 AM   Specimen: BLOOD  Result Value Ref Range Status   Specimen Description BLOOD LEFT ASSIST CONTROL  Final   Special Requests   Final    BOTTLES DRAWN AEROBIC AND ANAEROBIC Blood Culture results may not be optimal due to an excessive volume of blood received in culture bottles   Culture   Final    NO GROWTH 2 DAYS Performed at [facility], [address], [place], [place] [address]    Report Status PENDING  Incomplete  SARS Coronavirus 2 by RT PCR (hospital order, performed in [place] hospital lab) *cepheid single result test* Anterior Nasal Swab     Status: None   Collection Time: 06/26/2022 12:07 PM   Specimen: Anterior Nasal Swab  Result Value Ref Range Status   SARS Coronavirus 2 by RT PCR NEGATIVE NEGATIVE Final    Comment: (NOTE) SARS-CoV-2 target nucleic acids are NOT DETECTED.  The SARS-CoV-2 RNA is generally detectable in upper and lower respiratory specimens during the acute phase of infection. The lowest concentration of SARS-CoV-2 viral copies this assay can detect is 250 copies / mL. A negative result does not preclude SARS-CoV-2 infection and should not be used as the sole basis for treatment or other patient management decisions.  A negative result may occur with improper specimen collection / handling, submission of specimen other than nasopharyngeal swab, presence of viral mutation(s) within the areas targeted by this assay, and inadequate number of viral copies (<250 copies / mL). A negative result must be combined with clinical observations, patient history, and epidemiological information.  Fact Sheet for Patients:   https://www.patel.info/  Fact Sheet for Healthcare Providers: https://hall.com/  This test is not yet approved or  cleared by the [place] FDA and has been authorized for detection and/or  diagnosis of SARS-CoV-2 by FDA under an Emergency Use Authorization (EUA).  This EUA will remain in effect (meaning this test can be used) for the duration of the COVID-19 declaration under Section 564(b)(1) of the Act, 21 U.S.C. section 360bbb-3(b)(1), unless the authorization is terminated or revoked sooner.  Performed at [facility], [address] [place], [place] [address]   MRSA Next Gen by PCR, Nasal     Status: None   Collection Time: 06/17/2022 12:58 PM   Specimen: Nasal Mucosa; Nasal Swab  Result Value Ref Range Status   MRSA by PCR Next Gen NOT DETECTED NOT DETECTED Final    Comment: (NOTE) The GeneXpert MRSA Assay (FDA approved for NASAL specimens only), is one component of a comprehensive MRSA colonization surveillance program.  It is not intended to diagnose MRSA infection nor to guide or monitor treatment for MRSA infections. Test performance is not FDA approved in patients less than 83 years old. Performed at [facility], [address] [place], [place] [address]   Aerobic/Anaerobic Culture w Gram Stain (surgical/deep wound)     Status: None (Preliminary result)   Collection Time: 07/09/2022  2:43 PM   Specimen: BILE  Result Value Ref Range Status   Specimen Description   Final    BILE Performed at [facility], [address] [place], [place] [address]    Special Requests   Final    NONE Performed at [facility], [address] [place], [place] [address]    Gram Stain PENDING  Incomplete   Culture FEW KLEBSIELLA OXYTOCA  Final   Report Status PENDING  Incomplete         Impression:   1)Renal     End-stage renal disease Patient is on hemodialysis Patient is on Tuesday Thursday Saturday schedule Patient undergoes dialysis at [facility] under the care of Dr. Graylon Gunning Patient was last dialyzed yesterday-Saturday No need for renal placement therapy today     2)Hypotension Patient  is on midodrine    3)Anemia of chronic disease  HGb at goal (9--11)     3)Anemia of chronic disease     Latest Ref Rng & Units 06/24/2022    5:38 AM 06/23/2022    5:07 AM 06/17/2022    1:10 AM  CBC  WBC 4.0 - 10.5 K/uL 11.0  12.4  3.1   Hemoglobin 13.0 - 17.0 g/dL 10.3  10.3  13.0   Hematocrit 39.0 - 52.0 % 33.3  33.9  43.5   Platelets 150 - 400 K/uL 135  123  153        HGb at goal (9--11)   4) Secondary hyperparathyroidism -CKD Mineral-Bone Disorder    Lab Results  Component Value Date   PTH 184 (H) 01/23/2019   CALCIUM 8.5 (L) 06/24/2022   CAION 1.15 10/27/2021   PHOS 3.0 06/24/2022    Secondary Hyperparathyroidism present   Phosphorus at goal.   5)Acute cholecystitis Patient is being followed by primary team, surgery team Patient is to have percutaneous cholecystostomy tube   6) Electrolytes      Latest Ref Rng & Units 06/24/2022    5:38 AM 06/23/2022    5:07 AM 06/13/2022    8:12 PM  BMP  Glucose 70 - 99 mg/dL 102  159  121   BUN 8 - 23 mg/dL 28  37  33   Creatinine 0.61 - 1.24 mg/dL 4.23  5.18  5.11   Sodium 135 - 145 mmol/L 134  137  136   Potassium 3.5 - 5.1 mmol/L 4.0  3.9  4.0   Chloride 98 - 111 mmol/L 96  97  97   CO2 22 - 32 mmol/L 29  27  28    Calcium 8.9 - 10.3 mg/dL 8.5  7.8  8.2      Sodium Hyponatremia Patient sodium is low secondary to ESRD/inability to get rid of free water   Potassium Normokalemic    7)Acid base  Co2 at goal     Plan:    No need for renal placement therapy today    Tighe Gitto s Joylyn Duggin 06/24/2022, 9:49 AM

## 2022-06-24 NOTE — Evaluation (Signed)
Physical Therapy Evaluation Patient Details Name: Frank [name] MRN: [id number] DOB: 03-29-1941 Today's Date: 06/24/2022  History of Present Illness  Pt is an 81 y/o M admitted on 06/11/2022 after presenting with a 24 hr hx of RUQ abdominal pain, N&V, fevers & chills. CT Abd/Pelvis concerning for cholelithiasis with gallbladder wall thickening and edema suggesting acute cholecystitis. It was recommended pt have cholecystostomy tube placement. PMH: DM2, neuropathy, HTN, HLD, PVD, stroke, 2nd degree heart block  Clinical Impression  Pt seen for PT evaluation with pt agreeable but perseverative on NPO situation & requires max cuing throughout session to focus on task at hand. Pt reports he lives at [facility] where he is ambulatory without assistance with rollator. On this date, pt requires mod/max assist for supine>sit with HOB elevated & bed rails as pt is limited by abdominal pain. Pt completes STS & step pivot to recliner with RW & CGA<>Min assist. Pt declines further standing, ambulation at this time. Will continue to follow pt acutely to address balance, strengthening, and gait with LRAD. At this time, will recommend STR upon d/c but will continue to follow pt acutely in hopes that pt will improve in regards to functional mobility & can d/c to his ALF with HHPT.   BP checked in LUE: Sitting EOB: 95/41 mmHg MAP 56, pt c/o lightheadedness but given prolonged sitting break & once symptoms improved pt transferred to recliner Sitting in recliner: 103/52 mmHg MAP 65     Recommendations for follow up therapy are one component of a multi-disciplinary discharge planning process, led by the attending physician.  Recommendations may be updated based on patient status, additional functional criteria and insurance authorization.  Follow Up Recommendations Skilled nursing-short term rehab (<3 hours/day) Can patient physically be transported by private vehicle: Yes    Assistance Recommended at Discharge  Intermittent Supervision/Assistance  Patient can return home with the following  A little help with walking and/or transfers;A little help with bathing/dressing/bathroom;Assist for transportation    Equipment Recommendations Rolling walker (2 wheels)  Recommendations for Other Services       Functional Status Assessment Patient has had a recent decline in their functional status and demonstrates the ability to make significant improvements in function in a reasonable and predictable amount of time.     Precautions / Restrictions Precautions Precautions: Fall Precaution Comments: RUQ drain Restrictions Weight Bearing Restrictions: No      Mobility  Bed Mobility Overal bed mobility: Needs Assistance Bed Mobility: Supine to Sit     Supine to sit: HOB elevated, Max assist, Mod assist     General bed mobility comments: cuing to utilize bed rails, attempted to educate pt on rolling to decrease strain on abdomen but difficulty following cuing, pt attempts to instruct PT to help him move his legs but PT educates him on need to attempt on his own first, pt requires assistance to upright trunk, assistance to scoot/turn to square up on EOB    Transfers Overall transfer level: Needs assistance Equipment used: Rolling walker (2 wheels) Transfers: Sit to/from Stand, Bed to chair/wheelchair/BSC Sit to Stand: Min assist, Min guard (cuing re: safe hand placement, STS from elevated EOB as pt is tall)   Step pivot transfers: Min assist (step pivot bed>recliner on L)            Ambulation/Gait                  Stairs  Wheelchair Mobility    Modified Rankin (Stroke Patients Only)       Balance Overall balance assessment: Needs assistance Sitting-balance support: Feet supported, Bilateral upper extremity supported Sitting balance-Leahy Scale: Good     Standing balance support: During functional activity, Bilateral upper extremity supported Standing  balance-Leahy Scale: Fair                               Pertinent Vitals/Pain Pain Assessment Pain Assessment: Faces Faces Pain Scale: Hurts even more Pain Location: abdomen/RUE/drain Pain Descriptors / Indicators: Discomfort, Grimacing Pain Intervention(s): Monitored during session, Repositioned    Home Living Family/patient expects to be discharged to:: Assisted living                 Home Equipment: Rollator (4 wheels) Additional Comments: [facility]    Prior Function Prior Level of Function : Independent/Modified Independent             Mobility Comments: Pt reports he's mod I with rollator, can get OOB & to the dining room without assistance. ADLs Comments: Pt reports he toilets without assistance.     Hand Dominance        Extremity/Trunk Assessment   Upper Extremity Assessment Upper Extremity Assessment: Generalized weakness    Lower Extremity Assessment Lower Extremity Assessment: Generalized weakness (LLE 3-/5 knee extension, RLE 3/5 knee extension)    Cervical / Trunk Assessment Cervical / Trunk Assessment: Kyphotic (forward head)  Communication   Communication:  (pt with stutter but reports this is chronic 2/2 old stroke)  Cognition Arousal/Alertness: Awake/alert Behavior During Therapy: WFL for tasks assessed/performed Overall Cognitive Status: Within Functional Limits for tasks assessed                                 General Comments: Pt perseverative on issue re: NPO status & awaiting SLP evaluation, pt with poor carryover, requiring education re: situation multiple times throughout session from nurse & PT. Pt requires frequent, ongoing cuing to focus on task at hand. Pt follows simple commands with exra time.        General Comments General comments (skin integrity, edema, etc.): Pt received on 2L/min via nasal cannula, SpO2 >90%. Pt placed on room air & SpO2 dropped to 86% with not able to recover to >/=  90% (pt with difficulty taking deep breaths 2/2 abdomnial pain, also talking throughout despite cuing to focus on breathing) so pt placed back on 2L/min with pt able to recover >/=90%    Exercises     Assessment/Plan    PT Assessment Patient needs continued PT services  PT Problem List Decreased strength;Pain;Cardiopulmonary status limiting activity;Decreased activity tolerance;Decreased knowledge of use of DME;Decreased safety awareness;Decreased balance;Decreased mobility       PT Treatment Interventions DME instruction;Therapeutic exercise;Gait training;Balance training;Neuromuscular re-education;Functional mobility training;Therapeutic activities;Patient/family education;Manual techniques;Modalities    PT Goals (Current goals can be found in the Care Plan section)  Acute Rehab PT Goals Patient Stated Goal: to eat chicken broth PT Goal Formulation: With patient Time For Goal Achievement: 07/08/22 Potential to Achieve Goals: Good    Frequency Min 2X/week     Co-evaluation               AM-PAC PT "6 Clicks" Mobility  Outcome Measure Help needed turning from your back to your side while in a flat bed without using bedrails?: A Lot  Help needed moving from lying on your back to sitting on the side of a flat bed without using bedrails?: A Lot Help needed moving to and from a bed to a chair (including a wheelchair)?: A Little Help needed standing up from a chair using your arms (e.g., wheelchair or bedside chair)?: A Little Help needed to walk in hospital room?: A Little Help needed climbing 3-5 steps with a railing? : A Lot 6 Click Score: 15    End of Session Equipment Utilized During Treatment: Oxygen Activity Tolerance: Patient tolerated treatment well;Patient limited by pain Patient left: in chair;with call bell/phone within reach Nurse Communication: Mobility status PT Visit Diagnosis: Muscle weakness (generalized) (M62.81);Difficulty in walking, not elsewhere  classified (R26.2);Pain Pain - Right/Left: Right Pain - part of body:  (abdomen)    Time: 5396-7289 PT Time Calculation (min) (ACUTE ONLY): 25 min   Charges:   PT Evaluation $PT Eval Moderate Complexity: [place], PT, DPT 06/24/22, 9:23 AM   Waunita Schooner 06/24/2022, 9:19 AM

## 2022-06-24 NOTE — Evaluation (Signed)
Clinical/Bedside Swallow Evaluation Patient Details  Name: Frank [name] MRN: [id number] Date of Birth: [birth year]  Today's Date: 06/24/2022 Time: SLP Start Time (ACUTE ONLY): 8416 SLP Stop Time (ACUTE ONLY): 6063 SLP Time Calculation (min) (ACUTE ONLY): 18 min  Past Medical History:  Past Medical History:  Diagnosis Date   Anemia of chronic kidney failure    Arthritis    Benign prostate hyperplasia    Bradycardia    Cataracts, bilateral 05/15/2012   Chronic idiopathic gout of multiple sites 02/10/2015   Chronic kidney disease    [facility]- ESRD   Depression    Diabetes mellitus without complication ([place])    diet controlled    Diabetic nephropathy ([place])    ED (erectile dysfunction) 08/28/2012   Elevated liver enzymes 06/10/2019   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 HTN PLAN   BP goal is <140/90 BP Readings from Last 3 Encounters:  08/27/16 140/79  08/13/16 141/80  08/06/16 144/86   Current Anti-Hyptensive Medications: None  Today's Recommendations: Continue lifestyle modifications. Defer starting medication to PCP.  Recommend continued regular exercise as tolerated. Recomm   History of UTI    s/p foley in hospital 01/2019    Hyperlipidemia    Hypothyroidism    Illiterate 11/19/2013   Last Assessment & Plan:  Assisted patient with Handicapped Placard application today.     Insomnia 05/08/2019   Neuropathy    Peripheral neuropathy    Peripheral vascular disease ([place])    Second degree heart block    Secondary hyperparathyroidism ([place])    Renal   Stroke ([place]) 2021   Thyroid disease    Past Surgical History:  Past Surgical History:  Procedure Laterality Date   AV FISTULA PLACEMENT Left 11/19/2017   Procedure: ARTERIOVENOUS (AV) FISTULA CREATION;  Surgeon: [name], MD;  Location: [place];  Service: Vascular;  Laterality: Left;   AV FISTULA PLACEMENT Right 10/27/2021   Procedure: CREATION OF BRACHIOCEPHALIC RIGHT ARM ARTERIOVENOUS (AV) FISTULA;  Surgeon: [name], MD;  Location: [place];  Service: Vascular;  Laterality: Right;   [place] Left 06/04/2018   Procedure: BASILIC VEIN TRANSPOSITION ARM;  Surgeon: [name], MD;  Location: [place];  Service: Vascular;  Laterality: Left;   COLONOSCOPY     DIALYSIS/PERMA CATHETER REMOVAL N/A 03/23/2019   Procedure: DIALYSIS/PERMA CATHETER REMOVAL;  Surgeon: [name], MD;  Location: [place] [facility];  Service: Cardiovascular;  Laterality: N/A;   EYE SURGERY     cataract surgery b/l; photophobia since    INSERTION OF DIALYSIS CATHETER N/A 01/23/2019   Procedure: INSERTION OF DIALYSIS CATHETER;  Surgeon: [name], MD;  Location: [facility] OR;  Service: Vascular;  Laterality: N/A;  INSERTION OF DIALYSIS CATHETER   INSERTION OF DIALYSIS CATHETER  2022   right subclavian (chest area)   IR ANGIO INTRA EXTRACRAN SEL COM CAROTID INNOMINATE UNI L MOD SED  10/15/2019   IR ANGIO VERTEBRAL SEL SUBCLAVIAN INNOMINATE UNI R MOD SED  10/15/2019   IR CT HEAD LTD  10/15/2019   IR FLUORO GUIDE CV LINE RIGHT  10/08/2019   IR INTRAVSC STENT CERV CAROTID W/EMB-PROT MOD SED INCL ANGIO  10/15/2019   IR PERC CHOLECYSTOSTOMY  07/04/2022   IR THROMBECTOMY AV FISTULA W/THROMBOLYSIS/PTA INC/SHUNT/IMG LEFT Left 10/09/2019  IR US GUIDE VASC ACCESS LEFT  10/09/2019   IR US GUIDE VASC ACCESS RIGHT  10/08/2019   KNEE ARTHROSCOPY Left    Knee   PERIPHERAL VASCULAR THROMBECTOMY Left 07/06/2019   Procedure: PERIPHERAL VASCULAR THROMBECTOMY;  Surgeon: [name], MD;  Location: [place] [facility];  Service: Cardiovascular;  Laterality: Left;   RADIOLOGY WITH ANESTHESIA N/A 10/15/2019   Procedure: stent placement;  Surgeon: Luanne Bras, MD;  Location: [place];  Service: Radiology;  Laterality: N/A;    THROMBECTOMY W/ EMBOLECTOMY Left 01/27/2019   Procedure: THROMBECTOMY Left arm ARTERIOVENOUS FISTULA  and Left arm Arteriovenous gortex graft;  Surgeon: [name], MD;  Location: [facility] OR;  Service: Vascular;  Laterality: Left;   VENOGRAM Left 01/27/2019   Procedure: Left arm FISTULOGRAM/;  Surgeon: [name], MD;  Location: [place];  Service: Vascular;  Laterality: Left;   HPI:  ROHIN KREJCI is a 81 y.o. male with PMH significant for ESRD-HD-TTS, DM2, HTN, HLD, CAD, A-fib on Eliquis, CVA, PAD, chronic anemia, hypothyroidism, depression, BPH, gout who lives at [facility].  10/13, patient presented to the ED for evaluation of epigastric, right upper quadrant pain, stated with nausea, vomiting.  In the ED, patient had temperature of 103, heart rate 98, blood pressure in 90s which later dropped down to 80s.  Initially on room air but later required up to 4 L of oxygen. CT abdomen showed cholelithiasis with gallbladder wall thickening and edema suggesting acute cholecystitis. Percutaneous cholecystostomy tube placement by IR on 07/04/2022. CXR 10/13: Bibasilar atelectasis. Repeat CXR 06/23/2022 Mild interval worsening in lung aeration. Specifically, opacity  in the right lung base has increased. This is suspected to be a  combination pleural fluid and atelectasis and/or pneumonia.    Assessment / Plan / Recommendation  Clinical Impression  Pt presents with adequate oropharyngeal abilities when consuming thin liquids via straw, puree and graham crackers. Of note, pt utilizes large bits/sips and fast rate of consumption which is baseline. Despite this, pt appears at reduced risk of aspiration when consuming regular diet with thin liquids via cup or straw and medicine whole with thin liquids. Pt is perseverative and difficult to redirect to task at hand, as such, recommend administering medicines in more approachable friendly way towards pt. Skilled ST intervention is not indicated  at this time. SLP Visit Diagnosis: Dysphagia, unspecified (R13.10)    Aspiration Risk  Mild aspiration risk    Diet Recommendation Regular;Thin liquid   Liquid Administration via: Cup;Straw Medication Administration: Whole meds with liquid Supervision: Patient able to self feed Compensations: Minimize environmental distractions;Slow rate;Small sips/bites Postural Changes: Seated upright at 90 degrees;Remain upright for at least 30 minutes after po intake    Other  Recommendations Oral Care Recommendations: Oral care BID    Recommendations for follow up therapy are one component of a multi-disciplinary discharge planning process, led by the attending physician.  Recommendations may be updated based on patient status, additional functional criteria and insurance authorization.  Follow up Recommendations No SLP follow up      Assistance Recommended at Discharge None  Functional Status Assessment Patient has not had a recent decline in their functional status    Swallow Study   General Date of Onset: 06/23/22 HPI: Frank [name] is a 81 y.o. male with PMH significant for ESRD-HD-TTS, DM2, HTN, HLD, CAD, A-fib on Eliquis, CVA, PAD, chronic anemia, hypothyroidism, depression, BPH, gout who lives at [facility].  10/13, patient presented to the  ED for evaluation of epigastric, right upper quadrant pain, stated with nausea, vomiting.  In the ED, patient had temperature of 103, heart rate 98, blood pressure in 90s which later dropped down to 80s.  Initially on room air but later required up to 4 L of oxygen. CT abdomen showed cholelithiasis with gallbladder wall thickening and edema suggesting acute cholecystitis. Percutaneous cholecystostomy tube placement by IR on 06/21/2022. CXR 10/13: Bibasilar atelectasis. Repeat CXR 06/23/2022 Mild interval worsening in lung aeration. Specifically, opacity  in the right lung base has increased. This is suspected to be a  combination pleural  fluid and atelectasis and/or pneumonia. Type of Study: Bedside Swallow Evaluation Previous Swallow Assessment: 10/2019; discharged on regular with thin liquids Diet Prior to this Study: NPO Temperature Spikes Noted: No Respiratory Status: Room air History of Recent Intubation: No Behavior/Cognition: Alert;Cooperative;Pleasant mood;Requires cueing;Doesn't follow directions;Distractible Oral Cavity Assessment: Within Functional Limits Oral Care Completed by SLP: Recent completion by staff Oral Cavity - Dentition: Dentures, top;Dentures, bottom Vision: Functional for self-feeding Self-Feeding Abilities: Able to feed self Patient Positioning: Upright in chair Baseline Vocal Quality: Normal Volitional Cough: Strong Volitional Swallow: Able to elicit    Oral/Motor/Sensory Function Overall Oral Motor/Sensory Function: Within functional limits   Ice Chips Ice chips: Not tested   Thin Liquid Thin Liquid: Within functional limits Presentation: Self Fed;Straw    Nectar Thick Nectar Thick Liquid: Not tested   Honey Thick Honey Thick Liquid: Not tested   Puree Puree: Within functional limits Presentation: Self Fed;Spoon   Solid     Solid: Within functional limits Presentation: Self Fed     Aeliana Spates B. Rutherford Nail, M.S., CCC-SLP, Mining engineer Certified Brain Injury [place]  [place] Office [phone] Ascom [phone] Fax [phone]

## 2022-06-24 NOTE — Progress Notes (Signed)
NAME:  Frank [name], MRN:  [id number], DOB:  February 19, 1941, LOS: 2 ADMISSION DATE:  06/16/2022, CONSULTATION DATE: 07/05/2022 REFERRING MD: Dr. Francine Graven, CHIEF COMPLAINT: Abdominal Pain  History of Present Illness:  This is an 81 yo male who presented to [facility] ER on 10/13 with a 24 hr hx of RUQ abdominal pain, nausea, and vomiting.  He also endorsed subjective fevers and chills.    ED Course  Upon arrival to the ER lab results revealed K+ 3.2/chloride 94/ glucose 163/creatinine 4.05/calcium 8.7/anion gap 19/troponin 49/lactic acid 2.5/pct 6.51/wbc 3.1/PT 17.3/INR 1.4.  CT Abd/Pelvis concerning for cholelithiasis with gallbladder wall thickening and edema suggesting acute cholecystitis.  Pt received zosyn.  General surgery consulted by ER provider and IR consulted per recommendations for cholecystostomy tube placement.  PT admitted to the stepdown unit per hospitalist team for additional workup and treatment.    Initial ER vital signs: temp 100 F, bp 95/49/hr 88/resp rate 18/O2 sats 97% on RA Initial EKG: Atrial fibrillation with PVC's and RBBB, hr 91 CT Abd Pelvis 10/13: Cholelithiasis with gallbladder wall thickening and edema suggesting acute cholecystitis. Small amount of free fluid in the pelvis. Increased density in the fluid may indicate hemorrhagic fluid or proteinaceous fluid. Source is not identified. Prostate gland enlargement. Bladder wall thickening likely due to outlet obstruction but could indicate cystitis. Small right pleural effusion with basilar atelectasis or consolidation.  CXR 10/13: Bibasilar atelectasis   Despite 2L fluid bolus bp's remained soft PCCM team consulted for possible need of vasopressors.    06/23/22- patient seen and examined at bedside.  He remains critically ill on levophed with chronic aphasic speech post previous stroke.  He still has active return form GB drain but seems clinically improved. He reports ongoing RUQ pain and tenderness.   He is for HD today.    06/24/22- patient is mildly improved, he is more awake and interactive.  He is asking if he can eat and surgery has cleared him to start PO.  He is for swallow evaluation today, hx of CVA.  Ongoing active bilious return from cholecystostomy drain. Still has RUQ pain/tenderness but improved.  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     Pertinent  Medical History  Anemia of CKD Arthritis BPH Bradycardia  Bilateral Cataracts  Type II Diabetes Mellitus Diabetic Nephropathy Erectile Dysfunction Essential HTN Elevated Liver Enzymes  UTI HLD Hypothyroidism [place]ia Illiterate Peripheral Neuropathy  PVD Second Degree Heart Block Stroke  Significant Hospital Events: Including procedures, antibiotic start and stop dates in addition to other pertinent events   10/13: Admitted with sepsis secondary to acute cholecystitis IR consulted for cholecystostomy tube placement.  PCCM team consulted due to soft bp's may require vasopressors   Interim History / Subjective:  Pt currently not on vasopressors bp 90/51 with map of 64  Objective   Blood pressure (!) 90/52, pulse 78, temperature 98.7 F (37.1 C), temperature source Oral, resp. rate (!) 21, height 5\' 8"  (1.727 m), weight 87.8 kg, SpO2 97 %.        Intake/Output Summary (Last 24 hours) at 06/24/2022 1026 Last data filed at 06/24/2022 0600 Gross per 24 hour  Intake 1065.64 ml  Output 1180 ml  Net -114.36 ml    Filed Weights   07/09/2022 1252 06/23/22 1821 06/23/22 2129  Weight: 85.9 kg 88.8 kg 87.8 kg    Examination: General: Acute on chronically-ill appearing male, NAD on 3L O2  HENT: Supple, no JVD  Lungs: Clear throughout, even, non  labored  Cardiovascular: Irregular irregular, no r/g, 2+ radial/1+ distal pulses  Abdomen: +BS x4, obese, tenderness present Extremities: Moves all extremities; RUE fistula +bruit/thrill Neuro: Alert and oriented, follows commands, PERRLA  GU: Deferred   Resolved Hospital Problem  list     Assessment & Plan:   Sepsis secondary to cholelithiasis  - Trend WBC and monitor fever curve  - Trend PCT  - Follow cultures  - Continue zosyn - IR consulted for cholecystostomy tube placement  Hypotension secondary to sepsis  - Continuous telemetry monitoring  - Gentle iv fluid resuscitation and prn levophed gtt to maintain map >60 and/or sbp 90 - Continue midodrine   Mildly elevated troponin likely demand ischemia in the setting of sepsis  Hx: Essential HTN, HLD, PVD, and Stroke  - Trend until troponin peaked  - Continue atorvastatin  - Hold outpatient apixaban for procedure    ESRD on HD  Lactic acidosis  Hypokalemia  - Trend BMP and lactic acid  - Replace electrolytes as indicated  - Avoid nephrotoxic medications   Type II diabetes mellitus  - CBG's q4hrs - SSI - Target range 140-180  Hypothyroidism - Continue outpatient synthroid  Best Practice (right click and "Reselect all SmartList Selections" daily)   Diet/type: NPO DVT prophylaxis: SCD GI prophylaxis: N/A Lines: N/A Foley:  N/A Code Status:  full code Last date of multidisciplinary goals of care discussion [N/A]  Labs   CBC: Recent Labs  Lab 07/08/2022 0110 06/23/22 0507 06/24/22 0538  WBC 3.1* 12.4* 11.0*  HGB 13.0 10.3* 10.3*  HCT 43.5 33.9* 33.3*  MCV 100.9* 99.7 101.5*  PLT 153 123* 135*     Basic Metabolic Panel: Recent Labs  Lab 07/07/2022 0110 06/16/2022 2012 06/23/22 0507 06/24/22 0538  NA 138 136 137 134*  K 3.2* 4.0 3.9 4.0  CL 94* 97* 97* 96*  CO2 25 28 27 29   GLUCOSE 163* 121* 159* 102*  BUN 22 33* 37* 28*  CREATININE 4.05* 5.11* 5.18* 4.23*  CALCIUM 8.7* 8.2* 7.8* 8.5*  MG  --  1.8 1.8 2.0  PHOS  --   --  3.9 3.0    GFR: Estimated Creatinine Clearance: 15 mL/min (A) (by C-G formula based on SCr of 4.23 mg/dL (H)). Recent Labs  Lab 06/21/2022 0110 07/06/2022 0348 06/23/2022 0914 06/23/22 0507 06/24/22 0538  PROCALCITON  --  6.51  --  35.56 33.92  WBC 3.1*   --   --  12.4* 11.0*  LATICACIDVEN  --  2.5* 1.3  --   --      Liver Function Tests: Recent Labs  Lab 07/05/2022 0110  AST 23  ALT 9  ALKPHOS 78  BILITOT 1.2  PROT 7.6  ALBUMIN 4.0    Recent Labs  Lab 06/24/2022 0110  LIPASE 38    No results for input(s): "AMMONIA" in the last 168 hours.  ABG    Component Value Date/Time   TCO2 31 10/27/2021 0811     Coagulation Profile: Recent Labs  Lab 06/21/2022 0348 06/23/22 0507  INR 1.4* 1.9*     Cardiac Enzymes: No results for input(s): "CKTOTAL", "CKMB", "CKMBINDEX", "TROPONINI" in the last 168 hours.  HbA1C: Hgb A1c MFr Bld  Date/Time Value Ref Range Status  07/09/2022 01:10 AM 5.2 4.8 - 5.6 % Final    Comment:    (NOTE) Pre diabetes:          5.7%-6.4%  Diabetes:              >  6.4%  Glycemic control for   <7.0% adults with diabetes   10/08/2019 03:16 AM 6.7 (H) 4.8 - 5.6 % Final    Comment:    (NOTE) Pre diabetes:          5.7%-6.4% Diabetes:              >6.4% Glycemic control for   <7.0% adults with diabetes     CBG: Recent Labs  Lab 06/23/22 1523 06/23/22 1917 06/23/22 2325 06/24/22 0340 06/24/22 0732  GLUCAP 143* 103* 95 93 101*     Review of Systems: Positives in BOLD  Gen: Denies fever, chills, weight change, fatigue, night sweats HEENT: Denies blurred vision, double vision, hearing loss, tinnitus, sinus congestion, rhinorrhea, sore throat, neck stiffness, dysphagia PULM: Denies shortness of breath, cough, sputum production, hemoptysis, wheezing CV: Denies chest pain, edema, orthopnea, paroxysmal nocturnal dyspnea, palpitations GI: abdominal pain, nausea, vomiting, diarrhea, hematochezia, melena, constipation, change in bowel habits GU: Denies dysuria, hematuria, polyuria, oliguria, urethral discharge Endocrine: Denies hot or cold intolerance, polyuria, polyphagia or appetite change Derm: Denies rash, dry skin, scaling or peeling skin change Heme: Denies easy bruising, bleeding, bleeding  gums Neuro: Denies headache, numbness, weakness, slurred speech, loss of memory or consciousness   Past Medical History:  He,  has a past medical history of Anemia of chronic kidney failure, Arthritis, Benign prostate hyperplasia, Bradycardia, Cataracts, bilateral (05/15/2012), 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   Surgical History:   Past Surgical History:  Procedure Laterality Date   AV FISTULA PLACEMENT Left 11/19/2017   Procedure: ARTERIOVENOUS (AV) FISTULA CREATION;  Surgeon: [name], MD;  Location: [facility];  Service: Vascular;  Laterality: Left;   AV FISTULA PLACEMENT Right 10/27/2021   Procedure: CREATION OF BRACHIOCEPHALIC RIGHT ARM ARTERIOVENOUS (AV) FISTULA;  Surgeon: [name], MD;  Location: [place];  Service: Vascular;  Laterality: Right;   [place] Left 06/04/2018   Procedure: BASILIC VEIN TRANSPOSITION ARM;  Surgeon: [name], MD;  Location: [place];  Service: Vascular;  Laterality: Left;   COLONOSCOPY     DIALYSIS/PERMA CATHETER REMOVAL N/A 03/23/2019   Procedure: DIALYSIS/PERMA CATHETER REMOVAL;  Surgeon: [name], MD;  Location: [place] [facility];  Service: Cardiovascular;  Laterality: N/A;   EYE SURGERY     cataract surgery b/l; photophobia since    INSERTION OF DIALYSIS CATHETER N/A 01/23/2019   Procedure: INSERTION OF DIALYSIS CATHETER;  Surgeon:  [name], MD;  Location: [facility];  Service: Vascular;  Laterality: N/A;  INSERTION OF DIALYSIS CATHETER   INSERTION OF DIALYSIS CATHETER  2022   right subclavian (chest area)   IR ANGIO INTRA EXTRACRAN SEL COM CAROTID INNOMINATE UNI L MOD SED  10/15/2019   IR ANGIO VERTEBRAL SEL SUBCLAVIAN INNOMINATE UNI R MOD SED  10/15/2019   IR CT HEAD LTD  10/15/2019   IR FLUORO GUIDE CV LINE RIGHT  10/08/2019   IR INTRAVSC STENT CERV CAROTID W/EMB-PROT MOD SED INCL ANGIO  10/15/2019   IR PERC CHOLECYSTOSTOMY  06/14/2022   IR THROMBECTOMY AV FISTULA W/THROMBOLYSIS/PTA INC/SHUNT/IMG LEFT Left 10/09/2019   IR US GUIDE VASC ACCESS  LEFT  10/09/2019   IR US GUIDE VASC ACCESS RIGHT  10/08/2019   KNEE ARTHROSCOPY Left    Knee   PERIPHERAL VASCULAR THROMBECTOMY Left 07/06/2019   Procedure: PERIPHERAL VASCULAR THROMBECTOMY;  Surgeon: [name], MD;  Location: [place];  Service: Cardiovascular;  Laterality: Left;   RADIOLOGY WITH ANESTHESIA N/A 10/15/2019   Procedure: stent placement;  Surgeon: Luanne Bras, MD;  Location: [place];  Service: Radiology;  Laterality: N/A;   THROMBECTOMY W/ EMBOLECTOMY Left 01/27/2019   Procedure: THROMBECTOMY Left arm ARTERIOVENOUS FISTULA  and Left arm Arteriovenous gortex graft;  Surgeon: [name], MD;  Location: [facility] OR;  Service: Vascular;  Laterality: Left;   VENOGRAM Left 01/27/2019   Procedure: Left arm FISTULOGRAM/;  Surgeon: [name], MD;  Location: [place];  Service: Vascular;  Laterality: Left;     Social History:   reports that he quit smoking about 29 years ago. His smoking use included cigarettes. He has never used smokeless tobacco. He reports that he does not drink alcohol and does not use drugs.   Family History:  His family history includes Diabetes in his mother; Heart Problems in his mother; Kidney disease in his mother; Liver disease in his mother. There is no history of Stroke.   Allergies Allergies  Allergen  Reactions   Ace Inhibitors Other (See Comments)    Hyperkalemia on ACE INHIBITORS Cr>1.9   Angiotensin Receptor Blockers Other (See Comments)    Hyperkalemia Elevates Cr > 1.9    Ivp Dye [Iodinated Contrast Media] Other (See Comments)    Cannot have due to renal failure   Adhesive [Tape] Other (See Comments)    UNDESCRIBED REACTION Can tolerate ONLY Coban wrap or mild paper tape!!     Home Medications  Prior to Admission medications   Medication Sig Start Date End Date Taking? Authorizing Provider  ALPRAZolam Duanne Moron) 0.25 MG tablet Take 0.25 mg by mouth daily with supper. 01/11/20  Yes [provider]  apixaban (ELIQUIS) 2.5 MG TABS tablet TAKE ONE TABLET BY MOUTH TWICE A DAY. **THIS MEDICINE THINS THE BLOOD** 05/02/22  Yes Shirley Friar, PA-C  Cholecalciferol (VITAMIN D) 50 MCG (2000 UT) tablet Take 2,000 Units by mouth daily. 01/24/21  Yes [provider]  donepezil (ARICEPT) 5 MG tablet Take 1 tablet (5 mg total) by mouth at bedtime. 12/23/19  Yes Melvenia Beam, MD  lidocaine-prilocaine (EMLA) cream Apply 1 application topically See admin instructions. Apply to access site 1 to 2 hours before dialysis. Cover with occlusive dressing.On Tuesday,Thursday and Saturday 04/08/19  Yes [provider]  Melatonin 10 MG TABS Take 10 mg by mouth at bedtime.   Yes [provider]  midodrine (PROAMATINE) 10 MG tablet Take 1 tablet (10 mg total) by mouth 3 (three) times daily with meals. Patient taking differently: Take 10 mg by mouth 3 (three) times daily with meals. Use as directed 12/14/19  Yes Tillery, Satira Mccallum, PA-C  midodrine (PROAMATINE) 5 MG tablet 5 mg. Take 5 mg by mouth at 10:30 and 1 pm on Dialysis days   Yes [provider]  neomycin-bacitracin-polymyxin (NEOSPORIN) [phone] ointment Apply 1 Application topically 2 (two) times a week.   Yes [provider]  pantoprazole (PROTONIX) 40 MG tablet Take 1 tablet (40 mg total)  by mouth daily. Patient taking differently: Take 40 mg by mouth daily before breakfast. 10/23/19 07/05/2022 Yes Regalado, Belkys A, MD  sevelamer carbonate (RENVELA) 800 MG tablet Take 3 tablets (  2,400 mg total) by mouth 3 (three) times daily with meals. Patient taking differently: Take 2,400 mg by mouth 3 (three) times daily with meals. Use as directed 10/23/19  Yes Regalado, Belkys A, MD  Skin Protectants, Misc. (MINERIN CREME EX) Apply 1 application topically daily. Apply to both arms and legs   Yes [provider]  tamsulosin (FLOMAX) 0.4 MG CAPS capsule Take 1 capsule (0.4 mg total) by mouth daily. Patient taking differently: Take 0.8 mg by mouth daily. 10/23/19  Yes Regalado, Belkys A, MD  tiZANidine (ZANAFLEX) 2 MG tablet Take 2 mg by mouth at bedtime. 03/27/21  Yes [provider]  traZODone (DESYREL) 50 MG tablet Take 50 mg by mouth at bedtime.   Yes [provider]  zinc oxide (BALMEX) 11.3 % CREA cream Apply 1 application topically See admin instructions. Apply to bottom and groin area 3 times daily and as needed for incontinence care   Yes [provider]  acetaminophen (TYLENOL) 325 MG tablet Take 650 mg by mouth every 6 (six) hours as needed for moderate pain.    [provider]  albuterol (VENTOLIN HFA) 108 (90 Base) MCG/ACT inhaler Inhale 2 puffs into the lungs every 6 (six) hours as needed for shortness of breath. 12/20/20   [provider]  allopurinol (ZYLOPRIM) 100 MG tablet Take 1 tablet (100 mg total) by mouth daily. 06/03/19   McLean-Scocuzza, Nino Glow, MD  ALPRAZolam Duanne Moron) 0.5 MG tablet Take 0.25 mg by mouth Every Tuesday,Thursday,and Saturday with dialysis. 09/21/21   [provider]  aspirin 81 MG chewable tablet Chew 81 mg by mouth daily.    [provider]  atorvastatin (LIPITOR) 20 MG tablet Take 20 mg by mouth daily.    [provider]  benzonatate (TESSALON) 200 MG capsule Take 200 mg by mouth 3  (three) times daily as needed for cough. Patient not taking: Reported on 06/23/2022    [provider]  bisacodyl (DULCOLAX) 10 MG suppository Place 10 mg rectally as needed for moderate constipation. Patient not taking: Reported on 07/03/2022    [provider]  Camphor-Menthol (FREEZE IT EX) Apply 1 application topically every 6 (six) hours as needed (back and neck pain).    [provider]  docusate sodium (COLACE) 100 MG capsule Take 200 mg by mouth daily as needed for moderate constipation. Patient not taking: Reported on 06/29/2022    [provider]  guaiFENesin (MUCINEX) 600 MG 12 hr tablet Take 600 mg by mouth every 12 (twelve) hours as needed (congestion).    [provider]  levothyroxine (SYNTHROID) 150 MCG tablet Take 150 mcg by mouth daily. 05/21/22   [provider]  ondansetron (ZOFRAN) 4 MG tablet Take 1 tablet (4 mg total) by mouth every 8 (eight) hours as needed. 11/06/20   Nance Pear, MD  senna (SENOKOT) 8.6 MG TABS tablet Take 2 tablets by mouth at bedtime as needed for moderate constipation.    [provider]  umeclidinium-vilanterol (ANORO ELLIPTA) 62.5-25 MCG/ACT AEPB Inhale 1 puff into the lungs daily.    [provider]     Critical care provider statement:   Total critical care time: 33 minutes   Performed by: Lanney Gins MD   Critical care time was exclusive of separately billable procedures and treating other patients.   Critical care was necessary to treat or prevent imminent or life-threatening deterioration.   Critical care was time spent personally by me on the following activities: development of treatment plan  with patient and/or surrogate as well as nursing, discussions with consultants, evaluation of patient's response to treatment, examination of patient, obtaining history from patient or surrogate, ordering and performing treatments and interventions, ordering and review of  laboratory studies, ordering and review of radiographic studies, pulse oximetry and re-evaluation of patient's condition.    Ottie Glazier, M.D.  Pulmonary & Critical Care Medicine

## 2022-06-24 NOTE — Progress Notes (Signed)
Subjective:  CC: Frank [name] is a 81 y.o. male  Hospital stay day 2,   IR guided percutaneous cholecystostomy tube placement for acute cholecystitis, complicated by significant comorbid conditions and need for anticoagulation.  HPI: Possible episode or aspiration reported overnight.  Still persistent hypotension requiring pressor use.  No change in pain  ROS:  General: Denies weight loss, weight gain, fatigue, fevers, chills, and night sweats. Heart: Denies chest pain, palpitations, racing heart, irregular heartbeat, leg pain or swelling, and decreased activity tolerance. Respiratory: Denies breathing difficulty, shortness of breath, wheezing, cough, and sputum. GI: Denies change in appetite, heartburn, nausea, vomiting, constipation, diarrhea, and blood in stool. GU: Denies difficulty urinating, pain with urinating, urgency, frequency, blood in urine.   Objective:   Temp:  [98.2 F (36.8 C)-100.1 F (37.8 C)] 98.7 F (37.1 C) (10/15 0400) Pulse Rate:  [39-134] 79 (10/15 0830) Resp:  [19-36] 22 (10/15 0830) BP: (86-130)/(41-67) 102/46 (10/15 0830) SpO2:  [88 %-100 %] 97 % (10/15 0830) Weight:  [87.8 kg-88.8 kg] 87.8 kg (10/14 2129)     Height: 5\' 8"  (172.7 cm) Weight: 87.8 kg BMI (Calculated): 29.44   Intake/Output this shift:   Intake/Output Summary (Last 24 hours) at 06/24/2022 0917 Last data filed at 06/24/2022 0600 Gross per 24 hour  Intake 1104.83 ml  Output 1180 ml  Net -75.17 ml    Constitutional :  alert, cooperative, appears stated age, and no distress  Respiratory:  clear to auscultation bilaterally  Cardiovascular:  regular rate and rhythm  Gastrointestinal: Soft, no guarding, right upper quadrant pain at insertion site of cholecystostomy tube which is draining dark bile. .   Skin: Cool and moist.   Psychiatric: Normal affect, non-agitated, not confused       LABS:     Latest Ref Rng & Units 06/24/2022    5:38 AM 06/23/2022    5:07 AM 06/11/2022     8:12 PM  CMP  Glucose 70 - 99 mg/dL 102  159  121   BUN 8 - 23 mg/dL 28  37  33   Creatinine 0.61 - 1.24 mg/dL 4.23  5.18  5.11   Sodium 135 - 145 mmol/L 134  137  136   Potassium 3.5 - 5.1 mmol/L 4.0  3.9  4.0   Chloride 98 - 111 mmol/L 96  97  97   CO2 22 - 32 mmol/L 29  27  28    Calcium 8.9 - 10.3 mg/dL 8.5  7.8  8.2       Latest Ref Rng & Units 06/24/2022    5:38 AM 06/23/2022    5:07 AM 07/02/2022    1:10 AM  CBC  WBC 4.0 - 10.5 K/uL 11.0  12.4  3.1   Hemoglobin 13.0 - 17.0 g/dL 10.3  10.3  13.0   Hematocrit 39.0 - 52.0 % 33.3  33.9  43.5   Platelets 150 - 400 K/uL 135  123  153     RADS: CLINICAL DATA:  Cholelithiasis, gallbladder wall thickening and edema suggesting acute cholecystitis, sepsis. General surgery requested consultation for cholecystostomy catheter placement.   EXAM: PERCUTANEOUS CHOLECYSTOSTOMY TUBE PLACEMENT WITH ULTRASOUND AND FLUOROSCOPIC GUIDANCE   FLUOROSCOPY: Radiation Exposure Index (as provided by the fluoroscopic device): 6.6 mGy air Kerma   TECHNIQUE: The procedure, risks (including but not limited to bleeding, infection, organ damage ), benefits, and alternatives were explained to the patient. Questions regarding the procedure were encouraged and answered. The patient understands and consents to the procedure.  Survey ultrasound of the abdomen was performed and an appropriate skin entry site was identified. Skin site was marked, prepped with Betadine, and draped in usual sterile fashion, and infiltrated locally with 1% lidocaine.   Intravenous Fentanyl 17mcg and Versed 0.5mg  were administered as conscious sedation during continuous monitoring of the patient's level of consciousness and physiological / cardiorespiratory status by the radiology RN, with a total moderate sedation time of 10 minutes.   Under real-time ultrasound guidance, gallbladder was accessed using a transhepatic approach with a 21-gauge needle. Ultrasound  image documentation was saved. Bile returned through the hub. Needle was exchanged over a 018 guidewire for transitional dilator which allowed placement of 035 wire. Over this, a 10.2 French pigtail catheter was advanced and formed centrally in the gallbladder lumen. Small contrast injection confirmed appropriate position. Catheter secured externally with 0 Prolene suture and placed external drain bag. Patient tolerated the procedure well.   COMPLICATIONS: COMPLICATIONS none   IMPRESSION: 1. Technically successful percutaneous cholecystostomy tube placement with ultrasound and fluoroscopic guidance.     Electronically Signed   By: Lucrezia Europe M.D.   On: 06/25/2022 15:07 Assessment:   IR guided percutaneous cholecystostomy tube placement for acute cholecystitis, complicated by significant comorbid conditions and need for anticoagulation.  Doing well from gallbladder standpoint.  Recommend continuing to monitor for any recurrent symptoms.  Further care per ICU team regarding his persistent hypotension.    Still okay to advance diet as tolerated from surgery standpoint, once speech eval is completed.  Will peripherally follow for now.  Please call with any additional questions.  Follow-up regarding drain care and outpatient management per radiology team.  labs/images/medications/previous chart entries reviewed personally and relevant changes/updates noted above.

## 2022-06-25 DIAGNOSIS — K819 Cholecystitis, unspecified: Secondary | ICD-10-CM | POA: Diagnosis not present

## 2022-06-25 DIAGNOSIS — N186 End stage renal disease: Secondary | ICD-10-CM | POA: Diagnosis not present

## 2022-06-25 DIAGNOSIS — E1122 Type 2 diabetes mellitus with diabetic chronic kidney disease: Secondary | ICD-10-CM | POA: Diagnosis not present

## 2022-06-25 DIAGNOSIS — I482 Chronic atrial fibrillation, unspecified: Secondary | ICD-10-CM | POA: Diagnosis not present

## 2022-06-25 DIAGNOSIS — K810 Acute cholecystitis: Secondary | ICD-10-CM | POA: Diagnosis not present

## 2022-06-25 LAB — CBC
HCT: 34.2 % — ABNORMAL LOW (ref 39.0–52.0)
Hemoglobin: 10.5 g/dL — ABNORMAL LOW (ref 13.0–17.0)
MCH: 30.8 pg (ref 26.0–34.0)
MCHC: 30.7 g/dL (ref 30.0–36.0)
MCV: 100.3 fL — ABNORMAL HIGH (ref 80.0–100.0)
Platelets: 156 10*3/uL (ref 150–400)
RBC: 3.41 MIL/uL — ABNORMAL LOW (ref 4.22–5.81)
RDW: 13.4 % (ref 11.5–15.5)
WBC: 12.4 10*3/uL — ABNORMAL HIGH (ref 4.0–10.5)
nRBC: 0 % (ref 0.0–0.2)

## 2022-06-25 LAB — BASIC METABOLIC PANEL
Anion gap: 11 (ref 5–15)
BUN: 39 mg/dL — ABNORMAL HIGH (ref 8–23)
CO2: 29 mmol/L (ref 22–32)
Calcium: 9.2 mg/dL (ref 8.9–10.3)
Chloride: 95 mmol/L — ABNORMAL LOW (ref 98–111)
Creatinine, Ser: 5.6 mg/dL — ABNORMAL HIGH (ref 0.61–1.24)
GFR, Estimated: 10 mL/min — ABNORMAL LOW (ref >60)
Glucose, Bld: 139 mg/dL — ABNORMAL HIGH (ref 70–99)
Potassium: 4.3 mmol/L (ref 3.5–5.1)
Sodium: 135 mmol/L (ref 135–145)

## 2022-06-25 LAB — GLUCOSE, CAPILLARY
Glucose-Capillary: 114 mg/dL — ABNORMAL HIGH (ref 70–99)
Glucose-Capillary: 121 mg/dL — ABNORMAL HIGH (ref 70–99)
Glucose-Capillary: 125 mg/dL — ABNORMAL HIGH (ref 70–99)
Glucose-Capillary: 126 mg/dL — ABNORMAL HIGH (ref 70–99)
Glucose-Capillary: 127 mg/dL — ABNORMAL HIGH (ref 70–99)
Glucose-Capillary: 129 mg/dL — ABNORMAL HIGH (ref 70–99)
Glucose-Capillary: 142 mg/dL — ABNORMAL HIGH (ref 70–99)

## 2022-06-25 LAB — PHOSPHORUS: Phosphorus: 3.5 mg/dL (ref 2.5–4.6)

## 2022-06-25 LAB — MAGNESIUM: Magnesium: 2.1 mg/dL (ref 1.7–2.4)

## 2022-06-25 MED ORDER — APIXABAN 2.5 MG PO TABS
2.5000 mg | ORAL_TABLET | Freq: Two times a day (BID) | ORAL | Status: DC
  Administered 2022-06-25 – 2022-06-27 (×5): 2.5 mg via ORAL
  Filled 2022-06-25 (×5): qty 1

## 2022-06-25 MED ORDER — SODIUM CHLORIDE 0.9 % IV SOLN
3.0000 g | Freq: Two times a day (BID) | INTRAVENOUS | Status: DC
  Administered 2022-06-26 – 2022-06-27 (×4): 3 g via INTRAVENOUS
  Filled 2022-06-25 (×3): qty 8
  Filled 2022-06-25: qty 3
  Filled 2022-06-25 (×2): qty 8

## 2022-06-25 MED ORDER — LACTATED RINGERS IV BOLUS: 1000.0000 mL | Freq: Once | INTRAVENOUS | Status: DC

## 2022-06-25 MED ORDER — VANCOMYCIN HCL 2000 MG/400ML IV SOLN
2000.0000 mg | Freq: Once | INTRAVENOUS | Status: AC
  Administered 2022-06-25: 2000 mg via INTRAVENOUS
  Filled 2022-06-25: qty 400

## 2022-06-25 MED ORDER — VANCOMYCIN HCL IN DEXTROSE 1-5 GM/200ML-% IV SOLN
1000.0000 mg | INTRAVENOUS | Status: DC
  Administered 2022-06-26: 1000 mg via INTRAVENOUS
  Filled 2022-06-25 (×2): qty 200

## 2022-06-25 NOTE — Progress Notes (Signed)
NAME:  Frank [name], MRN:  [id number], DOB:  Sep 28, 1940, LOS: 3 ADMISSION DATE:  07/07/2022, CONSULTATION DATE: 06/29/2022 REFERRING MD: Dr. Francine Graven, CHIEF COMPLAINT: Abdominal Pain  History of Present IllnessSYNOPSIS  81 yo male who presented to [facility] ER on 10/13 with a 24 hr hx of RUQ abdominal pain, nausea, and vomiting.  He also endorsed subjective fevers and chills. Remains on pressors in ICU  ED Course  Upon arrival to the ER lab results revealed K+ 3.2/chloride 94/ glucose 163/creatinine 4.05/calcium 8.7/anion gap 19/troponin 49/lactic acid 2.5/pct 6.51/wbc 3.1/PT 17.3/INR 1.4.  CT Abd/Pelvis concerning for cholelithiasis with gallbladder wall thickening and edema suggesting acute cholecystitis.  Pt received zosyn.  General surgery consulted by ER provider and IR consulted per recommendations for cholecystostomy tube placement.  PT admitted to the stepdown unit per hospitalist team for additional workup and treatment.    Initial ER vital signs: temp 100 F, bp 95/49/hr 88/resp rate 18/O2 sats 97% on RA Initial EKG: Atrial fibrillation with PVC's and RBBB, hr 91 CT Abd Pelvis 10/13: Cholelithiasis with gallbladder wall thickening and edema suggesting acute cholecystitis. Small amount of free fluid in the pelvis. Increased density in the fluid may indicate hemorrhagic fluid or proteinaceous fluid. Source is not identified. Prostate gland enlargement. Bladder wall thickening likely due to outlet obstruction but could indicate cystitis. Small right pleural effusion with basilar atelectasis or consolidation.    Significant Hospital Events: Including procedures, antibiotic start and stop dates in addition to other pertinent events   10/13: Admitted with sepsis secondary to acute cholecystitis IR consulted for cholecystostomy tube placement.  PCCM team consulted due to soft bp's may require vasopressors   CXR 10/13: Bibasilar atelectasis   Despite 2L fluid bolus bp's remained soft PCCM team  consulted for possible need of vasopressors.    06/23/22- patient seen and examined at bedside.  He remains critically ill on levophed with chronic aphasic speech post previous stroke.  He still has active return form GB drain but seems clinically improved. He reports ongoing RUQ pain and tenderness.   He is for HD today.   06/24/22- patient is mildly improved, he is more awake and interactive.  He is asking if he can eat and surgery has cleared him to start PO.  He is for swallow evaluation today, hx of CVA.  Ongoing active bilious return from cholecystostomy drain. Still has RUQ pain/tenderness but improved.  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    10/16 remains on pressors   Pertinent  Medical History  Anemia of CKD Arthritis BPH Bradycardia  Bilateral Cataracts  Type II Diabetes Mellitus Diabetic Nephropathy Erectile Dysfunction Essential HTN Elevated Liver Enzymes  UTI HLD Hypothyroidism Insominia Illiterate Peripheral Neuropathy  PVD Second Degree Heart Block Stroke   Interim History / Subjective:  Remains on pressors Remains critically ill   Objective   Blood pressure 117/62, pulse 79, temperature 98.8 F (37.1 C), temperature source Oral, resp. rate 20, height 5\' 8"  (1.727 m), weight 87.8 kg, SpO2 99 %.        Intake/Output Summary (Last 24 hours) at 06/25/2022 0735 Last data filed at 06/24/2022 1937 Gross per 24 hour  Intake 1297.36 ml  Output 170 ml  Net 1127.36 ml    Filed Weights   06/26/2022 1252 06/23/22 1821 06/23/22 2129  Weight: 85.9 kg 88.8 kg 87.8 kg      Review of Systems: Gen:  Acute on chronically-ill appearing male, NAD on 3L O2  Cardiac:  No  dizziness, chest pain or heaviness, chest tightness,edema, No JVD Resp:   No cough, -sputum production, -shortness of breath,-wheezing, -hemoptysis,  Other:  All other systems negative  Physical Examination:   General Appearance: No distress  EYES PERRLA, EOM intact.   NECK Supple, No  JVD Pulmonary: normal breath sounds, No wheezing.  CardiovascularNormal S1,S2.  No m/r/g.   Abdomen: Benign, Soft, non-tender. NEURO MENTAL STATUS IMPROVING ALL OTHER ROS ARE NEGATIVE     Assessment & Plan:   81 yo white male admitted for severe sepsis and septic shock POA due to acute infected GB  Sepsis secondary to cholelithiasis remains on pressors Continue pressors MAP goal 55 - IR consulted for cholecystostomy tube placement - Continue midodrine    Elevated Troponin secondary to demand ischemia  - Trend troponins  - continuous cardiac monitoring Hx: Essential HTN, HLD, PVD, and Stroke  - Trend until troponin peaked   ESRD ON HD -continue Foley Catheter-assess need -Avoid nephrotoxic agents -Follow urine output, BMP -Ensure adequate renal perfusion, optimize oxygenation -Renal dose medications HD as needed   Intake/Output Summary (Last 24 hours) at 06/25/2022 0739 Last data filed at 06/24/2022 1937 Gross per 24 hour  Intake 1297.36 ml  Output 170 ml  Net 1127.36 ml    ENDO - ICU hypoglycemic\Hyperglycemia protocol -check FSBS per protocol   GI GI PROPHYLAXIS as indicated  NUTRITIONAL STATUS DIET-->TF's as tolerated Constipation protocol as indicated   ELECTROLYTES -follow labs as needed -replace as needed -pharmacy consultation and following  Best Practice (right click and "Reselect all SmartList Selections" daily)   Diet/type: NPO DVT prophylaxis: SCD GI prophylaxis: N/A Lines: N/A Foley:  N/A   Labs   CBC: Recent Labs  Lab 06/14/2022 0110 06/23/22 0507 06/24/22 0538 06/25/22 0411  WBC 3.1* 12.4* 11.0* 12.4*  HGB 13.0 10.3* 10.3* 10.5*  HCT 43.5 33.9* 33.3* 34.2*  MCV 100.9* 99.7 101.5* 100.3*  PLT 153 123* 135* 156     Basic Metabolic Panel: Recent Labs  Lab 07/09/2022 0110 06/25/2022 2012 06/23/22 0507 06/24/22 0538 06/25/22 0411  NA 138 136 137 134* 135  K 3.2* 4.0 3.9 4.0 4.3  CL 94* 97* 97* 96* 95*  CO2 25 28 27 29  29   GLUCOSE 163* 121* 159* 102* 139*  BUN 22 33* 37* 28* 39*  CREATININE 4.05* 5.11* 5.18* 4.23* 5.60*  CALCIUM 8.7* 8.2* 7.8* 8.5* 9.2  MG  --  1.8 1.8 2.0 2.1  PHOS  --   --  3.9 3.0 3.5    GFR: Estimated Creatinine Clearance: 11.3 mL/min (A) (by C-G formula based on SCr of 5.6 mg/dL (H)). Recent Labs  Lab 07/08/2022 0110 06/26/2022 0348 06/29/2022 0914 06/23/22 0507 06/24/22 0538 06/25/22 0411  PROCALCITON  --  6.51  --  35.56 33.92  --   WBC 3.1*  --   --  12.4* 11.0* 12.4*  LATICACIDVEN  --  2.5* 1.3  --   --   --      Liver Function Tests: Recent Labs  Lab 07/10/2022 0110  AST 23  ALT 9  ALKPHOS 78  BILITOT 1.2  PROT 7.6  ALBUMIN 4.0    Recent Labs  Lab 06/21/2022 0110  LIPASE 38    No results for input(s): "AMMONIA" in the last 168 hours.  ABG    Component Value Date/Time   TCO2 31 10/27/2021 0811     Coagulation Profile: Recent Labs  Lab 07/10/2022 0348 06/23/22 0507  INR 1.4* 1.9*  Cardiac Enzymes: No results for input(s): "CKTOTAL", "CKMB", "CKMBINDEX", "TROPONINI" in the last 168 hours.  HbA1C: Hgb A1c MFr Bld  Date/Time Value Ref Range Status  07/02/2022 01:10 AM 5.2 4.8 - 5.6 % Final    Comment:    (NOTE) Pre diabetes:          5.7%-6.4%  Diabetes:              >6.4%  Glycemic control for   <7.0% adults with diabetes   10/08/2019 03:16 AM 6.7 (H) 4.8 - 5.6 % Final    Comment:    (NOTE) Pre diabetes:          5.7%-6.4% Diabetes:              >6.4% Glycemic control for   <7.0% adults with diabetes     CBG: Recent Labs  Lab 06/24/22 1139 06/24/22 1547 06/24/22 1948 06/25/22 0030 06/25/22 0409  GLUCAP 115* 132* 145* 142* 127*      Past Medical History:  He,  has a past medical history of Anemia of chronic kidney failure, Arthritis, Benign prostate hyperplasia, Bradycardia, Cataracts, bilateral (05/15/2012), 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   Surgical History:   Past Surgical History:  Procedure Laterality Date   AV FISTULA PLACEMENT Left 11/19/2017   Procedure: ARTERIOVENOUS (AV) FISTULA CREATION;  Surgeon: [name], MD;  Location: [place];  Service: Vascular;  Laterality: Left;   AV FISTULA PLACEMENT Right 10/27/2021   Procedure: CREATION OF BRACHIOCEPHALIC RIGHT ARM ARTERIOVENOUS (AV) FISTULA;  Surgeon: [name], MD;  Location: [place];  Service: Vascular;  Laterality: Right;   [place] Left 06/04/2018   Procedure: BASILIC VEIN TRANSPOSITION ARM;  Surgeon: [name], MD;  Location: [place];  Service: Vascular;  Laterality: Left;   COLONOSCOPY     DIALYSIS/PERMA CATHETER REMOVAL N/A 03/23/2019   Procedure: DIALYSIS/PERMA CATHETER REMOVAL;  Surgeon: [name], MD;  Location: [place] [facility];  Service: Cardiovascular;  Laterality: N/A;   EYE SURGERY     cataract surgery b/l; photophobia since    INSERTION OF DIALYSIS CATHETER N/A 01/23/2019   Procedure: INSERTION OF DIALYSIS CATHETER;  Surgeon: [name], MD;  Location: [facility];  Service: Vascular;  Laterality: N/A;  INSERTION OF DIALYSIS CATHETER   INSERTION OF DIALYSIS CATHETER  2022   right subclavian (chest area)   IR ANGIO INTRA EXTRACRAN SEL COM CAROTID INNOMINATE UNI L MOD SED  10/15/2019   IR ANGIO VERTEBRAL SEL SUBCLAVIAN INNOMINATE UNI R MOD SED  10/15/2019   IR CT HEAD LTD  10/15/2019   IR FLUORO GUIDE CV  LINE RIGHT  10/08/2019   IR INTRAVSC STENT CERV CAROTID W/EMB-PROT MOD SED INCL ANGIO  10/15/2019   IR PERC CHOLECYSTOSTOMY  07/03/2022   IR THROMBECTOMY AV FISTULA W/THROMBOLYSIS/PTA INC/SHUNT/IMG LEFT Left 10/09/2019   IR US GUIDE VASC ACCESS LEFT  10/09/2019   IR US GUIDE VASC ACCESS RIGHT  10/08/2019   KNEE ARTHROSCOPY Left    Knee   PERIPHERAL VASCULAR THROMBECTOMY Left 07/06/2019   Procedure: PERIPHERAL VASCULAR THROMBECTOMY;  Surgeon: [name], MD;  Location:  [place] [facility];  Service: Cardiovascular;  Laterality: Left;   RADIOLOGY WITH ANESTHESIA N/A 10/15/2019   Procedure: stent placement;  Surgeon: Luanne Bras, MD;  Location: [place];  Service: Radiology;  Laterality: N/A;   THROMBECTOMY W/ EMBOLECTOMY Left 01/27/2019   Procedure: THROMBECTOMY Left arm ARTERIOVENOUS FISTULA  and Left arm Arteriovenous gortex graft;  Surgeon: [name], MD;  Location: [facility];  Service: Vascular;  Laterality: Left;   VENOGRAM Left 01/27/2019   Procedure: Left arm FISTULOGRAM/;  Surgeon: [name], MD;  Location: [place];  Service: Vascular;  Laterality: Left;    Home Medications  Prior to Admission medications   Medication Sig Start Date End Date Taking? Authorizing Provider  ALPRAZolam Duanne Moron) 0.25 MG tablet Take 0.25 mg by mouth daily with supper. 01/11/20  Yes [provider]  apixaban (ELIQUIS) 2.5 MG TABS tablet TAKE ONE TABLET BY MOUTH TWICE A DAY. **THIS MEDICINE THINS THE BLOOD** 05/02/22  Yes Shirley Friar, PA-C  Cholecalciferol (VITAMIN D) 50 MCG (2000 UT) tablet Take 2,000 Units by mouth daily. 01/24/21  Yes [provider]  donepezil (ARICEPT) 5 MG tablet Take 1 tablet (5 mg total) by mouth at bedtime. 12/23/19  Yes Melvenia Beam, MD  lidocaine-prilocaine (EMLA) cream Apply 1 application topically See admin instructions. Apply to access site 1 to 2 hours before dialysis. Cover with occlusive dressing.On Tuesday,Thursday and Saturday  04/08/19  Yes [provider]  Melatonin 10 MG TABS Take 10 mg by mouth at bedtime.   Yes [provider]  midodrine (PROAMATINE) 10 MG tablet Take 1 tablet (10 mg total) by mouth 3 (three) times daily with meals. Patient taking differently: Take 10 mg by mouth 3 (three) times daily with meals. Use as directed 12/14/19  Yes Tillery, Satira Mccallum, PA-C  midodrine (PROAMATINE) 5 MG tablet 5 mg. Take 5 mg by mouth at 10:30 and 1 pm on Dialysis days   Yes [provider]  neomycin-bacitracin-polymyxin (NEOSPORIN) [phone] ointment Apply 1 Application topically 2 (two) times a week.   Yes [provider]  pantoprazole (PROTONIX) 40 MG tablet Take 1 tablet (40 mg total) by mouth daily. Patient taking differently: Take 40 mg by mouth daily before breakfast. 10/23/19 07/06/2022 Yes Regalado, Belkys A, MD  sevelamer carbonate (RENVELA) 800 MG tablet Take 3 tablets (2,400 mg total) by mouth 3 (three) times daily with meals. Patient taking differently: Take 2,400 mg by mouth 3 (three) times daily with meals. Use as directed 10/23/19  Yes Regalado, Belkys A, MD  Skin Protectants, Misc. (MINERIN CREME EX) Apply 1 application topically daily. Apply to both arms and legs   Yes [provider]  tamsulosin (FLOMAX) 0.4 MG CAPS capsule Take 1 capsule (0.4 mg total) by mouth daily. Patient taking differently: Take 0.8 mg by mouth daily. 10/23/19  Yes Regalado, Belkys A, MD  tiZANidine (ZANAFLEX) 2 MG tablet Take 2 mg by mouth at bedtime. 03/27/21  Yes [provider]  traZODone (DESYREL) 50 MG tablet Take 50 mg by mouth at bedtime.   Yes [provider]  zinc oxide (BALMEX) 11.3 % CREA cream Apply 1 application topically See admin instructions. Apply to bottom and groin area 3 times daily and as needed for incontinence care   Yes [provider]  acetaminophen (TYLENOL) 325 MG tablet Take 650 mg by mouth every 6 (six) hours as needed for moderate pain.     [provider]  albuterol (VENTOLIN HFA) 108 (90 Base)  MCG/ACT inhaler Inhale 2 puffs into the lungs every 6 (six) hours as needed for shortness of breath. 12/20/20   [provider]  allopurinol (ZYLOPRIM) 100 MG tablet Take 1 tablet (100 mg total) by mouth daily. 06/03/19   McLean-Scocuzza, Nino Glow, MD  ALPRAZolam Duanne Moron) 0.5 MG tablet Take 0.25 mg by mouth Every Tuesday,Thursday,and Saturday with dialysis. 09/21/21   [provider]  aspirin 81 MG chewable tablet Chew 81 mg by mouth daily.    [provider]  atorvastatin (LIPITOR) 20 MG tablet Take 20 mg by mouth daily.    [provider]  benzonatate (TESSALON) 200 MG capsule Take 200 mg by mouth 3 (three) times daily as needed for cough. Patient not taking: Reported on 06/18/2022    [provider]  bisacodyl (DULCOLAX) 10 MG suppository Place 10 mg rectally as needed for moderate constipation. Patient not taking: Reported on 06/11/2022    [provider]  Camphor-Menthol (FREEZE IT EX) Apply 1 application topically every 6 (six) hours as needed (back and neck pain).    [provider]  docusate sodium (COLACE) 100 MG capsule Take 200 mg by mouth daily as needed for moderate constipation. Patient not taking: Reported on 07/02/2022    [provider]  guaiFENesin (MUCINEX) 600 MG 12 hr tablet Take 600 mg by mouth every 12 (twelve) hours as needed (congestion).    [provider]  levothyroxine (SYNTHROID) 150 MCG tablet Take 150 mcg by mouth daily. 05/21/22   [provider]  ondansetron (ZOFRAN) 4 MG tablet Take 1 tablet (4 mg total) by mouth every 8 (eight) hours as needed. 11/06/20   Nance Pear, MD  senna (SENOKOT) 8.6 MG TABS tablet Take 2 tablets by mouth at bedtime as needed for moderate constipation.    [provider]  umeclidinium-vilanterol (ANORO ELLIPTA) 62.5-25 MCG/ACT AEPB Inhale 1 puff into the lungs daily.    [provider]       DVT/GI PRX  assessed I Assessed the need for Labs I Assessed the need for Foley I Assessed the need for Central Venous Line Family Discussion when available I Assessed the need for Mobilization I made an Assessment of medications to be adjusted accordingly Safety Risk assessment completed  CASE DISCUSSED IN MULTIDISCIPLINARY ROUNDS WITH ICU TEAM     Critical Care Time devoted to patient care services described in this note is 45 minutes.  Critical care was necessary to treat /prevent imminent and life-threatening deterioration.   Corrin Parker, M.D.  [facility]  Medical Director [place] Director [facility] Cardio-Pulmonary Department

## 2022-06-25 NOTE — Progress Notes (Signed)
Frank [name]  MRN: [id number]  DOB/AGE: 1941/06/15 81 y.o.  Primary Care Physician:Hagan, Edmonia Lynch, NP  Admit date: 07/05/2022  Chief Complaint:  Chief Complaint  Patient presents with   Abdominal Pain    S-Pt presented on  07/10/2022 with  Chief Complaint  Patient presents with   Abdominal Pain  .   Patient is a 81 year old Caucasian male with a past medical history of diabetes mellitus, end-stage renal disease on hemodialysis on Tuesday Thursday Saturday schedule, hypothyroidism, depression, CVA, atrial fibrillation-on chronic anticoagulation therapy, anemia of chronic disease who was brought to the ER from [facility] assisted living with chief complaint of abdominal pain.   Patient was seen today in ICU. Patient offers no new specific physical complaint. Patient informed me that his abdominal pain is now better   Medications   allopurinol  100 mg Oral Daily   ALPRAZolam  0.25 mg Oral Q supper   ALPRAZolam  0.25 mg Oral Q T,Th,Sa-HD   atorvastatin  20 mg Oral Daily   Chlorhexidine Gluconate Cloth  6 each Topical Q0600   Chlorhexidine Gluconate Cloth  6 each Topical Q0600   cholecalciferol  2,000 Units Oral Daily   donepezil  5 mg Oral QHS   insulin aspart  0-6 Units Subcutaneous Q4H   levothyroxine  150 mcg Oral Daily   lidocaine-prilocaine  1 Application Topical See admin instructions   melatonin  10 mg Oral QHS   midodrine  10 mg Oral TID WC   midodrine  5 mg Oral 2 times per day on Tue Thu Sat   neomycin-bacitracin-polymyxin  1 Application Topical Once per day on Mon Thu   sevelamer carbonate  2,400 mg Oral TID WC   sodium chloride flush  5 mL Intracatheter Q8H   tiZANidine  2 mg Oral QHS   traZODone  50 mg Oral QHS   umeclidinium-vilanterol  1 puff Inhalation Daily         YYT:KPTWS from the symptoms mentioned above,there are no other symptoms referable to all systems reviewed.  Physical Exam: Vital signs in last 24 hours: Temp:  [97.9 F (36.6  C)-99.4 F (37.4 C)] 98.8 F (37.1 C) (10/16 0400) Pulse Rate:  [51-98] 79 (10/16 0400) Resp:  [16-34] 20 (10/16 0400) BP: (77-121)/(38-66) 117/62 (10/16 0400) SpO2:  [86 %-100 %] 99 % (10/16 0400) Weight change:  Last BM Date :  (PTA)  Intake/Output from previous day: 10/15 0701 - 10/16 0700 In: 1297.4 [P.O.:660; I.V.:587.3; IV Piggyback:50] Out: 170 [Drains:170] No intake/output data recorded.   Physical Exam:  General- pt is awake,alert, oriented to time place and person, Chronically ill-appearing  Resp- No acute REsp distress, Decreased breath sounds at bases  CVS- S1S2 regular in rate and rhythm  GIT- BS+, soft, NT,JP drain in situ  EXT- NO LE Edema, Cyanosis   Access-  AVF positive bruit and thrill  Lab Results:  CBC  Recent Labs    06/24/22 0538 06/25/22 0411  WBC 11.0* 12.4*  HGB 10.3* 10.5*  HCT 33.3* 34.2*  PLT 135* 156    BMET  Recent Labs    06/24/22 0538 06/25/22 0411  NA 134* 135  K 4.0 4.3  CL 96* 95*  CO2 29 29  GLUCOSE 102* 139*  BUN 28* 39*  CREATININE 4.23* 5.60*  CALCIUM 8.5* 9.2      Most recent Creatinine trend  Lab Results  Component Value Date   CREATININE 5.60 (H) 06/25/2022   CREATININE 4.23 (H) 06/24/2022   CREATININE  5.18 (H) 06/23/2022      MICRO   Recent Results (from the past 240 hour(s))  Blood culture (routine x 2)     Status: None (Preliminary result)   Collection Time: 07/06/2022  3:48 AM   Specimen: BLOOD  Result Value Ref Range Status   Specimen Description BLOOD LEFT FOREARM  Final   Special Requests   Final    BOTTLES DRAWN AEROBIC AND ANAEROBIC Blood Culture results may not be optimal due to an excessive volume of blood received in culture bottles   Culture   Final    NO GROWTH 2 DAYS Performed at [facility], [address], [place], [place] [address]    Report Status PENDING  Incomplete  Blood culture (routine x 2)     Status: None (Preliminary result)   Collection Time:  06/30/2022  3:48 AM   Specimen: BLOOD  Result Value Ref Range Status   Specimen Description BLOOD LEFT ASSIST CONTROL  Final   Special Requests   Final    BOTTLES DRAWN AEROBIC AND ANAEROBIC Blood Culture results may not be optimal due to an excessive volume of blood received in culture bottles   Culture   Final    NO GROWTH 2 DAYS Performed at [facility], [address], [place], [place] [address]    Report Status PENDING  Incomplete  SARS Coronavirus 2 by RT PCR (hospital order, performed in [place] hospital lab) *cepheid single result test* Anterior Nasal Swab     Status: None   Collection Time: 07/07/2022 12:07 PM   Specimen: Anterior Nasal Swab  Result Value Ref Range Status   SARS Coronavirus 2 by RT PCR NEGATIVE NEGATIVE Final    Comment: (NOTE) SARS-CoV-2 target nucleic acids are NOT DETECTED.  The SARS-CoV-2 RNA is generally detectable in upper and lower respiratory specimens during the acute phase of infection. The lowest concentration of SARS-CoV-2 viral copies this assay can detect is 250 copies / mL. A negative result does not preclude SARS-CoV-2 infection and should not be used as the sole basis for treatment or other patient management decisions.  A negative result may occur with improper specimen collection / handling, submission of specimen other than nasopharyngeal swab, presence of viral mutation(s) within the areas targeted by this assay, and inadequate number of viral copies (<250 copies / mL). A negative result must be combined with clinical observations, patient history, and epidemiological information.  Fact Sheet for Patients:   https://www.patel.info/  Fact Sheet for Healthcare Providers: https://hall.com/  This test is not yet approved or  cleared by the [place] FDA and has been authorized for detection and/or diagnosis of SARS-CoV-2 by FDA under an Emergency Use Authorization (EUA).  This  EUA will remain in effect (meaning this test can be used) for the duration of the COVID-19 declaration under Section 564(b)(1) of the Act, 21 U.S.C. section 360bbb-3(b)(1), unless the authorization is terminated or revoked sooner.  Performed at [facility], [address] [place], [place] [address]   MRSA Next Gen by PCR, Nasal     Status: None   Collection Time: 06/11/2022 12:58 PM   Specimen: Nasal Mucosa; Nasal Swab  Result Value Ref Range Status   MRSA by PCR Next Gen NOT DETECTED NOT DETECTED Final    Comment: (NOTE) The GeneXpert MRSA Assay (FDA approved for NASAL specimens only), is one component of a comprehensive MRSA colonization surveillance program. It is not intended to diagnose MRSA infection nor to guide or  monitor treatment for MRSA infections. Test performance is not FDA approved in patients less than 20 years old. Performed at [facility], [address] [place], [place] [address]   Aerobic/Anaerobic Culture w Gram Stain (surgical/deep wound)     Status: None (Preliminary result)   Collection Time: 06/16/2022  2:43 PM   Specimen: BILE  Result Value Ref Range Status   Specimen Description   Final    BILE Performed at [facility], [address], [place], [place] [address]    Special Requests   Final    NONE Performed at [facility], [place], [place] [address]    Gram Stain NO WBC SEEN FEW GRAM NEGATIVE RODS   Final   Culture   Final    FEW KLEBSIELLA OXYTOCA HOLDING FOR POSSIBLE ANAEROBE Performed at [facility], [place] [address], [place], [place] [address]    Report Status PENDING  Incomplete   Organism ID, Bacteria KLEBSIELLA OXYTOCA  Final      Susceptibility   Klebsiella oxytoca - MIC*    AMPICILLIN RESISTANT Resistant     CEFAZOLIN <=4 SENSITIVE Sensitive     CEFEPIME <=0.12 SENSITIVE Sensitive     CEFTAZIDIME <=1 SENSITIVE Sensitive     CEFTRIAXONE <=0.25 SENSITIVE Sensitive      CIPROFLOXACIN <=0.25 SENSITIVE Sensitive     GENTAMICIN <=1 SENSITIVE Sensitive     IMIPENEM <=0.25 SENSITIVE Sensitive     TRIMETH/SULFA <=20 SENSITIVE Sensitive     AMPICILLIN/SULBACTAM <=2 SENSITIVE Sensitive     PIP/TAZO <=4 SENSITIVE Sensitive     * FEW KLEBSIELLA OXYTOCA         Impression:   1)Renal     End-stage renal disease Patient is on hemodialysis Patient is on Tuesday Thursday Saturday schedule Patient undergoes dialysis at [facility] under the care of Dr. Graylon Gunning Patient was last dialyzed Saturday No need for renal placement therapy today     2)Hypotension Patient is on midodrine And on pressors    3)Anemia of chronic disease     Latest Ref Rng & Units 06/25/2022    4:11 AM 06/24/2022    5:38 AM 06/23/2022    5:07 AM  CBC  WBC 4.0 - 10.5 K/uL 12.4  11.0  12.4   Hemoglobin 13.0 - 17.0 g/dL 10.5  10.3  10.3   Hematocrit 39.0 - 52.0 % 34.2  33.3  33.9   Platelets 150 - 400 K/uL 156  135  123        HGb at goal (9--11) We will keep patient on Epogen during dialysis  4) Secondary hyperparathyroidism -CKD Mineral-Bone Disorder    Lab Results  Component Value Date   PTH 184 (H) 01/23/2019   CALCIUM 9.2 06/25/2022   CAION 1.15 10/27/2021   PHOS 3.5 06/25/2022    Secondary Hyperparathyroidism present   Phosphorus at goal.   5)Acute cholecystitis Patient is being followed by primary team, surgery team Patient is to have percutaneous cholecystostomy tube   6) Electrolytes      Latest Ref Rng & Units 06/25/2022    4:11 AM 06/24/2022    5:38 AM 06/23/2022    5:07 AM  BMP  Glucose 70 - 99 mg/dL 139  102  159   BUN 8 - 23 mg/dL 39  28  37   Creatinine 0.61 - 1.24 mg/dL 5.60  4.23  5.18   Sodium 135 - 145 mmol/L 135  134  137  Potassium 3.5 - 5.1 mmol/L 4.3  4.0  3.9   Chloride 98 - 111 mmol/L 95  96  97   CO2 22 - 32 mmol/L 29  29  27    Calcium 8.9 - 10.3 mg/dL 9.2  8.5  7.8       Sodium Hyponatremia Patient sodium is low secondary to ESRD/inability to get rid of free water   Potassium Normokalemic    7)Acid base  Co2 at goal     Plan:    No need for renal placement therapy today    Frank [name] 06/25/2022, 7:06 AM

## 2022-06-25 NOTE — Consult Note (Signed)
Pharmacy Antibiotic Note  Frank [name] is a 81 y.o. male admitted on 07/06/2022 with  intra-abdominal infection . Pharmacy has been consulted for vancomycin dosing.  Assessment: 81 yo M with PMH ESRD-HD (TTS), anemia of CKD, hypothyroidism, depression, CVA, Afib (on Eliquis) presenting with abdominal pain. CTAP shows cholelithiasis with gallbladder wall thickening and edema suggesting acute cholecystitis. Pt was febrile (Tmax 103), tachypneic, hypotensive, lactic acid 2.5, with low WBC of 3.1. Pt received one-time doses of Zosyn 3.375 g and metronidazole 500 mg in the ED. He was changed to Zosyn monotherapy then to ampicillin/sulbactam 10/17 based on klebsiella susceptibilities.  E. Raffinosus also on culture which is ampicillin resistant.    Plan:  Vancomycin 2gm IV x 1 then 1gm qHD on Tue/Thur/Sat Continue ampicillin/sulbactam 3gm IV q12h Monitor length of therapy and need to check pre-HD level Monitor for signs & symptoms of worsening infection to assess for antibiotic efficacy Follow up culture results to assess for opportunities to narrow therapy  Height: 5\' 8"  (172.7 cm) Weight: 87.8 kg (193 lb 9 oz) IBW/kg (Calculated) : 68.4  Temp (24hrs), Avg:99.1 F (37.3 C), Min:98.8 F (37.1 C), Max:99.4 F (37.4 C)  Recent Labs  Lab 07/09/2022 0110 07/04/2022 0348 07/04/2022 0914 06/19/2022 2012 06/23/22 0507 06/24/22 0538 06/25/22 0411  WBC 3.1*  --   --   --  12.4* 11.0* 12.4*  CREATININE 4.05*  --   --  5.11* 5.18* 4.23* 5.60*  LATICACIDVEN  --  2.5* 1.3  --   --   --   --      Estimated Creatinine Clearance: 11.3 mL/min (A) (by C-G formula based on SCr of 5.6 mg/dL (H)).    Allergies  Allergen Reactions   Ace Inhibitors Other (See Comments)    Hyperkalemia on ACE INHIBITORS Cr>1.9   Angiotensin Receptor Blockers Other (See Comments)    Hyperkalemia Elevates Cr > 1.9    Ivp Dye [Iodinated Contrast Media] Other (See Comments)    Cannot have due to renal failure   Adhesive  [Tape] Other (See Comments)    UNDESCRIBED REACTION Can tolerate ONLY Coban wrap or mild paper tape!!    Antimicrobials this admission: Zosyn 10/13 >> 10/16 Amp/sulb 10/17 >> Vanco 10/16 >> Metronidazole 10/13 x 1 (given because initial plan was for cefepime + Flagyl before plan was switched to Zosyn monotherapy)  Dose adjustments this admission: N/A  Microbiology results: 10/13 BCx: NGTD 10/13 COVID19 PCR: collected 10/13: Bile cx: K. Oxytoca (R to amp), E faecalis (R to amp), holding for anaerobe  Thank you for allowing pharmacy to be a part of this patient's care.  Doreene Eland, PharmD, BCPS, BCIDP Work Cell: [phone] 06/25/2022 2:43 PM

## 2022-06-25 NOTE — IPAL (Signed)
  Interdisciplinary Goals of Care Family Meeting   Date carried out: 06/25/2022  Location of the meeting: Phone conference  Member's involved: Physician and Family Member or next of kin      GOALS OF CARE DISCUSSION  The Clinical status was relayed to family in detail-Daughter [name] Pennington  Updated and notified of patients medical condition- H/o CVA, ESRD ON HD Losing weight Weaning off pressors +infected GB 2 species growing  Baseline BP is 80-90 SBP   Explained to family course of therapy and the modalities   Patient with Progressive multiorgan failure with a very high probablity of a very minimal chance of meaningful recovery despite all aggressive and optimal medical therapy.   Family understands the situation.  Daughter has  consented and agreed to DNR/DNI status  Family are satisfied with Plan of action and management. All questions answered  Additional CC time 25 mins   Jaxzen Vanhorn Patricia Pesa, M.D.  [facility]  Medical Director [place] Director [facility] Cardio-Pulmonary Department

## 2022-06-26 DIAGNOSIS — A419 Sepsis, unspecified organism: Secondary | ICD-10-CM | POA: Diagnosis not present

## 2022-06-26 DIAGNOSIS — I2510 Atherosclerotic heart disease of native coronary artery without angina pectoris: Secondary | ICD-10-CM | POA: Diagnosis not present

## 2022-06-26 DIAGNOSIS — K819 Cholecystitis, unspecified: Secondary | ICD-10-CM | POA: Diagnosis not present

## 2022-06-26 DIAGNOSIS — I482 Chronic atrial fibrillation, unspecified: Secondary | ICD-10-CM | POA: Diagnosis not present

## 2022-06-26 LAB — HEPATITIS B SURFACE ANTIBODY, QUANTITATIVE: Hep B S AB Quant (Post): 35.1 m[IU]/mL (ref >9.9)

## 2022-06-26 LAB — AEROBIC/ANAEROBIC CULTURE W GRAM STAIN (SURGICAL/DEEP WOUND): Gram Stain: NONE SEEN

## 2022-06-26 LAB — GLUCOSE, CAPILLARY
Glucose-Capillary: 101 mg/dL — ABNORMAL HIGH (ref 70–99)
Glucose-Capillary: 104 mg/dL — ABNORMAL HIGH (ref 70–99)
Glucose-Capillary: 124 mg/dL — ABNORMAL HIGH (ref 70–99)
Glucose-Capillary: 119 mg/dL — ABNORMAL HIGH (ref 70–99)

## 2022-06-26 LAB — MAGNESIUM: Magnesium: 2.2 mg/dL (ref 1.7–2.4)

## 2022-06-26 LAB — PHOSPHORUS: Phosphorus: 4.2 mg/dL (ref 2.5–4.6)

## 2022-06-26 MED ORDER — LIDOCAINE-PRILOCAINE 2.5-2.5 % EX CREA: 1.0000 | TOPICAL_CREAM | CUTANEOUS | Status: DC | PRN

## 2022-06-26 MED ORDER — ANTICOAGULANT SODIUM CITRATE 4% (200MG/5ML) IV SOLN: 5.0000 mL | Status: DC | PRN

## 2022-06-26 MED ORDER — HEPARIN SODIUM (PORCINE) 1000 UNIT/ML DIALYSIS: 1000.0000 [IU] | INTRAMUSCULAR | Status: DC | PRN

## 2022-06-26 MED ORDER — MIDODRINE HCL 5 MG PO TABS
ORAL_TABLET | ORAL | Status: AC
  Filled 2022-06-26: qty 1

## 2022-06-26 MED ORDER — ALTEPLASE 2 MG IJ SOLR: 2.0000 mg | Freq: Once | INTRAMUSCULAR | Status: DC | PRN

## 2022-06-26 MED ORDER — LIDOCAINE HCL (PF) 1 % IJ SOLN: 5.0000 mL | INTRAMUSCULAR | Status: DC | PRN

## 2022-06-26 MED ORDER — PENTAFLUOROPROP-TETRAFLUOROETH EX AERO: 1.0000 | INHALATION_SPRAY | CUTANEOUS | Status: DC | PRN

## 2022-06-26 NOTE — Progress Notes (Signed)
Established hemodialysis patient known at [place] 11:00am. Patient stated he rides with [employer]. No dialysis concerns were stated.

## 2022-06-26 NOTE — Progress Notes (Signed)
Frank [name]  MRN: [id number]  DOB/AGE: [birth year] 81 y.o.  Primary Care Physician:Hagan, Edmonia Lynch, NP  Admit date: 07/02/2022  Chief Complaint:  Chief Complaint  Patient presents with   Abdominal Pain    S-Pt presented on  07/04/2022 with  Chief Complaint  Patient presents with   Abdominal Pain  .   Patient is a 81 year old Caucasian male with a past medical history of diabetes mellitus, end-stage renal disease on hemodialysis on Tuesday Thursday Saturday schedule, hypothyroidism, depression, CVA, atrial fibrillation-on chronic anticoagulation therapy, anemia of chronic disease who was brought to the ER from [facility] assisted living with chief complaint of abdominal pain.   Patient was seen today in the dialysis unit Patient tolerating treatment well. Patient main concern today visit was about need to use a commode instead of a bedpan. I communicated his concerns with the nursing team   Medications   allopurinol  100 mg Oral Daily   ALPRAZolam  0.25 mg Oral Q supper   ALPRAZolam  0.25 mg Oral Q T,Th,Sa-HD   apixaban  2.5 mg Oral BID   atorvastatin  20 mg Oral Daily   Chlorhexidine Gluconate Cloth  6 each Topical Q0600   Chlorhexidine Gluconate Cloth  6 each Topical Q0600   cholecalciferol  2,000 Units Oral Daily   donepezil  5 mg Oral QHS   insulin aspart  0-6 Units Subcutaneous Q4H   levothyroxine  150 mcg Oral Daily   lidocaine-prilocaine  1 Application Topical See admin instructions   melatonin  10 mg Oral QHS   midodrine  10 mg Oral TID WC   midodrine  5 mg Oral 2 times per day on Tue Thu Sat   neomycin-bacitracin-polymyxin  1 Application Topical Once per day on Mon Thu   sevelamer carbonate  2,400 mg Oral TID WC   sodium chloride flush  5 mL Intracatheter Q8H   tiZANidine  2 mg Oral QHS   traZODone  50 mg Oral QHS   umeclidinium-vilanterol  1 puff Inhalation Daily         NWG:NFAOZ from the symptoms mentioned above,there are no other symptoms  referable to all systems reviewed.  Physical Exam: Vital signs in last 24 hours: Temp:  [97.8 F (36.6 C)-98.5 F (36.9 C)] 98.5 F (36.9 C) (10/17 0400) Pulse Rate:  [39-102] 80 (10/17 0600) Resp:  [11-31] 23 (10/17 0600) BP: (71-122)/(43-66) 118/61 (10/17 0600) SpO2:  [93 %-100 %] 96 % (10/17 0600) Weight change:  Last BM Date :  (PTA)  Intake/Output from previous day: 10/16 0701 - 10/17 0700 In: 1863.7 [P.O.:540; I.V.:810; IV Piggyback:513.7] Out: 100 [Drains:100] No intake/output data recorded.   Physical Exam:  General- pt is awake,alert, oriented to time place and person, Chronically ill-appearing  Resp- No acute REsp distress, Decreased breath sounds at bases  CVS- S1S2 regular in rate and rhythm  GIT- BS+, soft, NT,JP drain in situ  EXT- NO LE Edema, Cyanosis   Access-  AVF positive bruit and thrill  Lab Results:  CBC  Recent Labs    06/24/22 0538 06/25/22 0411  WBC 11.0* 12.4*  HGB 10.3* 10.5*  HCT 33.3* 34.2*  PLT 135* 156    BMET  Recent Labs    06/24/22 0538 06/25/22 0411  NA 134* 135  K 4.0 4.3  CL 96* 95*  CO2 29 29  GLUCOSE 102* 139*  BUN 28* 39*  CREATININE 4.23* 5.60*  CALCIUM 8.5* 9.2      Most recent Creatinine trend  Lab Results  Component Value Date   CREATININE 5.60 (H) 06/25/2022   CREATININE 4.23 (H) 06/24/2022   CREATININE 5.18 (H) 06/23/2022      MICRO   Recent Results (from the past 240 hour(s))  Blood culture (routine x 2)     Status: None (Preliminary result)   Collection Time: 07/05/2022  3:48 AM   Specimen: BLOOD  Result Value Ref Range Status   Specimen Description BLOOD LEFT FOREARM  Final   Special Requests   Final    BOTTLES DRAWN AEROBIC AND ANAEROBIC Blood Culture results may not be optimal due to an excessive volume of blood received in culture bottles   Culture   Final    NO GROWTH 3 DAYS Performed at [facility], [address] [place], [place] [address]    Report Status  PENDING  Incomplete  Blood culture (routine x 2)     Status: None (Preliminary result)   Collection Time: 06/10/2022  3:48 AM   Specimen: BLOOD  Result Value Ref Range Status   Specimen Description BLOOD LEFT ASSIST CONTROL  Final   Special Requests   Final    BOTTLES DRAWN AEROBIC AND ANAEROBIC Blood Culture results may not be optimal due to an excessive volume of blood received in culture bottles   Culture   Final    NO GROWTH 3 DAYS Performed at [facility], [address] [place], [place] [address]    Report Status PENDING  Incomplete  SARS Coronavirus 2 by RT PCR (hospital order, performed in [place] hospital lab) *cepheid single result test* Anterior Nasal Swab     Status: None   Collection Time: 06/21/2022 12:07 PM   Specimen: Anterior Nasal Swab  Result Value Ref Range Status   SARS Coronavirus 2 by RT PCR NEGATIVE NEGATIVE Final    Comment: (NOTE) SARS-CoV-2 target nucleic acids are NOT DETECTED.  The SARS-CoV-2 RNA is generally detectable in upper and lower respiratory specimens during the acute phase of infection. The lowest concentration of SARS-CoV-2 viral copies this assay can detect is 250 copies / mL. A negative result does not preclude SARS-CoV-2 infection and should not be used as the sole basis for treatment or other patient management decisions.  A negative result may occur with improper specimen collection / handling, submission of specimen other than nasopharyngeal swab, presence of viral mutation(s) within the areas targeted by this assay, and inadequate number of viral copies (<250 copies / mL). A negative result must be combined with clinical observations, patient history, and epidemiological information.  Fact Sheet for Patients:   https://www.patel.info/  Fact Sheet for Healthcare Providers: https://hall.com/  This test is not yet approved or  cleared by the [place] FDA and has been  authorized for detection and/or diagnosis of SARS-CoV-2 by FDA under an Emergency Use Authorization (EUA).  This EUA will remain in effect (meaning this test can be used) for the duration of the COVID-19 declaration under Section 564(b)(1) of the Act, 21 U.S.C. section 360bbb-3(b)(1), unless the authorization is terminated or revoked sooner.  Performed at [facility], [address] [place], [place] [address]   MRSA Next Gen by PCR, Nasal     Status: None   Collection Time: 07/09/2022 12:58 PM   Specimen: Nasal Mucosa; Nasal Swab  Result Value Ref Range Status   MRSA by PCR Next Gen NOT DETECTED NOT DETECTED Final    Comment: (NOTE) The GeneXpert MRSA Assay (FDA approved for NASAL specimens only), is  one component of a comprehensive MRSA colonization surveillance program. It is not intended to diagnose MRSA infection nor to guide or monitor treatment for MRSA infections. Test performance is not FDA approved in patients less than 110 years old. Performed at [facility], [address] [place], [place] [address]   Aerobic/Anaerobic Culture w Gram Stain (surgical/deep wound)     Status: None (Preliminary result)   Collection Time: 06/17/2022  2:43 PM   Specimen: BILE  Result Value Ref Range Status   Specimen Description   Final    BILE Performed at [facility], [address], [place], [place] [address]    Special Requests   Final    NONE Performed at [facility], [place], [place] [address]    Gram Stain NO WBC SEEN FEW GRAM NEGATIVE RODS   Final   Culture   Final    FEW KLEBSIELLA OXYTOCA ABUNDANT ENTEROCOCCUS RAFFINOSUS HOLDING FOR POSSIBLE ANAEROBE Performed at [facility], [place] [address] [place], [place] [address]    Report Status PENDING  Incomplete   Organism ID, Bacteria KLEBSIELLA OXYTOCA  Final   Organism ID, Bacteria ENTEROCOCCUS RAFFINOSUS  Final      Susceptibility   Klebsiella oxytoca - MIC*     AMPICILLIN RESISTANT Resistant     CEFAZOLIN <=4 SENSITIVE Sensitive     CEFEPIME <=0.12 SENSITIVE Sensitive     CEFTAZIDIME <=1 SENSITIVE Sensitive     CEFTRIAXONE <=0.25 SENSITIVE Sensitive     CIPROFLOXACIN <=0.25 SENSITIVE Sensitive     GENTAMICIN <=1 SENSITIVE Sensitive     IMIPENEM <=0.25 SENSITIVE Sensitive     TRIMETH/SULFA <=20 SENSITIVE Sensitive     AMPICILLIN/SULBACTAM <=2 SENSITIVE Sensitive     PIP/TAZO <=4 SENSITIVE Sensitive     * FEW KLEBSIELLA OXYTOCA   Enterococcus raffinosus - MIC*    AMPICILLIN 16 RESISTANT Resistant     VANCOMYCIN <=0.5 SENSITIVE Sensitive     GENTAMICIN SYNERGY SENSITIVE Sensitive     * ABUNDANT ENTEROCOCCUS RAFFINOSUS         Impression:   1)Renal     End-stage renal disease Patient is on hemodialysis Patient is on Tuesday Thursday Saturday schedule Patient undergoes dialysis at [facility] under the care of Dr. Graylon Gunning We will dialyze patient today     2)Hypotension Patient is on midodrine .    3)Anemia of chronic disease     Latest Ref Rng & Units 06/25/2022    4:11 AM 06/24/2022    5:38 AM 06/23/2022    5:07 AM  CBC  WBC 4.0 - 10.5 K/uL 12.4  11.0  12.4   Hemoglobin 13.0 - 17.0 g/dL 10.5  10.3  10.3   Hematocrit 39.0 - 52.0 % 34.2  33.3  33.9   Platelets 150 - 400 K/uL 156  135  123        HGb at goal (9--11) We will keep patient on Epogen during dialysis  4) Secondary hyperparathyroidism -CKD Mineral-Bone Disorder    Lab Results  Component Value Date   PTH 184 (H) 01/23/2019   CALCIUM 9.2 06/25/2022   CAION 1.15 10/27/2021   PHOS 4.2 06/26/2022    Secondary Hyperparathyroidism present   Phosphorus at goal.   5)Acute cholecystitis Patient is being followed by primary team, surgery team Patient is to have percutaneous cholecystostomy tube   6) Electrolytes      Latest Ref Rng & Units 06/25/2022    4:11 AM  06/24/2022    5:38 AM 06/23/2022    5:07 AM  BMP  Glucose  70 - 99 mg/dL 139  102  159   BUN 8 - 23 mg/dL 39  28  37   Creatinine 0.61 - 1.24 mg/dL 5.60  4.23  5.18   Sodium 135 - 145 mmol/L 135  134  137   Potassium 3.5 - 5.1 mmol/L 4.3  4.0  3.9   Chloride 98 - 111 mmol/L 95  96  97   CO2 22 - 32 mmol/L 29  29  27    Calcium 8.9 - 10.3 mg/dL 9.2  8.5  7.8      Sodium Hyponatremia Patient sodium is low secondary to ESRD/inability to get rid of free water   Potassium Normokalemic    7)Acid base  Co2 at goal     Plan:    Patient will be dialyzed today   Addendum  patient was seen on dialysis unit Patient tolerated treatment well    Debi Cousin s [facility] 06/26/2022, 7:48 AM

## 2022-06-26 NOTE — Progress Notes (Signed)
PT did 3 hrs of HD with no issue or signs of distress  UF = 1000 ml    06/26/22 1347  Vitals  BP 115/64  MAP (mmHg) 76  BP Location Left Arm  BP Method Automatic  Patient Position (if appropriate) Lying  Pulse Rate 89  Pulse Rate Source Monitor  ECG Heart Rate 84  Resp (!) 22  Oxygen Therapy  SpO2 99 %  O2 Device Nasal Cannula  O2 Flow Rate (L/min) 2 L/min  Patient Activity (if Appropriate) In bed  Pulse Oximetry Type Continuous  Post Treatment  Dialyzer Clearance Lightly streaked  Duration of HD Treatment -hour(s) 3 hour(s)  Hemodialysis Intake (mL) 0 mL  Liters Processed 54  Fluid Removed 1000 mL  Tolerated HD Treatment Yes (pt tolerated tx. RN gave report to ICU nurse. no conerns/complaints at this time. pt is dstable and vitals are stable. awaiting transport)  Post-Hemodialysis Comments see note above.  AVG/AVF Arterial Site Held (minutes) 6 minutes  AVG/AVF Venous Site Held (minutes) 6 minutes  Note  Observations post tx vitals  Fistula / Graft Right Upper arm Arteriovenous fistula  Placement Date/Time: 10/27/21 1007   Orientation: Right  Access Location: Upper arm  Access Type: Arteriovenous fistula  Site Condition No complications  Fistula / Graft Assessment Present;Bruit;Thrill  Status Deaccessed  Needle Size 15g

## 2022-06-26 NOTE — Progress Notes (Signed)
PT Cancellation Note  Patient Details Name: Frank [name] MRN: [id number] DOB: 07-20-1941   Cancelled Treatment:    Reason Eval/Treat Not Completed: Other (comment). Treatment attempted, pt out of room for HD. Will re-attempt, time permitting   Ellis Mehaffey 06/26/2022, 10:52 AM Greggory Stallion, PT, DPT, GCS [phone]

## 2022-06-26 NOTE — Plan of Care (Signed)
Consult for [place] noted. Patient is currently out of room and off unit. Will reattempt at another time.

## 2022-06-26 NOTE — Progress Notes (Signed)
PT Cancellation Note  Patient Details Name: Frank [name] MRN: [id number] DOB: 1941/01/22   Cancelled Treatment:    Reason Eval/Treat Not Completed: Other (comment). 2nd attempt. Pt back from HD, however is very sleepy and family is requesting to defer PT until next date. Requesting pt's rollator if possible as pt is very familiar with AD at [facility]. Will re-attempt next date.   Frank [name] 06/26/2022, 3:50 PM Greggory Stallion, PT, DPT, GCS [phone]

## 2022-06-26 NOTE — Progress Notes (Signed)
PROGRESS NOTE    Frank [name]  JKD:[id number] DOB: 07-Nov-1940 DOA: 06/12/2022 PCP: Verl Blalock, NP    Brief Narrative:  81 yo male who presented to [facility] ER on 10/13 with a 24 hr hx of RUQ abdominal pain, nausea, and vomiting.  He also endorsed subjective fevers and chills. Remains on pressors in ICU   ED Course  Upon arrival to the ER lab results revealed K+ 3.2/chloride 94/ glucose 163/creatinine 4.05/calcium 8.7/anion gap 19/troponin 49/lactic acid 2.5/pct 6.51/wbc 3.1/PT 17.3/INR 1.4.  CT Abd/Pelvis concerning for cholelithiasis with gallbladder wall thickening and edema suggesting acute cholecystitis.  Pt received zosyn.  General surgery consulted by ER provider and IR consulted per recommendations for cholecystostomy tube placement.  PT admitted to the stepdown unit per hospitalist team for additional workup and treatment.     Initial ER vital signs: temp 100 F, bp 95/49/hr 88/resp rate 18/O2 sats 97% on RA Initial EKG: Atrial fibrillation with PVC's and RBBB, hr 91 CT Abd Pelvis 10/13: Cholelithiasis with gallbladder wall thickening and edema suggesting acute cholecystitis. Small amount of free fluid in the pelvis. Increased density in the fluid may indicate hemorrhagic fluid or proteinaceous fluid. Source is not identified. Prostate gland enlargement. Bladder wall thickening likely due to outlet obstruction but could indicate cystitis. Small right pleural effusion with basilar atelectasis or consolidation.     10/13: Admitted with sepsis secondary to acute cholecystitis IR consulted for cholecystostomy tube placement.  PCCM team consulted due to soft bp's may require vasopressors    CXR 10/13: Bibasilar atelectasis   Despite 2L fluid bolus bp's remained soft PCCM team consulted for possible need of vasopressors.     06/23/22- patient seen and examined at bedside.  He remains critically ill on levophed with chronic aphasic speech post previous stroke.  He still has active  return form GB drain but seems clinically improved. He reports ongoing RUQ pain and tenderness.   He is for HD today.    06/24/22- patient is mildly improved, he is more awake and interactive.  He is asking if he can eat and surgery has cleared him to start PO.  He is for swallow evaluation today, hx of CVA.  Ongoing active bilious return from cholecystostomy drain. Still has RUQ pain/tenderness but improved.  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     10/16 remains on pressors  10/17 TRH pickup     Consultants:  Nephrology, pccm, palliative care  Procedures: HD  Antimicrobials:      Subjective: In HD, doesn't complain of anything . Denies sob or cp  Objective: Vitals:   06/26/22 1330 06/26/22 1341 06/26/22 1347 06/26/22 1400  BP: 112/67 125/72 115/64   Pulse: 86 95 89   Resp: (!) 22 (!) 21 (!) 22   Temp:  97.6 F (36.4 C)    TempSrc:  Oral    SpO2: 99% 99% 99%   Weight:    90.1 kg  Height:        Intake/Output Summary (Last 24 hours) at 06/26/2022 1522 Last data filed at 06/26/2022 1347 Gross per 24 hour  Intake 821.18 ml  Output 1030 ml  Net -208.82 ml   Filed Weights   06/23/22 1821 06/23/22 2129 06/26/22 1400  Weight: 88.8 kg 87.8 kg 90.1 kg    Examination: Calm, NAD, weak appearing Cta no w/r anteriorly Reg s1/s2 no gallop Soft benign +bs No edema Awake. Mumbles when answering Mood and affect appropriate in current setting  Data Reviewed: I have personally reviewed following labs and imaging studies  CBC: Recent Labs  Lab 07/01/2022 0110 06/23/22 0507 06/24/22 0538 06/25/22 0411  WBC 3.1* 12.4* 11.0* 12.4*  HGB 13.0 10.3* 10.3* 10.5*  HCT 43.5 33.9* 33.3* 34.2*  MCV 100.9* 99.7 101.5* 100.3*  PLT 153 123* 135* 952   Basic Metabolic Panel: Recent Labs  Lab 07/10/2022 0110 06/21/2022 2012 06/23/22 0507 06/24/22 0538 06/25/22 0411 06/26/22 0425  NA 138 136 137 134* 135  --   K 3.2* 4.0 3.9 4.0 4.3  --   CL 94* 97* 97* 96* 95*   --   CO2 25 28 27 29 29   --   GLUCOSE 163* 121* 159* 102* 139*  --   BUN 22 33* 37* 28* 39*  --   CREATININE 4.05* 5.11* 5.18* 4.23* 5.60*  --   CALCIUM 8.7* 8.2* 7.8* 8.5* 9.2  --   MG  --  1.8 1.8 2.0 2.1 2.2  PHOS  --   --  3.9 3.0 3.5 4.2   GFR: Estimated Creatinine Clearance: 11.5 mL/min (A) (by C-G formula based on SCr of 5.6 mg/dL (H)). Liver Function Tests: Recent Labs  Lab 06/17/2022 0110  AST 23  ALT 9  ALKPHOS 78  BILITOT 1.2  PROT 7.6  ALBUMIN 4.0   Recent Labs  Lab 06/14/2022 0110  LIPASE 38   No results for input(s): "AMMONIA" in the last 168 hours. Coagulation Profile: Recent Labs  Lab 06/22/2022 0348 06/23/22 0507  INR 1.4* 1.9*   Cardiac Enzymes: No results for input(s): "CKTOTAL", "CKMB", "CKMBINDEX", "TROPONINI" in the last 168 hours. BNP (last 3 results) No results for input(s): "PROBNP" in the last 8760 hours. HbA1C: No results for input(s): "HGBA1C" in the last 72 hours. CBG: Recent Labs  Lab 06/25/22 1620 06/25/22 1943 06/25/22 2341 06/26/22 0356 06/26/22 0720  GLUCAP 125* 121* 114* 101* 104*   Lipid Profile: No results for input(s): "CHOL", "HDL", "LDLCALC", "TRIG", "CHOLHDL", "LDLDIRECT" in the last 72 hours. Thyroid Function Tests: No results for input(s): "TSH", "T4TOTAL", "FREET4", "T3FREE", "THYROIDAB" in the last 72 hours. Anemia Panel: No results for input(s): "VITAMINB12", "FOLATE", "FERRITIN", "TIBC", "IRON", "RETICCTPCT" in the last 72 hours. Sepsis Labs: Recent Labs  Lab 07/04/2022 0348 06/20/2022 0914 06/23/22 0507 06/24/22 0538  PROCALCITON 6.51  --  35.56 33.92  LATICACIDVEN 2.5* 1.3  --   --     Recent Results (from the past 240 hour(s))  Blood culture (routine x 2)     Status: None (Preliminary result)   Collection Time: 06/21/2022  3:48 AM   Specimen: BLOOD  Result Value Ref Range Status   Specimen Description BLOOD LEFT FOREARM  Final   Special Requests   Final    BOTTLES DRAWN AEROBIC AND ANAEROBIC Blood Culture  results may not be optimal due to an excessive volume of blood received in culture bottles   Culture   Final    NO GROWTH 4 DAYS Performed at [facility], [address], [place], [place] [address]    Report Status PENDING  Incomplete  Blood culture (routine x 2)     Status: None (Preliminary result)   Collection Time: 06/24/2022  3:48 AM   Specimen: BLOOD  Result Value Ref Range Status   Specimen Description BLOOD LEFT ASSIST CONTROL  Final   Special Requests   Final    BOTTLES DRAWN AEROBIC AND ANAEROBIC Blood Culture results may not be optimal due to an excessive volume of blood  received in culture bottles   Culture   Final    NO GROWTH 4 DAYS Performed at [facility], [address] [place], [place] [address]    Report Status PENDING  Incomplete  SARS Coronavirus 2 by RT PCR (hospital order, performed in [facility] hospital lab) *cepheid single result test* Anterior Nasal Swab     Status: None   Collection Time: 07/01/2022 12:07 PM   Specimen: Anterior Nasal Swab  Result Value Ref Range Status   SARS Coronavirus 2 by RT PCR NEGATIVE NEGATIVE Final    Comment: (NOTE) SARS-CoV-2 target nucleic acids are NOT DETECTED.  The SARS-CoV-2 RNA is generally detectable in upper and lower respiratory specimens during the acute phase of infection. The lowest concentration of SARS-CoV-2 viral copies this assay can detect is 250 copies / mL. A negative result does not preclude SARS-CoV-2 infection and should not be used as the sole basis for treatment or other patient management decisions.  A negative result may occur with improper specimen collection / handling, submission of specimen other than nasopharyngeal swab, presence of viral mutation(s) within the areas targeted by this assay, and inadequate number of viral copies (<250 copies / mL). A negative result must be combined with clinical observations, patient history, and epidemiological information.  Fact Sheet for  Patients:   https://www.patel.info/  Fact Sheet for Healthcare Providers: https://hall.com/  This test is not yet approved or  cleared by the [place] FDA and has been authorized for detection and/or diagnosis of SARS-CoV-2 by FDA under an Emergency Use Authorization (EUA).  This EUA will remain in effect (meaning this test can be used) for the duration of the COVID-19 declaration under Section 564(b)(1) of the Act, 21 U.S.C. section 360bbb-3(b)(1), unless the authorization is terminated or revoked sooner.  Performed at [facility], [address] [place], [place] [address]   MRSA Next Gen by PCR, Nasal     Status: None   Collection Time: 06/11/2022 12:58 PM   Specimen: Nasal Mucosa; Nasal Swab  Result Value Ref Range Status   MRSA by PCR Next Gen NOT DETECTED NOT DETECTED Final    Comment: (NOTE) The GeneXpert MRSA Assay (FDA approved for NASAL specimens only), is one component of a comprehensive MRSA colonization surveillance program. It is not intended to diagnose MRSA infection nor to guide or monitor treatment for MRSA infections. Test performance is not FDA approved in patients less than 71 years old. Performed at [facility], [address], [place], [place] [address]   Aerobic/Anaerobic Culture w Gram Stain (surgical/deep wound)     Status: None   Collection Time: 06/11/2022  2:43 PM   Specimen: BILE  Result Value Ref Range Status   Specimen Description   Final    BILE Performed at [facility], [address] [place], [place] [address]    Special Requests   Final    NONE Performed at [facility], [place], [place] [address]    Gram Stain NO WBC SEEN FEW GRAM NEGATIVE RODS   Final   Culture   Final    FEW KLEBSIELLA OXYTOCA ABUNDANT ENTEROCOCCUS RAFFINOSUS MODERATE BACTEROIDES CACCAE BETA LACTAMASE POSITIVE Performed at [facility], [place] [address], [place], [place] [address]    Report Status 06/26/2022 FINAL  Final   Organism ID, Bacteria KLEBSIELLA OXYTOCA  Final   Organism ID, Bacteria ENTEROCOCCUS RAFFINOSUS  Final      Susceptibility   Klebsiella oxytoca -  MIC*    AMPICILLIN RESISTANT Resistant     CEFAZOLIN <=4 SENSITIVE Sensitive     CEFEPIME <=0.12 SENSITIVE Sensitive     CEFTAZIDIME <=1 SENSITIVE Sensitive     CEFTRIAXONE <=0.25 SENSITIVE Sensitive     CIPROFLOXACIN <=0.25 SENSITIVE Sensitive     GENTAMICIN <=1 SENSITIVE Sensitive     IMIPENEM <=0.25 SENSITIVE Sensitive     TRIMETH/SULFA <=20 SENSITIVE Sensitive     AMPICILLIN/SULBACTAM <=2 SENSITIVE Sensitive     PIP/TAZO <=4 SENSITIVE Sensitive     * FEW KLEBSIELLA OXYTOCA   Enterococcus raffinosus - MIC*    AMPICILLIN 16 RESISTANT Resistant     VANCOMYCIN <=0.5 SENSITIVE Sensitive     GENTAMICIN SYNERGY SENSITIVE Sensitive     * ABUNDANT ENTEROCOCCUS RAFFINOSUS         Radiology Studies: No results found.      Scheduled Meds:  allopurinol  100 mg Oral Daily   ALPRAZolam  0.25 mg Oral Q supper   ALPRAZolam  0.25 mg Oral Q T,Th,Sa-HD   apixaban  2.5 mg Oral BID   atorvastatin  20 mg Oral Daily   Chlorhexidine Gluconate Cloth  6 each Topical Q0600   Chlorhexidine Gluconate Cloth  6 each Topical Q0600   cholecalciferol  2,000 Units Oral Daily   donepezil  5 mg Oral QHS   insulin aspart  0-6 Units Subcutaneous Q4H   levothyroxine  150 mcg Oral Daily   lidocaine-prilocaine  1 Application Topical See admin instructions   melatonin  10 mg Oral QHS   midodrine       midodrine  10 mg Oral TID WC   midodrine  5 mg Oral 2 times per day on Tue Thu Sat   neomycin-bacitracin-polymyxin  1 Application Topical Once per day on Mon Thu   sevelamer carbonate  2,400 mg Oral TID WC   sodium chloride flush  5 mL Intracatheter Q8H   tiZANidine  2 mg Oral QHS   traZODone  50 mg Oral QHS   umeclidinium-vilanterol  1 puff Inhalation Daily   Continuous Infusions:   sodium chloride     ampicillin-sulbactam (UNASYN) IV 3 g (06/26/22 0904)   anticoagulant sodium citrate     lactated ringers Stopped (06/25/22 1625)   vancomycin 1,000 mg (06/26/22 1236)    Assessment & Plan:   Principal Problem:   Sepsis ([place]) Active Problems:   Cholecystitis   Diabetes mellitus with ESRD (end-stage renal disease) ([place])   Hypothyroidism   Elevated troponin   Coronary artery disease involving native coronary artery of native heart without angina pectoris   Atrial fibrillation, chronic (HCC)   Pressure injury of skin      D58 yo white male admitted for severe sepsis and septic shock POA due to acute infected GB   Sepsis secondary to acute cholecystitis Was on pressors Gsx following peripherally IR consulted  for cholecystostomy tube on 10/13 On unasyn Continue midodrine   Hypothyroidsm On synthroid  Elevated Troponin  Hx: Essential HTN, HLD, PVD, and Stroke  2/2 demand ischemia   Diabetes Mellitus with ESRD ON HD -continue Foley Catheter-assess need 10/17 HD today BG stable RISS  Chronic afib On a/c    VT prophylaxis: eliquis Code Status:dnr Family Communication: none Disposition Plan:  Status is: Inpatient Remains inpatient appropriate because: iv treatment        LOS: 4 days   Time spent: 35 min    Nolberto Hanlon, MD [facility] Pager [phone]  If 7PM-7AM, please contact  night-coverage 06/26/2022, 3:22 PM

## 2022-06-27 DIAGNOSIS — I2510 Atherosclerotic heart disease of native coronary artery without angina pectoris: Secondary | ICD-10-CM | POA: Diagnosis not present

## 2022-06-27 DIAGNOSIS — A419 Sepsis, unspecified organism: Secondary | ICD-10-CM | POA: Diagnosis not present

## 2022-06-27 DIAGNOSIS — Z7189 Other specified counseling: Secondary | ICD-10-CM

## 2022-06-27 DIAGNOSIS — K819 Cholecystitis, unspecified: Secondary | ICD-10-CM | POA: Diagnosis not present

## 2022-06-27 DIAGNOSIS — I482 Chronic atrial fibrillation, unspecified: Secondary | ICD-10-CM | POA: Diagnosis not present

## 2022-06-27 DIAGNOSIS — R7989 Other specified abnormal findings of blood chemistry: Secondary | ICD-10-CM

## 2022-06-27 DIAGNOSIS — E039 Hypothyroidism, unspecified: Secondary | ICD-10-CM

## 2022-06-27 LAB — CULTURE, BLOOD (ROUTINE X 2)
Culture: NO GROWTH
Culture: NO GROWTH

## 2022-06-27 LAB — MAGNESIUM: Magnesium: 2 mg/dL (ref 1.7–2.4)

## 2022-06-27 LAB — GLUCOSE, CAPILLARY
Glucose-Capillary: 102 mg/dL — ABNORMAL HIGH (ref 70–99)
Glucose-Capillary: 108 mg/dL — ABNORMAL HIGH (ref 70–99)
Glucose-Capillary: 102 mg/dL — ABNORMAL HIGH (ref 70–99)
Glucose-Capillary: 127 mg/dL — ABNORMAL HIGH (ref 70–99)
Glucose-Capillary: 116 mg/dL — ABNORMAL HIGH (ref 70–99)
Glucose-Capillary: 113 mg/dL — ABNORMAL HIGH (ref 70–99)

## 2022-06-27 LAB — PHOSPHORUS: Phosphorus: 3.6 mg/dL (ref 2.5–4.6)

## 2022-06-27 NOTE — Plan of Care (Signed)

## 2022-06-27 NOTE — Progress Notes (Signed)
Physical Therapy Treatment Patient Details Name: Frank [name] MRN: [id number] DOB: 08/20/1941 Today's Date: 06/27/2022   History of Present Illness Pt is an 81 y/o M admitted on 06/17/2022 after presenting with a 24 hr hx of RUQ abdominal pain, N&V, fevers & chills. CT Abd/Pelvis concerning for cholelithiasis with gallbladder wall thickening and edema suggesting acute cholecystitis. It was recommended pt have cholecystostomy tube placement. PMH: DM2, neuropathy, HTN, HLD, PVD, stroke, 2nd degree heart block    PT Comments    Pt is making gradual progress towards goals, however continues to struggle with severe weakness and decreased physical mobility. Needed +2 for OOB this date and needs frequent reminders for attention to task. RW used for transfer from bed->chair. Not safe to attempt ambulation this date due to weakness/balance/cognition. Continue to recommend SNF as pt is far removed from baseline. Will continue to progress while admitted.   Recommendations for follow up therapy are one component of a multi-disciplinary discharge planning process, led by the attending physician.  Recommendations may be updated based on patient status, additional functional criteria and insurance authorization.  Follow Up Recommendations  Skilled nursing-short term rehab (<3 hours/day) Can patient physically be transported by private vehicle: No   Assistance Recommended at Discharge Intermittent Supervision/Assistance  Patient can return home with the following Two people to help with walking and/or transfers;A lot of help with bathing/dressing/bathroom   Equipment Recommendations  Rolling walker (2 wheels)    Recommendations for Other Services       Precautions / Restrictions Precautions Precautions: Fall Restrictions Weight Bearing Restrictions: No     Mobility  Bed Mobility Overal bed mobility: Needs Assistance Bed Mobility: Supine to Sit     Supine to sit: Max assist     General bed  mobility comments: poor initiation. Needs max assist for sliding B LEs and once at EOB, poor seated balance with min/mod assist to maintain upright posture with post bias. Balance improves with cues. Heavy assist required for scooting towards EOB.    Transfers Overall transfer level: Needs assistance Equipment used: Rolling walker (2 wheels) Transfers: Bed to chair/wheelchair/BSC     Step pivot transfers: Mod assist, +2 physical assistance, From elevated surface       General transfer comment: needs cues for initiation and bed elevated. Attempt with +1 and unable to stand. +2 called and pt able to stand and step over to recliner. Quick fatigue noted    Ambulation/Gait               General Gait Details: not safe to perform this date   Stairs             Wheelchair Mobility    Modified Rankin (Stroke Patients Only)       Balance Overall balance assessment: Needs assistance Sitting-balance support: Feet supported, Bilateral upper extremity supported Sitting balance-Leahy Scale: Poor     Standing balance support: During functional activity, Bilateral upper extremity supported Standing balance-Leahy Scale: Poor                              Cognition Arousal/Alertness: Awake/alert Behavior During Therapy: WFL for tasks assessed/performed Overall Cognitive Status: Impaired/Different from baseline                                 General Comments: Pt confused and only alert to self only. Pt quickly loses train  of thought and needs cues for tasking. Pt perseverating on low back itching.        Exercises Other Exercises Other Exercises: supine ther-ex performed on B LE including hip abd/add, SLRs, and heel slides. 10 reps with poor focus. All mobility performed on 2L of O2 with sats WNL    General Comments        Pertinent Vitals/Pain Pain Assessment Pain Assessment: No/denies pain    Home Living                           Prior Function            PT Goals (current goals can now be found in the care plan section) Acute Rehab PT Goals Patient Stated Goal: to go to sleep PT Goal Formulation: With patient Time For Goal Achievement: 07/08/22 Potential to Achieve Goals: Good Progress towards PT goals: Progressing toward goals    Frequency    Min 2X/week      PT Plan Current plan remains appropriate    Co-evaluation              AM-PAC PT "6 Clicks" Mobility   Outcome Measure  Help needed turning from your back to your side while in a flat bed without using bedrails?: A Lot Help needed moving from lying on your back to sitting on the side of a flat bed without using bedrails?: A Lot Help needed moving to and from a bed to a chair (including a wheelchair)?: A Lot Help needed standing up from a chair using your arms (e.g., wheelchair or bedside chair)?: A Lot Help needed to walk in hospital room?: Total Help needed climbing 3-5 steps with a railing? : Total 6 Click Score: 10    End of Session Equipment Utilized During Treatment: Oxygen Activity Tolerance: Patient limited by fatigue Patient left: in chair;with call bell/phone within reach Nurse Communication: Mobility status PT Visit Diagnosis: Muscle weakness (generalized) (M62.81);Difficulty in walking, not elsewhere classified (R26.2)     Time: 6195-0932 PT Time Calculation (min) (ACUTE ONLY): 23 min  Charges:  $Therapeutic Exercise: 8-22 mins $Therapeutic Activity: 8-22 mins                     Greggory Stallion, PT, DPT, GCS [phone]    Frank [name] 06/27/2022, 1:28 PM

## 2022-06-27 NOTE — Evaluation (Signed)
Clinical/Bedside Swallow Evaluation Patient Details  Name: Frank [name] MRN: [id number] Date of Birth: 1941/03/31  Today's Date: 06/27/2022 Time: SLP Start Time (ACUTE ONLY): 0932 SLP Stop Time (ACUTE ONLY): 3557 SLP Time Calculation (min) (ACUTE ONLY): 60 min  Past Medical History:  Past Medical History:  Diagnosis Date   Anemia of chronic kidney failure    Arthritis    Benign prostate hyperplasia    Bradycardia    Cataracts, bilateral 05/15/2012   Chronic idiopathic gout of multiple sites 02/10/2015   Chronic kidney disease    [facility]- ESRD   Depression    Diabetes mellitus without complication ([place])    diet controlled    Diabetic nephropathy ([place])    ED (erectile dysfunction) 08/28/2012   Elevated liver enzymes 06/10/2019   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 HTN PLAN   BP goal is <140/90 BP Readings from Last 3 Encounters:  08/27/16 140/79  08/13/16 141/80  08/06/16 144/86   Current Anti-Hyptensive Medications: None  Today's Recommendations: Continue lifestyle modifications. Defer starting medication to PCP.  Recommend continued regular exercise as tolerated. Recomm   History of UTI    s/p foley in hospital 01/2019    Hyperlipidemia    Hypothyroidism    Illiterate 11/19/2013   Last Assessment & Plan:  Assisted patient with Handicapped Placard application today.     Insomnia 05/08/2019   Neuropathy    Peripheral neuropathy    Peripheral vascular disease ([place])    Second degree heart block    Secondary hyperparathyroidism ([place])    Renal   Stroke ([place]) 2021   Thyroid disease    Past Surgical History:  Past Surgical History:  Procedure Laterality Date   AV FISTULA PLACEMENT Left 11/19/2017   Procedure: ARTERIOVENOUS (AV) FISTULA CREATION;  Surgeon: [name], MD;  Location: [place];  Service: Vascular;  Laterality: Left;   AV FISTULA PLACEMENT Right 10/27/2021   Procedure: CREATION OF BRACHIOCEPHALIC RIGHT ARM ARTERIOVENOUS (AV) FISTULA;  Surgeon: [name], MD;  Location: [place];  Service: Vascular;  Laterality: Right;   [place] Left 06/04/2018   Procedure: BASILIC VEIN TRANSPOSITION ARM;  Surgeon: [name], MD;  Location: [place];  Service: Vascular;  Laterality: Left;   COLONOSCOPY     DIALYSIS/PERMA CATHETER REMOVAL N/A 03/23/2019   Procedure: DIALYSIS/PERMA CATHETER REMOVAL;  Surgeon: [name], MD;  Location: [place] [facility];  Service: Cardiovascular;  Laterality: N/A;   EYE SURGERY     cataract surgery b/l; photophobia since    INSERTION OF DIALYSIS CATHETER N/A 01/23/2019   Procedure: INSERTION OF DIALYSIS CATHETER;  Surgeon: [name], MD;  Location: [facility];  Service: Vascular;  Laterality: N/A;  INSERTION OF DIALYSIS CATHETER   INSERTION OF DIALYSIS CATHETER  2022   right subclavian (chest area)   IR ANGIO INTRA EXTRACRAN SEL COM CAROTID INNOMINATE UNI L MOD SED  10/15/2019   IR ANGIO VERTEBRAL SEL SUBCLAVIAN INNOMINATE UNI R MOD SED  10/15/2019   IR CT HEAD LTD  10/15/2019   IR FLUORO GUIDE CV LINE RIGHT  10/08/2019   IR INTRAVSC STENT CERV CAROTID W/EMB-PROT MOD SED INCL ANGIO  10/15/2019   IR PERC CHOLECYSTOSTOMY  06/23/2022   IR THROMBECTOMY AV FISTULA W/THROMBOLYSIS/PTA INC/SHUNT/IMG LEFT Left 10/09/2019  IR US GUIDE VASC ACCESS LEFT  10/09/2019   IR US GUIDE VASC ACCESS RIGHT  10/08/2019   KNEE ARTHROSCOPY Left    Knee   PERIPHERAL VASCULAR THROMBECTOMY Left 07/06/2019   Procedure: PERIPHERAL VASCULAR THROMBECTOMY;  Surgeon: [name], MD;  Location: [facility] [place] [facility];  Service: Cardiovascular;  Laterality: Left;   RADIOLOGY WITH ANESTHESIA N/A 10/15/2019   Procedure: stent placement;  Surgeon: Luanne Bras, MD;  Location: [place];  Service: Radiology;  Laterality: N/A;    THROMBECTOMY W/ EMBOLECTOMY Left 01/27/2019   Procedure: THROMBECTOMY Left arm ARTERIOVENOUS FISTULA  and Left arm Arteriovenous gortex graft;  Surgeon: [name], MD;  Location: [facility] OR;  Service: Vascular;  Laterality: Left;   VENOGRAM Left 01/27/2019   Procedure: Left arm FISTULOGRAM/;  Surgeon: [name], MD;  Location: [place];  Service: Vascular;  Laterality: Left;   HPI:  Frank [name] is a 81 y.o. male with PMH significant for ESRD-HD-TTS, DM2, HTN, HLD, CAD, A-fib on Eliquis, CVA, PAD, chronic anemia, hypothyroidism, depression, BPH, gout who lives at [facility] assisted living.  10/13, patient presented to the ED for evaluation of epigastric, right upper quadrant pain, stated with nausea, vomiting.  In the ED, patient had temperature of 103, heart rate 98, blood pressure in 90s which later dropped down to 80s.  Initially on room air but later required up to 4 L of oxygen. CT abdomen showed cholelithiasis with gallbladder wall thickening and edema suggesting acute cholecystitis. Percutaneous cholecystostomy tube placement by IR on 07/05/2022. CXR 10/13: Bibasilar atelectasis. Repeat CXR 06/23/2022 Mild interval worsening in lung aeration. Specifically, opacity  in the right lung base has increased. This is suspected to be a  combination pleural fluid and atelectasis and/or pneumonia.  MRI in 2021: "Underlying moderately advanced age-related cerebral atrophy with  mild chronic small vessel ischemic disease.".    Assessment / Plan / Recommendation  Clinical Impression   Pt seen for BSE this date -- NSG requested a reassessment d/t increased coughing at Breakfast meal this morning. Pt was awake, sitting in chair. Pleasant and engaged w/ this SLP but only to direct questions w/ noted paucity of speech w/ min delayed responses. Unsure of pt's Baseline Cognitive functioning(?). Pt lives at [facility] w/ assistance w/ ADLs per chart.  Pt is on RA, afebrile. No repeat CXR in chart. NSG reported  no decline in pulmonary status since this morning.   OF NOTE: pt's lower Dentures are min loose and ill-fitting. This could have impact on effective mastication and oral clearing. When asked about using adhesive to secure the Dentures better, pt declined.   Pt appears to present w/ grossly adequate oropharyngeal phase swallow function w/ No overt oropharyngeal phase dysphagia noted when following general aspiration precautions. No oral motor weakness noted. Pt consumed po trials w/ No immediate, overt clinical s/s of aspiration during po trials. Pt appears at reduced risk for aspiration following general aspiration precautions and choosing moistened, small-cut foods for ease of mastication.   However, pt does have challenging factors that could impact his oropharyngeal swallowing to include acute illness/hospitalization, fatigue/weakness, and ill-fitting Dentures. These factors can increase risk for aspiration, dysphagia as well as decreased oral intake overall.   During po trials, pt consumed all consistencies w/ no overt coughing, decline in vocal quality, or change in respiratory presentation during/post trials -- Thin liquids given only by CUP. Oral phase appeared grossly [facility] w/ timely bolus management, mastication, and control of bolus propulsion for A-P  transfer for swallowing w/ small bites/sips. Oral clearing achieved w/ all trial consistencies -- moistened, soft foods given.  OM Exam appeared [facility] w/ no unilateral weakness noted. Speech Clear. Pt fed self w/ setup support.   Recommend a more Mech Soft/Regular consistency diet w/ well-Cut meats, moistened foods; Thin liquids -- CUP use when drinking, NO straws. Recommend general aspiration precautions, reduce distractions during meals. Sitting in chair best for eating meals. Pills WHOLE vs need to Crush in Puree for safer, easier swallowing(d/t being Unsure of pt's Baseline Cognitive status).  Education given on Pills in Puree; food consistencies  and easy to eat options; general aspiration precautions to pt and NSG. NSG to reconsult if any new needs arise. NSG updated, agreed. MD updated. Recommend Dietician f/u for support. Noted Palliative Care present. SLP Visit Diagnosis: Dysphagia, unspecified (R13.10) (min loose Dentures; unsure of Cognitive attention/focus -- if any decline at Baseline??)    Aspiration Risk  Mild aspiration risk;Risk for inadequate nutrition/hydration (reduced following general precautions)    Diet Recommendation   a more Mech Soft/Regular consistency diet w/ well-Cut meats, moistened foods; Thin liquids -- CUP use when drinking, NO straws. Recommend general aspiration precautions, reduce distractions during meals. Sitting in chair best for eating meals. Tray setup at meals.  Medication Administration: Whole meds with puree (Crushed if necessary)    Other  Recommendations Recommended Consults:  (Dietician f/u) Oral Care Recommendations: Oral care BID;Oral care before and after PO;Staff/trained caregiver to provide oral care (Denture care) Other Recommendations:  (n/a)    Recommendations for follow up therapy are one component of a multi-disciplinary discharge planning process, led by the attending physician.  Recommendations may be updated based on patient status, additional functional criteria and insurance authorization.  Follow up Recommendations No SLP follow up      Assistance Recommended at Discharge Intermittent Supervision/Assistance (setup support at meals)  Functional Status Assessment Patient has had a recent decline in their functional status and demonstrates the ability to make significant improvements in function in a reasonable and predictable amount of time.  Frequency and Duration  (n/a)   (n/a)       Prognosis Prognosis for Safe Diet Advancement: Fair Barriers to Reach Goals: Time post onset;Severity of deficits (unsure of Cognitive Baseline?)      Swallow Study   General Date of  Onset: 06/23/22 HPI: Frank [name] is a 81 y.o. male with PMH significant for ESRD-HD-TTS, DM2, HTN, HLD, CAD, A-fib on Eliquis, CVA, PAD, chronic anemia, hypothyroidism, depression, BPH, gout who lives at [facility] assisted living.  10/13, patient presented to the ED for evaluation of epigastric, right upper quadrant pain, stated with nausea, vomiting.  In the ED, patient had temperature of 103, heart rate 98, blood pressure in 90s which later dropped down to 80s.  Initially on room air but later required up to 4 L of oxygen. CT abdomen showed cholelithiasis with gallbladder wall thickening and edema suggesting acute cholecystitis. Percutaneous cholecystostomy tube placement by IR on 06/29/2022. CXR 10/13: Bibasilar atelectasis. Repeat CXR 06/23/2022 Mild interval worsening in lung aeration. Specifically, opacity  in the right lung base has increased. This is suspected to be a  combination pleural fluid and atelectasis and/or pneumonia.  MRI in 2021: "Underlying moderately advanced age-related cerebral atrophy with  mild chronic small vessel ischemic disease.". Type of Study: Bedside Swallow Evaluation (repeat requested by NSG d/t coughing this AM) Previous Swallow Assessment: 10/2019; 06/24/2022(this admit) Diet Prior to this Study: Regular;Thin liquids Temperature Spikes Noted: No (  wbc 12.4) Respiratory Status: Room air History of Recent Intubation: No Behavior/Cognition: Alert;Cooperative;Pleasant mood;Confused;Distractible;Requires cueing (redirectible) Oral Cavity Assessment: Within Functional Limits Oral Care Completed by SLP: Yes Oral Cavity - Dentition: Dentures, top;Dentures, bottom (min loose and ill-fitting) Vision: Functional for self-feeding Self-Feeding Abilities: Able to feed self;Needs assist;Needs set up Patient Positioning: Upright in chair Baseline Vocal Quality: Normal (in quiet setting) Volitional Cough: Strong Volitional Swallow: Able to elicit    Oral/Motor/Sensory  Function Overall Oral Motor/Sensory Function: Within functional limits   Ice Chips Ice chips: Not tested   Thin Liquid Thin Liquid: Within functional limits (by CUP) Presentation: Self Fed;Cup (~4 ozs of gingerale)    Nectar Thick Nectar Thick Liquid: Not tested   Honey Thick Honey Thick Liquid: Not tested   Puree Puree: Within functional limits Presentation: Self Fed;Spoon (3 trials)   Solid     Solid: Within functional limits (softened solid) Presentation: Self Fed (2 cookies pieces) Other Comments: min extra attention to mastication -- min loose dentures        Orinda Kenner, MS, CCC-SLP Speech Language Pathologist Rehab Services; [place] [phone] (ascom) Carrah Eppolito 06/27/2022,4:03 PM

## 2022-06-27 NOTE — Progress Notes (Signed)
SAHITH NURSE  MRN: [id number]  DOB/AGE: [birth year] 80 y.o.  Primary Care Physician:Hagan, Edmonia Lynch, NP  Admit date: 07/06/2022  Chief Complaint:  Chief Complaint  Patient presents with   Abdominal Pain    S-Pt presented on  06/26/2022 with  Chief Complaint  Patient presents with   Abdominal Pain  .   Patient is a 81 year old Caucasian male with a past medical history of diabetes mellitus, end-stage renal disease on hemodialysis on Tuesday Thursday Saturday schedule, hypothyroidism, depression, CVA, atrial fibrillation-on chronic anticoagulation therapy, anemia of chronic disease who was brought to the ER from [facility] assisted living with chief complaint of abdominal pain.   Patient was seen today in the ICU Patient resting comfortably in the bed Patient offers no specific complaints   Medications   allopurinol  100 mg Oral Daily   ALPRAZolam  0.25 mg Oral Q supper   ALPRAZolam  0.25 mg Oral Q T,Th,Sa-HD   apixaban  2.5 mg Oral BID   atorvastatin  20 mg Oral Daily   Chlorhexidine Gluconate Cloth  6 each Topical Q0600   Chlorhexidine Gluconate Cloth  6 each Topical Q0600   cholecalciferol  2,000 Units Oral Daily   donepezil  5 mg Oral QHS   insulin aspart  0-6 Units Subcutaneous Q4H   levothyroxine  150 mcg Oral Daily   lidocaine-prilocaine  1 Application Topical See admin instructions   melatonin  10 mg Oral QHS   midodrine  10 mg Oral TID WC   midodrine  5 mg Oral 2 times per day on Tue Thu Sat   neomycin-bacitracin-polymyxin  1 Application Topical Once per day on Mon Thu   sevelamer carbonate  2,400 mg Oral TID WC   sodium chloride flush  5 mL Intracatheter Q8H   tiZANidine  2 mg Oral QHS   traZODone  50 mg Oral QHS   umeclidinium-vilanterol  1 puff Inhalation Daily         JFH:LKTGY from the symptoms mentioned above,there are no other symptoms referable to all systems reviewed.  Physical Exam: Vital signs in last 24 hours: Temp:  [97.4 F (36.3  C)-99.4 F (37.4 C)] 98.8 F (37.1 C) (10/18 0400) Pulse Rate:  [46-139] 75 (10/18 0600) Resp:  [19-38] 23 (10/18 0600) BP: (84-125)/(37-72) 106/66 (10/18 0600) SpO2:  [93 %-100 %] 99 % (10/18 0600) Weight:  [90.1 kg] 90.1 kg (10/17 1400) Weight change:  Last BM Date : 06/26/22  Intake/Output from previous day: 10/17 0701 - 10/18 0700 In: 620 [P.O.:420; IV Piggyback:200] Out: 1095 [Drains:95] No intake/output data recorded.   Physical Exam:  General- pt is awake,alert, oriented to time place and person, Chronically ill-appearing  Resp- No acute REsp distress, Decreased breath sounds at bases  CVS- S1S2 regular in rate and rhythm  GIT- BS+, soft, NT,JP drain in situ  EXT- NO LE Edema, Cyanosis  Access-  AVF positive bruit and thrill  Lab Results:  CBC  Recent Labs    06/25/22 0411  WBC 12.4*  HGB 10.5*  HCT 34.2*  PLT 156    BMET  Recent Labs    06/25/22 0411  NA 135  K 4.3  CL 95*  CO2 29  GLUCOSE 139*  BUN 39*  CREATININE 5.60*  CALCIUM 9.2      Most recent Creatinine trend  Lab Results  Component Value Date   CREATININE 5.60 (H) 06/25/2022   CREATININE 4.23 (H) 06/24/2022   CREATININE 5.18 (H) 06/23/2022  MICRO   Recent Results (from the past 240 hour(s))  Blood culture (routine x 2)     Status: None (Preliminary result)   Collection Time: 06/13/2022  3:48 AM   Specimen: BLOOD  Result Value Ref Range Status   Specimen Description BLOOD LEFT FOREARM  Final   Special Requests   Final    BOTTLES DRAWN AEROBIC AND ANAEROBIC Blood Culture results may not be optimal due to an excessive volume of blood received in culture bottles   Culture   Final    NO GROWTH 4 DAYS Performed at [facility], [address], [place], [place] [address]    Report Status PENDING  Incomplete  Blood culture (routine x 2)     Status: None (Preliminary result)   Collection Time: 07/01/2022  3:48 AM   Specimen: BLOOD  Result Value Ref Range  Status   Specimen Description BLOOD LEFT ASSIST CONTROL  Final   Special Requests   Final    BOTTLES DRAWN AEROBIC AND ANAEROBIC Blood Culture results may not be optimal due to an excessive volume of blood received in culture bottles   Culture   Final    NO GROWTH 4 DAYS Performed at [facility], [address], [place], [place] [address]    Report Status PENDING  Incomplete  SARS Coronavirus 2 by RT PCR (hospital order, performed in [place] hospital lab) *cepheid single result test* Anterior Nasal Swab     Status: None   Collection Time: 06/20/2022 12:07 PM   Specimen: Anterior Nasal Swab  Result Value Ref Range Status   SARS Coronavirus 2 by RT PCR NEGATIVE NEGATIVE Final    Comment: (NOTE) SARS-CoV-2 target nucleic acids are NOT DETECTED.  The SARS-CoV-2 RNA is generally detectable in upper and lower respiratory specimens during the acute phase of infection. The lowest concentration of SARS-CoV-2 viral copies this assay can detect is 250 copies / mL. A negative result does not preclude SARS-CoV-2 infection and should not be used as the sole basis for treatment or other patient management decisions.  A negative result may occur with improper specimen collection / handling, submission of specimen other than nasopharyngeal swab, presence of viral mutation(s) within the areas targeted by this assay, and inadequate number of viral copies (<250 copies / mL). A negative result must be combined with clinical observations, patient history, and epidemiological information.  Fact Sheet for Patients:   https://www.patel.info/  Fact Sheet for Healthcare Providers: https://hall.com/  This test is not yet approved or  cleared by the [place] FDA and has been authorized for detection and/or diagnosis of SARS-CoV-2 by FDA under an Emergency Use Authorization (EUA).  This EUA will remain in effect (meaning this test can be used) for  the duration of the COVID-19 declaration under Section 564(b)(1) of the Act, 21 U.S.C. section 360bbb-3(b)(1), unless the authorization is terminated or revoked sooner.  Performed at [facility], [address] [place],  [address]   MRSA Next Gen by PCR, Nasal     Status: None   Collection Time: 06/23/2022 12:58 PM   Specimen: Nasal Mucosa; Nasal Swab  Result Value Ref Range Status   MRSA by PCR Next Gen NOT DETECTED NOT DETECTED Final    Comment: (NOTE) The GeneXpert MRSA Assay (FDA approved for NASAL specimens only), is one component of a comprehensive MRSA colonization surveillance program. It is not intended to diagnose MRSA infection nor to guide or monitor treatment for MRSA infections. Test performance is  not FDA approved in patients less than 52 years old. Performed at [facility], [address], [place], [place] [address]   Aerobic/Anaerobic Culture w Gram Stain (surgical/deep wound)     Status: None   Collection Time: 06/21/2022  2:43 PM   Specimen: BILE  Result Value Ref Range Status   Specimen Description   Final    BILE Performed at [facility], [address], [place], [place] [address]    Special Requests   Final    NONE Performed at [facility], [place], [place] [address]    Gram Stain NO WBC SEEN FEW GRAM NEGATIVE RODS   Final   Culture   Final    FEW KLEBSIELLA OXYTOCA ABUNDANT ENTEROCOCCUS RAFFINOSUS MODERATE BACTEROIDES CACCAE BETA LACTAMASE POSITIVE Performed at [facility], [place] [address], [place], [place] [address]    Report Status 06/26/2022 FINAL  Final   Organism ID, Bacteria KLEBSIELLA OXYTOCA  Final   Organism ID, Bacteria ENTEROCOCCUS RAFFINOSUS  Final      Susceptibility   Klebsiella oxytoca - MIC*    AMPICILLIN RESISTANT Resistant     CEFAZOLIN <=4 SENSITIVE Sensitive     CEFEPIME <=0.12 SENSITIVE Sensitive     CEFTAZIDIME <=1 SENSITIVE Sensitive     CEFTRIAXONE  <=0.25 SENSITIVE Sensitive     CIPROFLOXACIN <=0.25 SENSITIVE Sensitive     GENTAMICIN <=1 SENSITIVE Sensitive     IMIPENEM <=0.25 SENSITIVE Sensitive     TRIMETH/SULFA <=20 SENSITIVE Sensitive     AMPICILLIN/SULBACTAM <=2 SENSITIVE Sensitive     PIP/TAZO <=4 SENSITIVE Sensitive     * FEW KLEBSIELLA OXYTOCA   Enterococcus raffinosus - MIC*    AMPICILLIN 16 RESISTANT Resistant     VANCOMYCIN <=0.5 SENSITIVE Sensitive     GENTAMICIN SYNERGY SENSITIVE Sensitive     * ABUNDANT ENTEROCOCCUS RAFFINOSUS         Impression:   1)Renal     End-stage renal disease Patient is on hemodialysis Patient is on Tuesday Thursday Saturday schedule Patient undergoes dialysis at [facility] under the care of Dr. Graylon Gunning We dialyzed  patient yesterday     2)Hypotension Patient is on midodrine .    3)Anemia of chronic disease     Latest Ref Rng & Units 06/25/2022    4:11 AM 06/24/2022    5:38 AM 06/23/2022    5:07 AM  CBC  WBC 4.0 - 10.5 K/uL 12.4  11.0  12.4   Hemoglobin 13.0 - 17.0 g/dL 10.5  10.3  10.3   Hematocrit 39.0 - 52.0 % 34.2  33.3  33.9   Platelets 150 - 400 K/uL 156  135  123        HGb at goal (9--11) We will keep patient on Epogen during dialysis  4) Secondary hyperparathyroidism -CKD Mineral-Bone Disorder    Lab Results  Component Value Date   PTH 184 (H) 01/23/2019   CALCIUM 9.2 06/25/2022   CAION 1.15 10/27/2021   PHOS 3.6 06/27/2022    Secondary Hyperparathyroidism present   Phosphorus at goal.   5)Acute cholecystitis Patient is being followed by primary team, surgery team Patient is to have percutaneous cholecystostomy tube   6) Electrolytes      Latest Ref Rng & Units 06/25/2022    4:11 AM 06/24/2022    5:38 AM 06/23/2022    5:07 AM  BMP  Glucose 70 - 99 mg/dL 139  102  159   BUN  8 - 23 mg/dL 39  28  37   Creatinine 0.61 - 1.24 mg/dL 5.60  4.23  5.18   Sodium 135 - 145 mmol/L 135  134  137   Potassium 3.5 -  5.1 mmol/L 4.3  4.0  3.9   Chloride 98 - 111 mmol/L 95  96  97   CO2 22 - 32 mmol/L 29  29  27    Calcium 8.9 - 10.3 mg/dL 9.2  8.5  7.8      Sodium Hyponatremia Patient sodium is low secondary to ESRD/inability to get rid of free water   Potassium Normokalemic    7)Acid base  Co2 at goal     Plan:    No need for HD today     Frank [name] 06/27/2022, 8:01 AM

## 2022-06-27 NOTE — Hospital Course (Addendum)
Taken from prior notes.  81 yo male who presented to [facility] ER on 10/13 with a 24 hr hx of RUQ abdominal pain, nausea, and vomiting.  He also endorsed subjective fevers and chills. Remains on pressors in ICU   ED Course  Upon arrival to the ER lab results revealed K+ 3.2/chloride 94/ glucose 163/creatinine 4.05/calcium 8.7/anion gap 19/troponin 49/lactic acid 2.5/pct 6.51/wbc 3.1/PT 17.3/INR 1.4.  CT Abd/Pelvis concerning for cholelithiasis with gallbladder wall thickening and edema suggesting acute cholecystitis.  Pt received zosyn.  General surgery consulted by ER provider and IR consulted per recommendations for cholecystostomy tube placement.  PT admitted to the stepdown unit per hospitalist team for additional workup and treatment.     Initial ER vital signs: temp 100 F, bp 95/49/hr 88/resp rate 18/O2 sats 97% on RA Initial EKG: Atrial fibrillation with PVC's and RBBB, hr 91 CT Abd Pelvis 10/13: Cholelithiasis with gallbladder wall thickening and edema suggesting acute cholecystitis. Small amount of free fluid in the pelvis. Increased density in the fluid may indicate hemorrhagic fluid or proteinaceous fluid. Source is not identified. Prostate gland enlargement. Bladder wall thickening likely due to outlet obstruction but could indicate cystitis. Small right pleural effusion with basilar atelectasis or consolidation.     10/13: Admitted with sepsis secondary to acute cholecystitis IR consulted for cholecystostomy tube placement.  PCCM team consulted due to soft bp's may require vasopressors    CXR 10/13: Bibasilar atelectasis   Despite 2L fluid bolus bp's remained soft PCCM team consulted for possible need of vasopressors.     06/23/22- patient seen and examined at bedside.  He remains critically ill on levophed with chronic aphasic speech post previous stroke.  He still has active return form GB drain but seems clinically improved. He reports ongoing RUQ pain and tenderness.   He is for HD  today.    06/24/22- patient is mildly improved, he is more awake and interactive.  He is asking if he can eat and surgery has cleared him to start PO.  He is for swallow evaluation today, hx of CVA.  Ongoing active bilious return from cholecystostomy drain. Still has RUQ pain/tenderness but improved.  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     10/16 remains on pressors   10/17 TRH pickup.  10/18: Blood pressure with low normal limit on midodrine. Currently on Unasyn for cholecystitis s/p cholecystostomy by IR.  Cultures growing Klebsiella oxytoca and Enterococcus raffenosus.  Blood cultures remain negative.  Patient is high risk for deterioration and mortality.  Palliative care was consulted.  Current recommended CODE STATUS is DNR.  [facility], daughter would like to hold and hoping that he can go back to his assisted living facility.  PT recommendations remained SNF.  07-23-23: Patient died around 2 AM.  No formal documentation, only noted by our cross coverage that she was called to pronounce dead around 2:25 AM.  Talked with daughter and she was told that patient had 2 bowel movements overnight and he went to bed without any problem.  Last blood glucose was checked at 11 PM and he was sleeping after that.  1 transient hypoxia recorded by tech but when nurse went to check on him the saturation was above 90% and he was sleeping.  Nurse went to check on him around 2 AM and found him not breathing with no pulses.  Night on-call nurse practitioner was called and she pronounced him dead at 2:25 AM.  Family  was notified.

## 2022-06-27 NOTE — Consult Note (Signed)
Consultation Note Date: 06/27/2022   Patient Name: Frank [name]  DOB: 12/04/1940  MRN: [id number]  Age / Sex: 81 y.o., male  PCP: Verl Blalock, NP Referring Physician: Lorella Nimrod, MD  Reason for Consultation: Establishing goals of care  HPI/Patient Profile: 81 yo male who presented to [facility] ER on 10/13 with a 24 hr hx of RUQ abdominal pain, nausea, and vomiting.  He also endorsed subjective fevers and chills. CT Abd/Pelvis concerning for cholelithiasis with gallbladder wall thickening and edema suggesting acute cholecystitis.  Pt received zosyn.  General surgery consulted by ER provider and IR consulted per recommendations for cholecystostomy tube placement.   Clinical Assessment and Goals of Care: Notes and labs reviewed. Patient is resting in bed. No family at bedside. He states he lives in ALF since his stroke. He states he is divorced with 4 children.    We discussed his diagnosis, prognosis, GOC, EOL wishes disposition and options.  Created space and opportunity for patient  to explore thoughts and feelings regarding current medical information.   A detailed discussion was had today regarding advanced directives.  Concepts specific to code status, artifical feeding and hydration, IV antibiotics and rehospitalization were discussed.  The difference between an aggressive medical intervention path and a comfort care path was discussed.  Values and goals of care important to patient and family were attempted to be elicited.  Discussed limitations of medical interventions to prolong quality of life in some situations and discussed the concept of human mortality.  He confirms DNR/DNI. Continue dialysis. Continue to treat the treatable.   SUMMARY OF RECOMMENDATIONS   Continue to treat the treatable. DNR/DNI.        Primary Diagnoses: Present on Admission:  Sepsis ([place])  Hypothyroidism   Diabetes mellitus with ESRD (end-stage renal disease) ([place])  Coronary artery disease involving native coronary artery of native heart without angina pectoris  Elevated troponin  Atrial fibrillation, chronic ([place])   I have reviewed the medical record, interviewed the patient and family, and examined the patient. The following aspects are pertinent.  Past Medical History:  Diagnosis Date   Anemia of chronic kidney failure    Arthritis    Benign prostate hyperplasia    Bradycardia    Cataracts, bilateral 05/15/2012   Chronic idiopathic gout of multiple sites 02/10/2015   Chronic kidney disease    [facility]- ESRD   Depression    Diabetes mellitus without complication ([place])    diet controlled    Diabetic nephropathy ([employer])    ED (erectile dysfunction) 08/28/2012   Elevated liver enzymes 06/10/2019   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 HTN PLAN   BP goal is <140/90 BP Readings from Last 3 Encounters:  08/27/16 140/79  08/13/16 141/80  08/06/16 144/86   Current Anti-Hyptensive Medications: None  Today's Recommendations: Continue lifestyle modifications. Defer  starting medication to PCP.  Recommend continued regular exercise as tolerated. Recomm   History of UTI    s/p foley in hospital 01/2019    Hyperlipidemia    Hypothyroidism    Illiterate 11/19/2013   Last Assessment & Plan:  Assisted patient with Handicapped Placard application today.     Insomnia 05/08/2019   Neuropathy    Peripheral neuropathy    Peripheral vascular disease (HCC)    Second degree heart block    Secondary hyperparathyroidism ([place])    Renal   Stroke ([place]) 2021   Thyroid disease    Social History   Socioeconomic History   Marital status: Divorced    Spouse name: Not on file   Number of children: 4   Years of education: Not on file    Highest education level: 8th grade  Occupational History   Not on file  Tobacco Use   Smoking status: Former    Years: 30.00    Types: Cigarettes    Quit date: 1994    Years since quitting: 29.8   Smokeless tobacco: Never  Vaping Use   Vaping Use: Never used  Substance and Sexual Activity   Alcohol use: No   Drug use: No   Sexual activity: Not Currently  Other Topics Concern   Not on file  Social History Narrative   4 kids (3 daughters and 1 son)   Daughter [name] DPR      Lives at [facility]    Social Determinants of Health   Financial Resource Strain: Low Risk  (07/06/2019)   Overall Financial Resource Strain (CARDIA)    Difficulty of Paying Living Expenses: Not hard at all  Food Insecurity: No Food Insecurity (07/06/2019)   Hunger Vital Sign    Worried About Running Out of Food in the Last Year: Never true    Ran Out of Food in the Last Year: Never true  Transportation Needs: No Transportation Needs (07/06/2019)   PRAPARE - Hydrologist (Medical): No    Lack of Transportation (Non-Medical): No  Physical Activity: Not on file  Stress: No Stress Concern Present (07/06/2019)   [place]    Feeling of Stress : Only a little  Social Connections: Unknown (07/06/2019)   Social Connection and Isolation Panel [NHANES]    Frequency of Communication with Friends and Family: More than three times a week    Frequency of Social Gatherings with Friends and Family: Not on file    Attends Religious Services: Not on file    Active Member of Clubs or Organizations: Not on file    Attends Archivist Meetings: Not on file    Marital Status: Divorced   Family History  Problem Relation Age of Onset   Diabetes Mother    Kidney disease Mother    Liver disease Mother    Heart Problems Mother    Stroke Neg Hx    Scheduled Meds:  allopurinol  100 mg Oral  Daily   ALPRAZolam  0.25 mg Oral Q supper   ALPRAZolam  0.25 mg Oral Q T,Th,Sa-HD   apixaban  2.5 mg Oral BID   atorvastatin  20 mg Oral Daily   Chlorhexidine Gluconate Cloth  6 each Topical Q0600   Chlorhexidine Gluconate Cloth  6 each Topical Q0600   cholecalciferol  2,000 Units Oral Daily   donepezil  5 mg Oral QHS   insulin aspart  0-6  Units Subcutaneous Q4H   levothyroxine  150 mcg Oral Daily   lidocaine-prilocaine  1 Application Topical See admin instructions   melatonin  10 mg Oral QHS   midodrine  10 mg Oral TID WC   midodrine  5 mg Oral 2 times per day on Tue Thu Sat   neomycin-bacitracin-polymyxin  1 Application Topical Once per day on Mon Thu   sevelamer carbonate  2,400 mg Oral TID WC   sodium chloride flush  5 mL Intracatheter Q8H   tiZANidine  2 mg Oral QHS   traZODone  50 mg Oral QHS   umeclidinium-vilanterol  1 puff Inhalation Daily   Continuous Infusions:  sodium chloride     ampicillin-sulbactam (UNASYN) IV 3 g (06/27/22 1027)   anticoagulant sodium citrate     lactated ringers Stopped (06/25/22 1625)   vancomycin 1,000 mg (06/26/22 1236)   PRN Meds:.acetaminophen, albuterol, alteplase, anticoagulant sodium citrate, heparin, HYDROcodone-acetaminophen, lidocaine (PF), lidocaine-prilocaine, ondansetron **OR** ondansetron (ZOFRAN) IV, mouth rinse, pentafluoroprop-tetrafluoroeth, senna, zinc oxide Medications Prior to Admission:  Prior to Admission medications   Medication Sig Start Date End Date Taking? Authorizing Provider  ALPRAZolam Duanne Moron) 0.25 MG tablet Take 0.25 mg by mouth daily with supper. 01/11/20  Yes [provider]  apixaban (ELIQUIS) 2.5 MG TABS tablet TAKE ONE TABLET BY MOUTH TWICE A DAY. **THIS MEDICINE THINS THE BLOOD** 05/02/22  Yes Shirley Friar, PA-C  Cholecalciferol (VITAMIN D) 50 MCG (2000 UT) tablet Take 2,000 Units by mouth daily. 01/24/21  Yes [provider]  donepezil (ARICEPT) 5 MG tablet Take 1 tablet (5 mg total)  by mouth at bedtime. 12/23/19  Yes Melvenia Beam, MD  lidocaine-prilocaine (EMLA) cream Apply 1 application topically See admin instructions. Apply to access site 1 to 2 hours before dialysis. Cover with occlusive dressing.On Tuesday,Thursday and Saturday 04/08/19  Yes [provider]  Melatonin 10 MG TABS Take 10 mg by mouth at bedtime.   Yes [provider]  midodrine (PROAMATINE) 10 MG tablet Take 1 tablet (10 mg total) by mouth 3 (three) times daily with meals. Patient taking differently: Take 10 mg by mouth 3 (three) times daily with meals. Use as directed 12/14/19  Yes Tillery, Satira Mccallum, PA-C  midodrine (PROAMATINE) 5 MG tablet 5 mg. Take 5 mg by mouth at 10:30 and 1 pm on Dialysis days   Yes [provider]  neomycin-bacitracin-polymyxin (NEOSPORIN) [phone] ointment Apply 1 Application topically 2 (two) times a week.   Yes [provider]  pantoprazole (PROTONIX) 40 MG tablet Take 1 tablet (40 mg total) by mouth daily. Patient taking differently: Take 40 mg by mouth daily before breakfast. 10/23/19 06/21/2022 Yes Regalado, Belkys A, MD  sevelamer carbonate (RENVELA) 800 MG tablet Take 3 tablets (2,400 mg total) by mouth 3 (three) times daily with meals. Patient taking differently: Take 2,400 mg by mouth 3 (three) times daily with meals. Use as directed 10/23/19  Yes Regalado, Belkys A, MD  Skin Protectants, Misc. (MINERIN CREME EX) Apply 1 application topically daily. Apply to both arms and legs   Yes [provider]  tamsulosin (FLOMAX) 0.4 MG CAPS capsule Take 1 capsule (0.4 mg total) by mouth daily. Patient taking differently: Take 0.8 mg by mouth daily. 10/23/19  Yes Regalado, Belkys A, MD  tiZANidine (ZANAFLEX) 2 MG tablet Take 2 mg by mouth at bedtime. 03/27/21  Yes [provider]  traZODone (DESYREL) 50 MG tablet Take 50 mg by mouth at bedtime.   Yes [provider]  zinc oxide (BALMEX) 11.3 % CREA cream Apply 1  application topically See admin instructions. Apply to bottom and groin area 3 times daily and as needed for incontinence care   Yes [provider]  acetaminophen (TYLENOL) 325 MG tablet Take 650 mg by mouth every 6 (six) hours as needed for moderate pain.    [provider]  albuterol (VENTOLIN HFA) 108 (90 Base) MCG/ACT inhaler Inhale 2 puffs into the lungs every 6 (six) hours as needed for shortness of breath. 12/20/20   [provider]  allopurinol (ZYLOPRIM) 100 MG tablet Take 1 tablet (100 mg total) by mouth daily. 06/03/19   McLean-Scocuzza, Nino Glow, MD  ALPRAZolam Duanne Moron) 0.5 MG tablet Take 0.25 mg by mouth Every Tuesday,Thursday,and Saturday with dialysis. 09/21/21   [provider]  aspirin 81 MG chewable tablet Chew 81 mg by mouth daily.    [provider]  atorvastatin (LIPITOR) 20 MG tablet Take 20 mg by mouth daily.    [provider]  benzonatate (TESSALON) 200 MG capsule Take 200 mg by mouth 3 (three) times daily as needed for cough. Patient not taking: Reported on 06/19/2022    [provider]  bisacodyl (DULCOLAX) 10 MG suppository Place 10 mg rectally as needed for moderate constipation. Patient not taking: Reported on 06/13/2022    [provider]  Camphor-Menthol (FREEZE IT EX) Apply 1 application topically every 6 (six) hours as needed (back and neck pain).    [provider]  docusate sodium (COLACE) 100 MG capsule Take 200 mg by mouth daily as needed for moderate constipation. Patient not taking: Reported on 07/04/2022    [provider]  guaiFENesin (MUCINEX) 600 MG 12 hr tablet Take 600 mg by mouth every 12 (twelve) hours as needed (congestion).    [provider]  levothyroxine (SYNTHROID) 150 MCG tablet Take 150 mcg by mouth daily. 05/21/22   [provider]  ondansetron (ZOFRAN) 4 MG tablet Take 1 tablet (4 mg total) by mouth every 8 (eight) hours as needed. 11/06/20    Nance Pear, MD  senna (SENOKOT) 8.6 MG TABS tablet Take 2 tablets by mouth at bedtime as needed for moderate constipation.    [provider]  umeclidinium-vilanterol (ANORO ELLIPTA) 62.5-25 MCG/ACT AEPB Inhale 1 puff into the lungs daily.    [provider]   Allergies  Allergen Reactions   Ace Inhibitors Other (See Comments)    Hyperkalemia on ACE INHIBITORS Cr>1.9   Angiotensin Receptor Blockers Other (See Comments)    Hyperkalemia Elevates Cr > 1.9    Ivp Dye [Iodinated Contrast Media] Other (See Comments)    Cannot have due to renal failure   Adhesive [Tape] Other (See Comments)    UNDESCRIBED REACTION Can tolerate ONLY Coban wrap or mild paper tape!!   Review of Systems  All other systems reviewed and are negative.   Physical Exam Pulmonary:     Effort: Pulmonary effort is normal.  Abdominal:     Comments: Drain in place.   Neurological:     Mental Status: He is alert.     Vital Signs: BP (!) 99/46   Pulse 84   Temp (!) 97.3 F (36.3 C) (Axillary)   Resp 19   Ht 5\' 8"  (1.727 m)   Wt 90.1 kg   SpO2 95%   BMI 30.20 kg/m  Pain Scale: 0-10   Pain Score: 0-No pain   SpO2: SpO2: 95 % O2 Device:SpO2: 95 % O2 Flow  Rate: .O2 Flow Rate (L/min): 2 L/min  IO: Intake/output summary:  Intake/Output Summary (Last 24 hours) at 06/27/2022 1551 Last data filed at 06/27/2022 0500 Gross per 24 hour  Intake 410 ml  Output 95 ml  Net 315 ml    LBM: Last BM Date : 06/26/22 Baseline Weight: Weight: 85.7 kg Most recent weight: Weight: 90.1 kg       Signed by: Asencion Gowda, NP   Please contact Palliative Medicine Team phone at [phone] for questions and concerns.  For individual provider: See Shea Evans

## 2022-06-27 NOTE — Assessment & Plan Note (Signed)
Tuesday, Thursday and Saturday dialysis. -Continue with scheduled dialysis -Neurology on board

## 2022-06-27 NOTE — Progress Notes (Signed)
Progress Note   Patient: Frank [name] [id number] DOB: October 05, 1940 DOA: 07/08/2022     5 DOS: the patient was seen and examined on 06/27/2022   Brief hospital course: Taken from prior notes.  81 yo male who presented to [facility] ER on 10/13 with a 24 hr hx of RUQ abdominal pain, nausea, and vomiting.  He also endorsed subjective fevers and chills. Remains on pressors in ICU   ED Course  Upon arrival to the ER lab results revealed K+ 3.2/chloride 94/ glucose 163/creatinine 4.05/calcium 8.7/anion gap 19/troponin 49/lactic acid 2.5/pct 6.51/wbc 3.1/PT 17.3/INR 1.4.  CT Abd/Pelvis concerning for cholelithiasis with gallbladder wall thickening and edema suggesting acute cholecystitis.  Pt received zosyn.  General surgery consulted by ER provider and IR consulted per recommendations for cholecystostomy tube placement.  PT admitted to the stepdown unit per hospitalist team for additional workup and treatment.     Initial ER vital signs: temp 100 F, bp 95/49/hr 88/resp rate 18/O2 sats 97% on RA Initial EKG: Atrial fibrillation with PVC's and RBBB, hr 91 CT Abd Pelvis 10/13: Cholelithiasis with gallbladder wall thickening and edema suggesting acute cholecystitis. Small amount of free fluid in the pelvis. Increased density in the fluid may indicate hemorrhagic fluid or proteinaceous fluid. Source is not identified. Prostate gland enlargement. Bladder wall thickening likely due to outlet obstruction but could indicate cystitis. Small right pleural effusion with basilar atelectasis or consolidation.     . 10/13: Admitted with sepsis secondary to acute cholecystitis IR consulted for cholecystostomy tube placement.  PCCM team consulted due to soft bp's may require vasopressors    CXR 10/13: Bibasilar atelectasis   Despite 2L fluid bolus bp's remained soft PCCM team consulted for possible need of vasopressors.     06/23/22- patient seen and examined at bedside.  He remains critically ill on levophed with  chronic aphasic speech post previous stroke.  He still has active return form GB drain but seems clinically improved. He reports ongoing RUQ pain and tenderness.   He is for HD today.    06/24/22- patient is mildly improved, he is more awake and interactive.  He is asking if he can eat and surgery has cleared him to start PO.  He is for swallow evaluation today, hx of CVA.  Ongoing active bilious return from cholecystostomy drain. Still has RUQ pain/tenderness but improved.  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     10/16 remains on pressors   10/17 TRH pickup.  10/18: Blood pressure with low normal limit on midodrine. Currently on Unasyn for cholecystitis s/p cholecystostomy by IR.  Cultures growing Klebsiella oxytoca and Enterococcus raffenosus.  Blood cultures remain negative.  Patient is high risk for deterioration and mortality.  Palliative care was consulted.  Current recommended CODE STATUS is DNR.  [facility], daughter would like to hold and hoping that he can go back to his assisted living facility.  PT recommendations remained SNF.   Assessment and Plan: * Sepsis [facility]) Patient admitted with severe sepsis and septic shock initially requiring pressors. IV pressors were discontinued but patient is currently on p.o. midodrine. Secondary to acute cholecystitis s/p cholecystostomy tube in place.  Cultures growing Klebsiella oxytoca and Enterococcus raffenosus.  Blood cultures remain negative. Patient initially received Zosyn followed by Unasyn. -Continue with Unasyn. -Continue with midodrine.  Cholecystitis - See above  Diabetes mellitus with ESRD (end-stage renal disease) (HCC) CBG within goal -Continue with SSI  Coronary artery disease involving native  coronary artery of native heart without angina pectoris -Continue atorvastatin and Eliquis  Elevated troponin Noted to have mildly elevated troponin with a flat curve, most likely secondary to  demand ischemia with sepsis.  No chest pain  Atrial fibrillation, chronic (HCC) History of chronic A-fib on anticoagulation with apixaban as secondary prophylaxis for an acute stroke. -Continue Eliquis  Hypothyroidism Stable Continue Synthroid  ESRD (end stage renal disease) ([place]) Tuesday, Thursday and Saturday dialysis. -Continue with scheduled dialysis -Neurology on board      Subjective: Patient was seen and examined today.  Oriented to self only and appears lethargic.  Denies any pain.  P.o. intake okay per nursing staff.  Physical Exam: Vitals:   06/27/22 1200 06/27/22 1300 06/27/22 1400 06/27/22 1500  BP: (!) 112/53 124/69 (!) 110/55 (!) 99/46  Pulse: 84     Resp: 18 (!) 23 (!) 25 19  Temp:      TempSrc:      SpO2: 95%     Weight:      Height:       General.  Chronically ill-appearing elderly man, in no acute distress. Pulmonary.  Lungs clear bilaterally, normal respiratory effort. CV.  Regular rate and rhythm, no JVD, rub or murmur. Abdomen.  Soft, nontender, nondistended, BS positive. CNS.  Alert and oriented to self only.  No focal neurologic deficit. Extremities.  No edema, no cyanosis, pulses intact and symmetrical. Psychiatry.  Judgment and insight appears impaired  Data Reviewed: Prior data reviewed  Family Communication: Discussed with daughter on phone.  Disposition: Status is: Inpatient Remains inpatient appropriate because: Severity of illness.   Planned Discharge Destination: Skilled nursing facility and LTAC  DVT prophylaxis. Eliquis Time spent: 50 minutes  This record has been created using Systems analyst. Errors have been sought and corrected,but may not always be located. Such creation errors do not reflect on the standard of care.  Author: Lorella Nimrod, MD 06/27/2022 4:38 PM  For on call review [email or web address].

## 2022-06-27 NOTE — TOC Progression Note (Signed)
Transition of Care [facility]) - Progression Note    Patient Details  Name: Frank [name] MRN: [id number] Date of Birth: August 26, 1941  Transition of Care [facility]) CM/SW [place], [place] Phone Number: 06/27/2022, 11:36 AM  Clinical Narrative:     CSW spoke with patient's daughter [name] to inform that per Delsa Sale with [facility] has reviewed chart and they are able to accept patient if family agreeable. [name] reports she would like to hold off and see how patient does with PT today, with goal of returning to [facility]. Not agreeable to SNF at this time.   TOC has relayed to treatment team.        Expected Discharge Plan and Services                                                 Social Determinants of Health (SDOH) Interventions    Readmission Risk Interventions     No data to display

## 2022-06-27 NOTE — Progress Notes (Signed)
Patient's daughter,[name] is at the bedside. She request the attending MD and PT call her tomorrow 07-13-2022 with an update. She has specific questions about the antibiotic regimen infectious disease is recommending, as well as how patient did with physical therapy.

## 2022-07-11 NOTE — Progress Notes (Addendum)
Family came to bedside and able to answer questions and see patient. Personal items gathered and taken with family. One shoe and a pair of socks was left behind and [name] (daughter) notified. RN was told to just throw away at this time. Post mortem care completed and flow sheet done. Awaiting transport to morgue.

## 2022-07-11 NOTE — Significant Event (Signed)
          CROSS COVER NOTE  NAME: Frank [name] MRN: [id number] DOB : May 18, 1941 ATTENDING PHYSICIAN: Lorella Nimrod, MD   I was called to patient's bedside to pronounce that [name] has died, he was DNR/DNI at time of death. No spontaneous movements were present. There was no response to verbal or tactile stimuli. Pupils were mid-dilated and fixed. No breath sounds were appreciated over either lung field. Carotid and femoral pulses were absent bilaterally. No heart sounds were auscultated over the entire precordium. [name] was pronounced deceased at [facility] 07-19-22. Daughter Adelfa Koh notified at [phone]. Chaplain and postmortem services offered.    This document was prepared using Dragon voice recognition software and may include unintentional dictation errors.  Neomia Glass DNP, MBA, FNP-BC Nurse Practitioner [facility] Pager [phone]

## 2022-07-11 NOTE — Progress Notes (Addendum)
RN entered patients room at 0200 to check on patient and he was found to have no pulse or respirations. Charge nurse and Neomia Glass NP notified. Valetta Fuller came to bedside and pronounced patient. Daughter notified by NP.

## 2022-07-11 NOTE — Death Summary Note (Signed)
DEATH SUMMARY   Patient Details  Name: Frank [name] MRN: [id number] DOB: [birth year] TLX:BWIOM, Edmonia Lynch, NP Admission/Discharge Information   Admit Date:  Jul 01, 2022  Date of Death: Date of Death: 07-Jul-2022  Time of Death: Time of Death: 0225  Length of Stay: 6   Principle Cause of death: Cardiopulmonary arrest during sleep.  Hospital Diagnoses: Principal Problem:   Sepsis ([place]) Active Problems:   Cholecystitis   Diabetes mellitus with ESRD (end-stage renal disease) ([place])   Coronary artery disease involving native coronary artery of native heart without angina pectoris   Elevated troponin   Atrial fibrillation, chronic (HCC)   Hypothyroidism   ESRD (end stage renal disease) ([place])   Pressure injury of skin   Hospital Course: Taken from prior notes.  81 yo male who presented to [facility] ER on Jul 02, 2023 with a 24 hr hx of RUQ abdominal pain, nausea, and vomiting.  He also endorsed subjective fevers and chills. Remains on pressors in ICU   ED Course  Upon arrival to the ER lab results revealed K+ 3.2/chloride 94/ glucose 163/creatinine 4.05/calcium 8.7/anion gap 19/troponin 49/lactic acid 2.5/pct 6.51/wbc 3.1/PT 17.3/INR 1.4.  CT Abd/Pelvis concerning for cholelithiasis with gallbladder wall thickening and edema suggesting acute cholecystitis.  Pt received zosyn.  General surgery consulted by ER provider and IR consulted per recommendations for cholecystostomy tube placement.  PT admitted to the stepdown unit per hospitalist team for additional workup and treatment.     Initial ER vital signs: temp 100 F, bp 95/49/hr 88/resp rate 18/O2 sats 97% on RA Initial EKG: Atrial fibrillation with PVC's and RBBB, hr 91 CT Abd Pelvis Jul 02, 2023: Cholelithiasis with gallbladder wall thickening and edema suggesting acute cholecystitis. Small amount of free fluid in the pelvis. Increased density in the fluid may indicate hemorrhagic fluid or proteinaceous fluid. Source is not identified. Prostate  gland enlargement. Bladder wall thickening likely due to outlet obstruction but could indicate cystitis. Small right pleural effusion with basilar atelectasis or consolidation.     July 02, 2023: Admitted with sepsis secondary to acute cholecystitis IR consulted for cholecystostomy tube placement.  PCCM team consulted due to soft bp's may require vasopressors    CXR 2023-07-02: Bibasilar atelectasis   Despite 2L fluid bolus bp's remained soft PCCM team consulted for possible need of vasopressors.     06/23/22- patient seen and examined at bedside.  He remains critically ill on levophed with chronic aphasic speech post previous stroke.  He still has active return form GB drain but seems clinically improved. He reports ongoing RUQ pain and tenderness.   He is for HD today.    06/24/22- patient is mildly improved, he is more awake and interactive.  He is asking if he can eat and surgery has cleared him to start PO.  He is for swallow evaluation today, hx of CVA.  Ongoing active bilious return from cholecystostomy drain. Still has RUQ pain/tenderness but improved.  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     10/16 remains on pressors   10/17 TRH pickup.  10/18: Blood pressure with low normal limit on midodrine. Currently on Unasyn for cholecystitis s/p cholecystostomy by IR.  Cultures growing Klebsiella oxytoca and Enterococcus raffenosus.  Blood cultures remain negative.  Patient is high risk for deterioration and mortality.  Palliative care was consulted.  Current recommended CODE STATUS is DNR.  [facility], daughter would like to hold and hoping that he can go back to his assisted living facility.  PT recommendations remained SNF.  10-Jul-2023: Patient died around 2 AM.  No formal documentation, only noted by our cross coverage that she was called to pronounce dead around 2:25 AM.  Talked with daughter and she was told that patient had 2 bowel movements overnight and he went to bed  without any problem.  Last blood glucose was checked at 11 PM and he was sleeping after that.  1 transient hypoxia recorded by tech but when nurse went to check on him the saturation was above 90% and he was sleeping.  Nurse went to check on him around 2 AM and found him not breathing with no pulses.  Night on-call nurse practitioner was called and she pronounced him dead at 2:25 AM.  Family was notified.   Assessment and Plan: * Sepsis [facility]) Patient admitted with severe sepsis and septic shock initially requiring pressors. IV pressors were discontinued but patient is currently on p.o. midodrine. Secondary to acute cholecystitis s/p cholecystostomy tube in place.  Cultures growing Klebsiella oxytoca and Enterococcus raffenosus.  Blood cultures remain negative. Patient initially received Zosyn followed by Unasyn. -Continue with Unasyn. -Continue with midodrine.  Cholecystitis - See above  Diabetes mellitus with ESRD (end-stage renal disease) (HCC) CBG within goal -Continue with SSI  Coronary artery disease involving native coronary artery of native heart without angina pectoris -Continue atorvastatin and Eliquis  Elevated troponin Noted to have mildly elevated troponin with a flat curve, most likely secondary to demand ischemia with sepsis.  No chest pain  Atrial fibrillation, chronic (HCC) History of chronic A-fib on anticoagulation with apixaban as secondary prophylaxis for an acute stroke. -Continue Eliquis  Hypothyroidism Stable Continue Synthroid  ESRD (end stage renal disease) ([place]) Tuesday, Thursday and Saturday dialysis. -Continue with scheduled dialysis -Neurology on board         Procedures: Cholecystostomy drain placement  Consultations: IR, PCCM, nephrology  The results of significant diagnostics from this hospitalization (including imaging, microbiology, ancillary and laboratory) are listed below for reference.   Significant Diagnostic Studies: DG Chest  1 View  Result Date: 06/23/2022 CLINICAL DATA:  Sepsis.  Aspiration. EXAM: CHEST  1 VIEW COMPARISON:  06/30/2022 and older exams. FINDINGS: Right lung base opacity is increased from the previous exam, now completely obscuring the right hemidiaphragm. This is suspected to be a combination of pleural fluid and atelectasis/infiltrate. Milder opacity at the left lung base is similar to the prior study consistent with atelectasis. Remainder of the lungs is clear. Heart normal in size. No mediastinal or hilar masses. No pneumothorax. IMPRESSION: 1. Mild interval worsening in lung aeration. Specifically, opacity in the right lung base has increased. This is suspected to be a combination pleural fluid and atelectasis and/or pneumonia. Electronically Signed   By: Lajean Manes M.D.   On: 06/23/2022 18:04   IR Perc Cholecystostomy  Result Date: 06/19/2022 CLINICAL DATA:  Cholelithiasis, gallbladder wall thickening and edema suggesting acute cholecystitis, sepsis. General surgery requested consultation for cholecystostomy catheter placement. EXAM: PERCUTANEOUS CHOLECYSTOSTOMY TUBE PLACEMENT WITH ULTRASOUND AND FLUOROSCOPIC GUIDANCE FLUOROSCOPY: Radiation Exposure Index (as provided by the fluoroscopic device): 6.6 mGy air Kerma TECHNIQUE: The procedure, risks (including but not limited to bleeding, infection, organ damage ), benefits, and alternatives were explained to the patient. Questions regarding the procedure were encouraged and answered. The patient understands and consents to the procedure. Survey ultrasound of the abdomen was performed and an appropriate skin entry site was identified. Skin site was marked, prepped with Betadine, and draped in usual sterile fashion, and  infiltrated locally with 1% lidocaine. Intravenous Fentanyl 37mcg and Versed 0.5mg  were administered as conscious sedation during continuous monitoring of the patient's level of consciousness and physiological / cardiorespiratory status by the  radiology RN, with a total moderate sedation time of 10 minutes. Under real-time ultrasound guidance, gallbladder was accessed using a transhepatic approach with a 21-gauge needle. Ultrasound image documentation was saved. Bile returned through the hub. Needle was exchanged over a 018 guidewire for transitional dilator which allowed placement of 035 wire. Over this, a 10.2 French pigtail catheter was advanced and formed centrally in the gallbladder lumen. Small contrast injection confirmed appropriate position. Catheter secured externally with 0 Prolene suture and placed external drain bag. Patient tolerated the procedure well. COMPLICATIONS: COMPLICATIONS none IMPRESSION: 1. Technically successful percutaneous cholecystostomy tube placement with ultrasound and fluoroscopic guidance. Electronically Signed   By: Lucrezia Europe M.D.   On: 07/10/2022 15:07   CT ABDOMEN PELVIS WO CONTRAST  Result Date: 06/26/2022 CLINICAL DATA:  Acute nonlocalized abdominal pain. Abdominal pain for 1 day with nausea and vomiting. EXAM: CT ABDOMEN AND PELVIS WITHOUT CONTRAST TECHNIQUE: Multidetector CT imaging of the abdomen and pelvis was performed following the standard protocol without IV contrast. RADIATION DOSE REDUCTION: This exam was performed according to the departmental dose-optimization program which includes automated exposure control, adjustment of the mA and/or kV according to patient size and/or use of iterative reconstruction technique. COMPARISON:  01/10/2021 FINDINGS: Lower chest: Small right pleural effusion with atelectasis or consolidation in the right lung base. Hepatobiliary: No focal liver lesions. Cholelithiasis with stone in the gallbladder. Gallbladder wall thickening and pericholecystic edema. Changes may represent acute cholecystitis in the appropriate clinical setting. No bile duct dilatation. Pancreas: Unremarkable. No pancreatic ductal dilatation or surrounding inflammatory changes. Spleen: Normal in size  without focal abnormality. Adrenals/Urinary Tract: No adrenal gland nodules. Kidneys are atrophic. No hydronephrosis or hydroureter. No renal or ureteral stones. Bladder wall is thickened possibly indicating cystitis or outlet obstruction. Stomach/Bowel: Stomach, small bowel, and colon are mostly decompressed. Scattered stool in the colon. No wall thickening or inflammatory changes. Appendix is normal. Vascular/Lymphatic: Aortic atherosclerosis. No enlarged abdominal or pelvic lymph nodes. Reproductive: Prostate gland is enlarged, measuring 5.1 cm diameter. Other: Small amount of free fluid in the pelvis. Free fluid has mildly increased attenuation which may indicate hemorrhagic or proteinaceous fluid. No source is identified. No free air to suggest perforation. Musculoskeletal: Degenerative changes in the spine. No destructive bone lesions. IMPRESSION: 1. Cholelithiasis with gallbladder wall thickening and edema suggesting acute cholecystitis. 2. Small amount of free fluid in the pelvis. Increased density in the fluid may indicate hemorrhagic fluid or proteinaceous fluid. Source is not identified. 3. Prostate gland enlargement. Bladder wall thickening likely due to outlet obstruction but could indicate cystitis. 4. Small right pleural effusion with basilar atelectasis or consolidation. These results were called by telephone at the time of interpretation on 06/12/2022 at 3:10 am to provider [facility] , who verbally acknowledged these results. Electronically Signed   By: Lucienne Capers M.D.   On: 06/12/2022 03:17   DG Chest 2 View  Result Date: 06/12/2022 CLINICAL DATA:  Chest pain EXAM: CHEST - 2 VIEW COMPARISON:  10/21/2019 FINDINGS: Bibasilar atelectasis.  No pleural effusion or pneumothorax. The heart is top-normal in size. Degenerative changes of the visualized thoracolumbar spine. IMPRESSION: Bibasilar atelectasis. Electronically Signed   By: Julian Hy M.D.   On: 07/08/2022 01:32     Microbiology: Recent Results (from the past 240 hour(s))  Blood culture (  routine x 2)     Status: None   Collection Time: 06/11/2022  3:48 AM   Specimen: BLOOD  Result Value Ref Range Status   Specimen Description BLOOD LEFT FOREARM  Final   Special Requests   Final    BOTTLES DRAWN AEROBIC AND ANAEROBIC Blood Culture results may not be optimal due to an excessive volume of blood received in culture bottles   Culture   Final    NO GROWTH 5 DAYS Performed at [facility], [address] [place], [place] [address]    Report Status 06/27/2022 FINAL  Final  Blood culture (routine x 2)     Status: None   Collection Time: 06/27/2022  3:48 AM   Specimen: BLOOD  Result Value Ref Range Status   Specimen Description BLOOD LEFT ASSIST CONTROL  Final   Special Requests   Final    BOTTLES DRAWN AEROBIC AND ANAEROBIC Blood Culture results may not be optimal due to an excessive volume of blood received in culture bottles   Culture   Final    NO GROWTH 5 DAYS Performed at [facility], [address] [place], [place] [address]    Report Status 06/27/2022 FINAL  Final  SARS Coronavirus 2 by RT PCR (hospital order, performed in [facility] hospital lab) *cepheid single result test* Anterior Nasal Swab     Status: None   Collection Time: 07/07/2022 12:07 PM   Specimen: Anterior Nasal Swab  Result Value Ref Range Status   SARS Coronavirus 2 by RT PCR NEGATIVE NEGATIVE Final    Comment: (NOTE) SARS-CoV-2 target nucleic acids are NOT DETECTED.  The SARS-CoV-2 RNA is generally detectable in upper and lower respiratory specimens during the acute phase of infection. The lowest concentration of SARS-CoV-2 viral copies this assay can detect is 250 copies / mL. A negative result does not preclude SARS-CoV-2 infection and should not be used as the sole basis for treatment or other patient management decisions.  A negative result may occur with improper specimen collection / handling,  submission of specimen other than nasopharyngeal swab, presence of viral mutation(s) within the areas targeted by this assay, and inadequate number of viral copies (<250 copies / mL). A negative result must be combined with clinical observations, patient history, and epidemiological information.  Fact Sheet for Patients:   https://www.patel.info/  Fact Sheet for Healthcare Providers: https://hall.com/  This test is not yet approved or  cleared by the [place] FDA and has been authorized for detection and/or diagnosis of SARS-CoV-2 by FDA under an Emergency Use Authorization (EUA).  This EUA will remain in effect (meaning this test can be used) for the duration of the COVID-19 declaration under Section 564(b)(1) of the Act, 21 U.S.C. section 360bbb-3(b)(1), unless the authorization is terminated or revoked sooner.  Performed at [facility], [address] [place], [place] [address]   MRSA Next Gen by PCR, Nasal     Status: None   Collection Time: 07/07/2022 12:58 PM   Specimen: Nasal Mucosa; Nasal Swab  Result Value Ref Range Status   MRSA by PCR Next Gen NOT DETECTED NOT DETECTED Final    Comment: (NOTE) The GeneXpert MRSA Assay (FDA approved for NASAL specimens only), is one component of a comprehensive MRSA colonization surveillance program. It is not intended to diagnose MRSA infection nor to guide or monitor treatment for MRSA infections. Test performance is not FDA approved in patients less than 8 years old. Performed at [facility], [phone]  [address] [place], [place] [address]   Aerobic/Anaerobic Culture w Gram Stain (surgical/deep wound)     Status: None   Collection Time: 07/10/2022  2:43 PM   Specimen: BILE  Result Value Ref Range Status   Specimen Description   Final    BILE Performed at [facility], [address], [place], [place] [address]    Special Requests   Final     NONE Performed at [facility], [place], [place] [address]    Gram Stain NO WBC SEEN FEW GRAM NEGATIVE RODS   Final   Culture   Final    FEW KLEBSIELLA OXYTOCA ABUNDANT ENTEROCOCCUS RAFFINOSUS MODERATE BACTEROIDES CACCAE BETA LACTAMASE POSITIVE Performed at [facility], [place] [address], [place], [place] [address]    Report Status 06/26/2022 FINAL  Final   Organism ID, Bacteria KLEBSIELLA OXYTOCA  Final   Organism ID, Bacteria ENTEROCOCCUS RAFFINOSUS  Final      Susceptibility   Klebsiella oxytoca - MIC*    AMPICILLIN RESISTANT Resistant     CEFAZOLIN <=4 SENSITIVE Sensitive     CEFEPIME <=0.12 SENSITIVE Sensitive     CEFTAZIDIME <=1 SENSITIVE Sensitive     CEFTRIAXONE <=0.25 SENSITIVE Sensitive     CIPROFLOXACIN <=0.25 SENSITIVE Sensitive     GENTAMICIN <=1 SENSITIVE Sensitive     IMIPENEM <=0.25 SENSITIVE Sensitive     TRIMETH/SULFA <=20 SENSITIVE Sensitive     AMPICILLIN/SULBACTAM <=2 SENSITIVE Sensitive     PIP/TAZO <=4 SENSITIVE Sensitive     * FEW KLEBSIELLA OXYTOCA   Enterococcus raffinosus - MIC*    AMPICILLIN 16 RESISTANT Resistant     VANCOMYCIN <=0.5 SENSITIVE Sensitive     GENTAMICIN SYNERGY SENSITIVE Sensitive     * ABUNDANT ENTEROCOCCUS RAFFINOSUS    Time spent: 30 minutes  Signed: Lorella Nimrod, MD 07/14/22

## 2022-07-11 DEATH — deceased

## 2022-11-21 ENCOUNTER — Ambulatory Visit: Payer: Medicare Other | Admitting: Cardiology
